# Patient Record
Sex: Female | Born: 1940
Health system: Southern US, Community
[De-identification: ages and names within clinical notes are randomized; demographics above are authoritative.]

## PROBLEM LIST (undated history)

## (undated) DIAGNOSIS — J181 Lobar pneumonia, unspecified organism: Secondary | ICD-10-CM

## (undated) DIAGNOSIS — C9190 Lymphoid leukemia, unspecified not having achieved remission: Secondary | ICD-10-CM

## (undated) DIAGNOSIS — K649 Unspecified hemorrhoids: Secondary | ICD-10-CM

## (undated) DIAGNOSIS — F329 Major depressive disorder, single episode, unspecified: Secondary | ICD-10-CM

## (undated) DIAGNOSIS — I10 Essential (primary) hypertension: Secondary | ICD-10-CM

## (undated) DIAGNOSIS — Z9221 Personal history of antineoplastic chemotherapy: Secondary | ICD-10-CM

## (undated) DIAGNOSIS — F419 Anxiety disorder, unspecified: Secondary | ICD-10-CM

## (undated) DIAGNOSIS — F32A Depression, unspecified: Secondary | ICD-10-CM

## (undated) DIAGNOSIS — K219 Gastro-esophageal reflux disease without esophagitis: Secondary | ICD-10-CM

## (undated) DIAGNOSIS — M8580 Other specified disorders of bone density and structure, unspecified site: Secondary | ICD-10-CM

## (undated) DIAGNOSIS — T7840XA Allergy, unspecified, initial encounter: Secondary | ICD-10-CM

## (undated) HISTORY — DX: Allergy, unspecified, initial encounter: T78.40XA

## (undated) HISTORY — PX: APPENDECTOMY: SHX54

## (undated) HISTORY — DX: Major depressive disorder, single episode, unspecified: F32.9

## (undated) HISTORY — PX: OOPHORECTOMY: SHX86

## (undated) HISTORY — PX: ABDOMINAL HYSTERECTOMY: SHX81

## (undated) HISTORY — DX: Gastro-esophageal reflux disease without esophagitis: K21.9

## (undated) HISTORY — DX: Other specified disorders of bone density and structure, unspecified site: M85.80

## (undated) HISTORY — DX: Essential (primary) hypertension: I10

## (undated) HISTORY — PX: COLONOSCOPY: SHX174

## (undated) HISTORY — PX: BREAST BIOPSY: SHX20

## (undated) HISTORY — DX: Lymphoid leukemia, unspecified not having achieved remission: C91.90

## (undated) HISTORY — PX: SPINE SURGERY: SHX786

## (undated) HISTORY — DX: Lobar pneumonia, unspecified organism: J18.1

## (undated) HISTORY — PX: COLON SURGERY: SHX602

## (undated) HISTORY — DX: Depression, unspecified: F32.A

## (undated) HISTORY — DX: Unspecified hemorrhoids: K64.9

---

## 2003-10-23 ENCOUNTER — Ambulatory Visit (HOSPITAL_COMMUNITY): Admission: RE | Admit: 2003-10-23 | Discharge: 2003-10-24 | Payer: Self-pay | Admitting: Neurosurgery

## 2004-04-14 ENCOUNTER — Ambulatory Visit: Payer: Self-pay | Admitting: Internal Medicine

## 2004-04-17 ENCOUNTER — Ambulatory Visit: Payer: Self-pay | Admitting: Internal Medicine

## 2004-05-07 ENCOUNTER — Ambulatory Visit: Payer: Self-pay | Admitting: Internal Medicine

## 2004-06-18 ENCOUNTER — Ambulatory Visit: Payer: Self-pay | Admitting: Oncology

## 2004-06-22 ENCOUNTER — Ambulatory Visit: Payer: Self-pay | Admitting: Oncology

## 2004-09-23 ENCOUNTER — Inpatient Hospital Stay: Payer: Self-pay | Admitting: General Surgery

## 2005-03-18 ENCOUNTER — Ambulatory Visit: Payer: Self-pay | Admitting: Oncology

## 2005-03-25 ENCOUNTER — Ambulatory Visit: Payer: Self-pay | Admitting: Oncology

## 2005-10-01 ENCOUNTER — Ambulatory Visit: Payer: Self-pay | Admitting: General Surgery

## 2005-12-13 ENCOUNTER — Ambulatory Visit: Payer: Self-pay | Admitting: Family Medicine

## 2005-12-14 ENCOUNTER — Ambulatory Visit: Payer: Self-pay | Admitting: Family Medicine

## 2005-12-23 ENCOUNTER — Ambulatory Visit: Payer: Self-pay | Admitting: Oncology

## 2006-01-22 ENCOUNTER — Ambulatory Visit: Payer: Self-pay | Admitting: Oncology

## 2006-09-23 ENCOUNTER — Ambulatory Visit: Payer: Self-pay | Admitting: Oncology

## 2006-10-05 ENCOUNTER — Ambulatory Visit: Payer: Self-pay | Admitting: Oncology

## 2006-10-17 ENCOUNTER — Emergency Department: Payer: Self-pay | Admitting: Emergency Medicine

## 2006-10-24 ENCOUNTER — Ambulatory Visit: Payer: Self-pay | Admitting: Oncology

## 2006-11-10 ENCOUNTER — Ambulatory Visit: Payer: Self-pay | Admitting: Gastroenterology

## 2007-05-24 ENCOUNTER — Ambulatory Visit: Payer: Self-pay | Admitting: Oncology

## 2007-06-19 ENCOUNTER — Ambulatory Visit: Payer: Self-pay | Admitting: Oncology

## 2007-06-23 ENCOUNTER — Ambulatory Visit: Payer: Self-pay | Admitting: Oncology

## 2007-12-01 ENCOUNTER — Ambulatory Visit: Payer: Self-pay | Admitting: General Surgery

## 2008-02-23 ENCOUNTER — Ambulatory Visit: Payer: Self-pay | Admitting: Oncology

## 2008-03-19 ENCOUNTER — Ambulatory Visit: Payer: Self-pay | Admitting: Oncology

## 2008-03-25 ENCOUNTER — Ambulatory Visit: Payer: Self-pay | Admitting: Oncology

## 2008-11-22 ENCOUNTER — Ambulatory Visit: Payer: Self-pay | Admitting: Oncology

## 2008-12-03 ENCOUNTER — Ambulatory Visit: Payer: Self-pay | Admitting: Oncology

## 2008-12-23 ENCOUNTER — Ambulatory Visit: Payer: Self-pay | Admitting: Oncology

## 2009-07-23 ENCOUNTER — Ambulatory Visit: Payer: Self-pay | Admitting: Oncology

## 2009-08-12 ENCOUNTER — Ambulatory Visit: Payer: Self-pay | Admitting: Oncology

## 2009-08-22 ENCOUNTER — Ambulatory Visit: Payer: Self-pay | Admitting: Oncology

## 2009-09-22 ENCOUNTER — Ambulatory Visit: Payer: Self-pay | Admitting: Oncology

## 2009-11-13 ENCOUNTER — Ambulatory Visit: Payer: Self-pay | Admitting: Family Medicine

## 2009-11-27 ENCOUNTER — Ambulatory Visit: Payer: Self-pay | Admitting: Family Medicine

## 2010-05-20 ENCOUNTER — Ambulatory Visit: Payer: Self-pay | Admitting: Oncology

## 2010-05-24 ENCOUNTER — Ambulatory Visit: Payer: Self-pay | Admitting: Oncology

## 2010-12-03 ENCOUNTER — Ambulatory Visit: Payer: Self-pay | Admitting: Family Medicine

## 2011-05-20 ENCOUNTER — Ambulatory Visit: Payer: Self-pay | Admitting: Oncology

## 2011-06-16 ENCOUNTER — Ambulatory Visit: Payer: Self-pay | Admitting: Family Medicine

## 2011-06-16 ENCOUNTER — Emergency Department: Payer: Self-pay | Admitting: Emergency Medicine

## 2011-06-16 LAB — DIFFERENTIAL
Basophil #: 0 10*3/uL (ref 0.0–0.1)
Basophil %: 0.2 %
Comment - H1-Com1: NORMAL
Comment - H1-Com2: NORMAL
Eosinophil #: 0 10*3/uL (ref 0.0–0.7)
Eosinophil %: 0.1 %
Eosinophil: 1 %
Lymphocyte #: 23 10*3/uL — ABNORMAL HIGH (ref 1.0–3.6)
Lymphocyte %: 76.1 %
Lymphocytes: 45 %
Monocyte #: 0.5 x10 3/mm (ref 0.2–0.9)
Monocyte %: 1.8 %
Monocytes: 6 %
Neutrophil #: 6.6 10*3/uL — ABNORMAL HIGH (ref 1.4–6.5)
Neutrophil %: 21.8 %
Segmented Neutrophils: 24 %
Variant Lymphocyte - H1-Rlymph: 24 %

## 2011-06-16 LAB — COMPREHENSIVE METABOLIC PANEL
Albumin: 3.8 g/dL (ref 3.4–5.0)
Alkaline Phosphatase: 73 U/L (ref 50–136)
Anion Gap: 9 (ref 7–16)
BUN: 8 mg/dL (ref 7–18)
Bilirubin,Total: 0.5 mg/dL (ref 0.2–1.0)
Calcium, Total: 8.3 mg/dL — ABNORMAL LOW (ref 8.5–10.1)
Chloride: 110 mmol/L — ABNORMAL HIGH (ref 98–107)
Co2: 23 mmol/L (ref 21–32)
Creatinine: 0.6 mg/dL (ref 0.60–1.30)
EGFR (African American): >60
EGFR (Non-African Amer.): >60
Glucose: 94 mg/dL (ref 65–99)
Osmolality: 281 (ref 275–301)
Potassium: 4 mmol/L (ref 3.5–5.1)
SGOT(AST): 25 U/L (ref 15–37)
SGPT (ALT): 21 U/L
Sodium: 142 mmol/L (ref 136–145)
Total Protein: 6.1 g/dL — ABNORMAL LOW (ref 6.4–8.2)

## 2011-06-16 LAB — CBC
HCT: 48.8 % — ABNORMAL HIGH (ref 35.0–47.0)
HGB: 16.1 g/dL — ABNORMAL HIGH (ref 12.0–16.0)
MCH: 33.1 pg (ref 26.0–34.0)
MCHC: 32.9 g/dL (ref 32.0–36.0)
MCV: 101 fL — ABNORMAL HIGH (ref 80–100)
Platelet: 138 10*3/uL — ABNORMAL LOW (ref 150–440)
RBC: 4.86 10*6/uL (ref 3.80–5.20)
RDW: 12.7 % (ref 11.5–14.5)
WBC: 30.1 10*3/uL — ABNORMAL HIGH (ref 3.6–11.0)

## 2011-06-16 LAB — TROPONIN I: Troponin-I: <0.02 ng/mL

## 2011-06-18 ENCOUNTER — Ambulatory Visit: Payer: Self-pay | Admitting: Oncology

## 2011-06-18 LAB — CBC CANCER CENTER
Basophil #: 0.1 x10 3/mm (ref 0.0–0.1)
Basophil %: 0.4 %
Eosinophil #: 0.1 x10 3/mm (ref 0.0–0.7)
Eosinophil %: 0.2 %
HCT: 45.1 % (ref 35.0–47.0)
HGB: 15.1 g/dL (ref 12.0–16.0)
Lymphocyte #: 24.1 x10 3/mm — ABNORMAL HIGH (ref 1.0–3.6)
Lymphocyte %: 73.5 %
MCH: 33.5 pg (ref 26.0–34.0)
MCHC: 33.5 g/dL (ref 32.0–36.0)
MCV: 100 fL (ref 80–100)
Monocyte #: 0.7 x10 3/mm (ref 0.2–0.9)
Monocyte %: 2.2 %
Neutrophil #: 7.8 x10 3/mm — ABNORMAL HIGH (ref 1.4–6.5)
Neutrophil %: 23.7 %
Platelet: 148 x10 3/mm — ABNORMAL LOW (ref 150–440)
RBC: 4.51 10*6/uL (ref 3.80–5.20)
RDW: 12.8 % (ref 11.5–14.5)
WBC: 32.8 x10 3/mm — ABNORMAL HIGH (ref 3.6–11.0)

## 2011-06-18 LAB — COMPREHENSIVE METABOLIC PANEL
Albumin: 4.3 g/dL (ref 3.4–5.0)
Alkaline Phosphatase: 103 U/L (ref 50–136)
Anion Gap: 7 (ref 7–16)
BUN: 11 mg/dL (ref 7–18)
Bilirubin,Total: 0.9 mg/dL (ref 0.2–1.0)
Calcium, Total: 9.6 mg/dL (ref 8.5–10.1)
Chloride: 100 mmol/L (ref 98–107)
Co2: 32 mmol/L (ref 21–32)
Creatinine: 0.88 mg/dL (ref 0.60–1.30)
EGFR (African American): >60
EGFR (Non-African Amer.): >60
Glucose: 119 mg/dL — ABNORMAL HIGH (ref 65–99)
Osmolality: 278 (ref 275–301)
Potassium: 4.2 mmol/L (ref 3.5–5.1)
SGOT(AST): 15 U/L (ref 15–37)
SGPT (ALT): 23 U/L
Sodium: 139 mmol/L (ref 136–145)
Total Protein: 6.8 g/dL (ref 6.4–8.2)

## 2011-06-23 ENCOUNTER — Ambulatory Visit: Payer: Self-pay | Admitting: Oncology

## 2011-12-09 ENCOUNTER — Ambulatory Visit: Payer: Self-pay | Admitting: Family Medicine

## 2012-05-28 ENCOUNTER — Ambulatory Visit: Payer: Self-pay

## 2012-05-28 LAB — RAPID INFLUENZA A&B ANTIGENS

## 2012-05-28 LAB — RAPID STREP-A WITH REFLX: Micro Text Report: NEGATIVE

## 2012-05-31 LAB — BETA STREP CULTURE(ARMC)

## 2012-07-14 ENCOUNTER — Ambulatory Visit: Payer: Self-pay | Admitting: Oncology

## 2012-07-14 LAB — COMPREHENSIVE METABOLIC PANEL
Albumin: 4 g/dL (ref 3.4–5.0)
Alkaline Phosphatase: 90 U/L (ref 50–136)
Anion Gap: 9 (ref 7–16)
BUN: 14 mg/dL (ref 7–18)
Bilirubin,Total: 0.6 mg/dL (ref 0.2–1.0)
Calcium, Total: 8.9 mg/dL (ref 8.5–10.1)
Chloride: 105 mmol/L (ref 98–107)
Co2: 28 mmol/L (ref 21–32)
Creatinine: 0.8 mg/dL (ref 0.60–1.30)
EGFR (African American): >60
EGFR (Non-African Amer.): >60
Glucose: 106 mg/dL — ABNORMAL HIGH (ref 65–99)
Osmolality: 284 (ref 275–301)
Potassium: 4.4 mmol/L (ref 3.5–5.1)
SGOT(AST): 16 U/L (ref 15–37)
SGPT (ALT): 24 U/L (ref 12–78)
Sodium: 142 mmol/L (ref 136–145)
Total Protein: 6.4 g/dL (ref 6.4–8.2)

## 2012-07-14 LAB — CBC CANCER CENTER
Basophil #: 0.1 x10 3/mm (ref 0.0–0.1)
Basophil %: 0.5 %
Eosinophil #: 0.2 x10 3/mm (ref 0.0–0.7)
Eosinophil %: 0.9 %
HCT: 45.2 % (ref 35.0–47.0)
HGB: 15.1 g/dL (ref 12.0–16.0)
Lymphocyte #: 19.7 x10 3/mm — ABNORMAL HIGH (ref 1.0–3.6)
Lymphocyte %: 86.2 %
MCH: 32.7 pg (ref 26.0–34.0)
MCHC: 33.4 g/dL (ref 32.0–36.0)
MCV: 98 fL (ref 80–100)
Monocyte #: 0.1 x10 3/mm — ABNORMAL LOW (ref 0.2–0.9)
Monocyte %: 0.4 %
Neutrophil #: 2.7 x10 3/mm (ref 1.4–6.5)
Neutrophil %: 12 %
Platelet: 151 x10 3/mm (ref 150–440)
RBC: 4.61 10*6/uL (ref 3.80–5.20)
RDW: 12.6 % (ref 11.5–14.5)
WBC: 22.8 x10 3/mm — ABNORMAL HIGH (ref 3.6–11.0)

## 2012-07-23 ENCOUNTER — Ambulatory Visit: Payer: Self-pay | Admitting: Oncology

## 2012-12-15 ENCOUNTER — Ambulatory Visit: Payer: Self-pay | Admitting: Family Medicine

## 2013-03-12 ENCOUNTER — Ambulatory Visit: Payer: Self-pay | Admitting: Physician Assistant

## 2013-07-18 ENCOUNTER — Ambulatory Visit: Payer: Self-pay | Admitting: Oncology

## 2013-08-08 ENCOUNTER — Ambulatory Visit: Payer: Self-pay | Admitting: Physician Assistant

## 2013-08-08 LAB — RAPID STREP-A WITH REFLX: Micro Text Report: NEGATIVE

## 2013-08-10 ENCOUNTER — Ambulatory Visit: Payer: Self-pay | Admitting: Oncology

## 2013-08-10 LAB — CBC CANCER CENTER
Basophil #: 0.1 x10 3/mm (ref 0.0–0.1)
Basophil %: 0.4 %
Eosinophil #: 0.2 x10 3/mm (ref 0.0–0.7)
Eosinophil %: 1 %
HCT: 45.4 % (ref 35.0–47.0)
HGB: 15.1 g/dL (ref 12.0–16.0)
Lymphocyte #: 17.9 x10 3/mm — ABNORMAL HIGH (ref 1.0–3.6)
Lymphocyte %: 80.5 %
MCH: 33.4 pg (ref 26.0–34.0)
MCHC: 33.3 g/dL (ref 32.0–36.0)
MCV: 100 fL (ref 80–100)
Monocyte #: 0.6 x10 3/mm (ref 0.2–0.9)
Monocyte %: 2.8 %
Neutrophil #: 3.4 x10 3/mm (ref 1.4–6.5)
Neutrophil %: 15.3 %
Platelet: 148 x10 3/mm — ABNORMAL LOW (ref 150–440)
RBC: 4.53 10*6/uL (ref 3.80–5.20)
RDW: 12.6 % (ref 11.5–14.5)
WBC: 22.3 x10 3/mm — ABNORMAL HIGH (ref 3.6–11.0)

## 2013-08-10 LAB — COMPREHENSIVE METABOLIC PANEL
Albumin: 3.9 g/dL (ref 3.4–5.0)
Alkaline Phosphatase: 93 U/L
Anion Gap: 6 — ABNORMAL LOW (ref 7–16)
BUN: 12 mg/dL (ref 7–18)
Bilirubin,Total: 0.6 mg/dL (ref 0.2–1.0)
Calcium, Total: 9.2 mg/dL (ref 8.5–10.1)
Chloride: 102 mmol/L (ref 98–107)
Co2: 33 mmol/L — ABNORMAL HIGH (ref 21–32)
Creatinine: 0.95 mg/dL (ref 0.60–1.30)
EGFR (African American): >60
EGFR (Non-African Amer.): 60 — ABNORMAL LOW
Glucose: 110 mg/dL — ABNORMAL HIGH (ref 65–99)
Osmolality: 282 (ref 275–301)
Potassium: 4.1 mmol/L (ref 3.5–5.1)
SGOT(AST): 19 U/L (ref 15–37)
SGPT (ALT): 24 U/L (ref 12–78)
Sodium: 141 mmol/L (ref 136–145)
Total Protein: 6.8 g/dL (ref 6.4–8.2)

## 2013-08-10 LAB — BETA STREP CULTURE(ARMC)

## 2013-08-22 ENCOUNTER — Ambulatory Visit: Payer: Self-pay | Admitting: Oncology

## 2013-12-18 ENCOUNTER — Ambulatory Visit: Payer: Self-pay | Admitting: Family Medicine

## 2013-12-27 ENCOUNTER — Ambulatory Visit: Payer: Self-pay | Admitting: Family Medicine

## 2014-08-06 ENCOUNTER — Other Ambulatory Visit: Payer: Self-pay | Admitting: *Deleted

## 2014-08-06 DIAGNOSIS — C9110 Chronic lymphocytic leukemia of B-cell type not having achieved remission: Secondary | ICD-10-CM

## 2014-08-09 ENCOUNTER — Ambulatory Visit (HOSPITAL_BASED_OUTPATIENT_CLINIC_OR_DEPARTMENT_OTHER): Payer: Medicare HMO | Admitting: Oncology

## 2014-08-09 ENCOUNTER — Inpatient Hospital Stay: Payer: Medicare HMO | Attending: Oncology

## 2014-08-09 ENCOUNTER — Other Ambulatory Visit: Payer: Self-pay

## 2014-08-09 VITALS — BP 140/65 | HR 84 | Temp 96.3°F | Resp 18 | Wt 176.2 lb

## 2014-08-09 DIAGNOSIS — F17210 Nicotine dependence, cigarettes, uncomplicated: Secondary | ICD-10-CM | POA: Insufficient documentation

## 2014-08-09 DIAGNOSIS — C9110 Chronic lymphocytic leukemia of B-cell type not having achieved remission: Secondary | ICD-10-CM | POA: Insufficient documentation

## 2014-08-09 DIAGNOSIS — K219 Gastro-esophageal reflux disease without esophagitis: Secondary | ICD-10-CM | POA: Diagnosis not present

## 2014-08-09 DIAGNOSIS — M8580 Other specified disorders of bone density and structure, unspecified site: Secondary | ICD-10-CM

## 2014-08-09 DIAGNOSIS — Z79899 Other long term (current) drug therapy: Secondary | ICD-10-CM | POA: Insufficient documentation

## 2014-08-09 DIAGNOSIS — I10 Essential (primary) hypertension: Secondary | ICD-10-CM | POA: Diagnosis not present

## 2014-08-09 LAB — CBC WITH DIFFERENTIAL/PLATELET
Basophils Absolute: 0.2 10*3/uL — ABNORMAL HIGH (ref 0–0.1)
Basophils Relative: 0 %
Eosinophils Absolute: 0.2 10*3/uL (ref 0–0.7)
Eosinophils Relative: 1 %
HCT: 47 % (ref 35.0–47.0)
Hemoglobin: 15.8 g/dL (ref 12.0–16.0)
Lymphocytes Relative: 90 %
Lymphs Abs: 36.3 10*3/uL — ABNORMAL HIGH (ref 1.0–3.6)
MCH: 32.9 pg (ref 26.0–34.0)
MCHC: 33.6 g/dL (ref 32.0–36.0)
MCV: 98 fL (ref 80.0–100.0)
Monocytes Absolute: 0.3 10*3/uL (ref 0.2–0.9)
Monocytes Relative: 1 %
Neutro Abs: 3.1 10*3/uL (ref 1.4–6.5)
Neutrophils Relative %: 8 %
Platelets: 138 10*3/uL — ABNORMAL LOW (ref 150–440)
RBC: 4.8 MIL/uL (ref 3.80–5.20)
RDW: 12.9 % (ref 11.5–14.5)
WBC: 40.1 10*3/uL — ABNORMAL HIGH (ref 3.6–11.0)

## 2014-08-09 LAB — COMPREHENSIVE METABOLIC PANEL
ALT: 16 U/L (ref 14–54)
AST: 22 U/L (ref 15–41)
Albumin: 4.8 g/dL (ref 3.5–5.0)
Alkaline Phosphatase: 92 U/L (ref 38–126)
Anion gap: 7 (ref 5–15)
BUN: 14 mg/dL (ref 6–20)
CO2: 31 mmol/L (ref 22–32)
Calcium: 9.7 mg/dL (ref 8.9–10.3)
Chloride: 103 mmol/L (ref 101–111)
Creatinine, Ser: 0.85 mg/dL (ref 0.44–1.00)
GFR calc Af Amer: >60 mL/min (ref >60)
GFR calc non Af Amer: >60 mL/min (ref >60)
Glucose, Bld: 123 mg/dL — ABNORMAL HIGH (ref 65–99)
Potassium: 4.4 mmol/L (ref 3.5–5.1)
Sodium: 141 mmol/L (ref 135–145)
Total Bilirubin: 1 mg/dL (ref 0.3–1.2)
Total Protein: 7 g/dL (ref 6.5–8.1)

## 2014-08-09 LAB — LACTATE DEHYDROGENASE: LDH: 152 U/L (ref 98–192)

## 2014-08-19 ENCOUNTER — Other Ambulatory Visit: Payer: Self-pay

## 2014-08-19 ENCOUNTER — Ambulatory Visit: Payer: Self-pay | Admitting: Oncology

## 2014-08-26 NOTE — Progress Notes (Signed)
[place]  [phone] [phone]) [phone]  ID: Jakyria Bleau OB: [birth year]  MR#: [id number]  AOZ#:[id number]  Patient Care Team: [name], MD as PCP - Family Medicine (Family Medicine)  CHIEF COMPLAINT:  Chief Complaint  Patient presents with  . Follow-up    CLL     INTERVAL HISTORY: Patient returns to clinic today for routine yearly follow-up and laboratory work. She continues to feel well and remains asymptomatic. She denies any recent fevers or illnesses. She has a good appetite and denies weight loss. She denies any night sweats. She has no chest pain or shortness of breath. She denies any nausea, vomiting, constipation, or diarrhea. Patient feels at her baseline and offers no specific complaints today.  REVIEW OF SYSTEMS:   Review of Systems  Constitutional: Negative for fever, weight loss and diaphoresis.  Respiratory: Negative.   Cardiovascular: Negative.     As per HPI. Otherwise, a complete review of systems is negatve.  PAST MEDICAL HISTORY: Past Medical History  Diagnosis Date  . Leukemia, lymphoid   . GERD (gastroesophageal reflux disease)   . Hypertension   . Allergy   . Depression   . Osteopenia   . Lobar pneumonia     PAST SURGICAL HISTORY: Past Surgical History  Procedure Laterality Date  . Appendectomy    . Abdominal hysterectomy    . Colon surgery      sigmoid resection  . Spine surgery      L4-5    FAMILY HISTORY Family History  Problem Relation Age of Onset  . Osteoporosis Mother   . Hypertension Father   . Heart attack Father   . Stroke Maternal Grandfather        ADVANCED DIRECTIVES:    HEALTH MAINTENANCE: History  Substance Use Topics  . Smoking status: Current Every Day Smoker    Types: Cigarettes  . Smokeless tobacco: Not on file  . Alcohol Use: No     Colonoscopy:  PAP:  Bone density:  Lipid panel:  Allergies  Allergen Reactions  . Augmentin [Amoxicillin-Pot  Clavulanate] Swelling    tongue  . Biaxin [Clarithromycin] Swelling and Rash    tongue    Current Outpatient Prescriptions  Medication Sig Dispense Refill  . albuterol (PROVENTIL HFA;VENTOLIN HFA) 108 (90 BASE) MCG/ACT inhaler Inhale 2 puffs into the lungs every 6 (six) hours as needed for wheezing or shortness of breath.    . benazepril (LOTENSIN) 40 MG tablet Take 40 mg by mouth daily.    Marland Kitchen LORazepam (ATIVAN) 1 MG tablet Take 1 mg by mouth 2 (two) times daily as needed for anxiety.    Marland Kitchen omeprazole (PRILOSEC) 20 MG capsule Take 20 mg by mouth daily as needed.    Marland Kitchen PARoxetine (PAXIL) 30 MG tablet Take 30 mg by mouth daily.    . Triamcinolone Acetonide (NASACORT AQ NA) Place into the nose daily.     No current facility-administered medications for this visit.    OBJECTIVE: Filed Vitals:   08/09/14 1139  BP: 140/65  Pulse: 84  Temp: 96.3 F (35.7 C)  Resp: 18     Body mass index is 27.58 kg/(m^2).    ECOG FS:0 - Asymptomatic  General: Well-developed, well-nourished, no acute distress. Eyes: anicteric sclera. Lungs: Clear to auscultation bilaterally. Heart: Regular rate and rhythm. No rubs, murmurs, or gallops. Abdomen: Soft, nontender, nondistended. No organomegaly noted, normoactive bowel sounds. Musculoskeletal: No edema, cyanosis, or clubbing. Neuro: Alert, answering all questions appropriately. Cranial nerves grossly  intact. Skin: No rashes or petechiae noted. Psych: Normal affect. Lymphatics: No cervical, calvicular, axillary or inguinal LAD.   LAB RESULTS:  Lab Results  Component Value Date   NA 141 08/09/2014   K 4.4 08/09/2014   CL 103 08/09/2014   CO2 31 08/09/2014   GLUCOSE 123* 08/09/2014   BUN 14 08/09/2014   CREATININE 0.85 08/09/2014   CALCIUM 9.7 08/09/2014   PROT 7.0 08/09/2014   ALBUMIN 4.8 08/09/2014   AST 22 08/09/2014   ALT 16 08/09/2014   ALKPHOS 92 08/09/2014   BILITOT 1.0 08/09/2014   GFRNONAA >60 08/09/2014   GFRAA >60 08/09/2014     Lab Results  Component Value Date   WBC 40.1* 08/09/2014   NEUTROABS 3.1 08/09/2014   HGB 15.8 08/09/2014   HCT 47.0 08/09/2014   MCV 98.0 08/09/2014   PLT 138* 08/09/2014     STUDIES: No results found.  ASSESSMENT: CLL, Rai stage 0  PLAN:    1. CLL: Patient's white count remains elevated, but essentially unchanged. She continues to remain a stage 0. She does not require treatment at this time. Patient continues to requests minimal follow-up therefore will return to clinic in 1 year for repeat laboratory work and further evaluation.  Patient expressed understanding and was in agreement with this plan. She also understands that She can call clinic at any time with any questions, concerns, or complaints.   Lloyd Huger, MD   08/26/2014 2:12 PM

## 2014-09-22 ENCOUNTER — Ambulatory Visit
Admission: EM | Admit: 2014-09-22 | Discharge: 2014-09-22 | Disposition: A | Discharge status: Home / Self Care | Payer: Medicare HMO | Attending: Emergency Medicine | Admitting: Emergency Medicine

## 2014-09-22 ENCOUNTER — Encounter: Payer: Self-pay | Admitting: *Deleted

## 2014-09-22 ENCOUNTER — Ambulatory Visit: Payer: Medicare HMO

## 2014-09-22 DIAGNOSIS — F17210 Nicotine dependence, cigarettes, uncomplicated: Secondary | ICD-10-CM | POA: Diagnosis not present

## 2014-09-22 DIAGNOSIS — K219 Gastro-esophageal reflux disease without esophagitis: Secondary | ICD-10-CM | POA: Diagnosis not present

## 2014-09-22 DIAGNOSIS — R05 Cough: Secondary | ICD-10-CM | POA: Diagnosis present

## 2014-09-22 DIAGNOSIS — J40 Bronchitis, not specified as acute or chronic: Secondary | ICD-10-CM | POA: Diagnosis not present

## 2014-09-22 DIAGNOSIS — F329 Major depressive disorder, single episode, unspecified: Secondary | ICD-10-CM | POA: Diagnosis not present

## 2014-09-22 DIAGNOSIS — M8580 Other specified disorders of bone density and structure, unspecified site: Secondary | ICD-10-CM | POA: Insufficient documentation

## 2014-09-22 DIAGNOSIS — J069 Acute upper respiratory infection, unspecified: Secondary | ICD-10-CM | POA: Diagnosis not present

## 2014-09-22 DIAGNOSIS — I10 Essential (primary) hypertension: Secondary | ICD-10-CM | POA: Insufficient documentation

## 2014-09-22 MED ORDER — IPRATROPIUM-ALBUTEROL 0.5-2.5 (3) MG/3ML IN SOLN
3.0000 mL | Freq: Once | RESPIRATORY_TRACT | Status: AC
  Administered 2014-09-22: 3 mL via RESPIRATORY_TRACT

## 2014-09-22 MED ORDER — PREDNISONE 50 MG PO TABS: ORAL_TABLET | ORAL | Status: DC

## 2014-09-22 MED ORDER — BENZONATATE 100 MG PO CAPS: 100.0000 mg | ORAL_CAPSULE | Freq: Three times a day (TID) | ORAL | Status: DC

## 2014-09-22 MED ORDER — ALBUTEROL SULFATE HFA 108 (90 BASE) MCG/ACT IN AERS: 1.0000 | INHALATION_SPRAY | Freq: Four times a day (QID) | RESPIRATORY_TRACT | Status: DC | PRN

## 2014-09-22 NOTE — Discharge Instructions (Signed)
Take two puffs from your albuterol inhaler every 4-6 hours. Finish the steroids unless your doctor tells you to stop. Start taking some plain guaifenisin/robitussin o keep the mucus thin so that you can cough it up Make sure you drink extra fluids. Return if you get worse, have a fever >100.4, or any other concerns.   Go to www.goodrx.com to look up your medications. This will give you a list of where you can find your prescriptions at the most affordable prices.

## 2014-09-22 NOTE — ED Provider Notes (Signed)
HPI  SUBJECTIVE:  Katherine [name] is a 74 y.o. female who presents with cough productive of whitish sputum, wheezing, rhinorrhea, postnasal drip, fatigue, malaise, feeling feverish with chills for 3 days. She has no documented fevers. She states that she is sleeping through the night without waking up coughing or short of breath.. There are no aggravating or alleviating factors. She tried Allegra-D one day, Benadryl the other day and increasing her fluids. No sore throat, ear pain, sinus pain/pressure, chest pain, shortness of breath, vomiting, body aches, rash. No sick contacts. No itchy, watery eyes, sneezing. No lower extremity edema, nocturia. She states that she has a history of seasonal allergies, but that this feels different. She is a smoker one pack a day for for over 10 years.  Past medical history of hypertension, reflux, tobacco use, leukemia in remission per patient, pneumonia, seasonal allergies. No history of diabetes,  CHF, asthma, emphysema, COPD. patient states that she does not have an albuterol inhaler at home.    Past Medical History  Diagnosis Date  . Leukemia, lymphoid   . GERD (gastroesophageal reflux disease)   . Hypertension   . Allergy   . Depression   . Osteopenia   . Lobar pneumonia     Past Surgical History  Procedure Laterality Date  . Appendectomy    . Abdominal hysterectomy    . Colon surgery      sigmoid resection  . Spine surgery      L4-5    Family History  Problem Relation Age of Onset  . Osteoporosis Mother   . Hypertension Father   . Heart attack Father   . Stroke Maternal Grandfather     History  Substance Use Topics  . Smoking status: Current Every Day Smoker    Types: Cigarettes  . Smokeless tobacco: Not on file  . Alcohol Use: No    No current facility-administered medications for this encounter.  Current outpatient prescriptions:  .  albuterol (PROVENTIL HFA;VENTOLIN HFA) 108 (90 BASE) MCG/ACT inhaler, Inhale 1-2 puffs  into the lungs every 6 (six) hours as needed for wheezing or shortness of breath. Dispense with aerochamber, Disp: 1 Inhaler, Rfl: 0 .  benazepril (LOTENSIN) 40 MG tablet, Take 40 mg by mouth daily., Disp: , Rfl:  .  benzonatate (TESSALON) 100 MG capsule, Take 1 capsule (100 mg total) by mouth every 8 (eight) hours., Disp: 21 capsule, Rfl: 0 .  LORazepam (ATIVAN) 1 MG tablet, Take 1 mg by mouth 2 (two) times daily as needed for anxiety., Disp: , Rfl:  .  omeprazole (PRILOSEC) 20 MG capsule, Take 20 mg by mouth daily as needed., Disp: , Rfl:  .  PARoxetine (PAXIL) 30 MG tablet, Take 30 mg by mouth daily., Disp: , Rfl:  .  predniSONE (DELTASONE) 50 MG tablet, 1 tab po on day 1, one tab po on day 2, 1/2 tab po on days 3 and 4, Disp: 3 tablet, Rfl: 0 .  Triamcinolone Acetonide (NASACORT AQ NA), Place into the nose daily., Disp: , Rfl:   Allergies  Allergen Reactions  . Augmentin [Amoxicillin-Pot Clavulanate] Swelling    tongue  . Biaxin [Clarithromycin] Swelling and Rash    tongue     ROS  As noted in HPI.   Physical Exam  BP 127/59 mmHg  Pulse 77  Temp(Src) 98.5 F (36.9 C)  Ht 5\' 7"  (1.702 m)  Wt 170 lb (77.111 kg)  BMI 26.62 kg/m2  SpO2 95%  Constitutional: Well developed, well nourished,  no acute distress Eyes: PERRL, EOMI, conjunctiva normal bilaterally TMs normal bilaterally. No sinus tenderness. mild nasal congestion. HENT: Normocephalic, atraumatic,mucus membranes moist Respiratory: Clear to auscultation bilaterally, no rales, no wheezing, no rhonchi Cardiovascular: Normal rate and rhythm, no murmurs, no gallops, no rubs GI: Soft, nondistended, normal bowel sounds, nontender, no rebound, no guarding Back: no CVAT skin: No rash, skin intact Musculoskeletal: No edema, no tenderness, no deformities Neurologic: Alert & oriented x 3, CN II-XII grossly intact, no motor deficits, sensation grossly intact Psychiatric: Speech and behavior appropriate   ED  Course   Medications  ipratropium-albuterol (DUONEB) 0.5-2.5 (3) MG/3ML nebulizer solution 3 mL (3 mLs Nebulization Given 09/22/14 0937)    Orders Placed This Encounter  Procedures  . DG Chest 2 View    Standing Status: Standing     Number of Occurrences: 1     Standing Expiration Date:     Order Specific Question:  Reason for Exam (SYMPTOM  OR DIAGNOSIS REQUIRED)    Answer:  cough r/o PNA     Comments:  r/o PNA   No results found for this or any previous visit (from the past 24 hour(s)). Dg Chest 2 View  09/22/2014   CLINICAL DATA:  Cough for 3 days.  Question pneumonia.  EXAM: CHEST  2 VIEW  COMPARISON:  12/27/2013  FINDINGS: Heart is normal size. Tortuosity of the thoracic aorta. Lungs are clear. No effusions. No acute bony abnormality.  IMPRESSION: No active cardiopulmonary disease.   Electronically Signed   By: Rolm Baptise M.D.   On: 09/22/2014 10:07   ED Clinical Impression  Bronchitis  URI (upper respiratory infection)   ED Assessment/Plan  Lungs clear on my exam, however, patient had some rhonchi in triage, so we'll give DuoNeb. Checking chest x-ray to rule out pneumonia. reviewed imaging independently. No pna. See radiology report for full details.  On reevaluation, patient states she feels slightly better. Lungs are still clear on repeat physical exam. Home with steroids, bronchodilators, Tessalon Perles, Mucinex, follow up with primary care physician or return here if not improving or getting worse. Discussed imaging, MDM, plan and followup with patient  Discussed sn/sx that should prompt return to the UC or ED. Patient agrees with plan.  *This clinic note was created using Dragon dictation software. Therefore, there may be occasional mistakes despite careful proofreading.  ?  Melynda Ripple, MD 09/22/14 1034

## 2014-09-22 NOTE — ED Notes (Signed)
Fever, cough and congestion x 3 days.  Few scattered rhonchi with inspiration.  No audible wheeezing.  Clear mucus.

## 2015-01-06 ENCOUNTER — Ambulatory Visit (INDEPENDENT_AMBULATORY_CARE_PROVIDER_SITE_OTHER): Payer: Medicare HMO | Admitting: Family Medicine

## 2015-01-06 ENCOUNTER — Other Ambulatory Visit: Payer: Self-pay | Admitting: Family Medicine

## 2015-01-06 ENCOUNTER — Encounter: Payer: Self-pay | Admitting: Family Medicine

## 2015-01-06 VITALS — BP 120/72 | HR 73 | Temp 98.9°F | Ht 66.6 in | Wt 171.0 lb

## 2015-01-06 DIAGNOSIS — I10 Essential (primary) hypertension: Secondary | ICD-10-CM

## 2015-01-06 DIAGNOSIS — Z0000 Encounter for general adult medical examination without abnormal findings: Secondary | ICD-10-CM

## 2015-01-06 DIAGNOSIS — Z23 Encounter for immunization: Secondary | ICD-10-CM | POA: Diagnosis not present

## 2015-01-06 DIAGNOSIS — C9110 Chronic lymphocytic leukemia of B-cell type not having achieved remission: Secondary | ICD-10-CM

## 2015-01-06 DIAGNOSIS — D692 Other nonthrombocytopenic purpura: Secondary | ICD-10-CM

## 2015-01-06 DIAGNOSIS — R69 Illness, unspecified: Secondary | ICD-10-CM | POA: Diagnosis not present

## 2015-01-06 DIAGNOSIS — Z1231 Encounter for screening mammogram for malignant neoplasm of breast: Secondary | ICD-10-CM

## 2015-01-06 DIAGNOSIS — F339 Major depressive disorder, recurrent, unspecified: Secondary | ICD-10-CM | POA: Insufficient documentation

## 2015-01-06 DIAGNOSIS — F329 Major depressive disorder, single episode, unspecified: Secondary | ICD-10-CM | POA: Diagnosis not present

## 2015-01-06 DIAGNOSIS — F32A Depression, unspecified: Secondary | ICD-10-CM

## 2015-01-06 DIAGNOSIS — L989 Disorder of the skin and subcutaneous tissue, unspecified: Secondary | ICD-10-CM

## 2015-01-06 LAB — URINALYSIS, ROUTINE W REFLEX MICROSCOPIC
Bilirubin, UA: NEGATIVE
Glucose, UA: NEGATIVE
Ketones, UA: NEGATIVE
Leukocytes, UA: NEGATIVE
Nitrite, UA: NEGATIVE
Protein, UA: NEGATIVE
RBC, UA: NEGATIVE
Specific Gravity, UA: 1.02 (ref 1.005–1.030)
Urobilinogen, Ur: 0.2 mg/dL (ref 0.2–1.0)
pH, UA: 6 (ref 5.0–7.5)

## 2015-01-06 MED ORDER — BENAZEPRIL HCL 40 MG PO TABS: 40.0000 mg | ORAL_TABLET | Freq: Every day | ORAL | Status: DC

## 2015-01-06 MED ORDER — PAROXETINE HCL 30 MG PO TABS: 30.0000 mg | ORAL_TABLET | Freq: Every day | ORAL | Status: DC

## 2015-01-06 MED ORDER — OMEPRAZOLE 20 MG PO CPDR: 20.0000 mg | DELAYED_RELEASE_CAPSULE | Freq: Every day | ORAL | Status: DC | PRN

## 2015-01-06 MED ORDER — LORAZEPAM 1 MG PO TABS: 0.5000 mg | ORAL_TABLET | Freq: Two times a day (BID) | ORAL | Status: DC | PRN

## 2015-01-06 NOTE — Assessment & Plan Note (Signed)
The current medical regimen is effective;  continue present plan and medications.  

## 2015-01-06 NOTE — Assessment & Plan Note (Signed)
Followed by hematology 

## 2015-01-06 NOTE — Progress Notes (Signed)
BP 120/72 mmHg  Pulse 73  Temp(Src) 98.9 F (37.2 C)  Ht 5' 6.6" (1.692 m)  Wt 171 lb (77.565 kg)  BMI 27.09 kg/m2  SpO2 98%   Subjective:    Patient ID: Katherine [name], female    DOB: Aug 25, 1940, 73 y.o.   MRN: [id number]  HPI: Katherine [name] is a 74 y.o. female  Chief Complaint  Patient presents with  . Annual Exam  rt leg inflamed nonhealing lesion occ scratches and bleeds Lasting all year Shingles spots are still sore on lt lat leg Chronic bruises on both arms Patient doing well follow-up hypertension complaints doing well on medication Anxiety depression stable using lorazepam 1 mg half a tablet morning and half in the evening Paxil still doing well with no complaints from medication   Relevant past medical, surgical, family and social history reviewed and updated as indicated. Interim medical history since our last visit reviewed. Allergies and medications reviewed and updated.  Review of Systems  Constitutional: Negative.   HENT: Negative.   Eyes: Negative.   Respiratory: Negative.   Cardiovascular: Negative.   Gastrointestinal: Negative.   Endocrine: Negative.   Genitourinary: Negative.   Musculoskeletal: Negative.   Skin: Negative.   Allergic/Immunologic: Negative.   Neurological: Negative.   Hematological: Negative.   Psychiatric/Behavioral: Negative.     Per HPI unless specifically indicated above     Objective:    BP 120/72 mmHg  Pulse 73  Temp(Src) 98.9 F (37.2 C)  Ht 5' 6.6" (1.692 m)  Wt 171 lb (77.565 kg)  BMI 27.09 kg/m2  SpO2 98%  Wt Readings from Last 3 Encounters:  01/06/15 171 lb (77.565 kg)  09/22/14 170 lb (77.111 kg)  08/09/14 176 lb 2.4 oz (79.901 kg)    Physical Exam  Constitutional: She is oriented to person, place, and time. She appears well-developed and well-nourished.  HENT:  Head: Normocephalic and atraumatic.  Right Ear: External ear normal.  Left Ear: External ear normal.  Nose: Nose normal.   Mouth/Throat: Oropharynx is clear and moist.  Eyes: Conjunctivae and EOM are normal. Pupils are equal, round, and reactive to light.  Neck: Normal range of motion. Neck supple. Carotid bruit is not present.  Cardiovascular: Normal rate, regular rhythm and normal heart sounds.   No murmur heard. Pulmonary/Chest: Effort normal and breath sounds normal. She exhibits no mass. Right breast exhibits no mass, no skin change and no tenderness. Left breast exhibits no mass, no skin change and no tenderness. Breasts are symmetrical.  Abdominal: Soft. Bowel sounds are normal. There is no hepatosplenomegaly.  Musculoskeletal: Normal range of motion.  Neurological: She is alert and oriented to person, place, and time.  Skin: No rash noted.  Senile purpura both arms  Psychiatric: She has a normal mood and affect. Her behavior is normal. Judgment and thought content normal.    Results for orders placed or performed in visit on 08/09/14  CBC with Differential/Platelet  Result Value Ref Range   WBC 40.1 (H) 3.6 - 11.0 K/uL   RBC 4.80 3.80 - 5.20 MIL/uL   Hemoglobin 15.8 12.0 - 16.0 g/dL   HCT 47.0 35.0 - 47.0 %   MCV 98.0 80.0 - 100.0 fL   MCH 32.9 26.0 - 34.0 pg   MCHC 33.6 32.0 - 36.0 g/dL   RDW 12.9 11.5 - 14.5 %   Platelets 138 (L) 150 - 440 K/uL   Neutrophils Relative % 8 %   Neutro Abs 3.1 1.4 - 6.5  K/uL   Lymphocytes Relative 90 %   Lymphs Abs 36.3 (H) 1.0 - 3.6 K/uL   Monocytes Relative 1 %   Monocytes Absolute 0.3 0.2 - 0.9 K/uL   Eosinophils Relative 1 %   Eosinophils Absolute 0.2 0 - 0.7 K/uL   Basophils Relative 0 %   Basophils Absolute 0.2 (H) 0 - 0.1 K/uL  Comprehensive metabolic panel  Result Value Ref Range   Sodium 141 135 - 145 mmol/L   Potassium 4.4 3.5 - 5.1 mmol/L   Chloride 103 101 - 111 mmol/L   CO2 31 22 - 32 mmol/L   Glucose, Bld 123 (H) 65 - 99 mg/dL   BUN 14 6 - 20 mg/dL   Creatinine, Ser 0.85 0.44 - 1.00 mg/dL   Calcium 9.7 8.9 - 10.3 mg/dL   Total Protein  7.0 6.5 - 8.1 g/dL   Albumin 4.8 3.5 - 5.0 g/dL   AST 22 15 - 41 U/L   ALT 16 14 - 54 U/L   Alkaline Phosphatase 92 38 - 126 U/L   Total Bilirubin 1.0 0.3 - 1.2 mg/dL   GFR calc non Af Amer >60 >60 mL/min   GFR calc Af Amer >60 >60 mL/min   Anion gap 7 5 - 15  Lactate dehydrogenase  Result Value Ref Range   LDH 152 98 - 192 U/L      Assessment & Plan:   Problem List Items Addressed This Visit      Cardiovascular and Mediastinum   Senile purpura (HCC)    Discussed nutrition      Relevant Medications   benazepril (LOTENSIN) 40 MG tablet   Essential hypertension    The current medical regimen is effective;  continue present plan and medications.       Relevant Medications   benazepril (LOTENSIN) 40 MG tablet   Other Relevant Orders   Comprehensive metabolic panel   Lipid panel   CBC with Differential/Platelet   Urinalysis, Routine w reflex microscopic (not at [facility])   TSH     Musculoskeletal and Integument   Skin lesion of right leg    Nonhealing skin lesion right leg occasionally bleeds will schedule shave biopsy        Other   Leukemia, lymphoid (HCC)    Followed by hematology      Relevant Medications   LORazepam (ATIVAN) 1 MG tablet   Other Relevant Orders   Comprehensive metabolic panel   Lipid panel   CBC with Differential/Platelet   Urinalysis, Routine w reflex microscopic (not at [facility])   TSH   Depression    The current medical regimen is effective;  continue present plan and medications.       Relevant Medications   LORazepam (ATIVAN) 1 MG tablet   PARoxetine (PAXIL) 30 MG tablet   Other Relevant Orders   Urinalysis, Routine w reflex microscopic (not at [facility])   TSH    Other Visit Diagnoses    Immunization due    -  Primary    Relevant Orders    Flu Vaccine QUAD 36+ mos PF IM (Fluarix & Fluzone Quad PF) (Completed)    PE (physical exam), annual            Follow up plan: Return for apt for shave bx leg lesion and f/u 6 mo reg visit,  BMP.

## 2015-01-06 NOTE — Assessment & Plan Note (Signed)
Nonhealing skin lesion right leg occasionally bleeds will schedule shave biopsy

## 2015-01-06 NOTE — Assessment & Plan Note (Signed)
Discussed nutrition.  

## 2015-01-07 ENCOUNTER — Telehealth: Payer: Self-pay | Admitting: Unknown Physician Specialty

## 2015-01-07 LAB — COMPREHENSIVE METABOLIC PANEL
ALT: 16 IU/L (ref 0–32)
AST: 18 IU/L (ref 0–40)
Albumin/Globulin Ratio: 3.1 — ABNORMAL HIGH (ref 1.1–2.5)
Albumin: 4.6 g/dL (ref 3.5–4.8)
Alkaline Phosphatase: 93 IU/L (ref 39–117)
BUN/Creatinine Ratio: 11 (ref 11–26)
BUN: 10 mg/dL (ref 8–27)
Bilirubin Total: 0.7 mg/dL (ref 0.0–1.2)
CO2: 25 mmol/L (ref 18–29)
Calcium: 9.4 mg/dL (ref 8.7–10.3)
Chloride: 102 mmol/L (ref 97–106)
Creatinine, Ser: 0.87 mg/dL (ref 0.57–1.00)
GFR calc Af Amer: 76 mL/min/{1.73_m2} (ref >59)
GFR calc non Af Amer: 66 mL/min/{1.73_m2} (ref >59)
Globulin, Total: 1.5 g/dL (ref 1.5–4.5)
Glucose: 101 mg/dL — ABNORMAL HIGH (ref 65–99)
Potassium: 4.7 mmol/L (ref 3.5–5.2)
Sodium: 141 mmol/L (ref 136–144)
Total Protein: 6.1 g/dL (ref 6.0–8.5)

## 2015-01-07 LAB — LIPID PANEL
Chol/HDL Ratio: 6.9 ratio units — ABNORMAL HIGH (ref 0.0–4.4)
Cholesterol, Total: 227 mg/dL — ABNORMAL HIGH (ref 100–199)
HDL: 33 mg/dL — ABNORMAL LOW (ref >39)
LDL Calculated: 148 mg/dL — ABNORMAL HIGH (ref 0–99)
Triglycerides: 232 mg/dL — ABNORMAL HIGH (ref 0–149)
VLDL Cholesterol Cal: 46 mg/dL — ABNORMAL HIGH (ref 5–40)

## 2015-01-07 LAB — CBC WITH DIFFERENTIAL/PLATELET
Basophils Absolute: 0.1 10*3/uL (ref 0.0–0.2)
Basos: 0 %
EOS (ABSOLUTE): 0.2 10*3/uL (ref 0.0–0.4)
Eos: 0 %
Hematocrit: 43.3 % (ref 34.0–46.6)
Hemoglobin: 14.7 g/dL (ref 11.1–15.9)
Immature Grans (Abs): 0 10*3/uL (ref 0.0–0.1)
Immature Granulocytes: 0 %
Lymphocytes Absolute: 58.2 10*3/uL — ABNORMAL HIGH (ref 0.7–3.1)
Lymphs: 93 %
MCH: 33.4 pg — ABNORMAL HIGH (ref 26.6–33.0)
MCHC: 33.9 g/dL (ref 31.5–35.7)
MCV: 98 fL — ABNORMAL HIGH (ref 79–97)
Monocytes Absolute: 0.7 10*3/uL (ref 0.1–0.9)
Monocytes: 1 %
Neutrophils Absolute: 3.6 10*3/uL (ref 1.4–7.0)
Neutrophils: 6 %
Platelets: 146 10*3/uL — ABNORMAL LOW (ref 150–379)
RBC: 4.4 x10E6/uL (ref 3.77–5.28)
RDW: 13.4 % (ref 12.3–15.4)
WBC: 62.9 10*3/uL (ref 3.4–10.8)

## 2015-01-07 LAB — TSH: TSH: 2.83 u[IU]/mL (ref 0.450–4.500)

## 2015-01-07 NOTE — Telephone Encounter (Signed)
Call pt 

## 2015-01-07 NOTE — Telephone Encounter (Signed)
Call from on call nurse about alert lab value of 61.4.  Reviewed the chart and pt followed by hematology for CLL.  Last WBC was 40.    Notify primary in the AM for further management.

## 2015-01-08 ENCOUNTER — Encounter: Payer: Self-pay | Admitting: Family Medicine

## 2015-01-09 ENCOUNTER — Ambulatory Visit: Payer: Medicare HMO

## 2015-01-14 ENCOUNTER — Encounter: Payer: Self-pay | Admitting: Family Medicine

## 2015-01-14 ENCOUNTER — Other Ambulatory Visit: Payer: Self-pay | Admitting: Family Medicine

## 2015-01-14 ENCOUNTER — Ambulatory Visit (INDEPENDENT_AMBULATORY_CARE_PROVIDER_SITE_OTHER): Payer: Medicare HMO | Admitting: Family Medicine

## 2015-01-14 VITALS — BP 131/79 | HR 86 | Temp 98.3°F | Wt 172.0 lb

## 2015-01-14 DIAGNOSIS — F329 Major depressive disorder, single episode, unspecified: Secondary | ICD-10-CM

## 2015-01-14 DIAGNOSIS — D485 Neoplasm of uncertain behavior of skin: Secondary | ICD-10-CM | POA: Diagnosis not present

## 2015-01-14 DIAGNOSIS — L989 Disorder of the skin and subcutaneous tissue, unspecified: Secondary | ICD-10-CM

## 2015-01-14 DIAGNOSIS — R69 Illness, unspecified: Secondary | ICD-10-CM | POA: Diagnosis not present

## 2015-01-14 DIAGNOSIS — F32A Depression, unspecified: Secondary | ICD-10-CM

## 2015-01-14 MED ORDER — LORAZEPAM 1 MG PO TABS: 0.5000 mg | ORAL_TABLET | Freq: Two times a day (BID) | ORAL | Status: DC | PRN

## 2015-01-14 NOTE — Progress Notes (Signed)
BP 131/79 mmHg  Pulse 86  Temp(Src) 98.3 F (36.8 C)  Wt 172 lb (78.019 kg)  SpO2 94%   Subjective:    Patient ID: Katherine [name], female    DOB: Jul 11, 1940, 74 y.o.   MRN: [id number]  HPI: Katherine [name] is a 74 y.o. female  Chief Complaint  Patient presents with  . shave biopsy    right thigh   Patient with nonhealing lesion present for over a year scratches bleeds states irritated maybe slightly larger  Patient also after discussion and message and back and forth with patient's hematologist at the [facility] where patient was to be contacted for follow-up appointment has not heard any thing about follow-up Patient will contact [place] regarding her appointment we need to help further she will notify us.  Relevant past medical, surgical, family and social history reviewed and updated as indicated. Interim medical history since our last visit reviewed. Allergies and medications reviewed and updated.  Review of Systems  Per HPI unless specifically indicated above     Objective:    BP 131/79 mmHg  Pulse 86  Temp(Src) 98.3 F (36.8 C)  Wt 172 lb (78.019 kg)  SpO2 94%  Wt Readings from Last 3 Encounters:  01/14/15 172 lb (78.019 kg)  01/06/15 171 lb (77.565 kg)  09/22/14 170 lb (77.111 kg)    Physical Exam  Skin:  Inflamed 4 mm skin lesion with central punctation Prepped with Betadine and alcohol and infiltrated with Xylocaine with epinephrine shave biopsy performed based destroyed was Surgitron unit Patient tolerated the procedure well patient education given on wound care.    Results for orders placed or performed in visit on 01/06/15  Comprehensive metabolic panel  Result Value Ref Range   Glucose 101 (H) 65 - 99 mg/dL   BUN 10 8 - 27 mg/dL   Creatinine, Ser 0.87 0.57 - 1.00 mg/dL   GFR calc non Af Amer 66 >59 mL/min/1.73   GFR calc Af Amer 76 >59 mL/min/1.73   BUN/Creatinine Ratio 11 11 - 26   Sodium 141 136 - 144 mmol/L   Potassium  4.7 3.5 - 5.2 mmol/L   Chloride 102 97 - 106 mmol/L   CO2 25 18 - 29 mmol/L   Calcium 9.4 8.7 - 10.3 mg/dL   Total Protein 6.1 6.0 - 8.5 g/dL   Albumin 4.6 3.5 - 4.8 g/dL   Globulin, Total 1.5 1.5 - 4.5 g/dL   Albumin/Globulin Ratio 3.1 (H) 1.1 - 2.5   Bilirubin Total 0.7 0.0 - 1.2 mg/dL   Alkaline Phosphatase 93 39 - 117 IU/L   AST 18 0 - 40 IU/L   ALT 16 0 - 32 IU/L  Lipid panel  Result Value Ref Range   Cholesterol, Total 227 (H) 100 - 199 mg/dL   Triglycerides 232 (H) 0 - 149 mg/dL   HDL 33 (L) >39 mg/dL   VLDL Cholesterol Cal 46 (H) 5 - 40 mg/dL   LDL Calculated 148 (H) 0 - 99 mg/dL   Chol/HDL Ratio 6.9 (H) 0.0 - 4.4 ratio units  CBC with Differential/Platelet  Result Value Ref Range   WBC 62.9 (>) 3.4 - 10.8 x10E3/uL   RBC 4.40 3.77 - 5.28 x10E6/uL   Hemoglobin 14.7 11.1 - 15.9 g/dL   Hematocrit 43.3 34.0 - 46.6 %   MCV 98 (H) 79 - 97 fL   MCH 33.4 (H) 26.6 - 33.0 pg   MCHC 33.9 31.5 - 35.7 g/dL  RDW 13.4 12.3 - 15.4 %   Platelets 146 (L) 150 - 379 x10E3/uL   Neutrophils 6 %   Lymphs 93 %   Monocytes 1 %   Eos 0 %   Basos 0 %   Neutrophils Absolute 3.6 1.4 - 7.0 x10E3/uL   Lymphocytes Absolute 58.2 (H) 0.7 - 3.1 x10E3/uL   Monocytes Absolute 0.7 0.1 - 0.9 x10E3/uL   EOS (ABSOLUTE) 0.2 0.0 - 0.4 x10E3/uL   Basophils Absolute 0.1 0.0 - 0.2 x10E3/uL   Immature Granulocytes 0 %   Immature Grans (Abs) 0.0 0.0 - 0.1 x10E3/uL   Hematology Comments: Note:   Urinalysis, Routine w reflex microscopic (not at [facility])  Result Value Ref Range   Specific Gravity, UA 1.020 1.005 - 1.030   pH, UA 6.0 5.0 - 7.5   Color, UA Yellow Yellow   Appearance Ur Clear Clear   Leukocytes, UA Negative Negative   Protein, UA Negative Negative/Trace   Glucose, UA Negative Negative   Ketones, UA Negative Negative   RBC, UA Negative Negative   Bilirubin, UA Negative Negative   Urobilinogen, Ur 0.2 0.2 - 1.0 mg/dL   Nitrite, UA Negative Negative  TSH  Result Value Ref Range   TSH 2.830  0.450 - 4.500 uIU/mL      Assessment & Plan:   Problem List Items Addressed This Visit      Musculoskeletal and Integument   Skin lesion of right leg    Shave biopsy pending      Relevant Orders   Pathology     Other   Depression - Primary   Relevant Medications   LORazepam (ATIVAN) 1 MG tablet     this is a copy of the note sent to Dr. Grayland Ormond Ms. Katherine [name] lives in [place] and just got a notice from the [employer] system that she has Trihalomethanes of 0.080mg /L In her water supply. She currently is being treated by you for CLL. I am not sure what to do with this information. If you would follow this up with the patient as needed that would be appreciated. Thanks [employer]   Follow up plan: No Follow-up on file.

## 2015-01-14 NOTE — Assessment & Plan Note (Signed)
Shave biopsy pending

## 2015-01-20 LAB — PATHOLOGY

## 2015-01-21 ENCOUNTER — Ambulatory Visit
Admission: RE | Admit: 2015-01-21 | Discharge: 2015-01-21 | Disposition: A | Discharge status: Home / Self Care | Payer: Medicare HMO | Source: Ambulatory Visit | Attending: Family Medicine | Admitting: Family Medicine

## 2015-01-21 DIAGNOSIS — Z1231 Encounter for screening mammogram for malignant neoplasm of breast: Secondary | ICD-10-CM | POA: Diagnosis not present

## 2015-02-04 DIAGNOSIS — R69 Illness, unspecified: Secondary | ICD-10-CM | POA: Diagnosis not present

## 2015-02-19 ENCOUNTER — Other Ambulatory Visit: Payer: Medicare HMO

## 2015-02-19 ENCOUNTER — Ambulatory Visit: Payer: Medicare HMO | Admitting: Oncology

## 2015-02-21 ENCOUNTER — Inpatient Hospital Stay: Payer: Medicare HMO | Attending: Oncology

## 2015-02-21 ENCOUNTER — Inpatient Hospital Stay (HOSPITAL_BASED_OUTPATIENT_CLINIC_OR_DEPARTMENT_OTHER): Payer: Medicare HMO | Admitting: Oncology

## 2015-02-21 VITALS — BP 142/74 | HR 86 | Temp 97.2°F | Resp 18 | Wt 174.2 lb

## 2015-02-21 DIAGNOSIS — C9110 Chronic lymphocytic leukemia of B-cell type not having achieved remission: Secondary | ICD-10-CM | POA: Insufficient documentation

## 2015-02-21 DIAGNOSIS — R5381 Other malaise: Secondary | ICD-10-CM | POA: Diagnosis not present

## 2015-02-21 DIAGNOSIS — Z79899 Other long term (current) drug therapy: Secondary | ICD-10-CM

## 2015-02-21 DIAGNOSIS — K219 Gastro-esophageal reflux disease without esophagitis: Secondary | ICD-10-CM

## 2015-02-21 DIAGNOSIS — F329 Major depressive disorder, single episode, unspecified: Secondary | ICD-10-CM | POA: Insufficient documentation

## 2015-02-21 DIAGNOSIS — R5383 Other fatigue: Secondary | ICD-10-CM

## 2015-02-21 DIAGNOSIS — M8580 Other specified disorders of bone density and structure, unspecified site: Secondary | ICD-10-CM | POA: Diagnosis not present

## 2015-02-21 DIAGNOSIS — F17210 Nicotine dependence, cigarettes, uncomplicated: Secondary | ICD-10-CM

## 2015-02-21 DIAGNOSIS — R69 Illness, unspecified: Secondary | ICD-10-CM | POA: Diagnosis not present

## 2015-02-21 LAB — CBC WITH DIFFERENTIAL/PLATELET
Basophils Absolute: 0.5 10*3/uL — ABNORMAL HIGH (ref 0–0.1)
Basophils Relative: 1 %
Eosinophils Absolute: 0.2 10*3/uL (ref 0–0.7)
Eosinophils Relative: 0 %
HCT: 46.9 % (ref 35.0–47.0)
Hemoglobin: 15.4 g/dL (ref 12.0–16.0)
Lymphocytes Relative: 91 %
Lymphs Abs: 61.1 10*3/uL — ABNORMAL HIGH (ref 1.0–3.6)
MCH: 32.7 pg (ref 26.0–34.0)
MCHC: 32.9 g/dL (ref 32.0–36.0)
MCV: 99.3 fL (ref 80.0–100.0)
Monocytes Absolute: 1.2 10*3/uL — ABNORMAL HIGH (ref 0.2–0.9)
Monocytes Relative: 2 %
Neutro Abs: 3.8 10*3/uL (ref 1.4–6.5)
Neutrophils Relative %: 6 %
Platelets: 128 10*3/uL — ABNORMAL LOW (ref 150–440)
RBC: 4.72 MIL/uL (ref 3.80–5.20)
RDW: 13 % (ref 11.5–14.5)
WBC: 66.8 10*3/uL (ref 3.6–11.0)

## 2015-02-21 NOTE — Progress Notes (Signed)
[place]  Telephone:(336) [phone] Fax:(336) [phone]  ID: Chinenye Donini OB: [birth year]  MR#: [id number]  [id number]  Patient Care Team: [name], MD as PCP - General (Family Medicine) [name], MD as PCP - Family Medicine (Family Medicine) Forest Gleason, MD (Oncology) Oneta Rack, MD (Dermatology)  CHIEF COMPLAINT:  Chief Complaint  Patient presents with  . CLL    INTERVAL HISTORY: Patient returns to clinic today after her primary care physician noted her white blood cell count had increased to greater than 60. She admits to increasing fatigue, but otherwise feels well. She denies any recent fevers or illnesses. She has a good appetite and denies weight loss. She denies any night sweats. She has no chest pain or shortness of breath. She denies any nausea, vomiting, constipation, or diarrhea. Patient offers no further specific complaints today.  REVIEW OF SYSTEMS:   Review of Systems  Constitutional: Positive for malaise/fatigue. Negative for fever, weight loss and diaphoresis.  Respiratory: Negative.   Cardiovascular: Negative.   Gastrointestinal: Negative.   Musculoskeletal: Negative.   Neurological: Negative.     As per HPI. Otherwise, a complete review of systems is negatve.  PAST MEDICAL HISTORY: Past Medical History  Diagnosis Date  . GERD (gastroesophageal reflux disease)   . Allergy   . Depression   . Osteopenia   . Lobar pneumonia ([place])   . Leukemia, lymphoid ([place])     PAST SURGICAL HISTORY: Past Surgical History  Procedure Laterality Date  . Appendectomy    . Abdominal hysterectomy    . Colon surgery      sigmoid resection  . Spine surgery      L4-5  . Breast biopsy Left     bx x 3-neg    FAMILY HISTORY Family History  Problem Relation Age of Onset  . Osteoporosis Mother   . Hypertension Father   . Heart attack Father   . Stroke Maternal Grandfather   . Breast cancer Sister 27       ADVANCED  DIRECTIVES:    HEALTH MAINTENANCE: Social History  Substance Use Topics  . Smoking status: Current Every Day Smoker -- 1.00 packs/day    Types: Cigarettes  . Smokeless tobacco: Never Used  . Alcohol Use: No     Colonoscopy:  PAP:  Bone density:  Lipid panel:  Allergies  Allergen Reactions  . Augmentin [Amoxicillin-Pot Clavulanate] Swelling    tongue  . Biaxin [Clarithromycin] Swelling and Rash    tongue    Current Outpatient Prescriptions  Medication Sig Dispense Refill  . albuterol (PROVENTIL HFA;VENTOLIN HFA) 108 (90 BASE) MCG/ACT inhaler Inhale 1-2 puffs into the lungs every 6 (six) hours as needed for wheezing or shortness of breath. Dispense with aerochamber 1 Inhaler 0  . benazepril (LOTENSIN) 40 MG tablet Take 1 tablet (40 mg total) by mouth daily. 90 tablet 4  . LORazepam (ATIVAN) 1 MG tablet Take 0.5 tablets (0.5 mg total) by mouth 2 (two) times daily as needed for anxiety. 90 tablet 1  . omeprazole (PRILOSEC) 20 MG capsule Take 1 capsule (20 mg total) by mouth daily as needed. 90 capsule 4  . PARoxetine (PAXIL) 30 MG tablet Take 1 tablet (30 mg total) by mouth daily. 90 tablet 4  . Triamcinolone Acetonide (NASACORT AQ NA) Place into the nose daily.     No current facility-administered medications for this visit.    OBJECTIVE: Filed Vitals:   02/21/15 1002  BP: 142/74  Pulse: 86  Temp: 97.2 F (36.2 C)  Resp: 18     Body mass index is 27.59 kg/(m^2).    ECOG FS:0 - Asymptomatic  General: Well-developed, well-nourished, no acute distress. Eyes: anicteric sclera. Lungs: Clear to auscultation bilaterally. Heart: Regular rate and rhythm. No rubs, murmurs, or gallops. Abdomen: Soft, nontender, nondistended. No organomegaly noted, normoactive bowel sounds. Musculoskeletal: No edema, cyanosis, or clubbing. Neuro: Alert, answering all questions appropriately. Cranial nerves grossly intact. Skin: No rashes or petechiae noted. Psych: Normal affect. Lymphatics:  No cervical, calvicular, axillary or inguinal LAD.   LAB RESULTS:  Lab Results  Component Value Date   NA 141 01/06/2015   K 4.7 01/06/2015   CL 102 01/06/2015   CO2 25 01/06/2015   GLUCOSE 101* 01/06/2015   BUN 10 01/06/2015   CREATININE 0.87 01/06/2015   CALCIUM 9.4 01/06/2015   PROT 6.1 01/06/2015   ALBUMIN 4.6 01/06/2015   AST 18 01/06/2015   ALT 16 01/06/2015   ALKPHOS 93 01/06/2015   BILITOT 0.7 01/06/2015   GFRNONAA 66 01/06/2015   GFRAA 76 01/06/2015    Lab Results  Component Value Date   WBC 66.8* 02/21/2015   NEUTROABS 3.8 02/21/2015   HGB 15.4 02/21/2015   HCT 46.9 02/21/2015   MCV 99.3 02/21/2015   PLT 128* 02/21/2015     STUDIES: No results found.  ASSESSMENT: CLL, Rai stage 0  PLAN:    1. CLL: Patient's white count is slowly trending up. In June 2015 it was 107, in June 2016 it was 39, and currently is 25. She has not quite reached a doubling time of less than 6 months, but likely will in the near future. Also her platelet count is still greater than 100, but is declining. She also has increasing fatigue which may be related. I suspect patient will require treatment with single agent Rituxan weekly 4 in the near future. Return to clinic in 6 weeks for laboratory work and then in 3 months for laboratory work and further evaluation.   Approximately 30 minutes was spent in discussion of which greater than 50% was consultation.  Patient expressed understanding and was in agreement with this plan. She also understands that She can call clinic at any time with any questions, concerns, or complaints.   Lloyd Huger, MD   02/21/2015 10:43 AM

## 2015-02-21 NOTE — Progress Notes (Signed)
Patient is here for follow up earlier than initially planned due to increasing WBC on check at PCP.

## 2015-03-20 DIAGNOSIS — D692 Other nonthrombocytopenic purpura: Secondary | ICD-10-CM | POA: Diagnosis not present

## 2015-03-20 DIAGNOSIS — X32XXXA Exposure to sunlight, initial encounter: Secondary | ICD-10-CM | POA: Diagnosis not present

## 2015-03-20 DIAGNOSIS — L578 Other skin changes due to chronic exposure to nonionizing radiation: Secondary | ICD-10-CM | POA: Diagnosis not present

## 2015-03-20 DIAGNOSIS — Z85828 Personal history of other malignant neoplasm of skin: Secondary | ICD-10-CM | POA: Diagnosis not present

## 2015-04-04 ENCOUNTER — Inpatient Hospital Stay: Payer: Medicare HMO | Attending: Oncology

## 2015-04-04 DIAGNOSIS — C9110 Chronic lymphocytic leukemia of B-cell type not having achieved remission: Secondary | ICD-10-CM | POA: Diagnosis not present

## 2015-04-04 LAB — CBC WITH DIFFERENTIAL/PLATELET
Basophils Absolute: 0.2 10*3/uL — ABNORMAL HIGH (ref 0–0.1)
Basophils Relative: 0 %
Eosinophils Absolute: 0.2 10*3/uL (ref 0–0.7)
Eosinophils Relative: 0 %
HCT: 44.7 % (ref 35.0–47.0)
Hemoglobin: 14.7 g/dL (ref 12.0–16.0)
Lymphocytes Relative: 94 %
Lymphs Abs: 82.5 10*3/uL — ABNORMAL HIGH (ref 1.0–3.6)
MCH: 32.6 pg (ref 26.0–34.0)
MCHC: 33 g/dL (ref 32.0–36.0)
MCV: 98.8 fL (ref 80.0–100.0)
Monocytes Absolute: 1.3 10*3/uL — ABNORMAL HIGH (ref 0.2–0.9)
Monocytes Relative: 1 %
Neutro Abs: 4.3 10*3/uL (ref 1.4–6.5)
Neutrophils Relative %: 5 %
Platelets: 133 10*3/uL — ABNORMAL LOW (ref 150–440)
RBC: 4.52 MIL/uL (ref 3.80–5.20)
RDW: 13 % (ref 11.5–14.5)
WBC: 88.5 10*3/uL (ref 3.6–11.0)

## 2015-04-08 ENCOUNTER — Ambulatory Visit (INDEPENDENT_AMBULATORY_CARE_PROVIDER_SITE_OTHER): Payer: Medicare HMO | Admitting: Family Medicine

## 2015-04-08 ENCOUNTER — Encounter: Payer: Self-pay | Admitting: Family Medicine

## 2015-04-08 VITALS — BP 125/76 | HR 80 | Temp 99.1°F | Ht 66.6 in | Wt 171.0 lb

## 2015-04-08 DIAGNOSIS — J019 Acute sinusitis, unspecified: Secondary | ICD-10-CM

## 2015-04-08 DIAGNOSIS — J0100 Acute maxillary sinusitis, unspecified: Secondary | ICD-10-CM | POA: Insufficient documentation

## 2015-04-08 DIAGNOSIS — C9110 Chronic lymphocytic leukemia of B-cell type not having achieved remission: Secondary | ICD-10-CM | POA: Diagnosis not present

## 2015-04-08 MED ORDER — SULFAMETHOXAZOLE-TRIMETHOPRIM 800-160 MG PO TABS: 1.0000 | ORAL_TABLET | Freq: Two times a day (BID) | ORAL | Status: DC

## 2015-04-08 NOTE — Assessment & Plan Note (Signed)
Getting worse 

## 2015-04-08 NOTE — Assessment & Plan Note (Signed)
Discussed sinusitis care and treatment use of over-the-counter medications such as Mucinex, Tylenol or Tylenol cold and sinus Nasal rinses to be okay Discuss use of antibiotics Will use Septra

## 2015-04-08 NOTE — Progress Notes (Signed)
BP 125/76 mmHg  Pulse 80  Temp(Src) 99.1 F (37.3 C)  Ht 5' 6.6" (1.692 m)  Wt 171 lb (77.565 kg)  BMI 27.09 kg/m2  SpO2 98%   Subjective:    Patient ID: Katherine [name], female    DOB: November 25, 1940, 75 y.o.   MRN: [id number]  HPI: Ishaani Ellingsworth is a 75 y.o. female  Chief Complaint  Patient presents with  . "not feeling well"    tired, sleeping a lot   patient over the last week with chills and aches feeling bad and marked fatigue and just not feeling well all over sleeping a lot and very tired. Having a lot of congestion and drainage cough cold. Patient's leukemia has gotten worse with white count up to 88,000. They're now talking chemotherapy with patient. Hasn't taken other medications and coughing frequently during visit    Relevant past medical, surgical, family and social history reviewed and updated as indicated. Interim medical history since our last visit reviewed. Allergies and medications reviewed and updated.  Review of Systems  Constitutional: Positive for fever, chills, diaphoresis and fatigue.  HENT: Positive for congestion, postnasal drip, rhinorrhea, sinus pressure, sneezing, sore throat and voice change. Negative for ear pain.   Respiratory: Positive for cough. Negative for shortness of breath.   Cardiovascular: Negative.  Negative for chest pain and leg swelling.  Gastrointestinal: Negative.     Per HPI unless specifically indicated above     Objective:    BP 125/76 mmHg  Pulse 80  Temp(Src) 99.1 F (37.3 C)  Ht 5' 6.6" (1.692 m)  Wt 171 lb (77.565 kg)  BMI 27.09 kg/m2  SpO2 98%  Wt Readings from Last 3 Encounters:  04/08/15 171 lb (77.565 kg)  02/21/15 174 lb 2.6 oz (79 kg)  01/14/15 172 lb (78.019 kg)    Physical Exam  Constitutional: She is oriented to person, place, and time. She appears well-developed and well-nourished. No distress.  HENT:  Head: Normocephalic and atraumatic.  Right Ear: Hearing and external ear normal.   Left Ear: Hearing and external ear normal.  Nose: Nose normal.  Mouth/Throat: Oropharyngeal exudate present.  Eyes: Conjunctivae and lids are normal. Right eye exhibits no discharge. Left eye exhibits no discharge. No scleral icterus.  Neck: No thyromegaly present.  Cardiovascular: Normal rate, regular rhythm and normal heart sounds.   Pulmonary/Chest: Effort normal. No respiratory distress. She has no wheezes. She has no rales.  Rhonchi   Musculoskeletal: Normal range of motion.  Neurological: She is alert and oriented to person, place, and time.  Skin: Skin is intact. No rash noted.  Psychiatric: She has a normal mood and affect. Her speech is normal and behavior is normal. Judgment and thought content normal. Cognition and memory are normal.    Results for orders placed or performed in visit on 04/04/15  CBC with Differential/Platelet  Result Value Ref Range   WBC 88.5 (HH) 3.6 - 11.0 K/uL   RBC 4.52 3.80 - 5.20 MIL/uL   Hemoglobin 14.7 12.0 - 16.0 g/dL   HCT 44.7 35.0 - 47.0 %   MCV 98.8 80.0 - 100.0 fL   MCH 32.6 26.0 - 34.0 pg   MCHC 33.0 32.0 - 36.0 g/dL   RDW 13.0 11.5 - 14.5 %   Platelets 133 (L) 150 - 440 K/uL   Neutrophils Relative % 5% %   Neutro Abs 4.3 1.4 - 6.5 K/uL   Lymphocytes Relative 94% %   Lymphs Abs 82.5 (H) 1.0 -  3.6 K/uL   Monocytes Relative 1% %   Monocytes Absolute 1.3 (H) 0.2 - 0.9 K/uL   Eosinophils Relative 0% %   Eosinophils Absolute 0.2 0 - 0.7 K/uL   Basophils Relative 0% %   Basophils Absolute 0.2 (H) 0 - 0.1 K/uL      Assessment & Plan:   Problem List Items Addressed This Visit      Respiratory   Sinusitis, acute - Primary    Discussed sinusitis care and treatment use of over-the-counter medications such as Mucinex, Tylenol or Tylenol cold and sinus Nasal rinses to be okay Discuss use of antibiotics Will use Septra      Relevant Medications   sulfamethoxazole-trimethoprim (BACTRIM DS,SEPTRA DS) 800-160 MG tablet     Other    Leukemia, lymphoid (HCC)    Getting worse      Relevant Medications   aspirin EC 81 MG tablet       Follow up plan: Return for As scheduled.

## 2015-04-16 ENCOUNTER — Telehealth: Payer: Self-pay | Admitting: Family Medicine

## 2015-04-16 MED ORDER — DOXYCYCLINE HYCLATE 100 MG PO TABS: 100.0000 mg | ORAL_TABLET | Freq: Two times a day (BID) | ORAL | Status: DC

## 2015-04-16 NOTE — Telephone Encounter (Signed)
Call pt 

## 2015-04-16 NOTE — Telephone Encounter (Signed)
Pt has developed a fever since visit and would like to know if she needs to come in.

## 2015-04-16 NOTE — Telephone Encounter (Signed)
Phone call Discussed with patient not any better on sulfa drug will discontinue start doxycycline concerned because of patient's leukemia if not better will need office visit and further evaluation.

## 2015-05-23 ENCOUNTER — Inpatient Hospital Stay: Payer: Medicare HMO | Attending: Oncology

## 2015-05-23 ENCOUNTER — Ambulatory Visit (HOSPITAL_BASED_OUTPATIENT_CLINIC_OR_DEPARTMENT_OTHER): Payer: Medicare HMO | Admitting: Oncology

## 2015-05-23 VITALS — BP 132/83 | HR 80 | Temp 97.6°F | Resp 20 | Wt 172.2 lb

## 2015-05-23 DIAGNOSIS — C9110 Chronic lymphocytic leukemia of B-cell type not having achieved remission: Secondary | ICD-10-CM

## 2015-05-23 DIAGNOSIS — Z79899 Other long term (current) drug therapy: Secondary | ICD-10-CM

## 2015-05-23 DIAGNOSIS — Z7982 Long term (current) use of aspirin: Secondary | ICD-10-CM

## 2015-05-23 DIAGNOSIS — K219 Gastro-esophageal reflux disease without esophagitis: Secondary | ICD-10-CM | POA: Insufficient documentation

## 2015-05-23 DIAGNOSIS — R5383 Other fatigue: Secondary | ICD-10-CM | POA: Diagnosis not present

## 2015-05-23 DIAGNOSIS — R5381 Other malaise: Secondary | ICD-10-CM | POA: Insufficient documentation

## 2015-05-23 DIAGNOSIS — M8580 Other specified disorders of bone density and structure, unspecified site: Secondary | ICD-10-CM | POA: Insufficient documentation

## 2015-05-23 DIAGNOSIS — F17210 Nicotine dependence, cigarettes, uncomplicated: Secondary | ICD-10-CM | POA: Diagnosis not present

## 2015-05-23 DIAGNOSIS — R69 Illness, unspecified: Secondary | ICD-10-CM | POA: Diagnosis not present

## 2015-05-23 LAB — CBC WITH DIFFERENTIAL/PLATELET
Basophils Absolute: 0.5 10*3/uL — ABNORMAL HIGH (ref 0–0.1)
Basophils Relative: 1 %
Eosinophils Absolute: 0.3 10*3/uL (ref 0–0.7)
Eosinophils Relative: 0 %
HCT: 46.4 % (ref 35.0–47.0)
Hemoglobin: 15.1 g/dL (ref 12.0–16.0)
Lymphocytes Relative: 94 %
Lymphs Abs: 93.6 10*3/uL — ABNORMAL HIGH (ref 1.0–3.6)
MCH: 32.5 pg (ref 26.0–34.0)
MCHC: 32.5 g/dL (ref 32.0–36.0)
MCV: 99.9 fL (ref 80.0–100.0)
Monocytes Absolute: 1 10*3/uL — ABNORMAL HIGH (ref 0.2–0.9)
Monocytes Relative: 1 %
Neutro Abs: 3.9 10*3/uL (ref 1.4–6.5)
Neutrophils Relative %: 4 %
Platelets: 139 10*3/uL — ABNORMAL LOW (ref 150–440)
RBC: 4.65 MIL/uL (ref 3.80–5.20)
RDW: 13.7 % (ref 11.5–14.5)
WBC: 99.4 10*3/uL (ref 3.6–11.0)

## 2015-05-23 NOTE — Progress Notes (Signed)
[employer]  Telephone:(336) [phone] [phone]) [phone]  ID: Katherine [name] OB: Jan 17, 1941  MR#: [id number]  [id number]  Patient Care Team: [name], MD as PCP - General (Family Medicine) [name], MD as PCP - Family Medicine (Family Medicine) Forest Gleason, MD (Oncology) Oneta Rack, MD (Dermatology)  CHIEF COMPLAINT:  Chief Complaint  Patient presents with  . OTHER    CLL-follow up    INTERVAL HISTORY: Patient returns to clinic today for 3 month follow up. She admits to fatigue, but otherwise feels well. She denies any recent fevers or illnesses. She has a good appetite and denies weight loss. She denies night sweats. She has no chest pain or shortness of breath. She denies any nausea, vomiting, constipation, or diarrhea. Patient offers no further specific complaints today.  REVIEW OF SYSTEMS:   Review of Systems  Constitutional: Positive for malaise/fatigue. Negative for fever, weight loss and diaphoresis.  Respiratory: Negative.   Cardiovascular: Negative.   Gastrointestinal: Negative.   Musculoskeletal: Negative.   Neurological: Negative.     As per HPI. Otherwise, a complete review of systems is negatve.  PAST MEDICAL HISTORY: Past Medical History  Diagnosis Date  . GERD (gastroesophageal reflux disease)   . Allergy   . Depression   . Osteopenia   . Lobar pneumonia ([place])   . Leukemia, lymphoid ([place])     PAST SURGICAL HISTORY: Past Surgical History  Procedure Laterality Date  . Appendectomy    . Abdominal hysterectomy    . Colon surgery      sigmoid resection  . Spine surgery      L4-5  . Breast biopsy Left     bx x 3-neg    FAMILY HISTORY Family History  Problem Relation Age of Onset  . Osteoporosis Mother   . Hypertension Father   . Heart attack Father   . Stroke Maternal Grandfather   . Breast cancer Sister 48       ADVANCED DIRECTIVES:    HEALTH MAINTENANCE: Social History  Substance Use  Topics  . Smoking status: Current Every Day Smoker -- 1.00 packs/day    Types: Cigarettes  . Smokeless tobacco: Never Used  . Alcohol Use: No     Allergies  Allergen Reactions  . Augmentin [Amoxicillin-Pot Clavulanate] Swelling    tongue  . Biaxin [Clarithromycin] Swelling and Rash    tongue    Current Outpatient Prescriptions  Medication Sig Dispense Refill  . albuterol (PROVENTIL HFA;VENTOLIN HFA) 108 (90 BASE) MCG/ACT inhaler Inhale 1-2 puffs into the lungs every 6 (six) hours as needed for wheezing or shortness of breath. Dispense with aerochamber 1 Inhaler 0  . aspirin EC 81 MG tablet Take 81 mg by mouth daily.    . benazepril (LOTENSIN) 40 MG tablet Take 1 tablet (40 mg total) by mouth daily. 90 tablet 4  . doxycycline (VIBRA-TABS) 100 MG tablet Take 1 tablet (100 mg total) by mouth 2 (two) times daily. 20 tablet 0  . LORazepam (ATIVAN) 1 MG tablet Take 0.5 tablets (0.5 mg total) by mouth 2 (two) times daily as needed for anxiety. 90 tablet 1  . omeprazole (PRILOSEC) 20 MG capsule Take 1 capsule (20 mg total) by mouth daily as needed. 90 capsule 4  . PARoxetine (PAXIL) 30 MG tablet Take 1 tablet (30 mg total) by mouth daily. 90 tablet 4  . Triamcinolone Acetonide (NASACORT AQ NA) Place into the nose daily.     No current facility-administered medications for this  visit.    OBJECTIVE: Filed Vitals:   05/23/15 1047  BP: 132/83  Pulse: 80  Temp: 97.6 F (36.4 C)  Resp: 20     Body mass index is 27.28 kg/(m^2).    ECOG FS:0 - Asymptomatic  General: Well-developed, well-nourished, no acute distress. Eyes: anicteric sclera. Lungs: Clear to auscultation bilaterally. Heart: Regular rate and rhythm. No rubs, murmurs, or gallops. Musculoskeletal: No edema, cyanosis, or clubbing. Neuro: Alert, answering all questions appropriately. Cranial nerves grossly intact. Skin: No rashes or petechiae noted. Psych: Normal affect.   LAB RESULTS:  Lab Results  Component Value Date     NA 141 01/06/2015   K 4.7 01/06/2015   CL 102 01/06/2015   CO2 25 01/06/2015   GLUCOSE 101* 01/06/2015   BUN 10 01/06/2015   CREATININE 0.87 01/06/2015   CALCIUM 9.4 01/06/2015   PROT 6.1 01/06/2015   ALBUMIN 4.6 01/06/2015   AST 18 01/06/2015   ALT 16 01/06/2015   ALKPHOS 93 01/06/2015   BILITOT 0.7 01/06/2015   GFRNONAA 66 01/06/2015   GFRAA 76 01/06/2015    Lab Results  Component Value Date   WBC 99.4* 05/23/2015   NEUTROABS 3.9 05/23/2015   HGB 15.1 05/23/2015   HCT 46.4 05/23/2015   MCV 99.9 05/23/2015   PLT 139* 05/23/2015     STUDIES: No results found.  ASSESSMENT: CLL, Rai stage 0  PLAN:    1. CLL: Patient's white count is slowly trending up. In June 2015 it was 72, in June 2016 it was 59, and currently is 99.4. She has not quite reached a doubling time of less than 6 months, but likely will in the near future. Also her platelet count is still greater than 100, but is declining. I suspect patient will require treatment with single agent Rituxan weekly 4 in the near future. Return to clinic in 6 weeks for laboratory work and then in 3 months for laboratory work and further evaluation.   Approximately 30 minutes was spent in discussion of which greater than 50% was consultation.  Patient expressed understanding and was in agreement with this plan. She also understands that She can call clinic at any time with any questions, concerns, or complaints.   Mayra Reel, NP   05/23/2015 11:23 AM  Patient was seen and evaluated independently and I agree with the assessment and plan as dictated above.  Lloyd Huger, MD 05/23/2015 4:46 PM

## 2015-05-23 NOTE — Progress Notes (Signed)
Patient here today for follow up regarding CLL, denies any concerns today.

## 2015-05-27 ENCOUNTER — Telehealth: Payer: Self-pay

## 2015-05-27 ENCOUNTER — Other Ambulatory Visit: Payer: Self-pay | Admitting: Family Medicine

## 2015-05-27 NOTE — Telephone Encounter (Signed)
At 05/23/15 appt it was discussed that patient will need to initiate Rituxan in the near future.  She was advised to return for labs in 6 weeks then MD in 3 months but she would prefer to not wait 3 months if treatment is needed and would like your opinion (is a [place] patient).

## 2015-05-27 NOTE — Telephone Encounter (Signed)
She can come back end of April or early May to treat.  Please let me know the date and I'll put in her orders.

## 2015-05-27 NOTE — Telephone Encounter (Signed)
Katherine [name], can we please schedule patient for lab/FINN/Rituxan on 4/28 or 5/5? Please inform patient of new appointment. Thank you.

## 2015-05-28 ENCOUNTER — Other Ambulatory Visit: Payer: Self-pay | Admitting: Oncology

## 2015-05-28 DIAGNOSIS — C9110 Chronic lymphocytic leukemia of B-cell type not having achieved remission: Secondary | ICD-10-CM | POA: Insufficient documentation

## 2015-05-28 NOTE — Telephone Encounter (Signed)
done

## 2015-05-28 NOTE — Telephone Encounter (Signed)
Appt for lab/MD/Rituxan on 06/27/15.  Please enter orders.  Thanks.

## 2015-06-27 ENCOUNTER — Inpatient Hospital Stay (HOSPITAL_BASED_OUTPATIENT_CLINIC_OR_DEPARTMENT_OTHER): Payer: Medicare HMO | Admitting: Oncology

## 2015-06-27 ENCOUNTER — Inpatient Hospital Stay: Payer: Medicare HMO

## 2015-06-27 ENCOUNTER — Inpatient Hospital Stay: Payer: Medicare HMO | Attending: Oncology

## 2015-06-27 VITALS — BP 147/85 | HR 86 | Temp 97.3°F | Resp 16 | Wt 171.1 lb

## 2015-06-27 VITALS — BP 122/70 | HR 88 | Temp 97.5°F | Resp 18

## 2015-06-27 DIAGNOSIS — Z7982 Long term (current) use of aspirin: Secondary | ICD-10-CM | POA: Diagnosis not present

## 2015-06-27 DIAGNOSIS — R531 Weakness: Secondary | ICD-10-CM | POA: Diagnosis not present

## 2015-06-27 DIAGNOSIS — K219 Gastro-esophageal reflux disease without esophagitis: Secondary | ICD-10-CM

## 2015-06-27 DIAGNOSIS — C9110 Chronic lymphocytic leukemia of B-cell type not having achieved remission: Secondary | ICD-10-CM

## 2015-06-27 DIAGNOSIS — F419 Anxiety disorder, unspecified: Secondary | ICD-10-CM | POA: Diagnosis not present

## 2015-06-27 DIAGNOSIS — F17210 Nicotine dependence, cigarettes, uncomplicated: Secondary | ICD-10-CM

## 2015-06-27 DIAGNOSIS — Z803 Family history of malignant neoplasm of breast: Secondary | ICD-10-CM | POA: Insufficient documentation

## 2015-06-27 DIAGNOSIS — Z79899 Other long term (current) drug therapy: Secondary | ICD-10-CM | POA: Insufficient documentation

## 2015-06-27 DIAGNOSIS — M542 Cervicalgia: Secondary | ICD-10-CM | POA: Diagnosis not present

## 2015-06-27 DIAGNOSIS — R5383 Other fatigue: Secondary | ICD-10-CM

## 2015-06-27 DIAGNOSIS — R5381 Other malaise: Secondary | ICD-10-CM | POA: Diagnosis not present

## 2015-06-27 DIAGNOSIS — R110 Nausea: Secondary | ICD-10-CM | POA: Diagnosis not present

## 2015-06-27 DIAGNOSIS — Z5112 Encounter for antineoplastic immunotherapy: Secondary | ICD-10-CM | POA: Insufficient documentation

## 2015-06-27 DIAGNOSIS — D696 Thrombocytopenia, unspecified: Secondary | ICD-10-CM

## 2015-06-27 DIAGNOSIS — M8580 Other specified disorders of bone density and structure, unspecified site: Secondary | ICD-10-CM | POA: Insufficient documentation

## 2015-06-27 DIAGNOSIS — R69 Illness, unspecified: Secondary | ICD-10-CM | POA: Diagnosis not present

## 2015-06-27 LAB — CBC WITH DIFFERENTIAL/PLATELET
Basophils Absolute: 0.3 10*3/uL — ABNORMAL HIGH (ref 0–0.1)
Basophils Relative: 0 %
Eosinophils Absolute: 0.3 10*3/uL (ref 0–0.7)
Eosinophils Relative: 0 %
HCT: 45.1 % (ref 35.0–47.0)
Hemoglobin: 14.8 g/dL (ref 12.0–16.0)
Lymphocytes Relative: 94 %
Lymphs Abs: 96.8 10*3/uL — ABNORMAL HIGH (ref 1.0–3.6)
MCH: 32.4 pg (ref 26.0–34.0)
MCHC: 32.8 g/dL (ref 32.0–36.0)
MCV: 99 fL (ref 80.0–100.0)
Monocytes Absolute: 1.5 10*3/uL — ABNORMAL HIGH (ref 0.2–0.9)
Monocytes Relative: 1 %
Neutro Abs: 4.8 10*3/uL (ref 1.4–6.5)
Neutrophils Relative %: 5 %
Platelets: 127 10*3/uL — ABNORMAL LOW (ref 150–440)
RBC: 4.56 MIL/uL (ref 3.80–5.20)
RDW: 13.3 % (ref 11.5–14.5)
WBC: 103.7 10*3/uL (ref 3.6–11.0)

## 2015-06-27 MED ORDER — SODIUM CHLORIDE 0.9 % IV SOLN
375.0000 mg/m2 | Freq: Once | INTRAVENOUS | Status: AC
  Administered 2015-06-27: 700 mg via INTRAVENOUS
  Filled 2015-06-27: qty 70

## 2015-06-27 MED ORDER — SODIUM CHLORIDE 0.9 % IV SOLN
Freq: Once | INTRAVENOUS | Status: AC
  Administered 2015-06-27: 10:00:00 via INTRAVENOUS
  Filled 2015-06-27: qty 1000

## 2015-06-27 NOTE — Progress Notes (Signed)
[place]  Telephone:(336) [phone] Fax:(336) [phone]  ID: Katherine [name] OB: [birth year]  MR#: [id number]  [id number]  Patient Care Team: [name], MD as PCP - General (Family Medicine) [name], MD as PCP - Family Medicine (Family Medicine) Forest Gleason, MD (Oncology) Oneta Rack, MD (Dermatology)  CHIEF COMPLAINT:  Chief Complaint  Patient presents with  . CLL    INTERVAL HISTORY: Patient returns to clinic today As to initiate cycle 1 of 4 of weekly Rituxan for her progressive CLL. She initially did not wish to start treatments, but then called clinic having changed her mind. She has a doubling time of her white blood cell count of less than 6 months and has become slightly thrombocytopenic.  She admits to fatigue, but otherwise feels well. She denies any recent fevers or illnesses. She has a good appetite and denies weight loss. She denies night sweats. She has no chest pain or shortness of breath. She denies any nausea, vomiting, constipation, or diarrhea. Patient offers no further specific complaints today.  REVIEW OF SYSTEMS:   Review of Systems  Constitutional: Positive for malaise/fatigue. Negative for fever, weight loss and diaphoresis.  Respiratory: Negative.  Negative for shortness of breath.   Cardiovascular: Negative.  Negative for chest pain.  Gastrointestinal: Negative.   Musculoskeletal: Negative.   Neurological: Positive for weakness.  Endo/Heme/Allergies: Does not bruise/bleed easily.  Psychiatric/Behavioral: The patient is nervous/anxious.     As per HPI. Otherwise, a complete review of systems is negatve.  PAST MEDICAL HISTORY: Past Medical History  Diagnosis Date  . GERD (gastroesophageal reflux disease)   . Allergy   . Depression   . Osteopenia   . Lobar pneumonia ([place])   . Leukemia, lymphoid ([place])     PAST SURGICAL HISTORY: Past Surgical History  Procedure Laterality Date  . Appendectomy    .  Abdominal hysterectomy    . Colon surgery      sigmoid resection  . Spine surgery      L4-5  . Breast biopsy Left     bx x 3-neg    FAMILY HISTORY Family History  Problem Relation Age of Onset  . Osteoporosis Mother   . Hypertension Father   . Heart attack Father   . Stroke Maternal Grandfather   . Breast cancer Sister 9       ADVANCED DIRECTIVES:    HEALTH MAINTENANCE: Social History  Substance Use Topics  . Smoking status: Current Every Day Smoker -- 1.00 packs/day    Types: Cigarettes  . Smokeless tobacco: Never Used  . Alcohol Use: No     Allergies  Allergen Reactions  . Augmentin [Amoxicillin-Pot Clavulanate] Swelling    tongue  . Biaxin [Clarithromycin] Swelling and Rash    tongue    Current Outpatient Prescriptions  Medication Sig Dispense Refill  . albuterol (PROVENTIL HFA;VENTOLIN HFA) 108 (90 BASE) MCG/ACT inhaler Inhale 1-2 puffs into the lungs every 6 (six) hours as needed for wheezing or shortness of breath. Dispense with aerochamber 1 Inhaler 0  . aspirin EC 81 MG tablet Take 81 mg by mouth daily.    . benazepril (LOTENSIN) 40 MG tablet Take 1 tablet (40 mg total) by mouth daily. 90 tablet 4  . LORazepam (ATIVAN) 1 MG tablet Take 0.5 tablets (0.5 mg total) by mouth 2 (two) times daily as needed for anxiety. 90 tablet 1  . omeprazole (PRILOSEC) 20 MG capsule Take 1 capsule (20 mg total) by mouth daily as needed.  90 capsule 4  . PARoxetine (PAXIL) 30 MG tablet TAKE 1 TABLET BY MOUTH EVERY DAY 30 tablet 6  . Triamcinolone Acetonide (NASACORT AQ NA) Place into the nose daily.     No current facility-administered medications for this visit.    OBJECTIVE: Filed Vitals:   06/27/15 0837  BP: 147/85  Pulse: 86  Temp: 97.3 F (36.3 C)  Resp: 16     Body mass index is 27.11 kg/(m^2).    ECOG FS:0 - Asymptomatic  General: Well-developed, well-nourished, no acute distress. Eyes: anicteric sclera. Lungs: Clear to auscultation bilaterally. Heart:  Regular rate and rhythm. No rubs, murmurs, or gallops. Musculoskeletal: No edema, cyanosis, or clubbing. Neuro: Alert, answering all questions appropriately. Cranial nerves grossly intact. Skin: No rashes or petechiae noted. Psych: Normal affect.   LAB RESULTS:  Lab Results  Component Value Date   NA 141 01/06/2015   K 4.7 01/06/2015   CL 102 01/06/2015   CO2 25 01/06/2015   GLUCOSE 101* 01/06/2015   BUN 10 01/06/2015   CREATININE 0.87 01/06/2015   CALCIUM 9.4 01/06/2015   PROT 6.1 01/06/2015   ALBUMIN 4.6 01/06/2015   AST 18 01/06/2015   ALT 16 01/06/2015   ALKPHOS 93 01/06/2015   BILITOT 0.7 01/06/2015   GFRNONAA 66 01/06/2015   GFRAA 76 01/06/2015    Lab Results  Component Value Date   WBC 103.7* 06/27/2015   NEUTROABS 4.8 06/27/2015   HGB 14.8 06/27/2015   HCT 45.1 06/27/2015   MCV 99.0 06/27/2015   PLT 127* 06/27/2015     STUDIES: No results found.  ASSESSMENT: CLL, Rai stage 0  PLAN:    1. CLL: Patient's white count is slowly trending up. In June 2015 it was 58, in June 2016 it was 57, and currently is 103.7. Also her platelet count is still greater than 100, but is declining. Proceed with cycle 1 of 4 of weekly Rituxan. Return to clinic in 1 week for consideration of cycle 2. 2. Thrombocytopenia: Likely secondary to progressive CLL, Rituxan as above. 3. Weakness/fatigue: Multifactorial, monitor.   Patient expressed understanding and was in agreement with this plan. She also understands that She can call clinic at any time with any questions, concerns, or complaints.   Lloyd Huger, MD   06/27/2015 1:20 PM

## 2015-06-27 NOTE — Progress Notes (Signed)
Patient verbalized taking her Tylenol and Benadryl this morning prior to coming in to the [facility].

## 2015-06-27 NOTE — Progress Notes (Signed)
Patient does not offer any problems today.  

## 2015-07-04 ENCOUNTER — Inpatient Hospital Stay (HOSPITAL_BASED_OUTPATIENT_CLINIC_OR_DEPARTMENT_OTHER): Payer: Medicare HMO | Admitting: Family Medicine

## 2015-07-04 ENCOUNTER — Inpatient Hospital Stay: Payer: Medicare HMO

## 2015-07-04 ENCOUNTER — Encounter: Payer: Self-pay | Admitting: Family Medicine

## 2015-07-04 VITALS — BP 152/79 | HR 72 | Temp 96.8°F | Resp 18

## 2015-07-04 DIAGNOSIS — D696 Thrombocytopenia, unspecified: Secondary | ICD-10-CM

## 2015-07-04 DIAGNOSIS — C9110 Chronic lymphocytic leukemia of B-cell type not having achieved remission: Secondary | ICD-10-CM | POA: Diagnosis not present

## 2015-07-04 DIAGNOSIS — R5381 Other malaise: Secondary | ICD-10-CM | POA: Diagnosis not present

## 2015-07-04 DIAGNOSIS — R5383 Other fatigue: Secondary | ICD-10-CM | POA: Diagnosis not present

## 2015-07-04 DIAGNOSIS — Z5112 Encounter for antineoplastic immunotherapy: Secondary | ICD-10-CM | POA: Diagnosis not present

## 2015-07-04 DIAGNOSIS — F17210 Nicotine dependence, cigarettes, uncomplicated: Secondary | ICD-10-CM

## 2015-07-04 DIAGNOSIS — Z7982 Long term (current) use of aspirin: Secondary | ICD-10-CM

## 2015-07-04 DIAGNOSIS — Z803 Family history of malignant neoplasm of breast: Secondary | ICD-10-CM

## 2015-07-04 DIAGNOSIS — K219 Gastro-esophageal reflux disease without esophagitis: Secondary | ICD-10-CM

## 2015-07-04 DIAGNOSIS — Z79899 Other long term (current) drug therapy: Secondary | ICD-10-CM

## 2015-07-04 DIAGNOSIS — R531 Weakness: Secondary | ICD-10-CM

## 2015-07-04 DIAGNOSIS — F419 Anxiety disorder, unspecified: Secondary | ICD-10-CM

## 2015-07-04 LAB — CBC WITH DIFFERENTIAL/PLATELET
Basophils Absolute: 0.3 10*3/uL — ABNORMAL HIGH (ref 0–0.1)
Basophils Relative: 0 %
Eosinophils Absolute: 0.5 10*3/uL (ref 0–0.7)
Eosinophils Relative: 0 %
HCT: 45 % (ref 35.0–47.0)
Hemoglobin: 14.5 g/dL (ref 12.0–16.0)
Lymphocytes Relative: 94 %
Lymphs Abs: 107 10*3/uL — ABNORMAL HIGH (ref 1.0–3.6)
MCH: 32 pg (ref 26.0–34.0)
MCHC: 32.2 g/dL (ref 32.0–36.0)
MCV: 99.6 fL (ref 80.0–100.0)
Monocytes Absolute: 1.9 10*3/uL — ABNORMAL HIGH (ref 0.2–0.9)
Monocytes Relative: 2 %
Neutro Abs: 4.8 10*3/uL (ref 1.4–6.5)
Neutrophils Relative %: 4 %
Platelets: 147 10*3/uL — ABNORMAL LOW (ref 150–440)
RBC: 4.52 MIL/uL (ref 3.80–5.20)
RDW: 13.6 % (ref 11.5–14.5)
WBC: 114.6 10*3/uL (ref 3.6–11.0)

## 2015-07-04 MED ORDER — ACETAMINOPHEN 325 MG PO TABS
650.0000 mg | ORAL_TABLET | Freq: Once | ORAL | Status: AC
  Administered 2015-07-04: 650 mg via ORAL
  Filled 2015-07-04: qty 2

## 2015-07-04 MED ORDER — SODIUM CHLORIDE 0.9 % IV SOLN: 375.0000 mg/m2 | Freq: Once | INTRAVENOUS | Status: DC

## 2015-07-04 MED ORDER — SODIUM CHLORIDE 0.9 % IV SOLN
Freq: Once | INTRAVENOUS | Status: AC
  Administered 2015-07-04: 11:00:00 via INTRAVENOUS
  Filled 2015-07-04: qty 1000

## 2015-07-04 MED ORDER — DIPHENHYDRAMINE HCL 25 MG PO CAPS
25.0000 mg | ORAL_CAPSULE | Freq: Once | ORAL | Status: DC
  Filled 2015-07-04: qty 1

## 2015-07-04 MED ORDER — SODIUM CHLORIDE 0.9 % IV SOLN
375.0000 mg/m2 | Freq: Once | INTRAVENOUS | Status: AC
  Administered 2015-07-04: 700 mg via INTRAVENOUS
  Filled 2015-07-04: qty 70

## 2015-07-04 NOTE — Progress Notes (Signed)
Patient ambulates without assistance, brought to exam room 4.  Patient denies pain or comfort, vitals documented, medication record updated, information provided by patient.

## 2015-07-04 NOTE — Progress Notes (Signed)
[place]  [phone] [phone]  ID: Katherine [name] OB: 06-01-1940  MR#: [id number]  [id number]  Patient Care Team: [name], MD as PCP - General (Family Medicine) [name], MD as PCP - Family Medicine (Family Medicine) Forest Gleason, MD (Oncology) Oneta Rack, MD (Dermatology)  CHIEF COMPLAINT:  Chief Complaint  Patient presents with  . Follow-up    INTERVAL HISTORY: Patient returns to clinic today to continue with cycle 2 of 4 of weekly Rituxan for her progressive CLL. She initially did not wish to start treatments, but then called clinic having changed her mind. She has a doubling time of her white blood cell count of less than 6 months and has become slightly thrombocytopenic.  She reports following her first treatment she had some neck stiffness that resolved with the use of Aleve, some fatigue on Sunday each resolved on its own in some very mild nausea that she relates more to anxiety. Otherwise patient has felt very well. She denies any recent fevers or illnesses. She has a good appetite and denies weight loss. She denies night sweats. She has no chest pain or shortness of breath. She denies any nausea, vomiting, constipation, or diarrhea. Patient offers no further specific complaints today.  REVIEW OF SYSTEMS:   Review of Systems  Constitutional: Positive for malaise/fatigue. Negative for fever, chills, weight loss and diaphoresis.  Eyes: Negative.   Respiratory: Negative.  Negative for cough, hemoptysis, sputum production, shortness of breath and wheezing.   Cardiovascular: Negative.  Negative for chest pain, palpitations, orthopnea, claudication, leg swelling and PND.  Gastrointestinal: Negative.  Negative for heartburn, nausea, vomiting, abdominal pain, diarrhea, constipation, blood in stool and melena.  Genitourinary: Negative.   Musculoskeletal: Positive for neck pain.  Skin: Negative.   Neurological:  Positive for weakness. Negative for dizziness, tingling, focal weakness and seizures.  Endo/Heme/Allergies: Does not bruise/bleed easily.  Psychiatric/Behavioral: Negative for depression. The patient is nervous/anxious. The patient does not have insomnia.     As per HPI. Otherwise, a complete review of systems is negatve.  PAST MEDICAL HISTORY: Past Medical History  Diagnosis Date  . GERD (gastroesophageal reflux disease)   . Allergy   . Depression   . Osteopenia   . Lobar pneumonia ([place])   . Leukemia, lymphoid ([place])     PAST SURGICAL HISTORY: Past Surgical History  Procedure Laterality Date  . Appendectomy    . Abdominal hysterectomy    . Colon surgery      sigmoid resection  . Spine surgery      L4-5  . Breast biopsy Left     bx x 3-neg    FAMILY HISTORY Family History  Problem Relation Age of Onset  . Osteoporosis Mother   . Hypertension Father   . Heart attack Father   . Stroke Maternal Grandfather   . Breast cancer Sister 77       ADVANCED DIRECTIVES:    HEALTH MAINTENANCE: Social History  Substance Use Topics  . Smoking status: Current Every Day Smoker -- 1.00 packs/day    Types: Cigarettes  . Smokeless tobacco: Never Used  . Alcohol Use: No     Allergies  Allergen Reactions  . Augmentin [Amoxicillin-Pot Clavulanate] Swelling    tongue  . Biaxin [Clarithromycin] Swelling and Rash    tongue    Current Outpatient Prescriptions  Medication Sig Dispense Refill  . albuterol (PROVENTIL HFA;VENTOLIN HFA) 108 (90 BASE) MCG/ACT inhaler Inhale 1-2 puffs into the lungs every  6 (six) hours as needed for wheezing or shortness of breath. Dispense with aerochamber 1 Inhaler 0  . aspirin EC 81 MG tablet Take 81 mg by mouth daily.    . benazepril (LOTENSIN) 40 MG tablet Take 1 tablet (40 mg total) by mouth daily. 90 tablet 4  . LORazepam (ATIVAN) 1 MG tablet Take 0.5 tablets (0.5 mg total) by mouth 2 (two) times daily as needed for anxiety. 90 tablet 1  .  omeprazole (PRILOSEC) 20 MG capsule Take 1 capsule (20 mg total) by mouth daily as needed. 90 capsule 4  . PARoxetine (PAXIL) 30 MG tablet TAKE 1 TABLET BY MOUTH EVERY DAY 30 tablet 6  . Triamcinolone Acetonide (NASACORT AQ NA) Place into the nose daily.     No current facility-administered medications for this visit.    OBJECTIVE: Filed Vitals:   07/04/15 1007  BP: 134/85  Pulse: 76  Temp: 97 F (36.1 C)     Body mass index is 27.18 kg/(m^2).    ECOG FS:0 - Asymptomatic  General: Well-developed, well-nourished, no acute distress. Eyes: anicteric sclera. Lungs: Bilateral inspiratory wheeze, diminished throughout. Heart: Regular rate and rhythm. No rubs, murmurs, or gallops. Musculoskeletal: No edema, cyanosis, or clubbing. Neuro: Alert, answering all questions appropriately. Cranial nerves grossly intact. Skin: No rashes or petechiae noted. Psych: Normal affect.   LAB RESULTS:  Lab Results  Component Value Date   NA 141 01/06/2015   K 4.7 01/06/2015   CL 102 01/06/2015   CO2 25 01/06/2015   GLUCOSE 101* 01/06/2015   BUN 10 01/06/2015   CREATININE 0.87 01/06/2015   CALCIUM 9.4 01/06/2015   PROT 6.1 01/06/2015   ALBUMIN 4.6 01/06/2015   AST 18 01/06/2015   ALT 16 01/06/2015   ALKPHOS 93 01/06/2015   BILITOT 0.7 01/06/2015   GFRNONAA 66 01/06/2015   GFRAA 76 01/06/2015    Lab Results  Component Value Date   WBC 114.6* 07/04/2015   NEUTROABS 4.8 07/04/2015   HGB 14.5 07/04/2015   HCT 45.0 07/04/2015   MCV 99.6 07/04/2015   PLT 147* 07/04/2015     STUDIES: No results found.  ASSESSMENT: CLL, Rai stage 0  PLAN:    1. CLL: Patient tolerated cycle 1 of 4 fairly well. Her white blood cell count is 114.6, which is slightly increased from previous week. We'll proceed with cycle 2 of 4 weekly Rituxan treatments. She only experienced some mild side effect such as fatigue and some neck discomfort and very very mild nausea. 2. Thrombocytopenia: Likely secondary to  progressive CLL, Rituxan as above. Platelet count overall improved 147K. 3. Weakness/fatigue: Multifactorial, monitor.  4. Tobacco abuse. Discussed with patient that she is a candidate for low-dose CT screening for lung cancer. She has at least a 35-pack-year history of smoking and is overall asymptomatic. Patient has follow-up appointment with Dr. Luvenia Heller for six-month evaluation next week, she will mention this to acquire a referral.  Patient to return in 1 week for cycle 3 of 4 Rituxan therapies.  Dr. Rogue Bussing was available for consultation and review of plan of care for this patient.  Patient expressed understanding and was in agreement with this plan. She also understands that She can call clinic at any time with any questions, concerns, or complaints.   Evlyn Kanner, NP   07/04/2015 10:34 AM

## 2015-07-07 LAB — HEPATITIS B SURFACE ANTIGEN: Hepatitis B Surface Ag: NEGATIVE

## 2015-07-07 LAB — HEPATITIS B SURFACE ANTIBODY, QUANTITATIVE: Hepatitis B-Post: <3.1 m[IU]/mL — ABNORMAL LOW (ref >9.9)

## 2015-07-08 ENCOUNTER — Ambulatory Visit (INDEPENDENT_AMBULATORY_CARE_PROVIDER_SITE_OTHER): Payer: Medicare HMO | Admitting: Family Medicine

## 2015-07-08 ENCOUNTER — Encounter: Payer: Self-pay | Admitting: Family Medicine

## 2015-07-08 VITALS — BP 116/75 | HR 81 | Temp 98.2°F | Ht 67.0 in | Wt 170.0 lb

## 2015-07-08 DIAGNOSIS — R69 Illness, unspecified: Secondary | ICD-10-CM | POA: Diagnosis not present

## 2015-07-08 DIAGNOSIS — F32A Depression, unspecified: Secondary | ICD-10-CM

## 2015-07-08 DIAGNOSIS — I10 Essential (primary) hypertension: Secondary | ICD-10-CM | POA: Diagnosis not present

## 2015-07-08 DIAGNOSIS — Z1211 Encounter for screening for malignant neoplasm of colon: Secondary | ICD-10-CM

## 2015-07-08 DIAGNOSIS — F329 Major depressive disorder, single episode, unspecified: Secondary | ICD-10-CM

## 2015-07-08 MED ORDER — LORAZEPAM 1 MG PO TABS: 0.5000 mg | ORAL_TABLET | Freq: Two times a day (BID) | ORAL | Status: DC | PRN

## 2015-07-08 NOTE — Assessment & Plan Note (Signed)
The current medical regimen is effective;  continue present plan and medications.  

## 2015-07-08 NOTE — Progress Notes (Signed)
BP 116/75 mmHg  Pulse 81  Temp(Src) 98.2 F (36.8 C)  Ht 5\' 7"  (1.702 m)  Wt 170 lb (77.111 kg)  BMI 26.62 kg/m2  SpO2 95%   Subjective:    Patient ID: Katherine [name], female    DOB: [birth year], 75 y.o.   MRN: [id number]  HPI: Katherine [name] is a 75 y.o. female  Chief Complaint  Patient presents with  . Hypertension  Patient doing okay having marked fatigue with new medicine for leukemia to bring down her white count which is been elevated otherwise doing okay With fatigue has been taking it easy doing some exercise and walking but all in all very little Depression stable nerves stable Blood pressure doing stable.  Relevant past medical, surgical, family and social history reviewed and updated as indicated. Interim medical history since our last visit reviewed. Allergies and medications reviewed and updated.  Review of Systems  Constitutional: Positive for fatigue. Negative for fever.  Respiratory: Negative for cough, choking and shortness of breath.   Cardiovascular: Negative.     Per HPI unless specifically indicated above     Objective:    BP 116/75 mmHg  Pulse 81  Temp(Src) 98.2 F (36.8 C)  Ht 5\' 7"  (1.702 m)  Wt 170 lb (77.111 kg)  BMI 26.62 kg/m2  SpO2 95%  Wt Readings from Last 3 Encounters:  07/08/15 170 lb (77.111 kg)  07/04/15 171 lb 8.6 oz (77.81 kg)  06/27/15 171 lb 1.2 oz (77.6 kg)    Physical Exam  Constitutional: She is oriented to person, place, and time. She appears well-developed and well-nourished. No distress.  HENT:  Head: Normocephalic and atraumatic.  Right Ear: Hearing normal.  Left Ear: Hearing normal.  Nose: Nose normal.  Eyes: Conjunctivae and lids are normal. Right eye exhibits no discharge. Left eye exhibits no discharge. No scleral icterus.  Cardiovascular: Normal rate, regular rhythm and normal heart sounds.   Pulmonary/Chest: Effort normal and breath sounds normal. No respiratory distress.  Musculoskeletal:  Normal range of motion.  Neurological: She is alert and oriented to person, place, and time.  Skin: Skin is intact. No rash noted.  Psychiatric: She has a normal mood and affect. Her speech is normal and behavior is normal. Judgment and thought content normal. Cognition and memory are normal.    Results for orders placed or performed in visit on 07/04/15  CBC with Differential  Result Value Ref Range   WBC 114.6 (HH) 3.6 - 11.0 K/uL   RBC 4.52 3.80 - 5.20 MIL/uL   Hemoglobin 14.5 12.0 - 16.0 g/dL   HCT 45.0 35.0 - 47.0 %   MCV 99.6 80.0 - 100.0 fL   MCH 32.0 26.0 - 34.0 pg   MCHC 32.2 32.0 - 36.0 g/dL   RDW 13.6 11.5 - 14.5 %   Platelets 147 (L) 150 - 440 K/uL   Neutrophils Relative % 4% %   Neutro Abs 4.8 1.4 - 6.5 K/uL   Lymphocytes Relative 94% %   Lymphs Abs 107.0 (H) 1.0 - 3.6 K/uL   Monocytes Relative 2% %   Monocytes Absolute 1.9 (H) 0.2 - 0.9 K/uL   Eosinophils Relative 0% %   Eosinophils Absolute 0.5 0 - 0.7 K/uL   Basophils Relative 0% %   Basophils Absolute 0.3 (H) 0 - 0.1 K/uL      Assessment & Plan:   Problem List Items Addressed This Visit      Cardiovascular and Mediastinum   Essential hypertension -  Primary    The current medical regimen is effective;  continue present plan and medications.       Relevant Orders   Basic metabolic panel     Other   Depression    The current medical regimen is effective;  continue present plan and medications.       Relevant Medications   LORazepam (ATIVAN) 1 MG tablet    Other Visit Diagnoses    Colon cancer screening        Relevant Orders    Ambulatory referral to General Surgery        Follow up plan: Return in about 6 months (around 01/08/2016) for Physical Exam.

## 2015-07-09 ENCOUNTER — Encounter: Payer: Self-pay | Admitting: Family Medicine

## 2015-07-09 LAB — BASIC METABOLIC PANEL
BUN/Creatinine Ratio: 12 (ref 12–28)
BUN: 10 mg/dL (ref 8–27)
CO2: 25 mmol/L (ref 18–29)
Calcium: 9.7 mg/dL (ref 8.7–10.3)
Chloride: 103 mmol/L (ref 96–106)
Creatinine, Ser: 0.81 mg/dL (ref 0.57–1.00)
GFR calc Af Amer: 83 mL/min/{1.73_m2} (ref >59)
GFR calc non Af Amer: 72 mL/min/{1.73_m2} (ref >59)
Glucose: 106 mg/dL — ABNORMAL HIGH (ref 65–99)
Potassium: 4.2 mmol/L (ref 3.5–5.2)
Sodium: 142 mmol/L (ref 134–144)

## 2015-07-11 ENCOUNTER — Ambulatory Visit (HOSPITAL_BASED_OUTPATIENT_CLINIC_OR_DEPARTMENT_OTHER): Payer: Medicare HMO | Admitting: Oncology

## 2015-07-11 ENCOUNTER — Inpatient Hospital Stay: Payer: Medicare HMO

## 2015-07-11 VITALS — BP 148/75 | HR 65 | Temp 97.0°F | Resp 18

## 2015-07-11 DIAGNOSIS — C9110 Chronic lymphocytic leukemia of B-cell type not having achieved remission: Secondary | ICD-10-CM

## 2015-07-11 DIAGNOSIS — D696 Thrombocytopenia, unspecified: Secondary | ICD-10-CM

## 2015-07-11 DIAGNOSIS — R5383 Other fatigue: Secondary | ICD-10-CM | POA: Diagnosis not present

## 2015-07-11 DIAGNOSIS — M542 Cervicalgia: Secondary | ICD-10-CM

## 2015-07-11 DIAGNOSIS — F17210 Nicotine dependence, cigarettes, uncomplicated: Secondary | ICD-10-CM

## 2015-07-11 DIAGNOSIS — K219 Gastro-esophageal reflux disease without esophagitis: Secondary | ICD-10-CM

## 2015-07-11 DIAGNOSIS — R5381 Other malaise: Secondary | ICD-10-CM

## 2015-07-11 DIAGNOSIS — R531 Weakness: Secondary | ICD-10-CM

## 2015-07-11 DIAGNOSIS — M8580 Other specified disorders of bone density and structure, unspecified site: Secondary | ICD-10-CM

## 2015-07-11 DIAGNOSIS — R110 Nausea: Secondary | ICD-10-CM

## 2015-07-11 DIAGNOSIS — Z5112 Encounter for antineoplastic immunotherapy: Secondary | ICD-10-CM | POA: Diagnosis not present

## 2015-07-11 DIAGNOSIS — F419 Anxiety disorder, unspecified: Secondary | ICD-10-CM

## 2015-07-11 DIAGNOSIS — Z7982 Long term (current) use of aspirin: Secondary | ICD-10-CM

## 2015-07-11 DIAGNOSIS — Z79899 Other long term (current) drug therapy: Secondary | ICD-10-CM

## 2015-07-11 LAB — CBC WITH DIFFERENTIAL/PLATELET
Basophils Absolute: 0.1 10*3/uL (ref 0–0.1)
Basophils Relative: 0 %
Eosinophils Absolute: 0.4 10*3/uL (ref 0–0.7)
Eosinophils Relative: 1 %
HCT: 44.9 % (ref 35.0–47.0)
Hemoglobin: 15 g/dL (ref 12.0–16.0)
Lymphocytes Relative: 91 %
Lymphs Abs: 72.9 10*3/uL — ABNORMAL HIGH (ref 1.0–3.6)
MCH: 32.9 pg (ref 26.0–34.0)
MCHC: 33.4 g/dL (ref 32.0–36.0)
MCV: 98.6 fL (ref 80.0–100.0)
Monocytes Absolute: 1.7 10*3/uL — ABNORMAL HIGH (ref 0.2–0.9)
Monocytes Relative: 2 %
Neutro Abs: 4.8 10*3/uL (ref 1.4–6.5)
Neutrophils Relative %: 6 %
Platelets: 140 10*3/uL — ABNORMAL LOW (ref 150–440)
RBC: 4.55 MIL/uL (ref 3.80–5.20)
RDW: 13.3 % (ref 11.5–14.5)
WBC: 79.9 10*3/uL (ref 3.6–11.0)

## 2015-07-11 MED ORDER — DIPHENHYDRAMINE HCL 25 MG PO CAPS
25.0000 mg | ORAL_CAPSULE | Freq: Once | ORAL | Status: AC
  Administered 2015-07-11: 25 mg via ORAL
  Filled 2015-07-11: qty 1

## 2015-07-11 MED ORDER — SODIUM CHLORIDE 0.9 % IV SOLN
Freq: Once | INTRAVENOUS | Status: AC
  Administered 2015-07-11: 10:00:00 via INTRAVENOUS
  Filled 2015-07-11: qty 1000

## 2015-07-11 MED ORDER — SODIUM CHLORIDE 0.9 % IV SOLN
375.0000 mg/m2 | Freq: Once | INTRAVENOUS | Status: AC
  Administered 2015-07-11: 700 mg via INTRAVENOUS
  Filled 2015-07-11: qty 70

## 2015-07-11 MED ORDER — SODIUM CHLORIDE 0.9 % IV SOLN: 375.0000 mg/m2 | Freq: Once | INTRAVENOUS | Status: DC

## 2015-07-11 MED ORDER — ACETAMINOPHEN 325 MG PO TABS
650.0000 mg | ORAL_TABLET | Freq: Once | ORAL | Status: AC
  Administered 2015-07-11: 650 mg via ORAL
  Filled 2015-07-11: qty 2

## 2015-07-11 NOTE — Progress Notes (Signed)
[place]  [phone] [phone]) [phone]  ID: Katherine [name] OB: 03-12-1940  MR#: [id number]  [id number]  Patient Care Team: [name], MD as PCP - General (Family Medicine) [name], MD as PCP - Family Medicine (Family Medicine) Forest Gleason, MD (Oncology) Oneta Rack, MD (Dermatology)  CHIEF COMPLAINT:  No chief complaint on file.   INTERVAL HISTORY: Patient returns to clinic today to continue with cycle 3 of 4 of weekly Rituxan for her progressive CLL. She is having significant fatigue, but otherwise is tolerating her treatments well.  She denies any recent fevers or illnesses. She has a good appetite and denies weight loss. She denies night sweats. She has no chest pain or shortness of breath. She denies any nausea, vomiting, constipation, or diarrhea. Patient offers no further specific complaints today.  REVIEW OF SYSTEMS:   Review of Systems  Constitutional: Positive for malaise/fatigue. Negative for fever and weight loss.  Respiratory: Negative.  Negative for cough and shortness of breath.   Cardiovascular: Negative.  Negative for chest pain.  Gastrointestinal: Negative.   Genitourinary: Negative.   Musculoskeletal: Negative.   Neurological: Positive for weakness.  Psychiatric/Behavioral: Negative.     As per HPI. Otherwise, a complete review of systems is negatve.  PAST MEDICAL HISTORY: Past Medical History  Diagnosis Date  . GERD (gastroesophageal reflux disease)   . Allergy   . Depression   . Osteopenia   . Lobar pneumonia ([place])   . Leukemia, lymphoid ([place])     PAST SURGICAL HISTORY: Past Surgical History  Procedure Laterality Date  . Appendectomy    . Abdominal hysterectomy    . Colon surgery      sigmoid resection  . Spine surgery      L4-5  . Breast biopsy Left     bx x 3-neg    FAMILY HISTORY Family History  Problem Relation Age of Onset  . Osteoporosis Mother   . Hypertension  Father   . Heart attack Father   . Stroke Maternal Grandfather   . Breast cancer Sister 51       ADVANCED DIRECTIVES:    HEALTH MAINTENANCE: Social History  Substance Use Topics  . Smoking status: Current Every Day Smoker -- 1.00 packs/day for 35 years    Types: Cigarettes  . Smokeless tobacco: Never Used  . Alcohol Use: No     Allergies  Allergen Reactions  . Augmentin [Amoxicillin-Pot Clavulanate] Swelling    tongue  . Biaxin [Clarithromycin] Swelling and Rash    tongue    Current Outpatient Prescriptions  Medication Sig Dispense Refill  . albuterol (PROVENTIL HFA;VENTOLIN HFA) 108 (90 BASE) MCG/ACT inhaler Inhale 1-2 puffs into the lungs every 6 (six) hours as needed for wheezing or shortness of breath. Dispense with aerochamber 1 Inhaler 0  . aspirin EC 81 MG tablet Take 81 mg by mouth daily.    . benazepril (LOTENSIN) 40 MG tablet Take 1 tablet (40 mg total) by mouth daily. 90 tablet 4  . LORazepam (ATIVAN) 1 MG tablet Take 0.5 tablets (0.5 mg total) by mouth 2 (two) times daily as needed for anxiety. 90 tablet 1  . omeprazole (PRILOSEC) 20 MG capsule Take 1 capsule (20 mg total) by mouth daily as needed. 90 capsule 4  . PARoxetine (PAXIL) 30 MG tablet TAKE 1 TABLET BY MOUTH EVERY DAY 30 tablet 6  . Triamcinolone Acetonide (NASACORT AQ NA) Place into the nose daily.     No current facility-administered  medications for this visit.    OBJECTIVE: There were no vitals filed for this visit.   There is no weight on file to calculate BMI.    ECOG FS:0 - Asymptomatic  General: Well-developed, well-nourished, no acute distress. Eyes: anicteric sclera. Lungs: Bilateral inspiratory wheeze, diminished throughout. Heart: Regular rate and rhythm. No rubs, murmurs, or gallops. Musculoskeletal: No edema, cyanosis, or clubbing. Neuro: Alert, answering all questions appropriately. Cranial nerves grossly intact. Skin: No rashes or petechiae noted. Psych: Normal affect.   LAB  RESULTS:  Lab Results  Component Value Date   NA 142 07/08/2015   K 4.2 07/08/2015   CL 103 07/08/2015   CO2 25 07/08/2015   GLUCOSE 106* 07/08/2015   BUN 10 07/08/2015   CREATININE 0.81 07/08/2015   CALCIUM 9.7 07/08/2015   PROT 6.1 01/06/2015   ALBUMIN 4.6 01/06/2015   AST 18 01/06/2015   ALT 16 01/06/2015   ALKPHOS 93 01/06/2015   BILITOT 0.7 01/06/2015   GFRNONAA 72 07/08/2015   GFRAA 83 07/08/2015    Lab Results  Component Value Date   WBC 79.9* 07/11/2015   NEUTROABS 4.8 07/11/2015   HGB 15.0 07/11/2015   HCT 44.9 07/11/2015   MCV 98.6 07/11/2015   PLT 140* 07/11/2015     STUDIES: No results found.  ASSESSMENT: CLL, Rai stage 0  PLAN:    1. CLL: Proceed with cycle 3 of 4 weekly Rituxan treatments. Her white blood cell is now declining 79.9. Return to clinic in 1 week for consideration of cycle 4.  2. Thrombocytopenia: Mild.  Likely secondary to progressive CLL, Rituxan as above.  3. Weakness/fatigue: Multifactorial, including secondary to Rituxan infusions.  monitor.  4. Tobacco abuse. CT screening discussed last week. Patient plans to get a referral from her primary care physician.   Patient expressed understanding and was in agreement with this plan. She also understands that She can call clinic at any time with any questions, concerns, or complaints.   Lloyd Huger, MD   07/11/2015 5:35 PM

## 2015-07-17 ENCOUNTER — Other Ambulatory Visit: Payer: Self-pay | Admitting: *Deleted

## 2015-07-17 DIAGNOSIS — C9110 Chronic lymphocytic leukemia of B-cell type not having achieved remission: Secondary | ICD-10-CM

## 2015-07-18 ENCOUNTER — Inpatient Hospital Stay: Payer: Medicare HMO

## 2015-07-18 ENCOUNTER — Inpatient Hospital Stay (HOSPITAL_BASED_OUTPATIENT_CLINIC_OR_DEPARTMENT_OTHER): Payer: Medicare HMO | Admitting: Oncology

## 2015-07-18 VITALS — BP 128/82 | HR 72 | Temp 97.6°F

## 2015-07-18 DIAGNOSIS — K219 Gastro-esophageal reflux disease without esophagitis: Secondary | ICD-10-CM

## 2015-07-18 DIAGNOSIS — R5383 Other fatigue: Secondary | ICD-10-CM

## 2015-07-18 DIAGNOSIS — D696 Thrombocytopenia, unspecified: Secondary | ICD-10-CM

## 2015-07-18 DIAGNOSIS — R5381 Other malaise: Secondary | ICD-10-CM

## 2015-07-18 DIAGNOSIS — Z7982 Long term (current) use of aspirin: Secondary | ICD-10-CM

## 2015-07-18 DIAGNOSIS — C9110 Chronic lymphocytic leukemia of B-cell type not having achieved remission: Secondary | ICD-10-CM

## 2015-07-18 DIAGNOSIS — F17210 Nicotine dependence, cigarettes, uncomplicated: Secondary | ICD-10-CM

## 2015-07-18 DIAGNOSIS — Z79899 Other long term (current) drug therapy: Secondary | ICD-10-CM

## 2015-07-18 DIAGNOSIS — Z5112 Encounter for antineoplastic immunotherapy: Secondary | ICD-10-CM | POA: Diagnosis not present

## 2015-07-18 DIAGNOSIS — F419 Anxiety disorder, unspecified: Secondary | ICD-10-CM

## 2015-07-18 DIAGNOSIS — M8580 Other specified disorders of bone density and structure, unspecified site: Secondary | ICD-10-CM

## 2015-07-18 DIAGNOSIS — R531 Weakness: Secondary | ICD-10-CM

## 2015-07-18 LAB — CBC WITH DIFFERENTIAL/PLATELET
Basophils Absolute: 0.1 10*3/uL (ref 0–0.1)
Basophils Relative: 0 %
Eosinophils Absolute: 0.1 10*3/uL (ref 0–0.7)
Eosinophils Relative: 0 %
HCT: 42.6 % (ref 35.0–47.0)
Hemoglobin: 14.2 g/dL (ref 12.0–16.0)
Lymphocytes Relative: 91 %
Lymphs Abs: 41.8 10*3/uL — ABNORMAL HIGH (ref 1.0–3.6)
MCH: 32.8 pg (ref 26.0–34.0)
MCHC: 33.3 g/dL (ref 32.0–36.0)
MCV: 98.5 fL (ref 80.0–100.0)
Monocytes Absolute: 1.1 10*3/uL — ABNORMAL HIGH (ref 0.2–0.9)
Monocytes Relative: 2 %
Neutro Abs: 3 10*3/uL (ref 1.4–6.5)
Neutrophils Relative %: 7 %
Platelets: 135 10*3/uL — ABNORMAL LOW (ref 150–440)
RBC: 4.32 MIL/uL (ref 3.80–5.20)
RDW: 13.3 % (ref 11.5–14.5)
WBC: 46.2 10*3/uL — ABNORMAL HIGH (ref 3.6–11.0)

## 2015-07-18 MED ORDER — ACETAMINOPHEN 325 MG PO TABS
650.0000 mg | ORAL_TABLET | Freq: Once | ORAL | Status: AC
  Administered 2015-07-18: 650 mg via ORAL

## 2015-07-18 MED ORDER — SODIUM CHLORIDE 0.9 % IV SOLN
Freq: Once | INTRAVENOUS | Status: AC
  Administered 2015-07-18: 10:00:00 via INTRAVENOUS
  Filled 2015-07-18: qty 1000

## 2015-07-18 MED ORDER — DIPHENHYDRAMINE HCL 25 MG PO CAPS
25.0000 mg | ORAL_CAPSULE | Freq: Once | ORAL | Status: AC
  Administered 2015-07-18: 25 mg via ORAL

## 2015-07-18 MED ORDER — SODIUM CHLORIDE 0.9 % IV SOLN
375.0000 mg/m2 | Freq: Once | INTRAVENOUS | Status: AC
  Administered 2015-07-18: 700 mg via INTRAVENOUS
  Filled 2015-07-18: qty 70

## 2015-07-18 MED ORDER — SODIUM CHLORIDE 0.9 % IV SOLN: 375.0000 mg/m2 | Freq: Once | INTRAVENOUS | Status: DC

## 2015-07-18 NOTE — Progress Notes (Signed)
[place]  [phone]) [phone] [phone]) [phone]  ID: Katherine [name] OB: [birth year]  MR#: [id number]  [id number]  Patient Care Team: [name], MD as PCP - General (Family Medicine) [name], MD as PCP - Family Medicine (Family Medicine) Forest Gleason, MD (Oncology) Oneta Rack, MD (Dermatology)  CHIEF COMPLAINT:  Chief Complaint  Patient presents with  . CLL    INTERVAL HISTORY: Patient returns to clinic today to continue with cycle 4 of 4 of weekly Rituxan for her progressive CLL. Her fatigue is significantly improved, but still mildly evident. She is otherwise tolerating her treatments well.  She denies any recent fevers or illnesses. She has a good appetite and denies weight loss. She denies night sweats. She has no chest pain or shortness of breath. She denies any nausea, vomiting, constipation, or diarrhea. Patient offers no further specific complaints today.  REVIEW OF SYSTEMS:   Review of Systems  Constitutional: Positive for malaise/fatigue. Negative for fever and weight loss.  Respiratory: Negative.  Negative for cough and shortness of breath.   Cardiovascular: Negative.  Negative for chest pain.  Gastrointestinal: Negative.   Genitourinary: Negative.   Musculoskeletal: Negative.   Neurological: Positive for weakness.  Psychiatric/Behavioral: Negative.     As per HPI. Otherwise, a complete review of systems is negatve.  PAST MEDICAL HISTORY: Past Medical History  Diagnosis Date  . GERD (gastroesophageal reflux disease)   . Allergy   . Depression   . Osteopenia   . Lobar pneumonia ([place])   . Leukemia, lymphoid ([place])     PAST SURGICAL HISTORY: Past Surgical History  Procedure Laterality Date  . Appendectomy    . Abdominal hysterectomy    . Colon surgery      sigmoid resection  . Spine surgery      L4-5  . Breast biopsy Left     bx x 3-neg    FAMILY HISTORY Family History  Problem Relation Age of  Onset  . Osteoporosis Mother   . Hypertension Father   . Heart attack Father   . Stroke Maternal Grandfather   . Breast cancer Sister 67       ADVANCED DIRECTIVES:    HEALTH MAINTENANCE: Social History  Substance Use Topics  . Smoking status: Current Every Day Smoker -- 1.00 packs/day for 35 years    Types: Cigarettes  . Smokeless tobacco: Never Used  . Alcohol Use: No     Allergies  Allergen Reactions  . Augmentin [Amoxicillin-Pot Clavulanate] Swelling    tongue  . Biaxin [Clarithromycin] Swelling and Rash    tongue    Current Outpatient Prescriptions  Medication Sig Dispense Refill  . albuterol (PROVENTIL HFA;VENTOLIN HFA) 108 (90 BASE) MCG/ACT inhaler Inhale 1-2 puffs into the lungs every 6 (six) hours as needed for wheezing or shortness of breath. Dispense with aerochamber 1 Inhaler 0  . aspirin EC 81 MG tablet Take 81 mg by mouth daily.    . benazepril (LOTENSIN) 40 MG tablet Take 1 tablet (40 mg total) by mouth daily. 90 tablet 4  . LORazepam (ATIVAN) 1 MG tablet Take 0.5 tablets (0.5 mg total) by mouth 2 (two) times daily as needed for anxiety. 90 tablet 1  . omeprazole (PRILOSEC) 20 MG capsule Take 1 capsule (20 mg total) by mouth daily as needed. 90 capsule 4  . PARoxetine (PAXIL) 30 MG tablet TAKE 1 TABLET BY MOUTH EVERY DAY 30 tablet 6  . Triamcinolone Acetonide (NASACORT AQ NA) Place into the  nose daily.     No current facility-administered medications for this visit.    OBJECTIVE: There were no vitals filed for this visit.   There is no weight on file to calculate BMI.    ECOG FS:0 - Asymptomatic  General: Well-developed, well-nourished, no acute distress. Eyes: anicteric sclera. Lungs: Bilateral inspiratory wheeze, diminished throughout. Heart: Regular rate and rhythm. No rubs, murmurs, or gallops. Musculoskeletal: No edema, cyanosis, or clubbing. Neuro: Alert, answering all questions appropriately. Cranial nerves grossly intact. Skin: No rashes or  petechiae noted. Psych: Normal affect.   LAB RESULTS:  Lab Results  Component Value Date   NA 142 07/08/2015   K 4.2 07/08/2015   CL 103 07/08/2015   CO2 25 07/08/2015   GLUCOSE 106* 07/08/2015   BUN 10 07/08/2015   CREATININE 0.81 07/08/2015   CALCIUM 9.7 07/08/2015   PROT 6.1 01/06/2015   ALBUMIN 4.6 01/06/2015   AST 18 01/06/2015   ALT 16 01/06/2015   ALKPHOS 93 01/06/2015   BILITOT 0.7 01/06/2015   GFRNONAA 72 07/08/2015   GFRAA 83 07/08/2015    Lab Results  Component Value Date   WBC 46.2* 07/18/2015   NEUTROABS 3.0 07/18/2015   HGB 14.2 07/18/2015   HCT 42.6 07/18/2015   MCV 98.5 07/18/2015   PLT 135* 07/18/2015     STUDIES: No results found.  ASSESSMENT: CLL, Rai stage 0  PLAN:    1. CLL: Proceed with cycle 4 of 4 weekly Rituxan treatments. Her white blood cell is now declining 46.2. Return to clinic in 1 month for laboratory work and then in 2 months for laboratory work and further evaluation. 2. Thrombocytopenia: Mild.  Likely secondary to progressive CLL, Rituxan as above.  3. Weakness/fatigue: Improving.  Multifactorial, including secondary to Rituxan infusions.  4. Tobacco abuse: CT screening previously discussed and patient plans to get a referral from her primary care physician.   Patient expressed understanding and was in agreement with this plan. She also understands that She can call clinic at any time with any questions, concerns, or complaints.   Lloyd Huger, MD   07/18/2015 5:00 PM

## 2015-07-30 ENCOUNTER — Encounter: Payer: Self-pay | Admitting: General Surgery

## 2015-07-30 ENCOUNTER — Telehealth: Payer: Self-pay | Admitting: General Surgery

## 2015-07-30 NOTE — Telephone Encounter (Signed)
08-29-14 @ 11:05AM L/M  AT HOME # ASKING HER TO RET CALL. WE CAN [place] HER AN APPT WITH DR BYRNETT FOR EVAL COLONOSCOPY.SHE IS IN OUR RECALL FOR 10-2015 ONCE APPT MADE WE CAN DELETE HER FROM RECALLS.SHE HAD HER COLONOSCOPY WITH [place] 10-31-06 & ONE WITH DR Manfred Arch  06-30-10(WE HAVE COLOR COPY/I HAVE GIVEN THIS TO EMILY TO HAVE WHEN APPT MADE.SO,PLEASE LET HER KNOW WHEN.

## 2015-08-08 ENCOUNTER — Other Ambulatory Visit: Payer: Medicare HMO

## 2015-08-08 ENCOUNTER — Ambulatory Visit: Payer: Medicare HMO | Admitting: Oncology

## 2015-08-13 ENCOUNTER — Encounter: Payer: Self-pay | Admitting: General Surgery

## 2015-08-14 ENCOUNTER — Encounter: Payer: Self-pay | Admitting: General Surgery

## 2015-08-14 ENCOUNTER — Ambulatory Visit (INDEPENDENT_AMBULATORY_CARE_PROVIDER_SITE_OTHER): Payer: Medicare HMO | Admitting: General Surgery

## 2015-08-14 VITALS — BP 128/74 | HR 76 | Resp 14 | Ht 67.0 in | Wt 167.0 lb

## 2015-08-14 DIAGNOSIS — Z860100 Personal history of colon polyps, unspecified: Secondary | ICD-10-CM

## 2015-08-14 MED ORDER — POLYETHYLENE GLYCOL 3350 17 GM/SCOOP PO POWD: 1.0000 | Freq: Once | ORAL | Status: DC

## 2015-08-14 NOTE — Progress Notes (Signed)
Patient ID: Katherine [name], female   DOB: March 28, 1940, 75 y.o.   MRN: [id number]  Chief Complaint  Patient presents with  . Other    colonoscopy    HPI Katherine [name] is a 75 y.o. female here today for a evaluation of a screening colonoscopy. Last colonoscopy was on 06/2010. Patient states she is doing well with no GI problem at this time.  I personally reviewed the patient's history. HPI  Past Medical History  Diagnosis Date  . GERD (gastroesophageal reflux disease)   . Allergy   . Depression   . Osteopenia   . Lobar pneumonia ([place])   . Leukemia, lymphoid ([place])   . Hemorrhoids     Past Surgical History  Procedure Laterality Date  . Appendectomy    . Abdominal hysterectomy    . Colon surgery      sigmoid resection  . Spine surgery      L4-5  . Breast biopsy Left     bx x 3-neg  . Colonoscopy      2007, 2012    Family History  Problem Relation Age of Onset  . Osteoporosis Mother   . Hypertension Father   . Heart attack Father   . Stroke Maternal Grandfather   . Breast cancer Sister 65    Social History Social History  Substance Use Topics  . Smoking status: Current Every Day Smoker -- 1.00 packs/day for 35 years    Types: Cigarettes  . Smokeless tobacco: Never Used  . Alcohol Use: No    Allergies  Allergen Reactions  . Augmentin [Amoxicillin-Pot Clavulanate] Swelling    tongue  . Biaxin [Clarithromycin] Swelling and Rash    tongue    Current Outpatient Prescriptions  Medication Sig Dispense Refill  . albuterol (PROVENTIL HFA;VENTOLIN HFA) 108 (90 BASE) MCG/ACT inhaler Inhale 1-2 puffs into the lungs every 6 (six) hours as needed for wheezing or shortness of breath. Dispense with aerochamber 1 Inhaler 0  . aspirin EC 81 MG tablet Take 81 mg by mouth daily.    . benazepril (LOTENSIN) 40 MG tablet Take 1 tablet (40 mg total) by mouth daily. 90 tablet 4  . Calcium-Vitamin D-Vitamin K (CALCIUM + D + K PO) Take by mouth.    Marland Kitchen LORazepam (ATIVAN) 1  MG tablet Take 0.5 tablets (0.5 mg total) by mouth 2 (two) times daily as needed for anxiety. 90 tablet 1  . Multiple Vitamins-Minerals (MULTIVITAMIN WITH MINERALS) tablet Take 1 tablet by mouth daily.    Marland Kitchen omeprazole (PRILOSEC) 20 MG capsule Take 1 capsule (20 mg total) by mouth daily as needed. 90 capsule 4  . PARoxetine (PAXIL) 30 MG tablet TAKE 1 TABLET BY MOUTH EVERY DAY 30 tablet 6  . Triamcinolone Acetonide (NASACORT AQ NA) Place into the nose daily.    . polyethylene glycol powder (GLYCOLAX/MIRALAX) powder Take 255 g by mouth once. 255 g 0   No current facility-administered medications for this visit.    Review of Systems Review of Systems  Constitutional: Negative.   Respiratory: Negative.   Cardiovascular: Negative.     Blood pressure 128/74, pulse 76, resp. rate 14, height 5\' 7"  (1.702 m), weight 167 lb (75.751 kg).  Physical Exam Physical Exam  Constitutional: She is oriented to person, place, and time. She appears well-developed and well-nourished.  Eyes: Conjunctivae are normal. No scleral icterus.  Neck: Neck supple.  Cardiovascular: Normal rate, regular rhythm and normal heart sounds.   Pulmonary/Chest: Effort normal and breath sounds normal.  Abdominal: Soft. Bowel sounds are normal. There is no tenderness.  Lymphadenopathy:    She has no cervical adenopathy.  Neurological: She is alert and oriented to person, place, and time.  Skin: Skin is warm and dry.    Data Reviewed  Colonoscopy completed 10/01/2005 showed multiple polyps within the rectum. Multiple hyperplastic polyps were identified.  Colonoscopy completed 11/10/2006 completed by Dr. Allen Norris identified a 10 mm polyp in the sigmoid colon. Hyperplastic polyp identified.  Colonoscopy of 06/30/2010 completed for rectal bleeding showed diverticulosis. No polyps identified on that exam. Completed by Dr. Dionne Milo.   CBC and basic metabolic panel of May 0000000 obtained by Dr. Grayland Ormond notable for a markedly  elevated white blood cell count consistent with her CLL. Platelet count stable greater than 130,000. Normal renal function. The patient has recently completed cycle 4 of Rituxan for progressive CLL.   Assessment    Candidate for follow-up colonoscopy.  Ongoing therapy for CLL.     Plan    During her visit the patient failed to mention that she was undergoing active treatment for her CLL. I attempted to contact Dr. Grayland Ormond today but was unsuccessful. We will try to contact him again to determine if a screening exam should be postponed during her present therapy. If it is acceptable to proceed, the patient has been instructed in preparation for the exam.   Colonoscopy with possible biopsy/polypectomy prn: Information regarding the procedure, including its potential risks and complications (including but not limited to perforation of the bowel, which may require emergency surgery to repair, and bleeding) was verbally given to the patient. Educational information regarding lower intestinal endoscopy was given to the patient. Written instructions for how to complete the bowel prep using Miralax were provided. The importance of drinking ample fluids to avoid dehydration as a result of the prep emphasized.  The patient is scheduled for a Colonoscopy at [facility] on 09/17/15. They are aware to call the day before to get their arrival time. She will only take her Benazepril at 6 am with a small sip of water the morning of her procedure. Miralax prescription has been sent into the patient's pharmacy. The patient is aware of date and instructions.    PCP:  [facility] Pop A  This information has been scribed by Gaspar Cola CMA.     Robert Bellow 08/14/2015, 8:27 PM

## 2015-08-14 NOTE — Patient Instructions (Addendum)
Colonoscopy A colonoscopy is an exam to look at the entire large intestine (colon). This exam can help find problems such as tumors, polyps, inflammation, and areas of bleeding. The exam takes about 1 hour.  LET [facility] CARE PROVIDER KNOW ABOUT:   Any allergies you have.  All medicines you are taking, including vitamins, herbs, eye drops, creams, and over-the-counter medicines.  Previous problems you or members of your family have had with the use of anesthetics.  Any blood disorders you have.  Previous surgeries you have had.  Medical conditions you have. RISKS AND COMPLICATIONS  Generally, this is a safe procedure. However, as with any procedure, complications can occur. Possible complications include:  Bleeding.  Tearing or rupture of the colon wall.  Reaction to medicines given during the exam.  Infection (rare). BEFORE THE PROCEDURE   Ask your health care provider about changing or stopping your regular medicines.  You may be prescribed an oral bowel prep. This involves drinking a large amount of medicated liquid, starting the day before your procedure. The liquid will cause you to have multiple loose stools until your stool is almost clear or light green. This cleans out your colon in preparation for the procedure.  Do not eat or drink anything else once you have started the bowel prep, unless your health care provider tells you it is safe to do so.  Arrange for someone to drive you home after the procedure. PROCEDURE   You will be given medicine to help you relax (sedative).  You will lie on your side with your knees bent.  A long, flexible tube with a light and camera on the end (colonoscope) will be inserted through the rectum and into the colon. The camera sends video back to a computer screen as it moves through the colon. The colonoscope also releases carbon dioxide gas to inflate the colon. This helps your health care provider see the area better.  During  the exam, your health care provider may take a small tissue sample (biopsy) to be examined under a microscope if any abnormalities are found.  The exam is finished when the entire colon has been viewed. AFTER THE PROCEDURE   Do not drive for 24 hours after the exam.  You may have a small amount of blood in your stool.  You may pass moderate amounts of gas and have mild abdominal cramping or bloating. This is caused by the gas used to inflate your colon during the exam.  Ask when your test results will be ready and how you will get your results. Make sure you get your test results.   This information is not intended to replace advice given to you by your health care provider. Make sure you discuss any questions you have with your health care provider.   Document Released: 02/06/2000 Document Revised: 11/29/2012 Document Reviewed: 10/16/2012 Elsevier Interactive Patient Education [employer].  The patient is scheduled for a Colonoscopy at [facility] on 09/17/15. They are aware to call the day before to get their arrival time. She will only take her Benazepril at 6 am with a small sip of water the morning of her procedure. Miralax prescription has been sent into the patient's pharmacy. The patient is aware of date and instructions.

## 2015-08-15 ENCOUNTER — Inpatient Hospital Stay: Payer: Medicare HMO | Attending: Oncology

## 2015-08-15 DIAGNOSIS — C9110 Chronic lymphocytic leukemia of B-cell type not having achieved remission: Secondary | ICD-10-CM | POA: Insufficient documentation

## 2015-08-15 LAB — CBC WITH DIFFERENTIAL/PLATELET
Basophils Absolute: 0.1 10*3/uL (ref 0–0.1)
Basophils Relative: 0 %
Eosinophils Absolute: 0.2 10*3/uL (ref 0–0.7)
Eosinophils Relative: 1 %
HCT: 45.1 % (ref 35.0–47.0)
Hemoglobin: 15.3 g/dL (ref 12.0–16.0)
Lymphocytes Relative: 83 %
Lymphs Abs: 18.4 10*3/uL — ABNORMAL HIGH (ref 1.0–3.6)
MCH: 33.1 pg (ref 26.0–34.0)
MCHC: 33.9 g/dL (ref 32.0–36.0)
MCV: 97.6 fL (ref 80.0–100.0)
Monocytes Absolute: 0.6 10*3/uL (ref 0.2–0.9)
Monocytes Relative: 3 %
Neutro Abs: 3 10*3/uL (ref 1.4–6.5)
Neutrophils Relative %: 13 %
Platelets: 138 10*3/uL — ABNORMAL LOW (ref 150–440)
RBC: 4.62 MIL/uL (ref 3.80–5.20)
RDW: 13.3 % (ref 11.5–14.5)
WBC: 22.2 10*3/uL — ABNORMAL HIGH (ref 3.6–11.0)

## 2015-08-21 ENCOUNTER — Encounter: Payer: Self-pay | Admitting: *Deleted

## 2015-08-22 ENCOUNTER — Other Ambulatory Visit: Payer: Medicare HMO

## 2015-08-22 ENCOUNTER — Ambulatory Visit: Payer: Medicare HMO | Admitting: Oncology

## 2015-09-12 ENCOUNTER — Inpatient Hospital Stay (HOSPITAL_BASED_OUTPATIENT_CLINIC_OR_DEPARTMENT_OTHER): Payer: Medicare HMO | Admitting: Oncology

## 2015-09-12 ENCOUNTER — Telehealth: Payer: Self-pay

## 2015-09-12 ENCOUNTER — Inpatient Hospital Stay: Payer: Medicare HMO | Attending: Oncology

## 2015-09-12 VITALS — BP 127/74 | HR 83 | Temp 97.3°F | Resp 18 | Wt 166.8 lb

## 2015-09-12 DIAGNOSIS — D696 Thrombocytopenia, unspecified: Secondary | ICD-10-CM

## 2015-09-12 DIAGNOSIS — Z7982 Long term (current) use of aspirin: Secondary | ICD-10-CM | POA: Diagnosis not present

## 2015-09-12 DIAGNOSIS — R5383 Other fatigue: Secondary | ICD-10-CM | POA: Insufficient documentation

## 2015-09-12 DIAGNOSIS — K219 Gastro-esophageal reflux disease without esophagitis: Secondary | ICD-10-CM

## 2015-09-12 DIAGNOSIS — R5381 Other malaise: Secondary | ICD-10-CM | POA: Diagnosis not present

## 2015-09-12 DIAGNOSIS — C9110 Chronic lymphocytic leukemia of B-cell type not having achieved remission: Secondary | ICD-10-CM

## 2015-09-12 DIAGNOSIS — R531 Weakness: Secondary | ICD-10-CM | POA: Insufficient documentation

## 2015-09-12 DIAGNOSIS — F17210 Nicotine dependence, cigarettes, uncomplicated: Secondary | ICD-10-CM | POA: Insufficient documentation

## 2015-09-12 DIAGNOSIS — R69 Illness, unspecified: Secondary | ICD-10-CM | POA: Diagnosis not present

## 2015-09-12 DIAGNOSIS — Z79899 Other long term (current) drug therapy: Secondary | ICD-10-CM

## 2015-09-12 DIAGNOSIS — Z9221 Personal history of antineoplastic chemotherapy: Secondary | ICD-10-CM

## 2015-09-12 LAB — CBC WITH DIFFERENTIAL/PLATELET
Basophils Absolute: 0.1 10*3/uL (ref 0–0.1)
Basophils Relative: 1 %
Eosinophils Absolute: 0.2 10*3/uL (ref 0–0.7)
Eosinophils Relative: 1 %
HCT: 45.3 % (ref 35.0–47.0)
Hemoglobin: 15.6 g/dL (ref 12.0–16.0)
Lymphocytes Relative: 74 %
Lymphs Abs: 12.8 10*3/uL — ABNORMAL HIGH (ref 1.0–3.6)
MCH: 33.3 pg (ref 26.0–34.0)
MCHC: 34.4 g/dL (ref 32.0–36.0)
MCV: 96.9 fL (ref 80.0–100.0)
Monocytes Absolute: 0.7 10*3/uL (ref 0.2–0.9)
Monocytes Relative: 4 %
Neutro Abs: 3.5 10*3/uL (ref 1.4–6.5)
Neutrophils Relative %: 20 %
Platelets: 138 10*3/uL — ABNORMAL LOW (ref 150–440)
RBC: 4.68 MIL/uL (ref 3.80–5.20)
RDW: 12.9 % (ref 11.5–14.5)
WBC: 17.2 10*3/uL — ABNORMAL HIGH (ref 3.6–11.0)

## 2015-09-12 NOTE — Progress Notes (Signed)
[place]  Telephone:([phone]) [phone] Fax:(336) [phone]  ID: Katherine [name] OB: 05/17/1940  MR#: [id number]  [id number]  Patient Care Team: [name], MD as PCP - General (Family Medicine) [name], MD as PCP - Family Medicine (Family Medicine) Forest Gleason, MD (Oncology) Oneta Rack, MD (Dermatology) Manya Silvas, MD (Gastroenterology) Robert Bellow, MD (General Surgery)  CHIEF COMPLAINT:  Chief Complaint  Patient presents with  . CLL    INTERVAL HISTORY: Patient returns to clinic today for 2 months visit after finishing 4 cycles of Rituxan for CLL. She still complains of fatigue but otherwise she feels well.  She denies any recent fevers or illnesses. She has a good appetite and denies weight loss. She denies night sweats. She has no chest pain or shortness of breath. She denies any nausea, vomiting, constipation, or diarrhea. Patient offers no further specific complaints today.  REVIEW OF SYSTEMS:   Review of Systems  Constitutional: Positive for malaise/fatigue. Negative for fever and weight loss.  Respiratory: Negative.  Negative for cough and shortness of breath.   Cardiovascular: Negative.  Negative for chest pain.  Gastrointestinal: Negative.   Genitourinary: Negative.   Musculoskeletal: Negative.   Neurological: Positive for weakness.  Psychiatric/Behavioral: Negative.     As per HPI. Otherwise, a complete review of systems is negatve.  PAST MEDICAL HISTORY: Past Medical History  Diagnosis Date  . GERD (gastroesophageal reflux disease)   . Allergy   . Depression   . Osteopenia   . Lobar pneumonia ([place])   . Leukemia, lymphoid ([place])   . Hemorrhoids     PAST SURGICAL HISTORY: Past Surgical History  Procedure Laterality Date  . Appendectomy    . Abdominal hysterectomy    . Colon surgery      sigmoid resection  . Spine surgery      L4-5  . Breast biopsy Left     bx x 3-neg  . Colonoscopy      2007,  2012    FAMILY HISTORY Family History  Problem Relation Age of Onset  . Osteoporosis Mother   . Hypertension Father   . Heart attack Father   . Stroke Maternal Grandfather   . Breast cancer Sister 91       ADVANCED DIRECTIVES:    HEALTH MAINTENANCE: Social History  Substance Use Topics  . Smoking status: Current Every Day Smoker -- 1.00 packs/day for 35 years    Types: Cigarettes  . Smokeless tobacco: Never Used  . Alcohol Use: No     Allergies  Allergen Reactions  . Augmentin [Amoxicillin-Pot Clavulanate] Swelling    tongue  . Biaxin [Clarithromycin] Swelling and Rash    tongue    Current Outpatient Prescriptions  Medication Sig Dispense Refill  . albuterol (PROVENTIL HFA;VENTOLIN HFA) 108 (90 BASE) MCG/ACT inhaler Inhale 1-2 puffs into the lungs every 6 (six) hours as needed for wheezing or shortness of breath. Dispense with aerochamber 1 Inhaler 0  . aspirin EC 81 MG tablet Take 81 mg by mouth daily.    . benazepril (LOTENSIN) 40 MG tablet Take 1 tablet (40 mg total) by mouth daily. 90 tablet 4  . Calcium-Vitamin D-Vitamin K (CALCIUM + D + K PO) Take by mouth.    Marland Kitchen LORazepam (ATIVAN) 1 MG tablet Take 0.5 tablets (0.5 mg total) by mouth 2 (two) times daily as needed for anxiety. 90 tablet 1  . Multiple Vitamins-Minerals (MULTIVITAMIN WITH MINERALS) tablet Take 1 tablet by mouth daily.    Marland Kitchen  omeprazole (PRILOSEC) 20 MG capsule Take 1 capsule (20 mg total) by mouth daily as needed. 90 capsule 4  . PARoxetine (PAXIL) 30 MG tablet TAKE 1 TABLET BY MOUTH EVERY DAY 30 tablet 6  . polyethylene glycol powder (GLYCOLAX/MIRALAX) powder Take 255 g by mouth once. 255 g 0  . Triamcinolone Acetonide (NASACORT AQ NA) Place into the nose daily.     No current facility-administered medications for this visit.    OBJECTIVE: Filed Vitals:   09/12/15 0951  BP: 127/74  Pulse: 83  Temp: 97.3 F (36.3 C)  Resp: 18     Body mass index is 26.11 kg/(m^2).    ECOG FS:0 -  Asymptomatic  General: Well-developed, well-nourished, no acute distress. Eyes: anicteric sclera. Lungs: Bilateral inspiratory wheeze, diminished throughout. Heart: Regular rate and rhythm. No rubs, murmurs, or gallops. Musculoskeletal: No edema, cyanosis, or clubbing. Neuro: Alert, answering all questions appropriately. Cranial nerves grossly intact. Skin: No rashes or petechiae noted. Psych: Normal affect.   LAB RESULTS:  Lab Results  Component Value Date   NA 142 07/08/2015   K 4.2 07/08/2015   CL 103 07/08/2015   CO2 25 07/08/2015   GLUCOSE 106* 07/08/2015   BUN 10 07/08/2015   CREATININE 0.81 07/08/2015   CALCIUM 9.7 07/08/2015   PROT 6.1 01/06/2015   ALBUMIN 4.6 01/06/2015   AST 18 01/06/2015   ALT 16 01/06/2015   ALKPHOS 93 01/06/2015   BILITOT 0.7 01/06/2015   GFRNONAA 72 07/08/2015   GFRAA 83 07/08/2015    Lab Results  Component Value Date   WBC 17.2* 09/12/2015   NEUTROABS 3.5 09/12/2015   HGB 15.6 09/12/2015   HCT 45.3 09/12/2015   MCV 96.9 09/12/2015   PLT 138* 09/12/2015     STUDIES: No results found.  ASSESSMENT: CLL, Rai stage 0  PLAN:    1. CLL: Finished 4 cycles of weekly Rituxan treatments in May 2017. Her white blood cell has declined to 17.2  today. Return to clinic in 2 months for laboratory work and further evaluation. 2. Thrombocytopenia: Mild.  Likely secondary to progressive CLL. 3. Weakness/fatigue: Improving.  Multifactorial. 4. Tobacco abuse: CT screening previously discussed and patient plans to get a referral from her primary care physician.   Patient expressed understanding and was in agreement with this plan. She also understands that She can call clinic at any time with any questions, concerns, or complaints.   Dr Grayland Ormond was available for consultation and review of plan of care for this patient.  Mayra Reel, NP   09/12/2015 9:59 AM

## 2015-09-12 NOTE — Progress Notes (Signed)
Complains of fatigue.

## 2015-09-12 NOTE — Telephone Encounter (Signed)
Call to patient to see if she has had any medication changes since she was last seen here. Patient states that she has had no changes and she has no questions about her colonoscopy prep. Patient is aware of date and instructions.

## 2015-09-16 ENCOUNTER — Encounter: Payer: Self-pay | Admitting: *Deleted

## 2015-09-16 ENCOUNTER — Other Ambulatory Visit: Payer: Self-pay | Admitting: General Surgery

## 2015-09-16 DIAGNOSIS — Z86010 Personal history of colonic polyps: Secondary | ICD-10-CM

## 2015-09-17 ENCOUNTER — Ambulatory Visit: Payer: Medicare HMO | Admitting: Anesthesiology

## 2015-09-17 ENCOUNTER — Encounter
Admission: RE | Disposition: A | Discharge status: Home / Self Care | Payer: Self-pay | Source: Ambulatory Visit | Attending: General Surgery

## 2015-09-17 ENCOUNTER — Ambulatory Visit
Admission: RE | Admit: 2015-09-17 | Discharge: 2015-09-17 | Disposition: A | Discharge status: Home / Self Care | Payer: Medicare HMO | Source: Ambulatory Visit | Attending: General Surgery | Admitting: General Surgery

## 2015-09-17 DIAGNOSIS — K5730 Diverticulosis of large intestine without perforation or abscess without bleeding: Secondary | ICD-10-CM | POA: Diagnosis not present

## 2015-09-17 DIAGNOSIS — Z1211 Encounter for screening for malignant neoplasm of colon: Secondary | ICD-10-CM | POA: Insufficient documentation

## 2015-09-17 DIAGNOSIS — F17210 Nicotine dependence, cigarettes, uncomplicated: Secondary | ICD-10-CM | POA: Diagnosis not present

## 2015-09-17 DIAGNOSIS — Z856 Personal history of leukemia: Secondary | ICD-10-CM | POA: Insufficient documentation

## 2015-09-17 DIAGNOSIS — F419 Anxiety disorder, unspecified: Secondary | ICD-10-CM | POA: Insufficient documentation

## 2015-09-17 DIAGNOSIS — Z7982 Long term (current) use of aspirin: Secondary | ICD-10-CM | POA: Diagnosis not present

## 2015-09-17 DIAGNOSIS — M8580 Other specified disorders of bone density and structure, unspecified site: Secondary | ICD-10-CM | POA: Insufficient documentation

## 2015-09-17 DIAGNOSIS — R69 Illness, unspecified: Secondary | ICD-10-CM | POA: Diagnosis not present

## 2015-09-17 DIAGNOSIS — K219 Gastro-esophageal reflux disease without esophagitis: Secondary | ICD-10-CM | POA: Insufficient documentation

## 2015-09-17 DIAGNOSIS — Z86010 Personal history of colonic polyps: Secondary | ICD-10-CM | POA: Insufficient documentation

## 2015-09-17 DIAGNOSIS — F329 Major depressive disorder, single episode, unspecified: Secondary | ICD-10-CM | POA: Diagnosis not present

## 2015-09-17 DIAGNOSIS — Z79899 Other long term (current) drug therapy: Secondary | ICD-10-CM | POA: Diagnosis not present

## 2015-09-17 DIAGNOSIS — K5790 Diverticulosis of intestine, part unspecified, without perforation or abscess without bleeding: Secondary | ICD-10-CM | POA: Diagnosis not present

## 2015-09-17 HISTORY — DX: Anxiety disorder, unspecified: F41.9

## 2015-09-17 HISTORY — PX: COLONOSCOPY WITH PROPOFOL: SHX5780

## 2015-09-17 SURGERY — COLONOSCOPY WITH PROPOFOL
Anesthesia: General

## 2015-09-17 MED ORDER — FENTANYL CITRATE (PF) 100 MCG/2ML IJ SOLN
INTRAMUSCULAR | Status: DC | PRN
  Administered 2015-09-17: 50 ug via INTRAVENOUS

## 2015-09-17 MED ORDER — SODIUM CHLORIDE 0.9 % IV SOLN
INTRAVENOUS | Status: DC
  Administered 2015-09-17: 1000 mL via INTRAVENOUS

## 2015-09-17 MED ORDER — PROPOFOL 500 MG/50ML IV EMUL
INTRAVENOUS | Status: DC | PRN
  Administered 2015-09-17: 120 ug/kg/min via INTRAVENOUS

## 2015-09-17 MED ORDER — MIDAZOLAM HCL 2 MG/2ML IJ SOLN
INTRAMUSCULAR | Status: DC | PRN
  Administered 2015-09-17: 1 mg via INTRAVENOUS

## 2015-09-17 NOTE — Anesthesia Postprocedure Evaluation (Signed)
Anesthesia Post Note  Patient: Katherine [name]  Procedure(s) Performed: Procedure(s) (LRB): COLONOSCOPY WITH PROPOFOL (N/A)  Patient location during evaluation: PACU Anesthesia Type: General Level of consciousness: awake and alert and oriented Pain management: pain level controlled Vital Signs Assessment: post-procedure vital signs reviewed and stable Respiratory status: spontaneous breathing, nonlabored ventilation, respiratory function stable and patient connected to nasal cannula oxygen Cardiovascular status: blood pressure returned to baseline and stable Postop Assessment: no signs of nausea or vomiting Anesthetic complications: no    Last Vitals:  Vitals:   09/17/15 1050 09/17/15 1110  BP: (!) 91/55 133/69  Pulse: 63   Resp: 17   Temp: 36.2 C     Last Pain:  Vitals:   09/17/15 1050  TempSrc: Tympanic                 [name]

## 2015-09-17 NOTE — Anesthesia Preprocedure Evaluation (Signed)
Anesthesia Evaluation  Patient identified by MRN, date of birth, ID band Patient awake    Reviewed: Allergy & Precautions, NPO status , Patient's Chart, lab work & pertinent test results  History of Anesthesia Complications Negative for: history of anesthetic complications  Airway Mallampati: II  TM Distance: >3 FB Neck ROM: Full    Dental  (+) Partial Lower   Pulmonary neg sleep apnea, neg COPD, Current Smoker,    breath sounds clear to auscultation- rhonchi (-) wheezing      Cardiovascular Exercise Tolerance: Good hypertension, Pt. on medications (-) CAD and (-) Past MI  Rhythm:Regular Rate:Normal - Systolic murmurs and - Diastolic murmurs    Neuro/Psych Anxiety Depression    GI/Hepatic Neg liver ROS, GERD  Controlled,  Endo/Other  negative endocrine ROSneg diabetes  Renal/GU negative Renal ROS     Musculoskeletal negative musculoskeletal ROS (+)   Abdominal (+) - obese,   Peds  Hematology negative hematology ROS (+)   Anesthesia Other Findings Past Medical History: No date: Allergy No date: Anxiety No date: Depression No date: GERD (gastroesophageal reflux disease) No date: Hemorrhoids No date: Leukemia, lymphoid (H[facility]) No date: Lobar pneumonia ([facility]) No date: Osteopenia   Reproductive/Obstetrics                             Anesthesia Physical Anesthesia Plan  ASA: III  Anesthesia Plan: General   Post-op Pain Management:    Induction: Intravenous  Airway Management Planned: Natural Airway  Additional Equipment:   Intra-op Plan:   Post-operative Plan:   Informed Consent: I have reviewed the patients History and Physical, chart, labs and discussed the procedure including the risks, benefits and alternatives for the proposed anesthesia with the patient or authorized representative who has indicated his/her understanding and acceptance.     Plan Discussed with: CRNA  and Anesthesiologist  Anesthesia Plan Comments:         Anesthesia Quick Evaluation

## 2015-09-17 NOTE — H&P (Signed)
Katherine [name] [id number] [birth year]     HPI: Healthy 75 y/o woman for follow up colnooscopy. Polyp in 2008 (Dr Allen Norris), negative exam in 2012 ( Dr. Dionne Milo).  Five year f/u based on original polypectomy.   Prescriptions Prior to Admission  Medication Sig Dispense Refill Last Dose  . benazepril (LOTENSIN) 40 MG tablet Take 1 tablet (40 mg total) by mouth daily. 90 tablet 4 09/17/2015 at 0615  . albuterol (PROVENTIL HFA;VENTOLIN HFA) 108 (90 BASE) MCG/ACT inhaler Inhale 1-2 puffs into the lungs every 6 (six) hours as needed for wheezing or shortness of breath. Dispense with aerochamber 1 Inhaler 0 Taking  . aspirin EC 81 MG tablet Take 81 mg by mouth daily.   Taking  . Calcium-Vitamin D-Vitamin K (CALCIUM + D + K PO) Take by mouth.   Taking  . LORazepam (ATIVAN) 1 MG tablet Take 0.5 tablets (0.5 mg total) by mouth 2 (two) times daily as needed for anxiety. 90 tablet 1 Taking  . Multiple Vitamins-Minerals (MULTIVITAMIN WITH MINERALS) tablet Take 1 tablet by mouth daily.   Taking  . omeprazole (PRILOSEC) 20 MG capsule Take 1 capsule (20 mg total) by mouth daily as needed. 90 capsule 4 Taking  . PARoxetine (PAXIL) 30 MG tablet TAKE 1 TABLET BY MOUTH EVERY DAY 30 tablet 6 Taking  . polyethylene glycol powder (GLYCOLAX/MIRALAX) powder Take 255 g by mouth once. 255 g 0 Taking  . Triamcinolone Acetonide (NASACORT AQ NA) Place into the nose daily.   Taking   Allergies  Allergen Reactions  . Augmentin [Amoxicillin-Pot Clavulanate] Swelling    tongue  . Biaxin [Clarithromycin] Swelling and Rash    tongue   Past Medical History:  Diagnosis Date  . Allergy   . Anxiety   . Depression   . GERD (gastroesophageal reflux disease)   . Hemorrhoids   . Leukemia, lymphoid ([place])   . Lobar pneumonia ([place])   . Osteopenia    Past Surgical History:  Procedure Laterality Date  . ABDOMINAL HYSTERECTOMY    . APPENDECTOMY    . BREAST BIOPSY Left    bx x 3-neg  . COLON SURGERY     sigmoid resection   . COLONOSCOPY     2007, 2012  . SPINE SURGERY     L4-5   Social History   Social History  . Marital status: Married    Spouse name: N/A  . Number of children: N/A  . Years of education: N/A   Occupational History  . Not on file.   Social History Main Topics  . Smoking status: Current Every Day Smoker    Packs/day: 1.00    Years: 35.00    Types: Cigarettes  . Smokeless tobacco: Never Used  . Alcohol use No  . Drug use: No  . Sexual activity: Not on file   Other Topics Concern  . Not on file   Social History Narrative  . No narrative on file   Social History   Social History Narrative  . No narrative on file     ROS: Negative.     PE: HEENT: Negative. Lungs: Clear. Cardio: RR.  Assessment/Plan:  Proceed with planned endoscopy.   Robert Bellow 09/17/2015   Assessment/Plan:  Proceed with planned endoscopy.

## 2015-09-17 NOTE — Transfer of Care (Signed)
Immediate Anesthesia Transfer of Care Note  Patient: Katherine [name]  Procedure(s) Performed: Procedure(s): COLONOSCOPY WITH PROPOFOL (N/A)  Patient Location: PACU  Anesthesia Type:General  Level of Consciousness: awake and sedated  Airway & Oxygen Therapy: Patient Spontanous Breathing and Patient connected to nasal cannula oxygen  Post-op Assessment: Report given to RN and Post -op Vital signs reviewed and stable  Post vital signs: Reviewed and stable  Last Vitals:  Vitals:   09/17/15 0911  BP: 124/70  Pulse: 71  Resp: 17  Temp: 36.3 C    Last Pain:  Vitals:   09/17/15 0911  TempSrc: Tympanic         Complications: No apparent anesthesia complications

## 2015-09-17 NOTE — Anesthesia Procedure Notes (Signed)
Performed by: COOK-MARTIN, Roemello Speyer Pre-anesthesia Checklist: Patient identified, Emergency Drugs available, Suction available, Patient being monitored and Timeout performed Patient Re-evaluated:Patient Re-evaluated prior to inductionOxygen Delivery Method: Nasal cannula Preoxygenation: Pre-oxygenation with 100% oxygen Intubation Type: IV induction Placement Confirmation: CO2 detector and positive ETCO2       

## 2015-09-17 NOTE — Op Note (Signed)
[facility] Patient Name: Katherine [name] Procedure Date: 09/17/2015 10:20 AM MRN: [id number] Account #: [id number] Date of Birth: Sep 25, 1940 Admit Type: Outpatient Age: 75 Room: [facility] ENDO ROOM 1 Gender: Female Note Status: Finalized Procedure:            Colonoscopy Indications:          High risk colon cancer surveillance: Personal history                        of colonic polyps Providers:            Robert Bellow, MD Referring MD:         [name], MD (Referring MD) Medicines:            Monitored Anesthesia Care Complications:        No immediate complications. Procedure:            Pre-Anesthesia Assessment:                       - Prior to the procedure, a History and Physical was                        performed, and patient medications, allergies and                        sensitivities were reviewed. The patient's tolerance of                        previous anesthesia was reviewed.                       - The risks and benefits of the procedure and the                        sedation options and risks were discussed with the                        patient. All questions were answered and informed                        consent was obtained.                       After obtaining informed consent, the colonoscope was                        passed under direct vision. Throughout the procedure,                        the patient's blood pressure, pulse, and oxygen                        saturations were monitored continuously. The                        Colonoscope was introduced through the anus and                        advanced to the the cecum, identified by appendiceal  orifice and ileocecal valve. The colonoscopy was                        somewhat difficult due to multiple diverticula in the                        colon and significant looping. Successful completion of                        the  procedure was aided by applying abdominal pressure.                        The patient tolerated the procedure well. The quality                        of the bowel preparation was excellent. Findings:      Multiple medium-mouthed diverticula were found in the sigmoid colon.      The retroflexed view of the distal rectum and anal verge was normal and       showed no anal or rectal abnormalities. Impression:           - Diverticulosis in the sigmoid colon.                       - The distal rectum and anal verge are normal on                        retroflexion view.                       - No specimens collected. Recommendation:       - Discharge patient to home (via wheelchair).                       - Resume regular diet. Procedure Code(s):    --- Professional ---                       [phone], Colonoscopy, flexible; diagnostic, including                        collection of specimen(s) by brushing or washing, when                        performed (separate procedure) Diagnosis Code(s):    --- Professional ---                       Z86.010, Personal history of colonic polyps                       K57.30, Diverticulosis of large intestine without                        perforation or abscess without bleeding CPT copyright 2016 American Medical Association. All rights reserved. The codes documented in this report are preliminary and upon coder review may  be revised to meet current compliance requirements. Robert Bellow, MD 09/17/2015 10:48:10 AM This report has been signed electronically. Number of Addenda: 0 Note Initiated On: 09/17/2015 10:20 AM Scope Withdrawal Time: 0 hours 5 minutes 32 seconds  Total Procedure Duration: 0 hours 20 minutes 14  seconds       [facility]

## 2015-09-18 ENCOUNTER — Encounter: Payer: Self-pay | Admitting: General Surgery

## 2015-10-16 NOTE — Progress Notes (Unsigned)
PSN met with patient and husband today to apply for financial assistance through the [employer].

## 2015-11-11 ENCOUNTER — Telehealth: Payer: Self-pay | Admitting: Family Medicine

## 2015-11-11 NOTE — Telephone Encounter (Signed)
Pt would like to speak to PCP about when she should come get her flu shot as well as a personal matter.

## 2015-11-12 NOTE — Telephone Encounter (Signed)
Pt called stated she has a question about a personal matter. Please call ASAP. Thanks.

## 2015-11-19 NOTE — Progress Notes (Signed)
[place]  [phone] [phone]) [phone]  ID: Katherine [name] OB: 06-04-1940  MR#: [id number]  [id number]  Patient Care Team: [name], MD as PCP - General (Family Medicine) [name], MD as PCP - Family Medicine (Family Medicine) Forest Gleason, MD (Oncology) Oneta Rack, MD (Dermatology) Manya Silvas, MD (Gastroenterology) Robert Bellow, MD (General Surgery)  CHIEF COMPLAINT: CLL  INTERVAL HISTORY: Patient returns to clinic today for repeat laboratory work and further evaluation. She continues to have persistent weakness and fatigue, but otherwise she feels well.  She denies any recent fevers or illnesses. She has a fair appetite and denies weight loss. She denies night sweats. She has no chest pain or shortness of breath. She denies any nausea, vomiting, constipation, or diarrhea. She has no urinary complaints. Patient offers no further specific complaints today.  REVIEW OF SYSTEMS:   Review of Systems  Constitutional: Positive for malaise/fatigue. Negative for fever and weight loss.  Respiratory: Negative.  Negative for cough and shortness of breath.   Cardiovascular: Negative.  Negative for chest pain.  Gastrointestinal: Negative.   Genitourinary: Negative.   Musculoskeletal: Negative.   Neurological: Positive for weakness. Negative for sensory change.  Psychiatric/Behavioral: The patient has insomnia. The patient is not nervous/anxious.     As per HPI. Otherwise, a complete review of systems is negative.  PAST MEDICAL HISTORY: Past Medical History:  Diagnosis Date  . Allergy   . Anxiety   . Depression   . GERD (gastroesophageal reflux disease)   . Hemorrhoids   . Leukemia, lymphoid ([place])   . Lobar pneumonia ([place])   . Osteopenia     PAST SURGICAL HISTORY: Past Surgical History:  Procedure Laterality Date  . ABDOMINAL HYSTERECTOMY    . APPENDECTOMY    . BREAST BIOPSY Left    bx x 3-neg  . COLON  SURGERY     sigmoid resection  . COLONOSCOPY     2007, 2012  . COLONOSCOPY WITH PROPOFOL N/A 09/17/2015   Procedure: COLONOSCOPY WITH PROPOFOL;  Surgeon: Robert Bellow, MD;  Location: [facility] ENDOSCOPY;  Service: Endoscopy;  Laterality: N/A;  . SPINE SURGERY     L4-5    FAMILY HISTORY Family History  Problem Relation Age of Onset  . Osteoporosis Mother   . Hypertension Father   . Heart attack Father   . Stroke Maternal Grandfather   . Breast cancer Sister 68       ADVANCED DIRECTIVES:    HEALTH MAINTENANCE: Social History  Substance Use Topics  . Smoking status: Current Every Day Smoker    Packs/day: 1.00    Years: 35.00    Types: Cigarettes  . Smokeless tobacco: Never Used  . Alcohol use No     Allergies  Allergen Reactions  . Augmentin [Amoxicillin-Pot Clavulanate] Swelling    tongue  . Biaxin [Clarithromycin] Swelling and Rash    tongue    Current Outpatient Prescriptions  Medication Sig Dispense Refill  . albuterol (PROVENTIL HFA;VENTOLIN HFA) 108 (90 BASE) MCG/ACT inhaler Inhale 1-2 puffs into the lungs every 6 (six) hours as needed for wheezing or shortness of breath. Dispense with aerochamber 1 Inhaler 0  . aspirin EC 81 MG tablet Take 81 mg by mouth daily.    . benazepril (LOTENSIN) 40 MG tablet Take 1 tablet (40 mg total) by mouth daily. 90 tablet 4  . Calcium-Vitamin D-Vitamin K (CALCIUM + D + K PO) Take by mouth.    Marland Kitchen LORazepam (ATIVAN)  1 MG tablet Take 0.5 tablets (0.5 mg total) by mouth 2 (two) times daily as needed for anxiety. 90 tablet 1  . Multiple Vitamins-Minerals (MULTIVITAMIN WITH MINERALS) tablet Take 1 tablet by mouth daily.    Marland Kitchen omeprazole (PRILOSEC) 20 MG capsule Take 1 capsule (20 mg total) by mouth daily as needed. 90 capsule 4  . PARoxetine (PAXIL) 30 MG tablet TAKE 1 TABLET BY MOUTH EVERY DAY 30 tablet 6  . Triamcinolone Acetonide (NASACORT AQ NA) Place into the nose daily.     No current facility-administered medications for this  visit.     OBJECTIVE: Vitals:   11/21/15 1005  BP: (!) 143/85  Pulse: 67  Temp: 97.4 F (36.3 C)     Body mass index is 26.14 kg/m.    ECOG FS:0 - Asymptomatic  General: Well-developed, well-nourished, no acute distress. Eyes: anicteric sclera. Lungs: Bilateral inspiratory wheeze, diminished throughout. Heart: Regular rate and rhythm. No rubs, murmurs, or gallops. Musculoskeletal: No edema, cyanosis, or clubbing. Neuro: Alert, answering all questions appropriately. Cranial nerves grossly intact. Skin: No rashes or petechiae noted. Psych: Normal affect.   LAB RESULTS:  Lab Results  Component Value Date   NA 142 07/08/2015   K 4.2 07/08/2015   CL 103 07/08/2015   CO2 25 07/08/2015   GLUCOSE 106 (H) 07/08/2015   BUN 10 07/08/2015   CREATININE 0.81 07/08/2015   CALCIUM 9.7 07/08/2015   PROT 6.1 01/06/2015   ALBUMIN 4.6 01/06/2015   AST 18 01/06/2015   ALT 16 01/06/2015   ALKPHOS 93 01/06/2015   BILITOT 0.7 01/06/2015   GFRNONAA 72 07/08/2015   GFRAA 83 07/08/2015    Lab Results  Component Value Date   WBC 13.9 (H) 11/21/2015   NEUTROABS 2.8 11/21/2015   HGB 15.7 11/21/2015   HCT 45.9 11/21/2015   MCV 96.4 11/21/2015   PLT 148 (L) 11/21/2015     STUDIES: No results found.  ASSESSMENT: CLL, Rai stage 0  PLAN:    1. CLL: Patient's white blood cell count continues to decline and is now only mildly elevated at 13.9. She has no other evidence of endorgan damage. Patient completed 4 cycles of weekly Rituxan treatments in May 2017. No further intervention is needed at this time. Return to clinic in 3 months for laboratory work and further evaluation. 2. Thrombocytopenia: Mild, monitor. 3. Weakness/fatigue: Unlikely related to her underlying CLL or treatment. 4. Tobacco abuse: CT screening previously discussed and patient plans to get a referral from her primary care physician.   Patient expressed understanding and was in agreement with this plan. She also  understands that She can call clinic at any time with any questions, concerns, or complaints.    Lloyd Huger, MD   11/21/2015 12:38 PM

## 2015-11-21 ENCOUNTER — Inpatient Hospital Stay: Payer: Medicare HMO | Attending: Oncology

## 2015-11-21 ENCOUNTER — Inpatient Hospital Stay (HOSPITAL_BASED_OUTPATIENT_CLINIC_OR_DEPARTMENT_OTHER): Payer: Medicare HMO | Admitting: Oncology

## 2015-11-21 VITALS — BP 143/85 | HR 67 | Temp 97.4°F | Wt 166.9 lb

## 2015-11-21 DIAGNOSIS — D696 Thrombocytopenia, unspecified: Secondary | ICD-10-CM

## 2015-11-21 DIAGNOSIS — R531 Weakness: Secondary | ICD-10-CM | POA: Diagnosis not present

## 2015-11-21 DIAGNOSIS — C9110 Chronic lymphocytic leukemia of B-cell type not having achieved remission: Secondary | ICD-10-CM

## 2015-11-21 DIAGNOSIS — Z79899 Other long term (current) drug therapy: Secondary | ICD-10-CM | POA: Insufficient documentation

## 2015-11-21 DIAGNOSIS — Z803 Family history of malignant neoplasm of breast: Secondary | ICD-10-CM | POA: Diagnosis not present

## 2015-11-21 DIAGNOSIS — F17210 Nicotine dependence, cigarettes, uncomplicated: Secondary | ICD-10-CM | POA: Diagnosis not present

## 2015-11-21 DIAGNOSIS — M8580 Other specified disorders of bone density and structure, unspecified site: Secondary | ICD-10-CM | POA: Diagnosis not present

## 2015-11-21 DIAGNOSIS — F418 Other specified anxiety disorders: Secondary | ICD-10-CM

## 2015-11-21 DIAGNOSIS — R5381 Other malaise: Secondary | ICD-10-CM

## 2015-11-21 DIAGNOSIS — Z880 Allergy status to penicillin: Secondary | ICD-10-CM | POA: Insufficient documentation

## 2015-11-21 DIAGNOSIS — Z7982 Long term (current) use of aspirin: Secondary | ICD-10-CM

## 2015-11-21 DIAGNOSIS — K219 Gastro-esophageal reflux disease without esophagitis: Secondary | ICD-10-CM

## 2015-11-21 DIAGNOSIS — Z23 Encounter for immunization: Secondary | ICD-10-CM | POA: Diagnosis not present

## 2015-11-21 DIAGNOSIS — G4700 Insomnia, unspecified: Secondary | ICD-10-CM | POA: Insufficient documentation

## 2015-11-21 DIAGNOSIS — R69 Illness, unspecified: Secondary | ICD-10-CM | POA: Diagnosis not present

## 2015-11-21 DIAGNOSIS — R5383 Other fatigue: Secondary | ICD-10-CM | POA: Insufficient documentation

## 2015-11-21 LAB — CBC WITH DIFFERENTIAL/PLATELET
Basophils Absolute: 0.1 10*3/uL (ref 0–0.1)
Basophils Relative: 1 %
Eosinophils Absolute: 0.2 10*3/uL (ref 0–0.7)
Eosinophils Relative: 1 %
HCT: 45.9 % (ref 35.0–47.0)
Hemoglobin: 15.7 g/dL (ref 12.0–16.0)
Lymphocytes Relative: 74 %
Lymphs Abs: 10.4 10*3/uL — ABNORMAL HIGH (ref 1.0–3.6)
MCH: 33 pg (ref 26.0–34.0)
MCHC: 34.3 g/dL (ref 32.0–36.0)
MCV: 96.4 fL (ref 80.0–100.0)
Monocytes Absolute: 0.5 10*3/uL (ref 0.2–0.9)
Monocytes Relative: 4 %
Neutro Abs: 2.8 10*3/uL (ref 1.4–6.5)
Neutrophils Relative %: 20 %
Platelets: 148 10*3/uL — ABNORMAL LOW (ref 150–440)
RBC: 4.76 MIL/uL (ref 3.80–5.20)
RDW: 12.7 % (ref 11.5–14.5)
WBC: 13.9 10*3/uL — ABNORMAL HIGH (ref 3.6–11.0)

## 2015-11-21 MED ORDER — INFLUENZA VAC SPLIT QUAD 0.5 ML IM SUSY
0.5000 mL | PREFILLED_SYRINGE | Freq: Once | INTRAMUSCULAR | Status: AC
  Administered 2015-11-21: 0.5 mL via INTRAMUSCULAR
  Filled 2015-11-21: qty 0.5

## 2015-11-21 NOTE — Progress Notes (Signed)
Patient ambulates without assistance, brought to exam room 4.  Patient denies pain or discomfort at this time.  Vitals documented

## 2015-12-08 ENCOUNTER — Encounter: Payer: Self-pay | Admitting: Family Medicine

## 2015-12-08 ENCOUNTER — Ambulatory Visit (INDEPENDENT_AMBULATORY_CARE_PROVIDER_SITE_OTHER): Payer: Medicare HMO | Admitting: Family Medicine

## 2015-12-08 VITALS — BP 113/70 | HR 78 | Temp 98.1°F | Ht 66.2 in | Wt 164.6 lb

## 2015-12-08 DIAGNOSIS — R197 Diarrhea, unspecified: Secondary | ICD-10-CM | POA: Diagnosis not present

## 2015-12-08 LAB — CBC WITH DIFFERENTIAL/PLATELET
Hematocrit: 46.9 % — ABNORMAL HIGH (ref 34.0–46.6)
Hemoglobin: 15.9 g/dL (ref 11.1–15.9)
Lymphocytes Absolute: 6.4 10*3/uL — ABNORMAL HIGH (ref 0.7–3.1)
Lymphs: 64 %
MCH: 34.1 pg — ABNORMAL HIGH (ref 26.6–33.0)
MCHC: 33.9 g/dL (ref 31.5–35.7)
MCV: 101 fL — ABNORMAL HIGH (ref 79–97)
MID (Absolute): 0.4 10*3/uL (ref 0.1–1.6)
MID: 4 %
Neutrophils Absolute: 3.2 10*3/uL (ref 1.4–7.0)
Neutrophils: 32 %
Platelets: 154 10*3/uL (ref 150–379)
RBC: 4.66 x10E6/uL (ref 3.77–5.28)
RDW: 13.2 % (ref 12.3–15.4)
WBC: 10 10*3/uL (ref 3.4–10.8)

## 2015-12-08 NOTE — Patient Instructions (Signed)
Follow up as needed

## 2015-12-08 NOTE — Progress Notes (Signed)
BP 113/70 (BP Location: Left Arm, Patient Position: Sitting, Cuff Size: Normal)   Pulse 78   Temp 98.1 F (36.7 C)   Ht 5' 6.2" (1.681 m)   Wt 164 lb 9.6 oz (74.7 kg)   SpO2 94%   BMI 26.41 kg/m    Subjective:    Patient ID: Katherine [name], female    DOB: 1940-05-16, 75 y.o.   MRN: [id number]  HPI: Katherine [name] is a 75 y.o. female  Chief Complaint  Patient presents with  . Diarrhea    pt states she has had diarrhea for 11 days, states she has tried anti-diarrheals, high fiber food, gatorade, and pepto bismol.   Patient presents with 11 day history of persistent diarrhea and fatigue. Having diarrhea almost 12 times some days. No mucus noted in stools, but some blood related to her hemorrhoids becoming irritated the past week. Has been trying gatorade, pepto bismol, and anti diarrhea medication with minimal relief. No recent travel, no foods, or new medications. Denies fever, chills, abdominal pain, N/V, weight loss, night sweats.  Hx of diverticulosis. Recent colonoscopy benign. Patient being closely followed through the cancer center following completion of chemotherapy in May for leukemia.  Relevant past medical, surgical, family and social history reviewed and updated as indicated. Interim medical history since our last visit reviewed. Allergies and medications reviewed and updated.  Review of Systems  Constitutional: Positive for fatigue.  HENT: Negative.   Respiratory: Negative.   Cardiovascular: Negative.   Gastrointestinal: Positive for diarrhea.  Genitourinary: Negative.   Musculoskeletal: Negative.   Skin: Negative.   Neurological: Negative.   Psychiatric/Behavioral: Negative.     Per HPI unless specifically indicated above     Objective:    BP 113/70 (BP Location: Left Arm, Patient Position: Sitting, Cuff Size: Normal)   Pulse 78   Temp 98.1 F (36.7 C)   Ht 5' 6.2" (1.681 m)   Wt 164 lb 9.6 oz (74.7 kg)   SpO2 94%   BMI 26.41 kg/m   Wt  Readings from Last 3 Encounters:  12/08/15 164 lb 9.6 oz (74.7 kg)  11/21/15 166 lb 14.2 oz (75.7 kg)  09/17/15 165 lb (74.8 kg)    Physical Exam  Constitutional: She is oriented to person, place, and time. She appears well-developed and well-nourished. No distress.  HENT:  Head: Atraumatic.  Eyes: Conjunctivae are normal. Pupils are equal, round, and reactive to light. No scleral icterus.  Neck: Normal range of motion. Neck supple.  Cardiovascular: Normal rate.   Pulmonary/Chest: Effort normal and breath sounds normal.  Abdominal: Soft. Bowel sounds are normal. She exhibits no distension. There is tenderness (mild TTP diffusely in lower abd).  Musculoskeletal: Normal range of motion.  Lymphadenopathy:    She has no cervical adenopathy.  Neurological: She is alert and oriented to person, place, and time.  Skin: Skin is warm and dry.  Psychiatric: She has a normal mood and affect. Her behavior is normal.  Nursing note and vitals reviewed.   Results for orders placed or performed in visit on 11/21/15  CBC with Differential/Platelet  Result Value Ref Range   WBC 13.9 (H) 3.6 - 11.0 K/uL   RBC 4.76 3.80 - 5.20 MIL/uL   Hemoglobin 15.7 12.0 - 16.0 g/dL   HCT 45.9 35.0 - 47.0 %   MCV 96.4 80.0 - 100.0 fL   MCH 33.0 26.0 - 34.0 pg   MCHC 34.3 32.0 - 36.0 g/dL   RDW 12.7 11.5 -  14.5 %   Platelets 148 (L) 150 - 440 K/uL   Neutrophils Relative % 20 %   Neutro Abs 2.8 1.4 - 6.5 K/uL   Lymphocytes Relative 74 %   Lymphs Abs 10.4 (H) 1.0 - 3.6 K/uL   Monocytes Relative 4 %   Monocytes Absolute 0.5 0.2 - 0.9 K/uL   Eosinophils Relative 1 %   Eosinophils Absolute 0.2 0 - 0.7 K/uL   Basophils Relative 1 %   Basophils Absolute 0.1 0 - 0.1 K/uL      Assessment & Plan:   Problem List Items Addressed This Visit    None    Visit Diagnoses    Diarrhea, unspecified type    -  Primary   Await CMP and stool testing. WBC count reassuring. Strict return precautions given, BRAT diet,  imodium, push pedialyte/gatorade and water.    Relevant Orders   CBC With Differential/Platelet   Comprehensive metabolic panel   Cdiff NAA+O+P+Stool Culture       Follow up plan: Return if symptoms worsen or fail to improve.

## 2015-12-09 ENCOUNTER — Telehealth: Payer: Self-pay | Admitting: Family Medicine

## 2015-12-09 LAB — COMPREHENSIVE METABOLIC PANEL
ALT: 8 IU/L (ref 0–32)
AST: 11 IU/L (ref 0–40)
Albumin/Globulin Ratio: 2.9 — ABNORMAL HIGH (ref 1.2–2.2)
Albumin: 4.3 g/dL (ref 3.5–4.8)
Alkaline Phosphatase: 80 IU/L (ref 39–117)
BUN/Creatinine Ratio: 12 (ref 12–28)
BUN: 10 mg/dL (ref 8–27)
Bilirubin Total: 0.7 mg/dL (ref 0.0–1.2)
CO2: 24 mmol/L (ref 18–29)
Calcium: 9.3 mg/dL (ref 8.7–10.3)
Chloride: 102 mmol/L (ref 96–106)
Creatinine, Ser: 0.83 mg/dL (ref 0.57–1.00)
GFR calc Af Amer: 80 mL/min/{1.73_m2} (ref >59)
GFR calc non Af Amer: 70 mL/min/{1.73_m2} (ref >59)
Globulin, Total: 1.5 g/dL (ref 1.5–4.5)
Glucose: 106 mg/dL — ABNORMAL HIGH (ref 65–99)
Potassium: 4.1 mmol/L (ref 3.5–5.2)
Sodium: 143 mmol/L (ref 134–144)
Total Protein: 5.8 g/dL — ABNORMAL LOW (ref 6.0–8.5)

## 2015-12-09 NOTE — Telephone Encounter (Signed)
Please call pt and let her know that her electrolyte panel came back fine. Still waiting on the stool cultures to come back.

## 2015-12-10 ENCOUNTER — Other Ambulatory Visit: Payer: Self-pay | Admitting: Family Medicine

## 2015-12-10 DIAGNOSIS — Z1231 Encounter for screening mammogram for malignant neoplasm of breast: Secondary | ICD-10-CM

## 2015-12-11 MED ORDER — CIPROFLOXACIN HCL 500 MG PO TABS
500.0000 mg | ORAL_TABLET | Freq: Two times a day (BID) | ORAL | 0 refills | Status: DC
Start: 2015-12-11 — End: 2016-01-07

## 2015-12-11 NOTE — Telephone Encounter (Signed)
Called pt and gave her, her lab results. Pt doesn't want to see a specialist at this time and would like to know if there is anything stronger than amonium that she could take. She also request that when we call back we call her on her cell phone.

## 2015-12-11 NOTE — Telephone Encounter (Signed)
Called pt back to let her know that her antibiotic had been called in and that rachel suggest she take a probiotic if she isn't already and that if she gets weak she should go to the ER for fluids. Pt verbalized understanding.

## 2015-12-11 NOTE — Telephone Encounter (Signed)
Please call patient and let her know that so far everything is negative with her stool cultures. I am more than happy to send her to GI if she is no better.

## 2015-12-11 NOTE — Telephone Encounter (Signed)
I've sent in an antibiotic to see if that helps her. Also, if she is not already on a strong probiotic I highly recommend starting on one in addition to the imodium. If she is dizzy or too weak from fluid loss, she needs to go to the ER for fluids

## 2015-12-11 NOTE — Telephone Encounter (Signed)
Pt would like to know if her stool cultures had come back because she still isn't feeling good.

## 2015-12-13 LAB — CDIFF NAA+O+P+STOOL CULTURE: E coli, Shiga toxin Assay: NEGATIVE

## 2015-12-19 ENCOUNTER — Telehealth: Payer: Self-pay | Admitting: Family Medicine

## 2015-12-19 NOTE — Telephone Encounter (Signed)
Patient notified

## 2015-12-19 NOTE — Telephone Encounter (Signed)
Routing to provider  

## 2015-12-19 NOTE — Telephone Encounter (Signed)
As Apolonio Schneiders told her on the 17th, everything looks normal.

## 2015-12-19 NOTE — Telephone Encounter (Signed)
Pt called stated she would like the results from her stool sample cards. Please call pt with results. Thanks.

## 2016-01-07 ENCOUNTER — Ambulatory Visit
Admission: RE | Admit: 2016-01-07 | Discharge: 2016-01-07 | Disposition: A | Discharge status: Home / Self Care | Payer: Medicare HMO | Source: Ambulatory Visit | Attending: Family Medicine | Admitting: Family Medicine

## 2016-01-07 ENCOUNTER — Encounter: Payer: Self-pay | Admitting: Family Medicine

## 2016-01-07 ENCOUNTER — Ambulatory Visit (INDEPENDENT_AMBULATORY_CARE_PROVIDER_SITE_OTHER): Payer: Medicare HMO | Admitting: Family Medicine

## 2016-01-07 VITALS — BP 127/76 | HR 91 | Temp 98.1°F | Ht 67.0 in | Wt 161.0 lb

## 2016-01-07 DIAGNOSIS — R05 Cough: Secondary | ICD-10-CM | POA: Diagnosis not present

## 2016-01-07 DIAGNOSIS — I10 Essential (primary) hypertension: Secondary | ICD-10-CM

## 2016-01-07 DIAGNOSIS — R69 Illness, unspecified: Secondary | ICD-10-CM | POA: Diagnosis not present

## 2016-01-07 DIAGNOSIS — D692 Other nonthrombocytopenic purpura: Secondary | ICD-10-CM

## 2016-01-07 DIAGNOSIS — C9110 Chronic lymphocytic leukemia of B-cell type not having achieved remission: Secondary | ICD-10-CM

## 2016-01-07 DIAGNOSIS — F3342 Major depressive disorder, recurrent, in full remission: Secondary | ICD-10-CM | POA: Diagnosis not present

## 2016-01-07 DIAGNOSIS — M79621 Pain in right upper arm: Secondary | ICD-10-CM | POA: Diagnosis not present

## 2016-01-07 DIAGNOSIS — Z0000 Encounter for general adult medical examination without abnormal findings: Secondary | ICD-10-CM

## 2016-01-07 DIAGNOSIS — I700 Atherosclerosis of aorta: Secondary | ICD-10-CM | POA: Insufficient documentation

## 2016-01-07 LAB — URINALYSIS, ROUTINE W REFLEX MICROSCOPIC
Bilirubin, UA: NEGATIVE
Glucose, UA: NEGATIVE
Ketones, UA: NEGATIVE
Leukocytes, UA: NEGATIVE
Nitrite, UA: NEGATIVE
Protein, UA: NEGATIVE
Specific Gravity, UA: 1.01 (ref 1.005–1.030)
Urobilinogen, Ur: 0.2 mg/dL (ref 0.2–1.0)
pH, UA: 5.5 (ref 5.0–7.5)

## 2016-01-07 LAB — MICROSCOPIC EXAMINATION

## 2016-01-07 MED ORDER — PAROXETINE HCL 30 MG PO TABS: 30.0000 mg | ORAL_TABLET | Freq: Every day | ORAL | 4 refills | Status: DC

## 2016-01-07 MED ORDER — BENAZEPRIL HCL 40 MG PO TABS: 40.0000 mg | ORAL_TABLET | Freq: Every day | ORAL | 4 refills | Status: DC

## 2016-01-07 MED ORDER — OMEPRAZOLE 20 MG PO CPDR: 20.0000 mg | DELAYED_RELEASE_CAPSULE | Freq: Every day | ORAL | 4 refills | Status: DC | PRN

## 2016-01-07 MED ORDER — LORAZEPAM 1 MG PO TABS: 0.5000 mg | ORAL_TABLET | Freq: Two times a day (BID) | ORAL | 1 refills | Status: DC | PRN

## 2016-01-07 NOTE — Assessment & Plan Note (Signed)
The current medical regimen is effective;  continue present plan and medications.  

## 2016-01-07 NOTE — Progress Notes (Signed)
BP 127/76   Pulse 91   Temp 98.1 F (36.7 C)   Ht 5' 7"  (1.702 m)   Wt 161 lb (73 kg)   SpO2 95%   BMI 25.22 kg/m    Subjective:    Patient ID: Katherine [name], female    DOB: 21-Jul-1940, 75 y.o.   MRN: [id number]  HPI: Katherine [name] is a 75 y.o. female  Chief Complaint  Patient presents with  . Annual Exam    She has mammo appt scheduled for the end of the month.  . Labs    She recently had a CBC and CMP checked thru another office. Did not add those to her CPE labs.  AWV metrics met.   diarrhea slowly abating but still having 3 bowel movements yesterday morning and then none the rest of the day patient no bowel movements today and hasn't eaten. No blood in stool or urine. Of course hemorrhoids flared up during this time and having some issues. Doesn't feel as sick as she did previously.  Patient with right axilla hurting his been ongoing for several months on getting worse happens this will happen several times a day seems to come on with some type of movement and gets better with movement also on and then it goes away. No noted masses or change in this area  Takes half a lorazepam morning and evening for anxiety control. This seems to be doing well uses remained stable and continues paroxetine without problems hematology is following CLL.  Relevant past medical, surgical, family and social history reviewed and updated as indicated. Interim medical history since our last visit reviewed. Allergies and medications reviewed and updated.  Other than noted above Review of Systems  Constitutional: Negative.   HENT: Negative.   Eyes: Negative.   Respiratory: Negative.   Cardiovascular: Negative.   Gastrointestinal: Negative.   Endocrine: Negative.   Genitourinary: Negative.   Musculoskeletal: Negative.   Skin: Negative.   Allergic/Immunologic: Negative.   Neurological: Negative.   Hematological: Negative.   Psychiatric/Behavioral: Negative.     Per HPI unless  specifically indicated above     Objective:    BP 127/76   Pulse 91   Temp 98.1 F (36.7 C)   Ht 5' 7"  (1.702 m)   Wt 161 lb (73 kg)   SpO2 95%   BMI 25.22 kg/m   Wt Readings from Last 3 Encounters:  01/07/16 161 lb (73 kg)  12/08/15 164 lb 9.6 oz (74.7 kg)  11/21/15 166 lb 14.2 oz (75.7 kg)    Physical Exam  Constitutional: She is oriented to person, place, and time. She appears well-developed and well-nourished.  HENT:  Head: Normocephalic and atraumatic.  Right Ear: External ear normal.  Left Ear: External ear normal.  Nose: Nose normal.  Mouth/Throat: Oropharynx is clear and moist.  Eyes: Conjunctivae and EOM are normal. Pupils are equal, round, and reactive to light.  Neck: Normal range of motion. Neck supple. Carotid bruit is not present.  Cardiovascular: Normal rate, regular rhythm and normal heart sounds.   No murmur heard. Pulmonary/Chest: Effort normal and breath sounds normal. She exhibits no mass. Right breast exhibits no mass, no skin change and no tenderness. Left breast exhibits no mass, no skin change and no tenderness. Breasts are symmetrical.  Abdominal: Soft. Bowel sounds are normal. There is no hepatosplenomegaly.  Musculoskeletal: Normal range of motion.  Right axilla normal with no masses or lumps  Neurological: She is alert and oriented to  person, place, and time.  Skin: No rash noted.  Psychiatric: She has a normal mood and affect. Her behavior is normal. Judgment and thought content normal.    Results for orders placed or performed in visit on 12/08/15  Cdiff NAA+O+P+Stool Culture  Result Value Ref Range   Salmonella/Shigella Screen Final report    Stool Culture result 1 (RSASHR) Comment    Campylobacter Culture Final report    Stool Culture result 1 (CMPCXR) Comment    E coli, Shiga toxin Assay Negative Negative   OVA + PARASITE EXAM Final report    O&P result 1 Comment    Toxigenic C Difficile by pcr CANCELED   CBC With  Differential/Platelet  Result Value Ref Range   WBC 10.0 3.4 - 10.8 x10E3/uL   RBC 4.66 3.77 - 5.28 x10E6/uL   Hemoglobin 15.9 11.1 - 15.9 g/dL   Hematocrit 46.9 (H) 34.0 - 46.6 %   MCV 101 (H) 79 - 97 fL   MCH 34.1 (H) 26.6 - 33.0 pg   MCHC 33.9 31.5 - 35.7 g/dL   RDW 13.2 12.3 - 15.4 %   Platelets 154 150 - 379 x10E3/uL   Neutrophils 32 Not Estab. %   Lymphs 64 Not Estab. %   MID 4 Not Estab. %   Neutrophils Absolute 3.2 1.4 - 7.0 x10E3/uL   Lymphocytes Absolute 6.4 (H) 0.7 - 3.1 x10E3/uL   MID (Absolute) 0.4 0.1 - 1.6 X10E3/uL  Comprehensive metabolic panel  Result Value Ref Range   Glucose 106 (H) 65 - 99 mg/dL   BUN 10 8 - 27 mg/dL   Creatinine, Ser 0.83 0.57 - 1.00 mg/dL   GFR calc non Af Amer 70 >59 mL/min/1.73   GFR calc Af Amer 80 >59 mL/min/1.73   BUN/Creatinine Ratio 12 12 - 28   Sodium 143 134 - 144 mmol/L   Potassium 4.1 3.5 - 5.2 mmol/L   Chloride 102 96 - 106 mmol/L   CO2 24 18 - 29 mmol/L   Calcium 9.3 8.7 - 10.3 mg/dL   Total Protein 5.8 (L) 6.0 - 8.5 g/dL   Albumin 4.3 3.5 - 4.8 g/dL   Globulin, Total 1.5 1.5 - 4.5 g/dL   Albumin/Globulin Ratio 2.9 (H) 1.2 - 2.2   Bilirubin Total 0.7 0.0 - 1.2 mg/dL   Alkaline Phosphatase 80 39 - 117 IU/L   AST 11 0 - 40 IU/L   ALT 8 0 - 32 IU/L      Assessment & Plan:   Problem List Items Addressed This Visit      Cardiovascular and Mediastinum   Senile purpura ([place])    The current medical regimen is effective;  continue present plan and medications.       Relevant Medications   benazepril (LOTENSIN) 40 MG tablet   Essential hypertension - Primary    The current medical regimen is effective;  continue present plan and medications.       Relevant Medications   benazepril (LOTENSIN) 40 MG tablet   Other Relevant Orders   Lipid Profile   Urinalysis, Routine w reflex microscopic (not at [facility])   TSH     Other   Depression   Relevant Medications   LORazepam (ATIVAN) 1 MG tablet   PARoxetine (PAXIL) 30  MG tablet   CLL (chronic lymphocytic leukemia) (HCC)    Followed by hematology stable white count and lower than it has been in years      Relevant Medications   LORazepam (ATIVAN)  1 MG tablet    Other Visit Diagnoses    Chronic lymphocytic leukemia of B-cell type not having achieved remission (HCC)       Relevant Medications   LORazepam (ATIVAN) 1 MG tablet   Other Relevant Orders   DG Chest 2 View   Axillary pain, right       Relevant Orders   DG Chest 2 View     Maxillary muscle strain axilla along with rhonchi we will get chest x-ray For diarrhea discussed dietary restrictions slow refeeding A she'll continue current hemorrhoid care  Follow up plan: Return in about 6 months (around 07/06/2016) for BMP.

## 2016-01-07 NOTE — Assessment & Plan Note (Signed)
Followed by hematology stable white count and lower than it has been in years

## 2016-01-08 ENCOUNTER — Telehealth: Payer: Self-pay | Admitting: Family Medicine

## 2016-01-08 DIAGNOSIS — E7800 Pure hypercholesterolemia, unspecified: Secondary | ICD-10-CM | POA: Insufficient documentation

## 2016-01-08 LAB — LIPID PANEL
Chol/HDL Ratio: 5.9 ratio units — ABNORMAL HIGH (ref 0.0–4.4)
Cholesterol, Total: 229 mg/dL — ABNORMAL HIGH (ref 100–199)
HDL: 39 mg/dL — ABNORMAL LOW (ref >39)
LDL Calculated: 148 mg/dL — ABNORMAL HIGH (ref 0–99)
Triglycerides: 208 mg/dL — ABNORMAL HIGH (ref 0–149)
VLDL Cholesterol Cal: 42 mg/dL — ABNORMAL HIGH (ref 5–40)

## 2016-01-08 LAB — TSH: TSH: 3.32 u[IU]/mL (ref 0.450–4.500)

## 2016-01-08 MED ORDER — ATORVASTATIN CALCIUM 10 MG PO TABS: 10.0000 mg | ORAL_TABLET | Freq: Every day | ORAL | 2 refills | Status: DC

## 2016-01-08 MED ORDER — MELOXICAM 15 MG PO TABS
15.0000 mg | ORAL_TABLET | Freq: Every day | ORAL | 3 refills | Status: DC
Start: 2016-01-08 — End: 2016-03-05

## 2016-01-08 NOTE — Telephone Encounter (Signed)
Phone call Discussed with patient cholesterol elevated. X-ray showing atherosclerosis of her aorta start cholesterol medicine recheck in 2 months with lipid panel, ALT AST.  Patient's axillary discomfort still ongoing will give meloxicam to see if that doesn't help.

## 2016-01-08 NOTE — Telephone Encounter (Signed)
-----   Message from Moss Mc, [place] sent at 01/08/2016 11:20 AM EST ----- Labs.

## 2016-01-22 ENCOUNTER — Ambulatory Visit
Admission: RE | Admit: 2016-01-22 | Discharge: 2016-01-22 | Disposition: A | Discharge status: Home / Self Care | Payer: Medicare HMO | Source: Ambulatory Visit | Attending: Family Medicine | Admitting: Family Medicine

## 2016-01-22 DIAGNOSIS — Z1231 Encounter for screening mammogram for malignant neoplasm of breast: Secondary | ICD-10-CM

## 2016-01-22 HISTORY — DX: Personal history of antineoplastic chemotherapy: Z92.21

## 2016-02-24 ENCOUNTER — Telehealth: Payer: Self-pay | Admitting: Family Medicine

## 2016-02-24 NOTE — Telephone Encounter (Signed)
Pt has thick clear mucous and would like like to know if she could have something sent to [facility].

## 2016-02-24 NOTE — Telephone Encounter (Signed)
Patient notified, suggested that patient speak with pharmacy to see what the best decongestant is to use with her medications.

## 2016-02-24 NOTE — Telephone Encounter (Signed)
Routing to provider  

## 2016-02-24 NOTE — Telephone Encounter (Signed)
She can use OTC nasal saline and decongestants. If she is not getting better, she will need to be seen.

## 2016-02-26 NOTE — Progress Notes (Deleted)
[place]  [phone]) [phone] [phone]) [phone]  ID: Katherine [name] OB: 11-11-1940  MR#: [id number]  [id number]  Patient Care Team: [name], MD as PCP - General (Family Medicine) [name], MD as PCP - Family Medicine (Family Medicine) Forest Gleason, MD (Oncology) Oneta Rack, MD (Dermatology) Manya Silvas, MD (Gastroenterology) Robert Bellow, MD (General Surgery)  CHIEF COMPLAINT: CLL  INTERVAL HISTORY: Patient returns to clinic today for repeat laboratory work and further evaluation. She continues to have persistent weakness and fatigue, but otherwise she feels well.  She denies any recent fevers or illnesses. She has a fair appetite and denies weight loss. She denies night sweats. She has no chest pain or shortness of breath. She denies any nausea, vomiting, constipation, or diarrhea. She has no urinary complaints. Patient offers no further specific complaints today.  REVIEW OF SYSTEMS:   Review of Systems  Constitutional: Positive for malaise/fatigue. Negative for fever and weight loss.  Respiratory: Negative.  Negative for cough and shortness of breath.   Cardiovascular: Negative.  Negative for chest pain.  Gastrointestinal: Negative.   Genitourinary: Negative.   Musculoskeletal: Negative.   Neurological: Positive for weakness. Negative for sensory change.  Psychiatric/Behavioral: The patient has insomnia. The patient is not nervous/anxious.     As per HPI. Otherwise, a complete review of systems is negative.  PAST MEDICAL HISTORY: Past Medical History:  Diagnosis Date  . Allergy   . Anxiety   . Depression   . GERD (gastroesophageal reflux disease)   . Hemorrhoids   . 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 Lobar pneumonia ([place])   . Osteopenia   . Personal history of chemotherapy     PAST SURGICAL HISTORY: Past Surgical History:  Procedure Laterality Date  . ABDOMINAL HYSTERECTOMY    . APPENDECTOMY    . BREAST  BIOPSY Left    bx x 3-neg  . COLON SURGERY     sigmoid resection  . COLONOSCOPY     2007, 2012  . COLONOSCOPY WITH PROPOFOL N/A 09/17/2015   Procedure: COLONOSCOPY WITH PROPOFOL;  Surgeon: Robert Bellow, MD;  Location: [facility] ENDOSCOPY;  Service: Endoscopy;  Laterality: N/A;  . SPINE SURGERY     L4-5    FAMILY HISTORY Family History  Problem Relation Age of Onset  . Osteoporosis Mother   . Hypertension Father   . Heart attack Father   . Stroke Maternal Grandfather   . Breast cancer Sister 32       ADVANCED DIRECTIVES:    HEALTH MAINTENANCE: Social History  Substance Use Topics  . Smoking status: Current Every Day Smoker    Packs/day: 1.00    Years: 35.00    Types: Cigarettes  . Smokeless tobacco: Never Used  . Alcohol use No     Allergies  Allergen Reactions  . Augmentin [Amoxicillin-Pot Clavulanate] Swelling    tongue  . Biaxin [Clarithromycin] Swelling and Rash    tongue    Current Outpatient Prescriptions  Medication Sig Dispense Refill  . albuterol (PROVENTIL HFA;VENTOLIN HFA) 108 (90 BASE) MCG/ACT inhaler Inhale 1-2 puffs into the lungs every 6 (six) hours as needed for wheezing or shortness of breath. Dispense with aerochamber 1 Inhaler 0  . aspirin EC 81 MG tablet Take 81 mg by mouth daily.    Marland Kitchen atorvastatin (LIPITOR) 10 MG tablet Take 1 tablet (10 mg total) by mouth daily. 30 tablet 2  . benazepril (LOTENSIN) 40 MG tablet Take 1 tablet (40 mg total)  by mouth daily. 90 tablet 4  . Calcium-Vitamin D-Vitamin K (CALCIUM + D + K PO) Take by mouth.    Marland Kitchen LORazepam (ATIVAN) 1 MG tablet Take 0.5 tablets (0.5 mg total) by mouth 2 (two) times daily as needed for anxiety. 90 tablet 1  . meloxicam (MOBIC) 15 MG tablet Take 1 tablet (15 mg total) by mouth daily. 30 tablet 3  . Multiple Vitamins-Minerals (MULTIVITAMIN WITH MINERALS) tablet Take 1 tablet by mouth daily.    Marland Kitchen omeprazole (PRILOSEC) 20 MG capsule Take 1 capsule (20 mg total) by mouth daily as needed. 90  capsule 4  . PARoxetine (PAXIL) 30 MG tablet Take 1 tablet (30 mg total) by mouth daily. 90 tablet 4  . Triamcinolone Acetonide (NASACORT AQ NA) Place into the nose daily.     No current facility-administered medications for this visit.     OBJECTIVE: There were no vitals filed for this visit.   There is no height or weight on file to calculate BMI.    ECOG FS:0 - Asymptomatic  General: Well-developed, well-nourished, no acute distress. Eyes: anicteric sclera. Lungs: Bilateral inspiratory wheeze, diminished throughout. Heart: Regular rate and rhythm. No rubs, murmurs, or gallops. Musculoskeletal: No edema, cyanosis, or clubbing. Neuro: Alert, answering all questions appropriately. Cranial nerves grossly intact. Skin: No rashes or petechiae noted. Psych: Normal affect.   LAB RESULTS:  Lab Results  Component Value Date   NA 143 12/08/2015   K 4.1 12/08/2015   CL 102 12/08/2015   CO2 24 12/08/2015   GLUCOSE 106 (H) 12/08/2015   BUN 10 12/08/2015   CREATININE 0.83 12/08/2015   CALCIUM 9.3 12/08/2015   PROT 5.8 (L) 12/08/2015   ALBUMIN 4.3 12/08/2015   AST 11 12/08/2015   ALT 8 12/08/2015   ALKPHOS 80 12/08/2015   BILITOT 0.7 12/08/2015   GFRNONAA 70 12/08/2015   GFRAA 80 12/08/2015    Lab Results  Component Value Date   WBC 10.0 12/08/2015   NEUTROABS 3.2 12/08/2015   HGB 15.7 11/21/2015   HCT 46.9 (H) 12/08/2015   MCV 101 (H) 12/08/2015   PLT 154 12/08/2015     STUDIES: No results found.  ASSESSMENT: CLL, Rai stage 0  PLAN:    1. CLL: Patient's white blood cell count continues to decline and is now only mildly elevated at 13.9. She has no other evidence of endorgan damage. Patient completed 4 cycles of weekly Rituxan treatments in May 2017. No further intervention is needed at this time. Return to clinic in 3 months for laboratory work and further evaluation. 2. Thrombocytopenia: Mild, monitor. 3. Weakness/fatigue: Unlikely related to her underlying CLL or  treatment. 4. Tobacco abuse: CT screening previously discussed and patient plans to get a referral from her primary care physician.   Patient expressed understanding and was in agreement with this plan. She also understands that She can call clinic at any time with any questions, concerns, or complaints.    Lloyd Huger, MD   02/26/2016 8:51 AM

## 2016-02-27 ENCOUNTER — Inpatient Hospital Stay: Payer: Medicare HMO | Admitting: Oncology

## 2016-02-27 ENCOUNTER — Inpatient Hospital Stay: Payer: Medicare HMO

## 2016-02-29 ENCOUNTER — Other Ambulatory Visit: Payer: Self-pay | Admitting: Family Medicine

## 2016-02-29 DIAGNOSIS — I10 Essential (primary) hypertension: Secondary | ICD-10-CM

## 2016-03-03 NOTE — Progress Notes (Signed)
[place]  [phone] [phone]) [phone]  ID: Katherine [name] OB: 04-28-1940  MR#: [id number]  [id number]  Patient Care Team: [name], MD as PCP - General (Family Medicine) [name], MD as PCP - Family Medicine (Family Medicine) Forest Gleason, MD (Oncology) Oneta Rack, MD (Dermatology) Manya Silvas, MD (Gastroenterology) Robert Bellow, MD (General Surgery)  CHIEF COMPLAINT: CLL  INTERVAL HISTORY: Patient returns to clinic today for repeat laboratory work and further evaluation. She reports that she has had a cold for the past 5 weeks, during which time she also had a stomach bug, which has now resolved. She complains of sinus pressure and headache pain, nasal congestion, runny nose, and thick clear nasal discharge. She denies any recent fevers.  She denies weakness or fatigue.  She has a fair appetite and denies weight loss. She reports occasional night sweats. She reports chronic swelling in both legs, unchanged. She has no chest pain or shortness of breath. She does report occasional tingling in her left fingers, not interfering with daily activities. She reports chronic diarrhea, related to diverticulosis. She denies any nausea, vomiting, or constipation. She has no urinary complaints. She reports sleeping well. Patient offers no further specific complaints today.  REVIEW OF SYSTEMS:   Review of Systems  Constitutional: Negative for fever, malaise/fatigue and weight loss.  HENT: Positive for congestion and sinus pain.   Respiratory: Negative.  Negative for cough and shortness of breath.   Cardiovascular: Positive for leg swelling. Negative for chest pain.  Gastrointestinal: Positive for diarrhea. Negative for blood in stool, constipation, nausea and vomiting.  Genitourinary: Negative.  Negative for frequency, hematuria and urgency.  Musculoskeletal: Negative.   Neurological: Positive for tingling and headaches.  Negative for weakness.  Psychiatric/Behavioral: The patient is not nervous/anxious and does not have insomnia.     As per HPI. Otherwise, a complete review of systems is negative.  PAST MEDICAL HISTORY: Past Medical History:  Diagnosis Date  . Allergy   . Anxiety   . Depression   . GERD (gastroesophageal reflux disease)   . Hemorrhoids   . 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 Lobar pneumonia ([place])   . Osteopenia   . Personal history of chemotherapy     PAST SURGICAL HISTORY: Past Surgical History:  Procedure Laterality Date  . ABDOMINAL HYSTERECTOMY    . APPENDECTOMY    . BREAST BIOPSY Left    bx x 3-neg  . COLON SURGERY     sigmoid resection  . COLONOSCOPY     2007, 2012  . COLONOSCOPY WITH PROPOFOL N/A 09/17/2015   Procedure: COLONOSCOPY WITH PROPOFOL;  Surgeon: Robert Bellow, MD;  Location: [facility] ENDOSCOPY;  Service: Endoscopy;  Laterality: N/A;  . SPINE SURGERY     L4-5    FAMILY HISTORY Family History  Problem Relation Age of Onset  . Osteoporosis Mother   . Hypertension Father   . Heart attack Father   . Stroke Maternal Grandfather   . Breast cancer Sister 37       ADVANCED DIRECTIVES:    HEALTH MAINTENANCE: Social History  Substance Use Topics  . Smoking status: Current Every Day Smoker    Packs/day: 1.00    Years: 35.00    Types: Cigarettes  . Smokeless tobacco: Never Used  . Alcohol use No     Allergies  Allergen Reactions  . Augmentin [Amoxicillin-Pot Clavulanate] Swelling    tongue  . Biaxin [Clarithromycin] Swelling and Rash  tongue    Current Outpatient Prescriptions  Medication Sig Dispense Refill  . albuterol (PROVENTIL HFA;VENTOLIN HFA) 108 (90 BASE) MCG/ACT inhaler Inhale 1-2 puffs into the lungs every 6 (six) hours as needed for wheezing or shortness of breath. Dispense with aerochamber 1 Inhaler 0  . aspirin EC 81 MG tablet Take 81 mg by mouth daily.    Marland Kitchen atorvastatin (LIPITOR) 10 MG tablet Take 1 tablet (10 mg total) by mouth  daily. 30 tablet 2  . benazepril (LOTENSIN) 40 MG tablet Take 1 tablet (40 mg total) by mouth daily. 90 tablet 4  . benazepril (LOTENSIN) 40 MG tablet TAKE 1 TABLET(40 MG) BY MOUTH DAILY 90 tablet 0  . Calcium-Vitamin D-Vitamin K (CALCIUM + D + K PO) Take by mouth.    Marland Kitchen LORazepam (ATIVAN) 1 MG tablet Take 0.5 tablets (0.5 mg total) by mouth 2 (two) times daily as needed for anxiety. 90 tablet 1  . Multiple Vitamins-Minerals (MULTIVITAMIN WITH MINERALS) tablet Take 1 tablet by mouth daily.    Marland Kitchen omeprazole (PRILOSEC) 20 MG capsule Take 1 capsule (20 mg total) by mouth daily as needed. 90 capsule 4  . PARoxetine (PAXIL) 30 MG tablet Take 1 tablet (30 mg total) by mouth daily. 90 tablet 4  . Triamcinolone Acetonide (NASACORT AQ NA) Place into the nose daily.     No current facility-administered medications for this visit.     OBJECTIVE: Vitals:   03/05/16 1126  BP: 135/76  Pulse: 86  Resp: 18  Temp: 97.3 F (36.3 C)     Body mass index is 25.21 kg/m.    ECOG FS:0 - Asymptomatic  General: Well-developed, well-nourished, no acute distress. Eyes: anicteric sclera. Lungs: Bilateral inspiratory wheeze, diminished throughout. Heart: Regular rate and rhythm. No rubs, murmurs, or gallops. Musculoskeletal: No edema, cyanosis, or clubbing. Neuro: Alert, answering all questions appropriately. Cranial nerves grossly intact. Skin: No rashes or petechiae noted. Psych: Normal affect.   LAB RESULTS:  Lab Results  Component Value Date   NA 143 12/08/2015   K 4.1 12/08/2015   CL 102 12/08/2015   CO2 24 12/08/2015   GLUCOSE 106 (H) 12/08/2015   BUN 10 12/08/2015   CREATININE 0.83 12/08/2015   CALCIUM 9.3 12/08/2015   PROT 5.8 (L) 12/08/2015   ALBUMIN 4.3 12/08/2015   AST 11 12/08/2015   ALT 8 12/08/2015   ALKPHOS 80 12/08/2015   BILITOT 0.7 12/08/2015   GFRNONAA 70 12/08/2015   GFRAA 80 12/08/2015    Lab Results  Component Value Date   WBC 19.0 (H) 03/05/2016   NEUTROABS 4.2  03/05/2016   HGB 15.2 03/05/2016   HCT 45.4 03/05/2016   MCV 96.8 03/05/2016   PLT 208 03/05/2016     STUDIES: No results found.  ASSESSMENT: CLL, Rai stage 0  PLAN:    1. CLL: Patient's white blood cell count is elevated to 19.0 today, up from 10.0 on 12/08/15. The current increase is possibly reactive secondary to current and recent sinus and stomach infections. She has no other evidence of endorgan damage. Patient completed 4 cycles of weekly Rituxan treatments in May 2017. No further intervention is needed at this time. Return to clinic in 3 months for laboratory work and in 6 months for lab work and further evaluation. 2. Thrombocytopenia: Currently resolved. Monitor. 3. Weakness/fatigue: Currently resolved. Unlikely related to her underlying CLL or treatment. 4. Tobacco abuse: CT screening previously discussed and patient plans to get a referral from her primary care physician. 5.  Cold symptoms: Continue drinking fluids and electrolyte replacements. Antibiotics are not necessary or indicated at this time. Follow-up with PCP if needed.   Patient expressed understanding and was in agreement with this plan. She also understands that She can call clinic at any time with any questions, concerns, or complaints.   Lucendia Herrlich, NP  03/05/16 12:39 PM   Patient was seen and evaluated independently and I agree with the assessment and plan as dictated above.  Lloyd Huger, MD 03/07/16 4:31 PM

## 2016-03-05 ENCOUNTER — Inpatient Hospital Stay: Payer: Medicare HMO | Attending: Oncology

## 2016-03-05 ENCOUNTER — Inpatient Hospital Stay (HOSPITAL_BASED_OUTPATIENT_CLINIC_OR_DEPARTMENT_OTHER): Payer: Medicare HMO | Admitting: Oncology

## 2016-03-05 VITALS — BP 135/76 | HR 86 | Temp 97.3°F | Resp 18 | Wt 160.9 lb

## 2016-03-05 DIAGNOSIS — K5790 Diverticulosis of intestine, part unspecified, without perforation or abscess without bleeding: Secondary | ICD-10-CM | POA: Insufficient documentation

## 2016-03-05 DIAGNOSIS — Z79899 Other long term (current) drug therapy: Secondary | ICD-10-CM

## 2016-03-05 DIAGNOSIS — R51 Headache: Secondary | ICD-10-CM | POA: Diagnosis not present

## 2016-03-05 DIAGNOSIS — Z803 Family history of malignant neoplasm of breast: Secondary | ICD-10-CM | POA: Insufficient documentation

## 2016-03-05 DIAGNOSIS — J00 Acute nasopharyngitis [common cold]: Secondary | ICD-10-CM

## 2016-03-05 DIAGNOSIS — Z9221 Personal history of antineoplastic chemotherapy: Secondary | ICD-10-CM

## 2016-03-05 DIAGNOSIS — C9110 Chronic lymphocytic leukemia of B-cell type not having achieved remission: Secondary | ICD-10-CM

## 2016-03-05 DIAGNOSIS — F17210 Nicotine dependence, cigarettes, uncomplicated: Secondary | ICD-10-CM | POA: Insufficient documentation

## 2016-03-05 DIAGNOSIS — M8580 Other specified disorders of bone density and structure, unspecified site: Secondary | ICD-10-CM

## 2016-03-05 DIAGNOSIS — R202 Paresthesia of skin: Secondary | ICD-10-CM

## 2016-03-05 DIAGNOSIS — K5289 Other specified noninfective gastroenteritis and colitis: Secondary | ICD-10-CM | POA: Insufficient documentation

## 2016-03-05 DIAGNOSIS — R600 Localized edema: Secondary | ICD-10-CM

## 2016-03-05 DIAGNOSIS — K219 Gastro-esophageal reflux disease without esophagitis: Secondary | ICD-10-CM

## 2016-03-05 DIAGNOSIS — F418 Other specified anxiety disorders: Secondary | ICD-10-CM

## 2016-03-05 DIAGNOSIS — R61 Generalized hyperhidrosis: Secondary | ICD-10-CM

## 2016-03-05 LAB — CBC WITH DIFFERENTIAL/PLATELET
Basophils Absolute: 0.1 10*3/uL (ref 0–0.1)
Basophils Relative: 1 %
Eosinophils Absolute: 0.2 10*3/uL (ref 0–0.7)
Eosinophils Relative: 1 %
HCT: 45.4 % (ref 35.0–47.0)
Hemoglobin: 15.2 g/dL (ref 12.0–16.0)
Lymphocytes Relative: 74 %
Lymphs Abs: 14 10*3/uL — ABNORMAL HIGH (ref 1.0–3.6)
MCH: 32.5 pg (ref 26.0–34.0)
MCHC: 33.6 g/dL (ref 32.0–36.0)
MCV: 96.8 fL (ref 80.0–100.0)
Monocytes Absolute: 0.5 10*3/uL (ref 0.2–0.9)
Monocytes Relative: 2 %
Neutro Abs: 4.2 10*3/uL (ref 1.4–6.5)
Neutrophils Relative %: 22 %
Platelets: 208 10*3/uL (ref 150–440)
RBC: 4.69 MIL/uL (ref 3.80–5.20)
RDW: 12.6 % (ref 11.5–14.5)
WBC: 19 10*3/uL — ABNORMAL HIGH (ref 3.6–11.0)

## 2016-03-05 NOTE — Progress Notes (Signed)
Having cold-like symptoms but no fever today.

## 2016-03-09 ENCOUNTER — Ambulatory Visit (INDEPENDENT_AMBULATORY_CARE_PROVIDER_SITE_OTHER): Payer: Medicare HMO | Admitting: Family Medicine

## 2016-03-09 VITALS — BP 122/76

## 2016-03-09 DIAGNOSIS — I10 Essential (primary) hypertension: Secondary | ICD-10-CM | POA: Diagnosis not present

## 2016-03-09 DIAGNOSIS — E7800 Pure hypercholesterolemia, unspecified: Secondary | ICD-10-CM

## 2016-03-09 DIAGNOSIS — C9110 Chronic lymphocytic leukemia of B-cell type not having achieved remission: Secondary | ICD-10-CM

## 2016-03-09 DIAGNOSIS — J019 Acute sinusitis, unspecified: Secondary | ICD-10-CM

## 2016-03-09 LAB — LP+ALT+AST PICCOLO, WAIVED
ALT (SGPT) Piccolo, Waived: 10 U/L (ref 10–47)
AST (SGOT) Piccolo, Waived: 33 U/L (ref 11–38)
Chol/HDL Ratio Piccolo,Waive: 2.7 mg/dL
Cholesterol Piccolo, Waived: 119 mg/dL (ref <200)
HDL Chol Piccolo, Waived: 43 mg/dL — ABNORMAL LOW (ref >59)
LDL Chol Calc Piccolo Waived: 21 mg/dL (ref <100)
Triglycerides Piccolo,Waived: 273 mg/dL — ABNORMAL HIGH (ref <150)
VLDL Chol Calc Piccolo,Waive: 55 mg/dL — ABNORMAL HIGH (ref <30)

## 2016-03-09 MED ORDER — LEVOFLOXACIN 750 MG PO TABS: 750.0000 mg | ORAL_TABLET | Freq: Every day | ORAL | 0 refills | Status: DC

## 2016-03-09 NOTE — Assessment & Plan Note (Signed)
The current medical regimen is effective;  continue present plan and medications.  

## 2016-03-09 NOTE — Progress Notes (Signed)
BP 122/76 (BP Location: Left Arm)    Subjective:    Patient ID: Katherine [name], female    DOB: 1940/03/01, 76 y.o.   MRN: [id number]  HPI: Katherine [name] is a 76 y.o. female  Chief Complaint  Patient presents with  . Follow-up  . Hyperlipidemia   Patient with a month or plus of sinus symptoms congestion drainage has been feeling bad CLL is stable but may be getting a little bit worse. Maybe contributed to chronic sinus symptoms and infection. Patient having ongoing head cold congestion drainage been using multiple over-the-counter medications and sinus rinse to no avail. No fever but systemically is been feeling bad achy and just not doing well with decreased energy. Also follow-up cholesterol medications doing well no complaints. Relevant past medical, surgical, family and social history reviewed and updated as indicated. Interim medical history since our last visit reviewed. Allergies and medications reviewed and updated.  Review of Systems  Constitutional: Positive for activity change, chills, diaphoresis and fatigue. Negative for fever.  HENT: Positive for congestion, postnasal drip, rhinorrhea, sinus pain, sinus pressure, sneezing and sore throat.   Respiratory: Positive for cough. Negative for wheezing.   Cardiovascular: Negative.     Per HPI unless specifically indicated above     Objective:    BP 122/76 (BP Location: Left Arm)   Wt Readings from Last 3 Encounters:  03/05/16 160 lb 15 oz (73 kg)  01/07/16 161 lb (73 kg)  12/08/15 164 lb 9.6 oz (74.7 kg)    Physical Exam  Constitutional: She is oriented to person, place, and time. She appears well-developed and well-nourished. No distress.  HENT:  Head: Normocephalic and atraumatic.  Right Ear: Hearing and external ear normal.  Left Ear: Hearing and external ear normal.  Nose: Nose normal.  Mouth/Throat: Oropharyngeal exudate present.  Eyes: Conjunctivae and lids are normal. Right eye exhibits no  discharge. Left eye exhibits no discharge. No scleral icterus.  Cardiovascular: Normal rate, regular rhythm and normal heart sounds.   Pulmonary/Chest: Effort normal and breath sounds normal. No respiratory distress.  Musculoskeletal: Normal range of motion.  Lymphadenopathy:    She has no cervical adenopathy.  Neurological: She is alert and oriented to person, place, and time.  Skin: Skin is intact. No rash noted.  Psychiatric: She has a normal mood and affect. Her speech is normal and behavior is normal. Judgment and thought content normal. Cognition and memory are normal.    Results for orders placed or performed in visit on 03/05/16  CBC with Differential/Platelet  Result Value Ref Range   WBC 19.0 (H) 3.6 - 11.0 K/uL   RBC 4.69 3.80 - 5.20 MIL/uL   Hemoglobin 15.2 12.0 - 16.0 g/dL   HCT 45.4 35.0 - 47.0 %   MCV 96.8 80.0 - 100.0 fL   MCH 32.5 26.0 - 34.0 pg   MCHC 33.6 32.0 - 36.0 g/dL   RDW 12.6 11.5 - 14.5 %   Platelets 208 150 - 440 K/uL   Neutrophils Relative % 22 %   Neutro Abs 4.2 1.4 - 6.5 K/uL   Lymphocytes Relative 74 %   Lymphs Abs 14.0 (H) 1.0 - 3.6 K/uL   Monocytes Relative 2 %   Monocytes Absolute 0.5 0.2 - 0.9 K/uL   Eosinophils Relative 1 %   Eosinophils Absolute 0.2 0 - 0.7 K/uL   Basophils Relative 1 %   Basophils Absolute 0.1 0 - 0.1 K/uL      Assessment & Plan:  Problem List Items Addressed This Visit      Cardiovascular and Mediastinum   Essential hypertension     Other   CLL (chronic lymphocytic leukemia) ([place])    Possibly exacerbation patient not getting well from sinusitis.      Relevant Medications   levofloxacin (LEVAQUIN) 750 MG tablet   Hypercholesterolemia - Primary    The current medical regimen is effective;  continue present plan and medications.        Other Visit Diagnoses    Acute sinusitis, recurrence not specified, unspecified location       Discussed sinusitis care and treatment will continue usual over-the-counter  meds. Discussed penicillin allergy and antibiotic use will do Levaquin with cautions   Relevant Medications   levofloxacin (LEVAQUIN) 750 MG tablet       Follow up plan: Return for As scheduled.

## 2016-03-09 NOTE — Assessment & Plan Note (Signed)
Possibly exacerbation patient not getting well from sinusitis.

## 2016-03-10 ENCOUNTER — Ambulatory Visit: Payer: Medicare HMO | Admitting: Family Medicine

## 2016-04-12 ENCOUNTER — Other Ambulatory Visit: Payer: Self-pay | Admitting: Family Medicine

## 2016-04-12 NOTE — Telephone Encounter (Signed)
Last OV: 03/09/16 Next OV: 07/13/16  Lab Results  Component Value Date   CHOL 119 03/09/2016   HDL 39 (L) 01/07/2016   LDLCALC 148 (H) 01/07/2016   TRIG 273 (H) 03/09/2016   CHOLHDL 5.9 (H) 01/07/2016   Lab Results  Component Value Date   CREATININE 0.83 12/08/2015   BUN 10 12/08/2015   NA 143 12/08/2015   K 4.1 12/08/2015   CL 102 12/08/2015   CO2 24 12/08/2015

## 2016-05-14 DIAGNOSIS — Z08 Encounter for follow-up examination after completed treatment for malignant neoplasm: Secondary | ICD-10-CM | POA: Diagnosis not present

## 2016-05-14 DIAGNOSIS — D1722 Benign lipomatous neoplasm of skin and subcutaneous tissue of left arm: Secondary | ICD-10-CM | POA: Diagnosis not present

## 2016-05-14 DIAGNOSIS — Z85828 Personal history of other malignant neoplasm of skin: Secondary | ICD-10-CM | POA: Diagnosis not present

## 2016-05-14 DIAGNOSIS — L821 Other seborrheic keratosis: Secondary | ICD-10-CM | POA: Diagnosis not present

## 2016-06-04 ENCOUNTER — Inpatient Hospital Stay: Payer: Medicare HMO | Attending: Oncology

## 2016-06-04 DIAGNOSIS — Z9221 Personal history of antineoplastic chemotherapy: Secondary | ICD-10-CM | POA: Insufficient documentation

## 2016-06-04 DIAGNOSIS — C9110 Chronic lymphocytic leukemia of B-cell type not having achieved remission: Secondary | ICD-10-CM | POA: Insufficient documentation

## 2016-06-04 LAB — CBC WITH DIFFERENTIAL/PLATELET
Basophils Absolute: 0.1 10*3/uL (ref 0–0.1)
Basophils Relative: 0 %
Eosinophils Absolute: 0.2 10*3/uL (ref 0–0.7)
Eosinophils Relative: 1 %
HCT: 46.3 % (ref 35.0–47.0)
Hemoglobin: 15.7 g/dL (ref 12.0–16.0)
Lymphocytes Relative: 83 %
Lymphs Abs: 26 10*3/uL — ABNORMAL HIGH (ref 1.0–3.6)
MCH: 32.9 pg (ref 26.0–34.0)
MCHC: 34 g/dL (ref 32.0–36.0)
MCV: 96.8 fL (ref 80.0–100.0)
Monocytes Absolute: 0.6 10*3/uL (ref 0.2–0.9)
Monocytes Relative: 2 %
Neutro Abs: 4.4 10*3/uL (ref 1.4–6.5)
Neutrophils Relative %: 14 %
Platelets: 159 10*3/uL (ref 150–440)
RBC: 4.78 MIL/uL (ref 3.80–5.20)
RDW: 13.5 % (ref 11.5–14.5)
WBC: 31.3 10*3/uL — ABNORMAL HIGH (ref 3.6–11.0)

## 2016-07-06 ENCOUNTER — Other Ambulatory Visit: Payer: Self-pay | Admitting: Family Medicine

## 2016-07-06 ENCOUNTER — Ambulatory Visit: Payer: Medicare HMO | Admitting: Family Medicine

## 2016-07-06 DIAGNOSIS — F3342 Major depressive disorder, recurrent, in full remission: Secondary | ICD-10-CM

## 2016-07-07 NOTE — Telephone Encounter (Signed)
Can you find out if she has enough to get to her appointment or if she needs it before she sees Dr. Jeananne Rama.

## 2016-07-07 NOTE — Telephone Encounter (Signed)
Spoke with patient. She stated that she does believe she has enough medication to last.

## 2016-07-09 DIAGNOSIS — H40003 Preglaucoma, unspecified, bilateral: Secondary | ICD-10-CM | POA: Diagnosis not present

## 2016-07-13 ENCOUNTER — Ambulatory Visit (INDEPENDENT_AMBULATORY_CARE_PROVIDER_SITE_OTHER): Payer: Medicare HMO | Admitting: Family Medicine

## 2016-07-13 ENCOUNTER — Encounter: Payer: Self-pay | Admitting: Family Medicine

## 2016-07-13 VITALS — BP 114/69 | HR 76 | Wt 160.2 lb

## 2016-07-13 DIAGNOSIS — I10 Essential (primary) hypertension: Secondary | ICD-10-CM | POA: Diagnosis not present

## 2016-07-13 DIAGNOSIS — F3342 Major depressive disorder, recurrent, in full remission: Secondary | ICD-10-CM | POA: Diagnosis not present

## 2016-07-13 DIAGNOSIS — E7800 Pure hypercholesterolemia, unspecified: Secondary | ICD-10-CM | POA: Diagnosis not present

## 2016-07-13 DIAGNOSIS — C9110 Chronic lymphocytic leukemia of B-cell type not having achieved remission: Secondary | ICD-10-CM

## 2016-07-13 DIAGNOSIS — D692 Other nonthrombocytopenic purpura: Secondary | ICD-10-CM | POA: Diagnosis not present

## 2016-07-13 DIAGNOSIS — R69 Illness, unspecified: Secondary | ICD-10-CM | POA: Diagnosis not present

## 2016-07-13 DIAGNOSIS — C9190 Lymphoid leukemia, unspecified not having achieved remission: Secondary | ICD-10-CM | POA: Diagnosis not present

## 2016-07-13 MED ORDER — LORAZEPAM 1 MG PO TABS: 0.5000 mg | ORAL_TABLET | Freq: Two times a day (BID) | ORAL | 1 refills | Status: DC | PRN

## 2016-07-13 NOTE — Assessment & Plan Note (Signed)
The current medical regimen is effective;  continue present plan and medications.  

## 2016-07-13 NOTE — Assessment & Plan Note (Signed)
stable °

## 2016-07-13 NOTE — Progress Notes (Signed)
BP 114/69   Pulse 76   Wt 160 lb 3.2 oz (72.7 kg)   SpO2 97%   BMI 25.09 kg/m    Subjective:    Patient ID: Katherine [name], female    DOB: [birth year], 76 y.o.   MRN: [id number]  HPI: Katherine [name] is a 76 y.o. female  Chief Complaint  Patient presents with  . Follow-up  . Hyperlipidemia  . Hypertension   Follow-up medication all in all doing well taking cholesterol blood pressure and depression medications without problems. Takes lorazepam 1 mg half in the morning and half in the evening and does well with that dose hasn't increased over time. Will need refills. Relevant past medical, surgical, family and social history reviewed and updated as indicated. Interim medical history since our last visit reviewed. Allergies and medications reviewed and updated.  Review of Systems  Constitutional: Negative.   Respiratory: Negative.   Cardiovascular: Negative.     Per HPI unless specifically indicated above     Objective:    BP 114/69   Pulse 76   Wt 160 lb 3.2 oz (72.7 kg)   SpO2 97%   BMI 25.09 kg/m   Wt Readings from Last 3 Encounters:  07/13/16 160 lb 3.2 oz (72.7 kg)  03/05/16 160 lb 15 oz (73 kg)  01/07/16 161 lb (73 kg)    Physical Exam  Constitutional: She is oriented to person, place, and time. She appears well-developed and well-nourished.  HENT:  Head: Normocephalic and atraumatic.  Eyes: Conjunctivae and EOM are normal.  Neck: Normal range of motion.  Cardiovascular: Normal rate, regular rhythm and normal heart sounds.   Pulmonary/Chest: Effort normal and breath sounds normal.  Musculoskeletal: Normal range of motion.  Neurological: She is alert and oriented to person, place, and time.  Skin: No erythema.  Psychiatric: She has a normal mood and affect. Her behavior is normal. Judgment and thought content normal.    Results for orders placed or performed in visit on 06/04/16  CBC with Differential  Result Value Ref Range   WBC 31.3 (H)  3.6 - 11.0 K/uL   RBC 4.78 3.80 - 5.20 MIL/uL   Hemoglobin 15.7 12.0 - 16.0 g/dL   HCT 46.3 35.0 - 47.0 %   MCV 96.8 80.0 - 100.0 fL   MCH 32.9 26.0 - 34.0 pg   MCHC 34.0 32.0 - 36.0 g/dL   RDW 13.5 11.5 - 14.5 %   Platelets 159 150 - 440 K/uL   Neutrophils Relative % 14 %   Neutro Abs 4.4 1.4 - 6.5 K/uL   Lymphocytes Relative 83 %   Lymphs Abs 26.0 (H) 1.0 - 3.6 K/uL   Monocytes Relative 2 %   Monocytes Absolute 0.6 0.2 - 0.9 K/uL   Eosinophils Relative 1 %   Eosinophils Absolute 0.2 0 - 0.7 K/uL   Basophils Relative 0 %   Basophils Absolute 0.1 0 - 0.1 K/uL      Assessment & Plan:   Problem List Items Addressed This Visit      Cardiovascular and Mediastinum   Senile purpura ([place])    stable      Essential hypertension - Primary    The current medical regimen is effective;  continue present plan and medications.       Relevant Orders   Basic metabolic panel     Other   Depression   Relevant Medications   LORazepam (ATIVAN) 1 MG tablet   CLL (chronic lymphocytic leukemia) (  [place])    stable      Relevant Medications   LORazepam (ATIVAN) 1 MG tablet   Hypercholesterolemia   Relevant Orders   Basic metabolic panel       Follow up plan: Return in about 6 months (around 01/13/2017) for Physical Exam.

## 2016-07-14 ENCOUNTER — Encounter: Payer: Self-pay | Admitting: Family Medicine

## 2016-07-14 LAB — BASIC METABOLIC PANEL
BUN/Creatinine Ratio: 9 — ABNORMAL LOW (ref 12–28)
BUN: 7 mg/dL — ABNORMAL LOW (ref 8–27)
CO2: 25 mmol/L (ref 18–29)
Calcium: 9.8 mg/dL (ref 8.7–10.3)
Chloride: 106 mmol/L (ref 96–106)
Creatinine, Ser: 0.76 mg/dL (ref 0.57–1.00)
GFR calc Af Amer: 89 mL/min/{1.73_m2} (ref >59)
GFR calc non Af Amer: 77 mL/min/{1.73_m2} (ref >59)
Glucose: 101 mg/dL — ABNORMAL HIGH (ref 65–99)
Potassium: 4.6 mmol/L (ref 3.5–5.2)
Sodium: 145 mmol/L — ABNORMAL HIGH (ref 134–144)

## 2016-08-08 ENCOUNTER — Other Ambulatory Visit: Payer: Self-pay | Admitting: Family Medicine

## 2016-08-31 NOTE — Progress Notes (Signed)
[place]  Telephone:(336) [phone] Fax:(336) [phone]  ID: Deveney Bayon OB: 1940-10-01  MR#: [id number]  LDJ#:[id number]  Patient Care Team: [name], MD as PCP - General (Family Medicine) [name], MD as PCP - Family Medicine (Family Medicine) Forest Gleason, MD (Oncology) Oneta Rack, MD (Dermatology) Manya Silvas, MD (Gastroenterology) Bary Castilla Forest Gleason, MD (General Surgery)  CHIEF COMPLAINT: CLL  INTERVAL HISTORY: Patient returns to clinic today for repeat laboratory work and further evaluation. She continues to have intermittent problems with sinuses and allergies, but otherwise feels well and is asymptomatic. She denies any fevers, weight loss, or night sweats. She has no neurologic complaints. She denies weakness or fatigue. She has no chest pain or shortness of breath. She denies any nausea, vomiting, diarrhea, or constipation. She has no urinary complaints. Patient offers no further specific complaints today.  REVIEW OF SYSTEMS:   Review of Systems  Constitutional: Negative for fever, malaise/fatigue and weight loss.  HENT: Positive for congestion and sinus pain.   Respiratory: Negative.  Negative for cough and shortness of breath.   Cardiovascular: Negative.  Negative for chest pain and leg swelling.  Gastrointestinal: Negative for blood in stool, constipation, diarrhea, nausea and vomiting.  Genitourinary: Negative.  Negative for frequency, hematuria and urgency.  Musculoskeletal: Negative.   Skin: Negative.  Negative for rash.  Neurological: Negative.  Negative for tingling, weakness and headaches.  Psychiatric/Behavioral: The patient is not nervous/anxious and does not have insomnia.     As per HPI. Otherwise, a complete review of systems is negative.  PAST MEDICAL HISTORY: Past Medical History:  Diagnosis Date  . Allergy   . Anxiety   . Depression   . GERD (gastroesophageal reflux disease)   . Hemorrhoids     . Leukemia, lymphoid ([place])   . Lobar pneumonia ([place])   . Osteopenia   . Personal history of chemotherapy     PAST SURGICAL HISTORY: Past Surgical History:  Procedure Laterality Date  . ABDOMINAL HYSTERECTOMY    . APPENDECTOMY    . BREAST BIOPSY Left    bx x 3-neg  . COLON SURGERY     sigmoid resection  . COLONOSCOPY     2007, 2012  . COLONOSCOPY WITH PROPOFOL N/A 09/17/2015   Procedure: COLONOSCOPY WITH PROPOFOL;  Surgeon: Robert Bellow, MD;  Location: [facility] ENDOSCOPY;  Service: Endoscopy;  Laterality: N/A;  . SPINE SURGERY     L4-5    FAMILY HISTORY Family History  Problem Relation Age of Onset  . Osteoporosis Mother   . Hypertension Father   . Heart attack Father   . Stroke Maternal Grandfather   . Breast cancer Sister 75       ADVANCED DIRECTIVES:    HEALTH MAINTENANCE: Social History  Substance Use Topics  . Smoking status: Current Every Day Smoker    Packs/day: 1.00    Years: 35.00    Types: Cigarettes  . Smokeless tobacco: Never Used  . Alcohol use No     Allergies  Allergen Reactions  . Augmentin [Amoxicillin-Pot Clavulanate] Swelling    tongue  . Biaxin [Clarithromycin] Swelling and Rash    tongue    Current Outpatient Prescriptions  Medication Sig Dispense Refill  . albuterol (PROVENTIL HFA;VENTOLIN HFA) 108 (90 BASE) MCG/ACT inhaler Inhale 1-2 puffs into the lungs every 6 (six) hours as needed for wheezing or shortness of breath. Dispense with aerochamber 1 Inhaler 0  . aspirin EC 81 MG tablet Take 81 mg by  mouth every other day.     Marland Kitchen atorvastatin (LIPITOR) 10 MG tablet TAKE 1 TABLET BY MOUTH DAILY 30 tablet 3  . benazepril (LOTENSIN) 40 MG tablet Take 1 tablet (40 mg total) by mouth daily. 90 tablet 4  . calcium-vitamin D (OSCAL WITH D) 500-200 MG-UNIT tablet Take 1 tablet by mouth daily with breakfast. Dose of drugs may change based on what is on sale at the time    . LORazepam (ATIVAN) 1 MG tablet Take 0.5 tablets (0.5 mg total) by  mouth 2 (two) times daily as needed for anxiety. 90 tablet 1  . Multiple Vitamins-Minerals (MULTIVITAMIN WITH MINERALS) tablet Take 1 tablet by mouth daily.    Marland Kitchen omeprazole (PRILOSEC) 20 MG capsule Take 1 capsule (20 mg total) by mouth daily as needed. 90 capsule 4  . PARoxetine (PAXIL) 30 MG tablet Take 1 tablet (30 mg total) by mouth daily. 90 tablet 4  . Triamcinolone Acetonide (NASACORT AQ NA) Place 2 Doses into the nose daily as needed.      No current facility-administered medications for this visit.     OBJECTIVE: Vitals:   09/03/16 1031  BP: 138/73  Pulse: 79  Resp: 18  Temp: (!) 97.3 F (36.3 C)     Body mass index is 25 kg/m.    ECOG FS:0 - Asymptomatic  General: Well-developed, well-nourished, no acute distress. Eyes: anicteric sclera. Lungs: Bilateral inspiratory wheeze, diminished throughout. Heart: Regular rate and rhythm. No rubs, murmurs, or gallops. Musculoskeletal: No edema, cyanosis, or clubbing. Neuro: Alert, answering all questions appropriately. Cranial nerves grossly intact. Skin: No rashes or petechiae noted. Psych: Normal affect.   LAB RESULTS:  Lab Results  Component Value Date   NA 145 (H) 07/13/2016   K 4.6 07/13/2016   CL 106 07/13/2016   CO2 25 07/13/2016   GLUCOSE 101 (H) 07/13/2016   BUN 7 (L) 07/13/2016   CREATININE 0.76 07/13/2016   CALCIUM 9.8 07/13/2016   PROT 5.8 (L) 12/08/2015   ALBUMIN 4.3 12/08/2015   AST 33 03/09/2016   ALT 10 03/09/2016   ALKPHOS 80 12/08/2015   BILITOT 0.7 12/08/2015   GFRNONAA 77 07/13/2016   GFRAA 89 07/13/2016    Lab Results  Component Value Date   WBC 36.2 (H) 09/03/2016   NEUTROABS 5.1 09/03/2016   HGB 15.5 09/03/2016   HCT 46.2 09/03/2016   MCV 97.8 09/03/2016   PLT 143 (L) 09/03/2016     STUDIES: No results found.  ASSESSMENT: CLL, Rai stage 0  PLAN:    1. CLL: Patient's white blood cell count continues to slowly trend up and is now 36. Her doubling time is still less than 6  months, but I suspect she will require repeat treatment with single agent weekly Rituxan in the next 6-12 months.  Patient completed 4 cycles of weekly Rituxan treatments in May 2017. No further intervention is needed at this time. Return to clinic in 3 months for laboratory work and further evaluation. 2. Thrombocytopenia: Mild, monitor. Likely secondary to progression of CLL. 3. Tobacco abuse: CT screening previously discussed and patient plans to get a referral from her primary care physician. 4. Sinus allergies: Continue OTC treatments as needed.   Patient expressed understanding and was in agreement with this plan. She also understands that She can call clinic at any time with any questions, concerns, or complaints.    Lloyd Huger, MD 09/03/16 11:07 AM

## 2016-09-03 ENCOUNTER — Other Ambulatory Visit: Payer: Self-pay | Admitting: *Deleted

## 2016-09-03 ENCOUNTER — Inpatient Hospital Stay: Payer: Medicare HMO | Attending: Oncology

## 2016-09-03 ENCOUNTER — Inpatient Hospital Stay (HOSPITAL_BASED_OUTPATIENT_CLINIC_OR_DEPARTMENT_OTHER): Payer: Medicare HMO | Admitting: Oncology

## 2016-09-03 ENCOUNTER — Encounter: Payer: Self-pay | Admitting: Oncology

## 2016-09-03 VITALS — BP 138/73 | HR 79 | Temp 97.3°F | Resp 18 | Ht 67.0 in | Wt 159.6 lb

## 2016-09-03 DIAGNOSIS — M8580 Other specified disorders of bone density and structure, unspecified site: Secondary | ICD-10-CM | POA: Diagnosis not present

## 2016-09-03 DIAGNOSIS — Z880 Allergy status to penicillin: Secondary | ICD-10-CM | POA: Diagnosis not present

## 2016-09-03 DIAGNOSIS — Z79899 Other long term (current) drug therapy: Secondary | ICD-10-CM | POA: Insufficient documentation

## 2016-09-03 DIAGNOSIS — C9110 Chronic lymphocytic leukemia of B-cell type not having achieved remission: Secondary | ICD-10-CM | POA: Diagnosis not present

## 2016-09-03 DIAGNOSIS — F17210 Nicotine dependence, cigarettes, uncomplicated: Secondary | ICD-10-CM | POA: Insufficient documentation

## 2016-09-03 DIAGNOSIS — F418 Other specified anxiety disorders: Secondary | ICD-10-CM

## 2016-09-03 DIAGNOSIS — Z7982 Long term (current) use of aspirin: Secondary | ICD-10-CM | POA: Diagnosis not present

## 2016-09-03 DIAGNOSIS — K219 Gastro-esophageal reflux disease without esophagitis: Secondary | ICD-10-CM | POA: Diagnosis not present

## 2016-09-03 DIAGNOSIS — J309 Allergic rhinitis, unspecified: Secondary | ICD-10-CM | POA: Diagnosis not present

## 2016-09-03 DIAGNOSIS — D696 Thrombocytopenia, unspecified: Secondary | ICD-10-CM

## 2016-09-03 DIAGNOSIS — R69 Illness, unspecified: Secondary | ICD-10-CM | POA: Diagnosis not present

## 2016-09-03 LAB — CBC WITH DIFFERENTIAL/PLATELET
Basophils Absolute: 0.1 10*3/uL (ref 0–0.1)
Basophils Relative: 0 %
Eosinophils Absolute: 0.2 10*3/uL (ref 0–0.7)
Eosinophils Relative: 1 %
HCT: 46.2 % (ref 35.0–47.0)
Hemoglobin: 15.5 g/dL (ref 12.0–16.0)
Lymphocytes Relative: 82 %
Lymphs Abs: 29.8 10*3/uL — ABNORMAL HIGH (ref 1.0–3.6)
MCH: 32.8 pg (ref 26.0–34.0)
MCHC: 33.5 g/dL (ref 32.0–36.0)
MCV: 97.8 fL (ref 80.0–100.0)
Monocytes Absolute: 1 10*3/uL — ABNORMAL HIGH (ref 0.2–0.9)
Monocytes Relative: 3 %
Neutro Abs: 5.1 10*3/uL (ref 1.4–6.5)
Neutrophils Relative %: 14 %
Platelets: 143 10*3/uL — ABNORMAL LOW (ref 150–440)
RBC: 4.73 MIL/uL (ref 3.80–5.20)
RDW: 13.2 % (ref 11.5–14.5)
WBC: 36.2 10*3/uL — ABNORMAL HIGH (ref 3.6–11.0)

## 2016-11-05 ENCOUNTER — Other Ambulatory Visit: Payer: Self-pay | Admitting: Family Medicine

## 2016-11-23 ENCOUNTER — Telehealth: Payer: Self-pay | Admitting: Family Medicine

## 2016-11-23 NOTE — Telephone Encounter (Signed)
Entered in error

## 2016-11-24 ENCOUNTER — Ambulatory Visit (INDEPENDENT_AMBULATORY_CARE_PROVIDER_SITE_OTHER): Payer: Medicare HMO

## 2016-11-24 ENCOUNTER — Encounter: Payer: Self-pay | Admitting: *Deleted

## 2016-11-24 ENCOUNTER — Ambulatory Visit
Admission: EM | Admit: 2016-11-24 | Discharge: 2016-11-24 | Disposition: A | Discharge status: Home / Self Care | Payer: Medicare HMO | Attending: Family Medicine | Admitting: Family Medicine

## 2016-11-24 DIAGNOSIS — R1032 Left lower quadrant pain: Secondary | ICD-10-CM

## 2016-11-24 DIAGNOSIS — K8020 Calculus of gallbladder without cholecystitis without obstruction: Secondary | ICD-10-CM | POA: Diagnosis not present

## 2016-11-24 MED ORDER — METRONIDAZOLE 500 MG PO TABS: 500.0000 mg | ORAL_TABLET | Freq: Three times a day (TID) | ORAL | 0 refills | Status: DC

## 2016-11-24 MED ORDER — CIPROFLOXACIN HCL 500 MG PO TABS: 500.0000 mg | ORAL_TABLET | Freq: Two times a day (BID) | ORAL | 0 refills | Status: DC

## 2016-11-24 MED ORDER — SIMETHICONE 80 MG PO CHEW: 80.0000 mg | CHEWABLE_TABLET | Freq: Four times a day (QID) | ORAL | 0 refills | Status: DC | PRN

## 2016-11-24 NOTE — ED Provider Notes (Signed)
[facility]    CSN: [id number] Arrival date & time: 11/24/16  1010     History   Chief Complaint Chief Complaint  Patient presents with  . Abdominal Pain    HPI Kimberlea Schlag is a 76 y.o. female.   The history is provided by the patient.  Abdominal Pain  Pain location:  LLQ Pain quality: aching and bloating   Pain radiates to:  Does not radiate Pain severity:  Moderate Onset quality:  Sudden Duration:  7 days Timing:  Constant Progression:  Unchanged Chronicity:  New Context: not alcohol use, not awakening from sleep, not diet changes, not eating, not laxative use, not medication withdrawal, not previous surgeries, not recent illness, not recent sexual activity, not retching, not sick contacts, not suspicious food intake and not trauma   Relieved by:  Nothing Ineffective treatments:  OTC medications Associated symptoms: belching   Associated symptoms: no anorexia, no chest pain, no chills, no constipation, no cough, no diarrhea, no dysuria, no fatigue, no fever, no flatus, no hematemesis, no hematochezia, no hematuria, no melena, no nausea, no shortness of breath, no sore throat, no vaginal bleeding, no vaginal discharge and no vomiting   Risk factors: being elderly   Risk factors: no alcohol abuse, no aspirin use, has not had multiple surgeries, no NSAID use, not obese, not pregnant and no recent hospitalization     Past Medical History:  Diagnosis Date  . Allergy   . Anxiety   . Depression   . GERD (gastroesophageal reflux disease)   . Hemorrhoids   . Leukemia, lymphoid ([place])   . Lobar pneumonia ([place])   . Osteopenia   . Personal history of chemotherapy     Patient Active Problem List   Diagnosis Date Noted  . Hypercholesterolemia 01/08/2016  . Aortic atherosclerosis ([place]) 01/07/2016  . History of colonic polyps 08/14/2015  . CLL (chronic lymphocytic leukemia) ([place]) 05/28/2015  . Senile purpura ([place]) 01/06/2015  . Essential hypertension  01/06/2015  . Depression 01/06/2015    Past Surgical History:  Procedure Laterality Date  . ABDOMINAL HYSTERECTOMY    . APPENDECTOMY    . BREAST BIOPSY Left    bx x 3-neg  . COLON SURGERY     sigmoid resection  . COLONOSCOPY     2007, 2012  . COLONOSCOPY WITH PROPOFOL N/A 09/17/2015   Procedure: COLONOSCOPY WITH PROPOFOL;  Surgeon: Robert Bellow, MD;  Location: [facility] ENDOSCOPY;  Service: Endoscopy;  Laterality: N/A;  . SPINE SURGERY     L4-5    OB History    No data available       Home Medications    Prior to Admission medications   Medication Sig Start Date End Date Taking? Authorizing Provider  aspirin EC 81 MG tablet Take 81 mg by mouth every other day.    Yes [provider]  atorvastatin (LIPITOR) 10 MG tablet TAKE 1 TABLET BY MOUTH DAILY 11/05/16  Yes Crissman, Jeannette How, MD  benazepril (LOTENSIN) 40 MG tablet Take 1 tablet (40 mg total) by mouth daily. 01/07/16  Yes Crissman, Jeannette How, MD  LORazepam (ATIVAN) 1 MG tablet Take 0.5 tablets (0.5 mg total) by mouth 2 (two) times daily as needed for anxiety. 07/13/16  Yes Crissman, Jeannette How, MD  Multiple Vitamins-Minerals (MULTIVITAMIN WITH MINERALS) tablet Take 1 tablet by mouth daily.   Yes [provider]  omeprazole (PRILOSEC) 20 MG capsule Take 1 capsule (20 mg total) by mouth daily as needed. 01/07/16  Yes [name], MD  PARoxetine (PAXIL) 30 MG tablet Take 1 tablet (30 mg total) by mouth daily. 01/07/16  Yes Crissman, Jeannette How, MD  Triamcinolone Acetonide (NASACORT AQ NA) Place 2 Doses into the nose daily as needed.    Yes [provider]  albuterol (PROVENTIL HFA;VENTOLIN HFA) 108 (90 BASE) MCG/ACT inhaler Inhale 1-2 puffs into the lungs every 6 (six) hours as needed for wheezing or shortness of breath. Dispense with aerochamber 09/22/14   Melynda Ripple, MD  calcium-vitamin D (OSCAL WITH D) 500-200 MG-UNIT tablet Take 1 tablet by mouth daily with breakfast. Dose of drugs may change based on  what is on sale at the time    [provider]  ciprofloxacin (CIPRO) 500 MG tablet Take 1 tablet (500 mg total) by mouth every 12 (twelve) hours. 11/24/16   Norval Gable, MD  metroNIDAZOLE (FLAGYL) 500 MG tablet Take 1 tablet (500 mg total) by mouth 3 (three) times daily. 11/24/16   Norval Gable, MD  simethicone (MYLICON) 80 MG chewable tablet Chew 1 tablet (80 mg total) by mouth every 6 (six) hours as needed for flatulence. 11/24/16   Norval Gable, MD    Family History Family History  Problem Relation Age of Onset  . Osteoporosis Mother   . Hypertension Father   . Heart attack Father   . Stroke Maternal Grandfather   . Breast cancer Sister 35    Social History Social History  Substance Use Topics  . Smoking status: Current Every Day Smoker    Packs/day: 1.00    Years: 35.00    Types: Cigarettes  . Smokeless tobacco: Never Used  . Alcohol use No     Allergies   Augmentin [amoxicillin-pot clavulanate] and Biaxin [clarithromycin]   Review of Systems Review of Systems  Constitutional: Negative for chills, fatigue and fever.  HENT: Negative for sore throat.   Respiratory: Negative for cough and shortness of breath.   Cardiovascular: Negative for chest pain.  Gastrointestinal: Positive for abdominal pain. Negative for anorexia, constipation, diarrhea, flatus, hematemesis, hematochezia, melena, nausea and vomiting.  Genitourinary: Negative for dysuria, hematuria, vaginal bleeding and vaginal discharge.     Physical Exam Triage Vital Signs ED Triage Vitals  Enc Vitals Group     BP 11/24/16 1028 128/70     Pulse Rate 11/24/16 1028 77     Resp 11/24/16 1028 16     Temp 11/24/16 1028 98.6 F (37 C)     Temp Source 11/24/16 1028 Oral     SpO2 11/24/16 1028 97 %     Weight 11/24/16 1029 156 lb (70.8 kg)     Height 11/24/16 1029 5\' 7"  (1.702 m)     Head Circumference --      Peak Flow --      Pain Score 11/24/16 1030 8     Pain Loc --      Pain Edu? --       Excl. in [place]? --    No data found.   Updated Vital Signs BP 128/70 (BP Location: Left Arm)   Pulse 77   Temp 98.6 F (37 C) (Oral)   Resp 16   Ht 5\' 7"  (1.702 m)   Wt 156 lb (70.8 kg)   SpO2 97%   BMI 24.43 kg/m   Visual Acuity Right Eye Distance:   Left Eye Distance:   Bilateral Distance:    Right Eye Near:   Left Eye Near:    Bilateral Near:  Physical Exam  Constitutional: She appears well-developed and well-nourished. No distress.  Cardiovascular: Normal rate, regular rhythm and normal heart sounds.   Pulmonary/Chest: Effort normal and breath sounds normal. No respiratory distress.  Abdominal: Soft. Bowel sounds are normal. She exhibits no distension and no mass. There is tenderness (left mid and lower quadrant; no rebound or guarding). There is no rebound and no guarding.  Skin: She is not diaphoretic.  Nursing note and vitals reviewed.    UC Treatments / Results  Labs (all labs ordered are listed, but only abnormal results are displayed) Labs Reviewed - No data to display  EKG  EKG Interpretation None       Radiology Dg Abd 2 Views  Result Date: 11/24/2016 CLINICAL DATA:  Right upper quadrant pain for several days EXAM: ABDOMEN - 2 VIEW COMPARISON:  06/16/2011 FINDINGS: Scattered large small bowel gas is noted. Scoliosis is again noted concave to the left at the L1-2 level. This appears slightly increased when compared with the prior exam. Postsurgical changes are noted pelvis. Diffuse aortic calcifications are seen. Multiple calcified gallstones are again identified. No free air is seen. No acute bony abnormality is noted. IMPRESSION: Chronic changes without acute abnormality. Multiple gallstones remain in the gallbladder. Right upper quadrant ultrasound may be helpful for further evaluation. Electronically Signed   By: Inez Catalina M.D.   On: 11/24/2016 11:32    Procedures Procedures (including critical care time)  Medications Ordered in  UC Medications - No data to display   Initial Impression / Assessment and Plan / UC Course  I have reviewed the triage vital signs and the nursing notes.  Pertinent labs & imaging results that were available during my care of the patient were reviewed by me and considered in my medical decision making (see chart for details).       Final Clinical Impressions(s) / UC Diagnoses   Final diagnoses:  Abdominal pain, left lower quadrant  (possibly early diverticulitis based on clinical findings)   New Prescriptions Discharge Medication List as of 11/24/2016 11:42 AM    START taking these medications   Details  ciprofloxacin (CIPRO) 500 MG tablet Take 1 tablet (500 mg total) by mouth every 12 (twelve) hours., Starting Wed 11/24/2016, Normal    metroNIDAZOLE (FLAGYL) 500 MG tablet Take 1 tablet (500 mg total) by mouth 3 (three) times daily., Starting Wed 11/24/2016, Normal    simethicone (MYLICON) 80 MG chewable tablet Chew 1 tablet (80 mg total) by mouth every 6 (six) hours as needed for flatulence., Starting Wed 11/24/2016, Normal       1. x-ray results and diagnosis reviewed with patient 2. rx as per orders above; reviewed possible side effects, interactions, risks and benefits  3. Recommend supportive treatment with clear liquids, then advance diet slowly as tolerated 4. Follow-up prn if symptoms worsen or don't improve  Controlled Substance Prescriptions [place] Controlled Substance Registry consulted? Not Applicable   Norval Gable, MD 11/24/16 1221

## 2016-11-24 NOTE — ED Triage Notes (Signed)
Patient started having symptoms LUQ pain and excessive burping 3 days ago. No recent GI history.

## 2016-11-30 NOTE — Progress Notes (Signed)
[place]  Telephone:(336) [phone] Fax:(336) [phone]  ID: Katherine [name] OB: 1940/10/18  MR#: [id number]  BPZ#:[id number]  Patient Care Team: [name], MD as PCP - General (Family Medicine) [name], MD as PCP - Family Medicine (Family Medicine) Forest Gleason, MD (Oncology) Oneta Rack, MD (Dermatology) Manya Silvas, MD (Gastroenterology) Bary Castilla Forest Gleason, MD (General Surgery)  CHIEF COMPLAINT: CLL  INTERVAL HISTORY: Patient returns to clinic today for repeat laboratory work and further evaluation. She currently feels well and is asymptomatic. She denies any fevers, weight loss, or night sweats. She has no neurologic complaints. She denies weakness or fatigue. She has no chest pain or shortness of breath. She denies any nausea, vomiting, diarrhea, or constipation. She has no urinary complaints. Patient offers no specific complaints today.  REVIEW OF SYSTEMS:   Review of Systems  Constitutional: Negative for fever, malaise/fatigue and weight loss.  HENT: Negative.  Negative for congestion and sinus pain.   Respiratory: Negative.  Negative for cough and shortness of breath.   Cardiovascular: Negative.  Negative for chest pain and leg swelling.  Gastrointestinal: Negative for blood in stool, constipation, diarrhea, nausea and vomiting.  Genitourinary: Negative.  Negative for frequency, hematuria and urgency.  Musculoskeletal: Negative.   Skin: Negative.  Negative for rash.  Neurological: Negative.  Negative for tingling, weakness and headaches.  Psychiatric/Behavioral: The patient is not nervous/anxious and does not have insomnia.     As per HPI. Otherwise, a complete review of systems is negative.  PAST MEDICAL HISTORY: Past Medical History:  Diagnosis Date  . Allergy   . Anxiety   . Depression   . GERD (gastroesophageal reflux disease)   . Hemorrhoids   . Leukemia, lymphoid ([place])   . Lobar pneumonia ([place])   .  Osteopenia   . Personal history of chemotherapy     PAST SURGICAL HISTORY: Past Surgical History:  Procedure Laterality Date  . ABDOMINAL HYSTERECTOMY    . APPENDECTOMY    . BREAST BIOPSY Left    bx x 3-neg  . COLON SURGERY     sigmoid resection  . COLONOSCOPY     2007, 2012  . COLONOSCOPY WITH PROPOFOL N/A 09/17/2015   Procedure: COLONOSCOPY WITH PROPOFOL;  Surgeon: Robert Bellow, MD;  Location: [facility] ENDOSCOPY;  Service: Endoscopy;  Laterality: N/A;  . SPINE SURGERY     L4-5    FAMILY HISTORY Family History  Problem Relation Age of Onset  . Osteoporosis Mother   . Hypertension Father   . Heart attack Father   . Stroke Maternal Grandfather   . Breast cancer Sister 55       ADVANCED DIRECTIVES:    HEALTH MAINTENANCE: Social History  Substance Use Topics  . Smoking status: Current Every Day Smoker    Packs/day: 1.00    Years: 35.00    Types: Cigarettes  . Smokeless tobacco: Never Used  . Alcohol use No     Allergies  Allergen Reactions  . Augmentin [Amoxicillin-Pot Clavulanate] Swelling    tongue  . Biaxin [Clarithromycin] Swelling and Rash    tongue    Current Outpatient Prescriptions  Medication Sig Dispense Refill  . aspirin EC 81 MG tablet Take 81 mg by mouth every other day.     Marland Kitchen atorvastatin (LIPITOR) 10 MG tablet TAKE 1 TABLET BY MOUTH DAILY 30 tablet 0  . benazepril (LOTENSIN) 40 MG tablet Take 1 tablet (40 mg total) by mouth daily. 90 tablet 4  . calcium-vitamin  D (OSCAL WITH D) 500-200 MG-UNIT tablet Take 1 tablet by mouth daily with breakfast. Dose of drugs may change based on what is on sale at the time    . diphenhydrAMINE (BENADRYL) 25 MG tablet Take 25 mg by mouth at bedtime as needed.    . fexofenadine (ALLEGRA) 180 MG tablet Take 180 mg by mouth daily.    Marland Kitchen LORazepam (ATIVAN) 1 MG tablet Take 0.5 tablets (0.5 mg total) by mouth 2 (two) times daily as needed for anxiety. 90 tablet 1  . Multiple Vitamins-Minerals (MULTIVITAMIN WITH  MINERALS) tablet Take 1 tablet by mouth daily.    Marland Kitchen omeprazole (PRILOSEC) 20 MG capsule Take 1 capsule (20 mg total) by mouth daily as needed. 90 capsule 4  . PARoxetine (PAXIL) 30 MG tablet Take 1 tablet (30 mg total) by mouth daily. 90 tablet 4  . simethicone (MYLICON) 80 MG chewable tablet Chew 1 tablet (80 mg total) by mouth every 6 (six) hours as needed for flatulence. 30 tablet 0  . albuterol (PROVENTIL HFA;VENTOLIN HFA) 108 (90 BASE) MCG/ACT inhaler Inhale 1-2 puffs into the lungs every 6 (six) hours as needed for wheezing or shortness of breath. Dispense with aerochamber (Patient not taking: Reported on 12/03/2016) 1 Inhaler 0  . ciprofloxacin (CIPRO) 500 MG tablet Take 1 tablet (500 mg total) by mouth every 12 (twelve) hours. (Patient not taking: Reported on 12/03/2016) 14 tablet 0  . metroNIDAZOLE (FLAGYL) 500 MG tablet Take 1 tablet (500 mg total) by mouth 3 (three) times daily. (Patient not taking: Reported on 12/03/2016) 21 tablet 0  . Triamcinolone Acetonide (NASACORT AQ NA) Place 2 Doses into the nose daily as needed.      No current facility-administered medications for this visit.     OBJECTIVE: Vitals:   12/03/16 1043  BP: 121/77  Pulse: 69  Resp: 18  Temp: (!) 96.6 F (35.9 C)     Body mass index is 24.69 kg/m.    ECOG FS:0 - Asymptomatic  General: Well-developed, well-nourished, no acute distress. Eyes: anicteric sclera. Lungs: Bilateral inspiratory wheeze, diminished throughout. Heart: Regular rate and rhythm. No rubs, murmurs, or gallops. Musculoskeletal: No edema, cyanosis, or clubbing. Neuro: Alert, answering all questions appropriately. Cranial nerves grossly intact. Skin: No rashes or petechiae noted. Psych: Normal affect.   LAB RESULTS:  Lab Results  Component Value Date   NA 145 (H) 07/13/2016   K 4.6 07/13/2016   CL 106 07/13/2016   CO2 25 07/13/2016   GLUCOSE 101 (H) 07/13/2016   BUN 7 (L) 07/13/2016   CREATININE 0.76 07/13/2016   CALCIUM 9.8  07/13/2016   PROT 5.8 (L) 12/08/2015   ALBUMIN 4.3 12/08/2015   AST 33 03/09/2016   ALT 10 03/09/2016   ALKPHOS 80 12/08/2015   BILITOT 0.7 12/08/2015   GFRNONAA 77 07/13/2016   GFRAA 89 07/13/2016    Lab Results  Component Value Date   WBC 43.6 (H) 12/03/2016   NEUTROABS 3.9 12/03/2016   HGB 15.6 12/03/2016   HCT 46.2 12/03/2016   MCV 98.3 12/03/2016   PLT 159 12/03/2016     STUDIES: Dg Abd 2 Views  Result Date: 11/24/2016 CLINICAL DATA:  Right upper quadrant pain for several days EXAM: ABDOMEN - 2 VIEW COMPARISON:  06/16/2011 FINDINGS: Scattered large small bowel gas is noted. Scoliosis is again noted concave to the left at the L1-2 level. This appears slightly increased when compared with the prior exam. Postsurgical changes are noted pelvis. Diffuse aortic calcifications are seen.  Multiple calcified gallstones are again identified. No free air is seen. No acute bony abnormality is noted. IMPRESSION: Chronic changes without acute abnormality. Multiple gallstones remain in the gallbladder. Right upper quadrant ultrasound may be helpful for further evaluation. Electronically Signed   By: Inez Catalina M.D.   On: 11/24/2016 11:32    ASSESSMENT: CLL, Rai stage 0  PLAN:    1. CLL: Patient's white blood cell count continues to slowly trend up and is now 43.6. Her doubling time is still less than 6 months, but I suspect she will require repeat treatment with single agent weekly Rituxan in the next 6-12 months.  Patient completed 4 cycles of weekly Rituxan treatments in May 2017. No further intervention is needed at this time. Return to clinic in 3 months for laboratory work only and then in 6 months for laboratory work and and further evaluation. 2. Thrombocytopenia: Resolved. Monitor. 3. Tobacco abuse: CT screening previously discussed and patient plans to get a referral from her primary care physician.  Approximately 20 minutes was spent in discussion of which greater than 50% was  consultation.   Patient expressed understanding and was in agreement with this plan. She also understands that She can call clinic at any time with any questions, concerns, or complaints.    Lloyd Huger, MD 12/03/16 1:29 PM

## 2016-12-03 ENCOUNTER — Inpatient Hospital Stay: Payer: Medicare HMO

## 2016-12-03 ENCOUNTER — Inpatient Hospital Stay: Payer: Medicare HMO | Attending: Oncology | Admitting: Oncology

## 2016-12-03 VITALS — BP 121/77 | HR 69 | Temp 96.6°F | Resp 18 | Wt 157.6 lb

## 2016-12-03 DIAGNOSIS — F418 Other specified anxiety disorders: Secondary | ICD-10-CM | POA: Diagnosis not present

## 2016-12-03 DIAGNOSIS — F419 Anxiety disorder, unspecified: Secondary | ICD-10-CM | POA: Diagnosis not present

## 2016-12-03 DIAGNOSIS — Z803 Family history of malignant neoplasm of breast: Secondary | ICD-10-CM | POA: Diagnosis not present

## 2016-12-03 DIAGNOSIS — Z880 Allergy status to penicillin: Secondary | ICD-10-CM | POA: Insufficient documentation

## 2016-12-03 DIAGNOSIS — Z79899 Other long term (current) drug therapy: Secondary | ICD-10-CM | POA: Diagnosis not present

## 2016-12-03 DIAGNOSIS — K219 Gastro-esophageal reflux disease without esophagitis: Secondary | ICD-10-CM | POA: Diagnosis not present

## 2016-12-03 DIAGNOSIS — R69 Illness, unspecified: Secondary | ICD-10-CM | POA: Diagnosis not present

## 2016-12-03 DIAGNOSIS — C9110 Chronic lymphocytic leukemia of B-cell type not having achieved remission: Secondary | ICD-10-CM | POA: Diagnosis not present

## 2016-12-03 DIAGNOSIS — M8580 Other specified disorders of bone density and structure, unspecified site: Secondary | ICD-10-CM | POA: Diagnosis not present

## 2016-12-03 DIAGNOSIS — K8020 Calculus of gallbladder without cholecystitis without obstruction: Secondary | ICD-10-CM | POA: Diagnosis not present

## 2016-12-03 DIAGNOSIS — F17210 Nicotine dependence, cigarettes, uncomplicated: Secondary | ICD-10-CM | POA: Insufficient documentation

## 2016-12-03 DIAGNOSIS — D72829 Elevated white blood cell count, unspecified: Secondary | ICD-10-CM | POA: Diagnosis not present

## 2016-12-03 DIAGNOSIS — Z9225 Personal history of immunosupression therapy: Secondary | ICD-10-CM | POA: Insufficient documentation

## 2016-12-03 DIAGNOSIS — Z7982 Long term (current) use of aspirin: Secondary | ICD-10-CM | POA: Insufficient documentation

## 2016-12-03 DIAGNOSIS — M419 Scoliosis, unspecified: Secondary | ICD-10-CM

## 2016-12-03 LAB — CBC WITH DIFFERENTIAL/PLATELET
Basophils Absolute: 0.1 10*3/uL (ref 0–0.1)
Basophils Relative: 0 %
Eosinophils Absolute: 0.2 10*3/uL (ref 0–0.7)
Eosinophils Relative: 1 %
HCT: 46.2 % (ref 35.0–47.0)
Hemoglobin: 15.6 g/dL (ref 12.0–16.0)
Lymphocytes Relative: 89 %
Lymphs Abs: 39 10*3/uL — ABNORMAL HIGH (ref 1.0–3.6)
MCH: 33.2 pg (ref 26.0–34.0)
MCHC: 33.8 g/dL (ref 32.0–36.0)
MCV: 98.3 fL (ref 80.0–100.0)
Monocytes Absolute: 0.3 10*3/uL (ref 0.2–0.9)
Monocytes Relative: 1 %
Neutro Abs: 3.9 10*3/uL (ref 1.4–6.5)
Neutrophils Relative %: 9 %
Platelets: 159 10*3/uL (ref 150–440)
RBC: 4.71 MIL/uL (ref 3.80–5.20)
RDW: 13.2 % (ref 11.5–14.5)
WBC: 43.6 10*3/uL — ABNORMAL HIGH (ref 3.6–11.0)

## 2016-12-03 NOTE — Progress Notes (Signed)
Patient denies any concerns today.  

## 2016-12-07 ENCOUNTER — Other Ambulatory Visit: Payer: Self-pay

## 2016-12-07 MED ORDER — ATORVASTATIN CALCIUM 10 MG PO TABS: 10.0000 mg | ORAL_TABLET | Freq: Every day | ORAL | 1 refills | Status: DC

## 2016-12-07 NOTE — Telephone Encounter (Signed)
Patient last seen 07/13/16 and has f/up 01/17/17. Pharmacy requests 90 day supply.

## 2016-12-13 ENCOUNTER — Other Ambulatory Visit: Payer: Self-pay | Admitting: Family Medicine

## 2016-12-13 DIAGNOSIS — Z1231 Encounter for screening mammogram for malignant neoplasm of breast: Secondary | ICD-10-CM

## 2017-01-04 ENCOUNTER — Ambulatory Visit (INDEPENDENT_AMBULATORY_CARE_PROVIDER_SITE_OTHER): Payer: Medicare HMO | Admitting: Family Medicine

## 2017-01-04 ENCOUNTER — Encounter: Payer: Self-pay | Admitting: Family Medicine

## 2017-01-04 VITALS — BP 107/65 | HR 75 | Temp 98.7°F | Ht 66.75 in | Wt 156.1 lb

## 2017-01-04 DIAGNOSIS — Z0000 Encounter for general adult medical examination without abnormal findings: Secondary | ICD-10-CM

## 2017-01-04 DIAGNOSIS — C9190 Lymphoid leukemia, unspecified not having achieved remission: Secondary | ICD-10-CM

## 2017-01-04 DIAGNOSIS — C9110 Chronic lymphocytic leukemia of B-cell type not having achieved remission: Secondary | ICD-10-CM

## 2017-01-04 DIAGNOSIS — F3342 Major depressive disorder, recurrent, in full remission: Secondary | ICD-10-CM

## 2017-01-04 DIAGNOSIS — Z23 Encounter for immunization: Secondary | ICD-10-CM

## 2017-01-04 DIAGNOSIS — I10 Essential (primary) hypertension: Secondary | ICD-10-CM

## 2017-01-04 DIAGNOSIS — E7800 Pure hypercholesterolemia, unspecified: Secondary | ICD-10-CM | POA: Diagnosis not present

## 2017-01-04 DIAGNOSIS — F419 Anxiety disorder, unspecified: Secondary | ICD-10-CM

## 2017-01-04 LAB — MICROSCOPIC EXAMINATION: Bacteria, UA: NONE SEEN

## 2017-01-04 LAB — UA/M W/RFLX CULTURE, ROUTINE
Bilirubin, UA: NEGATIVE
Glucose, UA: NEGATIVE
Leukocytes, UA: NEGATIVE
Nitrite, UA: NEGATIVE
Specific Gravity, UA: 1.02 (ref 1.005–1.030)
Urobilinogen, Ur: 0.2 mg/dL (ref 0.2–1.0)
pH, UA: 6 (ref 5.0–7.5)

## 2017-01-04 MED ORDER — LORAZEPAM 1 MG PO TABS: 0.5000 mg | ORAL_TABLET | Freq: Two times a day (BID) | ORAL | 0 refills | Status: DC | PRN

## 2017-01-04 MED ORDER — PAROXETINE HCL 30 MG PO TABS: 30.0000 mg | ORAL_TABLET | Freq: Every day | ORAL | 1 refills | Status: DC

## 2017-01-04 MED ORDER — BENAZEPRIL HCL 40 MG PO TABS: 40.0000 mg | ORAL_TABLET | Freq: Every day | ORAL | 1 refills | Status: DC

## 2017-01-04 MED ORDER — OMEPRAZOLE 20 MG PO CPDR: 20.0000 mg | DELAYED_RELEASE_CAPSULE | Freq: Every day | ORAL | 4 refills | Status: DC | PRN

## 2017-01-04 MED ORDER — ATORVASTATIN CALCIUM 10 MG PO TABS: 10.0000 mg | ORAL_TABLET | Freq: Every day | ORAL | 1 refills | Status: DC

## 2017-01-04 NOTE — Assessment & Plan Note (Signed)
Well controlled on benazepril, continue current regimen

## 2017-01-04 NOTE — Patient Instructions (Signed)

## 2017-01-04 NOTE — Assessment & Plan Note (Signed)
Well controlled with paxil, continue current regimen

## 2017-01-04 NOTE — Progress Notes (Signed)
BP 107/65 (BP Location: Left Arm, Patient Position: Sitting, Cuff Size: Normal)   Pulse 75   Temp 98.7 F (37.1 C) (Temporal)   Ht 5' 6.75" (1.695 m)   Wt 156 lb 1.6 oz (70.8 kg)   SpO2 95%   BMI 24.63 kg/m    Subjective:    Patient ID: Katherine [name], female    DOB: 12-Feb-1941, 76 y.o.   MRN: [id number]  HPI: Katherine [name] is a 76 y.o. female presenting on 01/04/2017 for comprehensive medical examination. Current medical complaints include:none  Mammogram scheduled. UTD on all other health maintenance. Wanting information on Shingrix.   Doing well on all of her medications, taking them faithfully without side effects.  BPs WNL when checking at home, denies CP, SOB, dizziness, HAs.   No myalgias, fatigue, abdominal pain with lipitor. Eats a healthy diet and tries to stay active.   Moods and anxiety well controlled on paxil and ativan. Takes one lorazepam daily typically.   Depression Screen done today and results listed below:  Depression screen [facility] 2/9 01/07/2016 01/06/2015  Decreased Interest 1 0  Down, Depressed, Hopeless 0 1  PHQ - 2 Score 1 1    The patient does not have a history of falls. I did not complete a risk assessment for falls. A plan of care for falls was not documented.   Past Medical History:  Past Medical History:  Diagnosis Date  . Allergy   . Anxiety   . Depression   . GERD (gastroesophageal reflux disease)   . Hemorrhoids   . Leukemia, lymphoid ([place])   . Lobar pneumonia ([place])   . Osteopenia   . Personal history of chemotherapy     Surgical History:  Past Surgical History:  Procedure Laterality Date  . ABDOMINAL HYSTERECTOMY    . APPENDECTOMY    . BREAST BIOPSY Left    bx x 3-neg  . COLON SURGERY     sigmoid resection  . COLONOSCOPY     2007, 2012  . COLONOSCOPY WITH PROPOFOL N/A 09/17/2015   Procedure: COLONOSCOPY WITH PROPOFOL;  Surgeon: Robert Bellow, MD;  Location: [facility] ENDOSCOPY;  Service: Endoscopy;  Laterality:  N/A;  . SPINE SURGERY     L4-5    Medications:  Current Outpatient Medications on File Prior to Visit  Medication Sig  . aspirin EC 81 MG tablet Take 81 mg by mouth every other day.   . calcium-vitamin D (OSCAL WITH D) 500-200 MG-UNIT tablet Take 1 tablet by mouth daily with breakfast. Dose of drugs may change based on what is on sale at the time  . diphenhydrAMINE (BENADRYL) 25 MG tablet Take 25 mg by mouth at bedtime as needed.  . fexofenadine (ALLEGRA) 180 MG tablet Take 180 mg by mouth daily.  . Multiple Vitamins-Minerals (MULTIVITAMIN WITH MINERALS) tablet Take 1 tablet by mouth daily.  . simethicone (MYLICON) 80 MG chewable tablet Chew 1 tablet (80 mg total) by mouth every 6 (six) hours as needed for flatulence.  . Triamcinolone Acetonide (NASACORT AQ NA) Place 2 Doses into the nose daily as needed.    No current facility-administered medications on file prior to visit.     Allergies:  Allergies  Allergen Reactions  . Augmentin [Amoxicillin-Pot Clavulanate] Swelling    tongue  . Biaxin [Clarithromycin] Swelling and Rash    tongue    Social History:  Social History   Socioeconomic History  . Marital status: Married    Spouse name: Not on file  .  Number of children: Not on file  . Years of education: Not on file  . Highest education level: Not on file  Social Needs  . Financial resource strain: Not on file  . Food insecurity - worry: Not on file  . Food insecurity - inability: Not on file  . Transportation needs - medical: Not on file  . Transportation needs - non-medical: Not on file  Occupational History  . Not on file  Tobacco Use  . Smoking status: Current Every Day Smoker    Packs/day: 1.00    Years: 35.00    Pack years: 35.00    Types: Cigarettes  . Smokeless tobacco: Never Used  Substance and Sexual Activity  . Alcohol use: No    Alcohol/week: 0.0 oz  . Drug use: No  . Sexual activity: Not on file  Other Topics Concern  . Not on file  Social  History Narrative  . Not on file   Social History   Tobacco Use  Smoking Status Current Every Day Smoker  . Packs/day: 1.00  . Years: 35.00  . Pack years: 35.00  . Types: Cigarettes  Smokeless Tobacco Never Used   Social History   Substance and Sexual Activity  Alcohol Use No  . Alcohol/week: 0.0 oz    Family History:  Family History  Problem Relation Age of Onset  . Osteoporosis Mother   . Hypertension Father   . Heart attack Father   . Stroke Maternal Grandfather   . Breast cancer Sister 22    Past medical history, surgical history, medications, allergies, family history and social history reviewed with patient today and changes made to appropriate areas of the chart.   Review of Systems - General ROS: negative Psychological ROS: negative Ophthalmic ROS: negative ENT ROS: negative Breast ROS: negative for breast lumps Respiratory ROS: no cough, shortness of breath, or wheezing Cardiovascular ROS: no chest pain or dyspnea on exertion Gastrointestinal ROS: no abdominal pain, change in bowel habits, or black or bloody stools Genito-Urinary ROS: no dysuria, trouble voiding, or hematuria Musculoskeletal ROS: negative Neurological ROS: no TIA or stroke symptoms Dermatological ROS: negative All other ROS negative except what is listed above and in the HPI.      Objective:    BP 107/65 (BP Location: Left Arm, Patient Position: Sitting, Cuff Size: Normal)   Pulse 75   Temp 98.7 F (37.1 C) (Temporal)   Ht 5' 6.75" (1.695 m)   Wt 156 lb 1.6 oz (70.8 kg)   SpO2 95%   BMI 24.63 kg/m   Wt Readings from Last 3 Encounters:  01/04/17 156 lb 1.6 oz (70.8 kg)  12/03/16 157 lb 10.1 oz (71.5 kg)  11/24/16 156 lb (70.8 kg)    Physical Exam  Constitutional: She is oriented to person, place, and time. She appears well-developed and well-nourished. No distress.  HENT:  Head: Atraumatic.  Right Ear: External ear normal.  Left Ear: External ear normal.  Nose: Nose normal.   Mouth/Throat: Oropharynx is clear and moist. No oropharyngeal exudate.  Eyes: Conjunctivae are normal. Pupils are equal, round, and reactive to light. No scleral icterus.  Neck: Normal range of motion. Neck supple. No thyromegaly present.  Cardiovascular: Normal rate, regular rhythm, normal heart sounds and intact distal pulses.  Pulmonary/Chest: Effort normal and breath sounds normal. No respiratory distress. Right breast exhibits no mass, no skin change and no tenderness. Left breast exhibits no mass, no skin change and no tenderness. Breasts are symmetrical.  Abdominal: Soft. Bowel  sounds are normal. She exhibits no mass. There is no tenderness.  Musculoskeletal: Normal range of motion. She exhibits no edema or tenderness.  Lymphadenopathy:    She has no cervical adenopathy.    She has no axillary adenopathy.  Neurological: She is alert and oriented to person, place, and time. No cranial nerve deficit.  Skin: Skin is warm and dry. No rash noted.  Psychiatric: She has a normal mood and affect. Her behavior is normal.  Nursing note and vitals reviewed.   Results for orders placed or performed in visit on 01/04/17  Microscopic Examination  Result Value Ref Range   WBC, UA 0-5 0 - 5 /hpf   RBC, UA 0-2 0 - 2 /hpf   Epithelial Cells (non renal) 0-10 0 - 10 /hpf   Casts Present None seen /lpf   Cast Type Granular casts (A) N/A   Mucus, UA Present Not Estab.   Bacteria, UA None seen None seen/Few  CBC with Differential/Platelet  Result Value Ref Range   WBC 52.0 (>) 3.4 - 10.8 x10E3/uL   RBC 4.54 3.77 - 5.28 x10E6/uL   Hemoglobin 15.5 11.1 - 15.9 g/dL   Hematocrit 45.5 34.0 - 46.6 %   MCV 100 (H) 79 - 97 fL   MCH 34.1 (H) 26.6 - 33.0 pg   MCHC 34.1 31.5 - 35.7 g/dL   RDW 13.8 12.3 - 15.4 %   Platelets 155 150 - 379 x10E3/uL   Neutrophils 7 Not Estab. %   Lymphs 93 Not Estab. %   Monocytes 0 Not Estab. %   Eos 0 Not Estab. %   Basos 0 Not Estab. %   Neutrophils Absolute 3.6 1.4  - 7.0 x10E3/uL   Lymphocytes Absolute 48.0 (H) 0.7 - 3.1 x10E3/uL   Monocytes Absolute 0.1 0.1 - 0.9 x10E3/uL   EOS (ABSOLUTE) 0.1 0.0 - 0.4 x10E3/uL   Basophils Absolute 0.1 0.0 - 0.2 x10E3/uL   Immature Granulocytes 0 Not Estab. %   Immature Grans (Abs) 0.0 0.0 - 0.1 x10E3/uL   Hematology Comments: Note:   Comprehensive metabolic panel  Result Value Ref Range   Glucose 96 65 - 99 mg/dL   BUN 10 8 - 27 mg/dL   Creatinine, Ser 0.86 0.57 - 1.00 mg/dL   GFR calc non Af Amer 66 >59 mL/min/1.73   GFR calc Af Amer 76 >59 mL/min/1.73   BUN/Creatinine Ratio 12 12 - 28   Sodium 147 (H) 134 - 144 mmol/L   Potassium 4.4 3.5 - 5.2 mmol/L   Chloride 104 96 - 106 mmol/L   CO2 27 20 - 29 mmol/L   Calcium 10.4 (H) 8.7 - 10.3 mg/dL   Total Protein 6.4 6.0 - 8.5 g/dL   Albumin 4.9 (H) 3.5 - 4.8 g/dL   Globulin, Total 1.5 1.5 - 4.5 g/dL   Albumin/Globulin Ratio 3.3 (H) 1.2 - 2.2   Bilirubin Total 0.5 0.0 - 1.2 mg/dL   Alkaline Phosphatase 93 39 - 117 IU/L   AST 18 0 - 40 IU/L   ALT 8 0 - 32 IU/L  Lipid Panel w/o Chol/HDL Ratio  Result Value Ref Range   Cholesterol, Total 204 (H) 100 - 199 mg/dL   Triglycerides 221 (H) 0 - 149 mg/dL   HDL 37 (L) >39 mg/dL   VLDL Cholesterol Cal 44 (H) 5 - 40 mg/dL   LDL Calculated 123 (H) 0 - 99 mg/dL  UA/M w/rflx Culture, Routine  Result Value Ref Range   Specific Gravity,  UA 1.020 1.005 - 1.030   pH, UA 6.0 5.0 - 7.5   Color, UA Orange Yellow   Appearance Ur Clear Clear   Leukocytes, UA Negative Negative   Protein, UA Trace (A) Negative/Trace   Glucose, UA Negative Negative   Ketones, UA Trace (A) Negative   RBC, UA Trace (A) Negative   Bilirubin, UA Negative Negative   Urobilinogen, Ur 0.2 0.2 - 1.0 mg/dL   Nitrite, UA Negative Negative   Microscopic Examination See below:       Assessment & Plan:   Problem List Items Addressed This Visit      Cardiovascular and Mediastinum   Essential hypertension    Well controlled on benazepril, continue  current regimen      Relevant Medications   atorvastatin (LIPITOR) 10 MG tablet   benazepril (LOTENSIN) 40 MG tablet   Other Relevant Orders   CBC with Differential/Platelet (Completed)   Comprehensive metabolic panel (Completed)   UA/M w/rflx Culture, Routine (Completed)     Other   Depression    Well controlled with paxil, continue current regimen      Relevant Medications   LORazepam (ATIVAN) 1 MG tablet   PARoxetine (PAXIL) 30 MG tablet   CLL (chronic lymphocytic leukemia) (HCC)    Managed by Dr. Grayland Ormond closely. Continue per their recommendations      Relevant Medications   LORazepam (ATIVAN) 1 MG tablet   Other Relevant Orders   CBC with Differential/Platelet (Completed)   Hypercholesterolemia - Primary    Await lipid results, continue current regimen on lipitor      Relevant Medications   atorvastatin (LIPITOR) 10 MG tablet   benazepril (LOTENSIN) 40 MG tablet   Other Relevant Orders   Lipid Panel w/o Chol/HDL Ratio (Completed)   Anxiety    Fairly well controlled, typically taking 1 ativan daily.       Relevant Medications   LORazepam (ATIVAN) 1 MG tablet   PARoxetine (PAXIL) 30 MG tablet    Other Visit Diagnoses    Annual physical exam       Need for influenza vaccination       Relevant Orders   Flu vaccine HIGH DOSE PF (Fluzone High dose) (Completed)       Follow up plan: Return in about 6 months (around 07/04/2017) for BP, Chol f/u.   LABORATORY TESTING:  - Pap smear: not applicable  IMMUNIZATIONS:   - Tdap: Tetanus vaccination status reviewed: last tetanus booster within 10 years. - Influenza: Up to date - Pneumovax: Up to date - Prevnar: Up to date - Zostavax vaccine: Up to date  SCREENING: -Mammogram: scheduled  - Colonoscopy: Up to date  - Bone Density: Up to date   PATIENT COUNSELING:   Advised to take 1 mg of folate supplement per day if capable of pregnancy.   Sexuality: Discussed sexually transmitted diseases, partner  selection, use of condoms, avoidance of unintended pregnancy  and contraceptive alternatives.   Advised to avoid cigarette smoking.  I discussed with the patient that most people either abstain from alcohol or drink within safe limits (<=14/week and <=4 drinks/occasion for males, <=7/weeks and <= 3 drinks/occasion for females) and that the risk for alcohol disorders and other health effects rises proportionally with the number of drinks per week and how often a drinker exceeds daily limits.  Discussed cessation/primary prevention of drug use and availability of treatment for abuse.   Diet: Encouraged to adjust caloric intake to maintain  or achieve ideal body weight,  to reduce intake of dietary saturated fat and total fat, to limit sodium intake by avoiding high sodium foods and not adding table salt, and to maintain adequate dietary potassium and calcium preferably from fresh fruits, vegetables, and low-fat dairy products.    stressed the importance of regular exercise  Injury prevention: Discussed safety belts, safety helmets, smoke detector, smoking near bedding or upholstery.   Dental health: Discussed importance of regular tooth brushing, flossing, and dental visits.    NEXT PREVENTATIVE PHYSICAL DUE IN 1 YEAR. Return in about 6 months (around 07/04/2017) for BP, Chol f/u.

## 2017-01-04 NOTE — Assessment & Plan Note (Signed)
Await lipid results, continue current regimen on lipitor

## 2017-01-04 NOTE — Assessment & Plan Note (Signed)
Fairly well controlled, typically taking 1 ativan daily.

## 2017-01-04 NOTE — Assessment & Plan Note (Signed)
Managed by Dr. Grayland Ormond closely. Continue per their recommendations

## 2017-01-05 LAB — COMPREHENSIVE METABOLIC PANEL
ALT: 8 IU/L (ref 0–32)
AST: 18 IU/L (ref 0–40)
Albumin/Globulin Ratio: 3.3 — ABNORMAL HIGH (ref 1.2–2.2)
Albumin: 4.9 g/dL — ABNORMAL HIGH (ref 3.5–4.8)
Alkaline Phosphatase: 93 IU/L (ref 39–117)
BUN/Creatinine Ratio: 12 (ref 12–28)
BUN: 10 mg/dL (ref 8–27)
Bilirubin Total: 0.5 mg/dL (ref 0.0–1.2)
CO2: 27 mmol/L (ref 20–29)
Calcium: 10.4 mg/dL — ABNORMAL HIGH (ref 8.7–10.3)
Chloride: 104 mmol/L (ref 96–106)
Creatinine, Ser: 0.86 mg/dL (ref 0.57–1.00)
GFR calc Af Amer: 76 mL/min/{1.73_m2} (ref >59)
GFR calc non Af Amer: 66 mL/min/{1.73_m2} (ref >59)
Globulin, Total: 1.5 g/dL (ref 1.5–4.5)
Glucose: 96 mg/dL (ref 65–99)
Potassium: 4.4 mmol/L (ref 3.5–5.2)
Sodium: 147 mmol/L — ABNORMAL HIGH (ref 134–144)
Total Protein: 6.4 g/dL (ref 6.0–8.5)

## 2017-01-05 LAB — CBC WITH DIFFERENTIAL/PLATELET
Basophils Absolute: 0.1 10*3/uL (ref 0.0–0.2)
Basos: 0 %
EOS (ABSOLUTE): 0.1 10*3/uL (ref 0.0–0.4)
Eos: 0 %
Hematocrit: 45.5 % (ref 34.0–46.6)
Hemoglobin: 15.5 g/dL (ref 11.1–15.9)
Immature Grans (Abs): 0 10*3/uL (ref 0.0–0.1)
Immature Granulocytes: 0 %
Lymphocytes Absolute: 48 10*3/uL — ABNORMAL HIGH (ref 0.7–3.1)
Lymphs: 93 %
MCH: 34.1 pg — ABNORMAL HIGH (ref 26.6–33.0)
MCHC: 34.1 g/dL (ref 31.5–35.7)
MCV: 100 fL — ABNORMAL HIGH (ref 79–97)
Monocytes Absolute: 0.1 10*3/uL (ref 0.1–0.9)
Monocytes: 0 %
Neutrophils Absolute: 3.6 10*3/uL (ref 1.4–7.0)
Neutrophils: 7 %
Platelets: 155 10*3/uL (ref 150–379)
RBC: 4.54 x10E6/uL (ref 3.77–5.28)
RDW: 13.8 % (ref 12.3–15.4)
WBC: 52 10*3/uL (ref 3.4–10.8)

## 2017-01-05 LAB — LIPID PANEL W/O CHOL/HDL RATIO
Cholesterol, Total: 204 mg/dL — ABNORMAL HIGH (ref 100–199)
HDL: 37 mg/dL — ABNORMAL LOW (ref >39)
LDL Calculated: 123 mg/dL — ABNORMAL HIGH (ref 0–99)
Triglycerides: 221 mg/dL — ABNORMAL HIGH (ref 0–149)
VLDL Cholesterol Cal: 44 mg/dL — ABNORMAL HIGH (ref 5–40)

## 2017-01-10 ENCOUNTER — Ambulatory Visit
Admission: RE | Admit: 2017-01-10 | Discharge: 2017-01-10 | Disposition: A | Discharge status: Home / Self Care | Payer: Medicare HMO | Source: Ambulatory Visit | Attending: Family Medicine | Admitting: Family Medicine

## 2017-01-10 DIAGNOSIS — Z1231 Encounter for screening mammogram for malignant neoplasm of breast: Secondary | ICD-10-CM | POA: Diagnosis not present

## 2017-01-17 ENCOUNTER — Ambulatory Visit: Payer: Medicare HMO

## 2017-01-17 ENCOUNTER — Encounter: Payer: Medicare HMO | Admitting: Family Medicine

## 2017-01-24 ENCOUNTER — Other Ambulatory Visit: Payer: Self-pay | Admitting: Family Medicine

## 2017-01-24 ENCOUNTER — Ambulatory Visit (INDEPENDENT_AMBULATORY_CARE_PROVIDER_SITE_OTHER): Payer: Medicare HMO

## 2017-01-24 VITALS — BP 122/70 | HR 78 | Temp 98.4°F | Ht 67.0 in | Wt 158.3 lb

## 2017-01-24 DIAGNOSIS — Z0000 Encounter for general adult medical examination without abnormal findings: Secondary | ICD-10-CM

## 2017-01-24 DIAGNOSIS — F3342 Major depressive disorder, recurrent, in full remission: Secondary | ICD-10-CM

## 2017-01-24 MED ORDER — LORAZEPAM 1 MG PO TABS: 0.5000 mg | ORAL_TABLET | Freq: Two times a day (BID) | ORAL | 2 refills | Status: DC | PRN

## 2017-01-24 NOTE — Patient Instructions (Addendum)
Katherine [name] , Thank you for taking time to come for your Medicare Wellness Visit. I appreciate your ongoing commitment to your health goals. Please review the following plan we discussed and let me know if I can assist you in the future.   Screening recommendations/referrals: Colonoscopy: completed 09/17/2015 Mammogram: completed 01/10/2017 Bone Density: completed 05/19/2009 Recommended yearly ophthalmology/optometry visit for glaucoma screening and checkup Recommended yearly dental visit for hygiene and checkup  Vaccinations: Influenza vaccine: up to date Pneumococcal vaccine: up to date Tdap vaccine: up to date Shingles vaccine: up to date  Advanced directives: Please bring a copy of your health care power of attorney and living will to the office at your convenience.  Conditions/risks identified: Recommend drinking at least 6-8 glasses of water a day . Smoking cessation discussed  Next appointment: Follow up in one year for your annual wellness exam.    Preventive Care 76 Years and Older, Female Preventive care refers to lifestyle choices and visits with your health care provider that can promote health and wellness. What does preventive care include?  A yearly physical exam. This is also called an annual well check.  Dental exams once or twice a year.  Routine eye exams. Ask your health care provider how often you should have your eyes checked.  Personal lifestyle choices, including:  Daily care of your teeth and gums.  Regular physical activity.  Eating a healthy diet.  Avoiding tobacco and drug use.  Limiting alcohol use.  Practicing safe sex.  Taking low-dose aspirin every day.  Taking vitamin and mineral supplements as recommended by your health care provider. What happens during an annual well check? The services and screenings done by your health care provider during your annual well check will depend on your age, overall health, lifestyle risk factors, and  family history of disease. Counseling  Your health care provider may ask you questions about your:  Alcohol use.  Tobacco use.  Drug use.  Emotional well-being.  Home and relationship well-being.  Sexual activity.  Eating habits.  History of falls.  Memory and ability to understand (cognition).  Work and work Statistician.  Reproductive health. Screening  You may have the following tests or measurements:  Height, weight, and BMI.  Blood pressure.  Lipid and cholesterol levels. These may be checked every 5 years, or more frequently if you are over 54 years old.  Skin check.  Lung cancer screening. You may have this screening every year starting at age 76 if you have a 30-pack-year history of smoking and currently smoke or have quit within the past 15 years.  Fecal occult blood test (FOBT) of the stool. You may have this test every year starting at age 76.  Flexible sigmoidoscopy or colonoscopy. You may have a sigmoidoscopy every 5 years or a colonoscopy every 10 years starting at age 76.  Hepatitis C blood test.  Hepatitis B blood test.  Sexually transmitted disease (STD) testing.  Diabetes screening. This is done by checking your blood sugar (glucose) after you have not eaten for a while (fasting). You may have this done every 1-3 years.  Bone density scan. This is done to screen for osteoporosis. You may have this done starting at age 76.  Mammogram. This may be done every 1-2 years. Talk to your health care provider about how often you should have regular mammograms. Talk with your health care provider about your test results, treatment options, and if necessary, the need for more tests. Vaccines  Your health  care provider may recommend certain vaccines, such as:  Influenza vaccine. This is recommended every year.  Tetanus, diphtheria, and acellular pertussis (Tdap, Td) vaccine. You may need a Td booster every 10 years.  Zoster vaccine. You may need this  after age 76.  Pneumococcal 13-valent conjugate (PCV13) vaccine. One dose is recommended after age 76.  Pneumococcal polysaccharide (PPSV23) vaccine. One dose is recommended after age 76. Talk to your health care provider about which screenings and vaccines you need and how often you need them. This information is not intended to replace advice given to you by your health care provider. Make sure you discuss any questions you have with your health care provider. Document Released: 03/07/2015 Document Revised: 10/29/2015 Document Reviewed: 12/10/2014 Elsevier Interactive Patient Education  2017 [place] Prevention in the Home Falls can cause injuries. They can happen to people of all ages. There are many things you can do to make your home safe and to help prevent falls. What can I do on the outside of my home?  Regularly fix the edges of walkways and driveways and fix any cracks.  Remove anything that might make you trip as you walk through a door, such as a raised step or threshold.  Trim any bushes or trees on the path to your home.  Use bright outdoor lighting.  Clear any walking paths of anything that might make someone trip, such as rocks or tools.  Regularly check to see if handrails are loose or broken. Make sure that both sides of any steps have handrails.  Any raised decks and porches should have guardrails on the edges.  Have any leaves, snow, or ice cleared regularly.  Use sand or salt on walking paths during winter.  Clean up any spills in your garage right away. This includes oil or grease spills. What can I do in the bathroom?  Use night lights.  Install grab bars by the toilet and in the tub and shower. Do not use towel bars as grab bars.  Use non-skid mats or decals in the tub or shower.  If you need to sit down in the shower, use a plastic, non-slip stool.  Keep the floor dry. Clean up any water that spills on the floor as soon as it  happens.  Remove soap buildup in the tub or shower regularly.  Attach bath mats securely with double-sided non-slip rug tape.  Do not have throw rugs and other things on the floor that can make you trip. What can I do in the bedroom?  Use night lights.  Make sure that you have a light by your bed that is easy to reach.  Do not use any sheets or blankets that are too big for your bed. They should not hang down onto the floor.  Have a firm chair that has side arms. You can use this for support while you get dressed.  Do not have throw rugs and other things on the floor that can make you trip. What can I do in the kitchen?  Clean up any spills right away.  Avoid walking on wet floors.  Keep items that you use a lot in easy-to-reach places.  If you need to reach something above you, use a strong step stool that has a grab bar.  Keep electrical cords out of the way.  Do not use floor polish or wax that makes floors slippery. If you must use wax, use non-skid floor wax.  Do not have  throw rugs and other things on the floor that can make you trip. What can I do with my stairs?  Do not leave any items on the stairs.  Make sure that there are handrails on both sides of the stairs and use them. Fix handrails that are broken or loose. Make sure that handrails are as long as the stairways.  Check any carpeting to make sure that it is firmly attached to the stairs. Fix any carpet that is loose or worn.  Avoid having throw rugs at the top or bottom of the stairs. If you do have throw rugs, attach them to the floor with carpet tape.  Make sure that you have a light switch at the top of the stairs and the bottom of the stairs. If you do not have them, ask someone to add them for you. What else can I do to help prevent falls?  Wear shoes that:  Do not have high heels.  Have rubber bottoms.  Are comfortable and fit you well.  Are closed at the toe. Do not wear sandals.  If you  use a stepladder:  Make sure that it is fully opened. Do not climb a closed stepladder.  Make sure that both sides of the stepladder are locked into place.  Ask someone to hold it for you, if possible.  Clearly mark and make sure that you can see:  Any grab bars or handrails.  First and last steps.  Where the edge of each step is.  Use tools that help you move around (mobility aids) if they are needed. These include:  Canes.  Walkers.  Scooters.  Crutches.  Turn on the lights when you go into a dark area. Replace any light bulbs as soon as they burn out.  Set up your furniture so you have a clear path. Avoid moving your furniture around.  If any of your floors are uneven, fix them.  If there are any pets around you, be aware of where they are.  Review your medicines with your doctor. Some medicines can make you feel dizzy. This can increase your chance of falling. Ask your doctor what other things that you can do to help prevent falls. This information is not intended to replace advice given to you by your health care provider. Make sure you discuss any questions you have with your health care provider. Document Released: 12/05/2008 Document Revised: 07/17/2015 Document Reviewed: 03/15/2014 Elsevier Interactive Patient Education  2017 [employer].

## 2017-01-24 NOTE — Progress Notes (Signed)
Subjective:   Katherine [name] is a 76 y.o. female who presents for Medicare Annual (Subsequent) preventive examination.  Review of Systems:  Cardiac Risk Factors include: hypertension;advanced age (>35men, >69 women);dyslipidemia     Objective:     Vitals: BP 122/70 (BP Location: Left Arm, Patient Position: Sitting)   Pulse 78   Temp 98.4 F (36.9 C) (Temporal)   Ht 5\' 7"  (1.702 m)   Wt 158 lb 4.8 oz (71.8 kg)   BMI 24.79 kg/m   Body mass index is 24.79 kg/m.  Advanced Directives 01/24/2017 09/03/2016 03/05/2016 09/12/2015 08/09/2014  Does Patient Have a Medical Advance Directive? Yes Yes No No Yes  Type of Paramedic of [place];Living will [place];Living will - - [place];Living will  Does patient want to make changes to medical advance directive? - No - Patient declined No - Patient declined - No - Patient declined  Copy of [place] in Chart? No - copy requested - - - -  Would patient like information on creating a medical advance directive? - - - No - patient declined information -    Tobacco Social History   Tobacco Use  Smoking Status Current Every Day Smoker  . Packs/day: 1.00  . Years: 35.00  . Pack years: 35.00  . Types: Cigarettes  Smokeless Tobacco Never Used     Ready to quit: No Counseling given: Yes   Clinical Intake:  Pre-visit preparation completed: Yes  Pain : No/denies pain     Nutritional Status: BMI of 19-24  Normal Nutritional Risks: None Diabetes: No  Activities of Daily Living: Independent Ambulation: Independent with device- listed below Home Assistive Devices/Equipment: Dentures (specify type), Eyeglasses(parital dentures) Medication Administration: Independent Home Management: Independent  Barriers to Care Management & Learning: None  Do you feel unsafe in your current relationship?: No(married) Do you feel physically threatened by others?:  No Anyone hurting you at home, work, or school?: No Unable to ask?: No  How often do you need to have someone help you when you read instructions, pamphlets, or other written materials from your doctor or pharmacy?: 1 - Never What is the last grade level you completed in school?: 12th grade  Interpreter Needed?: No  Information entered by :: Tiffany Hill,lPN   Past Medical History:  Diagnosis Date  . Allergy   . Anxiety   . Depression   . GERD (gastroesophageal reflux disease)   . Hemorrhoids   . Leukemia, lymphoid ([place])    CLL  . Lobar pneumonia ([place])   . Osteopenia   . Personal history of chemotherapy    Past Surgical History:  Procedure Laterality Date  . ABDOMINAL HYSTERECTOMY    . APPENDECTOMY    . BREAST BIOPSY Left    bx x 3-neg  . COLON SURGERY     sigmoid resection  . COLONOSCOPY     2007, 2012  . COLONOSCOPY WITH PROPOFOL N/A 09/17/2015   Procedure: COLONOSCOPY WITH PROPOFOL;  Surgeon: Robert Bellow, MD;  Location: [facility] ENDOSCOPY;  Service: Endoscopy;  Laterality: N/A;  . SPINE SURGERY     L4-5   Family History  Problem Relation Age of Onset  . Osteoporosis Mother   . Hypertension Father   . Heart attack Father   . Stroke Maternal Grandfather   . Breast cancer Sister 17   Social History   Socioeconomic History  . Marital status: Married    Spouse name: None  .  Number of children: None  . Years of education: 50  . Highest education level: 12th grade  Social Needs  . Financial resource strain: Not hard at all  . Food insecurity - worry: Never true  . Food insecurity - inability: Never true  . Transportation needs - medical: No  . Transportation needs - non-medical: No  Occupational History  . None  Tobacco Use  . Smoking status: Current Every Day Smoker    Packs/day: 1.00    Years: 35.00    Pack years: 35.00    Types: Cigarettes  . Smokeless tobacco: Never Used  Substance and Sexual Activity  . Alcohol use: No    Alcohol/week: 0.0 oz    . Drug use: No  . Sexual activity: None  Other Topics Concern  . None  Social History Narrative  . None    Outpatient Encounter Medications as of 01/24/2017  Medication Sig  . aspirin EC 81 MG tablet Take 81 mg by mouth every other day.   Marland Kitchen atorvastatin (LIPITOR) 10 MG tablet Take 1 tablet (10 mg total) daily by mouth.  . benazepril (LOTENSIN) 40 MG tablet Take 1 tablet (40 mg total) daily by mouth.  . calcium-vitamin D (OSCAL WITH D) 500-200 MG-UNIT tablet Take 1 tablet by mouth daily with breakfast. Dose of drugs may change based on what is on sale at the time  . diphenhydrAMINE (BENADRYL) 25 MG tablet Take 25 mg by mouth at bedtime as needed.  . fexofenadine (ALLEGRA) 180 MG tablet Take 180 mg by mouth daily. As needed  . Multiple Vitamins-Minerals (MULTIVITAMIN WITH MINERALS) tablet Take 1 tablet by mouth daily.  Marland Kitchen omeprazole (PRILOSEC) 20 MG capsule Take 1 capsule (20 mg total) daily as needed by mouth.  Marland Kitchen PARoxetine (PAXIL) 30 MG tablet Take 1 tablet (30 mg total) daily by mouth.  . simethicone (MYLICON) 80 MG chewable tablet Chew 1 tablet (80 mg total) by mouth every 6 (six) hours as needed for flatulence.  . Triamcinolone Acetonide (NASACORT AQ NA) Place 2 Doses into the nose daily as needed.   . [DISCONTINUED] LORazepam (ATIVAN) 1 MG tablet Take 0.5 tablets (0.5 mg total) 2 (two) times daily as needed by mouth for anxiety.   No facility-administered encounter medications on file as of 01/24/2017.     Activities of Daily Living In your present state of health, do you have any difficulty performing the following activities: 01/24/2017  Hearing? N  Vision? N  Difficulty concentrating or making decisions? Y  Walking or climbing stairs? N  Dressing or bathing? N  Doing errands, shopping? N  Preparing Food and eating ? N  Using the Toilet? N  In the past six months, have you accidently leaked urine? N  Do you have problems with loss of bowel control? N  Managing your  Medications? N  Managing your Finances? N  Housekeeping or managing your Housekeeping? N  Some recent data might be hidden    Timed Get Up and Go performed: 6 seconds with no assistive devices.   Patient Care Team: [name], MD as PCP - General (Family Medicine) [name], MD as PCP - Family Medicine (Family Medicine) Forest Gleason, MD (Oncology) Oneta Rack, MD (Dermatology) Manya Silvas, MD (Gastroenterology) Bary Castilla Forest Gleason, MD (General Surgery)    Assessment:     Exercise Activities and Dietary recommendations Current Exercise Habits: The patient does not participate in regular exercise at present, Exercise limited by: None identified  Goals    .  DIET - INCREASE WATER INTAKE     Recommend drinking at least 6-8 glasses of water a day     . Quit Smoking     Smoking cessation discussed      Fall Risk Fall Risk  01/24/2017 07/13/2016 03/09/2016 01/07/2016 01/06/2015  Falls in the past year? No No No No No   Is the patient's home free of loose throw rugs in walkways, pet beds, electrical cords, etc?   yes      Grab bars in the bathroom? yes      Handrails on the stairs?   yes      Adequate lighting?   yes  Depression Screen PHQ 2/9 Scores 01/24/2017 01/07/2016 01/06/2015  PHQ - 2 Score 0 1 1     Cognitive Function     6CIT Screen 01/24/2017  What Year? 0 points  What month? 0 points  What time? 0 points  Count back from 20 0 points  Months in reverse 0 points  Repeat phrase 4 points  Total Score 4    Immunization History  Administered Date(s) Administered  . Influenza, High Dose Seasonal PF 01/04/2017  . Influenza,inj,Quad PF,6+ Mos 01/06/2015, 11/21/2015  . Pneumococcal Conjugate-13 03/06/2014  . Pneumococcal-Unspecified 02/22/1993, 02/23/2003  . Td 07/24/2003  . Tdap 11/18/2010  . Zoster 11/03/2005   Screening Tests Health Maintenance  Topic Date Due  . COLONOSCOPY  09/16/2020  . TETANUS/TDAP  11/17/2020  . INFLUENZA  VACCINE  Completed  . DEXA SCAN  Completed   Cancer Screenings: Lung: Low Dose CT Chest recommended if Age 34-80 years, 30 pack-year currently smoking OR have quit w/in 15years. Patient does qualify. Will send referral.  Breast: Up to date on Mammogram? Yes  Up to date of Bone Density/Dexa? Yes Colorectal: completed 09/17/2015  Additional Screenings:  Hepatitis B/HIV/Syphillis: Hep B done 07/04/2015 , HIV, syphillis not indicated  Hepatitis C Screening: hep C done     Plan:     I have personally reviewed and addressed the Medicare Annual Wellness questionnaire and have noted the following in the patient's chart:  A. Medical and social history B. Use of alcohol, tobacco or illicit drugs  C. Current medications and supplements D. Functional ability and status E.  Nutritional status F.  Physical activity G. Advance directives H. List of other physicians I.  Hospitalizations, surgeries, and ER visits in previous 12 months J.  [place] such as hearing and vision if needed, cognitive and depression L. Referrals and appointments   In addition, I have reviewed and discussed with patient certain preventive protocols, quality metrics, and best practice recommendations. A written personalized care plan for preventive services as well as general preventive health recommendations were provided to patient.   Signed,  Tyler Aas, LPN Nurse Health Advisor   Nurse Notes:  Patient requested lab results from 01/04/2017, discussed with [name], [place] to inform patient lab results are stable and to continue medications at same dose. Patient advised and verbalized understanding with no questions.  Patient also requested refill on ativan. States she usually gets 3 months supply and was only able to get a one month supply when she was in last month. Informed Dr.Crissman of request.

## 2017-01-25 ENCOUNTER — Telehealth: Payer: Self-pay | Admitting: *Deleted

## 2017-01-25 DIAGNOSIS — Z87891 Personal history of nicotine dependence: Secondary | ICD-10-CM

## 2017-01-25 DIAGNOSIS — Z122 Encounter for screening for malignant neoplasm of respiratory organs: Secondary | ICD-10-CM

## 2017-01-25 NOTE — Telephone Encounter (Signed)
Received referral for initial lung cancer screening scan. Contacted patient and obtained smoking history,(current, 35 pack year) as well as answering questions related to screening process. Patient denies signs of lung cancer such as weight loss or hemoptysis. Patient denies comorbidity that would prevent curative treatment if lung cancer were found. Patient is scheduled for shared decision making visit and CT scan on 02/02/17.

## 2017-01-25 NOTE — Telephone Encounter (Signed)
Received referral for low dose lung cancer screening CT scan. Message left at phone number listed in EMR for patient to call me back to facilitate scheduling scan.  

## 2017-02-02 ENCOUNTER — Ambulatory Visit
Admission: RE | Admit: 2017-02-02 | Discharge: 2017-02-02 | Disposition: A | Discharge status: Home / Self Care | Payer: Medicare HMO | Source: Ambulatory Visit | Attending: Oncology | Admitting: Oncology

## 2017-02-02 ENCOUNTER — Inpatient Hospital Stay: Payer: Medicare HMO | Attending: Oncology | Admitting: Oncology

## 2017-02-02 DIAGNOSIS — Z122 Encounter for screening for malignant neoplasm of respiratory organs: Secondary | ICD-10-CM

## 2017-02-02 DIAGNOSIS — J439 Emphysema, unspecified: Secondary | ICD-10-CM | POA: Diagnosis not present

## 2017-02-02 DIAGNOSIS — I2510 Atherosclerotic heart disease of native coronary artery without angina pectoris: Secondary | ICD-10-CM | POA: Diagnosis not present

## 2017-02-02 DIAGNOSIS — I700 Atherosclerosis of aorta: Secondary | ICD-10-CM | POA: Diagnosis not present

## 2017-02-02 DIAGNOSIS — F17210 Nicotine dependence, cigarettes, uncomplicated: Secondary | ICD-10-CM | POA: Diagnosis not present

## 2017-02-02 DIAGNOSIS — R590 Localized enlarged lymph nodes: Secondary | ICD-10-CM | POA: Insufficient documentation

## 2017-02-02 DIAGNOSIS — Z87891 Personal history of nicotine dependence: Secondary | ICD-10-CM | POA: Diagnosis not present

## 2017-02-02 DIAGNOSIS — R69 Illness, unspecified: Secondary | ICD-10-CM | POA: Diagnosis not present

## 2017-02-02 NOTE — Progress Notes (Signed)
In accordance with CMS guidelines, patient has met eligibility criteria including age, absence of signs or symptoms of lung cancer.  Social History   Tobacco Use  . Smoking status: Current Every Day Smoker    Packs/day: 1.00    Years: 35.00    Pack years: 35.00    Types: Cigarettes  . Smokeless tobacco: Never Used  Substance Use Topics  . Alcohol use: No    Alcohol/week: 0.0 oz  . Drug use: No     A shared decision-making session was conducted prior to the performance of CT scan. This includes one or more decision aids, includes benefits and harms of screening, follow-up diagnostic testing, over-diagnosis, false positive rate, and total radiation exposure.  Counseling on the importance of adherence to annual lung cancer LDCT screening, impact of co-morbidities, and ability or willingness to undergo diagnosis and treatment is imperative for compliance of the program.  Counseling on the importance of continued smoking cessation for former smokers; the importance of smoking cessation for current smokers, and information about tobacco cessation interventions have been given to patient including [place] and 1800 quit [place] programs.  Written order for lung cancer screening with LDCT has been given to the patient and any and all questions have been answered to the best of my abilities.   Yearly follow up will be coordinated by Burgess Estelle, Thoracic Navigator.  Faythe Casa, NP 02/02/2017 1:24 PM

## 2017-02-04 ENCOUNTER — Telehealth: Payer: Self-pay | Admitting: *Deleted

## 2017-02-04 NOTE — Telephone Encounter (Signed)
Notified patient of LDCT lung cancer screening program results with recommendation for 12 month follow up imaging. Also notified of incidental findings noted below and is encouraged to discuss further with PCP who will receive a copy of this note and/or the CT report. Patient verbalizes understanding. Results have also been shared with Dr. Grayland Ormond with medical oncology.    IMPRESSION: 1. Lung-RADS 2-S, benign appearance or behavior. Continue annual screening with low-dose chest CT without contrast in 12 months. 2. The "S" modifier above refers to potentially clinically significant non lung cancer related findings. Specifically, nonspecific mild right axillary and right paratracheal adenopathy. Suggestion of borderline mild enlargement of the partially visualized spleen. Recommend mammographic correlation given the asymmetric mild right axillary adenopathy. Given the reported remote history of a lymphoproliferative disorder, PET-CT may be considered for further evaluation of these findings, as clinically warranted. 3. Three-vessel coronary atherosclerosis.  Aortic Atherosclerosis (ICD10-I70.0) and Emphysema (ICD10-J43.9).

## 2017-02-04 NOTE — Telephone Encounter (Signed)
Call pt 

## 2017-02-08 MED ORDER — SULFAMETHOXAZOLE-TRIMETHOPRIM 800-160 MG PO TABS: 1.0000 | ORAL_TABLET | Freq: Two times a day (BID) | ORAL | 0 refills | Status: DC

## 2017-02-08 NOTE — Addendum Note (Signed)
Addended by: Golden Pop A on: 02/08/2017 11:47 AM   Modules accepted: Orders

## 2017-02-08 NOTE — Telephone Encounter (Signed)
Reviewed CT recommendations with patient already seeing hematology and has mammogram on a yearly basis patient otherwise st except for sinus infection will call in a Septra drug for sinuses.

## 2017-03-04 ENCOUNTER — Inpatient Hospital Stay: Payer: Medicare HMO | Attending: Oncology

## 2017-03-04 DIAGNOSIS — C9110 Chronic lymphocytic leukemia of B-cell type not having achieved remission: Secondary | ICD-10-CM | POA: Insufficient documentation

## 2017-03-04 LAB — CBC WITH DIFFERENTIAL/PLATELET
Basophils Absolute: 0 10*3/uL (ref 0–0.1)
Basophils Relative: 0 %
Eosinophils Absolute: 0.3 10*3/uL (ref 0–0.7)
Eosinophils Relative: 0 %
HCT: 43.2 % (ref 35.0–47.0)
Hemoglobin: 14.4 g/dL (ref 12.0–16.0)
Lymphocytes Relative: 94 %
Lymphs Abs: 61.2 10*3/uL — ABNORMAL HIGH (ref 1.0–3.6)
MCH: 32.8 pg (ref 26.0–34.0)
MCHC: 33.4 g/dL (ref 32.0–36.0)
MCV: 98.1 fL (ref 80.0–100.0)
Monocytes Absolute: 0.9 10*3/uL (ref 0.2–0.9)
Monocytes Relative: 1 %
Neutro Abs: 3.3 10*3/uL (ref 1.4–6.5)
Neutrophils Relative %: 5 %
Platelets: 134 10*3/uL — ABNORMAL LOW (ref 150–440)
RBC: 4.4 MIL/uL (ref 3.80–5.20)
RDW: 13.3 % (ref 11.5–14.5)
WBC: 65.8 10*3/uL (ref 3.6–11.0)

## 2017-04-11 ENCOUNTER — Encounter: Payer: Self-pay | Admitting: Family Medicine

## 2017-04-15 ENCOUNTER — Inpatient Hospital Stay: Payer: Medicare HMO | Attending: Oncology

## 2017-04-15 DIAGNOSIS — C9110 Chronic lymphocytic leukemia of B-cell type not having achieved remission: Secondary | ICD-10-CM | POA: Diagnosis not present

## 2017-04-15 LAB — CBC WITH DIFFERENTIAL/PLATELET
Basophils Absolute: 0.1 10*3/uL (ref 0–0.1)
Basophils Relative: 0 %
Eosinophils Absolute: 0.3 10*3/uL (ref 0–0.7)
Eosinophils Relative: 0 %
HCT: 44.8 % (ref 35.0–47.0)
Hemoglobin: 15 g/dL (ref 12.0–16.0)
Lymphocytes Relative: 93 %
Lymphs Abs: 73 10*3/uL — ABNORMAL HIGH (ref 1.0–3.6)
MCH: 32.9 pg (ref 26.0–34.0)
MCHC: 33.5 g/dL (ref 32.0–36.0)
MCV: 98.2 fL (ref 80.0–100.0)
Monocytes Absolute: 0.8 10*3/uL (ref 0.2–0.9)
Monocytes Relative: 1 %
Neutro Abs: 4.3 10*3/uL (ref 1.4–6.5)
Neutrophils Relative %: 6 %
Platelets: 129 10*3/uL — ABNORMAL LOW (ref 150–440)
RBC: 4.56 MIL/uL (ref 3.80–5.20)
RDW: 14.3 % (ref 11.5–14.5)
WBC: 78.5 10*3/uL (ref 3.6–11.0)

## 2017-05-25 NOTE — Progress Notes (Signed)
[facility]  Telephone:([phone]) [phone] Fax:([phone]) [phone]  ID: Katherine [name] OB: [birth year]  MR#: [id number]  PZW#:[id number]  Patient Care Team: [name], MD as PCP - General (Family Medicine) [name], MD as PCP - Family Medicine (Family Medicine) Forest Gleason, MD (Oncology) Oneta Rack, MD (Dermatology) Manya Silvas, MD (Gastroenterology) Bary Castilla Forest Gleason, MD (General Surgery)  CHIEF COMPLAINT: CLL  INTERVAL HISTORY: Patient returns to clinic today for repeat laboratory work, further evaluation, and treatment planning.  She has noted increased weakness and fatigue over the past several weeks, but otherwise feels well. She denies any fevers, weight loss, or night sweats. She has no neurologic complaints. She has no chest pain or shortness of breath. She denies any nausea, vomiting, diarrhea, or constipation. She has no urinary complaints. Patient offers no further specific complaints today.  REVIEW OF SYSTEMS:   Review of Systems  Constitutional: Positive for malaise/fatigue. Negative for fever and weight loss.  HENT: Negative.  Negative for congestion and sinus pain.   Respiratory: Negative.  Negative for cough and shortness of breath.   Cardiovascular: Negative.  Negative for chest pain and leg swelling.  Gastrointestinal: Negative.  Negative for blood in stool, constipation, diarrhea, nausea and vomiting.  Genitourinary: Negative.  Negative for frequency, hematuria and urgency.  Musculoskeletal: Negative.   Skin: Negative.  Negative for rash.  Neurological: Positive for weakness. Negative for tingling, sensory change, focal weakness and headaches.  Psychiatric/Behavioral: Negative.  The patient is not nervous/anxious and does not have insomnia.     As per HPI. Otherwise, a complete review of systems is negative.  PAST MEDICAL HISTORY: Past Medical History:  Diagnosis Date  . Allergy   . Anxiety   . Depression   . GERD  (gastroesophageal reflux disease)   . Hemorrhoids   . Leukemia, lymphoid ([place])    CLL  . Lobar pneumonia ([place])   . Osteopenia   . Personal history of chemotherapy     PAST SURGICAL HISTORY: Past Surgical History:  Procedure Laterality Date  . ABDOMINAL HYSTERECTOMY    . APPENDECTOMY    . BREAST BIOPSY Left    bx x 3-neg  . COLON SURGERY     sigmoid resection  . COLONOSCOPY     2007, 2012  . COLONOSCOPY WITH PROPOFOL N/A 09/17/2015   Procedure: COLONOSCOPY WITH PROPOFOL;  Surgeon: Robert Bellow, MD;  [facility];  Service: Endoscopy;  Laterality: N/A;  . SPINE SURGERY     L4-5    FAMILY HISTORY Family History  Problem Relation Age of Onset  . Osteoporosis Mother   . Hypertension Father   . Heart attack Father   . Stroke Maternal Grandfather   . Breast cancer Sister 29       ADVANCED DIRECTIVES:    HEALTH MAINTENANCE: Social History   Tobacco Use  . Smoking status: Current Every Day Smoker    Packs/day: 1.00    Years: 35.00    Pack years: 35.00    Types: Cigarettes  . Smokeless tobacco: Never Used  Substance Use Topics  . Alcohol use: No    Alcohol/week: 0.0 oz  . Drug use: No     Allergies  Allergen Reactions  . Augmentin [Amoxicillin-Pot Clavulanate] Swelling    tongue  . Biaxin [Clarithromycin] Swelling and Rash    tongue    Current Outpatient Medications  Medication Sig Dispense Refill  . aspirin EC 81 MG tablet Take 81 mg by mouth every other  day.     . atorvastatin (LIPITOR) 10 MG tablet Take 1 tablet (10 mg total) daily by mouth. 90 tablet 1  . benazepril (LOTENSIN) 40 MG tablet Take 1 tablet (40 mg total) daily by mouth. 90 tablet 1  . calcium-vitamin D (OSCAL WITH D) 500-200 MG-UNIT tablet Take 1 tablet by mouth daily with breakfast. Dose of drugs may change based on what is on sale at the time    . diphenhydrAMINE (BENADRYL) 25 MG tablet Take 25 mg by mouth at bedtime as needed.    . fexofenadine (ALLEGRA) 180 MG tablet  Take 180 mg by mouth daily. As needed    . LORazepam (ATIVAN) 1 MG tablet Take 0.5 tablets (0.5 mg total) by mouth 2 (two) times daily as needed for anxiety. 60 tablet 2  . Multiple Vitamins-Minerals (MULTIVITAMIN WITH MINERALS) tablet Take 1 tablet by mouth daily.    Marland Kitchen omeprazole (PRILOSEC) 20 MG capsule Take 1 capsule (20 mg total) daily as needed by mouth. 90 capsule 4  . PARoxetine (PAXIL) 30 MG tablet Take 1 tablet (30 mg total) daily by mouth. 90 tablet 1  . simethicone (MYLICON) 80 MG chewable tablet Chew 1 tablet (80 mg total) by mouth every 6 (six) hours as needed for flatulence. 30 tablet 0  . Triamcinolone Acetonide (NASACORT AQ NA) Place 2 Doses into the nose daily as needed.     . sulfamethoxazole-trimethoprim (BACTRIM DS,SEPTRA DS) 800-160 MG tablet Take 1 tablet by mouth 2 (two) times daily. (Patient not taking: Reported on 05/27/2017) 20 tablet 0   No current facility-administered medications for this visit.     OBJECTIVE: Vitals:   05/27/17 1131  BP: 114/72  Pulse: 86  Resp: 18  Temp: 97.6 F (36.4 C)     Body mass index is 24.64 kg/m.    ECOG FS:0 - Asymptomatic  General: Well-developed, well-nourished, no acute distress. Eyes: anicteric sclera. Lungs: Bilateral inspiratory wheeze, diminished throughout. Heart: Regular rate and rhythm. No rubs, murmurs, or gallops. Musculoskeletal: No edema, cyanosis, or clubbing. Neuro: Alert, answering all questions appropriately. Cranial nerves grossly intact. Skin: No rashes or petechiae noted. Psych: Normal affect.   LAB RESULTS:  Lab Results  Component Value Date   NA 147 (H) 01/04/2017   K 4.4 01/04/2017   CL 104 01/04/2017   CO2 27 01/04/2017   GLUCOSE 96 01/04/2017   BUN 10 01/04/2017   CREATININE 0.86 01/04/2017   CALCIUM 10.4 (H) 01/04/2017   PROT 6.4 01/04/2017   ALBUMIN 4.9 (H) 01/04/2017   AST 18 01/04/2017   ALT 8 01/04/2017   ALKPHOS 93 01/04/2017   BILITOT 0.5 01/04/2017   GFRNONAA 66 01/04/2017    GFRAA 76 01/04/2017    Lab Results  Component Value Date   WBC 93.4 (HH) 05/27/2017   NEUTROABS 4.0 05/27/2017   HGB 15.0 05/27/2017   HCT 45.8 05/27/2017   MCV 100.4 (H) 05/27/2017   PLT 165 05/27/2017     STUDIES: No results found.  ASSESSMENT: CLL, Rai stage 0  PLAN:    1. CLL: Patient completed 4 cycles of weekly Rituxan treatments in May 2017.  She now has a doubling time of less than 6 months with her white blood cells increasing to greater than 90.  She is also symptomatic with worsening weakness and fatigue.  Because of this, will proceed with weekly Rituxan x4.  Patient's husband has a procedure in 1 week, therefore she will return to clinic in 2 weeks to initiate cycle  1 of 4.   2. Thrombocytopenia: Resolved. Monitor. 3. Tobacco abuse: CT lung cancer screening on February 02, 2018 reported a Lungs-RADS 2-S.  Approximately 20 minutes was spent in discussion of which greater than 50% was consultation.   Patient expressed understanding and was in agreement with this plan. She also understands that She can call clinic at any time with any questions, concerns, or complaints.    Lloyd Huger, MD 05/27/17 2:50 PM

## 2017-05-27 ENCOUNTER — Inpatient Hospital Stay: Payer: Medicare HMO

## 2017-05-27 ENCOUNTER — Inpatient Hospital Stay: Payer: Medicare HMO | Attending: Oncology | Admitting: Oncology

## 2017-05-27 VITALS — BP 114/72 | HR 86 | Temp 97.6°F | Resp 18 | Wt 157.3 lb

## 2017-05-27 DIAGNOSIS — R531 Weakness: Secondary | ICD-10-CM

## 2017-05-27 DIAGNOSIS — R69 Illness, unspecified: Secondary | ICD-10-CM | POA: Diagnosis not present

## 2017-05-27 DIAGNOSIS — C9110 Chronic lymphocytic leukemia of B-cell type not having achieved remission: Secondary | ICD-10-CM | POA: Insufficient documentation

## 2017-05-27 DIAGNOSIS — F17210 Nicotine dependence, cigarettes, uncomplicated: Secondary | ICD-10-CM | POA: Diagnosis not present

## 2017-05-27 DIAGNOSIS — D696 Thrombocytopenia, unspecified: Secondary | ICD-10-CM | POA: Insufficient documentation

## 2017-05-27 DIAGNOSIS — R5383 Other fatigue: Secondary | ICD-10-CM | POA: Insufficient documentation

## 2017-05-27 DIAGNOSIS — Z5112 Encounter for antineoplastic immunotherapy: Secondary | ICD-10-CM | POA: Diagnosis present

## 2017-05-27 DIAGNOSIS — R5381 Other malaise: Secondary | ICD-10-CM | POA: Diagnosis not present

## 2017-05-27 LAB — CBC WITH DIFFERENTIAL/PLATELET
Basophils Absolute: 0.2 10*3/uL — ABNORMAL HIGH (ref 0–0.1)
Basophils Relative: 0 %
Eosinophils Absolute: 0.2 10*3/uL (ref 0–0.7)
Eosinophils Relative: 0 %
HCT: 45.8 % (ref 35.0–47.0)
Hemoglobin: 15 g/dL (ref 12.0–16.0)
Lymphocytes Relative: 95 %
Lymphs Abs: 87.7 10*3/uL — ABNORMAL HIGH (ref 1.0–3.6)
MCH: 32.8 pg (ref 26.0–34.0)
MCHC: 32.6 g/dL (ref 32.0–36.0)
MCV: 100.4 fL — ABNORMAL HIGH (ref 80.0–100.0)
Monocytes Absolute: 1.3 10*3/uL — ABNORMAL HIGH (ref 0.2–0.9)
Monocytes Relative: 1 %
Neutro Abs: 4 10*3/uL (ref 1.4–6.5)
Neutrophils Relative %: 4 %
Platelets: 165 10*3/uL (ref 150–440)
RBC: 4.56 MIL/uL (ref 3.80–5.20)
RDW: 13.8 % (ref 11.5–14.5)
WBC: 93.4 10*3/uL (ref 3.6–11.0)

## 2017-06-03 ENCOUNTER — Other Ambulatory Visit: Payer: Medicare HMO

## 2017-06-03 ENCOUNTER — Ambulatory Visit: Payer: Medicare HMO | Admitting: Oncology

## 2017-06-06 NOTE — Progress Notes (Signed)
[place]  Telephone:(336) [phone] Fax:(336) [phone]  ID: Rheta Hemmelgarn OB: 18-May-1940  MR#: [id number]  UYQ#:[id number]  Patient Care Team: [name], MD as PCP - General (Family Medicine) [name], MD as PCP - Family Medicine (Family Medicine) Forest Gleason, MD (Oncology) Oneta Rack, MD (Dermatology) Manya Silvas, MD (Gastroenterology) Bary Castilla Forest Gleason, MD (General Surgery)  CHIEF COMPLAINT: CLL  INTERVAL HISTORY: Patient returns to clinic today for further evaluation and to initiate cycle 1 of 4 of weekly Rituxan.  She is anxious, but otherwise feels well.  She continues to have weakness and fatigue. She denies any fevers, weight loss, or night sweats. She has no neurologic complaints. She has no chest pain or shortness of breath. She denies any nausea, vomiting, diarrhea, or constipation. She has no urinary complaints. Patient offers no further specific complaints today.  REVIEW OF SYSTEMS:   Review of Systems  Constitutional: Positive for malaise/fatigue. Negative for fever and weight loss.  HENT: Negative.  Negative for congestion and sinus pain.   Respiratory: Negative.  Negative for cough and shortness of breath.   Cardiovascular: Negative.  Negative for chest pain and leg swelling.  Gastrointestinal: Negative.  Negative for blood in stool, constipation, diarrhea, nausea and vomiting.  Genitourinary: Negative.  Negative for frequency, hematuria and urgency.  Musculoskeletal: Negative.   Skin: Negative.  Negative for rash.  Neurological: Positive for weakness. Negative for tingling, sensory change, focal weakness and headaches.  Psychiatric/Behavioral: The patient is nervous/anxious. The patient does not have insomnia.     As per HPI. Otherwise, a complete review of systems is negative.  PAST MEDICAL HISTORY: Past Medical History:  Diagnosis Date  . Allergy   . Anxiety   . Depression   . GERD (gastroesophageal  reflux disease)   . Hemorrhoids   . Leukemia, lymphoid ([place])    CLL  . Lobar pneumonia ([place])   . Osteopenia   . Personal history of chemotherapy     PAST SURGICAL HISTORY: Past Surgical History:  Procedure Laterality Date  . ABDOMINAL HYSTERECTOMY    . APPENDECTOMY    . BREAST BIOPSY Left    bx x 3-neg  . COLON SURGERY     sigmoid resection  . COLONOSCOPY     2007, 2012  . COLONOSCOPY WITH PROPOFOL N/A 09/17/2015   Procedure: COLONOSCOPY WITH PROPOFOL;  Surgeon: Robert Bellow, MD;  Location: [facility] ENDOSCOPY;  Service: Endoscopy;  Laterality: N/A;  . SPINE SURGERY     L4-5    FAMILY HISTORY Family History  Problem Relation Age of Onset  . Osteoporosis Mother   . Hypertension Father   . Heart attack Father   . Stroke Maternal Grandfather   . Breast cancer Sister 76       ADVANCED DIRECTIVES:    HEALTH MAINTENANCE: Social History   Tobacco Use  . Smoking status: Current Every Day Smoker    Packs/day: 1.00    Years: 35.00    Pack years: 35.00    Types: Cigarettes  . Smokeless tobacco: Never Used  Substance Use Topics  . Alcohol use: No    Alcohol/week: 0.0 oz  . Drug use: No     Allergies  Allergen Reactions  . Augmentin [Amoxicillin-Pot Clavulanate] Swelling    tongue  . Biaxin [Clarithromycin] Swelling and Rash    tongue    Current Outpatient Medications  Medication Sig Dispense Refill  . aspirin EC 81 MG tablet Take 81 mg by mouth every  other day.     Marland Kitchen atorvastatin (LIPITOR) 10 MG tablet Take 1 tablet (10 mg total) daily by mouth. 90 tablet 1  . benazepril (LOTENSIN) 40 MG tablet Take 1 tablet (40 mg total) daily by mouth. 90 tablet 1  . calcium-vitamin D (OSCAL WITH D) 500-200 MG-UNIT tablet Take 1 tablet by mouth daily with breakfast. Dose of drugs may change based on what is on sale at the time    . diphenhydrAMINE (BENADRYL) 25 MG tablet Take 25 mg by mouth at bedtime as needed.    . fexofenadine (ALLEGRA) 180 MG tablet Take 180 mg by  mouth daily. As needed    . LORazepam (ATIVAN) 1 MG tablet Take 0.5 tablets (0.5 mg total) by mouth 2 (two) times daily as needed for anxiety. 60 tablet 2  . Multiple Vitamins-Minerals (MULTIVITAMIN WITH MINERALS) tablet Take 1 tablet by mouth daily.    Marland Kitchen omeprazole (PRILOSEC) 20 MG capsule Take 1 capsule (20 mg total) daily as needed by mouth. 90 capsule 4  . PARoxetine (PAXIL) 30 MG tablet Take 1 tablet (30 mg total) daily by mouth. 90 tablet 1  . simethicone (MYLICON) 80 MG chewable tablet Chew 1 tablet (80 mg total) by mouth every 6 (six) hours as needed for flatulence. 30 tablet 0  . Triamcinolone Acetonide (NASACORT AQ NA) Place 2 Doses into the nose daily as needed.      No current facility-administered medications for this visit.     OBJECTIVE: Vitals:   06/10/17 0926  BP: (!) 143/66  Pulse: 76  Resp: 20  Temp: 97.9 F (36.6 C)     Body mass index is 24.52 kg/m.    ECOG FS:0 - Asymptomatic  General: Well-developed, well-nourished, no acute distress. Eyes: Pink conjunctiva, anicteric sclera. Lungs: Clear to auscultation bilaterally. Heart: Regular rate and rhythm. No rubs, murmurs, or gallops. Abdomen: Soft, nontender, nondistended. No organomegaly noted, normoactive bowel sounds. Musculoskeletal: No edema, cyanosis, or clubbing. Neuro: Alert, answering all questions appropriately. Cranial nerves grossly intact. Skin: No rashes or petechiae noted. Psych: Normal affect. Lymphatics: No cervical, calvicular, axillary or inguinal LAD.   LAB RESULTS:  Lab Results  Component Value Date   NA 147 (H) 01/04/2017   K 4.4 01/04/2017   CL 104 01/04/2017   CO2 27 01/04/2017   GLUCOSE 96 01/04/2017   BUN 10 01/04/2017   CREATININE 0.86 01/04/2017   CALCIUM 10.4 (H) 01/04/2017   PROT 6.4 01/04/2017   ALBUMIN 4.9 (H) 01/04/2017   AST 18 01/04/2017   ALT 8 01/04/2017   ALKPHOS 93 01/04/2017   BILITOT 0.5 01/04/2017   GFRNONAA 66 01/04/2017   GFRAA 76 01/04/2017    Lab  Results  Component Value Date   WBC 101.7 (HH) 06/10/2017   NEUTROABS 3.5 06/10/2017   HGB 14.2 06/10/2017   HCT 43.8 06/10/2017   MCV 100.3 (H) 06/10/2017   PLT 129 (L) 06/10/2017     STUDIES: No results found.  ASSESSMENT: CLL, Rai stage 0  PLAN:    1. CLL: Patient completed 4 cycles of weekly Rituxan treatments in May 2017.  She now has a doubling time of less than 6 months with her white blood cells increasing to greater than 100.  She is also symptomatic with worsening weakness and fatigue.  Proceed with cycle 1 of 4 of weekly Rituxan.  Return to clinic in 1 week for further evaluation and consideration of cycle 2.   2. Thrombocytopenia: Platelet count slightly decreased to 129.  Rituxan as above.   3. Tobacco abuse: CT lung cancer screening on February 02, 2018 reported a Lungs-RADS 2-S.  Repeat in 1 year.  Approximately 30 minutes was spent in discussion of which greater than 50% consultation.   Patient expressed understanding and was in agreement with this plan. She also understands that She can call clinic at any time with any questions, concerns, or complaints.    Lloyd Huger, MD 06/10/17 1:16 PM

## 2017-06-10 ENCOUNTER — Inpatient Hospital Stay: Payer: Medicare HMO

## 2017-06-10 ENCOUNTER — Inpatient Hospital Stay (HOSPITAL_BASED_OUTPATIENT_CLINIC_OR_DEPARTMENT_OTHER): Payer: Medicare HMO | Admitting: Oncology

## 2017-06-10 ENCOUNTER — Other Ambulatory Visit: Payer: Self-pay

## 2017-06-10 ENCOUNTER — Encounter: Payer: Self-pay | Admitting: Oncology

## 2017-06-10 VITALS — BP 143/66 | HR 76 | Temp 97.9°F | Resp 20 | Ht 67.0 in | Wt 156.5 lb

## 2017-06-10 VITALS — BP 117/69 | HR 94 | Temp 95.7°F | Resp 18

## 2017-06-10 DIAGNOSIS — C9110 Chronic lymphocytic leukemia of B-cell type not having achieved remission: Secondary | ICD-10-CM

## 2017-06-10 DIAGNOSIS — R5383 Other fatigue: Secondary | ICD-10-CM

## 2017-06-10 DIAGNOSIS — R69 Illness, unspecified: Secondary | ICD-10-CM | POA: Diagnosis not present

## 2017-06-10 DIAGNOSIS — R531 Weakness: Secondary | ICD-10-CM | POA: Diagnosis not present

## 2017-06-10 DIAGNOSIS — Z5112 Encounter for antineoplastic immunotherapy: Secondary | ICD-10-CM | POA: Diagnosis not present

## 2017-06-10 DIAGNOSIS — R450 Nervousness: Secondary | ICD-10-CM

## 2017-06-10 DIAGNOSIS — F17210 Nicotine dependence, cigarettes, uncomplicated: Secondary | ICD-10-CM

## 2017-06-10 DIAGNOSIS — D696 Thrombocytopenia, unspecified: Secondary | ICD-10-CM | POA: Diagnosis not present

## 2017-06-10 LAB — CBC WITH DIFFERENTIAL/PLATELET
Basophils Absolute: 0.1 10*3/uL (ref 0–0.1)
Basophils Relative: 0 %
Eosinophils Absolute: 0.4 10*3/uL (ref 0–0.7)
Eosinophils Relative: 0 %
HCT: 43.8 % (ref 35.0–47.0)
Hemoglobin: 14.2 g/dL (ref 12.0–16.0)
Lymphocytes Relative: 95 %
Lymphs Abs: 96.1 10*3/uL — ABNORMAL HIGH (ref 1.0–3.6)
MCH: 32.5 pg (ref 26.0–34.0)
MCHC: 32.4 g/dL (ref 32.0–36.0)
MCV: 100.3 fL — ABNORMAL HIGH (ref 80.0–100.0)
Monocytes Absolute: 1.6 10*3/uL — ABNORMAL HIGH (ref 0.2–0.9)
Monocytes Relative: 2 %
Neutro Abs: 3.5 10*3/uL (ref 1.4–6.5)
Neutrophils Relative %: 3 %
Platelets: 129 10*3/uL — ABNORMAL LOW (ref 150–440)
RBC: 4.37 MIL/uL (ref 3.80–5.20)
RDW: 13.2 % (ref 11.5–14.5)
WBC: 101.7 10*3/uL (ref 3.6–11.0)

## 2017-06-10 MED ORDER — SODIUM CHLORIDE 0.9 % IV SOLN
375.0000 mg/m2 | Freq: Once | INTRAVENOUS | Status: AC
  Administered 2017-06-10: 700 mg via INTRAVENOUS
  Filled 2017-06-10 (×2): qty 70

## 2017-06-10 MED ORDER — ACETAMINOPHEN 325 MG PO TABS
650.0000 mg | ORAL_TABLET | Freq: Once | ORAL | Status: AC
  Administered 2017-06-10: 650 mg via ORAL
  Filled 2017-06-10: qty 2

## 2017-06-10 MED ORDER — DIPHENHYDRAMINE HCL 25 MG PO CAPS
25.0000 mg | ORAL_CAPSULE | Freq: Once | ORAL | Status: AC
  Administered 2017-06-10: 25 mg via ORAL
  Filled 2017-06-10: qty 1

## 2017-06-10 MED ORDER — SODIUM CHLORIDE 0.9 % IV SOLN
Freq: Once | INTRAVENOUS | Status: AC
  Administered 2017-06-10: 10:00:00 via INTRAVENOUS
  Filled 2017-06-10: qty 1000

## 2017-06-10 NOTE — Patient Instructions (Signed)

## 2017-06-10 NOTE — Progress Notes (Signed)
Received call from lab with Critical results WBC 101.7. MD was notified at 9:30 og critical lab.  Patient is here today to initiate treatment with Rituxan.

## 2017-06-13 NOTE — Progress Notes (Signed)
[place]  Telephone:(336) [phone] Fax:(336) [phone]  ID: Ritaj Dullea OB: 1940/05/30  MR#: [id number]  TKZ#:[id number]  Patient Care Team: [name], MD as PCP - General (Family Medicine) [name], MD as PCP - Family Medicine (Family Medicine) Forest Gleason, MD (Oncology) Oneta Rack, MD (Dermatology) Manya Silvas, MD (Gastroenterology) Bary Castilla Forest Gleason, MD (General Surgery)  CHIEF COMPLAINT: CLL  INTERVAL HISTORY: Patient returns to clinic today for further evaluation and consideration of cycle 2 of 4 of weekly Rituxan.  She has noticed increased weakness and fatigue this week after her infusion, but otherwise is tolerating them well. She denies any fevers, weight loss, or night sweats. She has no neurologic complaints. She has no chest pain or shortness of breath. She denies any nausea, vomiting, diarrhea, or constipation. She has no urinary complaints.  Patient otherwise feels well and offers no further specific complaints.  REVIEW OF SYSTEMS:   Review of Systems  Constitutional: Positive for malaise/fatigue. Negative for fever and weight loss.  HENT: Negative.  Negative for congestion and sinus pain.   Respiratory: Negative.  Negative for cough and shortness of breath.   Cardiovascular: Negative.  Negative for chest pain and leg swelling.  Gastrointestinal: Negative.  Negative for blood in stool, constipation, diarrhea, nausea and vomiting.  Genitourinary: Negative.  Negative for frequency, hematuria and urgency.  Musculoskeletal: Negative.   Skin: Negative.  Negative for rash.  Neurological: Positive for weakness. Negative for tingling, sensory change, focal weakness and headaches.  Psychiatric/Behavioral: The patient is nervous/anxious. The patient does not have insomnia.     As per HPI. Otherwise, a complete review of systems is negative.  PAST MEDICAL HISTORY: Past Medical History:  Diagnosis Date  . Allergy   .  Anxiety   . Depression   . GERD (gastroesophageal reflux disease)   . Hemorrhoids   . Leukemia, lymphoid ([place])    CLL  . Lobar pneumonia ([place])   . Osteopenia   . Personal history of chemotherapy     PAST SURGICAL HISTORY: Past Surgical History:  Procedure Laterality Date  . ABDOMINAL HYSTERECTOMY    . APPENDECTOMY    . BREAST BIOPSY Left    bx x 3-neg  . COLON SURGERY     sigmoid resection  . COLONOSCOPY     2007, 2012  . COLONOSCOPY WITH PROPOFOL N/A 09/17/2015   Procedure: COLONOSCOPY WITH PROPOFOL;  Surgeon: Robert Bellow, MD;  Location: [facility] ENDOSCOPY;  Service: Endoscopy;  Laterality: N/A;  . SPINE SURGERY     L4-5    FAMILY HISTORY Family History  Problem Relation Age of Onset  . Osteoporosis Mother   . Hypertension Father   . Heart attack Father   . Stroke Maternal Grandfather   . Breast cancer Sister 66       ADVANCED DIRECTIVES:    HEALTH MAINTENANCE: Social History   Tobacco Use  . Smoking status: Current Every Day Smoker    Packs/day: 1.00    Years: 35.00    Pack years: 35.00    Types: Cigarettes  . Smokeless tobacco: Never Used  Substance Use Topics  . Alcohol use: No    Alcohol/week: 0.0 oz  . Drug use: No     Allergies  Allergen Reactions  . Augmentin [Amoxicillin-Pot Clavulanate] Swelling    tongue  . Biaxin [Clarithromycin] Swelling and Rash    tongue    Current Outpatient Medications  Medication Sig Dispense Refill  . aspirin EC 81 MG  tablet Take 81 mg by mouth every other day.     Marland Kitchen atorvastatin (LIPITOR) 10 MG tablet Take 1 tablet (10 mg total) daily by mouth. 90 tablet 1  . benazepril (LOTENSIN) 40 MG tablet Take 1 tablet (40 mg total) daily by mouth. 90 tablet 1  . calcium-vitamin D (OSCAL WITH D) 500-200 MG-UNIT tablet Take 1 tablet by mouth daily with breakfast. Dose of drugs may change based on what is on sale at the time    . diphenhydrAMINE (BENADRYL) 25 MG tablet Take 25 mg by mouth at bedtime as needed.    .  fexofenadine (ALLEGRA) 180 MG tablet Take 180 mg by mouth daily. As needed    . LORazepam (ATIVAN) 1 MG tablet Take 0.5 tablets (0.5 mg total) by mouth 2 (two) times daily as needed for anxiety. 60 tablet 2  . Multiple Vitamins-Minerals (MULTIVITAMIN WITH MINERALS) tablet Take 1 tablet by mouth daily.    Marland Kitchen omeprazole (PRILOSEC) 20 MG capsule Take 1 capsule (20 mg total) daily as needed by mouth. 90 capsule 4  . PARoxetine (PAXIL) 30 MG tablet Take 1 tablet (30 mg total) daily by mouth. 90 tablet 1  . simethicone (MYLICON) 80 MG chewable tablet Chew 1 tablet (80 mg total) by mouth every 6 (six) hours as needed for flatulence. 30 tablet 0  . Triamcinolone Acetonide (NASACORT AQ NA) Place 2 Doses into the nose daily as needed.      No current facility-administered medications for this visit.     OBJECTIVE: Vitals:   06/17/17 0902  BP: 125/77  Pulse: 94  Resp: 20  Temp: 98 F (36.7 C)     Body mass index is 24.58 kg/m.    ECOG FS:0 - Asymptomatic  General: Well-developed, well-nourished, no acute distress. Eyes: Pink conjunctiva, anicteric sclera. Lungs: Clear to auscultation bilaterally. Heart: Regular rate and rhythm. No rubs, murmurs, or gallops. Abdomen: Soft, nontender, nondistended. No organomegaly noted, normoactive bowel sounds. Musculoskeletal: No edema, cyanosis, or clubbing. Neuro: Alert, answering all questions appropriately. Cranial nerves grossly intact. Skin: No rashes or petechiae noted. Psych: Normal affect. Lymphatics: No cervical, calvicular, axillary or inguinal LAD.  LAB RESULTS:  Lab Results  Component Value Date   NA 147 (H) 01/04/2017   K 4.4 01/04/2017   CL 104 01/04/2017   CO2 27 01/04/2017   GLUCOSE 96 01/04/2017   BUN 10 01/04/2017   CREATININE 0.86 01/04/2017   CALCIUM 10.4 (H) 01/04/2017   PROT 6.4 01/04/2017   ALBUMIN 4.9 (H) 01/04/2017   AST 18 01/04/2017   ALT 8 01/04/2017   ALKPHOS 93 01/04/2017   BILITOT 0.5 01/04/2017   GFRNONAA 66  01/04/2017   GFRAA 76 01/04/2017    Lab Results  Component Value Date   WBC 92.6 (HH) 06/17/2017   NEUTROABS 3.9 06/17/2017   HGB 14.7 06/17/2017   HCT 44.4 06/17/2017   MCV 99.2 06/17/2017   PLT 138 (L) 06/17/2017     STUDIES: No results found.  ASSESSMENT: CLL, Rai stage 0  PLAN:    1. CLL: Patient completed 4 cycles of weekly Rituxan treatments in May 2017.  She now has a doubling time of less than 6 months with her white blood cells increasing to greater than 100.  She is also symptomatic with worsening weakness and fatigue. Proceed with cycle 2 of 4 of weekly Rituxan.  Return to clinic in 1 week for further evaluation and consideration of cycle 3.  2. Thrombocytopenia: Improving.  Continue Rituxan as  above. 3. Tobacco abuse: CT lung cancer screening on February 02, 2018 reported a Lungs-RADS 2-S.  Repeat in 1 year. 4.  Weakness and fatigue: Possibly secondary to Rituxan, monitor.  Approximately 30 minutes was spent in discussion of which greater than 50% was consultation.   Patient expressed understanding and was in agreement with this plan. She also understands that She can call clinic at any time with any questions, concerns, or complaints.    Lloyd Huger, MD 06/18/17 5:39 PM

## 2017-06-17 ENCOUNTER — Encounter: Payer: Self-pay | Admitting: Oncology

## 2017-06-17 ENCOUNTER — Inpatient Hospital Stay (HOSPITAL_BASED_OUTPATIENT_CLINIC_OR_DEPARTMENT_OTHER): Payer: Medicare HMO | Admitting: Oncology

## 2017-06-17 ENCOUNTER — Inpatient Hospital Stay: Payer: Medicare HMO

## 2017-06-17 VITALS — BP 125/77 | HR 94 | Temp 98.0°F | Resp 20 | Wt 157.0 lb

## 2017-06-17 VITALS — BP 127/72 | HR 75 | Temp 98.3°F | Resp 20

## 2017-06-17 DIAGNOSIS — D696 Thrombocytopenia, unspecified: Secondary | ICD-10-CM

## 2017-06-17 DIAGNOSIS — R5383 Other fatigue: Secondary | ICD-10-CM

## 2017-06-17 DIAGNOSIS — C9110 Chronic lymphocytic leukemia of B-cell type not having achieved remission: Secondary | ICD-10-CM

## 2017-06-17 DIAGNOSIS — R69 Illness, unspecified: Secondary | ICD-10-CM | POA: Diagnosis not present

## 2017-06-17 DIAGNOSIS — F17210 Nicotine dependence, cigarettes, uncomplicated: Secondary | ICD-10-CM

## 2017-06-17 DIAGNOSIS — Z5112 Encounter for antineoplastic immunotherapy: Secondary | ICD-10-CM | POA: Diagnosis not present

## 2017-06-17 DIAGNOSIS — R531 Weakness: Secondary | ICD-10-CM

## 2017-06-17 DIAGNOSIS — R450 Nervousness: Secondary | ICD-10-CM | POA: Diagnosis not present

## 2017-06-17 LAB — CBC WITH DIFFERENTIAL/PLATELET
Basophils Absolute: 0.1 10*3/uL (ref 0–0.1)
Basophils Relative: 0 %
Eosinophils Absolute: 0.4 10*3/uL (ref 0–0.7)
Eosinophils Relative: 0 %
HCT: 44.4 % (ref 35.0–47.0)
Hemoglobin: 14.7 g/dL (ref 12.0–16.0)
Lymphocytes Relative: 94 %
Lymphs Abs: 86.7 10*3/uL — ABNORMAL HIGH (ref 1.0–3.6)
MCH: 32.8 pg (ref 26.0–34.0)
MCHC: 33.1 g/dL (ref 32.0–36.0)
MCV: 99.2 fL (ref 80.0–100.0)
Monocytes Absolute: 1.4 10*3/uL — ABNORMAL HIGH (ref 0.2–0.9)
Monocytes Relative: 2 %
Neutro Abs: 3.9 10*3/uL (ref 1.4–6.5)
Neutrophils Relative %: 4 %
Platelets: 138 10*3/uL — ABNORMAL LOW (ref 150–440)
RBC: 4.48 MIL/uL (ref 3.80–5.20)
RDW: 13.5 % (ref 11.5–14.5)
WBC: 92.6 10*3/uL (ref 3.6–11.0)

## 2017-06-17 MED ORDER — SODIUM CHLORIDE 0.9 % IV SOLN: 375.0000 mg/m2 | Freq: Once | INTRAVENOUS | Status: DC

## 2017-06-17 MED ORDER — SODIUM CHLORIDE 0.9 % IV SOLN
375.0000 mg/m2 | Freq: Once | INTRAVENOUS | Status: AC
  Administered 2017-06-17: 700 mg via INTRAVENOUS
  Filled 2017-06-17 (×3): qty 70

## 2017-06-17 MED ORDER — DIPHENHYDRAMINE HCL 25 MG PO CAPS
25.0000 mg | ORAL_CAPSULE | Freq: Once | ORAL | Status: AC
  Administered 2017-06-17: 25 mg via ORAL
  Filled 2017-06-17: qty 1

## 2017-06-17 MED ORDER — ACETAMINOPHEN 325 MG PO TABS
650.0000 mg | ORAL_TABLET | Freq: Once | ORAL | Status: AC
  Administered 2017-06-17: 650 mg via ORAL
  Filled 2017-06-17: qty 2

## 2017-06-17 MED ORDER — SODIUM CHLORIDE 0.9 % IV SOLN
Freq: Once | INTRAVENOUS | Status: AC
  Administered 2017-06-17: 10:00:00 via INTRAVENOUS
  Filled 2017-06-17: qty 1000

## 2017-06-17 NOTE — Progress Notes (Signed)
Patient reports fatigue today, denies any other concerns.

## 2017-06-17 NOTE — Patient Instructions (Signed)

## 2017-06-19 NOTE — Progress Notes (Signed)
[place]  Telephone:(336) [phone] Fax:(336) [phone]  ID: Katherine [name] OB: 04-03-1940  MR#: [id number]  QMV#:[id number]  Patient Care Team: [name], MD as PCP - General (Family Medicine) [name], MD as PCP - Family Medicine (Family Medicine) Forest Gleason, MD (Oncology) Oneta Rack, MD (Dermatology) Manya Silvas, MD (Gastroenterology) Bary Castilla Forest Gleason, MD (General Surgery)  CHIEF COMPLAINT: CLL  INTERVAL HISTORY: Patient returns to clinic today for further evaluation and consideration of cycle 3 of 4 of weekly Rituxan.  Her weakness and fatigue have significantly improved and she feels nearly back to her baseline.  She currently feels well and is asymptomatic. She denies any fevers, weight loss, or night sweats. She has no neurologic complaints. She has no chest pain or shortness of breath. She denies any nausea, vomiting, diarrhea, or constipation. She has no urinary complaints.  Patient offers no specific complaints today.  REVIEW OF SYSTEMS:   Review of Systems  Constitutional: Negative.  Negative for fever, malaise/fatigue and weight loss.  HENT: Negative.  Negative for congestion and sinus pain.   Respiratory: Negative.  Negative for cough and shortness of breath.   Cardiovascular: Negative.  Negative for chest pain and leg swelling.  Gastrointestinal: Negative.  Negative for blood in stool, constipation, diarrhea, nausea and vomiting.  Genitourinary: Negative.  Negative for frequency, hematuria and urgency.  Musculoskeletal: Negative.   Skin: Negative.  Negative for rash.  Neurological: Negative.  Negative for tingling, sensory change, focal weakness, weakness and headaches.  Psychiatric/Behavioral: Negative.  The patient is not nervous/anxious and does not have insomnia.     As per HPI. Otherwise, a complete review of systems is negative.  PAST MEDICAL HISTORY: Past Medical History:  Diagnosis Date  . Allergy   .  Anxiety   . Depression   . GERD (gastroesophageal reflux disease)   . Hemorrhoids   . Leukemia, lymphoid ([place])    CLL  . Lobar pneumonia ([place])   . Osteopenia   . Personal history of chemotherapy     PAST SURGICAL HISTORY: Past Surgical History:  Procedure Laterality Date  . ABDOMINAL HYSTERECTOMY    . APPENDECTOMY    . BREAST BIOPSY Left    bx x 3-neg  . COLON SURGERY     sigmoid resection  . COLONOSCOPY     2007, 2012  . COLONOSCOPY WITH PROPOFOL N/A 09/17/2015   Procedure: COLONOSCOPY WITH PROPOFOL;  Surgeon: Robert Bellow, MD;  Location[facility] ENDOSCOPY;  Service: Endoscopy;  Laterality: N/A;  . SPINE SURGERY     L4-5    FAMILY HISTORY Family History  Problem Relation Age of Onset  . Osteoporosis Mother   . Hypertension Father   . Heart attack Father   . Stroke Maternal Grandfather   . Breast cancer Sister 56       ADVANCED DIRECTIVES:    HEALTH MAINTENANCE: Social History   Tobacco Use  . Smoking status: Current Every Day Smoker    Packs/day: 1.00    Years: 35.00    Pack years: 35.00    Types: Cigarettes  . Smokeless tobacco: Never Used  Substance Use Topics  . Alcohol use: No    Alcohol/week: 0.0 oz  . Drug use: No     Allergies  Allergen Reactions  . Augmentin [Amoxicillin-Pot Clavulanate] Swelling    tongue  . Biaxin [Clarithromycin] Swelling and Rash    tongue    Current Outpatient Medications  Medication Sig Dispense Refill  . aspirin  EC 81 MG tablet Take 81 mg by mouth every other day.     Marland Kitchen atorvastatin (LIPITOR) 10 MG tablet Take 1 tablet (10 mg total) daily by mouth. 90 tablet 1  . benazepril (LOTENSIN) 40 MG tablet Take 1 tablet (40 mg total) daily by mouth. 90 tablet 1  . calcium-vitamin D (OSCAL WITH D) 500-200 MG-UNIT tablet Take 1 tablet by mouth daily with breakfast. Dose of drugs may change based on what is on sale at the time    . diphenhydrAMINE (BENADRYL) 25 MG tablet Take 25 mg by mouth at bedtime as needed.    .  fexofenadine (ALLEGRA) 180 MG tablet Take 180 mg by mouth daily. As needed    . LORazepam (ATIVAN) 1 MG tablet Take 0.5 tablets (0.5 mg total) by mouth 2 (two) times daily as needed for anxiety. 60 tablet 2  . Multiple Vitamins-Minerals (MULTIVITAMIN WITH MINERALS) tablet Take 1 tablet by mouth daily.    Marland Kitchen omeprazole (PRILOSEC) 20 MG capsule Take 1 capsule (20 mg total) daily as needed by mouth. 90 capsule 4  . PARoxetine (PAXIL) 30 MG tablet Take 1 tablet (30 mg total) daily by mouth. 90 tablet 1  . simethicone (MYLICON) 80 MG chewable tablet Chew 1 tablet (80 mg total) by mouth every 6 (six) hours as needed for flatulence. 30 tablet 0  . Triamcinolone Acetonide (NASACORT AQ NA) Place 2 Doses into the nose daily as needed.      No current facility-administered medications for this visit.     OBJECTIVE: Vitals:   06/24/17 0903  BP: (!) 144/83  Pulse: 90  Resp: 20  Temp: (!) 97.3 F (36.3 C)     Body mass index is 24.72 kg/m.    ECOG FS:0 - Asymptomatic  General: Well-developed, well-nourished, no acute distress. Eyes: Pink conjunctiva, anicteric sclera. Lungs: Clear to auscultation bilaterally. Heart: Regular rate and rhythm. No rubs, murmurs, or gallops. Abdomen: Soft, nontender, nondistended. No organomegaly noted, normoactive bowel sounds. Musculoskeletal: No edema, cyanosis, or clubbing. Neuro: Alert, answering all questions appropriately. Cranial nerves grossly intact. Skin: No rashes or petechiae noted. Psych: Normal affect. Lymphatics: No cervical, calvicular, axillary or inguinal LAD.  LAB RESULTS:  Lab Results  Component Value Date   NA 147 (H) 01/04/2017   K 4.4 01/04/2017   CL 104 01/04/2017   CO2 27 01/04/2017   GLUCOSE 96 01/04/2017   BUN 10 01/04/2017   CREATININE 0.86 01/04/2017   CALCIUM 10.4 (H) 01/04/2017   PROT 6.4 01/04/2017   ALBUMIN 4.9 (H) 01/04/2017   AST 18 01/04/2017   ALT 8 01/04/2017   ALKPHOS 93 01/04/2017   BILITOT 0.5 01/04/2017    GFRNONAA 66 01/04/2017   GFRAA 76 01/04/2017    Lab Results  Component Value Date   WBC 59.2 (HH) 06/24/2017   NEUTROABS 3.7 06/24/2017   HGB 14.1 06/24/2017   HCT 44.0 06/24/2017   MCV 100.8 (H) 06/24/2017   PLT 149 (L) 06/24/2017     STUDIES: No results found.  ASSESSMENT: CLL, Rai stage 0  PLAN:    1. CLL: Patient completed 4 cycles of weekly Rituxan treatments in May 2017.  Patient had a doubling time of less than 6 months with her white blood cell count increased to greater than 100.  Her symptoms of weakness and fatigue have improved and her white count is slowly declining down to 59.2.  Proceed with cycle 3 of 4 of weekly Rituxan today.  Return to clinic in 1  week for further evaluation and consideration of cycle 4. 2. Thrombocytopenia: Nearly resolved.  Continue Rituxan as above. 3. Tobacco abuse: CT lung cancer screening on February 02, 2018 reported a Lungs-RADS 2-S.  Repeat in 1 year. 4.  Weakness and fatigue: Significantly improved.  Proceed with treatment as above.  Approximately 30 minutes was spent in discussion of which greater than 50% was consultation.   Patient expressed understanding and was in agreement with this plan. She also understands that She can call clinic at any time with any questions, concerns, or complaints.    Lloyd Huger, MD 06/24/17 1:37 PM

## 2017-06-24 ENCOUNTER — Encounter: Payer: Self-pay | Admitting: Oncology

## 2017-06-24 ENCOUNTER — Inpatient Hospital Stay: Payer: Medicare HMO

## 2017-06-24 ENCOUNTER — Inpatient Hospital Stay: Payer: Medicare HMO | Attending: Oncology | Admitting: Oncology

## 2017-06-24 VITALS — BP 144/83 | HR 90 | Temp 97.3°F | Resp 20 | Wt 157.8 lb

## 2017-06-24 DIAGNOSIS — R531 Weakness: Secondary | ICD-10-CM

## 2017-06-24 DIAGNOSIS — R69 Illness, unspecified: Secondary | ICD-10-CM | POA: Diagnosis not present

## 2017-06-24 DIAGNOSIS — Z5112 Encounter for antineoplastic immunotherapy: Secondary | ICD-10-CM | POA: Insufficient documentation

## 2017-06-24 DIAGNOSIS — R5383 Other fatigue: Secondary | ICD-10-CM | POA: Diagnosis not present

## 2017-06-24 DIAGNOSIS — F17210 Nicotine dependence, cigarettes, uncomplicated: Secondary | ICD-10-CM | POA: Diagnosis not present

## 2017-06-24 DIAGNOSIS — C9110 Chronic lymphocytic leukemia of B-cell type not having achieved remission: Secondary | ICD-10-CM

## 2017-06-24 DIAGNOSIS — D696 Thrombocytopenia, unspecified: Secondary | ICD-10-CM

## 2017-06-24 LAB — CBC WITH DIFFERENTIAL/PLATELET
Basophils Absolute: 0.1 10*3/uL (ref 0–0.1)
Basophils Relative: 0 %
Eosinophils Absolute: 0.3 10*3/uL (ref 0–0.7)
Eosinophils Relative: 1 %
HCT: 44 % (ref 35.0–47.0)
Hemoglobin: 14.1 g/dL (ref 12.0–16.0)
Lymphocytes Relative: 91 %
Lymphs Abs: 53.7 10*3/uL — ABNORMAL HIGH (ref 1.0–3.6)
MCH: 32.3 pg (ref 26.0–34.0)
MCHC: 32.1 g/dL (ref 32.0–36.0)
MCV: 100.8 fL — ABNORMAL HIGH (ref 80.0–100.0)
Monocytes Absolute: 1.3 10*3/uL — ABNORMAL HIGH (ref 0.2–0.9)
Monocytes Relative: 2 %
Neutro Abs: 3.7 10*3/uL (ref 1.4–6.5)
Neutrophils Relative %: 6 %
Platelets: 149 10*3/uL — ABNORMAL LOW (ref 150–440)
RBC: 4.36 MIL/uL (ref 3.80–5.20)
RDW: 13.1 % (ref 11.5–14.5)
WBC: 59.2 10*3/uL (ref 3.6–11.0)

## 2017-06-24 MED ORDER — SODIUM CHLORIDE 0.9 % IV SOLN
Freq: Once | INTRAVENOUS | Status: AC
  Administered 2017-06-24: 09:00:00 via INTRAVENOUS
  Filled 2017-06-24: qty 1000

## 2017-06-24 MED ORDER — DIPHENHYDRAMINE HCL 25 MG PO CAPS
25.0000 mg | ORAL_CAPSULE | Freq: Once | ORAL | Status: AC
  Administered 2017-06-24: 25 mg via ORAL
  Filled 2017-06-24: qty 1

## 2017-06-24 MED ORDER — ACETAMINOPHEN 325 MG PO TABS
650.0000 mg | ORAL_TABLET | Freq: Once | ORAL | Status: AC
  Administered 2017-06-24: 650 mg via ORAL
  Filled 2017-06-24: qty 2

## 2017-06-24 MED ORDER — SODIUM CHLORIDE 0.9 % IV SOLN
375.0000 mg/m2 | Freq: Once | INTRAVENOUS | Status: AC
  Administered 2017-06-24: 700 mg via INTRAVENOUS
  Filled 2017-06-24 (×2): qty 70

## 2017-06-24 MED ORDER — SODIUM CHLORIDE 0.9 % IV SOLN: 375.0000 mg/m2 | Freq: Once | INTRAVENOUS | Status: DC

## 2017-06-24 NOTE — Progress Notes (Signed)
Patient denies any concerns today.  

## 2017-06-26 ENCOUNTER — Other Ambulatory Visit: Payer: Self-pay | Admitting: Family Medicine

## 2017-06-26 NOTE — Telephone Encounter (Signed)
Your patient 

## 2017-06-27 NOTE — Progress Notes (Signed)
[place]  [phone]) [phone] Fax:(336) [phone]  ID: Ariellah Faust OB: 1941-01-26  MR#: [id number]  AJO#:[id number]  Patient Care Team: [name], MD as PCP - General (Family Medicine) [name], MD as PCP - Family Medicine (Family Medicine) Forest Gleason, MD (Oncology) Oneta Rack, MD (Dermatology) Manya Silvas, MD (Gastroenterology) Bary Castilla Forest Gleason, MD (General Surgery)  CHIEF COMPLAINT: CLL  INTERVAL HISTORY: Patient returns to clinic today for further evaluation and consideration of cycle 4 of 4 of her weekly Rituxan.  She continues to have mild weakness and fatigue, but this is significantly improved over the past several weeks.  She currently feels well and is asymptomatic. She denies any fevers, weight loss, or night sweats. She has no neurologic complaints. She has no chest pain or shortness of breath. She denies any nausea, vomiting, diarrhea, or constipation. She has no urinary complaints.  Patient feels at her baseline offers no specific complaints today.  REVIEW OF SYSTEMS:   Review of Systems  Constitutional: Negative.  Negative for fever, malaise/fatigue and weight loss.  HENT: Negative.  Negative for congestion and sinus pain.   Respiratory: Negative.  Negative for cough and shortness of breath.   Cardiovascular: Negative.  Negative for chest pain and leg swelling.  Gastrointestinal: Negative.  Negative for blood in stool, constipation, diarrhea, nausea and vomiting.  Genitourinary: Negative.  Negative for frequency, hematuria and urgency.  Musculoskeletal: Negative.   Skin: Negative.  Negative for rash.  Neurological: Negative.  Negative for tingling, sensory change, focal weakness, weakness and headaches.  Psychiatric/Behavioral: Negative.  The patient is not nervous/anxious and does not have insomnia.     As per HPI. Otherwise, a complete review of systems is negative.  PAST MEDICAL HISTORY: Past Medical  History:  Diagnosis Date  . Allergy   . Anxiety   . Depression   . GERD (gastroesophageal reflux disease)   . Hemorrhoids   . Leukemia, lymphoid ([place])    CLL  . Lobar pneumonia ([place])   . Osteopenia   . Personal history of chemotherapy     PAST SURGICAL HISTORY: Past Surgical History:  Procedure Laterality Date  . ABDOMINAL HYSTERECTOMY    . APPENDECTOMY    . BREAST BIOPSY Left    bx x 3-neg  . COLON SURGERY     sigmoid resection  . COLONOSCOPY     2007, 2012  . COLONOSCOPY WITH PROPOFOL N/A 09/17/2015   Procedure: COLONOSCOPY WITH PROPOFOL;  Surgeon: Robert Bellow, MD;  Location[facility] END[facility]COPY;  Service: Endoscopy;  Laterality: N/A;  . SPINE SURGERY     L4-5    FAMILY HISTORY Family History  Problem Relation Age of Onset  . Osteoporosis Mother   . Hypertension Father   . Heart attack Father   . Stroke Maternal Grandfather   . Breast cancer Sister 66       ADVANCED DIRECTIVES:    HEALTH MAINTENANCE: Social History   Tobacco Use  . Smoking status: Current Every Day Smoker    Packs/day: 1.00    Years: 35.00    Pack years: 35.00    Types: Cigarettes  . Smokeless tobacco: Never Used  Substance Use Topics  . Alcohol use: No    Alcohol/week: 0.0 oz  . Drug use: No     Allergies  Allergen Reactions  . Augmentin [Amoxicillin-Pot Clavulanate] Swelling    tongue  . Biaxin [Clarithromycin] Swelling and Rash    tongue    Current Outpatient Medications  Medication Sig Dispense Refill  . aspirin EC 81 MG tablet Take 81 mg by mouth every other day.     Marland Kitchen atorvastatin (LIPITOR) 10 MG tablet TAKE 1 TABLET (10 MG TOTAL) DAILY BY MOUTH. 90 tablet 1  . benazepril (LOTENSIN) 40 MG tablet Take 1 tablet (40 mg total) daily by mouth. 90 tablet 1  . calcium-vitamin D (OSCAL WITH D) 500-200 MG-UNIT tablet Take 1 tablet by mouth daily with breakfast. Dose of drugs may change based on what is on sale at the time    . diphenhydrAMINE (BENADRYL) 25 MG tablet Take 25  mg by mouth at bedtime as needed.    . fexofenadine (ALLEGRA) 180 MG tablet Take 180 mg by mouth daily. As needed    . LORazepam (ATIVAN) 1 MG tablet Take 0.5 tablets (0.5 mg total) by mouth 2 (two) times daily as needed for anxiety. 60 tablet 2  . Multiple Vitamins-Minerals (MULTIVITAMIN WITH MINERALS) tablet Take 1 tablet by mouth daily.    Marland Kitchen omeprazole (PRILOSEC) 20 MG capsule Take 1 capsule (20 mg total) daily as needed by mouth. 90 capsule 4  . PARoxetine (PAXIL) 30 MG tablet Take 1 tablet (30 mg total) daily by mouth. 90 tablet 1  . simethicone (MYLICON) 80 MG chewable tablet Chew 1 tablet (80 mg total) by mouth every 6 (six) hours as needed for flatulence. 30 tablet 0  . Triamcinolone Acetonide (NASACORT AQ NA) Place 2 Doses into the nose daily as needed.      No current facility-administered medications for this visit.     OBJECTIVE: Vitals:   07/01/17 0900  BP: 138/71  Pulse: 84  Resp: 20  Temp: 97.8 F (36.6 C)     Body mass index is 24.58 kg/m.    ECOG FS:0 - Asymptomatic  General: Well-developed, well-nourished, no acute distress. Eyes: Pink conjunctiva, anicteric sclera. Lungs: Clear to auscultation bilaterally. Heart: Regular rate and rhythm. No rubs, murmurs, or gallops. Abdomen: Soft, nontender, nondistended. No organomegaly noted, normoactive bowel sounds. Musculoskeletal: No edema, cyanosis, or clubbing. Neuro: Alert, answering all questions appropriately. Cranial nerves grossly intact. Skin: No rashes or petechiae noted. Psych: Normal affect. Lymphatics: No cervical, calvicular, axillary or inguinal LAD.  LAB RESULTS:  Lab Results  Component Value Date   NA 147 (H) 01/04/2017   K 4.4 01/04/2017   CL 104 01/04/2017   CO2 27 01/04/2017   GLUCOSE 96 01/04/2017   BUN 10 01/04/2017   CREATININE 0.86 01/04/2017   CALCIUM 10.4 (H) 01/04/2017   PROT 6.4 01/04/2017   ALBUMIN 4.9 (H) 01/04/2017   AST 18 01/04/2017   ALT 8 01/04/2017   ALKPHOS 93 01/04/2017     BILITOT 0.5 01/04/2017   GFRNONAA 66 01/04/2017   GFRAA 76 01/04/2017    Lab Results  Component Value Date   WBC 43.6 (H) 07/01/2017   NEUTROABS 3.3 07/01/2017   HGB 14.6 07/01/2017   HCT 43.4 07/01/2017   MCV 98.3 07/01/2017   PLT 159 07/01/2017     STUDIES: No results found.  ASSESSMENT: CLL, Rai stage 0  PLAN:    1. CLL: Patient completed 4 cycles of weekly Rituxan treatments in May 2017.  Patient had a doubling time of less than 6 months with her white blood cell count increased to greater than 100.  Patient now feels at her baseline and her white count continues to trend down and is now 43.6.  No further intervention is needed.  Patient is now completed 4 weeks  of weekly Rituxan and will return to clinic in 6 weeks with repeat laboratory work and further evaluation.   2. Thrombocytopenia: Resolved.  Recheck CBC in 6 weeks.   3. Tobacco abuse: CT lung cancer screening on February 02, 2018 reported a Lungs-RADS 2-S.  Repeat in 1 year. 4.  Weakness and fatigue: Resolved.  Approximately 30 minutes was spent in discussion of which greater than 50% was consultation.   Patient expressed understanding and was in agreement with this plan. She also understands that She can call clinic at any time with any questions, concerns, or complaints.    Lloyd Huger, MD 07/01/17 12:35 PM

## 2017-07-01 ENCOUNTER — Inpatient Hospital Stay (HOSPITAL_BASED_OUTPATIENT_CLINIC_OR_DEPARTMENT_OTHER): Payer: Medicare HMO | Admitting: Oncology

## 2017-07-01 ENCOUNTER — Inpatient Hospital Stay: Payer: Medicare HMO

## 2017-07-01 ENCOUNTER — Encounter: Payer: Self-pay | Admitting: Oncology

## 2017-07-01 VITALS — BP 138/71 | HR 84 | Temp 97.8°F | Resp 20 | Wt 157.0 lb

## 2017-07-01 DIAGNOSIS — R5383 Other fatigue: Secondary | ICD-10-CM

## 2017-07-01 DIAGNOSIS — R531 Weakness: Secondary | ICD-10-CM | POA: Diagnosis not present

## 2017-07-01 DIAGNOSIS — C9110 Chronic lymphocytic leukemia of B-cell type not having achieved remission: Secondary | ICD-10-CM | POA: Diagnosis not present

## 2017-07-01 DIAGNOSIS — F17210 Nicotine dependence, cigarettes, uncomplicated: Secondary | ICD-10-CM | POA: Diagnosis not present

## 2017-07-01 DIAGNOSIS — Z5112 Encounter for antineoplastic immunotherapy: Secondary | ICD-10-CM | POA: Diagnosis not present

## 2017-07-01 DIAGNOSIS — R69 Illness, unspecified: Secondary | ICD-10-CM | POA: Diagnosis not present

## 2017-07-01 LAB — CBC WITH DIFFERENTIAL/PLATELET
Basophils Absolute: 0.1 10*3/uL (ref 0–0.1)
Basophils Relative: 0 %
Eosinophils Absolute: 0.2 10*3/uL (ref 0–0.7)
Eosinophils Relative: 1 %
HCT: 43.4 % (ref 35.0–47.0)
Hemoglobin: 14.6 g/dL (ref 12.0–16.0)
Lymphocytes Relative: 89 %
Lymphs Abs: 39 10*3/uL — ABNORMAL HIGH (ref 1.0–3.6)
MCH: 33.1 pg (ref 26.0–34.0)
MCHC: 33.6 g/dL (ref 32.0–36.0)
MCV: 98.3 fL (ref 80.0–100.0)
Monocytes Absolute: 1 10*3/uL — ABNORMAL HIGH (ref 0.2–0.9)
Monocytes Relative: 2 %
Neutro Abs: 3.3 10*3/uL (ref 1.4–6.5)
Neutrophils Relative %: 8 %
Platelets: 159 10*3/uL (ref 150–440)
RBC: 4.41 MIL/uL (ref 3.80–5.20)
RDW: 13.4 % (ref 11.5–14.5)
WBC: 43.6 10*3/uL — ABNORMAL HIGH (ref 3.6–11.0)

## 2017-07-01 MED ORDER — DIPHENHYDRAMINE HCL 25 MG PO CAPS
25.0000 mg | ORAL_CAPSULE | Freq: Once | ORAL | Status: AC
  Administered 2017-07-01: 25 mg via ORAL
  Filled 2017-07-01: qty 1

## 2017-07-01 MED ORDER — SODIUM CHLORIDE 0.9 % IV SOLN: 375.0000 mg/m2 | Freq: Once | INTRAVENOUS | Status: DC

## 2017-07-01 MED ORDER — SODIUM CHLORIDE 0.9 % IV SOLN
Freq: Once | INTRAVENOUS | Status: AC
  Administered 2017-07-01: 09:00:00 via INTRAVENOUS
  Filled 2017-07-01: qty 1000

## 2017-07-01 MED ORDER — ACETAMINOPHEN 325 MG PO TABS
650.0000 mg | ORAL_TABLET | Freq: Once | ORAL | Status: AC
  Administered 2017-07-01: 650 mg via ORAL
  Filled 2017-07-01: qty 2

## 2017-07-01 MED ORDER — SODIUM CHLORIDE 0.9 % IV SOLN
375.0000 mg/m2 | Freq: Once | INTRAVENOUS | Status: AC
  Administered 2017-07-01: 700 mg via INTRAVENOUS
  Filled 2017-07-01: qty 70

## 2017-07-01 NOTE — Progress Notes (Signed)
Patient denies any concerns today.  

## 2017-07-04 ENCOUNTER — Ambulatory Visit: Payer: Medicare HMO | Admitting: Family Medicine

## 2017-07-20 ENCOUNTER — Ambulatory Visit: Payer: Medicare HMO | Admitting: Family Medicine

## 2017-08-01 DIAGNOSIS — R69 Illness, unspecified: Secondary | ICD-10-CM | POA: Diagnosis not present

## 2017-08-09 ENCOUNTER — Ambulatory Visit (INDEPENDENT_AMBULATORY_CARE_PROVIDER_SITE_OTHER): Payer: Medicare HMO | Admitting: Family Medicine

## 2017-08-09 ENCOUNTER — Encounter: Payer: Self-pay | Admitting: Family Medicine

## 2017-08-09 DIAGNOSIS — I10 Essential (primary) hypertension: Secondary | ICD-10-CM | POA: Diagnosis not present

## 2017-08-09 DIAGNOSIS — C9190 Lymphoid leukemia, unspecified not having achieved remission: Secondary | ICD-10-CM

## 2017-08-09 DIAGNOSIS — F3342 Major depressive disorder, recurrent, in full remission: Secondary | ICD-10-CM

## 2017-08-09 DIAGNOSIS — C9110 Chronic lymphocytic leukemia of B-cell type not having achieved remission: Secondary | ICD-10-CM

## 2017-08-09 DIAGNOSIS — K648 Other hemorrhoids: Secondary | ICD-10-CM | POA: Diagnosis not present

## 2017-08-09 DIAGNOSIS — R69 Illness, unspecified: Secondary | ICD-10-CM | POA: Diagnosis not present

## 2017-08-09 MED ORDER — PAROXETINE HCL 30 MG PO TABS: 30.0000 mg | ORAL_TABLET | Freq: Every day | ORAL | 1 refills | Status: DC

## 2017-08-09 MED ORDER — HYDROCORTISONE ACETATE 25 MG RE SUPP: 25.0000 mg | Freq: Two times a day (BID) | RECTAL | 6 refills | Status: DC

## 2017-08-09 MED ORDER — BENAZEPRIL HCL 40 MG PO TABS: 40.0000 mg | ORAL_TABLET | Freq: Every day | ORAL | 1 refills | Status: DC

## 2017-08-09 MED ORDER — ATORVASTATIN CALCIUM 10 MG PO TABS: 10.0000 mg | ORAL_TABLET | Freq: Every day | ORAL | 1 refills | Status: DC

## 2017-08-09 MED ORDER — LORAZEPAM 1 MG PO TABS
0.5000 mg | ORAL_TABLET | Freq: Two times a day (BID) | ORAL | 2 refills | Status: DC | PRN
Start: 2017-08-09 — End: 2018-01-09

## 2017-08-09 NOTE — Progress Notes (Signed)
BP 126/79   Pulse 80   Ht 5\' 5"  (1.651 m)   Wt 156 lb (70.8 kg)   SpO2 97%   BMI 25.96 kg/m    Subjective:    Patient ID: Katherine [name], female    DOB: 20-Mar-1940, 77 y.o.   MRN: [id number]  HPI: Katherine [name] is a 77 y.o. female  Chief Complaint  Patient presents with  . Follow-up  . Hypertension  . Constipation    Pattient states she had a sigmoid resection done in past and due to this always suffered from Katherine [name], however for last six months patient has had to use stool softners to have bm.   Patient while on chemo has had problems with constipation previously had problems with diarrhea she is in stool softeners to help.  Now biggest problem is hemorrhoids patient notes some occasional hemorrhoids being present.  Has used suppositories in the past  Relevant past medical, surgical, family and social history reviewed and updated as indicated. Interim medical history since our last visit reviewed. Allergies and medications reviewed and updated.  Review of Systems  Constitutional: Negative.   Respiratory: Negative.   Cardiovascular: Negative.     Per HPI unless specifically indicated above     Objective:    BP 126/79   Pulse 80   Ht 5\' 5"  (1.651 m)   Wt 156 lb (70.8 kg)   SpO2 97%   BMI 25.96 kg/m   Wt Readings from Last 3 Encounters:  08/09/17 156 lb (70.8 kg)  07/01/17 156 lb 15.5 oz (71.2 kg)  06/24/17 157 lb 13.6 oz (71.6 kg)    Physical Exam  Constitutional: She is oriented to person, place, and time. She appears well-developed and well-nourished.  HENT:  Head: Normocephalic and atraumatic.  Eyes: Conjunctivae and EOM are normal.  Neck: Normal range of motion.  Cardiovascular: Normal rate, regular rhythm and normal heart sounds.  Pulmonary/Chest: Effort normal and breath sounds normal.  Genitourinary:  Genitourinary Comments: Anal inspection reveals some small skin tags but no large hemorrhoids  Musculoskeletal: Normal range of motion.    Neurological: She is alert and oriented to person, place, and time.  Skin: No erythema.  Psychiatric: She has a normal mood and affect. Her behavior is normal. Judgment and thought content normal.    Results for orders placed or performed in visit on 07/01/17  CBC with Differential  Result Value Ref Range   WBC 43.6 (H) 3.6 - 11.0 K/uL   RBC 4.41 3.80 - 5.20 MIL/uL   Hemoglobin 14.6 12.0 - 16.0 g/dL   HCT 43.4 35.0 - 47.0 %   MCV 98.3 80.0 - 100.0 fL   MCH 33.1 26.0 - 34.0 pg   MCHC 33.6 32.0 - 36.0 g/dL   RDW 13.4 11.5 - 14.5 %   Platelets 159 150 - 440 K/uL   Neutrophils Relative % 8 %   Neutro Abs 3.3 1.4 - 6.5 K/uL   Lymphocytes Relative 89 %   Lymphs Abs 39.0 (H) 1.0 - 3.6 K/uL   Monocytes Relative 2 %   Monocytes Absolute 1.0 (H) 0.2 - 0.9 K/uL   Eosinophils Relative 1 %   Eosinophils Absolute 0.2 0 - 0.7 K/uL   Basophils Relative 0 %   Basophils Absolute 0.1 0 - 0.1 K/uL      Assessment & Plan:   Problem List Items Addressed This Visit      Cardiovascular and Mediastinum   Essential hypertension    The  current medical regimen is effective;  continue present plan and medications.       Relevant Medications   benazepril (LOTENSIN) 40 MG tablet   atorvastatin (LIPITOR) 10 MG tablet   Hemorrhoids, internal    Discussed hemorrhoid care and treatment prevention of constipation.      Relevant Medications   benazepril (LOTENSIN) 40 MG tablet   atorvastatin (LIPITOR) 10 MG tablet     Other   Depression   Relevant Medications   LORazepam (ATIVAN) 1 MG tablet   PARoxetine (PAXIL) 30 MG tablet   CLL (chronic lymphocytic leukemia) (HCC)    Followed by cancer center stable      Relevant Medications   LORazepam (ATIVAN) 1 MG tablet       Follow up plan: Return in about 6 months (around 02/08/2018) for Physical Exam.

## 2017-08-09 NOTE — Assessment & Plan Note (Signed)
The current medical regimen is effective;  continue present plan and medications.  

## 2017-08-09 NOTE — Assessment & Plan Note (Signed)
Followed by cancer center stable

## 2017-08-09 NOTE — Assessment & Plan Note (Signed)
Discussed hemorrhoid care and treatment prevention of constipation.

## 2017-08-12 ENCOUNTER — Encounter: Payer: Self-pay | Admitting: Nurse Practitioner

## 2017-08-12 ENCOUNTER — Inpatient Hospital Stay: Payer: Medicare HMO

## 2017-08-12 ENCOUNTER — Inpatient Hospital Stay: Payer: Medicare HMO | Attending: Oncology | Admitting: Nurse Practitioner

## 2017-08-12 VITALS — BP 133/78 | HR 77 | Temp 97.9°F | Resp 18 | Wt 155.9 lb

## 2017-08-12 DIAGNOSIS — R5383 Other fatigue: Secondary | ICD-10-CM | POA: Insufficient documentation

## 2017-08-12 DIAGNOSIS — F17210 Nicotine dependence, cigarettes, uncomplicated: Secondary | ICD-10-CM | POA: Diagnosis not present

## 2017-08-12 DIAGNOSIS — C9190 Lymphoid leukemia, unspecified not having achieved remission: Secondary | ICD-10-CM | POA: Diagnosis not present

## 2017-08-12 DIAGNOSIS — C9110 Chronic lymphocytic leukemia of B-cell type not having achieved remission: Secondary | ICD-10-CM | POA: Diagnosis not present

## 2017-08-12 DIAGNOSIS — R69 Illness, unspecified: Secondary | ICD-10-CM | POA: Diagnosis not present

## 2017-08-12 LAB — CBC WITH DIFFERENTIAL/PLATELET
Basophils Absolute: 0.1 10*3/uL (ref 0–0.1)
Basophils Relative: 0 %
Eosinophils Absolute: 0.2 10*3/uL (ref 0–0.7)
Eosinophils Relative: 1 %
HCT: 44.1 % (ref 35.0–47.0)
Hemoglobin: 15.1 g/dL (ref 12.0–16.0)
Lymphocytes Relative: 84 %
Lymphs Abs: 25.6 10*3/uL — ABNORMAL HIGH (ref 1.0–3.6)
MCH: 33.3 pg (ref 26.0–34.0)
MCHC: 34.2 g/dL (ref 32.0–36.0)
MCV: 97.5 fL (ref 80.0–100.0)
Monocytes Absolute: 0.7 10*3/uL (ref 0.2–0.9)
Monocytes Relative: 3 %
Neutro Abs: 3.5 10*3/uL (ref 1.4–6.5)
Neutrophils Relative %: 12 %
Platelets: 159 10*3/uL (ref 150–440)
RBC: 4.52 MIL/uL (ref 3.80–5.20)
RDW: 13.1 % (ref 11.5–14.5)
WBC: 30.1 10*3/uL — ABNORMAL HIGH (ref 3.6–11.0)

## 2017-08-12 NOTE — Progress Notes (Signed)
[place]  Telephone:(336) [phone] Fax:(336) [phone]  ID: Wyllow Seigler OB: 1941-02-05  MR#: [id number]  QPY#:[id number]  Patient Care Team: [name], MD as PCP - General (Family Medicine) [name], MD as PCP - Family Medicine (Family Medicine) Forest Gleason, MD (Oncology) Oneta Rack, MD (Dermatology) Manya Silvas, MD (Gastroenterology) Bary Castilla Forest Gleason, MD (General Surgery) Lloyd Huger, MD as Medical Oncologist (Hematology and Oncology)  CHIEF COMPLAINT: CLL  INTERVAL HISTORY: Patient returns to clinic today for follow-up. She completed 4 cycles of weekly Rituxan.  Patient was last seen in clinic by Dr. Grayland Ormond on 07/01/2017 for consideration of cycle 4 of 4 of weekly Rituxan.  At that time she was having mild weakness and fatigue that had improved.  Today, she reports feeling well and is asymptomatic.  She denies any fevers, weight loss, or night sweats.  She denies neurologic complaints.  She denies chest pain, shortness of breath, nausea, vomiting, diarrhea, or constipation.  She denies any urinary complaints.  She reports that her fatigue is improving and that she feels nearly at baseline.  She denies any new lumps or bumps.  REVIEW OF SYSTEMS:   Review of Systems  Constitutional: Negative.  Negative for fever, malaise/fatigue and weight loss.  HENT: Negative.  Negative for congestion and sinus pain.   Respiratory: Negative.  Negative for cough and shortness of breath.   Cardiovascular: Negative.  Negative for chest pain and leg swelling.  Gastrointestinal: Negative.  Negative for blood in stool, constipation, diarrhea, nausea and vomiting.  Genitourinary: Negative.  Negative for frequency, hematuria and urgency.  Musculoskeletal: Negative.   Skin: Negative.  Negative for rash.  Neurological: Negative.  Negative for tingling, sensory change, focal weakness, weakness and headaches.  Psychiatric/Behavioral: Negative.   The patient is not nervous/anxious and does not have insomnia.    As per HPI. Otherwise, a complete review of systems is negative.  PAST MEDICAL HISTORY: Past Medical History:  Diagnosis Date  . Allergy   . Anxiety   . Depression   . GERD (gastroesophageal reflux disease)   . Hemorrhoids   . Leukemia, lymphoid ([place])    CLL  . Lobar pneumonia ([place])   . Osteopenia   . Personal history of chemotherapy     PAST SURGICAL HISTORY: Past Surgical History:  Procedure Laterality Date  . ABDOMINAL HYSTERECTOMY    . APPENDECTOMY    . BREAST BIOPSY Left    bx x 3-neg  . COLON SURGERY     sigmoid resection  . COLONOSCOPY     2007, 2012  . COLONOSCOPY WITH PROPOFOL N/A 09/17/2015   Procedure: COLONOSCOPY WITH PROPOFOL;  Surgeon: Robert Bellow, MD;  Location: [facility] ENDOSCOPY;  Service: Endoscopy;  Laterality: N/A;  . SPINE SURGERY     L4-5    FAMILY HISTORY Family History  Problem Relation Age of Onset  . Osteoporosis Mother   . Hypertension Father   . Heart attack Father   . Stroke Maternal Grandfather   . Breast cancer Sister 65     ADVANCED DIRECTIVES:   HEALTH MAINTENANCE: Social History   Tobacco Use  . Smoking status: Current Every Day Smoker    Packs/day: 1.00    Years: 35.00    Pack years: 35.00    Types: Cigarettes  . Smokeless tobacco: Never Used  Substance Use Topics  . Alcohol use: No    Alcohol/week: 0.0 oz  . Drug use: No    Allergies  Allergen  Reactions  . Augmentin [Amoxicillin-Pot Clavulanate] Swelling    tongue  . Biaxin [Clarithromycin] Swelling and Rash    tongue    Current Outpatient Medications  Medication Sig Dispense Refill  . aspirin EC 81 MG tablet Take 81 mg by mouth every other day.     Marland Kitchen atorvastatin (LIPITOR) 10 MG tablet Take 1 tablet (10 mg total) by mouth daily. 90 tablet 1  . benazepril (LOTENSIN) 40 MG tablet Take 1 tablet (40 mg total) by mouth daily. 90 tablet 1  . calcium-vitamin D (OSCAL WITH D) 500-200 MG-UNIT  tablet Take 1 tablet by mouth daily with breakfast. Dose of drugs may change based on what is on sale at the time    . diphenhydrAMINE (BENADRYL) 25 MG tablet Take 25 mg by mouth at bedtime as needed.    . fexofenadine (ALLEGRA) 180 MG tablet Take 180 mg by mouth daily. As needed    . hydrocortisone (ANUSOL-HC) 25 MG suppository Place 1 suppository (25 mg total) rectally 2 (two) times daily. 12 suppository 6  . LORazepam (ATIVAN) 1 MG tablet Take 0.5 tablets (0.5 mg total) by mouth 2 (two) times daily as needed for anxiety. 60 tablet 2  . Multiple Vitamins-Minerals (MULTIVITAMIN WITH MINERALS) tablet Take 1 tablet by mouth daily.    Marland Kitchen omeprazole (PRILOSEC) 20 MG capsule Take 1 capsule (20 mg total) daily as needed by mouth. 90 capsule 4  . PARoxetine (PAXIL) 30 MG tablet Take 1 tablet (30 mg total) by mouth daily. 90 tablet 1  . simethicone (MYLICON) 80 MG chewable tablet Chew 1 tablet (80 mg total) by mouth every 6 (six) hours as needed for flatulence. 30 tablet 0  . Triamcinolone Acetonide (NASACORT AQ NA) Place 2 Doses into the nose daily as needed.      No current facility-administered medications for this visit.     OBJECTIVE: Vitals:   08/12/17 1115  BP: 133/78  Pulse: 77  Resp: 18  Temp: 97.9 F (36.6 C)     Body mass index is 25.94 kg/m.    ECOG FS:0 - Asymptomatic  General: Well-developed, well-nourished, no acute distress. Eyes: Pink conjunctiva, anicteric sclera. Lungs: Clear to auscultation bilaterally. Heart: Regular rate and rhythm. No rubs, murmurs, or gallops. Abdomen: Soft, nontender, nondistended. No organomegaly noted, normoactive bowel sounds. Musculoskeletal: No edema, cyanosis, or clubbing. Neuro: Alert, answering all questions appropriately. Cranial nerves grossly intact. Skin: No rashes or petechiae noted. Psych: Normal affect. Lymphatics: No cervical, calvicular, axillary or inguinal LAD.  LAB RESULTS:  Lab Results  Component Value Date   NA 147 (H)  01/04/2017   K 4.4 01/04/2017   CL 104 01/04/2017   CO2 27 01/04/2017   GLUCOSE 96 01/04/2017   BUN 10 01/04/2017   CREATININE 0.86 01/04/2017   CALCIUM 10.4 (H) 01/04/2017   PROT 6.4 01/04/2017   ALBUMIN 4.9 (H) 01/04/2017   AST 18 01/04/2017   ALT 8 01/04/2017   ALKPHOS 93 01/04/2017   BILITOT 0.5 01/04/2017   GFRNONAA 66 01/04/2017   GFRAA 76 01/04/2017    Lab Results  Component Value Date   WBC 30.1 (H) 08/12/2017   NEUTROABS 3.5 08/12/2017   HGB 15.1 08/12/2017   HCT 44.1 08/12/2017   MCV 97.5 08/12/2017   PLT 159 08/12/2017    STUDIES: No results found.  ASSESSMENT: CLL, Rai stage 0  PLAN:    1. CLL: Patient completed 4 cycles of weekly Rituxan in May 2017.  In 05/2017, she was noted to  have a wbc that was doubling in less than 6 months and had increased to greater than 100.  She received 4 cycles of weekly Rituxan 06/10/2017-07/01/2017.  She feels that fatigue and weakness has improved and her white blood cell count is trended down.  Today, wbc 30.1. Continue to monitor with repeat labs and re-evaluation.   2. Thrombocytopenia- Plt 159. Resolved. Continue to monitor.   3. Tobacco abuse: Followed by low dose ct lung cancer screening program.  Low-dose CT on 02/02/2017 reported as lung RADS 2S with recommendation to repeat in 01/2019.  4. Fatigue-overall patient feels that fatigue has improved.  She questions her thyroid function.  Will add on to labs at next appointment or she can follow-up with PCP in interim.   rtc in 6 weeks for labs and re-evaluation with Dr. Grayland Ormond.   Patient expressed understanding and was in agreement with this plan. She also understands that She can call clinic at any time with any questions, concerns, or complaints.    Beckey Rutter, DNP, AGNP-C [place] [facility] [phone] (work cell) [phone] (office)

## 2017-08-12 NOTE — Progress Notes (Signed)
Patient denies any concerns today.  

## 2017-09-04 IMAGING — MG MM DIGITAL SCREENING BILATERAL
1 series · 4 of 4 positions shown · non-contrast
Comparison: Previous exam(s).

CLINICAL DATA: Screening.

EXAM:
DIGITAL SCREENING BILATERAL MAMMOGRAM WITH CAD

[R CC · right · 4 of 4 slices shown]
[im 1/4]
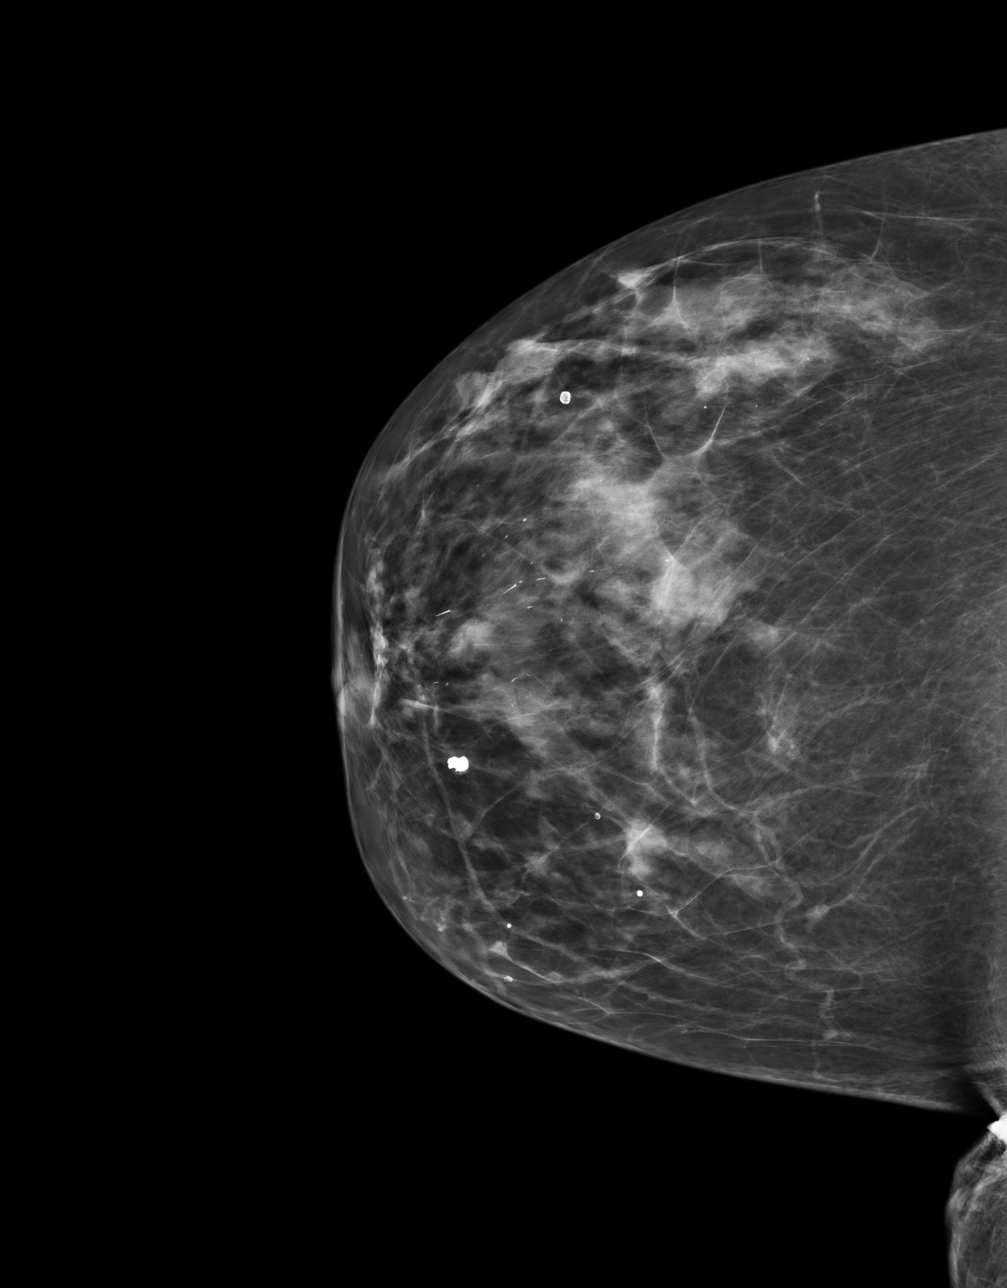
[im 2/4]
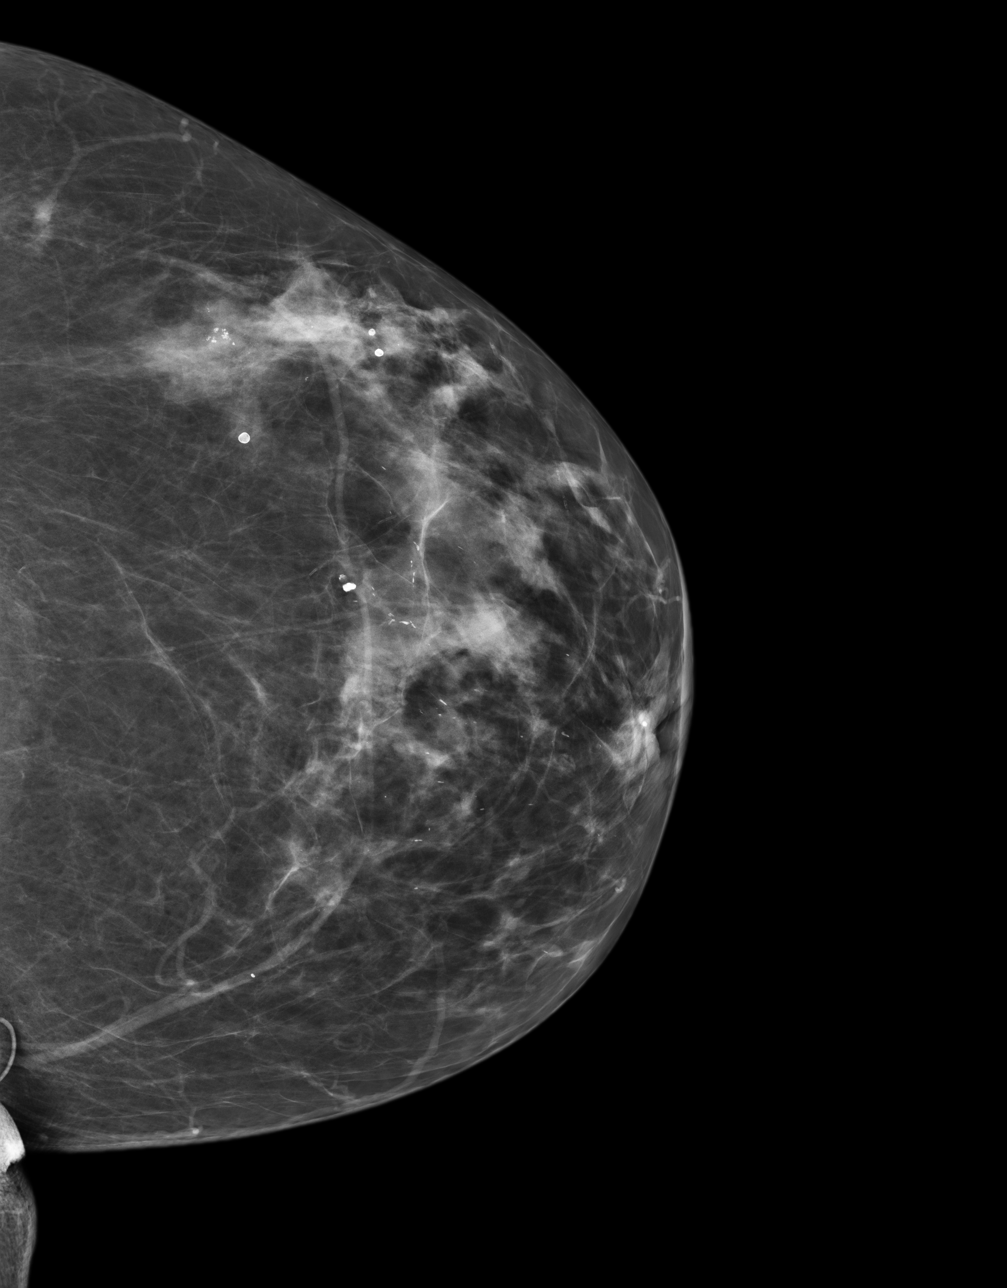
[im 3/4]
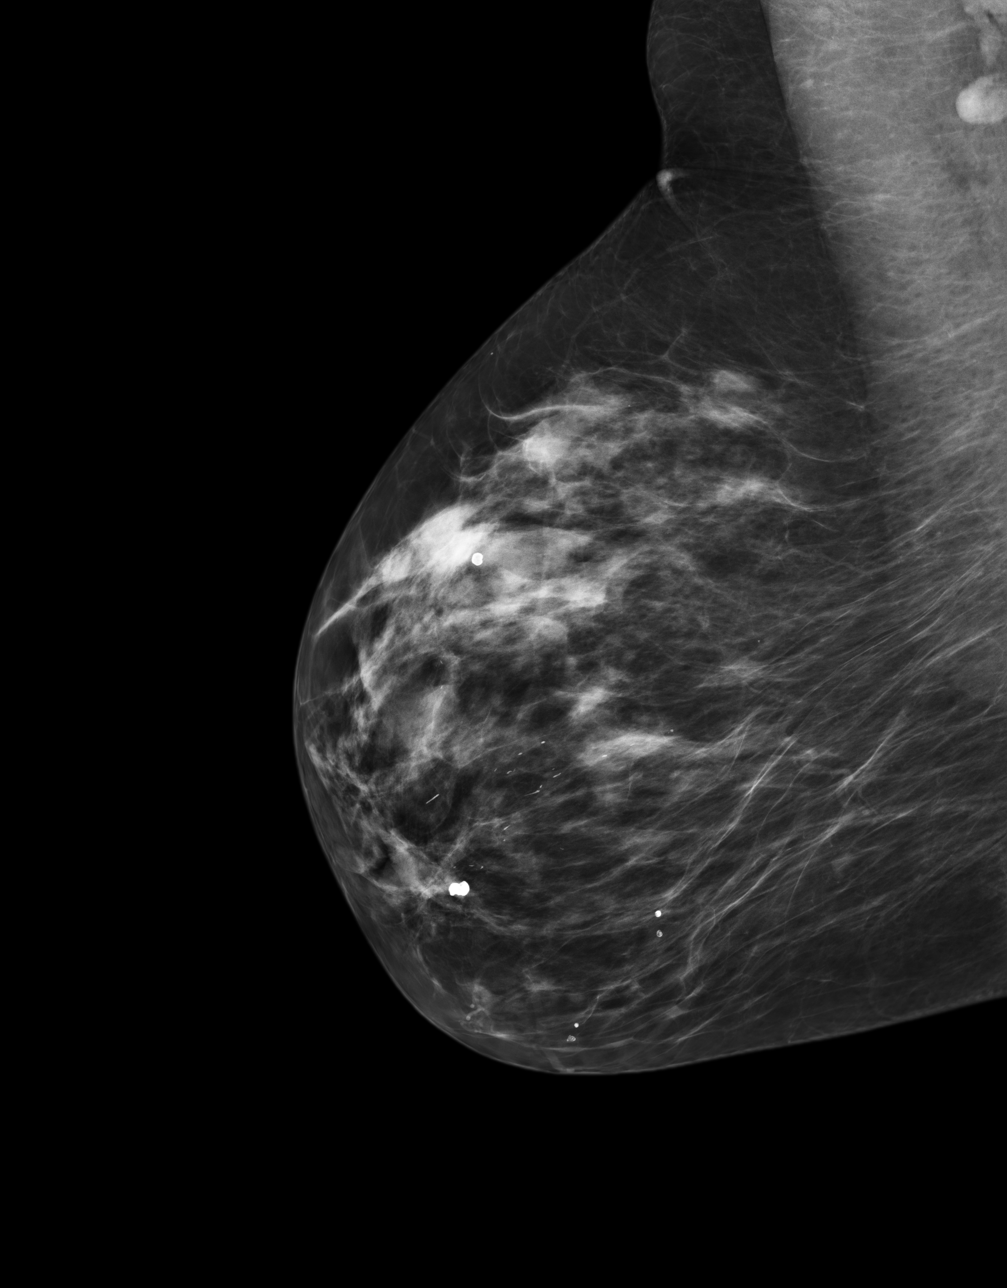
[im 4/4]
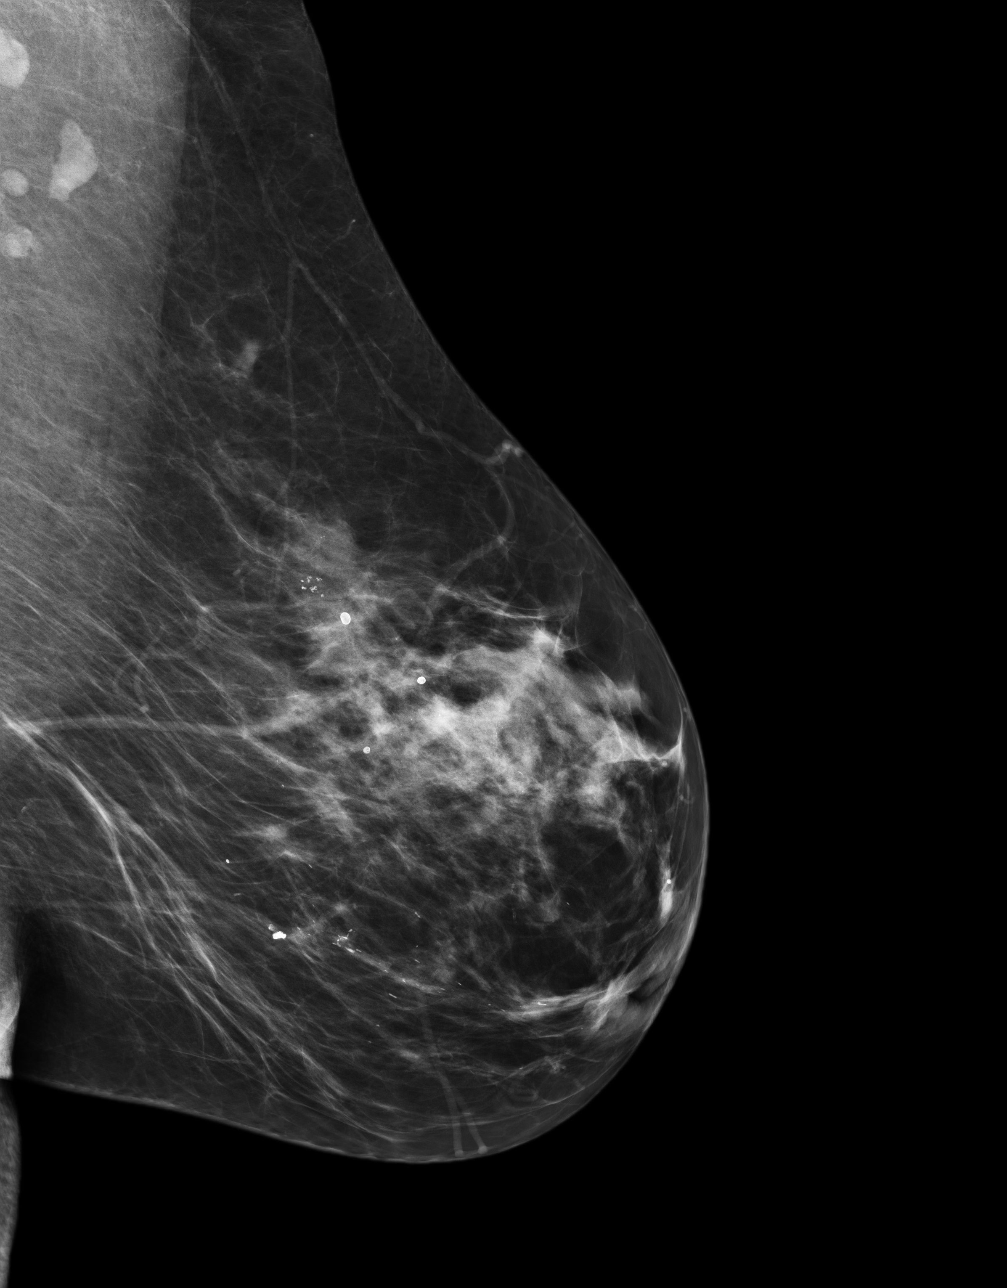

[4 of 4 positions shown; findings below may reference images not displayed]

ACR Breast Density Category c: The breast tissue is heterogeneously
dense, which may obscure small masses.
FINDINGS: There are no findings suspicious for malignancy. Images were
processed with CAD.
IMPRESSION: No mammographic evidence of malignancy. A result letter of this
screening mammogram will be mailed directly to the patient.

RECOMMENDATION:
Screening mammogram in one year. (Code:YJ-[id number])

BI-RADS CATEGORY  1: Negative.

## 2017-09-13 ENCOUNTER — Encounter: Payer: Self-pay | Admitting: Nurse Practitioner

## 2017-09-26 NOTE — Progress Notes (Signed)
[place]  [phone] [phone]  ID: Katherine [name] OB: 04/29/1940  MR#: [id number]  XNT#:[id number]  Patient Care Team: [name], MD as PCP - General (Family Medicine) [name], MD as PCP - Family Medicine (Family Medicine) Forest Gleason, MD (Inactive) (Oncology) Oneta Rack, MD (Dermatology) Manya Silvas, MD (Gastroenterology) Bary Castilla Forest Gleason, MD (General Surgery) Lloyd Huger, MD as Medical Oncologist (Hematology and Oncology)  CHIEF COMPLAINT: CLL  INTERVAL HISTORY: Patient returns to clinic today for repeat laboratory work and further evaluation.  She currently feels well and is asymptomatic.  She does not complain of weakness or fatigue today. She currently feels well and is asymptomatic. She denies any fevers, weight loss, or night sweats. She has no neurologic complaints. She has no chest pain or shortness of breath. She denies any nausea, vomiting, diarrhea, or constipation. She has no urinary complaints.  Patient offers no specific complaints today.  REVIEW OF SYSTEMS:   Review of Systems  Constitutional: Negative.  Negative for fever, malaise/fatigue and weight loss.  HENT: Negative.   Respiratory: Negative.  Negative for cough and shortness of breath.   Cardiovascular: Negative.  Negative for chest pain and leg swelling.  Gastrointestinal: Negative.  Negative for blood in stool, constipation, diarrhea, nausea and vomiting.  Genitourinary: Negative.  Negative for frequency, hematuria and urgency.  Musculoskeletal: Negative.   Skin: Negative.  Negative for rash.  Neurological: Negative.  Negative for tingling, sensory change, focal weakness, weakness and headaches.  Psychiatric/Behavioral: Negative.  The patient is not nervous/anxious and does not have insomnia.     As per HPI. Otherwise, a complete review of systems is negative.  PAST MEDICAL HISTORY: Past Medical History:  Diagnosis  Date  . Allergy   . Anxiety   . Depression   . GERD (gastroesophageal reflux disease)   . Hemorrhoids   . Leukemia, lymphoid ([place])    CLL  . Lobar pneumonia ([place])   . Osteopenia   . Personal history of chemotherapy     PAST SURGICAL HISTORY: Past Surgical History:  Procedure Laterality Date  . ABDOMINAL HYSTERECTOMY    . APPENDECTOMY    . BREAST BIOPSY Left    bx x 3-neg  . COLON SURGERY     sigmoid resection  . COLONOSCOPY     2007, 2012  . COLONOSCOPY WITH PROPOFOL N/A 09/17/2015   Procedure: COLONOSCOPY WITH PROPOFOL;  Surgeon: Robert Bellow, MD;  Location: [facility] ENDOSCOPY;  Service: Endoscopy;  Laterality: N/A;  . SPINE SURGERY     L4-5    FAMILY HISTORY Family History  Problem Relation Age of Onset  . Osteoporosis Mother   . Hypertension Father   . Heart attack Father   . Stroke Maternal Grandfather   . Breast cancer Sister 50       ADVANCED DIRECTIVES:    HEALTH MAINTENANCE: Social History   Tobacco Use  . Smoking status: Current Every Day Smoker    Packs/day: 1.00    Years: 35.00    Pack years: 35.00    Types: Cigarettes  . Smokeless tobacco: Never Used  Substance Use Topics  . Alcohol use: No    Alcohol/week: 0.0 standard drinks  . Drug use: No     Allergies  Allergen Reactions  . Augmentin [Amoxicillin-Pot Clavulanate] Swelling    tongue  . Biaxin [Clarithromycin] Swelling and Rash    tongue    Current Outpatient Medications  Medication Sig Dispense Refill  . aspirin  EC 81 MG tablet Take 81 mg by mouth every other day.     Marland Kitchen atorvastatin (LIPITOR) 10 MG tablet Take 1 tablet (10 mg total) by mouth daily. 90 tablet 1  . benazepril (LOTENSIN) 40 MG tablet Take 1 tablet (40 mg total) by mouth daily. 90 tablet 1  . calcium-vitamin D (OSCAL WITH D) 500-200 MG-UNIT tablet Take 1 tablet by mouth daily with breakfast. Dose of drugs may change based on what is on sale at the time    . diphenhydrAMINE (BENADRYL) 25 MG tablet Take 25 mg by  mouth at bedtime as needed.    . fexofenadine (ALLEGRA) 180 MG tablet Take 180 mg by mouth daily. As needed    . hydrocortisone (ANUSOL-HC) 25 MG suppository Place 1 suppository (25 mg total) rectally 2 (two) times daily. 12 suppository 6  . LORazepam (ATIVAN) 1 MG tablet Take 0.5 tablets (0.5 mg total) by mouth 2 (two) times daily as needed for anxiety. 60 tablet 2  . Multiple Vitamins-Minerals (MULTIVITAMIN WITH MINERALS) tablet Take 1 tablet by mouth daily.    Marland Kitchen omeprazole (PRILOSEC) 20 MG capsule Take 1 capsule (20 mg total) daily as needed by mouth. 90 capsule 4  . PARoxetine (PAXIL) 30 MG tablet Take 1 tablet (30 mg total) by mouth daily. 90 tablet 1  . simethicone (MYLICON) 80 MG chewable tablet Chew 1 tablet (80 mg total) by mouth every 6 (six) hours as needed for flatulence. 30 tablet 0  . Triamcinolone Acetonide (NASACORT AQ NA) Place 2 Doses into the nose daily as needed.      No current facility-administered medications for this visit.     OBJECTIVE: Vitals:   09/30/17 1104  BP: (!) 152/84  Pulse: 82  Resp: 20  Temp: 97.9 F (36.6 C)     Body mass index is 25.79 kg/m.    ECOG FS:0 - Asymptomatic  General: Well-developed, well-nourished, no acute distress. Eyes: Pink conjunctiva, anicteric sclera. HEENT: Normocephalic, moist mucous membranes. Lungs: Clear to auscultation bilaterally. Heart: Regular rate and rhythm. No rubs, murmurs, or gallops. Abdomen: Soft, nontender, nondistended. No organomegaly noted, normoactive bowel sounds. Musculoskeletal: No edema, cyanosis, or clubbing. Neuro: Alert, answering all questions appropriately. Cranial nerves grossly intact. Skin: No rashes or petechiae noted. Psych: Normal affect. Lymphatics: No cervical, calvicular, axillary or inguinal LAD.  LAB RESULTS:  Lab Results  Component Value Date   NA 147 (H) 01/04/2017   K 4.4 01/04/2017   CL 104 01/04/2017   CO2 27 01/04/2017   GLUCOSE 96 01/04/2017   BUN 10 01/04/2017    CREATININE 0.86 01/04/2017   CALCIUM 10.4 (H) 01/04/2017   PROT 6.4 01/04/2017   ALBUMIN 4.9 (H) 01/04/2017   AST 18 01/04/2017   ALT 8 01/04/2017   ALKPHOS 93 01/04/2017   BILITOT 0.5 01/04/2017   GFRNONAA 66 01/04/2017   GFRAA 76 01/04/2017    Lab Results  Component Value Date   WBC 18.4 (H) 09/30/2017   NEUTROABS 3.3 09/30/2017   HGB 15.8 09/30/2017   HCT 46.9 09/30/2017   MCV 99.0 09/30/2017   PLT 165 09/30/2017     STUDIES: No results found.  ASSESSMENT: CLL, Rai stage 0  PLAN:    1. CLL: Patient initially completed 4 cycles of weekly Rituxan on Jul 18, 2015.  She was recently retreated and completed 4 additional weekly treatments on Jul 01, 2017.  Her white count has trended down and is now 18.4.  No intervention is needed at this time.  Return to clinic in 3 months with repeat laboratory work and further evaluation.     2. Thrombocytopenia: Resolved. 3. Tobacco abuse: CT lung cancer screening on February 02, 2018 reported a Lungs-RADS 2-S.  Repeat in 1 year. 4.  Weakness and fatigue: Patient does not complain of this today.   Patient expressed understanding and was in agreement with this plan. She also understands that She can call clinic at any time with any questions, concerns, or complaints.    Lloyd Huger, MD 09/30/17 1:08 PM

## 2017-09-30 ENCOUNTER — Encounter: Payer: Self-pay | Admitting: Oncology

## 2017-09-30 ENCOUNTER — Inpatient Hospital Stay: Payer: Medicare HMO

## 2017-09-30 ENCOUNTER — Inpatient Hospital Stay: Payer: Medicare HMO | Attending: Oncology | Admitting: Oncology

## 2017-09-30 VITALS — BP 152/84 | HR 82 | Temp 97.9°F | Resp 20 | Wt 155.0 lb

## 2017-09-30 DIAGNOSIS — R69 Illness, unspecified: Secondary | ICD-10-CM | POA: Diagnosis not present

## 2017-09-30 DIAGNOSIS — C9110 Chronic lymphocytic leukemia of B-cell type not having achieved remission: Secondary | ICD-10-CM | POA: Insufficient documentation

## 2017-09-30 DIAGNOSIS — F17210 Nicotine dependence, cigarettes, uncomplicated: Secondary | ICD-10-CM | POA: Diagnosis not present

## 2017-09-30 DIAGNOSIS — Z9221 Personal history of antineoplastic chemotherapy: Secondary | ICD-10-CM | POA: Insufficient documentation

## 2017-09-30 LAB — TSH: TSH: 2.197 u[IU]/mL (ref 0.350–4.500)

## 2017-09-30 LAB — CBC WITH DIFFERENTIAL/PLATELET
Basophils Absolute: 0 10*3/uL (ref 0–0.1)
Basophils Relative: 0 %
Eosinophils Absolute: 0.2 10*3/uL (ref 0–0.7)
Eosinophils Relative: 1 %
HCT: 46.9 % (ref 35.0–47.0)
Hemoglobin: 15.8 g/dL (ref 12.0–16.0)
Lymphocytes Relative: 78 %
Lymphs Abs: 14.3 10*3/uL — ABNORMAL HIGH (ref 1.0–3.6)
MCH: 33.4 pg (ref 26.0–34.0)
MCHC: 33.7 g/dL (ref 32.0–36.0)
MCV: 99 fL (ref 80.0–100.0)
Monocytes Absolute: 0.6 10*3/uL (ref 0.2–0.9)
Monocytes Relative: 3 %
Neutro Abs: 3.3 10*3/uL (ref 1.4–6.5)
Neutrophils Relative %: 18 %
Platelets: 165 10*3/uL (ref 150–440)
RBC: 4.74 MIL/uL (ref 3.80–5.20)
RDW: 13.1 % (ref 11.5–14.5)
WBC: 18.4 10*3/uL — ABNORMAL HIGH (ref 3.6–11.0)

## 2017-09-30 NOTE — Progress Notes (Signed)
Patient denies any concerns today.  

## 2017-10-07 ENCOUNTER — Encounter: Payer: Self-pay | Admitting: Unknown Physician Specialty

## 2017-10-07 ENCOUNTER — Other Ambulatory Visit: Payer: Self-pay

## 2017-10-07 ENCOUNTER — Ambulatory Visit (INDEPENDENT_AMBULATORY_CARE_PROVIDER_SITE_OTHER): Payer: Medicare HMO | Admitting: Unknown Physician Specialty

## 2017-10-07 VITALS — BP 125/69 | HR 72 | Temp 98.2°F | Ht 65.0 in | Wt 154.1 lb

## 2017-10-07 DIAGNOSIS — N644 Mastodynia: Secondary | ICD-10-CM

## 2017-10-07 DIAGNOSIS — Z23 Encounter for immunization: Secondary | ICD-10-CM | POA: Diagnosis not present

## 2017-10-07 MED ORDER — BETAMETHASONE DIPROPIONATE AUG 0.05 % EX CREA: TOPICAL_CREAM | Freq: Two times a day (BID) | CUTANEOUS | 0 refills | Status: DC

## 2017-10-07 NOTE — Progress Notes (Signed)
BP 125/69   Pulse 72   Temp 98.2 F (36.8 C) (Oral)   Ht 5\' 5"  (1.651 m)   Wt 154 lb 1.6 oz (69.9 kg)   SpO2 95%   BMI 25.64 kg/m    Subjective:    Patient ID: [name], female    DOB: 1940-10-28, 77 y.o.   MRN: [id number]  HPI: Madyson Lukach is a 77 y.o. female  Chief Complaint  Patient presents with  . Pain    pt states has been having left breast sore to touch since Sunday   Left breast pain for 5 days.  States the nipple is "sore as a boil."  She has tried Neosporin, heating pad, and warm moist compressions.  No fever. No discharge.  Last mammogram was 01/10/2017 and was normal  Relevant past medical, surgical, family and social history reviewed and updated as indicated. Interim medical history since our last visit reviewed. Allergies and medications reviewed and updated.  Review of Systems  Per HPI unless specifically indicated above     Objective:    BP 125/69   Pulse 72   Temp 98.2 F (36.8 C) (Oral)   Ht 5\' 5"  (1.651 m)   Wt 154 lb 1.6 oz (69.9 kg)   SpO2 95%   BMI 25.64 kg/m   Wt Readings from Last 3 Encounters:  10/07/17 154 lb 1.6 oz (69.9 kg)  09/30/17 154 lb 15.7 oz (70.3 kg)  08/12/17 155 lb 13.8 oz (70.7 kg)    Physical Exam  Constitutional: She is oriented to person, place, and time. She appears well-developed and well-nourished. No distress.  HENT:  Head: Normocephalic and atraumatic.  Eyes: Conjunctivae and lids are normal. Right eye exhibits no discharge. Left eye exhibits no discharge. No scleral icterus.  Cardiovascular: Normal rate.  Pulmonary/Chest: Effort normal. Right breast exhibits inverted nipple. Right breast exhibits no mass, no nipple discharge, no skin change and no tenderness. Left breast exhibits inverted nipple. Left breast exhibits no mass, no nipple discharge, no skin change and no tenderness. No breast swelling, tenderness, discharge or bleeding. Breasts are symmetrical.  Mild swelling noted 12 o clock left  nipple  Abdominal: Normal appearance. There is no splenomegaly or hepatomegaly.  Musculoskeletal: Normal range of motion.  Neurological: She is alert and oriented to person, place, and time.  Skin: Skin is intact. No rash noted. No pallor.  Psychiatric: She has a normal mood and affect. Her behavior is normal. Judgment and thought content normal.    Results for orders placed or performed in visit on 09/30/17  TSH  Result Value Ref Range   TSH 2.197 0.350 - 4.500 uIU/mL  CBC with Differential/Platelet  Result Value Ref Range   WBC 18.4 (H) 3.6 - 11.0 K/uL   RBC 4.74 3.80 - 5.20 MIL/uL   Hemoglobin 15.8 12.0 - 16.0 g/dL   HCT 46.9 35.0 - 47.0 %   MCV 99.0 80.0 - 100.0 fL   MCH 33.4 26.0 - 34.0 pg   MCHC 33.7 32.0 - 36.0 g/dL   RDW 13.1 11.5 - 14.5 %   Platelets 165 150 - 440 K/uL   Neutrophils Relative % 18 %   Neutro Abs 3.3 1.4 - 6.5 K/uL   Lymphocytes Relative 78 %   Lymphs Abs 14.3 (H) 1.0 - 3.6 K/uL   Monocytes Relative 3 %   Monocytes Absolute 0.6 0.2 - 0.9 K/uL   Eosinophils Relative 1 %   Eosinophils Absolute 0.2 0 - 0.7  K/uL   Basophils Relative 0 %   Basophils Absolute 0.0 0 - 0.1 K/uL      Assessment & Plan:   Problem List Items Addressed This Visit    None    Visit Diagnoses    Flu vaccine need    -  Primary   Relevant Orders   Flu Vaccine QUAD 36+ mos IM   Nipple pain       no abnormalities noted.  ? localized irritaint.  Will rx Diprolene cream BID prn       Follow up plan: Return if symptoms worsen or fail to improve.

## 2017-12-03 ENCOUNTER — Other Ambulatory Visit: Payer: Self-pay

## 2017-12-03 ENCOUNTER — Emergency Department
Admission: EM | Admit: 2017-12-03 | Discharge: 2017-12-03 | Disposition: A | Discharge status: Home / Self Care | Payer: Medicare HMO | Attending: Emergency Medicine | Admitting: Emergency Medicine

## 2017-12-03 ENCOUNTER — Emergency Department: Payer: Medicare HMO

## 2017-12-03 ENCOUNTER — Encounter: Payer: Self-pay | Admitting: Emergency Medicine

## 2017-12-03 DIAGNOSIS — I2510 Atherosclerotic heart disease of native coronary artery without angina pectoris: Secondary | ICD-10-CM | POA: Insufficient documentation

## 2017-12-03 DIAGNOSIS — F17210 Nicotine dependence, cigarettes, uncomplicated: Secondary | ICD-10-CM | POA: Insufficient documentation

## 2017-12-03 DIAGNOSIS — K5792 Diverticulitis of intestine, part unspecified, without perforation or abscess without bleeding: Secondary | ICD-10-CM

## 2017-12-03 DIAGNOSIS — R69 Illness, unspecified: Secondary | ICD-10-CM | POA: Diagnosis not present

## 2017-12-03 DIAGNOSIS — Z7982 Long term (current) use of aspirin: Secondary | ICD-10-CM | POA: Diagnosis not present

## 2017-12-03 DIAGNOSIS — R1032 Left lower quadrant pain: Secondary | ICD-10-CM | POA: Diagnosis not present

## 2017-12-03 DIAGNOSIS — F329 Major depressive disorder, single episode, unspecified: Secondary | ICD-10-CM | POA: Insufficient documentation

## 2017-12-03 DIAGNOSIS — Z79899 Other long term (current) drug therapy: Secondary | ICD-10-CM | POA: Diagnosis not present

## 2017-12-03 DIAGNOSIS — K8020 Calculus of gallbladder without cholecystitis without obstruction: Secondary | ICD-10-CM | POA: Diagnosis not present

## 2017-12-03 DIAGNOSIS — F419 Anxiety disorder, unspecified: Secondary | ICD-10-CM | POA: Diagnosis not present

## 2017-12-03 DIAGNOSIS — Z856 Personal history of leukemia: Secondary | ICD-10-CM | POA: Insufficient documentation

## 2017-12-03 DIAGNOSIS — K5732 Diverticulitis of large intestine without perforation or abscess without bleeding: Secondary | ICD-10-CM | POA: Diagnosis not present

## 2017-12-03 DIAGNOSIS — I10 Essential (primary) hypertension: Secondary | ICD-10-CM | POA: Insufficient documentation

## 2017-12-03 LAB — CBC
HCT: 45.3 % (ref 36.0–46.0)
Hemoglobin: 15.2 g/dL — ABNORMAL HIGH (ref 12.0–15.0)
MCH: 33 pg (ref 26.0–34.0)
MCHC: 33.6 g/dL (ref 30.0–36.0)
MCV: 98.3 fL (ref 80.0–100.0)
Platelets: 136 10*3/uL — ABNORMAL LOW (ref 150–400)
RBC: 4.61 MIL/uL (ref 3.87–5.11)
RDW: 12.4 % (ref 11.5–15.5)
WBC: 11.1 10*3/uL — ABNORMAL HIGH (ref 4.0–10.5)
nRBC: 0 % (ref 0.0–0.2)

## 2017-12-03 LAB — COMPREHENSIVE METABOLIC PANEL
ALT: 12 U/L (ref 0–44)
AST: 18 U/L (ref 15–41)
Albumin: 4.4 g/dL (ref 3.5–5.0)
Alkaline Phosphatase: 66 U/L (ref 38–126)
Anion gap: 9 (ref 5–15)
BUN: 7 mg/dL — ABNORMAL LOW (ref 8–23)
CO2: 27 mmol/L (ref 22–32)
Calcium: 9.4 mg/dL (ref 8.9–10.3)
Chloride: 106 mmol/L (ref 98–111)
Creatinine, Ser: 0.67 mg/dL (ref 0.44–1.00)
GFR calc Af Amer: >60 mL/min (ref >60)
GFR calc non Af Amer: >60 mL/min (ref >60)
Glucose, Bld: 118 mg/dL — ABNORMAL HIGH (ref 70–99)
Potassium: 3.9 mmol/L (ref 3.5–5.1)
Sodium: 142 mmol/L (ref 135–145)
Total Bilirubin: 1 mg/dL (ref 0.3–1.2)
Total Protein: 7 g/dL (ref 6.5–8.1)

## 2017-12-03 LAB — URINALYSIS, COMPLETE (UACMP) WITH MICROSCOPIC
Bacteria, UA: NONE SEEN
Bilirubin Urine: NEGATIVE
Glucose, UA: NEGATIVE mg/dL
Ketones, ur: 5 mg/dL — AB
Leukocytes, UA: NEGATIVE
Nitrite: NEGATIVE
Protein, ur: NEGATIVE mg/dL
Specific Gravity, Urine: 1.009 (ref 1.005–1.030)
pH: 6 (ref 5.0–8.0)

## 2017-12-03 LAB — LIPASE, BLOOD: Lipase: 27 U/L (ref 11–51)

## 2017-12-03 MED ORDER — ONDANSETRON HCL 4 MG/2ML IJ SOLN
4.0000 mg | Freq: Once | INTRAMUSCULAR | Status: AC
  Administered 2017-12-03: 4 mg via INTRAVENOUS
  Filled 2017-12-03: qty 2

## 2017-12-03 MED ORDER — CIPROFLOXACIN HCL 500 MG PO TABS
500.0000 mg | ORAL_TABLET | Freq: Once | ORAL | Status: AC
  Administered 2017-12-03: 500 mg via ORAL
  Filled 2017-12-03: qty 1

## 2017-12-03 MED ORDER — IOPAMIDOL (ISOVUE-300) INJECTION 61%
100.0000 mL | Freq: Once | INTRAVENOUS | Status: AC | PRN
  Administered 2017-12-03: 100 mL via INTRAVENOUS

## 2017-12-03 MED ORDER — IOPAMIDOL (ISOVUE-300) INJECTION 61%
30.0000 mL | Freq: Once | INTRAVENOUS | Status: AC | PRN
  Administered 2017-12-03: 30 mL via ORAL

## 2017-12-03 MED ORDER — METRONIDAZOLE 500 MG PO TABS: 500.0000 mg | ORAL_TABLET | Freq: Three times a day (TID) | ORAL | 0 refills | Status: AC

## 2017-12-03 MED ORDER — CIPROFLOXACIN HCL 500 MG PO TABS: 500.0000 mg | ORAL_TABLET | Freq: Two times a day (BID) | ORAL | 0 refills | Status: AC

## 2017-12-03 MED ORDER — MORPHINE SULFATE (PF) 4 MG/ML IV SOLN
4.0000 mg | Freq: Once | INTRAVENOUS | Status: AC
  Administered 2017-12-03: 4 mg via INTRAVENOUS
  Filled 2017-12-03: qty 1

## 2017-12-03 MED ORDER — METRONIDAZOLE 500 MG PO TABS
500.0000 mg | ORAL_TABLET | Freq: Once | ORAL | Status: AC
  Administered 2017-12-03: 500 mg via ORAL
  Filled 2017-12-03: qty 1

## 2017-12-03 MED ORDER — HYDROCODONE-ACETAMINOPHEN 5-325 MG PO TABS: 1.0000 | ORAL_TABLET | ORAL | 0 refills | Status: DC | PRN

## 2017-12-03 MED ORDER — SODIUM CHLORIDE 0.9 % IV BOLUS
1000.0000 mL | Freq: Once | INTRAVENOUS | Status: AC
  Administered 2017-12-03: 1000 mL via INTRAVENOUS

## 2017-12-03 NOTE — ED Provider Notes (Signed)
[facility] Emergency Department Provider Note  Time seen: 11:06 AM  I have reviewed the triage vital signs and the nursing notes.   HISTORY  Chief Complaint GI Problem    HPI Katherine [name] is a 77 y.o. female with a past medical history of anxiety, depression, gastric reflux, CLL, presents to the emergency department for left-sided abdominal pain.  According to the patient for the past 4 days she has been experiencing left-sided/left lower quadrant abdominal pain.  Moderate dull aching type pain.  Intermittent nausea, denies any diarrhea.  Denies any fever.  Patient has a history of diverticulitis in the past status post 2 colon resections.  States normally when she gets diverticulitis she has diarrhea but has not developed any diarrhea yet.  Denies any dysuria or hematuria.   Past Medical History:  Diagnosis Date  . Allergy   . Anxiety   . Depression   . GERD (gastroesophageal reflux disease)   . Hemorrhoids   . Leukemia, lymphoid ([place])    CLL  . Lobar pneumonia ([place])   . Osteopenia   . Personal history of chemotherapy     Patient Active Problem List   Diagnosis Date Noted  . Hemorrhoids, internal 08/09/2017  . Anxiety 01/04/2017  . Hypercholesterolemia 01/08/2016  . Aortic atherosclerosis ([place]) 01/07/2016  . History of colonic polyps 08/14/2015  . CLL (chronic lymphocytic leukemia) ([place]) 05/28/2015  . Senile purpura ([place]) 01/06/2015  . Essential hypertension 01/06/2015  . Depression 01/06/2015    Past Surgical History:  Procedure Laterality Date  . ABDOMINAL HYSTERECTOMY    . APPENDECTOMY    . BREAST BIOPSY Left    bx x 3-neg  . COLON SURGERY     sigmoid resection  . COLONOSCOPY     2007, 2012  . COLONOSCOPY WITH PROPOFOL N/A 09/17/2015   Procedure: COLONOSCOPY WITH PROPOFOL;  Surgeon: Robert Bellow, MD;  Location: [facility] ENDOSCOPY;  Service: Endoscopy;  Laterality: N/A;  . SPINE SURGERY     L4-5    Prior to Admission  medications   Medication Sig Start Date End Date Taking? Authorizing Provider  aspirin EC 81 MG tablet Take 81 mg by mouth every other day.     [provider]  atorvastatin (LIPITOR) 10 MG tablet Take 1 tablet (10 mg total) by mouth daily. 08/09/17   [name], MD  augmented betamethasone dipropionate (DIPROLENE AF) 0.05 % cream Apply topically 2 (two) times daily. 10/07/17   Kathrine Haddock, NP  benazepril (LOTENSIN) 40 MG tablet Take 1 tablet (40 mg total) by mouth daily. 08/09/17   [name], MD  diphenhydrAMINE (BENADRYL) 25 MG tablet Take 25 mg by mouth at bedtime as needed.    [provider]  fexofenadine (ALLEGRA) 180 MG tablet Take 180 mg by mouth daily. As needed    [provider]  hydrocortisone (ANUSOL-HC) 25 MG suppository Place 1 suppository (25 mg total) rectally 2 (two) times daily. 08/09/17   [name], MD  LORazepam (ATIVAN) 1 MG tablet Take 0.5 tablets (0.5 mg total) by mouth 2 (two) times daily as needed for anxiety. 08/09/17   [name], MD  Multiple Vitamins-Minerals (MULTIVITAMIN WITH MINERALS) tablet Take 1 tablet by mouth daily.    [provider]  omeprazole (PRILOSEC) 20 MG capsule Take 1 capsule (20 mg total) daily as needed by mouth. 01/04/17   Volney American, PA-C  PARoxetine (PAXIL) 30 MG tablet Take 1 tablet (30 mg total) by  mouth daily. 08/09/17   [name], MD  simethicone (MYLICON) 80 MG chewable tablet Chew 1 tablet (80 mg total) by mouth every 6 (six) hours as needed for flatulence. 11/24/16   Norval Gable, MD  Triamcinolone Acetonide (NASACORT AQ NA) Place 2 Doses into the nose daily as needed.     [provider]    Allergies  Allergen Reactions  . Augmentin [Amoxicillin-Pot Clavulanate] Swelling    tongue  . Biaxin [Clarithromycin] Swelling and Rash    tongue    Family History  Problem Relation Age of Onset  . Osteoporosis Mother   . Hypertension Father   . Heart  attack Father   . Stroke Maternal Grandfather   . Breast cancer Sister 43    Social History Social History   Tobacco Use  . Smoking status: Current Every Day Smoker    Packs/day: 1.00    Years: 35.00    Pack years: 35.00    Types: Cigarettes  . Smokeless tobacco: Never Used  Substance Use Topics  . Alcohol use: No    Alcohol/week: 0.0 standard drinks  . Drug use: No    Review of Systems Constitutional: Negative for fever. Cardiovascular: Negative for chest pain. Respiratory: Negative for shortness of breath. Gastrointestinal: Left lower quadrant abdominal pain.  Positive for nausea.  Negative vomiting and diarrhea. Genitourinary: Negative for urinary compaints Musculoskeletal: Negative for musculoskeletal complaints Skin: Negative for skin complaints  Neurological: Negative for headache All other ROS negative  ____________________________________________   PHYSICAL EXAM:  VITAL SIGNS: ED Triage Vitals  Enc Vitals Group     BP 12/03/17 1045 130/60     Pulse Rate 12/03/17 1045 76     Resp 12/03/17 1045 20     Temp 12/03/17 1045 98 F (36.7 C)     Temp Source 12/03/17 1045 Oral     SpO2 12/03/17 1045 95 %     Weight 12/03/17 1042 154 lb (69.9 kg)     Height 12/03/17 1042 5\' 6"  (1.676 m)     Head Circumference --      Peak Flow --      Pain Score 12/03/17 1042 0     Pain Loc --      Pain Edu? --      Excl. in [place]? --    Constitutional: Alert and oriented.  Overall well-appearing but with mild discomfort. Eyes: Normal exam ENT   Head: Normocephalic and atraumatic.   Mouth/Throat: Mucous membranes are moist. Cardiovascular: Normal rate, regular rhythm. No murmur Respiratory: Normal respiratory effort without tachypnea nor retractions. Breath sounds are clear Gastrointestinal: Soft, mild tenderness to palpation of the left abdomen including left upper quadrant, left lower quadrant.  No rebound guarding or distention.  Abdomen otherwise  benign. Musculoskeletal: Nontender with normal range of motion in all extremities.  Neurologic:  Normal speech and language. No gross focal neurologic deficits Skin:  Skin is warm, dry and intact.  Psychiatric: Mood and affect are normal. Speech and behavior are normal.   ____________________________________________    RADIOLOGY  CT consistent with uncomplicated sigmoid diverticulitis  ____________________________________________   INITIAL IMPRESSION / ASSESSMENT AND PLAN / ED COURSE  Pertinent labs & imaging results that were available during my care of the patient were reviewed by me and considered in my medical decision making (see chart for details).  The patient presents to the emergency department for left-sided abdominal pain.  Pain is been ongoing for the past 4 days.  Differential this time would  include diverticulitis, colitis, small bowel obstruction, urinary tract infection/pyelonephritis.  We will check labs, treat pain and nausea, obtain CT imaging of the abdomen/pelvis to further evaluate.  Patient agreeable to plan.  Reassuringly patient's vitals are largely within normal limits and overall the patient appears well but at times does appear mildly uncomfortable.  Patient's labs are resulted largely within normal limits very slight leukocytosis of 11,100.  CT scan of resulted showing non-complicated sigmoid diverticulitis.  Vitals are reassuring.  Patient appears well after pain medication.  We will discharge on antibiotics and pain medication have the patient follow-up with her doctor.  I discussed return precautions for any acute worsening of abdominal pain or development of fever.  Patient agreeable to plan of care.  ____________________________________________   FINAL CLINICAL IMPRESSION(S) / ED DIAGNOSES  Left-sided abdominal pain Sigmoid diverticulitis   Harvest Dark, MD 12/03/17 1249

## 2017-12-03 NOTE — ED Notes (Signed)
Patient transported to CT 

## 2017-12-03 NOTE — ED Triage Notes (Signed)
Pt here with c/o left lower quadrant abd pain and gas/bloating for the past few days, appears in NAD.

## 2017-12-03 NOTE — ED Notes (Signed)
Patient denies pain and is resting comfortably. States pain is intermittent and r/t movement.  When lying in bed and being still, denies pain.

## 2017-12-03 NOTE — ED Triage Notes (Signed)
LLQ painful to touch, pt has been diagnosed in the past with diverticulitis.

## 2017-12-03 NOTE — ED Notes (Signed)
AAOx3.  Skin warm and dry.  NAD 

## 2017-12-05 ENCOUNTER — Other Ambulatory Visit: Payer: Self-pay | Admitting: Family Medicine

## 2017-12-05 DIAGNOSIS — Z1231 Encounter for screening mammogram for malignant neoplasm of breast: Secondary | ICD-10-CM

## 2017-12-06 ENCOUNTER — Encounter: Payer: Self-pay | Admitting: Family Medicine

## 2017-12-06 ENCOUNTER — Ambulatory Visit (INDEPENDENT_AMBULATORY_CARE_PROVIDER_SITE_OTHER): Payer: Medicare HMO | Admitting: Family Medicine

## 2017-12-06 VITALS — BP 120/72 | HR 76 | Temp 98.2°F | Ht 66.0 in | Wt 155.2 lb

## 2017-12-06 DIAGNOSIS — K5792 Diverticulitis of intestine, part unspecified, without perforation or abscess without bleeding: Secondary | ICD-10-CM

## 2017-12-06 DIAGNOSIS — A09 Infectious gastroenteritis and colitis, unspecified: Secondary | ICD-10-CM | POA: Diagnosis not present

## 2017-12-06 DIAGNOSIS — R1030 Lower abdominal pain, unspecified: Secondary | ICD-10-CM | POA: Diagnosis not present

## 2017-12-06 LAB — CBC WITH DIFFERENTIAL/PLATELET
Hematocrit: 43.2 % (ref 34.0–46.6)
Hemoglobin: 15.2 g/dL (ref 11.1–15.9)
Lymphocytes Absolute: 4.8 10*3/uL — ABNORMAL HIGH (ref 0.7–3.1)
Lymphs: 74 %
MCH: 34.2 pg — ABNORMAL HIGH (ref 26.6–33.0)
MCHC: 35.2 g/dL (ref 31.5–35.7)
MCV: 97 fL (ref 79–97)
MID (Absolute): 0.5 10*3/uL (ref 0.1–1.6)
MID: 8 %
Neutrophils Absolute: 1.2 10*3/uL — ABNORMAL LOW (ref 1.4–7.0)
Neutrophils: 19 %
Platelets: 134 10*3/uL — ABNORMAL LOW (ref 150–450)
RBC: 4.45 x10E6/uL (ref 3.77–5.28)
RDW: 12.9 % (ref 12.3–15.4)
WBC: 6.5 10*3/uL (ref 3.4–10.8)

## 2017-12-06 NOTE — Progress Notes (Signed)
BP 120/72 (BP Location: Left Arm, Patient Position: Sitting, Cuff Size: Normal)   Pulse 76   Temp 98.2 F (36.8 C) (Oral)   Ht 5\' 6"  (1.676 m)   Wt 155 lb 3.2 oz (70.4 kg)   SpO2 98%   BMI 25.05 kg/m    Subjective:    Patient ID: Katherine [name], female    DOB: 1940/12/12, 77 y.o.   MRN: [id number]  HPI: Katherine [name] is a 77 y.o. female  Chief Complaint  Patient presents with  . Hospitalization Follow-up    From 12/03/2017. Still has fatigue and diarrhea.   . Diverticulitis  . Fatigue  . Diarrhea   Patient presents today for ER f/u for diverticulitis. Feeling some better, still very fatigued, having some lower abdominal tenderness, and having watery diarrhea. Tolerating PO intake well. No fevers, sweats, bloody stools. Not having to take the pain medication, just taking her antibiotics which she is tolerating well. ER notes reviewed, including labs which were singificant for an elevated WBC count of 11.1 and CT abdomen pelvis showing uncomplicated diverticulitis.   Relevant past medical, surgical, family and social history reviewed and updated as indicated. Interim medical history since our last visit reviewed. Allergies and medications reviewed and updated.  Review of Systems  Per HPI unless specifically indicated above     Objective:    BP 120/72 (BP Location: Left Arm, Patient Position: Sitting, Cuff Size: Normal)   Pulse 76   Temp 98.2 F (36.8 C) (Oral)   Ht 5\' 6"  (1.676 m)   Wt 155 lb 3.2 oz (70.4 kg)   SpO2 98%   BMI 25.05 kg/m   Wt Readings from Last 3 Encounters:  12/06/17 155 lb 3.2 oz (70.4 kg)  12/03/17 154 lb (69.9 kg)  10/07/17 154 lb 1.6 oz (69.9 kg)    Physical Exam  Constitutional: She is oriented to person, place, and time. She appears well-developed and well-nourished. No distress.  HENT:  Head: Atraumatic.  Eyes: Conjunctivae and EOM are normal.  Neck: Normal range of motion. Neck supple.  Cardiovascular: Normal rate and regular  rhythm.  Pulmonary/Chest: Effort normal and breath sounds normal.  Abdominal: Soft. Bowel sounds are normal. There is tenderness (mild ttp, particularly RLQ).  Neurological: She is alert and oriented to person, place, and time.  Skin: Skin is warm and dry.  Psychiatric: She has a normal mood and affect. Her behavior is normal.  Nursing note and vitals reviewed.   Results for orders placed or performed in visit on 12/06/17  CBC With Differential/Platelet  Result Value Ref Range   WBC 6.5 3.4 - 10.8 x10E3/uL   RBC 4.45 3.77 - 5.28 x10E6/uL   Hemoglobin 15.2 11.1 - 15.9 g/dL   Hematocrit 43.2 34.0 - 46.6 %   MCV 97 79 - 97 fL   MCH 34.2 (H) 26.6 - 33.0 pg   MCHC 35.2 31.5 - 35.7 g/dL   RDW 12.9 12.3 - 15.4 %   Platelets 134 (L) 150 - 450 x10E3/uL   Neutrophils 19 Not Estab. %   Lymphs 74 Not Estab. %   MID 8 Not Estab. %   Neutrophils Absolute 1.2 (L) 1.4 - 7.0 x10E3/uL   Lymphocytes Absolute 4.8 (H) 0.7 - 3.1 x10E3/uL   MID (Absolute) 0.5 0.1 - 1.6 X10E3/uL      Assessment & Plan:   Problem List Items Addressed This Visit    None    Visit Diagnoses    Diverticulitis    -  Primary   Improving, will recheck CBC today. Complete abx, start probiotics. BRAT diet, push fluids. Strict return precautions reviewed   Relevant Orders   CBC With Differential/Platelet (Completed)   Diarrhea of infectious origin       Improving some with abx. Strict return precautions reviewed for bloody stools, worsening abdominal pain, fevers. Push fluids, bland diet, probiotics   Lower abdominal pain       Improving. Complete abx, bland diet, push fluids       Follow up plan: Return for as scheduled.

## 2017-12-06 NOTE — Patient Instructions (Signed)
Follow up as scheduled.  

## 2017-12-22 ENCOUNTER — Other Ambulatory Visit: Payer: Self-pay | Admitting: *Deleted

## 2017-12-22 DIAGNOSIS — C9110 Chronic lymphocytic leukemia of B-cell type not having achieved remission: Secondary | ICD-10-CM

## 2017-12-25 NOTE — Progress Notes (Signed)
[place]  [phone] [phone]  ID: Katherine [name] OB: [birth year]  MR#: [id number]  TGY#:[id number]  Patient Care Team: [name], MD as PCP - General (Family Medicine) [name], MD as PCP - Family Medicine (Family Medicine) Forest Gleason, MD (Inactive) (Oncology) Oneta Rack, MD (Dermatology) Manya Silvas, MD (Gastroenterology) Bary Castilla Forest Gleason, MD (General Surgery) Lloyd Huger, MD as Medical Oncologist (Hematology and Oncology)  CHIEF COMPLAINT: CLL  INTERVAL HISTORY: Patient returns to clinic today for repeat laboratory work and further evaluation.  She recently had a bout of diverticulitis, but this has resolved and she feels back to her baseline.  She currently feels well and is asymptomatic.  She does not complain of weakness or fatigue today.  She denies any fevers, weight loss, or night sweats. She has no neurologic complaints. She has no chest pain or shortness of breath. She denies any nausea, vomiting, diarrhea, or constipation.  She denies any further abdominal pain.  She has no urinary complaints.  Patient feels at her baseline offers no specific complaints today.  REVIEW OF SYSTEMS:   Review of Systems  Constitutional: Negative.  Negative for fever, malaise/fatigue and weight loss.  HENT: Negative.   Respiratory: Negative.  Negative for cough and shortness of breath.   Cardiovascular: Negative.  Negative for chest pain and leg swelling.  Gastrointestinal: Negative.  Negative for blood in stool, constipation, diarrhea, nausea and vomiting.  Genitourinary: Negative.  Negative for frequency, hematuria and urgency.  Musculoskeletal: Negative.   Skin: Negative.  Negative for rash.  Neurological: Negative.  Negative for tingling, sensory change, focal weakness, weakness and headaches.  Psychiatric/Behavioral: Negative.  The patient is not nervous/anxious and does not have insomnia.     As per  HPI. Otherwise, a complete review of systems is negative.  PAST MEDICAL HISTORY: Past Medical History:  Diagnosis Date  . Allergy   . Anxiety   . Depression   . GERD (gastroesophageal reflux disease)   . Hemorrhoids   . Leukemia, lymphoid ([place])    CLL  . Lobar pneumonia ([place])   . Osteopenia   . Personal history of chemotherapy     PAST SURGICAL HISTORY: Past Surgical History:  Procedure Laterality Date  . ABDOMINAL HYSTERECTOMY    . APPENDECTOMY    . BREAST BIOPSY Left    bx x 3-neg  . COLON SURGERY     sigmoid resection  . COLONOSCOPY     2007, 2012  . COLONOSCOPY WITH PROPOFOL N/A 09/17/2015   Procedure: COLONOSCOPY WITH PROPOFOL;  Surgeon: Robert Bellow, MD;  Location: [facility] ENDOSCOPY;  Service: Endoscopy;  Laterality: N/A;  . SPINE SURGERY     L4-5    FAMILY HISTORY Family History  Problem Relation Age of Onset  . Osteoporosis Mother   . Hypertension Father   . Heart attack Father   . Stroke Maternal Grandfather   . Breast cancer Sister 53       ADVANCED DIRECTIVES:    HEALTH MAINTENANCE: Social History   Tobacco Use  . Smoking status: Current Every Day Smoker    Packs/day: 1.00    Years: 35.00    Pack years: 35.00    Types: Cigarettes  . Smokeless tobacco: Never Used  Substance Use Topics  . Alcohol use: No    Alcohol/week: 0.0 standard drinks  . Drug use: No     Allergies  Allergen Reactions  . Augmentin [Amoxicillin-Pot Clavulanate] Swelling    tongue  .  Biaxin [Clarithromycin] Swelling and Rash    tongue    Current Outpatient Medications  Medication Sig Dispense Refill  . aspirin EC 81 MG tablet Take 81 mg by mouth every other day.     Marland Kitchen atorvastatin (LIPITOR) 10 MG tablet Take 1 tablet (10 mg total) by mouth daily. 90 tablet 1  . augmented betamethasone dipropionate (DIPROLENE AF) 0.05 % cream Apply topically 2 (two) times daily. 30 g 0  . benazepril (LOTENSIN) 40 MG tablet Take 1 tablet (40 mg total) by mouth daily. 90 tablet 1   . diphenhydrAMINE (BENADRYL) 25 MG tablet Take 25 mg by mouth at bedtime as needed.    . fexofenadine (ALLEGRA) 180 MG tablet Take 180 mg by mouth daily. As needed    . LORazepam (ATIVAN) 1 MG tablet Take 0.5 tablets (0.5 mg total) by mouth 2 (two) times daily as needed for anxiety. 60 tablet 2  . Multiple Vitamins-Minerals (MULTIVITAMIN WITH MINERALS) tablet Take 1 tablet by mouth daily.    Marland Kitchen omeprazole (PRILOSEC) 20 MG capsule Take 1 capsule (20 mg total) daily as needed by mouth. 90 capsule 4  . PARoxetine (PAXIL) 30 MG tablet Take 1 tablet (30 mg total) by mouth daily. 90 tablet 1  . simethicone (MYLICON) 80 MG chewable tablet Chew 1 tablet (80 mg total) by mouth every 6 (six) hours as needed for flatulence. 30 tablet 0  . Triamcinolone Acetonide (NASACORT AQ NA) Place 2 Doses into the nose daily as needed.      No current facility-administered medications for this visit.     OBJECTIVE: Vitals:   12/30/17 1115  BP: 118/74  Pulse: 77  Resp: 20  Temp: 98.2 F (36.8 C)     Body mass index is 24.86 kg/m.    ECOG FS:0 - Asymptomatic  General: Well-developed, well-nourished, no acute distress. Eyes: Pink conjunctiva, anicteric sclera. HEENT: Normocephalic, moist mucous membranes. Lungs: Clear to auscultation bilaterally. Heart: Regular rate and rhythm. No rubs, murmurs, or gallops. Abdomen: Soft, nontender, nondistended. No organomegaly noted, normoactive bowel sounds. Musculoskeletal: No edema, cyanosis, or clubbing. Neuro: Alert, answering all questions appropriately. Cranial nerves grossly intact. Skin: No rashes or petechiae noted. Psych: Normal affect. Lymphatics: No cervical, calvicular, axillary or inguinal LAD.  LAB RESULTS:  Lab Results  Component Value Date   NA 142 12/03/2017   K 3.9 12/03/2017   CL 106 12/03/2017   CO2 27 12/03/2017   GLUCOSE 118 (H) 12/03/2017   BUN 7 (L) 12/03/2017   CREATININE 0.67 12/03/2017   CALCIUM 9.4 12/03/2017   PROT 7.0 12/03/2017    ALBUMIN 4.4 12/03/2017   AST 18 12/03/2017   ALT 12 12/03/2017   ALKPHOS 66 12/03/2017   BILITOT 1.0 12/03/2017   GFRNONAA >60 12/03/2017   GFRAA >60 12/03/2017    Lab Results  Component Value Date   WBC 16.6 (H) 12/30/2017   NEUTROABS 2.6 12/30/2017   HGB 14.8 12/30/2017   HCT 43.6 12/30/2017   MCV 97.3 12/30/2017   PLT 141 (L) 12/30/2017     STUDIES: Ct Abdomen Pelvis W Contrast  Result Date: 12/03/2017 CLINICAL DATA:  Left lower quadrant pain and bloating for 3 days. Diverticulitis suspected. EXAM: CT ABDOMEN AND PELVIS WITH CONTRAST TECHNIQUE: Multidetector CT imaging of the abdomen and pelvis was performed using the standard protocol following bolus administration of intravenous contrast. CONTRAST:  167mL ISOVUE-300 IOPAMIDOL (ISOVUE-300) INJECTION 61% COMPARISON:  11/24/2016 plain films.  CT 05/07/2004 FINDINGS: Lower chest: Motion degradation. Anterior right lung base  scarring or atelectasis. Normal heart size without pericardial or pleural effusion. Right coronary artery atherosclerosis. Fat containing bilateral Bochdalek's hernias. Hepatobiliary: Normal liver. Multiple small gallstones without acute cholecystitis or biliary duct dilatation. Pancreas: Normal, without mass or ductal dilatation. Spleen: Normal in size. Subcentimeter low-density lesion is of doubtful clinical significance. Adrenals/Urinary Tract: Normal adrenal glands. Normal kidneys, without hydronephrosis. Normal urinary bladder. Stomach/Bowel: Mild motion degradation continuing into the upper abdomen. Extensive colonic diverticulosis. Surgical changes in the sigmoid. Just distal to the surgical sutures, there is wall thickening and pericolonic inflammation which is relatively mild, including on image 58/2. No pericolonic abscess or free perforation. Normal terminal ileum. Surgical sutures in the distal ileum. Vascular/Lymphatic: Advanced aortic and branch vessel atherosclerosis. No abdominopelvic adenopathy.  Reproductive: Hysterectomy. Other: No significant free fluid.  Moderate pelvic floor laxity. Musculoskeletal: Fat containing left paramidline diminutive ventral abdominal wall hernia. Heterotopic ossification about the anterior right hip. Convex right lumbar spine curvature. Lumbosacral spondylosis. IMPRESSION: 1. Non complicated sigmoid diverticulitis. 2. Cholelithiasis. 3. Coronary artery atherosclerosis. Aortic Atherosclerosis (ICD10-I70.0). 4. Mild motion degradation within the lower chest and upper abdomen. 5. Pelvic floor laxity. Electronically Signed   By: Abigail Miyamoto M.D.   On: 12/03/2017 12:41    ASSESSMENT: CLL, Rai stage 0  PLAN:    1. CLL: Patient initially completed 4 cycles of weekly Rituxan on Jul 18, 2015.  She was recently retreated and completed 4 additional weekly treatments on Jul 01, 2017.  Her white blood cell count has trended up slightly to 16.6, but this may be reactive secondary to her recent bout of diverticulitis.  Although her lymphocytes have increased as well, so will continue to monitor closely. No intervention is needed at this time.  Return to clinic in 3 months with repeat laboratory work and further evaluation. 2.  Thrombocytopenia: Improving, monitor. 3.  Diverticulitis: Resolved. 4.  Tobacco abuse: CT lung cancer screening on February 02, 2018 reported a Lungs-RADS 2-S.  Repeat in 1 year.  Patient expressed understanding and was in agreement with this plan. She also understands that She can call clinic at any time with any questions, concerns, or complaints.    Lloyd Huger, MD 12/30/17 2:16 PM

## 2017-12-27 ENCOUNTER — Other Ambulatory Visit: Payer: Self-pay | Admitting: *Deleted

## 2017-12-27 DIAGNOSIS — C9110 Chronic lymphocytic leukemia of B-cell type not having achieved remission: Secondary | ICD-10-CM

## 2017-12-30 ENCOUNTER — Inpatient Hospital Stay: Payer: Medicare HMO

## 2017-12-30 ENCOUNTER — Inpatient Hospital Stay: Payer: Medicare HMO | Attending: Oncology | Admitting: Oncology

## 2017-12-30 ENCOUNTER — Encounter: Payer: Self-pay | Admitting: Oncology

## 2017-12-30 VITALS — BP 118/74 | HR 77 | Temp 98.2°F | Resp 20 | Wt 154.0 lb

## 2017-12-30 DIAGNOSIS — C9110 Chronic lymphocytic leukemia of B-cell type not having achieved remission: Secondary | ICD-10-CM

## 2017-12-30 DIAGNOSIS — Z90710 Acquired absence of both cervix and uterus: Secondary | ICD-10-CM | POA: Diagnosis not present

## 2017-12-30 DIAGNOSIS — Z7982 Long term (current) use of aspirin: Secondary | ICD-10-CM | POA: Insufficient documentation

## 2017-12-30 DIAGNOSIS — Z803 Family history of malignant neoplasm of breast: Secondary | ICD-10-CM | POA: Diagnosis not present

## 2017-12-30 DIAGNOSIS — F17210 Nicotine dependence, cigarettes, uncomplicated: Secondary | ICD-10-CM

## 2017-12-30 DIAGNOSIS — Z79899 Other long term (current) drug therapy: Secondary | ICD-10-CM

## 2017-12-30 DIAGNOSIS — R69 Illness, unspecified: Secondary | ICD-10-CM | POA: Diagnosis not present

## 2017-12-30 DIAGNOSIS — D696 Thrombocytopenia, unspecified: Secondary | ICD-10-CM

## 2017-12-30 LAB — CBC WITH DIFFERENTIAL/PLATELET
Abs Immature Granulocytes: 0.02 10*3/uL (ref 0.00–0.07)
Basophils Absolute: 0.1 10*3/uL (ref 0.0–0.1)
Basophils Relative: 0 %
Eosinophils Absolute: 0.2 10*3/uL (ref 0.0–0.5)
Eosinophils Relative: 1 %
HCT: 43.6 % (ref 36.0–46.0)
Hemoglobin: 14.8 g/dL (ref 12.0–15.0)
Immature Granulocytes: 0 %
Lymphocytes Relative: 76 %
Lymphs Abs: 12.6 10*3/uL — ABNORMAL HIGH (ref 0.7–4.0)
MCH: 33 pg (ref 26.0–34.0)
MCHC: 33.9 g/dL (ref 30.0–36.0)
MCV: 97.3 fL (ref 80.0–100.0)
Monocytes Absolute: 1.2 10*3/uL — ABNORMAL HIGH (ref 0.1–1.0)
Monocytes Relative: 7 %
Neutro Abs: 2.6 10*3/uL (ref 1.7–7.7)
Neutrophils Relative %: 16 %
Platelets: 141 10*3/uL — ABNORMAL LOW (ref 150–400)
RBC: 4.48 MIL/uL (ref 3.87–5.11)
RDW: 12.6 % (ref 11.5–15.5)
WBC: 16.6 10*3/uL — ABNORMAL HIGH (ref 4.0–10.5)
nRBC: 0 % (ref 0.0–0.2)

## 2017-12-30 NOTE — Progress Notes (Signed)
Patient denies any concerns today.  

## 2018-01-09 ENCOUNTER — Encounter: Payer: Self-pay | Admitting: Family Medicine

## 2018-01-09 ENCOUNTER — Ambulatory Visit (INDEPENDENT_AMBULATORY_CARE_PROVIDER_SITE_OTHER): Payer: Medicare HMO | Admitting: Family Medicine

## 2018-01-09 VITALS — BP 122/73 | HR 87 | Temp 98.1°F | Ht 66.54 in | Wt 152.0 lb

## 2018-01-09 DIAGNOSIS — C9110 Chronic lymphocytic leukemia of B-cell type not having achieved remission: Secondary | ICD-10-CM

## 2018-01-09 DIAGNOSIS — F3342 Major depressive disorder, recurrent, in full remission: Secondary | ICD-10-CM

## 2018-01-09 DIAGNOSIS — E7800 Pure hypercholesterolemia, unspecified: Secondary | ICD-10-CM

## 2018-01-09 DIAGNOSIS — Z1329 Encounter for screening for other suspected endocrine disorder: Secondary | ICD-10-CM | POA: Diagnosis not present

## 2018-01-09 DIAGNOSIS — F419 Anxiety disorder, unspecified: Secondary | ICD-10-CM

## 2018-01-09 DIAGNOSIS — Z7189 Other specified counseling: Secondary | ICD-10-CM

## 2018-01-09 DIAGNOSIS — I10 Essential (primary) hypertension: Secondary | ICD-10-CM

## 2018-01-09 DIAGNOSIS — R69 Illness, unspecified: Secondary | ICD-10-CM | POA: Diagnosis not present

## 2018-01-09 DIAGNOSIS — I700 Atherosclerosis of aorta: Secondary | ICD-10-CM

## 2018-01-09 MED ORDER — LORAZEPAM 1 MG PO TABS: 0.5000 mg | ORAL_TABLET | Freq: Two times a day (BID) | ORAL | 5 refills | Status: DC | PRN

## 2018-01-09 MED ORDER — PAROXETINE HCL 30 MG PO TABS: 30.0000 mg | ORAL_TABLET | Freq: Every day | ORAL | 4 refills | Status: DC

## 2018-01-09 MED ORDER — OMEPRAZOLE 20 MG PO CPDR: 20.0000 mg | DELAYED_RELEASE_CAPSULE | Freq: Every day | ORAL | 4 refills | Status: DC | PRN

## 2018-01-09 MED ORDER — BENAZEPRIL HCL 40 MG PO TABS: 40.0000 mg | ORAL_TABLET | Freq: Every day | ORAL | 4 refills | Status: DC

## 2018-01-09 MED ORDER — ATORVASTATIN CALCIUM 10 MG PO TABS: 10.0000 mg | ORAL_TABLET | Freq: Every day | ORAL | 4 refills | Status: DC

## 2018-01-09 NOTE — Assessment & Plan Note (Signed)
The current medical regimen is effective;  continue present plan and medications.  

## 2018-01-09 NOTE — Addendum Note (Signed)
Addended by: Golden Pop A on: 01/09/2018 09:44 AM   Modules accepted: Orders

## 2018-01-09 NOTE — Assessment & Plan Note (Signed)
Stable taking cholesterol medication

## 2018-01-09 NOTE — Assessment & Plan Note (Signed)
Chronic anxiety treated with lorazepam 1 mg half tablet twice a day has been taking without problems or changing dosing for years will continue current dosing and medication.

## 2018-01-09 NOTE — Progress Notes (Signed)
BP 122/73   Pulse 87   Temp 98.1 F (36.7 C) (Oral)   Ht 5' 6.54" (1.69 m)   Wt 152 lb (68.9 kg)   SpO2 98%   BMI 24.14 kg/m    Subjective:    Patient ID: Katherine [name], female    DOB: [birth year], 77 y.o.   MRN: [id number]  HPI: Katherine [name] is a 77 y.o. female  Chief Complaint  Patient presents with  . Annual Exam  Patient with CLL followed by the cancer center and having good reports.  Reviewed lab work from patient's [place] this fall and of all been normal with thyroid CBC CMP and urinalysis. Lipid panel has not been done which will be drawn today. Patient recovered from diverticulitis no further symptoms has been doing well.  Chronic anxiety stable taking lorazepam 1 mg half a tablet morning and half tablet in the evening.  This does well to control her chronic anxiety.  Usage has not increased or decreased over the years. Takes Lipitor for cholesterol without problems. Depression doing well with Paxil no complaints. Benzapril for blood pressure doing well also. Takes Prilosec on a as needed basis without problems.  Relevant past medical, surgical, family and social history reviewed and updated as indicated. Interim medical history since our last visit reviewed. Allergies and medications reviewed and updated.  Review of Systems  Constitutional: Negative.   HENT: Negative.   Eyes: Negative.   Respiratory: Negative.   Cardiovascular: Negative.   Gastrointestinal: Negative.   Endocrine: Negative.   Genitourinary: Negative.   Musculoskeletal: Negative.   Skin: Negative.   Allergic/Immunologic: Negative.   Neurological: Negative.   Hematological: Negative.   Psychiatric/Behavioral: Negative.     Per HPI unless specifically indicated above     Objective:    BP 122/73   Pulse 87   Temp 98.1 F (36.7 C) (Oral)   Ht 5' 6.54" (1.69 m)   Wt 152 lb (68.9 kg)   SpO2 98%   BMI 24.14 kg/m   Wt Readings from Last 3 Encounters:  01/09/18 152 lb (68.9  kg)  12/30/17 154 lb (69.9 kg)  12/06/17 155 lb 3.2 oz (70.4 kg)    Physical Exam  Constitutional: She is oriented to person, place, and time. She appears well-developed and well-nourished.  HENT:  Head: Normocephalic and atraumatic.  Right Ear: External ear normal.  Left Ear: External ear normal.  Nose: Nose normal.  Mouth/Throat: Oropharynx is clear and moist.  Eyes: Pupils are equal, round, and reactive to light. Conjunctivae and EOM are normal.  Neck: Normal range of motion. Neck supple. Carotid bruit is not present.  Cardiovascular: Normal rate, regular rhythm and normal heart sounds.  No murmur heard. Pulmonary/Chest: Effort normal and breath sounds normal. She exhibits no mass. Right breast exhibits no mass, no skin change and no tenderness. Left breast exhibits no mass, no skin change and no tenderness. Breasts are symmetrical.  Abdominal: Soft. Bowel sounds are normal. There is no hepatosplenomegaly.  Musculoskeletal: Normal range of motion.  Neurological: She is alert and oriented to person, place, and time.  Skin: No rash noted.  Psychiatric: She has a normal mood and affect. Her behavior is normal. Judgment and thought content normal.    Results for orders placed or performed in visit on 12/30/17  CBC with Differential/Platelet  Result Value Ref Range   WBC 16.6 (H) 4.0 - 10.5 K/uL   RBC 4.48 3.87 - 5.11 MIL/uL   Hemoglobin 14.8 12.0 -  15.0 g/dL   HCT 43.6 36.0 - 46.0 %   MCV 97.3 80.0 - 100.0 fL   MCH 33.0 26.0 - 34.0 pg   MCHC 33.9 30.0 - 36.0 g/dL   RDW 12.6 11.5 - 15.5 %   Platelets 141 (L) 150 - 400 K/uL   nRBC 0.0 0.0 - 0.2 %   Neutrophils Relative % 16 %   Neutro Abs 2.6 1.7 - 7.7 K/uL   Lymphocytes Relative 76 %   Lymphs Abs 12.6 (H) 0.7 - 4.0 K/uL   Monocytes Relative 7 %   Monocytes Absolute 1.2 (H) 0.1 - 1.0 K/uL   Eosinophils Relative 1 %   Eosinophils Absolute 0.2 0.0 - 0.5 K/uL   Basophils Relative 0 %   Basophils Absolute 0.1 0.0 - 0.1 K/uL    Immature Granulocytes 0 %   Abs Immature Granulocytes 0.02 0.00 - 0.07 K/uL  IgVH Somatic Hypermutation  Result Value Ref Range   Interpretation: PENDING    INDICATION FOR STUDY: PENDING    SPECIMEN TYPE: PENDING    COMMENTS: PENDING    DETECTION PARAMETERS: PENDING    RESULT: PENDING    ELECTONICALLY SIGNED BY: PENDING    METHODOLOGY: Comment    INTENDED USE: Comment    REFERENCES: Comment    DISCLAIMER: Comment    Specimen Status Comment       Assessment & Plan:   Problem List Items Addressed This Visit      Cardiovascular and Mediastinum   Essential hypertension - Primary    The current medical regimen is effective;  continue present plan and medications.       Relevant Orders   Lipid panel   Aortic atherosclerosis ([place])    Stable taking cholesterol medication        Other   Depression    The current medical regimen is effective;  continue present plan and medications.       CLL (chronic lymphocytic leukemia) (HCC)    The current medical regimen is effective;  continue present plan and medications.       Hypercholesterolemia    The current medical regimen is effective;  continue present plan and medications.       Relevant Orders   Lipid panel   Anxiety    Chronic anxiety treated with lorazepam 1 mg half tablet twice a day has been taking without problems or changing dosing for years will continue current dosing and medication.      Advanced care planning/counseling discussion    A voluntary discussion about advanced care planning including explanation and discussion of advanced directives was extentively discussed with the patient.  Explained about the healthcare proxy and living will was reviewed and packet with forms with expiration of how to fill them out was given.  Time spent: Encounter 16+ min individuals present: Patient       Other Visit Diagnoses    Thyroid disorder screen           Follow up plan: Return in about 6 months (around  07/10/2018) for BMP,  Lipids, ALT, AST.

## 2018-01-09 NOTE — Assessment & Plan Note (Signed)
A voluntary discussion about advanced care planning including explanation and discussion of advanced directives was extentively discussed with the patient.  Explained about the healthcare proxy and living will was reviewed and packet with forms with expiration of how to fill them out was given.  Time spent: Encounter 16+ min individuals present: Patient 

## 2018-01-10 ENCOUNTER — Encounter: Payer: Self-pay | Admitting: Family Medicine

## 2018-01-10 LAB — LIPID PANEL
Chol/HDL Ratio: 3.4 ratio (ref 0.0–4.4)
Cholesterol, Total: 134 mg/dL (ref 100–199)
HDL: 39 mg/dL — ABNORMAL LOW (ref >39)
LDL Calculated: 54 mg/dL (ref 0–99)
Triglycerides: 203 mg/dL — ABNORMAL HIGH (ref 0–149)
VLDL Cholesterol Cal: 41 mg/dL — ABNORMAL HIGH (ref 5–40)

## 2018-01-11 ENCOUNTER — Ambulatory Visit
Admission: RE | Admit: 2018-01-11 | Discharge: 2018-01-11 | Disposition: A | Discharge status: Home / Self Care | Payer: Medicare HMO | Source: Ambulatory Visit | Attending: Family Medicine | Admitting: Family Medicine

## 2018-01-11 DIAGNOSIS — Z1231 Encounter for screening mammogram for malignant neoplasm of breast: Secondary | ICD-10-CM | POA: Diagnosis not present

## 2018-01-12 LAB — FISH HES LEUKEMIA, 4Q12 REA

## 2018-01-20 ENCOUNTER — Telehealth: Payer: Self-pay

## 2018-01-20 NOTE — Telephone Encounter (Signed)
Call pt regarding lung screening. Left message. 

## 2018-01-25 ENCOUNTER — Telehealth: Payer: Self-pay | Admitting: *Deleted

## 2018-01-25 NOTE — Telephone Encounter (Signed)
Attempted to leave message for patient to notify them that it is time to schedule annual low dose lung cancer screening CT scan. However, there is no voicemail option available.

## 2018-01-28 ENCOUNTER — Telehealth: Payer: Self-pay

## 2018-01-28 NOTE — Telephone Encounter (Signed)
Call pt regarding lung screening. Left message with husband.

## 2018-01-30 ENCOUNTER — Telehealth: Payer: Self-pay | Admitting: *Deleted

## 2018-01-30 DIAGNOSIS — Z87891 Personal history of nicotine dependence: Secondary | ICD-10-CM

## 2018-01-30 DIAGNOSIS — Z122 Encounter for screening for malignant neoplasm of respiratory organs: Secondary | ICD-10-CM

## 2018-01-30 NOTE — Telephone Encounter (Signed)
Patient has been notified that annual lung cancer screening low dose CT scan is due currently or will be in near future. Confirmed that patient is within the age range of 55-77, and asymptomatic, (no signs or symptoms of lung cancer). Patient denies illness that would prevent curative treatment for lung cancer if found. Verified smoking history, (current, 36 pack year). The shared decision making visit was done 02/02/17. Patient is agreeable for CT scan being scheduled.

## 2018-02-02 ENCOUNTER — Ambulatory Visit
Admission: RE | Admit: 2018-02-02 | Discharge: 2018-02-02 | Disposition: A | Discharge status: Home / Self Care | Payer: Medicare HMO | Source: Ambulatory Visit | Attending: Oncology | Admitting: Oncology

## 2018-02-02 DIAGNOSIS — Z122 Encounter for screening for malignant neoplasm of respiratory organs: Secondary | ICD-10-CM

## 2018-02-02 DIAGNOSIS — Z87891 Personal history of nicotine dependence: Secondary | ICD-10-CM | POA: Diagnosis not present

## 2018-02-02 DIAGNOSIS — R69 Illness, unspecified: Secondary | ICD-10-CM | POA: Diagnosis not present

## 2018-02-03 ENCOUNTER — Telehealth: Payer: Self-pay | Admitting: *Deleted

## 2018-02-03 ENCOUNTER — Encounter: Payer: Self-pay | Admitting: *Deleted

## 2018-02-03 NOTE — Telephone Encounter (Signed)
Notified patient of LDCT lung cancer screening program results with recommendation for 3 month follow up imaging. Also notified of incidental findings noted below and is encouraged to discuss further with PCP who will receive a copy of this note and/or the CT report. Patient verbalizes understanding.   IMPRESSION: 1. New endobronchial lesion in the segmental bronchus to the medial segment of the right middle lobe. This may simply reflect an area of retained secretions, however, the possibility of an endobronchial neoplasm should be considered. Lung-RADS 4AS, suspicious. Follow up low-dose chest CT without contrast in 3 months (please use the following order, "CT CHEST LCS NODULE FOLLOW-UP W/O CM") is recommended. Alternatively, PET may be considered when there is a solid component 63mm or larger. 2. The "S" modifier above refers to potentially clinically significant non lung cancer related findings. Specifically, there is aortic atherosclerosis, in addition to 3 vessel coronary artery disease. Please note that although the presence of coronary artery calcium documents the presence of coronary artery disease, the severity of this disease and any potential stenosis cannot be assessed on this non-gated CT examination. Assessment for potential risk factor modification, dietary therapy or pharmacologic therapy may be warranted, if clinically indicated. 3. Mild diffuse bronchial wall thickening with mild centrilobular and paraseptal emphysema; imaging findings suggestive of underlying COPD.  These results will be called to the ordering clinician or representative by the Radiologist Assistant, and communication documented in the PACS or zVision Dashboard.  Aortic Atherosclerosis (ICD10-I70.0) and Emphysema (ICD10-J43.9).

## 2018-02-05 ENCOUNTER — Encounter: Payer: Self-pay | Admitting: Family Medicine

## 2018-02-05 DIAGNOSIS — J439 Emphysema, unspecified: Secondary | ICD-10-CM | POA: Insufficient documentation

## 2018-02-05 DIAGNOSIS — J432 Centrilobular emphysema: Secondary | ICD-10-CM | POA: Insufficient documentation

## 2018-02-05 NOTE — Telephone Encounter (Signed)
Call pt 

## 2018-02-06 NOTE — Telephone Encounter (Signed)
Phone call Discussed with patient CT report requesting follow-up in 3 months patient is aware of this request and is already going to comply.

## 2018-02-06 NOTE — Telephone Encounter (Signed)
Patient was transferred to provider for telephone conversation.   

## 2018-02-16 ENCOUNTER — Ambulatory Visit (INDEPENDENT_AMBULATORY_CARE_PROVIDER_SITE_OTHER): Payer: Medicare HMO

## 2018-02-16 VITALS — BP 122/78 | HR 86 | Temp 98.6°F | Ht 67.0 in | Wt 153.0 lb

## 2018-02-16 DIAGNOSIS — Z0000 Encounter for general adult medical examination without abnormal findings: Secondary | ICD-10-CM | POA: Diagnosis not present

## 2018-02-16 NOTE — Patient Instructions (Addendum)
Katherine [name] , Thank you for taking time to come for your Medicare Wellness Visit. I appreciate your ongoing commitment to your health goals. Please review the following plan we discussed and let me know if I can assist you in the future.   Screening recommendations/referrals: Colonoscopy excluded, over age 77 Mammogram up to date, due 01/12/2019 Bone Density please get with next mammogram Recommended yearly ophthalmology/optometry visit for glaucoma screening and checkup Recommended yearly dental visit for hygiene and checkup  Vaccinations: Influenza vaccine up to date Pneumococcal vaccine up to date, completed Tdap vaccine up to date, due 11/17/2020 Shingles vaccine up to date. Please get next shot in 3-6 months  Advanced directives:Please bring [place] a copy for your records  Conditions/risks identified: none  Next appointment: [name] 07/11/2018 @ 10:30am            Medicare Wellness Visit 02/19/2019 @ 11am   Preventive Care 65 Years and Older, Female Preventive care refers to lifestyle choices and visits with your health care provider that can promote health and wellness. What does preventive care include?  A yearly physical exam. This is also called an annual well check.  Dental exams once or twice a year.  Routine eye exams. Ask your health care provider how often you should have your eyes checked.  Personal lifestyle choices, including:  Daily care of your teeth and gums.  Regular physical activity.  Eating a healthy diet.  Avoiding tobacco and drug use.  Limiting alcohol use.  Practicing safe sex.  Taking low-dose aspirin every day.  Taking vitamin and mineral supplements as recommended by your health care provider. What happens during an annual well check? The services and screenings done by your health care provider during your annual well check will depend on your age, overall health, lifestyle risk factors, and family history of disease. Counseling  Your  health care provider may ask you questions about your:  Alcohol use.  Tobacco use.  Drug use.  Emotional well-being.  Home and relationship well-being.  Sexual activity.  Eating habits.  History of falls.  Memory and ability to understand (cognition).  Work and work Statistician.  Reproductive health. Screening  You may have the following tests or measurements:  Height, weight, and BMI.  Blood pressure.  Lipid and cholesterol levels. These may be checked every 5 years, or more frequently if you are over 27 years old.  Skin check.  Lung cancer screening. You may have this screening every year starting at age 32 if you have a 30-pack-year history of smoking and currently smoke or have quit within the past 15 years.  Fecal occult blood test (FOBT) of the stool. You may have this test every year starting at age 2.  Flexible sigmoidoscopy or colonoscopy. You may have a sigmoidoscopy every 5 years or a colonoscopy every 10 years starting at age 68.  Hepatitis C blood test.  Hepatitis B blood test.  Sexually transmitted disease (STD) testing.  Diabetes screening. This is done by checking your blood sugar (glucose) after you have not eaten for a while (fasting). You may have this done every 1-3 years.  Bone density scan. This is done to screen for osteoporosis. You may have this done starting at age 20.  Mammogram. This may be done every 1-2 years. Talk to your health care provider about how often you should have regular mammograms. Talk with your health care provider about your test results, treatment options, and if necessary, the need for more tests. Vaccines  Your health care provider may recommend certain vaccines, such as:  Influenza vaccine. This is recommended every year.  Tetanus, diphtheria, and acellular pertussis (Tdap, Td) vaccine. You may need a Td booster every 10 years.  Zoster vaccine. You may need this after age 17.  Pneumococcal 13-valent  conjugate (PCV13) vaccine. One dose is recommended after age 14.  Pneumococcal polysaccharide (PPSV23) vaccine. One dose is recommended after age 55. Talk to your health care provider about which screenings and vaccines you need and how often you need them. This information is not intended to replace advice given to you by your health care provider. Make sure you discuss any questions you have with your health care provider. Document Released: 03/07/2015 Document Revised: 10/29/2015 Document Reviewed: 12/10/2014 Elsevier Interactive Patient Education  2017 [place] Prevention in the Home Falls can cause injuries. They can happen to people of all ages. There are many things you can do to make your home safe and to help prevent falls. What can I do on the outside of my home?  Regularly fix the edges of walkways and driveways and fix any cracks.  Remove anything that might make you trip as you walk through a door, such as a raised step or threshold.  Trim any bushes or trees on the path to your home.  Use bright outdoor lighting.  Clear any walking paths of anything that might make someone trip, such as rocks or tools.  Regularly check to see if handrails are loose or broken. Make sure that both sides of any steps have handrails.  Any raised decks and porches should have guardrails on the edges.  Have any leaves, snow, or ice cleared regularly.  Use sand or salt on walking paths during winter.  Clean up any spills in your garage right away. This includes oil or grease spills. What can I do in the bathroom?  Use night lights.  Install grab bars by the toilet and in the tub and shower. Do not use towel bars as grab bars.  Use non-skid mats or decals in the tub or shower.  If you need to sit down in the shower, use a plastic, non-slip stool.  Keep the floor dry. Clean up any water that spills on the floor as soon as it happens.  Remove soap buildup in the tub or  shower regularly.  Attach bath mats securely with double-sided non-slip rug tape.  Do not have throw rugs and other things on the floor that can make you trip. What can I do in the bedroom?  Use night lights.  Make sure that you have a light by your bed that is easy to reach.  Do not use any sheets or blankets that are too big for your bed. They should not hang down onto the floor.  Have a firm chair that has side arms. You can use this for support while you get dressed.  Do not have throw rugs and other things on the floor that can make you trip. What can I do in the kitchen?  Clean up any spills right away.  Avoid walking on wet floors.  Keep items that you use a lot in easy-to-reach places.  If you need to reach something above you, use a strong step stool that has a grab bar.  Keep electrical cords out of the way.  Do not use floor polish or wax that makes floors slippery. If you must use wax, use non-skid floor wax.  Do  not have throw rugs and other things on the floor that can make you trip. What can I do with my stairs?  Do not leave any items on the stairs.  Make sure that there are handrails on both sides of the stairs and use them. Fix handrails that are broken or loose. Make sure that handrails are as long as the stairways.  Check any carpeting to make sure that it is firmly attached to the stairs. Fix any carpet that is loose or worn.  Avoid having throw rugs at the top or bottom of the stairs. If you do have throw rugs, attach them to the floor with carpet tape.  Make sure that you have a light switch at the top of the stairs and the bottom of the stairs. If you do not have them, ask someone to add them for you. What else can I do to help prevent falls?  Wear shoes that:  Do not have high heels.  Have rubber bottoms.  Are comfortable and fit you well.  Are closed at the toe. Do not wear sandals.  If you use a stepladder:  Make sure that it is fully  opened. Do not climb a closed stepladder.  Make sure that both sides of the stepladder are locked into place.  Ask someone to hold it for you, if possible.  Clearly mark and make sure that you can see:  Any grab bars or handrails.  First and last steps.  Where the edge of each step is.  Use tools that help you move around (mobility aids) if they are needed. These include:  Canes.  Walkers.  Scooters.  Crutches.  Turn on the lights when you go into a dark area. Replace any light bulbs as soon as they burn out.  Set up your furniture so you have a clear path. Avoid moving your furniture around.  If any of your floors are uneven, fix them.  If there are any pets around you, be aware of where they are.  Review your medicines with your doctor. Some medicines can make you feel dizzy. This can increase your chance of falling. Ask your doctor what other things that you can do to help prevent falls. This information is not intended to replace advice given to you by your health care provider. Make sure you discuss any questions you have with your health care provider. Document Released: 12/05/2008 Document Revised: 07/17/2015 Document Reviewed: 03/15/2014 Elsevier Interactive Patient Education  2017 [employer].

## 2018-02-16 NOTE — Progress Notes (Signed)
Subjective:   Katherine [name] is a 77 y.o. female who presents for Medicare Annual (Subsequent) preventive examination.    Objective:     Vitals: BP 122/78 (BP Location: Left Arm, Patient Position: Sitting)   Pulse 86   Temp 98.6 F (37 C) (Oral)   Ht 5\' 7"  (1.702 m)   Wt 153 lb (69.4 kg)   SpO2 92%   BMI 23.96 kg/m   Body mass index is 23.96 kg/m.  Advanced Directives 02/16/2018 12/03/2017 08/12/2017 06/10/2017 05/27/2017 01/24/2017 09/03/2016  Does Patient Have a Medical Advance Directive? Yes Yes Yes Yes - Yes Yes  Type of Advance Directive [place];Living will [place];Living will - [place];Living will Living will;Healthcare Power of [place];Living will [place];Living will  Does patient want to make changes to medical advance directive? No - Patient declined - No - Patient declined No - Patient declined - - No - Patient declined  Copy of [place] in Chart? No - copy requested No - copy requested - No - copy requested - No - copy requested -  Would patient like information on creating a medical advance directive? - - - - - - -    Tobacco Social History   Tobacco Use  Smoking Status Current Every Day Smoker  . Packs/day: 1.00  . Years: 35.00  . Pack years: 35.00  . Types: Cigarettes  Smokeless Tobacco Never Used     Ready to quit: Not Answered Counseling given: Not Answered   Clinical Intake:  Pre-visit preparation completed: No  Pain : No/denies pain     Nutritional Risks: None Diabetes: No  How often do you need to have someone help you when you read instructions, pamphlets, or other written materials from your doctor or pharmacy?: 1 - Never What is the last grade level you completed in school?: HS  Interpreter Needed?: No  Information entered by :: Tyson Dense, RN  Past Medical History:  Diagnosis Date  . Allergy   .  Anxiety   . Depression   . GERD (gastroesophageal reflux disease)   . Hemorrhoids   . 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 Lobar pneumonia ([place])   . Osteopenia   . Personal history of chemotherapy    Past Surgical History:  Procedure Laterality Date  . ABDOMINAL HYSTERECTOMY    . APPENDECTOMY    . BREAST BIOPSY Left    bx x 3-neg  . COLON SURGERY     sigmoid resection  . COLONOSCOPY     2007, 2012  . COLONOSCOPY WITH PROPOFOL N/A 09/17/2015   Procedure: COLONOSCOPY WITH PROPOFOL;  Surgeon: Robert Bellow, MD;  [facility] ENDOSCOPY;  Service: Endoscopy;  Laterality: N/A;  . SPINE SURGERY     L4-5   Family History  Problem Relation Age of Onset  . Osteoporosis Mother   . Hypertension Father   . Heart attack Father   . Stroke Maternal Grandfather   . Breast cancer Sister 72   Social History   Socioeconomic History  . Marital status: Married    Spouse name: Not on file  . Number of children: Not on file  . Years of education: 52  . Highest education level: 12th grade  Occupational History  . Not on file  Social Needs  . Financial resource strain: Not hard at all  . Food insecurity:    Worry: Never true  Inability: Never true  . Transportation needs:    Medical: No    Non-medical: No  Tobacco Use  . Smoking status: Current Every Day Smoker    Packs/day: 1.00    Years: 35.00    Pack years: 35.00    Types: Cigarettes  . Smokeless tobacco: Never Used  Substance and Sexual Activity  . Alcohol use: No    Alcohol/week: 0.0 standard drinks  . Drug use: No  . Sexual activity: Not on file  Lifestyle  . Physical activity:    Days per week: 0 days    Minutes per session: 0 min  . Stress: To some extent  Relationships  . Social connections:    Talks on phone: More than three times a week    Gets together: Once a week    Attends religious service: More than 4 times per year    Active member of club or organization: No    Attends meetings of clubs or  organizations: Never    Relationship status: Married  Other Topics Concern  . Not on file  Social History Narrative  . Not on file    Outpatient Encounter Medications as of 02/16/2018  Medication Sig  . aspirin EC 81 MG tablet Take 81 mg by mouth every other day.   Marland Kitchen atorvastatin (LIPITOR) 10 MG tablet Take 1 tablet (10 mg total) by mouth daily.  . benazepril (LOTENSIN) 40 MG tablet Take 1 tablet (40 mg total) by mouth daily.  . diphenhydrAMINE (BENADRYL) 25 MG tablet Take 25 mg by mouth at bedtime as needed.  . fexofenadine (ALLEGRA) 180 MG tablet Take 180 mg by mouth daily. As needed  . LORazepam (ATIVAN) 1 MG tablet Take 0.5 tablets (0.5 mg total) by mouth 2 (two) times daily as needed for anxiety.  . Multiple Vitamins-Minerals (MULTIVITAMIN WITH MINERALS) tablet Take 1 tablet by mouth daily.  Marland Kitchen omeprazole (PRILOSEC) 20 MG capsule Take 1 capsule (20 mg total) by mouth daily as needed.  Marland Kitchen PARoxetine (PAXIL) 30 MG tablet Take 1 tablet (30 mg total) by mouth daily.  . Triamcinolone Acetonide (NASACORT AQ NA) Place 2 Doses into the nose daily as needed.   . [DISCONTINUED] augmented betamethasone dipropionate (DIPROLENE AF) 0.05 % cream Apply topically 2 (two) times daily.  . [DISCONTINUED] simethicone (MYLICON) 80 MG chewable tablet Chew 1 tablet (80 mg total) by mouth every 6 (six) hours as needed for flatulence.   No facility-administered encounter medications on file as of 02/16/2018.     Activities of Daily Living In your present state of health, do you have any difficulty performing the following activities: 02/16/2018 01/09/2018  Hearing? N N  Vision? N N  Difficulty concentrating or making decisions? N N  Walking or climbing stairs? N N  Dressing or bathing? N N  Doing errands, shopping? N N  Preparing Food and eating ? N -  Using the Toilet? N -  In the past six months, have you accidently leaked urine? Y -  Comment occasionally -  Do you have problems with loss of bowel  control? N -  Managing your Medications? N -  Managing your Finances? N -  Housekeeping or managing your Housekeeping? N -  Some recent data might be hidden    Patient Care Team: [name], MD as PCP - General (Family Medicine) [name], MD as PCP - Family Medicine (Family Medicine) Forest Gleason, MD (Inactive) (Oncology) Oneta Rack, MD (Dermatology) Manya Silvas, MD (Gastroenterology)  Robert Bellow, MD (General Surgery) Lloyd Huger, MD as Medical Oncologist (Hematology and Oncology)    Assessment:   This is a routine wellness examination for Marcelia.  Exercise Activities and Dietary recommendations Current Exercise Habits: The patient does not participate in regular exercise at present, Exercise limited by: None identified  Goals    . DIET - INCREASE WATER INTAKE     Recommend drinking at least 6-8 glasses of water a day     . Quit Smoking     Smoking cessation discussed       Fall Risk Fall Risk  02/16/2018 01/09/2018 08/09/2017 01/24/2017 07/13/2016  Falls in the past year? 0 0 No No No  Follow up - Falls evaluation completed - - -   Is the patient's home free of loose throw rugs in walkways, pet beds, electrical cords, etc?   yes      Grab bars in the bathroom? no      Handrails on the stairs?   yes      Adequate lighting?   yes  Depression Screen PHQ 2/9 Scores 02/16/2018 01/09/2018 10/07/2017 01/24/2017  PHQ - 2 Score 1 0 0 0  PHQ- 9 Score - 1 0 -     Cognitive Function     6CIT Screen 02/16/2018 01/24/2017  What Year? 0 points 0 points  What month? 0 points 0 points  What time? 0 points 0 points  Count back from 20 0 points 0 points  Months in reverse 0 points 0 points  Repeat phrase 2 points 4 points  Total Score 2 4    Immunization History  Administered Date(s) Administered  . Influenza, High Dose Seasonal PF 01/04/2017  . Influenza,inj,Quad PF,6+ Mos 01/06/2015, 11/21/2015, 10/07/2017  . Pneumococcal  Conjugate-13 03/06/2014  . Pneumococcal-Unspecified 02/22/1993, 02/23/2003  . Td 07/24/2003  . Tdap 11/18/2010  . Zoster 11/03/2005  . Zoster Recombinat (Shingrix) 02/04/2018    Qualifies for Shingles Vaccine? Up to date, needs second shot in 2-6 months  Screening Tests Health Maintenance  Topic Date Due  . COLONOSCOPY  09/16/2020  . TETANUS/TDAP  11/17/2020  . INFLUENZA VACCINE  Completed  . DEXA SCAN  Completed    Cancer Screenings: Lung: Low Dose CT Chest recommended if Age 67-80 years, 30 pack-year currently smoking OR have quit w/in 15years. Patient does not qualify. Breast:  Up to date on Mammogram? Yes   Up to date of Bone Density/Dexa? Yes Colorectal: up to date  Additional Screenings:  Hepatitis C Screening: declined     Plan:    I have personally reviewed and addressed the Medicare Annual Wellness questionnaire and have noted the following in the patient's chart:  A. Medical and social history B. Use of alcohol, tobacco or illicit drugs  C. Current medications and supplements D. Functional ability and status E.  Nutritional status F.  Physical activity G. Advance directives H. List of other physicians I.  Hospitalizations, surgeries, and ER visits in previous 12 months J.  [place] to include hearing, vision, cognitive, depression L. Referrals and appointments - none  In addition, I have reviewed and discussed with patient certain preventive protocols, quality metrics, and best practice recommendations. A written personalized care plan for preventive services as well as general preventive health recommendations were provided to patient.  See attached scanned questionnaire for additional information.   Signed,   Tyson Dense, RN Nurse Health Advisor  Patient Concerns: None

## 2018-03-24 NOTE — Progress Notes (Signed)
[place]  [phone] Fax:(336) [phone]  ID: Madoline Bhatt OB: 1940/03/23  MR#: [id number]  TDD#:[id number]  Patient Care Team: [name], MD as PCP - General (Family Medicine) [name], MD as PCP - Family Medicine (Family Medicine) Forest Gleason, MD (Inactive) (Oncology) Oneta Rack, MD (Dermatology) Manya Silvas, MD (Gastroenterology) Bary Castilla Forest Gleason, MD (General Surgery) Lloyd Huger, MD as Medical Oncologist (Hematology and Oncology)  CHIEF COMPLAINT: CLL  INTERVAL HISTORY: Patient returns to clinic today for repeat laboratory work and further evaluation.  She currently feels well and is asymptomatic. She does not complain of weakness or fatigue today.  She denies any fevers, weight loss, or night sweats. She has no neurologic complaints. She has no chest pain or shortness of breath. She denies any nausea, vomiting, diarrhea, or constipation.  She denies any further abdominal pain.  She has no urinary complaints.  Patient feels at her baseline offers no specific complaints today.  REVIEW OF SYSTEMS:   Review of Systems  Constitutional: Negative.  Negative for fever, malaise/fatigue and weight loss.  HENT: Negative.   Respiratory: Negative.  Negative for cough and shortness of breath.   Cardiovascular: Negative.  Negative for chest pain and leg swelling.  Gastrointestinal: Negative.  Negative for blood in stool, constipation, diarrhea, nausea and vomiting.  Genitourinary: Negative.  Negative for frequency, hematuria and urgency.  Musculoskeletal: Negative.   Skin: Negative.  Negative for rash.  Neurological: Negative.  Negative for tingling, sensory change, focal weakness, weakness and headaches.  Psychiatric/Behavioral: Negative.  The patient is not nervous/anxious and does not have insomnia.     As per HPI. Otherwise, a complete review of systems is negative.  PAST MEDICAL HISTORY: Past Medical  History:  Diagnosis Date  . Allergy   . Anxiety   . Depression   . GERD (gastroesophageal reflux disease)   . Hemorrhoids   . Leukemia, lymphoid ([place])    CLL  . Lobar pneumonia ([place])   . Osteopenia   . Personal history of chemotherapy     PAST SURGICAL HISTORY: Past Surgical History:  Procedure Laterality Date  . ABDOMINAL HYSTERECTOMY    . APPENDECTOMY    . BREAST BIOPSY Left    bx x 3-neg  . COLON SURGERY     sigmoid resection  . COLONOSCOPY     2007, 2012  . COLONOSCOPY WITH PROPOFOL N/A 09/17/2015   Procedure: COLONOSCOPY WITH PROPOFOL;  Surgeon: Robert Bellow, MD;  Location: [facility] ENDOSCOPY;  Service: Endoscopy;  Laterality: N/A;  . SPINE SURGERY     L4-5    FAMILY HISTORY Family History  Problem Relation Age of Onset  . Osteoporosis Mother   . Hypertension Father   . Heart attack Father   . Stroke Maternal Grandfather   . Breast cancer Sister 3       ADVANCED DIRECTIVES:    HEALTH MAINTENANCE: Social History   Tobacco Use  . Smoking status: Current Every Day Smoker    Packs/day: 1.00    Years: 35.00    Pack years: 35.00    Types: Cigarettes  . Smokeless tobacco: Never Used  Substance Use Topics  . Alcohol use: No    Alcohol/week: 0.0 standard drinks  . Drug use: No     Allergies  Allergen Reactions  . Augmentin [Amoxicillin-Pot Clavulanate] Swelling    tongue  . Biaxin [Clarithromycin] Swelling and Rash    tongue    Current Outpatient Medications  Medication Sig  Dispense Refill  . aspirin EC 81 MG tablet Take 81 mg by mouth every other day.     Marland Kitchen atorvastatin (LIPITOR) 10 MG tablet Take 1 tablet (10 mg total) by mouth daily. 90 tablet 4  . benazepril (LOTENSIN) 40 MG tablet Take 1 tablet (40 mg total) by mouth daily. 90 tablet 4  . diphenhydrAMINE (BENADRYL) 25 MG tablet Take 25 mg by mouth at bedtime as needed.    . fexofenadine (ALLEGRA) 180 MG tablet Take 180 mg by mouth daily. As needed    . LORazepam (ATIVAN) 1 MG tablet Take  0.5 tablets (0.5 mg total) by mouth 2 (two) times daily as needed for anxiety. 30 tablet 5  . Multiple Vitamins-Minerals (MULTIVITAMIN WITH MINERALS) tablet Take 1 tablet by mouth daily.    Marland Kitchen omeprazole (PRILOSEC) 20 MG capsule Take 1 capsule (20 mg total) by mouth daily as needed. 90 capsule 4  . PARoxetine (PAXIL) 30 MG tablet Take 1 tablet (30 mg total) by mouth daily. 90 tablet 4  . Triamcinolone Acetonide (NASACORT AQ NA) Place 2 Doses into the nose daily as needed.      No current facility-administered medications for this visit.     OBJECTIVE: Vitals:   03/31/18 1106  BP: (!) 150/73  Pulse: 76  Resp: 20  Temp: (!) 97 F (36.1 C)     Body mass index is 23.79 kg/m.    ECOG FS:0 - Asymptomatic  General: Well-developed, well-nourished, no acute distress. Eyes: Pink conjunctiva, anicteric sclera. HEENT: Normocephalic, moist mucous membranes, clear oropharnyx. Lungs: Clear to auscultation bilaterally. Heart: Regular rate and rhythm. No rubs, murmurs, or gallops. Abdomen: Soft, nontender, nondistended. No organomegaly noted, normoactive bowel sounds. Musculoskeletal: No edema, cyanosis, or clubbing. Neuro: Alert, answering all questions appropriately. Cranial nerves grossly intact. Skin: No rashes or petechiae noted. Psych: Normal affect. Lymphatics: No cervical, calvicular, axillary or inguinal LAD.  LAB RESULTS:  Lab Results  Component Value Date   NA 142 12/03/2017   K 3.9 12/03/2017   CL 106 12/03/2017   CO2 27 12/03/2017   GLUCOSE 118 (H) 12/03/2017   BUN 7 (L) 12/03/2017   CREATININE 0.67 12/03/2017   CALCIUM 9.4 12/03/2017   PROT 7.0 12/03/2017   ALBUMIN 4.4 12/03/2017   AST 18 12/03/2017   ALT 12 12/03/2017   ALKPHOS 66 12/03/2017   BILITOT 1.0 12/03/2017   GFRNONAA >60 12/03/2017   GFRAA >60 12/03/2017    Lab Results  Component Value Date   WBC 25.4 (H) 03/31/2018   NEUTROABS 3.3 03/31/2018   HGB 15.3 (H) 03/31/2018   HCT 45.2 03/31/2018   MCV 99.1  03/31/2018   PLT 144 (L) 03/31/2018     STUDIES: No results found.  ASSESSMENT: CLL, Rai stage 0  PLAN:    1. CLL: Patient initially completed 4 cycles of weekly Rituxan on Jul 18, 2015.  She was recently retreated and completed 4 additional weekly treatments on Jul 01, 2017.  Her white blood cell count is slowly trending up and is now 25.4.  Patient remains asymptomatic.  No intervention is needed at this time.  Return to clinic in 3 months with repeat laboratory work and further evaluation. 2.  Thrombocytopenia: Platelets are mildly decreased at 144, but stable. 3.  Diverticulitis: Resolved. 4.  Tobacco abuse: CT lung cancer screening on February 02, 2018 reported a Lungs-RADS 4AS.  Recommendation was to repeat CT scan in 3 months.  Note was sent to nurse navigator to ensure imaging has  been scheduled.  Patient expressed understanding and was in agreement with this plan. She also understands that She can call clinic at any time with any questions, concerns, or complaints.    Lloyd Huger, MD 03/31/18 11:55 AM

## 2018-03-29 LAB — IGVH SOMATIC HYPERMUTATION

## 2018-03-31 ENCOUNTER — Other Ambulatory Visit: Payer: Self-pay

## 2018-03-31 ENCOUNTER — Inpatient Hospital Stay (HOSPITAL_BASED_OUTPATIENT_CLINIC_OR_DEPARTMENT_OTHER): Payer: Medicare HMO | Admitting: Oncology

## 2018-03-31 ENCOUNTER — Encounter: Payer: Self-pay | Admitting: Oncology

## 2018-03-31 ENCOUNTER — Inpatient Hospital Stay: Payer: Medicare HMO | Attending: Oncology

## 2018-03-31 VITALS — BP 150/73 | HR 76 | Temp 97.0°F | Resp 20 | Wt 151.9 lb

## 2018-03-31 DIAGNOSIS — D696 Thrombocytopenia, unspecified: Secondary | ICD-10-CM | POA: Insufficient documentation

## 2018-03-31 DIAGNOSIS — F17210 Nicotine dependence, cigarettes, uncomplicated: Secondary | ICD-10-CM | POA: Diagnosis not present

## 2018-03-31 DIAGNOSIS — C9110 Chronic lymphocytic leukemia of B-cell type not having achieved remission: Secondary | ICD-10-CM | POA: Diagnosis not present

## 2018-03-31 DIAGNOSIS — Z79899 Other long term (current) drug therapy: Secondary | ICD-10-CM | POA: Diagnosis not present

## 2018-03-31 DIAGNOSIS — Z7982 Long term (current) use of aspirin: Secondary | ICD-10-CM | POA: Diagnosis not present

## 2018-03-31 DIAGNOSIS — R69 Illness, unspecified: Secondary | ICD-10-CM | POA: Diagnosis not present

## 2018-03-31 LAB — CBC WITH DIFFERENTIAL/PLATELET
Abs Immature Granulocytes: 0.03 10*3/uL (ref 0.00–0.07)
Basophils Absolute: 0.1 10*3/uL (ref 0.0–0.1)
Basophils Relative: 0 %
Eosinophils Absolute: 0.2 10*3/uL (ref 0.0–0.5)
Eosinophils Relative: 1 %
HCT: 45.2 % (ref 36.0–46.0)
Hemoglobin: 15.3 g/dL — ABNORMAL HIGH (ref 12.0–15.0)
Immature Granulocytes: 0 %
Lymphocytes Relative: 77 %
Lymphs Abs: 19.6 10*3/uL — ABNORMAL HIGH (ref 0.7–4.0)
MCH: 33.6 pg (ref 26.0–34.0)
MCHC: 33.8 g/dL (ref 30.0–36.0)
MCV: 99.1 fL (ref 80.0–100.0)
Monocytes Absolute: 2.2 10*3/uL — ABNORMAL HIGH (ref 0.1–1.0)
Monocytes Relative: 9 %
Neutro Abs: 3.3 10*3/uL (ref 1.7–7.7)
Neutrophils Relative %: 13 %
Platelets: 144 10*3/uL — ABNORMAL LOW (ref 150–400)
RBC: 4.56 MIL/uL (ref 3.87–5.11)
RDW: 13.6 % (ref 11.5–15.5)
WBC: 25.4 10*3/uL — ABNORMAL HIGH (ref 4.0–10.5)
nRBC: 0 % (ref 0.0–0.2)

## 2018-03-31 NOTE — Progress Notes (Signed)
Patient denies any concerns today.  

## 2018-04-22 ENCOUNTER — Telehealth: Payer: Self-pay

## 2018-04-22 NOTE — Telephone Encounter (Signed)
Call pt regarding lung screening. Left message to return call. 

## 2018-04-26 ENCOUNTER — Other Ambulatory Visit: Payer: Self-pay | Admitting: *Deleted

## 2018-04-26 DIAGNOSIS — Z87891 Personal history of nicotine dependence: Secondary | ICD-10-CM

## 2018-04-26 DIAGNOSIS — Z122 Encounter for screening for malignant neoplasm of respiratory organs: Secondary | ICD-10-CM

## 2018-04-27 ENCOUNTER — Encounter: Payer: Self-pay | Admitting: *Deleted

## 2018-05-01 ENCOUNTER — Other Ambulatory Visit: Payer: Self-pay

## 2018-05-01 ENCOUNTER — Encounter: Payer: Self-pay | Admitting: Nurse Practitioner

## 2018-05-01 ENCOUNTER — Ambulatory Visit (INDEPENDENT_AMBULATORY_CARE_PROVIDER_SITE_OTHER): Payer: Medicare HMO | Admitting: Nurse Practitioner

## 2018-05-01 VITALS — BP 121/75 | HR 66 | Temp 98.1°F | Ht 66.4 in | Wt 153.0 lb

## 2018-05-01 DIAGNOSIS — Z87891 Personal history of nicotine dependence: Secondary | ICD-10-CM | POA: Insufficient documentation

## 2018-05-01 DIAGNOSIS — R69 Illness, unspecified: Secondary | ICD-10-CM | POA: Diagnosis not present

## 2018-05-01 DIAGNOSIS — J0100 Acute maxillary sinusitis, unspecified: Secondary | ICD-10-CM

## 2018-05-01 DIAGNOSIS — J029 Acute pharyngitis, unspecified: Secondary | ICD-10-CM | POA: Diagnosis not present

## 2018-05-01 DIAGNOSIS — F17210 Nicotine dependence, cigarettes, uncomplicated: Secondary | ICD-10-CM | POA: Diagnosis not present

## 2018-05-01 LAB — VERITOR FLU A/B WAIVED
Influenza A: NEGATIVE
Influenza B: NEGATIVE

## 2018-05-01 MED ORDER — PREDNISONE 10 MG PO TABS: 30.0000 mg | ORAL_TABLET | Freq: Every day | ORAL | 0 refills | Status: AC

## 2018-05-01 MED ORDER — DOXYCYCLINE HYCLATE 100 MG PO TABS: 100.0000 mg | ORAL_TABLET | Freq: Two times a day (BID) | ORAL | 0 refills | Status: DC

## 2018-05-01 NOTE — Progress Notes (Addendum)
BP 121/75   Pulse 66   Temp 98.1 F (36.7 C) (Oral)   Ht 5' 6.4" (1.687 m)   Wt 153 lb (69.4 kg)   SpO2 95%   BMI 24.40 kg/m    Subjective:    Patient ID: Katherine [name], female    DOB: 04-10-1940, 78 y.o.   MRN: [id number]  HPI: Katherine [name] is a 78 y.o. female  Chief Complaint  Patient presents with  . Sore Throat    x 2 weeks. pt tried OTC benadryl, cough syrup and nasacort. not helping munch  . Nasal Congestion  . Fatigue  . Headache    on and off   UPPER RESPIRATORY TRACT INFECTION Pt reports symptoms started 2 weeks ago.  Endorses smoking 1/2 PPD at this time. Worst symptom: fatigue Fever: no Cough: yes, productive, cloudy Shortness of breath: no Wheezing: no Chest pain: no Chest tightness: no Chest congestion: no Nasal congestion: yes Runny nose: yes Post nasal drip: yes Sneezing: yes Sore throat: yes Swollen glands: no Sinus pressure: yes Headache: yes Face pain: yes Toothache: yes Ear pain: none Ear pressure: none Eyes red/itching:no Eye drainage/crusting: no  Vomiting: no Rash: no Fatigue: yes Sick contacts: no Strep contacts: no  Context: worse Recurrent sinusitis: no Relief with OTC cold/cough medications: no  Treatments attempted: anti-histamine and cough syrup, nasal spray, Neti Pot   Relevant past medical, surgical, family and social history reviewed and updated as indicated. Interim medical history since our last visit reviewed. Allergies and medications reviewed and updated.  Review of Systems  Constitutional: Positive for fatigue. Negative for activity change, appetite change, diaphoresis and fever.  HENT: Positive for congestion, postnasal drip, rhinorrhea, sinus pressure, sinus pain, sneezing, sore throat and voice change. Negative for ear discharge, ear pain and nosebleeds.   Eyes: Negative for pain and visual disturbance.  Respiratory: Positive for cough. Negative for chest tightness and shortness of breath.     Cardiovascular: Negative for chest pain, palpitations and leg swelling.  Gastrointestinal: Negative for abdominal distention, abdominal pain, constipation, diarrhea, nausea and vomiting.  Endocrine: Negative for cold intolerance, heat intolerance, polydipsia, polyphagia and polyuria.  Neurological: Negative for dizziness, syncope, weakness, light-headedness, numbness and headaches.  Psychiatric/Behavioral: Negative.     Per HPI unless specifically indicated above     Objective:    BP 121/75   Pulse 66   Temp 98.1 F (36.7 C) (Oral)   Ht 5' 6.4" (1.687 m)   Wt 153 lb (69.4 kg)   SpO2 95%   BMI 24.40 kg/m   Wt Readings from Last 3 Encounters:  05/01/18 153 lb (69.4 kg)  03/31/18 151 lb 14.4 oz (68.9 kg)  02/16/18 153 lb (69.4 kg)    Physical Exam Vitals signs and nursing note reviewed.  Constitutional:      General: She is awake.     Appearance: She is well-developed. She is ill-appearing.  HENT:     Head: Normocephalic.     Right Ear: Hearing, ear canal and external ear normal. No drainage. A middle ear effusion is present.     Left Ear: Hearing, ear canal and external ear normal. No drainage. A middle ear effusion is present.     Nose: Mucosal edema and rhinorrhea present. Rhinorrhea is purulent.     Right Sinus: Maxillary sinus tenderness present. No frontal sinus tenderness.     Left Sinus: Maxillary sinus tenderness present. No frontal sinus tenderness.     Mouth/Throat:     Mouth:  Mucous membranes are moist.     Pharynx: Posterior oropharyngeal erythema (mild with cobblestoning) present. No pharyngeal swelling or oropharyngeal exudate.     Tonsils: Swelling: 0 on the right. 0 on the left.  Eyes:     General:        Right eye: No discharge.        Left eye: No discharge.     Conjunctiva/sclera: Conjunctivae normal.     Pupils: Pupils are equal, round, and reactive to light.  Neck:     Musculoskeletal: Normal range of motion and neck supple.     Thyroid: No  thyromegaly.     Vascular: No carotid bruit or JVD.  Cardiovascular:     Rate and Rhythm: Normal rate and regular rhythm.     Heart sounds: Normal heart sounds.  Pulmonary:     Effort: Pulmonary effort is normal.     Breath sounds: Examination of the right-upper field reveals wheezing. Examination of the left-upper field reveals wheezing. Wheezing present.     Comments: Intermittent expiratory wheezes upper and clear throughout remainder with no use of accessory muscles.  Hoarseness noted. Abdominal:     General: Bowel sounds are normal.     Palpations: Abdomen is soft.  Lymphadenopathy:     Head:     Right side of head: Submandibular adenopathy present. No submental, tonsillar, preauricular or posterior auricular adenopathy.     Left side of head: Submandibular adenopathy present. No submental, tonsillar, preauricular or posterior auricular adenopathy.     Cervical: No cervical adenopathy.  Skin:    General: Skin is warm and dry.  Neurological:     Mental Status: She is alert and oriented to person, place, and time.  Psychiatric:        Attention and Perception: Attention normal.        Mood and Affect: Mood normal.        Speech: Speech normal.        Behavior: Behavior normal. Behavior is cooperative.        Thought Content: Thought content normal.        Judgment: Judgment normal.     Results for orders placed or performed in visit on 03/31/18  CBC with Differential/Platelet  Result Value Ref Range   WBC 25.4 (H) 4.0 - 10.5 K/uL   RBC 4.56 3.87 - 5.11 MIL/uL   Hemoglobin 15.3 (H) 12.0 - 15.0 g/dL   HCT 45.2 36.0 - 46.0 %   MCV 99.1 80.0 - 100.0 fL   MCH 33.6 26.0 - 34.0 pg   MCHC 33.8 30.0 - 36.0 g/dL   RDW 13.6 11.5 - 15.5 %   Platelets 144 (L) 150 - 400 K/uL   nRBC 0.0 0.0 - 0.2 %   Neutrophils Relative % 13 %   Neutro Abs 3.3 1.7 - 7.7 K/uL   Lymphocytes Relative 77 %   Lymphs Abs 19.6 (H) 0.7 - 4.0 K/uL   Monocytes Relative 9 %   Monocytes Absolute 2.2 (H) 0.1  - 1.0 K/uL   Eosinophils Relative 1 %   Eosinophils Absolute 0.2 0.0 - 0.5 K/uL   Basophils Relative 0 %   Basophils Absolute 0.1 0.0 - 0.1 K/uL   Immature Granulocytes 0 %   Abs Immature Granulocytes 0.03 0.00 - 0.07 K/uL      Assessment & Plan:   Problem List Items Addressed This Visit      Respiratory   Acute non-recurrent maxillary sinusitis - Primary  Ongoing 2 weeks with no improvement and occasionally intermittent wheezes (smoker).  Negative flu and strep testing.  Script for Doxycycline and Prednisone sent.  Recommend avoidance of Benadryl at home (BEERS criteria).  Recommend use of Claritin or Allegra as needed and Flonase.  Sinus rinses and humidifier at home.  Return for worsening or continued symptoms.      Relevant Medications   doxycycline (VIBRA-TABS) 100 MG tablet   predniSONE (DELTASONE) 10 MG tablet   Other Relevant Orders   Rapid Strep Screen ([facility] ONLY)   Veritor Flu A/B Waived     Other   Nicotine dependence, cigarettes, uncomplicated    I have recommended complete cessation of tobacco use. I have discussed various options available for assistance with tobacco cessation including over the counter methods (Nicotine gum, patch and lozenges). We also discussed prescription options (Chantix, Nicotine Inhaler / Nasal Spray). The patient is not interested in pursuing any prescription tobacco cessation options at this time.          Follow up plan: Return if symptoms worsen or fail to improve.

## 2018-05-01 NOTE — Assessment & Plan Note (Addendum)
Ongoing 2 weeks with no improvement and occasionally intermittent wheezes (smoker).  Negative flu and strep testing.  Script for Doxycycline and Prednisone sent.  Recommend avoidance of Benadryl at home (BEERS criteria).  Recommend use of Claritin or Allegra as needed and Flonase.  Sinus rinses and humidifier at home.  Return for worsening or continued symptoms.

## 2018-05-01 NOTE — Assessment & Plan Note (Signed)
I have recommended complete cessation of tobacco use. I have discussed various options available for assistance with tobacco cessation including over the counter methods (Nicotine gum, patch and lozenges). We also discussed prescription options (Chantix, Nicotine Inhaler / Nasal Spray). The patient is not interested in pursuing any prescription tobacco cessation options at this time.  

## 2018-05-01 NOTE — Patient Instructions (Signed)
Acute Bronchitis, Adult Acute bronchitis is when air tubes (bronchi) in the lungs suddenly get swollen. The condition can make it hard to breathe. It can also cause these symptoms:  A cough.  Coughing up clear, yellow, or green mucus.  Wheezing.  Chest congestion.  Shortness of breath.  A fever.  Body aches.  Chills.  A sore throat. Follow these instructions at home:  Medicines  Take over-the-counter and prescription medicines only as told by your doctor.  If you were prescribed an antibiotic medicine, take it as told by your doctor. Do not stop taking the antibiotic even if you start to feel better. General instructions  Rest.  Drink enough fluids to keep your pee (urine) pale yellow.  Avoid smoking and secondhand smoke. If you smoke and you need help quitting, ask your doctor. Quitting will help your lungs heal faster.  Use an inhaler, cool mist vaporizer, or humidifier as told by your doctor.  Keep all follow-up visits as told by your doctor. This is important. How is this prevented? To lower your risk of getting this condition again:  Wash your hands often with soap and water. If you cannot use soap and water, use hand sanitizer.  Avoid contact with people who have cold symptoms.  Try not to touch your hands to your mouth, nose, or eyes.  Make sure to get the flu shot every year. Contact a doctor if:  Your symptoms do not get better in 2 weeks. Get help right away if:  You cough up blood.  You have chest pain.  You have very bad shortness of breath.  You become dehydrated.  You faint (pass out) or keep feeling like you are going to pass out.  You keep throwing up (vomiting).  You have a very bad headache.  Your fever or chills gets worse. This information is not intended to replace advice given to you by your health care provider. Make sure you discuss any questions you have with your health care provider. Document Released: 07/28/2007 Document  Revised: 09/22/2016 Document Reviewed: 07/30/2015 Elsevier Interactive Patient Education  2019 [employer]  

## 2018-05-04 LAB — RAPID STREP SCREEN (MED CTR MEBANE ONLY): Strep Gp A Ag, IA W/Reflex: NEGATIVE

## 2018-05-04 LAB — CULTURE, GROUP A STREP: Strep A Culture: NEGATIVE

## 2018-05-09 ENCOUNTER — Ambulatory Visit: Admission: RE | Admit: 2018-05-09 | Payer: Medicare HMO | Source: Ambulatory Visit

## 2018-05-16 ENCOUNTER — Ambulatory Visit: Admission: RE | Admit: 2018-05-16 | Payer: Medicare HMO | Source: Ambulatory Visit

## 2018-05-17 ENCOUNTER — Telehealth: Payer: Self-pay | Admitting: *Deleted

## 2018-05-17 ENCOUNTER — Encounter: Payer: Self-pay | Admitting: *Deleted

## 2018-05-17 NOTE — Telephone Encounter (Signed)
Contacted patient regarding rescheduled ling screening scan. Patient verbalizes concerns about coming in due to current restrictions. She would like to be contacted after restrictions are lifted.

## 2018-05-18 ENCOUNTER — Ambulatory Visit: Admission: RE | Admit: 2018-05-18 | Payer: Medicare HMO | Source: Ambulatory Visit

## 2018-05-30 ENCOUNTER — Inpatient Hospital Stay: Admission: RE | Admit: 2018-05-30 | Payer: Medicare HMO | Source: Ambulatory Visit

## 2018-06-19 ENCOUNTER — Telehealth: Payer: Self-pay | Admitting: *Deleted

## 2018-06-19 NOTE — Telephone Encounter (Signed)
   Left message for patient to notify them that it is time to schedule lung cancer screening CT scan follow up imaging. Instructed patient to call back to verify information prior to the scan being scheduled.

## 2018-06-22 ENCOUNTER — Telehealth: Payer: Self-pay | Admitting: *Deleted

## 2018-06-22 NOTE — Telephone Encounter (Signed)
Voicemail left in attempt to schedule LCS nodule follow up scan that is due.

## 2018-06-27 ENCOUNTER — Ambulatory Visit: Payer: Medicare HMO

## 2018-07-04 NOTE — Progress Notes (Signed)
[facility]  [address], [address] [place], [place] [address] Phone: [phone]  Fax: [phone]   Clinic Day:  07/05/2018  Referring physician: [name], MD  Chief Complaint: Katherine [name] is a 78 y.o. female with chronic lymphocytic leukemia (CLL) who is seen for new [name] assessment.  HPI: Katherine [name] was diagnosed with CLL on 12/11/1993 after routine blood work.  Katherine [name] was initially under Katherine care of Dr. Paulino Door.  Katherine [name] was asymptomatic for several years.  Her earliest available WBC was 56,500 on 06/18/2004.  Katherine [name] first became symptomatic, with fatigue, in 01/2015.  WBC was 66,000. Katherine [name] was closely followed for Katherine next 4 months; Katherine [name] was opposed to starting treatment.   Treatment was initiated on 06/27/2015 with 4 cycles of weekly Rituxan.   Katherine [name] developed worsening fatigue and weakness. WBC was 93,400.   Katherine [name] received weekly Rituxan x 4 beginning 05/27/2017.   WBC has been followed: 30,100 on 06/16/2011, 22,800 on 07/14/2012, 22,300 in 08/10/2013, 40,100 on 08/09/2014, 62,900 on 01/06/2015, 66,800 on 02/21/2015, 88,500 on 04/04/2015, 99,400 on 05/23/2015, 103,700 on 06/27/2015, 114,600 on 07/04/2015, 79,900 on 07/11/2015, 46,200 on 07/18/2015, 22,200 on 08/15/15, 17,200 on 09/12/2015, 13,900 on 11/21/2015, 10,000 on 12/08/2015, 19,000 on 03/05/2016, 31,300 on 06/04/2016, 36,200 on 09/03/2016, 43,600 on 12/03/2016, 52,000 on 01/04/2017, 65,800 on 03/04/2017, 78,500 on 04/15/2017, 93,400 on 05/27/2017, 101,700 on 06/10/2017, 92,600 on 06/17/2017, 59,200 on 06/24/2017, 43,600 on 07/01/2017, 30,100 on 08/12/2017, 18,400 on 09/30/2017, 11,100 on 12/03/2017, 6,500 on 12/06/2017, 16,600 on 12/30/2017, 25,400 on 03/31/2018.   Katherine [name] enrolled in Katherine lung cancer low dose CT screening program in 2018.  Low dose chest CT on 02/02/2018 revealed a new endobronchial lesion in Katherine segmental bronchus to Katherine medial segment of Katherine right middle lobe.  This may simply reflect  an area of retained secretions, however, Katherine possibility of an endobronchial neoplasm should be considered. Lung-RADS 4AS, suspicious. Follow up low-dose chest CT without contrast in 3 months was recommended. Katherine [name] does not currently have a pulmonologist.   Katherine [name] was last seen in Katherine hematology clinic on 03/31/2018 by Dr. Grayland Ormond. At that time, Katherine [name] was doing well.  Katherine [name] was asymptomatic. WBC was 25,400. Platelets were 144,000.   During Katherine interim, Katherine [name] reports "Katherine [name] feel okay." Katherine [name] denies any fevers, sweats, or masses. Katherine [name] gets fatigued easily.   Katherine [name] reports occasional headaches, related to her seasonal allergies. Katherine [name] has an occasional cough.   Katherine [name] has arthritic joint pain. Katherine [name] bruises easily. Katherine [name] takes aspirin every other day.   Katherine [name] has a history of left abdominal pain in 11/2017.  Katherine [name] was seen in Katherine ER.  Abdomen and pelvic CT on 12/03/2017 revealed non complicated sigmoid diverticulitis, cholelithiasis, and mild degradation within Katherine lower chest and abdomen. Katherine [name] was put on antibiotics and Katherine pain resolved.   Katherine [name] reports a family history of breast cancer in her sister, MI causing death in her sister and dad, and dementia in her mother.    Past Medical History:  Diagnosis Date  . Allergy   . Anxiety   . Depression   . GERD (gastroesophageal reflux disease)   . Hemorrhoids   . Leukemia, lymphoid ([place])    CLL  . Lobar pneumonia ([place])   . Osteopenia   . Personal history of chemotherapy     Past Surgical History:  Procedure Laterality Date  . ABDOMINAL HYSTERECTOMY    . APPENDECTOMY    . BREAST BIOPSY Left    bx x 3-neg  .  COLON SURGERY     sigmoid resection  . COLONOSCOPY     2007, 2012  . COLONOSCOPY WITH PROPOFOL N/A 09/17/2015   Procedure: COLONOSCOPY WITH PROPOFOL;  Surgeon: Robert Bellow, MD;  Location: [facility];  Service: Endoscopy;  Laterality: N/A;  . SPINE SURGERY     L4-5    Family History  Problem Relation Age of Onset  . Osteoporosis Mother   .  Hypertension Father   . Heart attack Father   . Stroke Maternal Grandfather   . Breast cancer Sister 13    Social History:  reports that Katherine [name] has been smoking cigarettes. Katherine [name] has a 35.00 pack-year smoking history. Katherine [name] has never used smokeless tobacco. Katherine [name] reports that Katherine [name] does not drink alcohol or use drugs. Katherine [name] currently smokes 5 cigarettes daily, but is working toward quitting.  Katherine [name] previously smoked a pack a day for a "long time". Katherine [name] lives in [place], [place]. Katherine [name] is alone today.  Allergies:  Allergies  Allergen Reactions  . Augmentin [Amoxicillin-Pot Clavulanate] Swelling    tongue  . Biaxin [Clarithromycin] Swelling and Rash    tongue    Current Medications: Current Outpatient Medications  Medication Sig Dispense Refill  . aspirin EC 81 MG tablet Take 81 mg by mouth every other day.     Marland Kitchen atorvastatin (LIPITOR) 10 MG tablet Take 1 tablet (10 mg total) by mouth daily. 90 tablet 4  . benazepril (LOTENSIN) 40 MG tablet Take 1 tablet (40 mg total) by mouth daily. 90 tablet 4  . diphenhydrAMINE (BENADRYL) 25 MG tablet Take 25 mg by mouth at bedtime as needed.    . Multiple Vitamins-Minerals (MULTIVITAMIN WITH MINERALS) tablet Take 1 tablet by mouth daily.    Marland Kitchen omeprazole (PRILOSEC) 20 MG capsule Take 1 capsule (20 mg total) by mouth daily as needed. 90 capsule 4  . PARoxetine (PAXIL) 30 MG tablet Take 1 tablet (30 mg total) by mouth daily. 90 tablet 4  . doxycycline (VIBRA-TABS) 100 MG tablet Take 1 tablet (100 mg total) by mouth 2 (two) times daily. ([name] not taking: Reported on 07/05/2018) 20 tablet 0  . fexofenadine (ALLEGRA) 180 MG tablet Take 180 mg by mouth daily. As needed    . LORazepam (ATIVAN) 1 MG tablet Take 0.5 tablets (0.5 mg total) by mouth 2 (two) times daily as needed for anxiety. ([name] not taking: Reported on 07/05/2018) 30 tablet 5  . Triamcinolone Acetonide (NASACORT AQ NA) Place 2 Doses into Katherine nose daily as needed.      No current facility-administered  medications for this visit.     Review of Systems  Constitutional: Positive for malaise/fatigue. Negative for chills, diaphoresis, fever and weight loss (stable).       "Katherine [name] feel okay."   HENT: Negative.  Negative for congestion, hearing loss, nosebleeds, sinus pain and sore throat.   Eyes: Negative.  Negative for blurred vision, double vision, photophobia and pain.  Respiratory: Positive for cough. Negative for hemoptysis, sputum production, shortness of breath and wheezing.   Cardiovascular: Negative.  Negative for chest pain, palpitations, orthopnea, leg swelling and PND.  Gastrointestinal: Negative.  Negative for abdominal pain, blood in stool, constipation, diarrhea, heartburn, melena, nausea and vomiting.  Genitourinary: Negative.  Negative for dysuria, frequency, hematuria and urgency.  Musculoskeletal: Positive for joint pain (arthritis). Negative for back pain and myalgias.  Skin: Negative.  Negative for itching and rash.  Neurological: Positive for headaches (occasionally). Negative for dizziness, tingling, speech change, focal weakness and weakness.  Endo/Heme/Allergies: Positive for environmental allergies. Bruises/bleeds easily (bruise easily).  Psychiatric/Behavioral: Negative.  Negative for depression and memory loss. Katherine [name] is not nervous/anxious and does not have insomnia.   All other systems reviewed and are negative.  Performance status (ECOG): 1  Physical Exam  Constitutional: Katherine [name] is oriented to person, place, and time. Katherine [name] appears well-developed and well-nourished. No distress.  HENT:  Head: Normocephalic and atraumatic.  Mouth/Throat: Oropharynx is clear and moist. No oropharyngeal exudate.  Short gray hair. Wearing a mask.  Eyes: Pupils are equal, round, and reactive to light. Conjunctivae and EOM are normal. No scleral icterus.  Glasses.  Brown eyes.  Neck: Normal range of motion. Neck supple.  Cardiovascular: Normal rate, regular rhythm and normal heart  sounds. Exam reveals no gallop and no friction rub.  No murmur heard. Pulmonary/Chest: Effort normal. No respiratory distress. Katherine [name] has wheezes in Katherine right lower field.  Wet cough.  Abdominal: Soft. Bowel sounds are normal. Katherine [name] exhibits no distension and no mass. There is no abdominal tenderness. There is no rebound and no guarding.  Musculoskeletal: Normal range of motion.        General: Edema (bilateral ankles) present.  Lymphadenopathy:    Katherine [name] has no cervical adenopathy.    Katherine [name] has no axillary adenopathy.       Right: No supraclavicular adenopathy present.       Left: No supraclavicular adenopathy present.  Neurological: Katherine [name] is alert and oriented to person, place, and time.  Skin: Skin is warm and dry. No rash noted. Katherine [name] is not diaphoretic. No erythema. No pallor.  Psychiatric: Katherine [name] has a normal mood and affect. Her behavior is normal. Judgment and thought content normal.  Nursing note and vitals reviewed.   Appointment on 07/05/2018  Component Date Value Ref Range Status  . LDH 07/05/2018 136  98 - 192 U/L Final   Performed at [facility], [address] [place], [place] [address]  . Sodium 07/05/2018 141  135 - 145 mmol/L Final  . Potassium 07/05/2018 4.3  3.5 - 5.1 mmol/L Final  . Chloride 07/05/2018 103  98 - 111 mmol/L Final  . CO2 07/05/2018 29  22 - 32 mmol/L Final  . Glucose, Bld 07/05/2018 98  70 - 99 mg/dL Final  . BUN 07/05/2018 11  8 - 23 mg/dL Final  . Creatinine, Ser 07/05/2018 0.82  0.44 - 1.00 mg/dL Final  . Calcium 07/05/2018 9.6  8.9 - 10.3 mg/dL Final  . Total Protein 07/05/2018 6.6  6.5 - 8.1 g/dL Final  . Albumin 07/05/2018 4.7  3.5 - 5.0 g/dL Final  . AST 07/05/2018 20  15 - 41 U/L Final  . ALT 07/05/2018 15  0 - 44 U/L Final  . Alkaline Phosphatase 07/05/2018 67  38 - 126 U/L Final  . Total Bilirubin 07/05/2018 1.2  0.3 - 1.2 mg/dL Final  . GFR calc non Af Amer 07/05/2018 >60  >60 mL/min Final  . GFR calc Af Amer 07/05/2018 >60  >60 mL/min  Final  . Anion gap 07/05/2018 9  5 - 15 Final   Performed at [facility], [address] [place], [place] [address]  . WBC 07/05/2018 PENDING  4.0 - 10.5 K/uL Incomplete  . RBC 07/05/2018 4.46  3.87 - 5.11 MIL/uL Final  . Hemoglobin 07/05/2018 14.9  12.0 - 15.0 g/dL Final  . HCT 07/05/2018 44.3  36.0 - 46.0 % Final  . MCV 07/05/2018 99.3  80.0 - 100.0 fL Final  .  MCH 07/05/2018 33.4  26.0 - 34.0 pg Final  . MCHC 07/05/2018 33.6  30.0 - 36.0 g/dL Final  . RDW 07/05/2018 12.7  11.5 - 15.5 % Final  . Platelets 07/05/2018 141* 150 - 400 K/uL Final   Performed at [facility], [address], [place], [place] [address]  . nRBC 07/05/2018 PENDING  0.0 - 0.2 % Incomplete  . Neutrophils Relative % 07/05/2018 PENDING  % Incomplete  . Neutro Abs 07/05/2018 PENDING  1.7 - 7.7 K/uL Incomplete  . Band Neutrophils 07/05/2018 PENDING  % Incomplete  . Lymphocytes Relative 07/05/2018 PENDING  % Incomplete  . Lymphs Abs 07/05/2018 PENDING  0.7 - 4.0 K/uL Incomplete  . Monocytes Relative 07/05/2018 PENDING  % Incomplete  . Monocytes Absolute 07/05/2018 PENDING  0.1 - 1.0 K/uL Incomplete  . Eosinophils Relative 07/05/2018 PENDING  % Incomplete  . Eosinophils Absolute 07/05/2018 PENDING  0.0 - 0.5 K/uL Incomplete  . Basophils Relative 07/05/2018 PENDING  % Incomplete  . Basophils Absolute 07/05/2018 PENDING  0.0 - 0.1 K/uL Incomplete  . WBC Morphology 07/05/2018 PENDING   Incomplete  . RBC Morphology 07/05/2018 PENDING   Incomplete  . Smear Review 07/05/2018 PENDING   Incomplete  . Other 07/05/2018 PENDING  % Incomplete  . nRBC 07/05/2018 PENDING  0 /100 WBC Incomplete  . Metamyelocytes Relative 07/05/2018 PENDING  % Incomplete  . Myelocytes 07/05/2018 PENDING  % Incomplete  . Promyelocytes Relative 07/05/2018 PENDING  % Incomplete  . Blasts 07/05/2018 PENDING  % Incomplete    Assessment:  Jasey Cortez is a 77 y.o. female with chronic lymphocytic leukemia (CLL).  WBC  has ranged between 22,000 - 101,7000 since 05/2011.  Katherine [name] has received Rituxan x2 four week cycles (06/27/2015 and 05/27/2017).  Hepatitis B surface antigen and hepatitis B surface antibody were negative on 07/04/2015.  Katherine [name] has a 35 pack year smoking history.  Katherine [name] is in Katherine low dose chest CT program.  Low dose chest CT on 02/02/2018 revealed a new endobronchial lesion in Katherine segmental bronchus to Katherine medial segment of Katherine right middle lobe. This may simply reflect an area of retained secretions, however, Katherine possibility of an endobronchial neoplasm should be considered. Lung-RADS 4AS, suspicious.   Symptomatically, Katherine [name] feels "ok".  Katherine [name] denies any B symptoms.  Katherine [name] denies any early satiety.  Weight is stable.  Exam reveals no adenopathy or hepatosplenomegaly.  Katherine [name] has a right sided wheeze.  WBC is 35,300.  Platelet count is 141,000.  Plan: 1.   Labs today:  CBC with diff, CMP, LDH, hepatitis B core antibody total. 2.   Chronic lymphocytic leukemia (CLL)  Discuss entire medical history, diagnosis, staging, and management of CLL.  Katherine [name] has received Rituxan x 4 for leukocytosis x 2.  WBC is 35,300.  Review indications for treatment.  Discuss ongoing surveillance. 3.   Health maintenance  [name] in Katherine lung cancer screening program.  Katherine [name] is working on quitting smoking.  Low dose chest CT on 02/02/2018 revealed a new endobronchial lesion in Katherine segmental bronchus to Katherine medial segment of Katherine right middle lobe.     Etiology unclear.  Possible retained secretions or  endobronchial neoplasm.  [name] has a low dose chest CT today. 4.   RTC in 3 months for MD assessment and labs (CBC with diff).  Addendum:  Chest CT today revealed Lung-RADS 2, benign appearance or behavior.  Prior right middle lobe endobronchial lesion/mucous plugging was no longer visualized.  New mucous plugging in  Katherine right lower lobe was noted.  Continue annual screening with low-dose chest CT without contrast in 12 months.  Katherine [name]  discussed Katherine assessment and treatment plan with Katherine [name].  Katherine [name] was provided an opportunity to ask questions and all were answered.  Katherine [name] agreed with Katherine plan and demonstrated an understanding of Katherine instructions.  Katherine [name] was advised to call back if Katherine symptoms worsen or if Katherine condition fails to improve as anticipated.   Lequita Asal, MD, PhD    07/05/2018, 9:15 AM  Katherine [name], Molly Dorshimer, am acting as Education administrator for [employer]. Mike Gip, MD, PhD.  Katherine [name], Melissa C. Mike Gip, MD, have reviewed Katherine above documentation for accuracy and completeness, and Katherine [name] agree with Katherine above.

## 2018-07-05 ENCOUNTER — Encounter: Payer: Self-pay | Admitting: Hematology and Oncology

## 2018-07-05 ENCOUNTER — Inpatient Hospital Stay: Payer: Medicare HMO | Attending: Hematology and Oncology

## 2018-07-05 ENCOUNTER — Ambulatory Visit
Admission: RE | Admit: 2018-07-05 | Discharge: 2018-07-05 | Disposition: A | Discharge status: Home / Self Care | Payer: Medicare HMO | Source: Ambulatory Visit | Attending: Nurse Practitioner | Admitting: Nurse Practitioner

## 2018-07-05 ENCOUNTER — Other Ambulatory Visit: Payer: Self-pay | Admitting: Hematology and Oncology

## 2018-07-05 ENCOUNTER — Other Ambulatory Visit: Payer: Self-pay

## 2018-07-05 ENCOUNTER — Inpatient Hospital Stay: Payer: Medicare HMO | Admitting: Hematology and Oncology

## 2018-07-05 VITALS — BP 138/76 | HR 73 | Temp 97.6°F | Resp 18 | Ht 66.4 in | Wt 153.9 lb

## 2018-07-05 DIAGNOSIS — Z87891 Personal history of nicotine dependence: Secondary | ICD-10-CM

## 2018-07-05 DIAGNOSIS — Z803 Family history of malignant neoplasm of breast: Secondary | ICD-10-CM | POA: Diagnosis not present

## 2018-07-05 DIAGNOSIS — C9110 Chronic lymphocytic leukemia of B-cell type not having achieved remission: Secondary | ICD-10-CM

## 2018-07-05 DIAGNOSIS — F17210 Nicotine dependence, cigarettes, uncomplicated: Secondary | ICD-10-CM | POA: Diagnosis not present

## 2018-07-05 DIAGNOSIS — M2550 Pain in unspecified joint: Secondary | ICD-10-CM

## 2018-07-05 DIAGNOSIS — Z122 Encounter for screening for malignant neoplasm of respiratory organs: Secondary | ICD-10-CM

## 2018-07-05 DIAGNOSIS — Z9221 Personal history of antineoplastic chemotherapy: Secondary | ICD-10-CM | POA: Diagnosis not present

## 2018-07-05 DIAGNOSIS — Z7982 Long term (current) use of aspirin: Secondary | ICD-10-CM | POA: Diagnosis not present

## 2018-07-05 DIAGNOSIS — R918 Other nonspecific abnormal finding of lung field: Secondary | ICD-10-CM | POA: Insufficient documentation

## 2018-07-05 DIAGNOSIS — R69 Illness, unspecified: Secondary | ICD-10-CM | POA: Diagnosis not present

## 2018-07-05 DIAGNOSIS — Z7189 Other specified counseling: Secondary | ICD-10-CM

## 2018-07-05 LAB — CBC WITH DIFFERENTIAL/PLATELET
Abs Immature Granulocytes: 0.04 10*3/uL (ref 0.00–0.07)
Basophils Absolute: 0.1 10*3/uL (ref 0.0–0.1)
Basophils Relative: 0 %
Eosinophils Absolute: 0.3 10*3/uL (ref 0.0–0.5)
Eosinophils Relative: 1 %
HCT: 44.3 % (ref 36.0–46.0)
Hemoglobin: 14.9 g/dL (ref 12.0–15.0)
Immature Granulocytes: 0 %
Lymphocytes Relative: 84 %
Lymphs Abs: 29.6 10*3/uL — ABNORMAL HIGH (ref 0.7–4.0)
MCH: 33.4 pg (ref 26.0–34.0)
MCHC: 33.6 g/dL (ref 30.0–36.0)
MCV: 99.3 fL (ref 80.0–100.0)
Monocytes Absolute: 2.4 10*3/uL — ABNORMAL HIGH (ref 0.1–1.0)
Monocytes Relative: 7 %
Neutro Abs: 2.8 10*3/uL (ref 1.7–7.7)
Neutrophils Relative %: 8 %
Platelets: 141 10*3/uL — ABNORMAL LOW (ref 150–400)
RBC: 4.46 MIL/uL (ref 3.87–5.11)
RDW: 12.7 % (ref 11.5–15.5)
WBC: 35.3 10*3/uL — ABNORMAL HIGH (ref 4.0–10.5)
nRBC: 0 % (ref 0.0–0.2)

## 2018-07-05 LAB — COMPREHENSIVE METABOLIC PANEL
ALT: 15 U/L (ref 0–44)
AST: 20 U/L (ref 15–41)
Albumin: 4.7 g/dL (ref 3.5–5.0)
Alkaline Phosphatase: 67 U/L (ref 38–126)
Anion gap: 9 (ref 5–15)
BUN: 11 mg/dL (ref 8–23)
CO2: 29 mmol/L (ref 22–32)
Calcium: 9.6 mg/dL (ref 8.9–10.3)
Chloride: 103 mmol/L (ref 98–111)
Creatinine, Ser: 0.82 mg/dL (ref 0.44–1.00)
GFR calc Af Amer: >60 mL/min (ref >60)
GFR calc non Af Amer: >60 mL/min (ref >60)
Glucose, Bld: 98 mg/dL (ref 70–99)
Potassium: 4.3 mmol/L (ref 3.5–5.1)
Sodium: 141 mmol/L (ref 135–145)
Total Bilirubin: 1.2 mg/dL (ref 0.3–1.2)
Total Protein: 6.6 g/dL (ref 6.5–8.1)

## 2018-07-05 LAB — LACTATE DEHYDROGENASE: LDH: 136 U/L (ref 98–192)

## 2018-07-05 NOTE — Progress Notes (Signed)
No new changes noted today 

## 2018-07-06 ENCOUNTER — Encounter: Payer: Self-pay | Admitting: *Deleted

## 2018-07-06 LAB — HEPATITIS B CORE ANTIBODY, TOTAL: Hep B Core Total Ab: NEGATIVE

## 2018-07-11 ENCOUNTER — Encounter: Payer: Self-pay | Admitting: Family Medicine

## 2018-07-11 ENCOUNTER — Other Ambulatory Visit: Payer: Self-pay

## 2018-07-11 ENCOUNTER — Ambulatory Visit (INDEPENDENT_AMBULATORY_CARE_PROVIDER_SITE_OTHER): Payer: Medicare HMO | Admitting: Family Medicine

## 2018-07-11 VITALS — BP 133/76 | HR 77 | Temp 98.1°F | Ht 66.0 in | Wt 153.0 lb

## 2018-07-11 DIAGNOSIS — C9110 Chronic lymphocytic leukemia of B-cell type not having achieved remission: Secondary | ICD-10-CM | POA: Diagnosis not present

## 2018-07-11 DIAGNOSIS — I10 Essential (primary) hypertension: Secondary | ICD-10-CM

## 2018-07-11 DIAGNOSIS — F3342 Major depressive disorder, recurrent, in full remission: Secondary | ICD-10-CM

## 2018-07-11 DIAGNOSIS — J3089 Other allergic rhinitis: Secondary | ICD-10-CM | POA: Diagnosis not present

## 2018-07-11 DIAGNOSIS — E7800 Pure hypercholesterolemia, unspecified: Secondary | ICD-10-CM

## 2018-07-11 DIAGNOSIS — F419 Anxiety disorder, unspecified: Secondary | ICD-10-CM

## 2018-07-11 DIAGNOSIS — R69 Illness, unspecified: Secondary | ICD-10-CM | POA: Diagnosis not present

## 2018-07-11 MED ORDER — MONTELUKAST SODIUM 10 MG PO TABS: 10.0000 mg | ORAL_TABLET | Freq: Every day | ORAL | 1 refills | Status: DC

## 2018-07-11 MED ORDER — LORAZEPAM 1 MG PO TABS: 0.5000 mg | ORAL_TABLET | Freq: Two times a day (BID) | ORAL | 5 refills | Status: DC | PRN

## 2018-07-11 MED ORDER — CETIRIZINE HCL 10 MG PO TABS: 10.0000 mg | ORAL_TABLET | Freq: Every day | ORAL | 1 refills | Status: DC

## 2018-07-11 NOTE — Progress Notes (Signed)
BP 133/76 (BP Location: Left Arm, Patient Position: Sitting, Cuff Size: Normal)   Pulse 77   Temp 98.1 F (36.7 C) (Oral)   Ht 5\' 6"  (1.676 m)   Wt 153 lb (69.4 kg)   BMI 24.69 kg/m    Subjective:    Patient ID: Katherine [name], female    DOB: May 30, 1940, 78 y.o.   MRN: [id number]  HPI: Katherine [name] is a 78 y.o. female  Chief Complaint  Patient presents with  . Hypertension    Follow-up. No complaints.  . Anxiety  . Depression  . Hyperlipidemia  . Aortic atherosclerosis    . This visit was completed via telephone due to the restrictions of the COVID-19 pandemic. All issues as above were discussed and addressed but no physical exam was performed. If it was felt that the patient should be evaluated in the office, they were directed there. The patient verbally consented to this visit. Patient was unable to complete an audio/visual visit due to Technical difficulties,Lack of internet. Due to the catastrophic nature of the COVID-19 pandemic, this visit was done through audio contact only. . Location of the patient: home . Location of the provider: home . Those involved with this call:  . Provider: [name], PA-C . CMA: Merilyn Baba, [place] . Front Desk/Registration: Jill Side  . Time spent on call: 25 minutes on the phone discussing health concerns. 10 minutes total spent in review of patient's record and preparation of their chart. I verified patient identity using two factors (patient name and date of birth). Patient consents verbally to being seen via telemedicine visit today.   Patient here today for 6 month chronic condition f/u.   Followed by Hematology for CLL, states recently her WBC count has been elevated and she's feeling very fatigued.   Hx of depression and anxiety, under good control with paxil and lorazepam BID prn. Has some breakthrough anxiety but for the most party feels good about where things are at. Denies SI/HI.   BPs WNL when checked at  home, 120s-130s/80s. Denies Cp, SOB, dizziness HAs. Compliant with benazepril.   Taking lipitor for HLD, no myalgias, claudication, CP, SOB. Tries to watch what she eats and stay active.   Depression screen [facility] 2/9 07/11/2018 02/16/2018 01/09/2018  Decreased Interest 0 0 0  Down, Depressed, Hopeless 0 1 0  PHQ - 2 Score 0 1 0  Altered sleeping 0 - 0  Tired, decreased energy 1 - 1  Change in appetite 0 - 0  Feeling bad or failure about yourself  0 - 0  Trouble concentrating 1 - 0  Moving slowly or fidgety/restless 1 - 0  Suicidal thoughts 0 - 0  PHQ-9 Score 3 - 1  Difficult doing work/chores Not difficult at all - -   GAD 7 : Generalized Anxiety Score 07/11/2018 01/09/2018  Nervous, Anxious, on Edge 2 0  Control/stop worrying 1 0  Worry too much - different things 1 0  Trouble relaxing 0 0  Restless 2 0  Easily annoyed or irritable 1 0  Afraid - awful might happen 0 0  Total GAD 7 Score 7 0  Anxiety Difficulty Not difficult at all -     Relevant past medical, surgical, family and social history reviewed and updated as indicated. Interim medical history since our last visit reviewed. Allergies and medications reviewed and updated.  Review of Systems  Per HPI unless specifically indicated above     Objective:    BP  133/76 (BP Location: Left Arm, Patient Position: Sitting, Cuff Size: Normal)   Pulse 77   Temp 98.1 F (36.7 C) (Oral)   Ht 5\' 6"  (1.676 m)   Wt 153 lb (69.4 kg)   BMI 24.69 kg/m   Wt Readings from Last 3 Encounters:  07/11/18 153 lb (69.4 kg)  07/05/18 153 lb 14.1 oz (69.8 kg)  05/01/18 153 lb (69.4 kg)    Physical Exam  Unable to perform PE as patient did not have capability for video visit today  Results for orders placed or performed in visit on 07/05/18  Hepatitis B core antibody, total  Result Value Ref Range   Hep B Core Total Ab Negative Negative  Lactate dehydrogenase  Result Value Ref Range   LDH 136 98 - 192 U/L  Comprehensive  metabolic panel  Result Value Ref Range   Sodium 141 135 - 145 mmol/L   Potassium 4.3 3.5 - 5.1 mmol/L   Chloride 103 98 - 111 mmol/L   CO2 29 22 - 32 mmol/L   Glucose, Bld 98 70 - 99 mg/dL   BUN 11 8 - 23 mg/dL   Creatinine, Ser 0.82 0.44 - 1.00 mg/dL   Calcium 9.6 8.9 - 10.3 mg/dL   Total Protein 6.6 6.5 - 8.1 g/dL   Albumin 4.7 3.5 - 5.0 g/dL   AST 20 15 - 41 U/L   ALT 15 0 - 44 U/L   Alkaline Phosphatase 67 38 - 126 U/L   Total Bilirubin 1.2 0.3 - 1.2 mg/dL   GFR calc non Af Amer >60 >60 mL/min   GFR calc Af Amer >60 >60 mL/min   Anion gap 9 5 - 15  CBC with Differential/Platelet  Result Value Ref Range   WBC 35.3 (H) 4.0 - 10.5 K/uL   RBC 4.46 3.87 - 5.11 MIL/uL   Hemoglobin 14.9 12.0 - 15.0 g/dL   HCT 44.3 36.0 - 46.0 %   MCV 99.3 80.0 - 100.0 fL   MCH 33.4 26.0 - 34.0 pg   MCHC 33.6 30.0 - 36.0 g/dL   RDW 12.7 11.5 - 15.5 %   Platelets 141 (L) 150 - 400 K/uL   nRBC 0.0 0.0 - 0.2 %   Neutrophils Relative % 8 %   Neutro Abs 2.8 1.7 - 7.7 K/uL   Lymphocytes Relative 84 %   Lymphs Abs 29.6 (H) 0.7 - 4.0 K/uL   Monocytes Relative 7 %   Monocytes Absolute 2.4 (H) 0.1 - 1.0 K/uL   Eosinophils Relative 1 %   Eosinophils Absolute 0.3 0.0 - 0.5 K/uL   Basophils Relative 0 %   Basophils Absolute 0.1 0.0 - 0.1 K/uL   Immature Granulocytes 0 %   Abs Immature Granulocytes 0.04 0.00 - 0.07 K/uL      Assessment & Plan:   Problem List Items Addressed This Visit      Cardiovascular and Mediastinum   Essential hypertension - Primary    Stable and under good control, continue current regimen        Respiratory   Allergic rhinitis    Restart singulair, continue zyrtec and flonase        Other   Depression    Stable and under good control, continue current regimen      Relevant Medications   LORazepam (ATIVAN) 1 MG tablet   CLL (chronic lymphocytic leukemia) (HCC)    Followed by Hematology, continue per their recommendation      Relevant Medications  cetirizine (ZYRTEC) 10 MG tablet   LORazepam (ATIVAN) 1 MG tablet   Hypercholesterolemia    Last lipid less than a year ago under excellent control. UTD on all other monitoring labs due to frequent draws through Hematology, agreeable to holding off until CPE to recheck lipids. Continue current regimen      Anxiety    Stable and under good control with paxil and ativan prn. Continue current regimen      Relevant Medications   LORazepam (ATIVAN) 1 MG tablet       Follow up plan: Return in about 6 months (around 01/11/2019) for CPE as scheduled.

## 2018-07-13 ENCOUNTER — Telehealth: Payer: Self-pay

## 2018-07-13 NOTE — Telephone Encounter (Signed)
Spoke with Katherine [name] to inform her that her Hep B results were negative. The patient was understanding and agreeable with results today.

## 2018-07-13 NOTE — Telephone Encounter (Signed)
-----   Message from Secundino Ginger sent at 07/13/2018  9:18 AM EDT ----- Regarding: hep b results Contact: [phone] Katherine [name] called and said she hasn't heard back about her Hep B results. She wants someone to call her.

## 2018-07-18 DIAGNOSIS — J309 Allergic rhinitis, unspecified: Secondary | ICD-10-CM | POA: Insufficient documentation

## 2018-07-18 NOTE — Assessment & Plan Note (Signed)
Last lipid less than a year ago under excellent control. UTD on all other monitoring labs due to frequent draws through Hematology, agreeable to holding off until CPE to recheck lipids. Continue current regimen

## 2018-07-18 NOTE — Assessment & Plan Note (Signed)
Followed by Hematology, continue per their recommendation

## 2018-07-18 NOTE — Assessment & Plan Note (Signed)
Stable and under good control with paxil and ativan prn. Continue current regimen

## 2018-07-18 NOTE — Assessment & Plan Note (Signed)
Stable and under good control, continue current regimen 

## 2018-07-18 NOTE — Assessment & Plan Note (Signed)
Restart singulair, continue zyrtec and flonase

## 2018-07-24 ENCOUNTER — Telehealth: Payer: Self-pay | Admitting: Family Medicine

## 2018-07-24 NOTE — Telephone Encounter (Signed)
Declined AWV  °

## 2018-08-17 ENCOUNTER — Encounter: Payer: Self-pay | Admitting: Family Medicine

## 2018-08-17 ENCOUNTER — Other Ambulatory Visit: Payer: Self-pay

## 2018-08-17 ENCOUNTER — Ambulatory Visit (INDEPENDENT_AMBULATORY_CARE_PROVIDER_SITE_OTHER): Payer: Medicare HMO | Admitting: Family Medicine

## 2018-08-17 VITALS — Temp 98.5°F | Ht 66.0 in | Wt 151.0 lb

## 2018-08-17 DIAGNOSIS — W57XXXA Bitten or stung by nonvenomous insect and other nonvenomous arthropods, initial encounter: Secondary | ICD-10-CM | POA: Diagnosis not present

## 2018-08-17 DIAGNOSIS — R21 Rash and other nonspecific skin eruption: Secondary | ICD-10-CM | POA: Diagnosis not present

## 2018-08-17 NOTE — Progress Notes (Signed)
Temp 98.5 F (36.9 C) (Oral)   Ht 5\' 6"  (1.676 m)   Wt 151 lb (68.5 kg)   BMI 24.37 kg/m    Subjective:    Patient ID: Katherine [name], female    DOB: [birth year], 78 y.o.   MRN: [id number]  HPI: Illana [name] is a 78 y.o. female  Chief Complaint  Patient presents with  . Tick Removal    Noticed tick on right buttox on the 15th. Tick was removed. Patient now has rash on right side of buttox and soreness.     . This visit was completed via telephone due to the restrictions of the COVID-19 pandemic. All issues as above were discussed and addressed. Physical exam was done as above through visual confirmation on telephone. If it was felt that the patient should be evaluated in the office, they were directed there. The patient verbally consented to this visit. . Location of the patient: home . Location of the provider: work . Those involved with this call:  . Provider: [name], PA-C . CMA: Merilyn Baba, [place] . Front Desk/Registration: Jill Side  . Time spent on call: 15 minutes on the phone discussing health concerns. 5 minutes total spent in review of patient's record and preparation of their chart. I verified patient identity using two factors (patient name and date of birth). Patient consents verbally to being seen via telemedicine visit today.   Pulled a tick off right buttock 10 days ago and since has had a red, swollen itchy area. Just tried betamethasone cream today for the first time and does note some improvement. No other rashes, fevers, body aches, myalgias, HAs.    Relevant past medical, surgical, family and social history reviewed and updated as indicated. Interim medical history since our last visit reviewed. Allergies and medications reviewed and updated.  Review of Systems  Per HPI unless specifically indicated above     Objective:    Temp 98.5 F (36.9 C) (Oral)   Ht 5\' 6"  (1.676 m)   Wt 151 lb (68.5 kg)   BMI 24.37 kg/m   Wt Readings from  Last 3 Encounters:  08/17/18 151 lb (68.5 kg)  07/11/18 153 lb (69.4 kg)  07/05/18 153 lb 14.1 oz (69.8 kg)    Physical Exam  Unable to perform PE due to patient lack of access to video technology  Results for orders placed or performed in visit on 07/05/18  Hepatitis B core antibody, total  Result Value Ref Range   Hep B Core Total Ab Negative Negative  Lactate dehydrogenase  Result Value Ref Range   LDH 136 98 - 192 U/L  Comprehensive metabolic panel  Result Value Ref Range   Sodium 141 135 - 145 mmol/L   Potassium 4.3 3.5 - 5.1 mmol/L   Chloride 103 98 - 111 mmol/L   CO2 29 22 - 32 mmol/L   Glucose, Bld 98 70 - 99 mg/dL   BUN 11 8 - 23 mg/dL   Creatinine, Ser 0.82 0.44 - 1.00 mg/dL   Calcium 9.6 8.9 - 10.3 mg/dL   Total Protein 6.6 6.5 - 8.1 g/dL   Albumin 4.7 3.5 - 5.0 g/dL   AST 20 15 - 41 U/L   ALT 15 0 - 44 U/L   Alkaline Phosphatase 67 38 - 126 U/L   Total Bilirubin 1.2 0.3 - 1.2 mg/dL   GFR calc non Af Amer >60 >60 mL/min   GFR calc Af Amer >60 >60 mL/min  Anion gap 9 5 - 15  CBC with Differential/Platelet  Result Value Ref Range   WBC 35.3 (H) 4.0 - 10.5 K/uL   RBC 4.46 3.87 - 5.11 MIL/uL   Hemoglobin 14.9 12.0 - 15.0 g/dL   HCT 44.3 36.0 - 46.0 %   MCV 99.3 80.0 - 100.0 fL   MCH 33.4 26.0 - 34.0 pg   MCHC 33.6 30.0 - 36.0 g/dL   RDW 12.7 11.5 - 15.5 %   Platelets 141 (L) 150 - 400 K/uL   nRBC 0.0 0.0 - 0.2 %   Neutrophils Relative % 8 %   Neutro Abs 2.8 1.7 - 7.7 K/uL   Lymphocytes Relative 84 %   Lymphs Abs 29.6 (H) 0.7 - 4.0 K/uL   Monocytes Relative 7 %   Monocytes Absolute 2.4 (H) 0.1 - 1.0 K/uL   Eosinophils Relative 1 %   Eosinophils Absolute 0.3 0.0 - 0.5 K/uL   Basophils Relative 0 %   Basophils Absolute 0.1 0.0 - 0.1 K/uL   Immature Granulocytes 0 %   Abs Immature Granulocytes 0.04 0.00 - 0.07 K/uL      Assessment & Plan:   Problem List Items Addressed This Visit    None    Visit Diagnoses    Rash    -  Primary   Unable to  visualize, but per description sounds inflammatory/allergic rather than infected. Compresses, steroid cream. Strict return precautions reviewed   Tick bite, initial encounter           Follow up plan: Return if symptoms worsen or fail to improve.

## 2018-08-21 ENCOUNTER — Ambulatory Visit: Payer: Self-pay

## 2018-08-21 NOTE — Telephone Encounter (Signed)
This is [facility] patient is he aware?

## 2018-08-21 NOTE — Telephone Encounter (Signed)
Returned call to patient who states that she was bitten 2 weeks ago by a small tick.  She states that tick bit was on her right buttock. She had a virtual visit at that time and she was told to call back for any problems.  Today she has a red area around the tick bit the size of a baseball with a red raised center.  She also has pain behind her right ear. Care advice read to patient. Note will be routed to office for appointment scheduling. Pt is aware that office is closed for lunch. Katherine [name] was encouraged to call back if she did not hear from the office by 3 pm today. She verbalized understanding.  Reason for Disposition . Red ring or bull's-eye rash occurs at tick bite  Answer Assessment - Initial Assessment Questions 1. TYPE of TICK: "Is it a wood tick or a deer tick?" If unsure, ask: "What size was the tick?" "Did it look more like a watermelon seed or a poppy seed?"      Deer tick 2. LOCATION: "Where is the tick bite located?"      Rt ear sorness tick rt buttock 3. ONSET: "How long do you think the tick was attached before you removed it?" (Hours or days)      unsure 4. TETANUS: "When was the last tetanus booster?"     Not due 5. PREGNANCY: "Is there any chance you are pregnant?" "When was your last menstrual period?"    N/A  Protocols used: TICK BITE-A-AH

## 2018-08-22 ENCOUNTER — Encounter: Payer: Self-pay | Admitting: Nurse Practitioner

## 2018-08-22 ENCOUNTER — Other Ambulatory Visit: Payer: Self-pay

## 2018-08-22 ENCOUNTER — Ambulatory Visit (INDEPENDENT_AMBULATORY_CARE_PROVIDER_SITE_OTHER): Payer: Medicare HMO | Admitting: Nurse Practitioner

## 2018-08-22 VITALS — BP 122/75 | HR 86 | Temp 98.8°F

## 2018-08-22 DIAGNOSIS — L03317 Cellulitis of buttock: Secondary | ICD-10-CM | POA: Insufficient documentation

## 2018-08-22 MED ORDER — DOXYCYCLINE HYCLATE 100 MG PO TABS: 100.0000 mg | ORAL_TABLET | Freq: Two times a day (BID) | ORAL | 0 refills | Status: AC

## 2018-08-22 MED ORDER — BETAMETHASONE VALERATE 0.1 % EX CREA: TOPICAL_CREAM | Freq: Two times a day (BID) | CUTANEOUS | 0 refills | Status: DC

## 2018-08-22 NOTE — Assessment & Plan Note (Signed)
Acute to tick bite right buttock.  Will obtain labs due to complaint occasional fatigue and diaphoresis x 2.  Doxycycline script sent due to ongoing issues to area of bite.  New script for steroid cream sent, one she is using is expired.  Continue alternate warm and cool compress.  Return in 2 weeks for follow-up.

## 2018-08-22 NOTE — Progress Notes (Signed)
BP 122/75   Pulse 86   Temp 98.8 F (37.1 C) (Oral)   SpO2 94%    Subjective:    Patient ID: Katherine [name], female    DOB: May 25, 1940, 78 y.o.   MRN: [id number]  HPI: Katherine [name] is a 78 y.o. female  Chief Complaint  Patient presents with  . Tick Removal    pt states she removed a tick from her buttocks 14 days ago, states she now has a rash that itches and burns    TICK BITE: States she noticed tick on her two weeks and three days ago.  Rash and redness showed up days after, she does not recall how many.  Reports it is not getting better, using ointment recommended on 08/17/18. Betamethasone and Neosporin. Has been using cold and warm compresses. Duration: two weeks Location: right buttock Onset: two weeks ago Itching: yes Status: worse Treatments attempted:  Fever: no Chills: no headache: no Muscle pain: no Rash: yes  Relevant past medical, surgical, family and social history reviewed and updated as indicated. Interim medical history since our last visit reviewed. Allergies and medications reviewed and updated.  Review of Systems  Constitutional: Positive for diaphoresis (x 2 episodes) and fatigue. Negative for activity change, appetite change and fever.  Respiratory: Negative for cough, chest tightness, shortness of breath and wheezing.   Cardiovascular: Negative for chest pain, palpitations and leg swelling.  Gastrointestinal: Negative for abdominal distention, abdominal pain, constipation, diarrhea, nausea and vomiting.  Skin: Positive for rash.  Neurological: Negative for dizziness, syncope, weakness, light-headedness, numbness and headaches.  Psychiatric/Behavioral: Negative.     Per HPI unless specifically indicated above     Objective:    BP 122/75   Pulse 86   Temp 98.8 F (37.1 C) (Oral)   SpO2 94%   Wt Readings from Last 3 Encounters:  08/17/18 151 lb (68.5 kg)  07/11/18 153 lb (69.4 kg)  07/05/18 153 lb 14.1 oz (69.8 kg)     Physical Exam Vitals signs and nursing note reviewed.  Constitutional:      General: She is awake. She is not in acute distress.    Appearance: She is well-developed. She is not ill-appearing.  HENT:     Head: Normocephalic.     Right Ear: Hearing normal.     Left Ear: Hearing normal.     Nose: Nose normal.     Mouth/Throat:     Mouth: Mucous membranes are moist.  Eyes:     General: Lids are normal.        Right eye: No discharge.        Left eye: No discharge.     Conjunctiva/sclera: Conjunctivae normal.     Pupils: Pupils are equal, round, and reactive to light.  Neck:     Musculoskeletal: Normal range of motion and neck supple.     Thyroid: No thyromegaly.     Vascular: No carotid bruit or JVD.  Cardiovascular:     Rate and Rhythm: Normal rate and regular rhythm.     Heart sounds: Normal heart sounds. No murmur. No gallop.   Pulmonary:     Effort: Pulmonary effort is normal. No accessory muscle usage or respiratory distress.     Breath sounds: Normal breath sounds.  Abdominal:     General: Bowel sounds are normal.     Palpations: Abdomen is soft. There is no hepatomegaly or splenomegaly.  Musculoskeletal:     Right lower leg: No edema.  Left lower leg: No edema.  Lymphadenopathy:     Cervical: No cervical adenopathy.  Skin:    General: Skin is warm and dry.       Neurological:     Mental Status: She is alert and oriented to person, place, and time.  Psychiatric:        Attention and Perception: Attention normal.        Mood and Affect: Mood normal.        Behavior: Behavior normal. Behavior is cooperative.        Thought Content: Thought content normal.        Judgment: Judgment normal.     Results for orders placed or performed in visit on 07/05/18  Hepatitis B core antibody, total  Result Value Ref Range   Hep B Core Total Ab Negative Negative  Lactate dehydrogenase  Result Value Ref Range   LDH 136 98 - 192 U/L  Comprehensive metabolic panel   Result Value Ref Range   Sodium 141 135 - 145 mmol/L   Potassium 4.3 3.5 - 5.1 mmol/L   Chloride 103 98 - 111 mmol/L   CO2 29 22 - 32 mmol/L   Glucose, Bld 98 70 - 99 mg/dL   BUN 11 8 - 23 mg/dL   Creatinine, Ser 0.82 0.44 - 1.00 mg/dL   Calcium 9.6 8.9 - 10.3 mg/dL   Total Protein 6.6 6.5 - 8.1 g/dL   Albumin 4.7 3.5 - 5.0 g/dL   AST 20 15 - 41 U/L   ALT 15 0 - 44 U/L   Alkaline Phosphatase 67 38 - 126 U/L   Total Bilirubin 1.2 0.3 - 1.2 mg/dL   GFR calc non Af Amer >60 >60 mL/min   GFR calc Af Amer >60 >60 mL/min   Anion gap 9 5 - 15  CBC with Differential/Platelet  Result Value Ref Range   WBC 35.3 (H) 4.0 - 10.5 K/uL   RBC 4.46 3.87 - 5.11 MIL/uL   Hemoglobin 14.9 12.0 - 15.0 g/dL   HCT 44.3 36.0 - 46.0 %   MCV 99.3 80.0 - 100.0 fL   MCH 33.4 26.0 - 34.0 pg   MCHC 33.6 30.0 - 36.0 g/dL   RDW 12.7 11.5 - 15.5 %   Platelets 141 (L) 150 - 400 K/uL   nRBC 0.0 0.0 - 0.2 %   Neutrophils Relative % 8 %   Neutro Abs 2.8 1.7 - 7.7 K/uL   Lymphocytes Relative 84 %   Lymphs Abs 29.6 (H) 0.7 - 4.0 K/uL   Monocytes Relative 7 %   Monocytes Absolute 2.4 (H) 0.1 - 1.0 K/uL   Eosinophils Relative 1 %   Eosinophils Absolute 0.3 0.0 - 0.5 K/uL   Basophils Relative 0 %   Basophils Absolute 0.1 0.0 - 0.1 K/uL   Immature Granulocytes 0 %   Abs Immature Granulocytes 0.04 0.00 - 0.07 K/uL      Assessment & Plan:   Problem List Items Addressed This Visit      Other   Cellulitis of buttock - Primary    Acute to tick bite right buttock.  Will obtain labs due to complaint occasional fatigue and diaphoresis x 2.  Doxycycline script sent due to ongoing issues to area of bite.  New script for steroid cream sent, one she is using is expired.  Continue alternate warm and cool compress.  Return in 2 weeks for follow-up.      Relevant Orders   Lyme disease,  western blot   B. burgdorfi antibodies       Follow up plan: Return in about 2 weeks (around 09/05/2018) for Tick bite follow-up.

## 2018-08-22 NOTE — Patient Instructions (Signed)

## 2018-08-26 LAB — LYME DISEASE, WESTERN BLOT
IgG P18 Ab.: ABSENT
IgG P23 Ab.: ABSENT
IgG P28 Ab.: ABSENT
IgG P30 Ab.: ABSENT
IgG P39 Ab.: ABSENT
IgG P41 Ab.: ABSENT
IgG P45 Ab.: ABSENT
IgG P58 Ab.: ABSENT
IgG P66 Ab.: ABSENT
IgG P93 Ab.: ABSENT
IgM P23 Ab.: ABSENT
IgM P39 Ab.: ABSENT
IgM P41 Ab.: ABSENT
Lyme IgG Wb: NEGATIVE
Lyme IgM Wb: NEGATIVE

## 2018-08-26 LAB — B. BURGDORFI ANTIBODIES: Lyme IgG/IgM Ab: <0.91 {ISR} (ref 0.00–0.90)

## 2018-09-06 ENCOUNTER — Encounter: Payer: Self-pay | Admitting: Nurse Practitioner

## 2018-09-06 ENCOUNTER — Other Ambulatory Visit: Payer: Self-pay

## 2018-09-06 ENCOUNTER — Ambulatory Visit (INDEPENDENT_AMBULATORY_CARE_PROVIDER_SITE_OTHER): Payer: Medicare HMO | Admitting: Nurse Practitioner

## 2018-09-06 VITALS — BP 103/66 | HR 88 | Temp 98.4°F | Ht 66.0 in | Wt 153.0 lb

## 2018-09-06 DIAGNOSIS — L03317 Cellulitis of buttock: Secondary | ICD-10-CM | POA: Diagnosis not present

## 2018-09-06 DIAGNOSIS — C9110 Chronic lymphocytic leukemia of B-cell type not having achieved remission: Secondary | ICD-10-CM

## 2018-09-06 NOTE — Patient Instructions (Signed)

## 2018-09-06 NOTE — Progress Notes (Signed)
BP 103/66   Pulse 88   Temp 98.4 F (36.9 C) (Oral)   Ht 5\' 6"  (1.676 m)   Wt 153 lb (69.4 kg)   SpO2 92%   BMI 24.69 kg/m    Subjective:    Patient ID: Katherine [name], female    DOB: Mar 12, 1940, 78 y.o.   MRN: [id number]  HPI: Katherine [name] is a 78 y.o. female  Chief Complaint  Patient presents with  . Tick Removal    2w f/u  . Fatigue    right thigh numbness   TICK BITE FOLLOW-UP: Seen on 08/22/18 for tick bite with infection to area, treated with Doxycycline and labs performed due to symptoms.  Labs showed returned negative for tick borne disease.  States she is feeling better today and reports tick bite has improved.  FATIGUE WITH CLL At last visit she reported some fatigue, which she reports increase with abx use.  Prior to abx use had fatigue for 3-4 weeks.  Does have chronic lymphocytic leukemia, followed by oncology, returns to see them next month.  She does not want labs today, prefers to have performed with oncology next month.  Reports fatigue is baseline with her and at times is worse then others. Duration:  chronic Severity: mild  Onset: gradual Context when symptoms started:  CLL Symptoms improve with rest: yes  Depressive symptoms: no Stress/anxiety: no Insomnia: none Snoring: no Observed apnea by bed partner: no Daytime hypersomnolence:no Wakes feeling refreshed: yes History of sleep study: no Dysnea on exertion:  no Orthopnea/PND: no Chest pain: no Chronic cough: no Lower extremity edema: no Arthralgias:no Myalgias: no Weakness: no Rash: no  Relevant past medical, surgical, family and social history reviewed and updated as indicated. Interim medical history since our last visit reviewed. Allergies and medications reviewed and updated.  Review of Systems  Constitutional: Positive for fatigue (intermittent). Negative for activity change, appetite change, diaphoresis and fever.  Respiratory: Negative for cough, chest tightness and  shortness of breath.   Cardiovascular: Negative for chest pain, palpitations and leg swelling.  Gastrointestinal: Negative for abdominal distention, abdominal pain, constipation, diarrhea, nausea and vomiting.  Neurological: Negative for dizziness, syncope, weakness, light-headedness, numbness and headaches.  Psychiatric/Behavioral: Negative.     Per HPI unless specifically indicated above     Objective:    BP 103/66   Pulse 88   Temp 98.4 F (36.9 C) (Oral)   Ht 5\' 6"  (1.676 m)   Wt 153 lb (69.4 kg)   SpO2 92%   BMI 24.69 kg/m   Wt Readings from Last 3 Encounters:  09/06/18 153 lb (69.4 kg)  08/17/18 151 lb (68.5 kg)  07/11/18 153 lb (69.4 kg)    Physical Exam Vitals signs and nursing note reviewed.  Constitutional:      General: She is awake. She is not in acute distress.    Appearance: She is well-developed. She is not ill-appearing.  HENT:     Head: Normocephalic.     Right Ear: Hearing normal.     Left Ear: Hearing normal.     Nose: Nose normal.     Mouth/Throat:     Mouth: Mucous membranes are moist.  Eyes:     General: Lids are normal.        Right eye: No discharge.        Left eye: No discharge.     Conjunctiva/sclera: Conjunctivae normal.     Pupils: Pupils are equal, round, and reactive to light.  Neck:     Musculoskeletal: Normal range of motion and neck supple.     Thyroid: No thyromegaly.     Vascular: No carotid bruit or JVD.  Cardiovascular:     Rate and Rhythm: Normal rate and regular rhythm.     Heart sounds: Normal heart sounds. No murmur. No gallop.   Pulmonary:     Effort: Pulmonary effort is normal. No accessory muscle usage or respiratory distress.     Breath sounds: Normal breath sounds.  Abdominal:     General: Bowel sounds are normal.     Palpations: Abdomen is soft. There is no hepatomegaly or splenomegaly.  Musculoskeletal:     Right lower leg: No edema.     Left lower leg: No edema.  Lymphadenopathy:     Cervical: No cervical  adenopathy.  Skin:    General: Skin is warm and dry.       Neurological:     Mental Status: She is alert and oriented to person, place, and time.  Psychiatric:        Attention and Perception: Attention normal.        Mood and Affect: Mood normal.        Behavior: Behavior normal. Behavior is cooperative.        Thought Content: Thought content normal.        Judgment: Judgment normal.     Results for orders placed or performed in visit on 08/22/18  Lyme disease, western blot  Result Value Ref Range     IgG P93 Ab. Absent      IgG P66 Ab. Absent      IgG P58 Ab. Absent      IgG P45 Ab. Absent      IgG P41 Ab. Absent      IgG P39 Ab. Absent      IgG P30 Ab. Absent      IgG P28 Ab. Absent      IgG P23 Ab. Absent      IgG P18 Ab. Absent    Lyme IgG Wb Negative      IgM P41 Ab. Absent      IgM P39 Ab. Absent      IgM P23 Ab. Absent    Lyme IgM Wb Negative   B. burgdorfi antibodies  Result Value Ref Range   Lyme IgG/IgM Ab <0.91 0.00 - 0.90 ISR      Assessment & Plan:   Problem List Items Addressed This Visit      Other   CLL (chronic lymphocytic leukemia) ([place]) - Primary    Suspect fatigue related to CLL.  She reports chronic fatigue with CLL, that waxes and wanes.  Continue to collaborate with oncology team.  Patient refuses labs today.  Recommend 8 hours sleep a night and increased fluid intake during daytime hours.      Cellulitis of buttock    Acute and improved.  May continue to use steroid cream as needed for comfort to area.  Return for worsening symptoms.          Follow up plan: Return if symptoms worsen or fail to improve.

## 2018-09-06 NOTE — Assessment & Plan Note (Signed)
Acute and improved.  May continue to use steroid cream as needed for comfort to area.  Return for worsening symptoms.

## 2018-09-06 NOTE — Assessment & Plan Note (Signed)
Suspect fatigue related to CLL.  She reports chronic fatigue with CLL, that waxes and wanes.  Continue to collaborate with oncology team.  Patient refuses labs today.  Recommend 8 hours sleep a night and increased fluid intake during daytime hours.

## 2018-10-02 NOTE — Progress Notes (Signed)
[facility]  [address] [place],  [address] Phone: [phone]  Fax: [phone]   Clinic Day:  10/04/2018  Referring physician: [name], MD  Chief Complaint: Katherine [name] is a 78 y.o. female with chronic lymphocytic leukemia (CLL) who is seen for 3 month assessment.  HPI: The patient was last seen in the medical oncology clinic on 07/05/2018 for new patient assessment.  At that time, she felt "ok".  She denied any B symptoms.  She denied any early satiety.  Weight was stable.  Exam revealed no adenopathy or hepatosplenomegaly.  She had a right sided wheeze.  WBC was 35,300.  Platelet count was 141,000.  LDH was 136.  Low dose chest CT on 07/05/2018 revealed Lung-RADS 2, benign appearance or behavior. Prior right middle lobe endobronchial lesion/mucous plugging was no longer visualized. New mucous plugging in the right lower lobe was noted. Continue annual screening with low-dose chest CT without contrast in 12 months.  She had a tick bit on June 15th, which resulted in a bull's eye rash. She was put on doxycycline and given topical steroid ointment with improvement.   During the interim, she has been "alright."  She has been depressed due to the COVID-19 pandemic. She is fatigued, which she attributes to the hot weather. She denies any fevers, sweats, or weight loss. She denies any new lumps or bumps. She continues to have seasonal allergies. She notes one episode of diarrhea on Monday, which has resolved.   Blood pressure in the clinic today was 96/57. She denies any dizziness or lightheadedness.    Past Medical History:  Diagnosis Date  . Allergy   . Anxiety   . Depression   . GERD (gastroesophageal reflux disease)   . Hemorrhoids   . Leukemia, lymphoid ([place])    CLL  . Lobar pneumonia ([place])   . Osteopenia   . Personal history of chemotherapy     Past Surgical History:  Procedure Laterality Date  . ABDOMINAL  HYSTERECTOMY    . APPENDECTOMY    . BREAST BIOPSY Left    bx x 3-neg  . COLON SURGERY     sigmoid resection  . COLONOSCOPY     2007, 2012  . COLONOSCOPY WITH PROPOFOL N/A 09/17/2015   Procedure: COLONOSCOPY WITH PROPOFOL;  Surgeon: Robert Bellow, MD;  [facility] ENDOSCOPY;  Service: Endoscopy;  Laterality: N/A;  . SPINE SURGERY     L4-5    Family History  Problem Relation Age of Onset  . Osteoporosis Mother   . Hypertension Father   . Heart attack Father   . Stroke Maternal Grandfather   . Breast cancer Sister 11    Social History:  reports that she has been smoking cigarettes. She has a 8.75 pack-year smoking history. She has never used smokeless tobacco. She reports that she does not drink alcohol or use drugs. She currently smokes 5 cigarettes daily, but is working toward quitting.  She previously smoked a pack a day for a "long time". She lives in [place], [place]. The patient is alone today.  Allergies:  Allergies  Allergen Reactions  . Augmentin [Amoxicillin-Pot Clavulanate] Swelling    tongue  . Biaxin [Clarithromycin] Swelling and Rash    tongue    Current Medications: Current Outpatient Medications  Medication Sig Dispense Refill  . aspirin EC 81 MG tablet Take 81 mg by mouth every other day.     Marland Kitchen atorvastatin (LIPITOR) 10 MG tablet Take 1  tablet (10 mg total) by mouth daily. 90 tablet 4  . benazepril (LOTENSIN) 40 MG tablet Take 1 tablet (40 mg total) by mouth daily. 90 tablet 4  . diphenhydrAMINE (BENADRYL) 25 MG tablet Take 25 mg by mouth at bedtime as needed.    Marland Kitchen LORazepam (ATIVAN) 1 MG tablet Take 0.5 tablets (0.5 mg total) by mouth 2 (two) times daily as needed for anxiety. 30 tablet 5  . Multiple Vitamins-Minerals (MULTIVITAMIN WITH MINERALS) tablet Take 1 tablet by mouth daily.    Marland Kitchen omeprazole (PRILOSEC) 20 MG capsule Take 1 capsule (20 mg total) by mouth daily as needed. 90 capsule 4  . PARoxetine (PAXIL) 30 MG tablet Take 1 tablet (30 mg total) by  mouth daily. 90 tablet 4  . Triamcinolone Acetonide (NASACORT AQ NA) Place 2 Doses into the nose daily as needed.     . betamethasone valerate (VALISONE) 0.1 % cream Apply topically 2 (two) times daily. (Patient not taking: Reported on 10/04/2018) 30 g 0   No current facility-administered medications for this visit.     Review of Systems  Constitutional: Positive for malaise/fatigue and weight loss (1lb). Negative for chills, diaphoresis and fever.       Doing "alright."  HENT: Negative.  Negative for congestion, hearing loss, nosebleeds, sinus pain and sore throat.   Eyes: Negative.  Negative for blurred vision, double vision, photophobia and pain.  Respiratory: Negative for cough, hemoptysis, sputum production, shortness of breath and wheezing.   Cardiovascular: Negative.  Negative for chest pain, palpitations, orthopnea, leg swelling and PND.  Gastrointestinal: Negative.  Negative for abdominal pain, blood in stool, constipation, diarrhea, heartburn, melena, nausea and vomiting.  Genitourinary: Negative.  Negative for dysuria, frequency, hematuria and urgency.  Musculoskeletal: Positive for joint pain (arthritis). Negative for back pain and myalgias.  Skin: Negative.  Negative for itching and rash.  Neurological: Positive for headaches (occasionally). Negative for dizziness, tingling, speech change, focal weakness and weakness.  Endo/Heme/Allergies: Positive for environmental allergies. Bruises/bleeds easily (bruise easily).  Psychiatric/Behavioral: Positive for depression (secondary to COVID-19 pandemic). Negative for memory loss and substance abuse. The patient is not nervous/anxious and does not have insomnia.   All other systems reviewed and are negative.  Performance status (ECOG): 1  Vitals Blood pressure (!) 96/57, pulse 69, temperature (!) 97.1 F (36.2 C), temperature source Tympanic, resp. rate 18, height 5\' 6"  (1.676 m), weight 152 lb 12.5 oz (69.3 kg), SpO2 98 %.   Physical  Exam  Constitutional: She is oriented to person, place, and time. She appears well-developed and well-nourished. No distress.  HENT:  Head: Normocephalic and atraumatic.  Mouth/Throat: Oropharynx is clear and moist. No oropharyngeal exudate.  Short gray hair. Wearing a mask.  Eyes: Pupils are equal, round, and reactive to light. Conjunctivae and EOM are normal. No scleral icterus.  Glasses.  Brown eyes.  Neck: Normal range of motion. Neck supple.  Cardiovascular: Normal rate, regular rhythm and normal heart sounds. Exam reveals no gallop and no friction rub.  No murmur heard. Pulmonary/Chest: Effort normal and breath sounds normal. No respiratory distress. She has no wheezes.  Abdominal: Soft. Bowel sounds are normal. She exhibits no distension and no mass. There is no abdominal tenderness. There is no rebound and no guarding.  Musculoskeletal: Normal range of motion.        General: Tenderness (BLE) and edema (bilateral ankles, trace) present.  Lymphadenopathy:    She has no cervical adenopathy.    She has no axillary adenopathy.  Right: No supraclavicular adenopathy present.       Left: No supraclavicular adenopathy present.  Neurological: She is alert and oriented to person, place, and time.  Skin: Skin is warm and dry. No rash noted. She is not diaphoretic. No erythema. No pallor.  Psychiatric: She has a normal mood and affect. Her behavior is normal. Judgment and thought content normal.  Nursing note and vitals reviewed.   No visits with results within 3 Day(s) from this visit.  Latest known visit with results is:  Office Visit on 08/22/2018  Component Date Value Ref Range Status  .   IgG P93 Ab. 08/22/2018 Absent   Final  .   IgG P66 Ab. 08/22/2018 Absent   Final  .   IgG P58 Ab. 08/22/2018 Absent   Final  .   IgG P45 Ab. 08/22/2018 Absent   Final  .   IgG P41 Ab. 08/22/2018 Absent   Final  .   IgG P39 Ab. 08/22/2018 Absent   Final  .   IgG P30 Ab. 08/22/2018 Absent    Final  .   IgG P28 Ab. 08/22/2018 Absent   Final  .   IgG P23 Ab. 08/22/2018 Absent   Final  .   IgG P18 Ab. 08/22/2018 Absent   Final  . Lyme IgG Wb 08/22/2018 Negative   Final   Comment:                      Positive: 5 of the following                                Borrelia-specific bands:                                18,23,28,30,39,41,45,58,                                66, and 93.                      Negative: No bands or banding                                patterns which do not                                meet positive criteria.   .   IgM P41 Ab. 08/22/2018 Absent   Final  .   IgM P39 Ab. 08/22/2018 Absent   Final  .   IgM P23 Ab. 08/22/2018 Absent   Final  . Lyme IgM Wb 08/22/2018 Negative   Final   Comment: Note: An equivocal or positive EIA result followed by a negative Line Blot result is considered NEGATIVE. An equivocal or positive EIA result followed by a positive Line Blot is considered POSITIVE by the CDC. Positive: 2 of the following bands: 23,39 or 41 Negative: No bands or banding patterns which do not meet positive criteria. Criteria for positivity are those recommended by CDC/ASTPHLD. p23=[place], p41=flagellin Note: Sera from individuals with the following may cross react in the Lyme Line Blot assays: other spirochetal diseases (periodontal disease, leptospirosis, relapsing fever, yaws, and pinta); connective  autoimmune (Rheumatoid Arthritis and Systemic Lupus Erythematosus and also individuals with Antinuclear Antibody); other infections [facility] Spotted Fever; Epstein-Barr Virus, and Cytomegalovirus).   . Lyme IgG/IgM Ab 08/22/2018 <0.91  0.00 - 0.90 ISR Final   Comment:                                 Negative         <0.91                                 Equivocal  0.91 - 1.09                                 Positive         >1.09     Assessment:  Katherine [name] is a 78 y.o. female with chronic lymphocytic leukemia (CLL).  WBC has  ranged between 22,000 - 101,7000 since 05/2011.  She has received Rituxan x2 four week cycles (06/27/2015 and 05/27/2017).  Hepatitis B surface antigen and hepatitis B surface antibody were negative on 07/04/2015.  She has a 35 pack year smoking history.  She is in the low dose chest CT program.  Low dose chest CT on 02/02/2018 revealed a new endobronchial lesion in the segmental bronchus to the medial segment of the right middle lobe. This may simply reflect an area of retained secretions, however, the possibility of an endobronchial neoplasm should be considered. Lung-RADS 4AS, suspicious.  Low dose chest CT on 07/05/2018 revealed Lung-RADS 2, benign appearance or behavior.  Prior right middle lobe endobronchial lesion/mucous plugging was no longer visualized.  New mucous plugging in the right lower lobe was noted  Symptomatically, she notes fatigue due to the hot weather and depression secondary to COVID-19 pandemic and staying inside.  Exam reveals no adenopathy or hepatosplenomegaly.  Plan: 1.   Labs today:  CBC with diff, BMP, LDH, uric acid. 2.   Chronic lymphocytic leukemia (CLL)  Clinically she is doing well.  She denies any B symptoms.  Exam reveals no adenopathy or hepatosplenomegaly.             WBC is 52,000 (available after her visit).             Re-reviewed indications for treatment.             Short-term follow-up in 3 months secondary to apparent WBC doubling time of 6 months.  Continue surveillance. 3.   Health maintenance             Patient in the lung cancer screening program.             Low-dose chest CT on 07/05/2018 was benign.     Review RML mucous plugging no longer visualized but new mucous plugging in RLL.  Continue low-dose chest CT annually. 4.     RTC in 3 months for MD assessment and labs (CBC with diff, LDH).  I discussed the assessment and treatment plan with the patient.  The patient was provided an opportunity to ask questions and all were answered.   The patient agreed with the plan and demonstrated an understanding of the instructions.  The patient was advised to call back if the symptoms worsen or if the condition fails to improve as anticipated.  I provided 15  minutes of face-to-face time during this this encounter and > 50% was spent counseling as documented under my assessment and plan.    Lequita Asal, MD, PhD    10/04/2018, 9:08 AM  I, Molly Dorshimer, am acting as Education administrator for [employer]. Mike Gip, MD, PhD.  I, Melissa C. Mike Gip, MD, have reviewed the above documentation for accuracy and completeness, and I agree with the above.

## 2018-10-04 ENCOUNTER — Encounter: Payer: Self-pay | Admitting: Hematology and Oncology

## 2018-10-04 ENCOUNTER — Inpatient Hospital Stay: Payer: Medicare HMO

## 2018-10-04 ENCOUNTER — Telehealth: Payer: Self-pay

## 2018-10-04 ENCOUNTER — Other Ambulatory Visit: Payer: Self-pay

## 2018-10-04 ENCOUNTER — Inpatient Hospital Stay: Payer: Medicare HMO | Attending: Hematology and Oncology | Admitting: Hematology and Oncology

## 2018-10-04 VITALS — BP 96/57 | HR 69 | Temp 97.1°F | Resp 18 | Ht 66.0 in | Wt 152.8 lb

## 2018-10-04 DIAGNOSIS — F17210 Nicotine dependence, cigarettes, uncomplicated: Secondary | ICD-10-CM | POA: Diagnosis not present

## 2018-10-04 DIAGNOSIS — F329 Major depressive disorder, single episode, unspecified: Secondary | ICD-10-CM | POA: Diagnosis not present

## 2018-10-04 DIAGNOSIS — Z803 Family history of malignant neoplasm of breast: Secondary | ICD-10-CM | POA: Diagnosis not present

## 2018-10-04 DIAGNOSIS — C9110 Chronic lymphocytic leukemia of B-cell type not having achieved remission: Secondary | ICD-10-CM | POA: Insufficient documentation

## 2018-10-04 DIAGNOSIS — Z90710 Acquired absence of both cervix and uterus: Secondary | ICD-10-CM | POA: Diagnosis not present

## 2018-10-04 DIAGNOSIS — Z7982 Long term (current) use of aspirin: Secondary | ICD-10-CM | POA: Insufficient documentation

## 2018-10-04 DIAGNOSIS — Z79899 Other long term (current) drug therapy: Secondary | ICD-10-CM | POA: Diagnosis not present

## 2018-10-04 DIAGNOSIS — R69 Illness, unspecified: Secondary | ICD-10-CM | POA: Diagnosis not present

## 2018-10-04 DIAGNOSIS — F419 Anxiety disorder, unspecified: Secondary | ICD-10-CM | POA: Insufficient documentation

## 2018-10-04 DIAGNOSIS — M8580 Other specified disorders of bone density and structure, unspecified site: Secondary | ICD-10-CM | POA: Insufficient documentation

## 2018-10-04 LAB — BASIC METABOLIC PANEL
Anion gap: 13 (ref 5–15)
BUN: 12 mg/dL (ref 8–23)
CO2: 25 mmol/L (ref 22–32)
Calcium: 9.7 mg/dL (ref 8.9–10.3)
Chloride: 102 mmol/L (ref 98–111)
Creatinine, Ser: 0.88 mg/dL (ref 0.44–1.00)
GFR calc Af Amer: >60 mL/min (ref >60)
GFR calc non Af Amer: >60 mL/min (ref >60)
Glucose, Bld: 109 mg/dL — ABNORMAL HIGH (ref 70–99)
Potassium: 4.3 mmol/L (ref 3.5–5.1)
Sodium: 140 mmol/L (ref 135–145)

## 2018-10-04 LAB — CBC WITH DIFFERENTIAL/PLATELET
Abs Immature Granulocytes: 0.07 10*3/uL (ref 0.00–0.07)
Basophils Absolute: 0.2 10*3/uL — ABNORMAL HIGH (ref 0.0–0.1)
Basophils Relative: 0 %
Eosinophils Absolute: 0.4 10*3/uL (ref 0.0–0.5)
Eosinophils Relative: 1 %
HCT: 44.8 % (ref 36.0–46.0)
Hemoglobin: 15.1 g/dL — ABNORMAL HIGH (ref 12.0–15.0)
Immature Granulocytes: 0 %
Lymphocytes Relative: 86 %
Lymphs Abs: 44.5 10*3/uL — ABNORMAL HIGH (ref 0.7–4.0)
MCH: 33 pg (ref 26.0–34.0)
MCHC: 33.7 g/dL (ref 30.0–36.0)
MCV: 97.8 fL (ref 80.0–100.0)
Monocytes Absolute: 3.8 10*3/uL — ABNORMAL HIGH (ref 0.1–1.0)
Monocytes Relative: 7 %
Neutro Abs: 3.1 10*3/uL (ref 1.7–7.7)
Neutrophils Relative %: 6 %
Platelets: 158 10*3/uL (ref 150–400)
RBC: 4.58 MIL/uL (ref 3.87–5.11)
RDW: 13 % (ref 11.5–15.5)
WBC: 52 10*3/uL (ref 4.0–10.5)
nRBC: 0 % (ref 0.0–0.2)

## 2018-10-04 LAB — URIC ACID: Uric Acid, Serum: 5.3 mg/dL (ref 2.5–7.1)

## 2018-10-04 LAB — LACTATE DEHYDROGENASE: LDH: 140 U/L (ref 98–192)

## 2018-10-04 NOTE — Progress Notes (Signed)
The patient c/o fatigue and having diarrhea on Sunday she stayed in bed all day long.

## 2018-10-04 NOTE — Telephone Encounter (Signed)
Contacted patient and made aware of elevated WBC. Patient informed that Dr. Mike Gip would like to change her follow up from 4 months to 3 months. Patient verbalizes understanding and denies any further questions or concerns.

## 2018-11-07 DIAGNOSIS — R69 Illness, unspecified: Secondary | ICD-10-CM | POA: Diagnosis not present

## 2018-12-04 ENCOUNTER — Other Ambulatory Visit: Payer: Self-pay | Admitting: Family Medicine

## 2018-12-04 DIAGNOSIS — Z1231 Encounter for screening mammogram for malignant neoplasm of breast: Secondary | ICD-10-CM

## 2018-12-28 ENCOUNTER — Other Ambulatory Visit: Payer: Self-pay | Admitting: Nurse Practitioner

## 2019-01-02 ENCOUNTER — Encounter: Payer: Medicare HMO | Admitting: Family Medicine

## 2019-01-04 ENCOUNTER — Inpatient Hospital Stay: Payer: Medicare HMO

## 2019-01-04 ENCOUNTER — Other Ambulatory Visit: Payer: Self-pay

## 2019-01-04 ENCOUNTER — Encounter: Payer: Self-pay | Admitting: Hematology and Oncology

## 2019-01-04 ENCOUNTER — Inpatient Hospital Stay: Payer: Medicare HMO | Admitting: Hematology and Oncology

## 2019-01-04 NOTE — Progress Notes (Signed)
Pt called for pre assessment, pt denies pain today but did state she has had some left arm difficulties.  Unable to raise left arm past shoulder level and when she tries has pain in left arm and shoulder. Pt reports she has an appt with PCP next Thursday Jan 11, 2019.

## 2019-01-04 NOTE — Progress Notes (Signed)
[facility]  [address], [address] [place], [place] [address] Phone: [phone]  Fax: [phone]   Clinic Day:  01/05/2019  Referring physician: [name], MD  Chief Complaint: Katherine [name] is a 78 y.o. female with chronic lymphocytic leukemia (CLL) who is seen for 3 month assessment.  HPI: The patient was last seen in the medical oncology clinic on 10/04/2018. At that time, she noted fatigue due to the hot weather and depression secondary to COVID-19 pandemic and staying inside. Exam revealed no adenopathy or hepatosplenomegaly. Hematocrit was 44.8, hemoglobin 15.1, platelets 158,000, and WBC was 52,000 (ANC 3100; ALC 44,500). Surveillance continued.   During the interim, she notes being depressed having to stay in doors due to COVID-19. She says her health has been "so so". She is able to to things but just at a slower pace. She has had sweats, she calls them "power surges". She will fan herself until she gets cooled down. She had one last night; they happen intermittently. She notices that if she lies on her right side she will get hot and won't get hot if she lies on her left side. She say she can deal with them.   She has has some left shoulder pain without any known cause of source of pain. She will see her PCP next week  She is scheduled for a bilateral 3D mammogram on 01/15/2019.   Past Medical History:  Diagnosis Date  . Allergy   . Anxiety   . Depression   . GERD (gastroesophageal reflux disease)   . Hemorrhoids   . Leukemia, lymphoid ([place])    CLL  . Lobar pneumonia ([place])   . Osteopenia   . Personal history of chemotherapy     Past Surgical History:  Procedure Laterality Date  . ABDOMINAL HYSTERECTOMY    . APPENDECTOMY    . BREAST BIOPSY Left    bx x 3-neg  . COLON SURGERY     sigmoid resection  . COLONOSCOPY     2007, 2012  . COLONOSCOPY WITH PROPOFOL N/A 09/17/2015   Procedure: COLONOSCOPY WITH PROPOFOL;  Surgeon:  Robert Bellow, MD;  Location[facility] ENDOSCOPY;  Service: Endoscopy;  Laterality: N/A;  . SPINE SURGERY     L4-5    Family History  Problem Relation Age of Onset  . Osteoporosis Mother   . Hypertension Father   . Heart attack Father   . Stroke Maternal Grandfather   . Breast cancer Sister 22    Social History:  reports that she has been smoking cigarettes. She has a 8.75 pack-year smoking history. She has never used smokeless tobacco. She reports that she does not drink alcohol or use drugs. She currently smokes 5 cigarettes daily, but is working toward quitting. She previously smoked a pack a day for a "longtime".She lives in [place], [place]. The patient is alone today.  Allergies:  Allergies  Allergen Reactions  . Augmentin [Amoxicillin-Pot Clavulanate] Swelling    tongue  . Biaxin [Clarithromycin] Swelling and Rash    tongue    Current Medications: Current Outpatient Medications  Medication Sig Dispense Refill  . aspirin EC 81 MG tablet Take 81 mg by mouth every other day.     Marland Kitchen atorvastatin (LIPITOR) 10 MG tablet Take 1 tablet (10 mg total) by mouth daily. 90 tablet 4  . benazepril (LOTENSIN) 40 MG tablet Take 1 tablet (40 mg total) by mouth daily. 90 tablet 4  . diphenhydrAMINE (BENADRYL) 25 MG tablet Take  25 mg by mouth at bedtime as needed.    Marland Kitchen LORazepam (ATIVAN) 1 MG tablet Take 0.5 tablets (0.5 mg total) by mouth 2 (two) times daily as needed for anxiety. 30 tablet 5  . Multiple Vitamins-Minerals (MULTIVITAMIN WITH MINERALS) tablet Take 1 tablet by mouth daily.    Marland Kitchen omeprazole (PRILOSEC) 20 MG capsule Take 1 capsule (20 mg total) by mouth daily as needed. 90 capsule 4  . PARoxetine (PAXIL) 30 MG tablet Take 1 tablet (30 mg total) by mouth daily. 90 tablet 4  . Triamcinolone Acetonide (NASACORT AQ NA) Place 2 Doses into the nose daily as needed.      No current facility-administered medications for this visit.     Review of Systems  Constitutional: Positive for  diaphoresis (hot flashes) and malaise/fatigue. Negative for chills, fever and weight loss (up 1 lb).       Feels "ok".  HENT: Negative.  Negative for congestion, ear pain, hearing loss, nosebleeds, sinus pain and sore throat.   Eyes: Negative.  Negative for blurred vision, double vision, photophobia and pain.  Respiratory: Negative.  Negative for cough, hemoptysis, sputum production and shortness of breath.   Cardiovascular: Negative.  Negative for chest pain, palpitations, orthopnea, leg swelling and PND.  Gastrointestinal: Negative.  Negative for abdominal pain, blood in stool, constipation, diarrhea, heartburn, melena, nausea and vomiting.  Genitourinary: Negative.  Negative for dysuria, frequency, hematuria and urgency.  Musculoskeletal: Positive for joint pain (arthritis). Negative for back pain, myalgias and neck pain.  Skin: Negative.  Negative for itching and rash.  Neurological: Positive for headaches (occasionally). Negative for dizziness, tingling, speech change, focal weakness and weakness.  Endo/Heme/Allergies: Negative for environmental allergies. Bruises/bleeds easily.  Psychiatric/Behavioral: Positive for depression (secondary to COVID-19 pandemic). Negative for memory loss. The patient is not nervous/anxious and does not have insomnia.   All other systems reviewed and are negative.  Performance status (ECOG): 1  Vitals Blood pressure 138/77, pulse 77, temperature (!) 97.2 F (36.2 C), temperature source Tympanic, resp. rate 18, height 5\' 6"  (1.676 m), weight 153 lb 3.5 oz (69.5 kg), SpO2 97 %.   Physical Exam  Constitutional: She is oriented to person, place, and time. She appears well-developed and well-nourished. No distress.  HENT:  Head: Normocephalic and atraumatic.  Mouth/Throat: Oropharynx is clear and moist. No oropharyngeal exudate.  Short gray hair.  Mask.  Eyes: Pupils are equal, round, and reactive to light. Conjunctivae and EOM are normal. No scleral icterus.   Glasses.  Brown eyes.  Neck: Normal range of motion. Neck supple.  Cardiovascular: Normal rate, regular rhythm and normal heart sounds. Exam reveals no gallop and no friction rub.  No murmur heard. Pulmonary/Chest: Effort normal and breath sounds normal. No respiratory distress. She has no wheezes. She has no rales.  Abdominal: Soft. Bowel sounds are normal. She exhibits no distension and no mass. There is no abdominal tenderness. There is no rebound and no guarding.  Musculoskeletal: Normal range of motion.        General: Tenderness (BLE) and edema (trace bilateral ankle; L > R) present.     Left shoulder: She exhibits tenderness (acute).  Lymphadenopathy:       Head (right side): No preauricular, no posterior auricular and no occipital adenopathy present.       Head (left side): No preauricular, no posterior auricular and no occipital adenopathy present.    She has no cervical adenopathy.    She has no axillary adenopathy.  Right: No inguinal and no supraclavicular adenopathy present.       Left: No inguinal and no supraclavicular adenopathy present.  Neurological: She is alert and oriented to person, place, and time.  Skin: Skin is warm and dry. No rash noted. She is not diaphoretic. No erythema. No pallor.  Psychiatric: She has a normal mood and affect. Her behavior is normal. Judgment and thought content normal.  Nursing note and vitals reviewed.   Appointment on 01/05/2019  Component Date Value Ref Range Status  . WBC 01/05/2019 78.6* 4.0 - 10.5 K/uL Final   Comment: This critical result has verified and been called to [place] by Randalyn Rhea on 11 13 2020 at [phone], and has been read back.  This critical result has verified and been called to [place] by Randalyn Rhea on 11 13 2020 at [phone], and has been read back.    Marland Kitchen RBC 01/05/2019 4.55  3.87 - 5.11 MIL/uL Final  . Hemoglobin 01/05/2019 14.9  12.0 - 15.0 g/dL Final  . HCT 01/05/2019 44.9  36.0 - 46.0  % Final  . MCV 01/05/2019 98.7  80.0 - 100.0 fL Final  . MCH 01/05/2019 32.7  26.0 - 34.0 pg Final  . MCHC 01/05/2019 33.2  30.0 - 36.0 g/dL Final  . RDW 01/05/2019 13.2  11.5 - 15.5 % Final  . Platelets 01/05/2019 153  150 - 400 K/uL Final  . nRBC 01/05/2019 0.0  0.0 - 0.2 % Final   Performed at [facility], [address] [place], [place] [address]  . Neutrophils Relative % 01/05/2019 PENDING  % Incomplete  . Neutro Abs 01/05/2019 PENDING  1.7 - 7.7 K/uL Incomplete  . Band Neutrophils 01/05/2019 PENDING  % Incomplete  . Lymphocytes Relative 01/05/2019 PENDING  % Incomplete  . Lymphs Abs 01/05/2019 PENDING  0.7 - 4.0 K/uL Incomplete  . Monocytes Relative 01/05/2019 PENDING  % Incomplete  . Monocytes Absolute 01/05/2019 PENDING  0.1 - 1.0 K/uL Incomplete  . Eosinophils Relative 01/05/2019 PENDING  % Incomplete  . Eosinophils Absolute 01/05/2019 PENDING  0.0 - 0.5 K/uL Incomplete  . Basophils Relative 01/05/2019 PENDING  % Incomplete  . Basophils Absolute 01/05/2019 PENDING  0.0 - 0.1 K/uL Incomplete  . WBC Morphology 01/05/2019 PENDING   Incomplete  . RBC Morphology 01/05/2019 PENDING   Incomplete  . Smear Review 01/05/2019 PENDING   Incomplete  . Other 01/05/2019 PENDING  % Incomplete  . nRBC 01/05/2019 PENDING  0 /100 WBC Incomplete  . Metamyelocytes Relative 01/05/2019 PENDING  % Incomplete  . Myelocytes 01/05/2019 PENDING  % Incomplete  . Promyelocytes Relative 01/05/2019 PENDING  % Incomplete  . Blasts 01/05/2019 PENDING  % Incomplete  . Immature Granulocytes 01/05/2019 PENDING  % Incomplete  . Abs Immature Granulocytes 01/05/2019 PENDING  0.00 - 0.07 K/uL Incomplete    Assessment:  Katherine [name] is a 78 y.o. female  with chronic lymphocytic leukemia (CLL). WBC has ranged between 22,000 - 101,7000 since 05/2011.  She has received Rituxanx 2 four week cycles (06/27/2015 and 05/27/2017). Hepatitis B surface antigen and hepatitis B surface antibody were  negative on 07/04/2015.  She has a 35 pack year smoking history. She is in the low dose chest CT program. Low dose chest CTon 02/02/2018 revealed a new endobronchial lesionin the segmental bronchus to the medial segment of the right middle lobe. This may simply reflect an area of retained secretions, however, the possibility of an endobronchial neoplasm should be considered. Lung-RADS 4AS,  suspicious.  Low dose chest CT on 07/05/2018 revealed Lung-RADS 2, benign appearance or behavior. Prior right middle lobe endobronchial lesion/mucous plugging wasno longer visualized. New mucous plugging in the right lower lobe was noted  Symptomatically, she feels "ok".  She denies any fever or weight loss.  She notes "power surges".  Exam reveals no adenopathy or hepatosplenomegaly.  WBC is 78,600.  Plan: 1.   Labs today: CBC with diff, CMP, LDH, uric acid. 2. Chronic lymphocytic leukemia (CLL)             Clinically she appears to be doing well.              She denies any fevers or weight loss.   She has "power surges" with sweats (? post menopausal).             Exam reveals no adenopathy or hepatosplenomegaly. WBC is 78,600.    Check FISH studies. Discuss indications for treatment.  CLL doubling time is approximately 6 months.  Discuss likely need for initiation of treatment soon.   Discuss prior treatment with Rituxan.   Information on ibrutnib provided.             Continue close surveillance. 3. Health maintenance Patient in lung cancer screening program. Low-dose chest CT on 07/05/2018 was benign.               Continue low dose chest CT annually. 4.   RTC in 3 months for MD assessment, labs (CBC with diff, CML, LDH, uric acid).  I discussed the assessment and treatment plan with the patient.  The patient was provided an opportunity to ask questions and all were answered.  The patient agreed with the plan and demonstrated  an understanding of the instructions.  The patient was advised to call back if the symptoms worsen or if the condition fails to improve as anticipated.   Lequita Asal, MD, PhD    01/05/2019, 9:22 AM  I, Samul Dada, am acting as a scribe for Lequita Asal, MD.  I, [place] Mike Gip, MD, have reviewed the above documentation for accuracy and completeness, and I agree with the above.

## 2019-01-05 ENCOUNTER — Inpatient Hospital Stay: Payer: Medicare HMO | Attending: Hematology and Oncology | Admitting: Hematology and Oncology

## 2019-01-05 ENCOUNTER — Inpatient Hospital Stay: Payer: Medicare HMO

## 2019-01-05 ENCOUNTER — Telehealth: Payer: Self-pay | Admitting: *Deleted

## 2019-01-05 ENCOUNTER — Encounter: Payer: Self-pay | Admitting: Hematology and Oncology

## 2019-01-05 VITALS — BP 138/77 | HR 77 | Temp 97.2°F | Resp 18 | Ht 66.0 in | Wt 153.2 lb

## 2019-01-05 DIAGNOSIS — C9110 Chronic lymphocytic leukemia of B-cell type not having achieved remission: Secondary | ICD-10-CM | POA: Diagnosis not present

## 2019-01-05 DIAGNOSIS — M25512 Pain in left shoulder: Secondary | ICD-10-CM | POA: Diagnosis not present

## 2019-01-05 DIAGNOSIS — F17210 Nicotine dependence, cigarettes, uncomplicated: Secondary | ICD-10-CM | POA: Insufficient documentation

## 2019-01-05 DIAGNOSIS — Z90710 Acquired absence of both cervix and uterus: Secondary | ICD-10-CM | POA: Diagnosis not present

## 2019-01-05 DIAGNOSIS — R69 Illness, unspecified: Secondary | ICD-10-CM | POA: Diagnosis not present

## 2019-01-05 DIAGNOSIS — R911 Solitary pulmonary nodule: Secondary | ICD-10-CM | POA: Insufficient documentation

## 2019-01-05 LAB — CBC WITH DIFFERENTIAL/PLATELET
Abs Immature Granulocytes: 0.11 10*3/uL — ABNORMAL HIGH (ref 0.00–0.07)
Basophils Absolute: 0.1 10*3/uL (ref 0.0–0.1)
Basophils Relative: 0 %
Eosinophils Absolute: 0.3 10*3/uL (ref 0.0–0.5)
Eosinophils Relative: 0 %
HCT: 44.9 % (ref 36.0–46.0)
Hemoglobin: 14.9 g/dL (ref 12.0–15.0)
Immature Granulocytes: 0 %
Lymphocytes Relative: 90 %
Lymphs Abs: 70.5 10*3/uL — ABNORMAL HIGH (ref 0.7–4.0)
MCH: 32.7 pg (ref 26.0–34.0)
MCHC: 33.2 g/dL (ref 30.0–36.0)
MCV: 98.7 fL (ref 80.0–100.0)
Monocytes Absolute: 4 10*3/uL — ABNORMAL HIGH (ref 0.1–1.0)
Monocytes Relative: 5 %
Neutro Abs: 3.6 10*3/uL (ref 1.7–7.7)
Neutrophils Relative %: 5 %
Platelets: 153 10*3/uL (ref 150–400)
RBC: 4.55 MIL/uL (ref 3.87–5.11)
RDW: 13.2 % (ref 11.5–15.5)
WBC: 78.6 10*3/uL (ref 4.0–10.5)
nRBC: 0 % (ref 0.0–0.2)

## 2019-01-05 LAB — COMPREHENSIVE METABOLIC PANEL
ALT: 15 U/L (ref 0–44)
AST: 23 U/L (ref 15–41)
Albumin: 4.9 g/dL (ref 3.5–5.0)
Alkaline Phosphatase: 82 U/L (ref 38–126)
Anion gap: 10 (ref 5–15)
BUN: 9 mg/dL (ref 8–23)
CO2: 26 mmol/L (ref 22–32)
Calcium: 9.3 mg/dL (ref 8.9–10.3)
Chloride: 104 mmol/L (ref 98–111)
Creatinine, Ser: 0.67 mg/dL (ref 0.44–1.00)
GFR calc Af Amer: >60 mL/min (ref >60)
GFR calc non Af Amer: >60 mL/min (ref >60)
Glucose, Bld: 111 mg/dL — ABNORMAL HIGH (ref 70–99)
Potassium: 4 mmol/L (ref 3.5–5.1)
Sodium: 140 mmol/L (ref 135–145)
Total Bilirubin: 1.3 mg/dL — ABNORMAL HIGH (ref 0.3–1.2)
Total Protein: 6.9 g/dL (ref 6.5–8.1)

## 2019-01-05 LAB — LACTATE DEHYDROGENASE: LDH: 150 U/L (ref 98–192)

## 2019-01-05 LAB — URIC ACID: Uric Acid, Serum: 4.5 mg/dL (ref 2.5–7.1)

## 2019-01-05 NOTE — Progress Notes (Signed)
No new changes noted today 

## 2019-01-05 NOTE — Telephone Encounter (Signed)
Katherine [name] in the lab called to say that patient's WBC value for today was 78.6.  MD made aware.

## 2019-01-05 NOTE — Patient Instructions (Signed)
Ibrutinib capsules and tablets What is this medicine? IBRUTINIB (eye BROO ti nib) is a medicine that targets proteins in cancer cells and stops the cancer cells from growing. It is used to treat mantle cell lymphoma, chronic lymphocytic leukemia, small lymphocytic lymphoma, marginal zone lymphoma, Waldenstrom macroglobulinemia, and chronic graft-versus-host disease. This medicine may be used for other purposes; ask your health care provider or pharmacist if you have questions. COMMON BRAND NAME(S): Imbruvica What should I tell my health care provider before I take this medicine? They need to know if you have any of these conditions:  bleeding disorders  diabetes  heart disease  high blood pressure  high cholesterol  history of irregular heartbeat  infection  liver disease  recent surgery  smoke tobacco  take medicines that treat or prevent blood clots  an unusual or allergic reaction to ibrutinib, other medicines, foods, dyes, or preservatives  pregnant or trying to get pregnant  breast-feeding How should I use this medicine? Take this medicine by mouth with a glass of water. Follow the directions on the prescription label. Do not cut, crush or chew this medicine. Do not take with grapefruit juice or eat [place] oranges. Take your medicine at regular intervals. Do not take it more often than directed. Do not stop taking except on your doctor's advice. Talk to your pediatrician regarding the use of this medicine in children. Special care may be needed. Overdosage: If you think you have taken too much of this medicine contact a poison control center or emergency room at once. NOTE: This medicine is only for you. Do not share this medicine with others. What if I miss a dose? If you miss a dose, take it as soon as you can. If it is almost time for your next dose, take only that dose. Do not take double or extra doses. What may interact with this medicine? This medicine may  interact with the following medications:  antiviral medications for HIV or AIDS  aprepitant  boceprevir  calcium channel blockers like diltiazem and verapamil  certain antibiotics like clarithromycin, erythromycin, and troleandomycin  certain medicines for fungal infections like fluconazole, ketoconazole, itraconazole, posaconazole, and voriconazole  certain medicines for seizures like carbamazepine and phenytoin  cimetidine  ciprofloxacin  clotrimazole  conivaptan  crizotinib  cyclosporine  digoxin  dronedarone  enzalutamide  fluvoxamine  grapefruit juice or [place] oranges  idelalisib  imatinib  methotrexate  mitotane  nefazodone  rifampin  St. John's wort  tofisopam This list may not describe all possible interactions. Give your health care provider a list of all the medicines, herbs, non-prescription drugs, or dietary supplements you use. Also tell them if you smoke, drink alcohol, or use illegal drugs. Some items may interact with your medicine. What should I watch for while using this medicine? You may need blood work done while you are taking this medicine. This medicine may increase your risk to bruise or bleed. Call your doctor or health care professional if you notice any unusual bleeding. Call your doctor or health care professional for advice if you get a fever, chills or sore throat, or other symptoms of a cold or flu. Do not treat yourself. This drug decreases your body's ability to fight infections. Try to avoid being around people who are sick. If you are going to have surgery or any other procedures, tell your doctor you are taking this medicine. Tell your dentist and dental surgeon that you are taking this medicine. You should not have major dental   surgery while on this medicine. See your dentist to have a dental exam and fix any dental problems before starting this medicine. Talk to your doctor about your risk of cancer. You may be more at  risk for certain types of cancers if you take this medicine. Do not become pregnant while taking this medicine or for 1 month after stopping it. Women should inform their doctor if they wish to become pregnant or think they might be pregnant. Men should not father a child while taking this medicine and for 1 month after stopping it. There is a potential for serious side effects to an unborn child. Talk to your health care professional or pharmacist for more information. Do not breast-feed an infant while taking this medicine or for 1 week after stopping it. What side effects may I notice from receiving this medicine? Side effects that you should report to your doctor or health care professional as soon as possible:  allergic reactions like skin rash, itching or hives, swelling of the face, lips, or tongue  low blood counts - this medicine may decrease the number of white blood cells, red blood cells and platelets. You may be at increased risk for infections and bleeding  signs or symptoms of bleeding such as bloody or black, tarry stools; red or dark-brown urine; spitting up blood or brown material that looks like coffee grounds; red spots on the skin; unusual bruising or bleeding from the eye, gums, or nose; confusion; trouble speaking or understanding; severe headaches; weakness; or dizziness  signs and symptoms of a dangerous change in heartbeat or heart rhythm like chest pain; dizziness; fast or irregular heartbeat; palpitations; feeling faint or lightheaded, falls; breathing problems  signs and symptoms of infection like fever or chills; cough; sore throat; or pain when urinating  signs and symptoms of kidney injury like trouble passing urine or change in the amount of urine Side effects that usually do not require medical attention (report to your doctor or health care professional if they continue or are bothersome):  bone pain  diarrhea  mouth sores  muscle cramps  muscle  pain  nausea  tiredness This list may not describe all possible side effects. Call your doctor for medical advice about side effects. You may report side effects to FDA at 1-800-FDA-1088. Where should I keep my medicine? Keep out of the reach of children. Store between 20 and 25 degrees C (68 and 77 degrees F). Keep this medicine in the original container. Throw away any unused medicine after the expiration date. NOTE: This sheet is a summary. It may not cover all possible information. If you have questions about this medicine, talk to your doctor, pharmacist, or health care provider.  2020 Elsevier/Gold Standard (2018-06-15 10:56:02)  

## 2019-01-08 ENCOUNTER — Ambulatory Visit: Payer: Medicare HMO | Admitting: Family Medicine

## 2019-01-10 ENCOUNTER — Other Ambulatory Visit: Payer: Self-pay

## 2019-01-11 ENCOUNTER — Encounter: Payer: Self-pay | Admitting: Family Medicine

## 2019-01-11 ENCOUNTER — Ambulatory Visit (INDEPENDENT_AMBULATORY_CARE_PROVIDER_SITE_OTHER): Payer: Medicare HMO | Admitting: Family Medicine

## 2019-01-11 ENCOUNTER — Other Ambulatory Visit: Payer: Self-pay

## 2019-01-11 VITALS — BP 135/71 | HR 83 | Temp 98.4°F | Ht 66.1 in | Wt 153.2 lb

## 2019-01-11 DIAGNOSIS — C9110 Chronic lymphocytic leukemia of B-cell type not having achieved remission: Secondary | ICD-10-CM

## 2019-01-11 DIAGNOSIS — K219 Gastro-esophageal reflux disease without esophagitis: Secondary | ICD-10-CM | POA: Diagnosis not present

## 2019-01-11 DIAGNOSIS — I10 Essential (primary) hypertension: Secondary | ICD-10-CM

## 2019-01-11 DIAGNOSIS — J438 Other emphysema: Secondary | ICD-10-CM

## 2019-01-11 DIAGNOSIS — I700 Atherosclerosis of aorta: Secondary | ICD-10-CM | POA: Diagnosis not present

## 2019-01-11 DIAGNOSIS — F3342 Major depressive disorder, recurrent, in full remission: Secondary | ICD-10-CM

## 2019-01-11 DIAGNOSIS — J3089 Other allergic rhinitis: Secondary | ICD-10-CM

## 2019-01-11 DIAGNOSIS — Z0000 Encounter for general adult medical examination without abnormal findings: Secondary | ICD-10-CM

## 2019-01-11 DIAGNOSIS — R69 Illness, unspecified: Secondary | ICD-10-CM | POA: Diagnosis not present

## 2019-01-11 LAB — UA/M W/RFLX CULTURE, ROUTINE
Bilirubin, UA: NEGATIVE
Glucose, UA: NEGATIVE
Ketones, UA: NEGATIVE
Leukocytes,UA: NEGATIVE
Nitrite, UA: NEGATIVE
Protein,UA: NEGATIVE
RBC, UA: NEGATIVE
Specific Gravity, UA: 1.02 (ref 1.005–1.030)
Urobilinogen, Ur: 0.2 mg/dL (ref 0.2–1.0)
pH, UA: 7 (ref 5.0–7.5)

## 2019-01-11 MED ORDER — PAROXETINE HCL 30 MG PO TABS: 30.0000 mg | ORAL_TABLET | Freq: Every day | ORAL | 1 refills | Status: DC

## 2019-01-11 MED ORDER — ATORVASTATIN CALCIUM 10 MG PO TABS: 10.0000 mg | ORAL_TABLET | Freq: Every day | ORAL | 1 refills | Status: DC

## 2019-01-11 MED ORDER — BENAZEPRIL HCL 40 MG PO TABS: 40.0000 mg | ORAL_TABLET | Freq: Every day | ORAL | 1 refills | Status: DC

## 2019-01-11 MED ORDER — OMEPRAZOLE 20 MG PO CPDR: 20.0000 mg | DELAYED_RELEASE_CAPSULE | Freq: Every day | ORAL | 1 refills | Status: DC | PRN

## 2019-01-11 MED ORDER — LORAZEPAM 1 MG PO TABS: 0.5000 mg | ORAL_TABLET | Freq: Two times a day (BID) | ORAL | 5 refills | Status: DC | PRN

## 2019-01-11 NOTE — Progress Notes (Signed)
BP 135/71   Pulse 83   Temp 98.4 F (36.9 C)   Ht 5' 6.1" (1.679 m)   Wt 153 lb 4 oz (69.5 kg)   SpO2 98%   BMI 24.66 kg/m    Subjective:    Patient ID: Katherine [name], female    DOB: 07/30/1940, 78 y.o.   MRN: [id number]  HPI: Katherine [name] is a 78 y.o. female presenting on 01/11/2019 for comprehensive medical examination. Current medical complaints include:see below  Continues to follow closely with Hematology/Oncology for CLL, for which her WBCs have been significantly elevated of late and she is going through regimen changes. Fatigued but otherwise feeling close to baseline.   2 months of left shoulder pain and weakness, unable to reach behind her back. Only seems to hurt if moving the wrong way. Taking tylenol as needed for this. No known injury, redness, swelling.   HTN - home BPs running 130s/70s regularly, taking her medicine faithfully without issue. Denies CP, SOB, HAs.   HLD - on lipitor, tolerating well without myalgias. Trying to eat well though she doesn't always have a huge appetite. Stays as active as she can tolerate being.   GERD - prilosec helping well with her GERD sxs on prn basis. No breakthrough issues noted.   Anxiety - on paxil and ativan BID. Has been on this regimen for years with good benefit. Denies side effects.   She currently lives with: Menopausal Symptoms: no  Depression Screen done today and results listed below:  Depression screen [facility] 2/9 01/11/2019 07/11/2018 02/16/2018 01/09/2018 10/07/2017  Decreased Interest 1 0 0 0 0  Down, Depressed, Hopeless 1 0 1 0 0  PHQ - 2 Score 2 0 1 0 0  Altered sleeping 0 0 - 0 0  Tired, decreased energy 2 1 - 1 0  Change in appetite 0 0 - 0 0  Feeling bad or failure about yourself  0 0 - 0 0  Trouble concentrating 0 1 - 0 0  Moving slowly or fidgety/restless 0 1 - 0 0  Suicidal thoughts 0 0 - 0 0  PHQ-9 Score 4 3 - 1 0  Difficult doing work/chores Somewhat difficult Not difficult at all - - Not  difficult at all    The patient does not have a history of falls. I did complete a risk assessment for falls. A plan of care for falls was documented.   Past Medical History:  Past Medical History:  Diagnosis Date  . Allergy   . Anxiety   . Depression   . GERD (gastroesophageal reflux disease)   . Hemorrhoids   . Leukemia, lymphoid ([employer])    CLL  . Lobar pneumonia ([place])   . Osteopenia   . Personal history of chemotherapy     Surgical History:  Past Surgical History:  Procedure Laterality Date  . ABDOMINAL HYSTERECTOMY    . APPENDECTOMY    . BREAST BIOPSY Left    bx x 3-neg  . COLON SURGERY     sigmoid resection  . COLONOSCOPY     2007, 2012  . COLONOSCOPY WITH PROPOFOL N/A 09/17/2015   Procedure: COLONOSCOPY WITH PROPOFOL;  Surgeon: Robert Bellow, MD;  Location: [facility] ENDOSCOPY;  Service: Endoscopy;  Laterality: N/A;  . OOPHORECTOMY    . SPINE SURGERY     L4-5    Medications:  Current Outpatient Medications on File Prior to Visit  Medication Sig  . aspirin EC 81 MG tablet Take 81 mg  by mouth every other day.   . diphenhydrAMINE (BENADRYL) 25 MG tablet Take 25 mg by mouth at bedtime as needed.  . Multiple Vitamins-Minerals (MULTIVITAMIN WITH MINERALS) tablet Take 1 tablet by mouth daily.  . Triamcinolone Acetonide (NASACORT AQ NA) Place 2 Doses into the nose daily as needed.    No current facility-administered medications on file prior to visit.     Allergies:  Allergies  Allergen Reactions  . Augmentin [Amoxicillin-Pot Clavulanate] Swelling    tongue  . Biaxin [Clarithromycin] Swelling and Rash    tongue    Social History:  Social History   Socioeconomic History  . Marital status: Married    Spouse name: Not on file  . Number of children: Not on file  . Years of education: 27  . Highest education level: 12th grade  Occupational History  . Not on file  Social Needs  . Financial resource strain: Not hard at all  . Food insecurity    Worry: Never  true    Inability: Never true  . Transportation needs    Medical: No    Non-medical: No  Tobacco Use  . Smoking status: Current Every Day Smoker    Packs/day: 0.25    Years: 35.00    Pack years: 8.75    Types: Cigarettes  . Smokeless tobacco: Never Used  Substance and Sexual Activity  . Alcohol use: No    Alcohol/week: 0.0 standard drinks  . Drug use: No  . Sexual activity: Not on file  Lifestyle  . Physical activity    Days per week: 0 days    Minutes per session: 0 min  . Stress: To some extent  Relationships  . Social connections    Talks on phone: More than three times a week    Gets together: Once a week    Attends religious service: More than 4 times per year    Active member of club or organization: No    Attends meetings of clubs or organizations: Never    Relationship status: Married  . Intimate partner violence    Fear of current or ex partner: No    Emotionally abused: No    Physically abused: No    Forced sexual activity: No  Other Topics Concern  . Not on file  Social History Narrative  . Not on file   Social History   Tobacco Use  Smoking Status Current Every Day Smoker  . Packs/day: 0.25  . Years: 35.00  . Pack years: 8.75  . Types: Cigarettes  Smokeless Tobacco Never Used   Social History   Substance and Sexual Activity  Alcohol Use No  . Alcohol/week: 0.0 standard drinks    Family History:  Family History  Problem Relation Age of Onset  . Osteoporosis Mother   . Hypertension Father   . Heart attack Father   . Stroke Maternal Grandfather   . Breast cancer Sister 39    Past medical history, surgical history, medications, allergies, family history and social history reviewed with patient today and changes made to appropriate areas of the chart.   Review of Systems - General ROS: negative Psychological ROS: negative Ophthalmic ROS: negative ENT ROS: negative Allergy and Immunology ROS: negative Hematological and Lymphatic ROS:  negative Endocrine ROS: negative Breast ROS: negative for breast lumps Respiratory ROS: no cough, shortness of breath, or wheezing Cardiovascular ROS: no chest pain or dyspnea on exertion Gastrointestinal ROS: no abdominal pain, change in bowel habits, or black or bloody stools  Genito-Urinary ROS: no dysuria, trouble voiding, or hematuria Musculoskeletal ROS: negative Neurological ROS: no TIA or stroke symptoms Dermatological ROS: negative All other ROS negative except what is listed above and in the HPI.      Objective:    BP 135/71   Pulse 83   Temp 98.4 F (36.9 C)   Ht 5' 6.1" (1.679 m)   Wt 153 lb 4 oz (69.5 kg)   SpO2 98%   BMI 24.66 kg/m   Wt Readings from Last 3 Encounters:  01/11/19 153 lb 4 oz (69.5 kg)  01/05/19 153 lb 3.5 oz (69.5 kg)  10/04/18 152 lb 12.5 oz (69.3 kg)    Physical Exam Vitals signs and nursing note reviewed. Exam conducted with a chaperone present.  Constitutional:      General: She is not in acute distress.    Appearance: She is well-developed.  HENT:     Head: Atraumatic.     Right Ear: External ear normal.     Left Ear: External ear normal.     Nose: Nose normal.     Mouth/Throat:     Pharynx: No oropharyngeal exudate.  Eyes:     General: No scleral icterus.    Conjunctiva/sclera: Conjunctivae normal.     Pupils: Pupils are equal, round, and reactive to light.  Neck:     Musculoskeletal: Normal range of motion and neck supple.     Thyroid: No thyromegaly.  Cardiovascular:     Rate and Rhythm: Normal rate and regular rhythm.     Heart sounds: Normal heart sounds.  Pulmonary:     Effort: Pulmonary effort is normal. No respiratory distress.     Breath sounds: Normal breath sounds.  Chest:     Breasts:        Right: No mass, skin change or tenderness.        Left: No mass, skin change or tenderness.  Abdominal:     General: Bowel sounds are normal.     Palpations: Abdomen is soft. There is no mass.     Tenderness: There is no  abdominal tenderness.  Genitourinary:    Comments: GU exam declined Musculoskeletal:        General: No tenderness.     Comments: Decreased ROM and strength left arm when reaching behind back  Lymphadenopathy:     Cervical: No cervical adenopathy.     Upper Body:     Right upper body: No axillary adenopathy.     Left upper body: No axillary adenopathy.  Skin:    General: Skin is warm and dry.     Findings: No rash.  Neurological:     Mental Status: She is alert and oriented to person, place, and time.     Cranial Nerves: No cranial nerve deficit.  Psychiatric:        Behavior: Behavior normal.     Results for orders placed or performed in visit on 01/11/19  CBC with Differential/Platelet out  Result Value Ref Range   WBC 75.6 (HH) 3.4 - 10.8 x10E3/uL   RBC 4.35 3.77 - 5.28 x10E6/uL   Hemoglobin 14.5 11.1 - 15.9 g/dL   Hematocrit 42.6 34.0 - 46.6 %   MCV 98 (H) 79 - 97 fL   MCH 33.3 (H) 26.6 - 33.0 pg   MCHC 34.0 31.5 - 35.7 g/dL   RDW 12.7 11.7 - 15.4 %   Platelets 135 (L) 150 - 450 x10E3/uL   Neutrophils 5 Not Estab. %  Lymphs 90 Not Estab. %   Monocytes 5 Not Estab. %   Eos 0 Not Estab. %   Basos 0 Not Estab. %   Neutrophils Absolute 3.5 1.4 - 7.0 x10E3/uL   Lymphocytes Absolute 67.8 (H) 0.7 - 3.1 x10E3/uL   Monocytes Absolute 3.7 (H) 0.1 - 0.9 x10E3/uL   EOS (ABSOLUTE) 0.3 0.0 - 0.4 x10E3/uL   Basophils Absolute 0.1 0.0 - 0.2 x10E3/uL   Immature Granulocytes 0 Not Estab. %   Immature Grans (Abs) 0.0 0.0 - 0.1 x10E3/uL   Hematology Comments: Note:   Comprehensive metabolic panel  Result Value Ref Range   Glucose 86 65 - 99 mg/dL   BUN 10 8 - 27 mg/dL   Creatinine, Ser 0.77 0.57 - 1.00 mg/dL   GFR calc non Af Amer 74 >59 mL/min/1.73   GFR calc Af Amer 86 >59 mL/min/1.73   BUN/Creatinine Ratio 13 12 - 28   Sodium 146 (H) 134 - 144 mmol/L   Potassium 4.2 3.5 - 5.2 mmol/L   Chloride 107 (H) 96 - 106 mmol/L   CO2 23 20 - 29 mmol/L   Calcium 9.5 8.7 - 10.3  mg/dL   Total Protein 6.2 6.0 - 8.5 g/dL   Albumin 4.7 3.7 - 4.7 g/dL   Globulin, Total 1.5 1.5 - 4.5 g/dL   Albumin/Globulin Ratio 3.1 (H) 1.2 - 2.2   Bilirubin Total 0.6 0.0 - 1.2 mg/dL   Alkaline Phosphatase 104 39 - 117 IU/L   AST 20 0 - 40 IU/L   ALT 11 0 - 32 IU/L  Lipid Panel w/o Chol/HDL Ratio out  Result Value Ref Range   Cholesterol, Total 129 100 - 199 mg/dL   Triglycerides 137 0 - 149 mg/dL   HDL 41 >39 mg/dL   VLDL Cholesterol Cal 24 5 - 40 mg/dL   LDL Chol Calc (NIH) 64 0 - 99 mg/dL  UA/M w/rflx Culture, Routine   Specimen: Urine   URINE  Result Value Ref Range   Specific Gravity, UA 1.020 1.005 - 1.030   pH, UA 7.0 5.0 - 7.5   Color, UA Yellow Yellow   Appearance Ur Clear Clear   Leukocytes,UA Negative Negative   Protein,UA Negative Negative/Trace   Glucose, UA Negative Negative   Ketones, UA Negative Negative   RBC, UA Negative Negative   Bilirubin, UA Negative Negative   Urobilinogen, Ur 0.2 0.2 - 1.0 mg/dL   Nitrite, UA Negative Negative      Assessment & Plan:   Problem List Items Addressed This Visit      Cardiovascular and Mediastinum   Essential hypertension - Primary    BPs stable and WNL, continue current regimen      Relevant Medications   benazepril (LOTENSIN) 40 MG tablet   atorvastatin (LIPITOR) 10 MG tablet   Other Relevant Orders   CBC with Differential/Platelet out (Completed)   Comprehensive metabolic panel (Completed)   UA/M w/rflx Culture, Routine (Completed)   Atherosclerosis of aorta (HCC)    Recheck lipids, continue lipitor and good lifestyle modifications      Relevant Medications   benazepril (LOTENSIN) 40 MG tablet   atorvastatin (LIPITOR) 10 MG tablet   Other Relevant Orders   Lipid Panel w/o Chol/HDL Ratio out (Completed)     Respiratory   Emphysema of lung (HCC)    Breathing stable without recent exacerbations. Continue current regimen      Allergic rhinitis    Stable and under good control, continue current  regimen  Digestive   GERD (gastroesophageal reflux disease)    Stable and under good control, continue prn prilosec regimen      Relevant Medications   omeprazole (PRILOSEC) 20 MG capsule     Other   Depression    Stable and under good control, continue current regimen      Relevant Medications   PARoxetine (PAXIL) 30 MG tablet   LORazepam (ATIVAN) 1 MG tablet   CLL (chronic lymphocytic leukemia) (HCC)    Followed closely by Hematology/Oncology, continue per their recommendations      Relevant Medications   LORazepam (ATIVAN) 1 MG tablet    Other Visit Diagnoses    Annual physical exam           Follow up plan: Return in about 6 months (around 07/11/2019) for 6 month f/u.   LABORATORY TESTING:  - Pap smear: not applicable  IMMUNIZATIONS:   - Tdap: Tetanus vaccination status reviewed: last tetanus booster within 10 years. - Influenza: Up to date  SCREENING: -Mammogram: Up to date  - Colonoscopy: Up to date  - Bone Density: Up to date   PATIENT COUNSELING:   Advised to take 1 mg of folate supplement per day if capable of pregnancy.   Sexuality: Discussed sexually transmitted diseases, partner selection, use of condoms, avoidance of unintended pregnancy  and contraceptive alternatives.   Advised to avoid cigarette smoking.  I discussed with the patient that most people either abstain from alcohol or drink within safe limits (<=14/week and <=4 drinks/occasion for males, <=7/weeks and <= 3 drinks/occasion for females) and that the risk for alcohol disorders and other health effects rises proportionally with the number of drinks per week and how often a drinker exceeds daily limits.  Discussed cessation/primary prevention of drug use and availability of treatment for abuse.   Diet: Encouraged to adjust caloric intake to maintain  or achieve ideal body weight, to reduce intake of dietary saturated fat and total fat, to limit sodium intake by avoiding high  sodium foods and not adding table salt, and to maintain adequate dietary potassium and calcium preferably from fresh fruits, vegetables, and low-fat dairy products.    stressed the importance of regular exercise  Injury prevention: Discussed safety belts, safety helmets, smoke detector, smoking near bedding or upholstery.   Dental health: Discussed importance of regular tooth brushing, flossing, and dental visits.    NEXT PREVENTATIVE PHYSICAL DUE IN 1 YEAR. Return in about 6 months (around 07/11/2019) for 6 month f/u.

## 2019-01-12 ENCOUNTER — Telehealth: Payer: Self-pay | Admitting: Nurse Practitioner

## 2019-01-12 LAB — CBC WITH DIFFERENTIAL/PLATELET
Basophils Absolute: 0.1 10*3/uL (ref 0.0–0.2)
Basos: 0 %
EOS (ABSOLUTE): 0.3 10*3/uL (ref 0.0–0.4)
Eos: 0 %
Hematocrit: 42.6 % (ref 34.0–46.6)
Hemoglobin: 14.5 g/dL (ref 11.1–15.9)
Immature Grans (Abs): 0 10*3/uL (ref 0.0–0.1)
Immature Granulocytes: 0 %
Lymphocytes Absolute: 67.8 10*3/uL — ABNORMAL HIGH (ref 0.7–3.1)
Lymphs: 90 %
MCH: 33.3 pg — ABNORMAL HIGH (ref 26.6–33.0)
MCHC: 34 g/dL (ref 31.5–35.7)
MCV: 98 fL — ABNORMAL HIGH (ref 79–97)
Monocytes Absolute: 3.7 10*3/uL — ABNORMAL HIGH (ref 0.1–0.9)
Monocytes: 5 %
Neutrophils Absolute: 3.5 10*3/uL (ref 1.4–7.0)
Neutrophils: 5 %
Platelets: 135 10*3/uL — ABNORMAL LOW (ref 150–450)
RBC: 4.35 x10E6/uL (ref 3.77–5.28)
RDW: 12.7 % (ref 11.7–15.4)
WBC: 75.6 10*3/uL (ref 3.4–10.8)

## 2019-01-12 LAB — COMPREHENSIVE METABOLIC PANEL
ALT: 11 IU/L (ref 0–32)
AST: 20 IU/L (ref 0–40)
Albumin/Globulin Ratio: 3.1 — ABNORMAL HIGH (ref 1.2–2.2)
Albumin: 4.7 g/dL (ref 3.7–4.7)
Alkaline Phosphatase: 104 IU/L (ref 39–117)
BUN/Creatinine Ratio: 13 (ref 12–28)
BUN: 10 mg/dL (ref 8–27)
Bilirubin Total: 0.6 mg/dL (ref 0.0–1.2)
CO2: 23 mmol/L (ref 20–29)
Calcium: 9.5 mg/dL (ref 8.7–10.3)
Chloride: 107 mmol/L — ABNORMAL HIGH (ref 96–106)
Creatinine, Ser: 0.77 mg/dL (ref 0.57–1.00)
GFR calc Af Amer: 86 mL/min/{1.73_m2} (ref >59)
GFR calc non Af Amer: 74 mL/min/{1.73_m2} (ref >59)
Globulin, Total: 1.5 g/dL (ref 1.5–4.5)
Glucose: 86 mg/dL (ref 65–99)
Potassium: 4.2 mmol/L (ref 3.5–5.2)
Sodium: 146 mmol/L — ABNORMAL HIGH (ref 134–144)
Total Protein: 6.2 g/dL (ref 6.0–8.5)

## 2019-01-12 LAB — LIPID PANEL W/O CHOL/HDL RATIO
Cholesterol, Total: 129 mg/dL (ref 100–199)
HDL: 41 mg/dL (ref >39)
LDL Chol Calc (NIH): 64 mg/dL (ref 0–99)
Triglycerides: 137 mg/dL (ref 0–149)
VLDL Cholesterol Cal: 24 mg/dL (ref 5–40)

## 2019-01-12 NOTE — Telephone Encounter (Signed)
Yes, thank you. Her Hematologist is following this closely

## 2019-01-12 NOTE — Telephone Encounter (Signed)
Call team called at 0400 on 01/12/2019 to alert to critical lab value WBC 75.6 in patient with CLL.  Last visit with [place] 01/05/2019.  On review of note no changes.  Patient has WBC that has ranged between 22,000 - 101,700 since April 2013.  Will alert provider who obtained labs and continue collaboration with [place].

## 2019-01-15 ENCOUNTER — Ambulatory Visit
Admission: RE | Admit: 2019-01-15 | Discharge: 2019-01-15 | Disposition: A | Discharge status: Home / Self Care | Payer: Medicare HMO | Source: Ambulatory Visit | Attending: Family Medicine | Admitting: Family Medicine

## 2019-01-15 ENCOUNTER — Encounter: Payer: Self-pay | Admitting: Family Medicine

## 2019-01-15 ENCOUNTER — Other Ambulatory Visit: Payer: Self-pay

## 2019-01-15 DIAGNOSIS — Z1231 Encounter for screening mammogram for malignant neoplasm of breast: Secondary | ICD-10-CM | POA: Diagnosis not present

## 2019-01-16 LAB — FISH HES LEUKEMIA, 4Q12 REA

## 2019-01-17 DIAGNOSIS — K219 Gastro-esophageal reflux disease without esophagitis: Secondary | ICD-10-CM | POA: Insufficient documentation

## 2019-01-17 NOTE — Assessment & Plan Note (Signed)
BPs stable and WNL, continue current regimen 

## 2019-01-17 NOTE — Assessment & Plan Note (Signed)
Stable and under good control, continue prn prilosec regimen

## 2019-01-17 NOTE — Assessment & Plan Note (Signed)
Recheck lipids, continue lipitor and good lifestyle modifications

## 2019-01-17 NOTE — Assessment & Plan Note (Signed)
Breathing stable without recent exacerbations. Continue current regimen

## 2019-01-21 NOTE — Assessment & Plan Note (Signed)
Stable and under good control, continue current regimen 

## 2019-01-21 NOTE — Assessment & Plan Note (Signed)
Followed closely by Hematology/Oncology, continue per their recommendations

## 2019-02-13 ENCOUNTER — Ambulatory Visit: Payer: Medicare HMO | Admitting: Hematology and Oncology

## 2019-02-13 ENCOUNTER — Other Ambulatory Visit: Payer: Medicare HMO

## 2019-02-19 ENCOUNTER — Ambulatory Visit: Payer: Medicare HMO

## 2019-02-22 DIAGNOSIS — H2513 Age-related nuclear cataract, bilateral: Secondary | ICD-10-CM | POA: Diagnosis not present

## 2019-03-20 ENCOUNTER — Telehealth: Payer: Self-pay | Admitting: Family Medicine

## 2019-03-20 NOTE — Telephone Encounter (Signed)
Pt states she has bronchitis and laryngitis and would like to have an antibiotic called into the pharmacy please advise

## 2019-03-21 ENCOUNTER — Ambulatory Visit (INDEPENDENT_AMBULATORY_CARE_PROVIDER_SITE_OTHER): Payer: Medicare HMO | Admitting: Family Medicine

## 2019-03-21 ENCOUNTER — Encounter: Payer: Self-pay | Admitting: Family Medicine

## 2019-03-21 ENCOUNTER — Other Ambulatory Visit: Payer: Medicare HMO

## 2019-03-21 ENCOUNTER — Ambulatory Visit: Payer: Medicare HMO | Attending: Internal Medicine

## 2019-03-21 VITALS — BP 131/92 | HR 85 | Temp 97.8°F

## 2019-03-21 DIAGNOSIS — J040 Acute laryngitis: Secondary | ICD-10-CM | POA: Diagnosis not present

## 2019-03-21 DIAGNOSIS — Z20822 Contact with and (suspected) exposure to covid-19: Secondary | ICD-10-CM | POA: Diagnosis not present

## 2019-03-21 MED ORDER — PREDNISONE 10 MG PO TABS: ORAL_TABLET | ORAL | 0 refills | Status: DC

## 2019-03-21 NOTE — Patient Instructions (Signed)
To schedule a COVID test, please  text "COVID" to [phone], OR you can log on to [email or web address] to easily make an on-line appointment. If you do not have access to a smart phone or PC, you can call [phone] to get assistance.  

## 2019-03-21 NOTE — Progress Notes (Signed)
BP (!) 131/92   Pulse 85   Temp 97.8 F (36.6 C)    Subjective:    Patient ID: Katherine [name], female    DOB: 16-Oct-1940, 79 y.o.   MRN: [id number]  HPI: Katherine [name] is a 79 y.o. female  Chief Complaint  Patient presents with  . URI    post nasal drip, cough and loss of voice no fever X a couple days   UPPER RESPIRATORY TRACT INFECTION Duration: 2 weeks nose, yesterday- laryngitis Worst symptom: runny nose x 2 weeks, laryngitis Fever: no Cough: yes Shortness of breath: no Wheezing: no Chest pain: no Chest tightness: no Chest congestion: yes Nasal congestion: yes Runny nose: yes Post nasal drip: yes Sneezing: yes Sore throat: no Swollen glands: no Sinus pressure: no Headache: no Face pain: no Toothache: no Ear pain: no  Ear pressure: no  Eyes red/itching:no Eye drainage/crusting: no  Vomiting: no Rash: no Fatigue: yes Sick contacts: no Strep contacts: no  Context: worse Recurrent sinusitis: no Relief with OTC cold/cough medications: no  Treatments attempted: anti-histamine, cough syrup   Relevant past medical, surgical, family and social history reviewed and updated as indicated. Interim medical history since our last visit reviewed. Allergies and medications reviewed and updated.  Review of Systems  Constitutional: Positive for fatigue. Negative for activity change, appetite change, chills, diaphoresis, fever and unexpected weight change.  HENT: Positive for congestion, postnasal drip and rhinorrhea. Negative for dental problem, drooling, ear discharge, ear pain, facial swelling, hearing loss, mouth sores, nosebleeds, sinus pressure, sinus pain, sneezing, sore throat, tinnitus, trouble swallowing and voice change.   Respiratory: Positive for cough. Negative for apnea, choking, chest tightness, shortness of breath, wheezing and stridor.   Cardiovascular: Negative.   Psychiatric/Behavioral: Negative.     Per HPI unless specifically indicated  above     Objective:    BP (!) 131/92   Pulse 85   Temp 97.8 F (36.6 C)   Wt Readings from Last 3 Encounters:  01/11/19 153 lb 4 oz (69.5 kg)  01/05/19 153 lb 3.5 oz (69.5 kg)  10/04/18 152 lb 12.5 oz (69.3 kg)    Physical Exam Vitals and nursing note reviewed.  Pulmonary:     Effort: Pulmonary effort is normal. No respiratory distress.     Comments: Speaking in full sentences, very hoarse Neurological:     Mental Status: She is alert.  Psychiatric:        Mood and Affect: Mood normal.        Behavior: Behavior normal.        Thought Content: Thought content normal.        Judgment: Judgment normal.     Results for orders placed or performed in visit on 01/11/19  CBC with Differential/Platelet out  Result Value Ref Range   WBC 75.6 (HH) 3.4 - 10.8 x10E3/uL   RBC 4.35 3.77 - 5.28 x10E6/uL   Hemoglobin 14.5 11.1 - 15.9 g/dL   Hematocrit 42.6 34.0 - 46.6 %   MCV 98 (H) 79 - 97 fL   MCH 33.3 (H) 26.6 - 33.0 pg   MCHC 34.0 31.5 - 35.7 g/dL   RDW 12.7 11.7 - 15.4 %   Platelets 135 (L) 150 - 450 x10E3/uL   Neutrophils 5 Not Estab. %   Lymphs 90 Not Estab. %   Monocytes 5 Not Estab. %   Eos 0 Not Estab. %   Basos 0 Not Estab. %   Neutrophils Absolute 3.5 1.4 -  7.0 x10E3/uL   Lymphocytes Absolute 67.8 (H) 0.7 - 3.1 x10E3/uL   Monocytes Absolute 3.7 (H) 0.1 - 0.9 x10E3/uL   EOS (ABSOLUTE) 0.3 0.0 - 0.4 x10E3/uL   Basophils Absolute 0.1 0.0 - 0.2 x10E3/uL   Immature Granulocytes 0 Not Estab. %   Immature Grans (Abs) 0.0 0.0 - 0.1 x10E3/uL   Hematology Comments: Note:   Comprehensive metabolic panel  Result Value Ref Range   Glucose 86 65 - 99 mg/dL   BUN 10 8 - 27 mg/dL   Creatinine, Ser 0.77 0.57 - 1.00 mg/dL   GFR calc non Af Amer 74 >59 mL/min/1.73   GFR calc Af Amer 86 >59 mL/min/1.73   BUN/Creatinine Ratio 13 12 - 28   Sodium 146 (H) 134 - 144 mmol/L   Potassium 4.2 3.5 - 5.2 mmol/L   Chloride 107 (H) 96 - 106 mmol/L   CO2 23 20 - 29 mmol/L   Calcium 9.5  8.7 - 10.3 mg/dL   Total Protein 6.2 6.0 - 8.5 g/dL   Albumin 4.7 3.7 - 4.7 g/dL   Globulin, Total 1.5 1.5 - 4.5 g/dL   Albumin/Globulin Ratio 3.1 (H) 1.2 - 2.2   Bilirubin Total 0.6 0.0 - 1.2 mg/dL   Alkaline Phosphatase 104 39 - 117 IU/L   AST 20 0 - 40 IU/L   ALT 11 0 - 32 IU/L  Lipid Panel w/o Chol/HDL Ratio out  Result Value Ref Range   Cholesterol, Total 129 100 - 199 mg/dL   Triglycerides 137 0 - 149 mg/dL   HDL 41 >39 mg/dL   VLDL Cholesterol Cal 24 5 - 40 mg/dL   LDL Chol Calc (NIH) 64 0 - 99 mg/dL  UA/M w/rflx Culture, Routine   Specimen: Urine   URINE  Result Value Ref Range   Specific Gravity, UA 1.020 1.005 - 1.030   pH, UA 7.0 5.0 - 7.5   Color, UA Yellow Yellow   Appearance Ur Clear Clear   Leukocytes,UA Negative Negative   Protein,UA Negative Negative/Trace   Glucose, UA Negative Negative   Ketones, UA Negative Negative   RBC, UA Negative Negative   Bilirubin, UA Negative Negative   Urobilinogen, Ur 0.2 0.2 - 1.0 mg/dL   Nitrite, UA Negative Negative      Assessment & Plan:   Problem List Items Addressed This Visit    None    Visit Diagnoses    Laryngitis    -  Primary   Given overlap, will test for COVID. Will start prednisone taper. Rest. Fluids. Call if not getting better or getting worse. Continue to monitor.    Relevant Orders   Novel Coronavirus, NAA ([facility])       Follow up plan: Return if symptoms worsen or fail to improve.   . This visit was completed via telephone due to the restrictions of the COVID-19 pandemic. All issues as above were discussed and addressed but no physical exam was performed. If it was felt that the patient should be evaluated in the office, they were directed there. The patient verbally consented to this visit. Patient was unable to complete an audio/visual visit due to Lack of equipment. Due to the catastrophic nature of the COVID-19 pandemic, this visit was done through audio contact only. . Location of the  patient: home . Location of the provider: home . Those involved with this call:  . Provider: Park Liter, DO . CMA: Tiffany Reel, CMA . Front Desk/Registration: Don Perking  . Time spent  on call: 15 minutes on the phone discussing health concerns. 23 minutes total spent in review of patient's record and preparation of their chart.

## 2019-03-22 LAB — NOVEL CORONAVIRUS, NAA: SARS-CoV-2, NAA: NOT DETECTED

## 2019-03-23 ENCOUNTER — Telehealth: Payer: Self-pay

## 2019-03-23 ENCOUNTER — Telehealth: Payer: Self-pay | Admitting: Family Medicine

## 2019-03-23 NOTE — Telephone Encounter (Signed)
Patient called in requesting [place] lab results  - DOB/Address verified - Negative results given, no further questions.

## 2019-03-23 NOTE — Telephone Encounter (Signed)
Please let her know her COVID test was negative. Thanks!

## 2019-03-23 NOTE — Telephone Encounter (Signed)
Patient notified

## 2019-04-05 NOTE — Progress Notes (Signed)
[facility]  [address] [place], [place] [address] Phone: [phone]  Fax: [phone]   Clinic Day:  04/09/2019  Referring physician: [name], MD  Chief Complaint: Katherine [name] is a 79 y.o. female with chronic lymphocytic leukemia (CLL) who is seen for a 3 month assessment.  HPI: The patient was last seen in the medical oncology clinic on 01/05/2019. At that time, she felt "ok".  She denied any fever or weight loss.  She noted "power surges".  Exam revealed no adenopathy or hepatosplenomegaly.  Hematocrit 44.9, hemoglobin 14.9, platelets 153,000, WBC 78,600 (ANC 3,600; ALC 70,500), monocyte count 4,000. Total bilirubin was 1.3. LDH was 150 and uric acid 4.5.   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  CCND1, ATM, chromosome 12, and TP53 were normal.  The presence of 3 IGH signals in the absence of CCND1/IGH fusion suggests a possible rearrangement with another candidate gene, such as BCL2.  IGH/BCL2 FISH analysis may be considered.  Bilateral mammogram on 01/15/2019 showed no evidence of malignancy.   Patient was seen by Dr. Park Liter on 03/21/2019 for a URI. She noted post nasal drip, cough and loss of voice. She had no fever. She noted congestion, sneezing and fatigue. She denied any shortness of breath or chest tightness. She was started on prednisone taper.  COVID-19 test was negative.   During the interim, she has been doing "ok". She denies any B symptoms. She has occasional headaches. She notes allergy issue with associated rhinorrhea. She denies any bone or joint pain. Her mood is better.    Past Medical History:  Diagnosis Date  . Allergy   . Anxiety   . Depression   . GERD (gastroesophageal reflux disease)   . Hemorrhoids   . Leukemia, lymphoid ([place])    CLL  . Lobar pneumonia ([place])   . Osteopenia   . Personal history of chemotherapy      Past Surgical History:  Procedure Laterality Date  . ABDOMINAL HYSTERECTOMY    . APPENDECTOMY    . BREAST BIOPSY Left    bx x 3-neg  . COLON SURGERY     sigmoid resection  . COLONOSCOPY     2007, 2012  . COLONOSCOPY WITH PROPOFOL N/A 09/17/2015   Procedure: COLONOSCOPY WITH PROPOFOL;  Surgeon: Robert Bellow, MD;  Location: [facility] ENDOSCOPY;  Service: Endoscopy;  Laterality: N/A;  . OOPHORECTOMY    . SPINE SURGERY     L4-5    Family History  Problem Relation Age of Onset  . Osteoporosis Mother   . Hypertension Father   . Heart attack Father   . Stroke Maternal Grandfather   . Breast cancer Sister 37    Social History:  reports that she has been smoking cigarettes. She has a 8.75 pack-year smoking history. She has never used smokeless tobacco. She reports that she does not drink alcohol or use drugs. currently smokes 5 cigarettes daily, but is working toward quitting. She previously smoked a pack a day for a "longtime".She lives in [place], [place]. The patient is alone today.  Allergies:  Allergies  Allergen Reactions  . Augmentin [Amoxicillin-Pot Clavulanate] Swelling    tongue  . Biaxin [Clarithromycin] Swelling and Rash    tongue    Current Medications: Current Outpatient Medications  Medication Sig Dispense Refill  . aspirin EC 81 MG tablet Take 81 mg by mouth every other day.     Marland Kitchen  atorvastatin (LIPITOR) 10 MG tablet Take 1 tablet (10 mg total) by mouth daily. 90 tablet 1  . benazepril (LOTENSIN) 40 MG tablet Take 1 tablet (40 mg total) by mouth daily. 90 tablet 1  . LORazepam (ATIVAN) 1 MG tablet Take 0.5 tablets (0.5 mg total) by mouth 2 (two) times daily as needed for anxiety. 30 tablet 5  . Multiple Vitamins-Minerals (MULTIVITAMIN WITH MINERALS) tablet Take 1 tablet by mouth daily.    Marland Kitchen omeprazole (PRILOSEC) 20 MG capsule Take 1 capsule (20 mg total) by mouth daily as needed. 90 capsule 1  . PARoxetine (PAXIL) 30 MG tablet Take 1 tablet (30 mg total) by  mouth daily. 90 tablet 1  . predniSONE (DELTASONE) 10 MG tablet 6 tabs today and tomorrow, 5 tabs the next 2 days, decrease by 1 every other day until gone 42 tablet 0  . Triamcinolone Acetonide (NASACORT AQ NA) Place 2 Doses into the nose daily as needed.     . diphenhydrAMINE (BENADRYL) 25 MG tablet Take 25 mg by mouth at bedtime as needed.     No current facility-administered medications for this visit.    Review of Systems  Constitutional: Positive for weight loss (3 pounds). Negative for chills, diaphoresis, fever and malaise/fatigue.       Doing "ok".  HENT: Negative.  Negative for congestion, ear pain, hearing loss, nosebleeds, sinus pain and sore throat.   Eyes: Negative.  Negative for blurred vision, double vision, photophobia and pain.  Respiratory: Negative.  Negative for cough, hemoptysis, sputum production and shortness of breath.   Cardiovascular: Negative.  Negative for chest pain, palpitations, orthopnea, leg swelling and PND.  Gastrointestinal: Negative.  Negative for abdominal pain, blood in stool, constipation, diarrhea, heartburn, melena, nausea and vomiting.  Genitourinary: Negative.  Negative for dysuria, frequency, hematuria and urgency.  Musculoskeletal: Negative for back pain, joint pain (arthritis), myalgias and neck pain.  Skin: Negative.  Negative for itching and rash.  Neurological: Positive for headaches (occasionally). Negative for dizziness, tingling, speech change, focal weakness and weakness.  Endo/Heme/Allergies: Positive for environmental allergies (associated rhinorrhea). Bruises/bleeds easily.  Psychiatric/Behavioral: Negative for depression (secondary to COVID-19 pandemic- improved) and memory loss. The patient is not nervous/anxious and does not have insomnia.   All other systems reviewed and are negative.  Performance status (ECOG): 1  Vitals Blood pressure (!) 135/57, pulse 70, temperature 98.5 F (36.9 C), temperature source Oral, resp. rate 16,  weight 156 lb 3.1 oz (70.8 kg), SpO2 97 %.   Physical Exam  Constitutional: She is oriented to person, place, and time. She appears well-developed and well-nourished. No distress.  HENT:  Head: Normocephalic and atraumatic.  Mouth/Throat: Oropharynx is clear and moist. No oropharyngeal exudate.  Short gray hair.  Mask.  Eyes: Pupils are equal, round, and reactive to light. Conjunctivae and EOM are normal. No scleral icterus.  Glasses.  Brown eyes.  Neck: No JVD present.  Cardiovascular: Normal rate, regular rhythm and normal heart sounds. Exam reveals no gallop and no friction rub.  No murmur heard. Pulmonary/Chest: Effort normal and breath sounds normal. No respiratory distress. She has no wheezes. She has no rales.  Abdominal: Soft. Bowel sounds are normal. She exhibits no distension and no mass. There is no abdominal tenderness. There is no rebound and no guarding.  Musculoskeletal:        General: Edema (2+; bilateral ankle; L > R; chronic) present. Normal range of motion.     Cervical back: Normal range of motion and neck  supple.  Lymphadenopathy:       Head (right side): No preauricular, no posterior auricular and no occipital adenopathy present.       Head (left side): No preauricular, no posterior auricular and no occipital adenopathy present.    She has no cervical adenopathy.    She has no axillary adenopathy.       Right: No inguinal and no supraclavicular adenopathy present.       Left: No inguinal and no supraclavicular adenopathy present.  Neurological: She is alert and oriented to person, place, and time.  Skin: Skin is warm and dry. No rash noted. She is not diaphoretic. No erythema. No pallor.  Ecchymosis on dorsum of hands.  Psychiatric: She has a normal mood and affect. Her behavior is normal. Judgment and thought content normal.  Nursing note and vitals reviewed.   Appointment on 04/09/2019  Component Date Value Ref Range Status  . Sodium 04/09/2019 139  135 -  145 mmol/L Final  . Potassium 04/09/2019 4.3  3.5 - 5.1 mmol/L Final  . Chloride 04/09/2019 103  98 - 111 mmol/L Final  . CO2 04/09/2019 29  22 - 32 mmol/L Final  . Glucose, Bld 04/09/2019 88  70 - 99 mg/dL Final  . BUN 04/09/2019 15  8 - 23 mg/dL Final  . Creatinine, Ser 04/09/2019 0.79  0.44 - 1.00 mg/dL Final  . Calcium 04/09/2019 9.3  8.9 - 10.3 mg/dL Final  . Total Protein 04/09/2019 6.6  6.5 - 8.1 g/dL Final  . Albumin 04/09/2019 4.6  3.5 - 5.0 g/dL Final  . AST 04/09/2019 19  15 - 41 U/L Final  . ALT 04/09/2019 16  0 - 44 U/L Final  . Alkaline Phosphatase 04/09/2019 82  38 - 126 U/L Final  . Total Bilirubin 04/09/2019 1.5* 0.3 - 1.2 mg/dL Final  . GFR calc non Af Amer 04/09/2019 >60  >60 mL/min Final  . GFR calc Af Amer 04/09/2019 >60  >60 mL/min Final  . Anion gap 04/09/2019 7  5 - 15 Final   Performed at [facility] [place], [address] [place], [place] [address]  . WBC 04/09/2019 109.0* 4.0 - 10.5 K/uL Final   This critical result has verified and been called to [place] by Volney Presser on 02 15 2021 at [place], and has been read back.   Marland Kitchen RBC 04/09/2019 4.29  3.87 - 5.11 MIL/uL Final  . Hemoglobin 04/09/2019 14.1  12.0 - 15.0 g/dL Final  . HCT 04/09/2019 43.4  36.0 - 46.0 % Final  . MCV 04/09/2019 101.2* 80.0 - 100.0 fL Final  . MCH 04/09/2019 32.9  26.0 - 34.0 pg Final  . MCHC 04/09/2019 32.5  30.0 - 36.0 g/dL Final  . RDW 04/09/2019 13.8  11.5 - 15.5 % Final  . Platelets 04/09/2019 145* 150 - 400 K/uL Final  . nRBC 04/09/2019 0.0  0.0 - 0.2 % Final  . Neutrophils Relative % 04/09/2019 3  % Final  . Neutro Abs 04/09/2019 2.9  1.7 - 7.7 K/uL Final  . Lymphocytes Relative 04/09/2019 95  % Final   ABSOLUTE LYMPHOCYTOSIS  . Lymphs Abs 04/09/2019 103.7* 0.7 - 4.0 K/uL Final  . Monocytes Relative 04/09/2019 2  % Final  . Monocytes Absolute 04/09/2019 1.9* 0.1 - 1.0 K/uL Final  . Eosinophils Relative 04/09/2019 0  % Final  . Eosinophils Absolute 04/09/2019  0.2  0.0 - 0.5 K/uL Final  . Basophils Relative 04/09/2019 0  % Final  .  Basophils Absolute 04/09/2019 0.1  0.0 - 0.1 K/uL Final  . Immature Granulocytes 04/09/2019 0  % Final  . Abs Immature Granulocytes 04/09/2019 0.12* 0.00 - 0.07 K/uL Final   Performed at [facility], [address], [place], [place] [address]  . LDH 04/09/2019 136  98 - 192 U/L Final   Performed at [facility], [address], [place],  [address]    Assessment:  Katherine [name] is a 79 y.o. female with chronic lymphocytic leukemia(CLL). WBC has ranged between 22,000 - 101,7000 since 05/2011.  She has received Rituxanx 2 four week cycles (06/27/2015 and 05/27/2017). Hepatitis B surface antigen and hepatitis B surface antibody were negative on 07/04/2015.  She has a 35 pack year smoking history. She is in the low dose chest CT program. Low dose chest CTon 02/02/2018 revealed a new endobronchial lesionin the segmental bronchus to the medial segment of the right middle lobe. This may simply reflect an area of retained secretions, however, the possibility of an endobronchial neoplasm should be considered. Lung-RADS 4AS, suspicious.Low dose chest CTon 05/13/2020revealed Lung-RADS 2, benign appearance or behavior. Prior right middle lobe endobronchial lesion/mucous plugging wasno longer visualized. New mucous plugging in the right lower lobe was noted.  Symptomatically, she denies any fevers or sweats.  Weight is down 3 pounds.  Plan: 1.   Labs today: CBC with diff, CMP, direct bilirubin, LDH, uric acid, and flow cytometry. 2. Chronic lymphocytic leukemia (CLL) Clinically she denies any B symptoms. Examreveals no adenopathy or hepatosplenomegaly.  WBC has increased from 75,600 to 109,000 in past 3 months.              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     CCND1, ATM, chromosome 12, and TP53 were normal.     Check IGH/BCL2 by FISH. Discuss indications for treatment.             CLL doubling time is approximately 6 months.   Patient previously treated in 06/2015 (WBC 114,600) and in 05/2017 (WBC 101,700).   Given increasing counts, discuss plan for initiation of therapy.             Discuss consideration of ibrutinib.   Information provided. Preauth ibrutinib. 3. Health maintenance Patient is in the lung cancer screening program.  Low-dose chest CT on 07/05/2018 was benign.   Anticipate next annual chest CT around 07/05/2019.  Bilateral mammogram on 01/15/2019 revealed no evidence of malignancy.  4.   RTC prior to initiation of ibrutinib.  I discussed the assessment and treatment plan with the patient.  The patient was provided an opportunity to ask questions and all were answered.  The patient agreed with the plan and demonstrated an understanding of the instructions.  The patient was advised to call back if the symptoms worsen or if the condition fails to improve as anticipated.   Lequita Asal, MD, PhD    04/09/2019, 9:26 AM  I, Selena Batten, am acting as scribe for [employer]. Mike Gip, MD, PhD.  I, Ki Corbo C. Mike Gip, MD, have reviewed the above documentation for accuracy and completeness, and I agree with the above.

## 2019-04-06 ENCOUNTER — Other Ambulatory Visit: Payer: Self-pay

## 2019-04-06 NOTE — Progress Notes (Signed)
No new changes noted today. The patient name and DOB has been verified by phone today. 

## 2019-04-09 ENCOUNTER — Telehealth: Payer: Self-pay | Admitting: Pharmacy Technician

## 2019-04-09 ENCOUNTER — Inpatient Hospital Stay: Payer: Medicare HMO | Attending: Hematology and Oncology | Admitting: Hematology and Oncology

## 2019-04-09 ENCOUNTER — Other Ambulatory Visit: Payer: Self-pay

## 2019-04-09 ENCOUNTER — Telehealth: Payer: Self-pay

## 2019-04-09 ENCOUNTER — Inpatient Hospital Stay: Payer: Medicare HMO

## 2019-04-09 ENCOUNTER — Telehealth: Payer: Self-pay | Admitting: Pharmacist

## 2019-04-09 VITALS — BP 135/57 | HR 70 | Temp 98.5°F | Resp 16 | Wt 156.2 lb

## 2019-04-09 DIAGNOSIS — R17 Unspecified jaundice: Secondary | ICD-10-CM

## 2019-04-09 DIAGNOSIS — C9110 Chronic lymphocytic leukemia of B-cell type not having achieved remission: Secondary | ICD-10-CM

## 2019-04-09 DIAGNOSIS — Z803 Family history of malignant neoplasm of breast: Secondary | ICD-10-CM | POA: Insufficient documentation

## 2019-04-09 DIAGNOSIS — F17210 Nicotine dependence, cigarettes, uncomplicated: Secondary | ICD-10-CM | POA: Insufficient documentation

## 2019-04-09 DIAGNOSIS — R69 Illness, unspecified: Secondary | ICD-10-CM | POA: Diagnosis not present

## 2019-04-09 LAB — COMPREHENSIVE METABOLIC PANEL
ALT: 16 U/L (ref 0–44)
AST: 19 U/L (ref 15–41)
Albumin: 4.6 g/dL (ref 3.5–5.0)
Alkaline Phosphatase: 82 U/L (ref 38–126)
Anion gap: 7 (ref 5–15)
BUN: 15 mg/dL (ref 8–23)
CO2: 29 mmol/L (ref 22–32)
Calcium: 9.3 mg/dL (ref 8.9–10.3)
Chloride: 103 mmol/L (ref 98–111)
Creatinine, Ser: 0.79 mg/dL (ref 0.44–1.00)
GFR calc Af Amer: >60 mL/min (ref >60)
GFR calc non Af Amer: >60 mL/min (ref >60)
Glucose, Bld: 88 mg/dL (ref 70–99)
Potassium: 4.3 mmol/L (ref 3.5–5.1)
Sodium: 139 mmol/L (ref 135–145)
Total Bilirubin: 1.5 mg/dL — ABNORMAL HIGH (ref 0.3–1.2)
Total Protein: 6.6 g/dL (ref 6.5–8.1)

## 2019-04-09 LAB — BILIRUBIN, DIRECT: Bilirubin, Direct: 0.1 mg/dL (ref 0.0–0.2)

## 2019-04-09 LAB — URIC ACID: Uric Acid, Serum: 4.4 mg/dL (ref 2.5–7.1)

## 2019-04-09 LAB — CBC WITH DIFFERENTIAL/PLATELET
Abs Immature Granulocytes: 0.12 10*3/uL — ABNORMAL HIGH (ref 0.00–0.07)
Basophils Absolute: 0.1 10*3/uL (ref 0.0–0.1)
Basophils Relative: 0 %
Eosinophils Absolute: 0.2 10*3/uL (ref 0.0–0.5)
Eosinophils Relative: 0 %
HCT: 43.4 % (ref 36.0–46.0)
Hemoglobin: 14.1 g/dL (ref 12.0–15.0)
Immature Granulocytes: 0 %
Lymphocytes Relative: 95 %
Lymphs Abs: 103.7 10*3/uL — ABNORMAL HIGH (ref 0.7–4.0)
MCH: 32.9 pg (ref 26.0–34.0)
MCHC: 32.5 g/dL (ref 30.0–36.0)
MCV: 101.2 fL — ABNORMAL HIGH (ref 80.0–100.0)
Monocytes Absolute: 1.9 10*3/uL — ABNORMAL HIGH (ref 0.1–1.0)
Monocytes Relative: 2 %
Neutro Abs: 2.9 10*3/uL (ref 1.7–7.7)
Neutrophils Relative %: 3 %
Platelets: 145 10*3/uL — ABNORMAL LOW (ref 150–400)
RBC: 4.29 MIL/uL (ref 3.87–5.11)
RDW: 13.8 % (ref 11.5–15.5)
WBC: 109 10*3/uL (ref 4.0–10.5)
nRBC: 0 % (ref 0.0–0.2)

## 2019-04-09 LAB — LACTATE DEHYDROGENASE: LDH: 136 U/L (ref 98–192)

## 2019-04-09 MED ORDER — IBRUTINIB 420 MG PO TABS: 420.0000 mg | ORAL_TABLET | Freq: Every day | ORAL | 1 refills | Status: DC

## 2019-04-09 NOTE — Telephone Encounter (Signed)
Inbound call from [facility] in the Lab stating patient's WBC resulted at 109.0. Dr. Mike Gip made aware.

## 2019-04-09 NOTE — Patient Instructions (Signed)
Ibrutinib capsules and tablets What is this medicine? IBRUTINIB (eye BROO ti nib) is a medicine that targets proteins in cancer cells and stops the cancer cells from growing. It is used to treat mantle cell lymphoma, chronic lymphocytic leukemia, small lymphocytic lymphoma, marginal zone lymphoma, Waldenstrom macroglobulinemia, and chronic graft-versus-host disease. This medicine may be used for other purposes; ask your health care provider or pharmacist if you have questions. COMMON BRAND NAME(S): Imbruvica What should I tell my health care provider before I take this medicine? They need to know if you have any of these conditions:  bleeding disorders  diabetes  heart disease  high blood pressure  high cholesterol  history of irregular heartbeat  infection  liver disease  recent surgery  smoke tobacco  take medicines that treat or prevent blood clots  an unusual or allergic reaction to ibrutinib, other medicines, foods, dyes, or preservatives  pregnant or trying to get pregnant  breast-feeding How should I use this medicine? Take this medicine by mouth with a glass of water. Follow the directions on the prescription label. Do not cut, crush or chew this medicine. Do not take with grapefruit juice or eat [place] oranges. Take your medicine at regular intervals. Do not take it more often than directed. Do not stop taking except on your doctor's advice. Talk to your pediatrician regarding the use of this medicine in children. Special care may be needed. Overdosage: If you think you have taken too much of this medicine contact a poison control center or emergency room at once. NOTE: This medicine is only for you. Do not share this medicine with others. What if I miss a dose? If you miss a dose, take it as soon as you can. If it is almost time for your next dose, take only that dose. Do not take double or extra doses. What may interact with this medicine? This medicine may  interact with the following medications:  antiviral medications for HIV or AIDS  aprepitant  boceprevir  calcium channel blockers like diltiazem and verapamil  certain antibiotics like clarithromycin, erythromycin, and troleandomycin  certain medicines for fungal infections like fluconazole, ketoconazole, itraconazole, posaconazole, and voriconazole  certain medicines for seizures like carbamazepine and phenytoin  cimetidine  ciprofloxacin  clotrimazole  conivaptan  crizotinib  cyclosporine  digoxin  dronedarone  enzalutamide  fluvoxamine  grapefruit juice or [place] oranges  idelalisib  imatinib  methotrexate  mitotane  nefazodone  rifampin  St. John's wort  tofisopam This list may not describe all possible interactions. Give your health care provider a list of all the medicines, herbs, non-prescription drugs, or dietary supplements you use. Also tell them if you smoke, drink alcohol, or use illegal drugs. Some items may interact with your medicine. What should I watch for while using this medicine? You may need blood work done while you are taking this medicine. This medicine may increase your risk to bruise or bleed. Call your doctor or health care professional if you notice any unusual bleeding. Call your doctor or health care professional for advice if you get a fever, chills or sore throat, or other symptoms of a cold or flu. Do not treat yourself. This drug decreases your body's ability to fight infections. Try to avoid being around people who are sick. If you are going to have surgery or any other procedures, tell your doctor you are taking this medicine. Tell your dentist and dental surgeon that you are taking this medicine. You should not have major dental   surgery while on this medicine. See your dentist to have a dental exam and fix any dental problems before starting this medicine. Talk to your doctor about your risk of cancer. You may be more at  risk for certain types of cancers if you take this medicine. Do not become pregnant while taking this medicine or for 1 month after stopping it. Women should inform their doctor if they wish to become pregnant or think they might be pregnant. Men should not father a child while taking this medicine and for 1 month after stopping it. There is a potential for serious side effects to an unborn child. Talk to your health care professional or pharmacist for more information. Do not breast-feed an infant while taking this medicine or for 1 week after stopping it. What side effects may I notice from receiving this medicine? Side effects that you should report to your doctor or health care professional as soon as possible:  allergic reactions like skin rash, itching or hives, swelling of the face, lips, or tongue  low blood counts - this medicine may decrease the number of white blood cells, red blood cells and platelets. You may be at increased risk for infections and bleeding  signs or symptoms of bleeding such as bloody or black, tarry stools; red or dark-brown urine; spitting up blood or brown material that looks like coffee grounds; red spots on the skin; unusual bruising or bleeding from the eye, gums, or nose; confusion; trouble speaking or understanding; severe headaches; weakness; or dizziness  signs and symptoms of a dangerous change in heartbeat or heart rhythm like chest pain; dizziness; fast or irregular heartbeat; palpitations; feeling faint or lightheaded, falls; breathing problems  signs and symptoms of infection like fever or chills; cough; sore throat; or pain when urinating  signs and symptoms of kidney injury like trouble passing urine or change in the amount of urine Side effects that usually do not require medical attention (report to your doctor or health care professional if they continue or are bothersome):  bone pain  diarrhea  mouth sores  muscle cramps  muscle  pain  nausea  tiredness This list may not describe all possible side effects. Call your doctor for medical advice about side effects. You may report side effects to FDA at 1-800-FDA-1088. Where should I keep my medicine? Keep out of the reach of children. Store between 20 and 25 degrees C (68 and 77 degrees F). Keep this medicine in the original container. Throw away any unused medicine after the expiration date. NOTE: This sheet is a summary. It may not cover all possible information. If you have questions about this medicine, talk to your doctor, pharmacist, or health care provider.  2020 Elsevier/Gold Standard (2018-06-15 10:56:02)  

## 2019-04-09 NOTE — Telephone Encounter (Signed)
Oral Oncology Patient Advocate Encounter  Received notification from [employer] that prior authorization for Imbruvica is required.  PA submitted on CoverMyMeds Key [id number] Status is pending  Oral Oncology Clinic will continue to follow.  [place] Patient [place] Phone [phone] Fax [phone] 04/09/2019 11:12 AM

## 2019-04-09 NOTE — Telephone Encounter (Signed)
Oral Oncology Pharmacist Encounter  Received new prescription for Imbruvica (ibrutinib) for the treatment of CLL, planned duration no relevant lab abnormalities.  CMP from 04/09/19 assessed, no relevant lab abnormalities. Prescription dose and frequency assessed.   Current medication list in Epic reviewed, no relevant DDIs with ibrutinib identified.  Prescription has been e-scribed to the [facility] for benefits analysis and approval.  Oral Oncology Clinic will continue to follow for insurance authorization, copayment issues, initial counseling and start date.  [name], PharmD, BCPS, [facility] Hematology/Oncology Clinical Pharmacist [facility]/AP Oral [place] [phone]  04/09/2019 9:56 AM

## 2019-04-09 NOTE — Telephone Encounter (Signed)
Oral Oncology Patient Advocate Encounter  Prior Authorization for Kate Sable has been approved.    PA# [id number]  Effective dates: 02/23/19 through 02/22/20  Patients co-pay is $2967.60.  Will reach out to patient about copay foundation assistance to help with high copay.  Oral Oncology Clinic will continue to follow.   [place] Patient [place] Phone [phone] Fax [phone] 04/09/2019 11:14 AM

## 2019-04-11 ENCOUNTER — Telehealth: Payer: Self-pay | Admitting: Pharmacy Technician

## 2019-04-11 LAB — COMP PANEL: LEUKEMIA/LYMPHOMA: Immunophenotypic Profile: 88

## 2019-04-11 NOTE — Telephone Encounter (Signed)
Oral Oncology Patient Advocate Encounter   Was successful in securing patient an $55,700 grant from Patient [place] [facility]) to provide copayment coverage for Imbruvica.  This will keep the out of pocket expense at $0.     I have spoken with the patient.    The billing information is as follows and has been shared with [place].   Member ID: [id number] Group ID: [id number] RxBin: [id number] Dates of Eligibility: 01/11/2019 through 04/09/2020  Fund:  [place] Patient [place] Phone [phone] Fax [phone] 04/11/2019 1:54 PM

## 2019-04-13 ENCOUNTER — Telehealth: Payer: Self-pay | Admitting: Pharmacy Technician

## 2019-04-13 NOTE — Telephone Encounter (Signed)
Oral Chemotherapy Pharmacist Encounter  Katherine [name] will mail out the Imbruvica to Katherine [name] early next week.  Patient Education I spoke with patient for overview of new oral chemotherapy medication: Imbruvica (ibrutinib) for the treatment of CLL, planned duration no relevant lab abnormalities.   Counseled patient on administration, dosing, side effects, monitoring, drug-food interactions, safe handling, storage, and disposal. Patient will take 420 mg by mouth daily.  Side effects include but not limited to: decreased wbc/hgb/plt, fatigue, N/V, diarrhea.    Reviewed with patient importance of keeping a medication schedule and plan for any missed doses.  Katherine [name] voiced understanding and appreciation. All questions answered. Medication handout placed in the mail.  Provided patient with [facility] phone number. Patient knows to call the office with questions or concerns. Oral Chemotherapy Navigation Clinic will continue to follow.  [name], PharmD, BCPS, [facility] Hematology/Oncology Clinical Pharmacist [facility] Oral [place] [facility] [phone]  04/13/2019 3:31 PM

## 2019-04-13 NOTE — Telephone Encounter (Signed)
Oral Oncology Patient Advocate Encounter   Was successful in securing patient a $10,000 grant from [place] to provide copayment coverage for Imbruvica.  This will keep the out of pocket expense at $0.     I have spoken with the patient.    The billing information is as follows and has been shared with [place].   Member ID: [id number] Group ID: [id number] RxBin: [id number] PCN: [id number] Dates of Eligibility: 04/13/19 through 04/12/20  Fund name:  [place] Patient [place] Phone [phone] Fax [phone] 04/13/2019 4:03 PM

## 2019-04-16 MED FILL — IMBRUVICA 420 MG TAB: 420 | 28 days supply | Qty: 28 | Fill #0

## 2019-04-22 NOTE — Progress Notes (Signed)
[facility]  [address] [place], [place] [address] Phone: [phone]  Fax: [phone]   Clinic Day:  04/24/2019  Referring physician: [name], MD  Chief Complaint: Katherine [name] is a 79 y.o. female with chronic lymphocytic leukemia (CLL) who is seen for a 3 week assessment and initiation of ibrutinib.   HPI: The patient was last seen in the medical oncology clinic on 04/09/2019. At that time, she denied any fevers or sweats. Weight was down 3 pounds. We discussed the next annual chest CT for 06/2019.   Labs revealed a hematocrit 43.4, hemoglobin 14.1, platelets 145,000, WBC 109,000 ([place] 2,900; ALC 103,700), monocyte count 1,900. Total bilirubin 1.5. Direct bilirubin 0.1. Uric acid 4.4. LDH 136. FISH oncology is pending.   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  There was a CD5 and CD23 positive monoclonal B cell population with lambda light chain restriction, negative for FMC7 and CD38, representing  88% of leukocytes, >5,000/uL. There was no loss of, or aberrant expression of, the pan T cell  antigens to  suggest a neoplastic T cell process. CD4:CD8 ratio 2.0  No circulating blasts were detected. There was no immunophenotypic evidence of abnormal myeloid maturation.   During the interim, the patient has felt "pretty good". She is eating well. She has bilateral ankle and feet edema. She does not wear compression stockings because it makes her feel too hot. I recommended trying the stockings since it is winter time.  Swelling goes down at night. She is drinking a lot of water. She stopped prednisone on 04/21/2019. She denies any fevers. She has night sweats (not new). Her headaches have resolved.   She reports bruising on her legs. She denies any trauma. She notes a tic bite back in the summer of 2020. She is on aspirin. She denies any chest pains or palpitations.     Past Medical History:   Diagnosis Date  . Allergy   . Anxiety   . Depression   . GERD (gastroesophageal reflux disease)   . Hemorrhoids   . Leukemia, lymphoid ([place])    CLL  . Lobar pneumonia ([place])   . Osteopenia   . Personal history of chemotherapy     Past Surgical History:  Procedure Laterality Date  . ABDOMINAL HYSTERECTOMY    . APPENDECTOMY    . BREAST BIOPSY Left    bx x 3-neg  . COLON SURGERY     sigmoid resection  . COLONOSCOPY     2007, 2012  . COLONOSCOPY WITH PROPOFOL N/A 09/17/2015   Procedure: COLONOSCOPY WITH PROPOFOL;  Surgeon: Robert Bellow, MD;  Location: [facility] ENDOSCOPY;  Service: Endoscopy;  Laterality: N/A;  . OOPHORECTOMY    . SPINE SURGERY     L4-5    Family History  Problem Relation Age of Onset  . Osteoporosis Mother   . Hypertension Father   . Heart attack Father   . Stroke Maternal Grandfather   . Breast cancer Sister 52    Social History:  reports that she has been smoking cigarettes. She has a 8.75 pack-year smoking history. She has never used smokeless tobacco. She reports that she does not drink alcohol or use drugs. Currently smokes 5 cigarettes daily, but is working toward quitting. She previously smoked a pack a day for a "longtime".She lives in [place], [place].  The patient is accompanied by her daughter, Katherine [name], today on the Ipad.  Allergies:  Allergies  Allergen Reactions  .  Augmentin [Amoxicillin-Pot Clavulanate] Swelling    tongue  . Biaxin [Clarithromycin] Swelling and Rash    tongue    Current Medications: Current Outpatient Medications  Medication Sig Dispense Refill  . aspirin EC 81 MG tablet Take 81 mg by mouth every other day.     Marland Kitchen atorvastatin (LIPITOR) 10 MG tablet Take 1 tablet (10 mg total) by mouth daily. 90 tablet 1  . benazepril (LOTENSIN) 40 MG tablet Take 1 tablet (40 mg total) by mouth daily. 90 tablet 1  . diphenhydrAMINE (BENADRYL) 25 MG tablet Take 25 mg by mouth at bedtime as needed.    . Ibrutinib 420 MG TABS Take 420 mg by  mouth daily. 30 tablet 1  . LORazepam (ATIVAN) 1 MG tablet Take 0.5 tablets (0.5 mg total) by mouth 2 (two) times daily as needed for anxiety. 30 tablet 5  . Multiple Vitamins-Minerals (MULTIVITAMIN WITH MINERALS) tablet Take 1 tablet by mouth daily.    Marland Kitchen omeprazole (PRILOSEC) 20 MG capsule Take 1 capsule (20 mg total) by mouth daily as needed. 90 capsule 1  . PARoxetine (PAXIL) 30 MG tablet Take 1 tablet (30 mg total) by mouth daily. 90 tablet 1  . Triamcinolone Acetonide (NASACORT AQ NA) Place 2 Doses into the nose daily as needed.     . predniSONE (DELTASONE) 10 MG tablet 6 tabs today and tomorrow, 5 tabs the next 2 days, decrease by 1 every other day until gone (Patient not taking: Reported on 04/24/2019) 42 tablet 0   No current facility-administered medications for this visit.    Review of Systems  Constitutional: Positive for weight loss (2 lbs). Negative for chills, diaphoresis, fever and malaise/fatigue.       Feels "pretty good".  HENT: Negative.  Negative for congestion, ear pain, hearing loss, nosebleeds, sinus pain and sore throat.   Eyes: Negative.  Negative for blurred vision, double vision, photophobia and pain.  Respiratory: Negative.  Negative for cough, hemoptysis, sputum production and shortness of breath.   Cardiovascular: Positive for leg swelling (ankles and feet). Negative for chest pain, palpitations, orthopnea and PND.  Gastrointestinal: Negative.  Negative for abdominal pain, blood in stool, constipation, diarrhea, heartburn, melena, nausea and vomiting.       Eating well. Drinking a lot of water.  Genitourinary: Negative.  Negative for dysuria, frequency, hematuria and urgency.  Musculoskeletal: Negative for back pain, joint pain (arthritis), myalgias and neck pain.  Skin: Negative.  Negative for itching and rash.  Neurological: Negative for dizziness, tingling, speech change, focal weakness, weakness and headaches.  Endo/Heme/Allergies: Negative for environmental  allergies. Bruises/bleeds easily (bruising on legs).  Psychiatric/Behavioral: Negative for depression and memory loss. The patient is not nervous/anxious and does not have insomnia.   All other systems reviewed and are negative.  Performance status (ECOG): 1  Vitals Blood pressure (!) 127/59, pulse 88, temperature (!) 97 F (36.1 C), temperature source Tympanic, resp. rate 18, weight 154 lb 5.2 oz (70 kg), SpO2 98 %.   Physical Exam  Constitutional: She is oriented to person, place, and time. She appears well-developed and well-nourished. No distress.  HENT:  Head: Normocephalic and atraumatic.  Mouth/Throat: Oropharynx is clear and moist. No oropharyngeal exudate.  Short gray hair.  Mask.  Eyes: Pupils are equal, round, and reactive to light. Conjunctivae and EOM are normal. No scleral icterus.  Glasses.  Brown eyes.  Neck: No JVD present.  Cardiovascular: Normal rate, regular rhythm and normal heart sounds. Exam reveals no gallop and no friction  rub.  No murmur heard. Pulmonary/Chest: Effort normal and breath sounds normal. No respiratory distress. She has no wheezes. She has no rales.  Abdominal: Soft. Bowel sounds are normal. She exhibits no distension and no mass. There is no abdominal tenderness. There is no rebound and no guarding.  Musculoskeletal:        General: Edema (2+ chronic bilateral ankle) present. No tenderness. Normal range of motion.     Cervical back: Normal range of motion and neck supple.  Lymphadenopathy:       Head (right side): No preauricular, no posterior auricular and no occipital adenopathy present.       Head (left side): No preauricular, no posterior auricular and no occipital adenopathy present.    She has no cervical adenopathy.    She has no axillary adenopathy.       Right: No supraclavicular adenopathy present.       Left: No supraclavicular adenopathy present.  Neurological: She is alert and oriented to person, place, and time.  Skin: Skin is  warm and dry. Ecchymosis (bilateral arms and  legs) noted. No rash noted. She is not diaphoretic. No erythema. No pallor.  Psychiatric: She has a normal mood and affect. Her behavior is normal. Judgment and thought content normal.  Nursing note and vitals reviewed.   Appointment on 04/24/2019  Component Date Value Ref Range Status  . Uric Acid, Serum 04/24/2019 4.6  2.5 - 7.1 mg/dL Final   Performed at [facility], [address], [place], [place] [address]  . LDH 04/24/2019 149  98 - 192 U/L Final   Performed at [facility] [address] [place], [place] [address]  . Sodium 04/24/2019 138  135 - 145 mmol/L Final  . Potassium 04/24/2019 4.0  3.5 - 5.1 mmol/L Final  . Chloride 04/24/2019 102  98 - 111 mmol/L Final  . CO2 04/24/2019 23  22 - 32 mmol/L Final  . Glucose, Bld 04/24/2019 92  70 - 99 mg/dL Final   Glucose reference range applies only to samples taken after fasting for at least 8 hours.  . BUN 04/24/2019 10  8 - 23 mg/dL Final  . Creatinine, Ser 04/24/2019 0.85  0.44 - 1.00 mg/dL Final  . Calcium 04/24/2019 9.4  8.9 - 10.3 mg/dL Final  . Total Protein 04/24/2019 6.9  6.5 - 8.1 g/dL Final  . Albumin 04/24/2019 4.8  3.5 - 5.0 g/dL Final  . AST 04/24/2019 22  15 - 41 U/L Final  . ALT 04/24/2019 16  0 - 44 U/L Final  . Alkaline Phosphatase 04/24/2019 87  38 - 126 U/L Final  . Total Bilirubin 04/24/2019 1.1  0.3 - 1.2 mg/dL Final  . GFR calc non Af Amer 04/24/2019 >60  >60 mL/min Final  . GFR calc Af Amer 04/24/2019 >60  >60 mL/min Final  . Anion gap 04/24/2019 13  5 - 15 Final   Performed at [facility], [address] [place], [place] [address]  . WBC 04/24/2019 107.2* 4.0 - 10.5 K/uL Final   Comment: This critical result has verified and been called to Anthony by Volney Presser on 03 02 2021 at 1039, and has been read back.  This critical result has verified and been called to [place] by Volney Presser on 03 02  2021 at 1042, and has been read back.    Marland Kitchen RBC 04/24/2019 4.41  3.87 - 5.11 MIL/uL Final  . Hemoglobin 04/24/2019 14.4  12.0 - 15.0 g/dL Final  . HCT 04/24/2019 44.5  36.0 - 46.0 % Final  . MCV 04/24/2019 100.9* 80.0 - 100.0 fL Final  . MCH 04/24/2019 32.7  26.0 - 34.0 pg Final  . MCHC 04/24/2019 32.4  30.0 - 36.0 g/dL Final  . RDW 04/24/2019 13.9  11.5 - 15.5 % Final  . Platelets 04/24/2019 161  150 - 400 K/uL Final  . nRBC 04/24/2019 0.0  0.0 - 0.2 % Final  . Neutrophils Relative % 04/24/2019 4  % Final  . Neutro Abs 04/24/2019 3.8  1.7 - 7.7 K/uL Final  . Lymphocytes Relative 04/24/2019 93  % Final  . Lymphs Abs 04/24/2019 99.5* 0.7 - 4.0 K/uL Final  . Monocytes Relative 04/24/2019 3  % Final  . Monocytes Absolute 04/24/2019 3.3* 0.1 - 1.0 K/uL Final  . Eosinophils Relative 04/24/2019 0  % Final  . Eosinophils Absolute 04/24/2019 0.3  0.0 - 0.5 K/uL Final  . Basophils Relative 04/24/2019 0  % Final  . Basophils Absolute 04/24/2019 0.1  0.0 - 0.1 K/uL Final  . Immature Granulocytes 04/24/2019 0  % Final  . Abs Immature Granulocytes 04/24/2019 0.16* 0.00 - 0.07 K/uL Final   Performed at [facility], [address] [place], [place] [address]    Assessment:  Katherine [name]  is a 79 y.o. female with chronic lymphocytic leukemia(CLL). WBC has ranged between 22,000 - 101,7000 since 05/2011.   She has received Rituxanx 2 four week cycles (06/27/2015 and 05/27/2017). Hepatitis B surface antigen and hepatitis B surface antibody were negative on 07/04/2015.  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  CCND1, ATM, chromosome 12, and TP53 were normal.    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  There was a CD5 and CD23 positive monoclonal B cell population with lambda light chain restriction, negative for FMC7 and CD38,  representing  88% of leukocytes, >5,000/uL. There was no loss of, or aberrant expression of, the pan T cell  antigens to  suggest a neoplastic T cell process. CD4:CD8 ratio 2.0  No circulating blasts were detected. There was no immunophenotypic evidence of abnormal myeloid maturation.  IGH/BCL2 by FISH is pending.  She has a 35 pack year smoking history. She is in the low dose chest CT program. Low dose chest CTon 02/02/2018 revealed a new endobronchial lesionin the segmental bronchus to the medial segment of the right middle lobe. This may simply reflect an area of retained secretions, however, the possibility of an endobronchial neoplasm should be considered. Lung-RADS 4AS, suspicious.Low dose chest CTon 05/13/2020revealed Lung-RADS 2, benign appearance or behavior. Prior right middle lobe endobronchial lesion/mucous plugging wasno longer visualized. New mucous plugging in the right lower lobe.  Symptomatically, she feels "pretty good".   She has chronic sweats.  Exam reveals no adenopathy or hepatosplenomegaly.  She bruising (upper > lower extremities) on aspirin.  Plan: 1.   Labs today:  CBC with diff, CMP, LDH, uric acid. 2. Chronic lymphocytic leukemia (CLL) Clinically denies any fevers or weight loss. Exam reveals no adenopathy or hepatosplenomegaly. WBC has increased from 75,600 to 109,000 in past 3 months.   CLL doubling time is approximately 6 months.   WBC is 107,000 today. 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  CCND1, ATM, chromosome 12, and TP53 were normal.                           IGH/BCL2 by FISH is pending. Discuss initiation of ibrutinib.   We discussed the potential side effects at last visit.    We discussed monitoring side effects closely.      We reviewed issues with increasing WBC which can  last up to 14 weeks   She has reviewed the information on ibrutinib.   She has spoken with Elesa Massed, pharmacist and oral chemotherapy.   She feels comfortable initiating therapy.  Consent was provided.  Her ibrutinib package was opened.  Medications and dose was confirmed.   Several questions were asked and answered.  3. Health maintenance Patient is in the lung cancer screening program.             Low-dose chest CT on05/13/2020 was benign.             Next annual low dose chest CT due on 07/05/2019.             Bilateral mammogram on 01/15/2019 revealed no evidence of malignancy.    Continue yearly mammograms. 4.   RTC 1 week for MD assessment and labs (CBC with diff, BMP, uric acid).  I discussed the assessment and treatment plan with the patient.  The patient was provided an opportunity to ask questions and all were answered.  The patient agreed with the plan and demonstrated an understanding of the instructions.  The patient was advised to call back if the symptoms worsen or if the condition fails to improve as anticipated.  I provided 22 minutes of face-to-face time during this this encounter and > 50% was spent counseling as documented under my assessment and plan.    Lequita Asal, MD, PhD    04/24/2019, 11:05 AM  I, Selena Batten, am acting as scribe for [employer]. Mike Gip, MD, PhD.  I, Melissa C. Mike Gip, MD, have reviewed the above documentation for accuracy and completeness, and I agree with the above.

## 2019-04-24 ENCOUNTER — Encounter: Payer: Self-pay | Admitting: Hematology and Oncology

## 2019-04-24 ENCOUNTER — Inpatient Hospital Stay: Payer: Medicare HMO

## 2019-04-24 ENCOUNTER — Other Ambulatory Visit: Payer: Self-pay | Admitting: Hematology and Oncology

## 2019-04-24 ENCOUNTER — Inpatient Hospital Stay: Payer: Medicare HMO | Attending: Hematology and Oncology | Admitting: Hematology and Oncology

## 2019-04-24 ENCOUNTER — Telehealth: Payer: Self-pay

## 2019-04-24 ENCOUNTER — Other Ambulatory Visit: Payer: Self-pay

## 2019-04-24 VITALS — BP 127/59 | HR 88 | Temp 97.0°F | Resp 18 | Wt 154.3 lb

## 2019-04-24 DIAGNOSIS — C9110 Chronic lymphocytic leukemia of B-cell type not having achieved remission: Secondary | ICD-10-CM

## 2019-04-24 DIAGNOSIS — F17210 Nicotine dependence, cigarettes, uncomplicated: Secondary | ICD-10-CM | POA: Insufficient documentation

## 2019-04-24 DIAGNOSIS — Z803 Family history of malignant neoplasm of breast: Secondary | ICD-10-CM | POA: Diagnosis not present

## 2019-04-24 DIAGNOSIS — R69 Illness, unspecified: Secondary | ICD-10-CM | POA: Diagnosis not present

## 2019-04-24 LAB — COMPREHENSIVE METABOLIC PANEL
ALT: 16 U/L (ref 0–44)
AST: 22 U/L (ref 15–41)
Albumin: 4.8 g/dL (ref 3.5–5.0)
Alkaline Phosphatase: 87 U/L (ref 38–126)
Anion gap: 13 (ref 5–15)
BUN: 10 mg/dL (ref 8–23)
CO2: 23 mmol/L (ref 22–32)
Calcium: 9.4 mg/dL (ref 8.9–10.3)
Chloride: 102 mmol/L (ref 98–111)
Creatinine, Ser: 0.85 mg/dL (ref 0.44–1.00)
GFR calc Af Amer: >60 mL/min (ref >60)
GFR calc non Af Amer: >60 mL/min (ref >60)
Glucose, Bld: 92 mg/dL (ref 70–99)
Potassium: 4 mmol/L (ref 3.5–5.1)
Sodium: 138 mmol/L (ref 135–145)
Total Bilirubin: 1.1 mg/dL (ref 0.3–1.2)
Total Protein: 6.9 g/dL (ref 6.5–8.1)

## 2019-04-24 LAB — CBC WITH DIFFERENTIAL/PLATELET
Abs Immature Granulocytes: 0.16 10*3/uL — ABNORMAL HIGH (ref 0.00–0.07)
Basophils Absolute: 0.1 10*3/uL (ref 0.0–0.1)
Basophils Relative: 0 %
Eosinophils Absolute: 0.3 10*3/uL (ref 0.0–0.5)
Eosinophils Relative: 0 %
HCT: 44.5 % (ref 36.0–46.0)
Hemoglobin: 14.4 g/dL (ref 12.0–15.0)
Immature Granulocytes: 0 %
Lymphocytes Relative: 93 %
Lymphs Abs: 99.5 10*3/uL — ABNORMAL HIGH (ref 0.7–4.0)
MCH: 32.7 pg (ref 26.0–34.0)
MCHC: 32.4 g/dL (ref 30.0–36.0)
MCV: 100.9 fL — ABNORMAL HIGH (ref 80.0–100.0)
Monocytes Absolute: 3.3 10*3/uL — ABNORMAL HIGH (ref 0.1–1.0)
Monocytes Relative: 3 %
Neutro Abs: 3.8 10*3/uL (ref 1.7–7.7)
Neutrophils Relative %: 4 %
Platelets: 161 10*3/uL (ref 150–400)
RBC: 4.41 MIL/uL (ref 3.87–5.11)
RDW: 13.9 % (ref 11.5–15.5)
WBC: 107.2 10*3/uL (ref 4.0–10.5)
nRBC: 0 % (ref 0.0–0.2)

## 2019-04-24 LAB — LACTATE DEHYDROGENASE: LDH: 149 U/L (ref 98–192)

## 2019-04-24 LAB — URIC ACID: Uric Acid, Serum: 4.6 mg/dL (ref 2.5–7.1)

## 2019-04-24 NOTE — Progress Notes (Signed)
Patient just has general questions about new medication such as side effects, how long she will be on them, and will she lose her hair.

## 2019-04-24 NOTE — Telephone Encounter (Signed)
Got a call from [facility] in the lab WBC 107.24. MD made aware ( secure chat)

## 2019-04-27 LAB — FISH ONCOLOGY
Cells Analyzed: 200
Cells Counted: 200

## 2019-04-30 ENCOUNTER — Other Ambulatory Visit: Payer: Self-pay

## 2019-04-30 ENCOUNTER — Encounter: Payer: Self-pay | Admitting: Oncology

## 2019-04-30 ENCOUNTER — Inpatient Hospital Stay: Payer: Medicare HMO

## 2019-04-30 ENCOUNTER — Inpatient Hospital Stay (HOSPITAL_BASED_OUTPATIENT_CLINIC_OR_DEPARTMENT_OTHER): Payer: Medicare HMO | Admitting: Oncology

## 2019-04-30 VITALS — BP 126/62 | HR 74 | Temp 97.7°F | Resp 18 | Ht 66.0 in | Wt 156.0 lb

## 2019-04-30 DIAGNOSIS — Z803 Family history of malignant neoplasm of breast: Secondary | ICD-10-CM | POA: Diagnosis not present

## 2019-04-30 DIAGNOSIS — C9110 Chronic lymphocytic leukemia of B-cell type not having achieved remission: Secondary | ICD-10-CM | POA: Diagnosis not present

## 2019-04-30 DIAGNOSIS — R69 Illness, unspecified: Secondary | ICD-10-CM | POA: Diagnosis not present

## 2019-04-30 LAB — CBC WITH DIFFERENTIAL/PLATELET
Abs Immature Granulocytes: 0.31 10*3/uL — ABNORMAL HIGH (ref 0.00–0.07)
Basophils Absolute: 0.1 10*3/uL (ref 0.0–0.1)
Basophils Relative: 0 %
Eosinophils Absolute: 0.2 10*3/uL (ref 0.0–0.5)
Eosinophils Relative: 0 %
HCT: 43.5 % (ref 36.0–46.0)
Hemoglobin: 13.5 g/dL (ref 12.0–15.0)
Immature Granulocytes: 0 %
Lymphocytes Relative: 96 %
Lymphs Abs: 199.2 10*3/uL — ABNORMAL HIGH (ref 0.7–4.0)
MCH: 32.1 pg (ref 26.0–34.0)
MCHC: 31 g/dL (ref 30.0–36.0)
MCV: 103.6 fL — ABNORMAL HIGH (ref 80.0–100.0)
Monocytes Absolute: 3.2 10*3/uL — ABNORMAL HIGH (ref 0.1–1.0)
Monocytes Relative: 2 %
Neutro Abs: 3.7 10*3/uL (ref 1.7–7.7)
Neutrophils Relative %: 2 %
Platelets: 140 10*3/uL — ABNORMAL LOW (ref 150–400)
RBC: 4.2 MIL/uL (ref 3.87–5.11)
RDW: 13.7 % (ref 11.5–15.5)
WBC: 206.8 10*3/uL (ref 4.0–10.5)
nRBC: 0 % (ref 0.0–0.2)

## 2019-04-30 LAB — BASIC METABOLIC PANEL
Anion gap: 11 (ref 5–15)
BUN: 10 mg/dL (ref 8–23)
CO2: 26 mmol/L (ref 22–32)
Calcium: 9.4 mg/dL (ref 8.9–10.3)
Chloride: 100 mmol/L (ref 98–111)
Creatinine, Ser: 0.8 mg/dL (ref 0.44–1.00)
GFR calc Af Amer: >60 mL/min (ref >60)
GFR calc non Af Amer: >60 mL/min (ref >60)
Glucose, Bld: 105 mg/dL — ABNORMAL HIGH (ref 70–99)
Potassium: 4.2 mmol/L (ref 3.5–5.1)
Sodium: 137 mmol/L (ref 135–145)

## 2019-04-30 LAB — URIC ACID: Uric Acid, Serum: 4.8 mg/dL (ref 2.5–7.1)

## 2019-04-30 NOTE — Progress Notes (Signed)
[facility]  [address], [address] [place], [place] [address] Phone: [phone]  Fax: [phone]   Clinic Day:  04/30/2019  Referring physician: [name], MD  Chief Complaint: [name] is a 79 y.o. female with chronic lymphocytic leukemia (CLL) who is seen for a 1 week assessment and labs after initiation of ibrutinib.   HPI: The patient was last seen in the medical oncology clinic on 04/24/2019 where she admitted to feeling pretty good.  Her appetite had improved.  She had recently stopped prednisone.  She denied any fevers, night sweats or headaches.  Noted mild bruising on her legs.  She was started on ibrutinib.  Her white count increased to 107,200. Marland Kitchen   Flow cytometry on 04/09/2019 confirmed chonic lymphocytic leukemia, negative for CD38.  There was a CD5 and CD23 positive monoclonal B cell population with lambda light chain restriction, negative for FMC7 and CD38, representing  88% of leukocytes, >5,000/uL. There was no loss of, or aberrant expression of, the pan T cell  antigens to  suggest a neoplastic T cell process. CD4:CD8 ratio 2.0  No circulating blasts were detected. There was no immunophenotypic evidence of abnormal myeloid maturation.   During the interim, the patient has felt "pretty good".  Her appetite remained stable.  She noted increasing diarrhea since starting ibrutinib she is started taking Kaopectate yesterday and has not noticed a significant decrease in the frequency of her diarrhea.  Otherwise, she feels well.  She denies any abdominal pain, chest pain, nausea, vomiting or urinary concerns.   Past Medical History:  Diagnosis Date  . Allergy   . Anxiety   . Depression   . GERD (gastroesophageal reflux disease)   . Hemorrhoids   . Leukemia, lymphoid ([place])    CLL  . Lobar pneumonia ([place])   . Osteopenia   . Personal history of chemotherapy     Past Surgical History:  Procedure Laterality Date  . ABDOMINAL  HYSTERECTOMY    . APPENDECTOMY    . BREAST BIOPSY Left    bx x 3-neg  . COLON SURGERY     sigmoid resection  . COLONOSCOPY     2007, 2012  . COLONOSCOPY WITH PROPOFOL N/A 09/17/2015   Procedure: COLONOSCOPY WITH PROPOFOL;  Surgeon: Robert Bellow, MD;  [facility]: [facility] ENDOSCOPY;  Service: Endoscopy;  Laterality: N/A;  . OOPHORECTOMY    . SPINE SURGERY     L4-5    Family History  Problem Relation Age of Onset  . Osteoporosis Mother   . Hypertension Father   . Heart attack Father   . Stroke Maternal Grandfather   . Breast cancer Sister 69    Social History:  reports that she has been smoking cigarettes. She has a 8.75 pack-year smoking history. She has never used smokeless tobacco. She reports that she does not drink alcohol or use drugs. Currently smokes 5 cigarettes daily, but is working toward quitting. She previously smoked a pack a day for a "longtime".She lives in [place], [place].  The patient is accompanied by her daughter, [name], today on the Ipad.  Allergies:  Allergies  Allergen Reactions  . Augmentin [Amoxicillin-Pot Clavulanate] Swelling    tongue  . Biaxin [Clarithromycin] Swelling and Rash    tongue    Current Medications: Current Outpatient Medications  Medication Sig Dispense Refill  . aspirin EC 81 MG tablet Take 81 mg by mouth every other day.     Marland Kitchen atorvastatin (LIPITOR) 10 MG tablet Take  1 tablet (10 mg total) by mouth daily. 90 tablet 1  . benazepril (LOTENSIN) 40 MG tablet Take 1 tablet (40 mg total) by mouth daily. 90 tablet 1  . diphenhydrAMINE (BENADRYL) 25 MG tablet Take 25 mg by mouth at bedtime as needed.    . Ibrutinib 420 MG TABS Take 420 mg by mouth daily. 30 tablet 1  . LORazepam (ATIVAN) 1 MG tablet Take 0.5 tablets (0.5 mg total) by mouth 2 (two) times daily as needed for anxiety. 30 tablet 5  . Multiple Vitamins-Minerals (MULTIVITAMIN WITH MINERALS) tablet Take 1 tablet by mouth daily.    Marland Kitchen omeprazole (PRILOSEC) 20 MG capsule Take 1 capsule  (20 mg total) by mouth daily as needed. 90 capsule 1  . PARoxetine (PAXIL) 30 MG tablet Take 1 tablet (30 mg total) by mouth daily. 90 tablet 1  . predniSONE (DELTASONE) 10 MG tablet 6 tabs today and tomorrow, 5 tabs the next 2 days, decrease by 1 every other day until gone (Patient not taking: Reported on 04/24/2019) 42 tablet 0  . Triamcinolone Acetonide (NASACORT AQ NA) Place 2 Doses into the nose daily as needed.      No current facility-administered medications for this visit.    Review of Systems  Constitutional: Positive for malaise/fatigue. Negative for chills, fever and weight loss.  HENT: Negative for congestion, ear pain and tinnitus.   Eyes: Negative.  Negative for blurred vision and double vision.  Respiratory: Negative.  Negative for cough, sputum production and shortness of breath.   Cardiovascular: Negative.  Negative for chest pain, palpitations and leg swelling.  Gastrointestinal: Positive for diarrhea. Negative for abdominal pain, constipation, nausea and vomiting.  Genitourinary: Negative for dysuria, frequency and urgency.  Musculoskeletal: Negative for back pain and falls.  Skin: Negative.  Negative for rash.  Neurological: Negative.  Negative for weakness and headaches.  Endo/Heme/Allergies: Negative.  Does not bruise/bleed easily.  Psychiatric/Behavioral: Negative.  Negative for depression. The patient is not nervous/anxious and does not have insomnia.    Performance status (ECOG): 1  Vitals There were no vitals taken for this visit.   Physical Exam  Constitutional: She is oriented to person, place, and time. Vital signs are normal. She appears well-developed and well-nourished.  HENT:  Head: Normocephalic and atraumatic.  Eyes: Pupils are equal, round, and reactive to light.  Cardiovascular: Normal rate and regular rhythm.  No murmur heard. Pulmonary/Chest: Effort normal and breath sounds normal. She has no wheezes.  Abdominal: Soft. Bowel sounds are normal.  She exhibits no distension and no mass. There is no abdominal tenderness.  Musculoskeletal:        General: Edema (trace) present. Normal range of motion.     Cervical back: Normal range of motion.  Neurological: She is alert and oriented to person, place, and time.  Skin: Skin is warm and dry.  Psychiatric: She has a normal mood and affect. Her behavior is normal.    No visits with results within 3 Day(s) from this visit.  Latest known visit with results is:  Appointment on 04/24/2019  Component Date Value Ref Range Status  . Uric Acid, Serum 04/24/2019 4.6  2.5 - 7.1 mg/dL Final   Performed at [facility] [place] [facility], [address] [place], [place] [address]  . LDH 04/24/2019 149  98 - 192 U/L Final   Performed at [facility], [address], [place],  [address]  . Sodium 04/24/2019 138  135 - 145 mmol/L Final  .  Potassium 04/24/2019 4.0  3.5 - 5.1 mmol/L Final  . Chloride 04/24/2019 102  98 - 111 mmol/L Final  . CO2 04/24/2019 23  22 - 32 mmol/L Final  . Glucose, Bld 04/24/2019 92  70 - 99 mg/dL Final   Glucose reference range applies only to samples taken after fasting for at least 8 hours.  . BUN 04/24/2019 10  8 - 23 mg/dL Final  . Creatinine, Ser 04/24/2019 0.85  0.44 - 1.00 mg/dL Final  . Calcium 04/24/2019 9.4  8.9 - 10.3 mg/dL Final  . Total Protein 04/24/2019 6.9  6.5 - 8.1 g/dL Final  . Albumin 04/24/2019 4.8  3.5 - 5.0 g/dL Final  . AST 04/24/2019 22  15 - 41 U/L Final  . ALT 04/24/2019 16  0 - 44 U/L Final  . Alkaline Phosphatase 04/24/2019 87  38 - 126 U/L Final  . Total Bilirubin 04/24/2019 1.1  0.3 - 1.2 mg/dL Final  . GFR calc non Af Amer 04/24/2019 >60  >60 mL/min Final  . GFR calc Af Amer 04/24/2019 >60  >60 mL/min Final  . Anion gap 04/24/2019 13  5 - 15 Final   Performed at [facility], [address], [place], [place] [address]  . WBC 04/24/2019 107.2* 4.0 - 10.5 K/uL Final   Comment: This critical result has verified  and been called to [place] by Volney Presser on 03 02 2021 at 1039, and has been read back.  This critical result has verified and been called to [place] by Volney Presser on 03 02 2021 at 1042, and has been read back.    Marland Kitchen RBC 04/24/2019 4.41  3.87 - 5.11 MIL/uL Final  . Hemoglobin 04/24/2019 14.4  12.0 - 15.0 g/dL Final  . HCT 04/24/2019 44.5  36.0 - 46.0 % Final  . MCV 04/24/2019 100.9* 80.0 - 100.0 fL Final  . MCH 04/24/2019 32.7  26.0 - 34.0 pg Final  . MCHC 04/24/2019 32.4  30.0 - 36.0 g/dL Final  . RDW 04/24/2019 13.9  11.5 - 15.5 % Final  . Platelets 04/24/2019 161  150 - 400 K/uL Final  . nRBC 04/24/2019 0.0  0.0 - 0.2 % Final  . Neutrophils Relative % 04/24/2019 4  % Final  . Neutro Abs 04/24/2019 3.8  1.7 - 7.7 K/uL Final  . Lymphocytes Relative 04/24/2019 93  % Final  . Lymphs Abs 04/24/2019 99.5* 0.7 - 4.0 K/uL Final  . Monocytes Relative 04/24/2019 3  % Final  . Monocytes Absolute 04/24/2019 3.3* 0.1 - 1.0 K/uL Final  . Eosinophils Relative 04/24/2019 0  % Final  . Eosinophils Absolute 04/24/2019 0.3  0.0 - 0.5 K/uL Final  . Basophils Relative 04/24/2019 0  % Final  . Basophils Absolute 04/24/2019 0.1  0.0 - 0.1 K/uL Final  . Immature Granulocytes 04/24/2019 0  % Final  . Abs Immature Granulocytes 04/24/2019 0.16* 0.00 - 0.07 K/uL Final   Performed at [facility], [address] [place], [place] [address]    Assessment:  Katherine [name] is a 79 y.o. female with chronic lymphocytic leukemia(CLL). WBC has ranged between 22,000 - 101,7000 since 05/2011.  She has received Rituxanx 2 four week cycles (06/27/2015 and 05/27/2017). Hepatitis B surface antigen and hepatitis B surface antibody were negative on 07/04/2015.  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  CCND1, ATM, chromosome 12, and  TP53 were normal.    Flow cytometry on 04/09/2019  confirmed chonic lymphocytic leukemia, negative for CD38.  There was a CD5 and CD23 positive monoclonal B cell population with lambda light chain restriction, negative for FMC7 and CD38, representing  88% of leukocytes, >5,000/uL. There was no loss of, or aberrant expression of, the pan T cell  antigens to  suggest a neoplastic T cell process. CD4:CD8 ratio 2.0  No circulating blasts were detected. There was no immunophenotypic evidence of abnormal myeloid maturation.  IGH/BCL2 by FISH is pending.  She has a 35 pack year smoking history. She is in the low dose chest CT program. Low dose chest CTon 02/02/2018 revealed a new endobronchial lesionin the segmental bronchus to the medial segment of the right middle lobe. This may simply reflect an area of retained secretions, however, the possibility of an endobronchial neoplasm should be considered. Lung-RADS 4AS, suspicious.Low dose chest CTon 05/13/2020revealed Lung-RADS 2, benign appearance or behavior. Prior right middle lobe endobronchial lesion/mucous plugging wasno longer visualized. New mucous plugging in the right lower lobe.  Symptomatically, she feels "pretty good".  Exam reveals no adenopathy or hepatosplenomegaly.  She has diarrhea since beginning ibrutinib.   Plan: 1.   Labs today:  CBC with diff, CMP, LDH, uric acid. 2. Chronic lymphocytic leukemia (CLL) Clinically denies any fevers or weight loss. Exam reveals no adenopathy or hepatosplenomegaly. WBC has increased from 75,600 to 109,000 in past 3 months.   CLL doubling time is approximately 6 months.   WBC is 206.8 today.  Continue ibrutinib as prescribed.  Consulted Dr. Grayland Ormond who recommends she continue ibrutinib with short term follow-up.   Per up-to-date, we should monitor her labs monthly.  Lymphocytosis greater than 50% increase from  baseline may occur upon therapy initiation, generally within the first few weeks of  therapy.  The increase in  lymphocytes is temporary and resolved by median of 14 weeks for CLL patients.  Patients who develop  lymphocytosis greater than 400,000 have developed intracranial hemorrhage, lethargy, headache and gait  instability.  Monitor for leukostasis particular in patients experience a rapid increase in lymphocytes.  Will have patient return to clinic in 1 week for labs only to reassess.  No evidence of TLS. Uric acid level is normal (4.8).  3. Health maintenance Patient is in the lung cancer screening program.             Low-dose chest CT on05/13/2020 was benign.             Next annual low dose chest CT due on 07/05/2019.             Bilateral mammogram on 01/15/2019 revealed no evidence of malignancy.    Continue yearly mammograms. 4. Diarrhea  Fairly common with ibrutinib.  Instructed her to remain hydrated.   Continue Kaopectate for now.  Can try Imodium if symptoms persist or increase in frequency. 5.  RTC in 1 week for lab only (CBC with diff, BMP, uric acid) 6.  RTC 3 weeks for MD assessment and labs (CBC with diff, BMP, uric acid).  I discussed the assessment and treatment plan with the patient.  The patient was provided an opportunity to ask questions and all were answered.  The patient agreed with the plan and demonstrated an understanding of the instructions.  The patient was advised to call back if the symptoms worsen or if the condition fails to improve as anticipated.  Greater than 50% was spent in counseling and coordination of  care with this patient including but not limited to discussion of the relevant topics above (See A&P) including, but not limited to diagnosis and management of acute and chronic medical conditions.  Faythe Casa, NP 04/30/2019 12:36 PM

## 2019-04-30 NOTE — Progress Notes (Signed)
No new changes noted today 

## 2019-05-01 ENCOUNTER — Encounter: Payer: Self-pay | Admitting: Hematology and Oncology

## 2019-05-07 ENCOUNTER — Telehealth: Payer: Self-pay | Admitting: *Deleted

## 2019-05-07 ENCOUNTER — Other Ambulatory Visit: Payer: Self-pay

## 2019-05-07 ENCOUNTER — Inpatient Hospital Stay: Payer: Medicare HMO

## 2019-05-07 DIAGNOSIS — C9110 Chronic lymphocytic leukemia of B-cell type not having achieved remission: Secondary | ICD-10-CM | POA: Diagnosis not present

## 2019-05-07 DIAGNOSIS — Z803 Family history of malignant neoplasm of breast: Secondary | ICD-10-CM | POA: Diagnosis not present

## 2019-05-07 DIAGNOSIS — R69 Illness, unspecified: Secondary | ICD-10-CM | POA: Diagnosis not present

## 2019-05-07 LAB — BASIC METABOLIC PANEL
Anion gap: 6 (ref 5–15)
BUN: 10 mg/dL (ref 8–23)
CO2: 30 mmol/L (ref 22–32)
Calcium: 9 mg/dL (ref 8.9–10.3)
Chloride: 102 mmol/L (ref 98–111)
Creatinine, Ser: 0.83 mg/dL (ref 0.44–1.00)
GFR calc Af Amer: >60 mL/min (ref >60)
GFR calc non Af Amer: >60 mL/min (ref >60)
Glucose, Bld: 92 mg/dL (ref 70–99)
Potassium: 3.7 mmol/L (ref 3.5–5.1)
Sodium: 138 mmol/L (ref 135–145)

## 2019-05-07 LAB — CBC WITH DIFFERENTIAL/PLATELET
Abs Immature Granulocytes: 0.24 10*3/uL — ABNORMAL HIGH (ref 0.00–0.07)
Basophils Absolute: 0.1 10*3/uL (ref 0.0–0.1)
Basophils Relative: 0 %
Eosinophils Absolute: 0.2 10*3/uL (ref 0.0–0.5)
Eosinophils Relative: 0 %
HCT: 43.7 % (ref 36.0–46.0)
Hemoglobin: 13.6 g/dL (ref 12.0–15.0)
Immature Granulocytes: 0 %
Lymphocytes Relative: 97 %
Lymphs Abs: 179.8 10*3/uL — ABNORMAL HIGH (ref 0.7–4.0)
MCH: 32.5 pg (ref 26.0–34.0)
MCHC: 31.1 g/dL (ref 30.0–36.0)
MCV: 104.3 fL — ABNORMAL HIGH (ref 80.0–100.0)
Monocytes Absolute: 0.8 10*3/uL (ref 0.1–1.0)
Monocytes Relative: 1 %
Neutro Abs: 3.6 10*3/uL (ref 1.7–7.7)
Neutrophils Relative %: 2 %
Platelets: 124 10*3/uL — ABNORMAL LOW (ref 150–400)
RBC: 4.19 MIL/uL (ref 3.87–5.11)
RDW: 14.3 % (ref 11.5–15.5)
WBC: 184.7 10*3/uL (ref 4.0–10.5)
nRBC: 0 % (ref 0.0–0.2)

## 2019-05-07 LAB — URIC ACID: Uric Acid, Serum: 4.4 mg/dL (ref 2.5–7.1)

## 2019-05-07 NOTE — Telephone Encounter (Signed)
Lab called a WBC level of 184,700.  MD team notified.

## 2019-05-15 ENCOUNTER — Other Ambulatory Visit: Payer: Self-pay

## 2019-05-15 ENCOUNTER — Encounter: Payer: Self-pay | Admitting: Emergency Medicine

## 2019-05-15 ENCOUNTER — Ambulatory Visit: Payer: Self-pay

## 2019-05-15 ENCOUNTER — Ambulatory Visit (INDEPENDENT_AMBULATORY_CARE_PROVIDER_SITE_OTHER): Payer: Medicare HMO

## 2019-05-15 ENCOUNTER — Ambulatory Visit
Admission: EM | Admit: 2019-05-15 | Discharge: 2019-05-15 | Disposition: A | Discharge status: Home / Self Care | Payer: Medicare HMO | Attending: Family Medicine | Admitting: Family Medicine

## 2019-05-15 DIAGNOSIS — R05 Cough: Secondary | ICD-10-CM | POA: Diagnosis not present

## 2019-05-15 DIAGNOSIS — R042 Hemoptysis: Secondary | ICD-10-CM | POA: Diagnosis not present

## 2019-05-15 DIAGNOSIS — R69 Illness, unspecified: Secondary | ICD-10-CM | POA: Diagnosis not present

## 2019-05-15 DIAGNOSIS — F17210 Nicotine dependence, cigarettes, uncomplicated: Secondary | ICD-10-CM

## 2019-05-15 DIAGNOSIS — J439 Emphysema, unspecified: Secondary | ICD-10-CM | POA: Diagnosis not present

## 2019-05-15 LAB — CBC WITH DIFFERENTIAL/PLATELET
Abs Immature Granulocytes: 0.24 10*3/uL — ABNORMAL HIGH (ref 0.00–0.07)
Basophils Absolute: 0.1 10*3/uL (ref 0.0–0.1)
Basophils Relative: 0 %
Eosinophils Absolute: 0.2 10*3/uL (ref 0.0–0.5)
Eosinophils Relative: 0 %
HCT: 42.5 % (ref 36.0–46.0)
Hemoglobin: 13 g/dL (ref 12.0–15.0)
Immature Granulocytes: 0 %
Lymphocytes Relative: 97 %
Lymphs Abs: 204.6 10*3/uL — ABNORMAL HIGH (ref 0.7–4.0)
MCH: 31.7 pg (ref 26.0–34.0)
MCHC: 30.6 g/dL (ref 30.0–36.0)
MCV: 103.7 fL — ABNORMAL HIGH (ref 80.0–100.0)
Monocytes Absolute: 1.1 10*3/uL — ABNORMAL HIGH (ref 0.1–1.0)
Monocytes Relative: 1 %
Neutro Abs: 3.4 10*3/uL (ref 1.7–7.7)
Neutrophils Relative %: 2 %
Platelets: 152 10*3/uL (ref 150–400)
RBC: 4.1 MIL/uL (ref 3.87–5.11)
RDW: 15.2 % (ref 11.5–15.5)
WBC: 209.5 10*3/uL (ref 4.0–10.5)
nRBC: 0 % (ref 0.0–0.2)

## 2019-05-15 LAB — COMPREHENSIVE METABOLIC PANEL
ALT: 11 U/L (ref 0–44)
AST: 18 U/L (ref 15–41)
Albumin: 4.5 g/dL (ref 3.5–5.0)
Alkaline Phosphatase: 79 U/L (ref 38–126)
Anion gap: 13 (ref 5–15)
BUN: 9 mg/dL (ref 8–23)
CO2: 21 mmol/L — ABNORMAL LOW (ref 22–32)
Calcium: 8.7 mg/dL — ABNORMAL LOW (ref 8.9–10.3)
Chloride: 104 mmol/L (ref 98–111)
Creatinine, Ser: 0.76 mg/dL (ref 0.44–1.00)
GFR calc Af Amer: >60 mL/min (ref >60)
GFR calc non Af Amer: >60 mL/min (ref >60)
Glucose, Bld: 91 mg/dL (ref 70–99)
Potassium: 4 mmol/L (ref 3.5–5.1)
Sodium: 138 mmol/L (ref 135–145)
Total Bilirubin: 1 mg/dL (ref 0.3–1.2)
Total Protein: 6.2 g/dL — ABNORMAL LOW (ref 6.5–8.1)

## 2019-05-15 LAB — PROTIME-INR
INR: 0.9 (ref 0.8–1.2)
Prothrombin Time: 12.2 seconds (ref 11.4–15.2)

## 2019-05-15 MED FILL — IMBRUVICA 420 MG TAB: 420 | 28 days supply | Qty: 28 | Fill #1

## 2019-05-15 NOTE — ED Triage Notes (Signed)
Patient states she had the COVID vaccine on Friday (1st dose/Pfizer) and she has been coughing up blood that started on Sunday. She states every time she blows her nose it causes her to cough and she has blood in her sputum when she coughs. She states she just started Ibrutinib 3 weeks ago.

## 2019-05-15 NOTE — Discharge Instructions (Signed)
I will call with the results.  Take care  Dr. Ching Rabideau  

## 2019-05-15 NOTE — Telephone Encounter (Addendum)
Patient called and states that she has been coughing up blood since Sunday.  She states that she has allergies and thought that the cough was due to pollens. She states that her sputum is yellow in color and streaked with blood.  She states it comes up with each cough but that her cough is not frequent, 5-6 times today. She sounds like she has nasal congestion but has no complaints of stuffyness. Her breathing is fine per patient. She has no fever but has chronic lymphocytic leukemia. She states that she has had her first COVID-19 vaccine on 05/10/19.  No other symptoms. Per protocol patient should be seen within 24 hours. She will go to UC in [place]. No appointments available at office. Reason for Disposition . [1] Has underlying lung disease (e.g., COPD, chronic bronchitis or emphysema) AND    [2] sputum has turned yellow or green in color  Answer Assessment - Initial Assessment Questions 1. ONSET: "When did you start coughing up blood?"     sunday 2. SEVERITY: "How many times?" "How much blood?" (e.g., flecks, streaks, tablespoons, etc)    5-6 time streaks 3. COUGHING SPASMS: "Did the blood appear after a coughing spell?"       During coughing just ocassoinal 4. RESPIRATORY DISTRESS: "Describe your breathing."     Breathing is fine 5. FEVER: "Do you have a fever?" If so, ask: "What is your temperature, how was it measured, and when did it start?"    No 97.8 6. SPUTUM: "Describe the color of your sputum" (clear, white, yellow, green), "Has there been any change recently?"   Yellow  7. CARDIAC HISTORY: "Do you have any history of heart disease?" (e.g., heart attack, congestive heart failure)    no 8. LUNG HISTORY: "Do you have any history of lung disease?"  (e.g., pulmonary embolus, asthma, emphysema)    no 9. PE RISK FACTORS: "Do you have a history of blood clots?" (or: recent major surgery, recent prolonged travel, bedridden)    no 10. OTHER SYMPTOMS: "Do you have any other symptoms?" (e.g.,  nosebleed, chest pain, abdominal pain, vomiting)     No 11. PREGNANCY: "Is there any chance you are pregnant?" "When was your last menstrual period?"      No 12. TRAVEL: "Have you traveled out of the country in the last month?" (e.g., travel history, exposures)       No  Protocols used: COUGHING UP BLOOD-A-AH

## 2019-05-15 NOTE — ED Provider Notes (Signed)
[facility]    CSN: [id number] Arrival date & time: 05/15/19  1718  History   Chief Complaint Chief Complaint  Patient presents with  . Hemoptysis   HPI  79 year old female presents with blood-tinged sputum.  Patient reports that she got her first dose of the [place] vaccine on Friday.  She states that she has been coughing some.  She is a current smoker.  She states that on Sunday she noted some blood-tinged sputum.  She states that there is no frank mopped assist.  Just blood-tinged sputum.  She recently started ibrutinib 3 weeks ago.  She is concerned that this may be related to the vaccine or the medication.  Denies shortness of breath.  No other new symptoms.  She is otherwise feeling well.  No other complaints or concerns at this time.  Past Medical History:  Diagnosis Date  . Allergy   . Anxiety   . Depression   . GERD (gastroesophageal reflux disease)   . Hemorrhoids   . Leukemia, lymphoid (HCC)    CLL  . Lobar pneumonia (HCC)   . Osteopenia   . Personal history of chemotherapy     Patient Active Problem List   Diagnosis Date Noted  . GERD (gastroesophageal reflux disease)   . Cellulitis of buttock 08/22/2018  . Allergic rhinitis 07/18/2018  . Goals of care, counseling/discussion 07/05/2018  . Nicotine dependence, cigarettes, uncomplicated 05/01/2018  . Emphysema of lung (HCC) 02/05/2018  . Advanced care planning/counseling discussion 01/09/2018  . Hemorrhoids, internal 08/09/2017  . Anxiety 01/04/2017  . Hypercholesterolemia 01/08/2016  . Atherosclerosis of aorta (HCC) 01/07/2016  . History of colonic polyps 08/14/2015  . CLL (chronic lymphocytic leukemia) (HCC) 05/28/2015  . Senile purpura (HCC) 01/06/2015  . Essential hypertension 01/06/2015  . Depression 01/06/2015    Past Surgical History:  Procedure Laterality Date  . ABDOMINAL HYSTERECTOMY    . APPENDECTOMY    . BREAST BIOPSY Left    bx x 3-neg  . COLON SURGERY     sigmoid  resection  . COLONOSCOPY     20 07, 2012  . COLONOSCOPY WITH PROPOFOL N/A 09/17/2015   Procedure: COLONOSCOPY WITH PROPOFOL;  Surgeon: Robert Bellow, MD;  Location: [facility] ENDOSCOPY;  Service: Endoscopy;  Laterality: N/A;  . OOPHORECTOMY    . SPINE SURGERY     L4-5    OB History   No obstetric history on file.      Home Medications    Prior to Admission medications   Medication Sig Start Date End Date Taking? Authorizing Provider  aspirin EC 81 MG tablet Take 81 mg by mouth every other day.    Yes [provider]  atorvastatin (LIPITOR) 10 MG tablet Take 1 tablet (10 mg total) by mouth daily. 01/11/19  Yes Volney American, PA-C  benazepril (LOTENSIN) 40 MG tablet Take 1 tablet (40 mg total) by mouth daily. 01/11/19  Yes Volney American, PA-C  diphenhydrAMINE (BENADRYL) 25 MG tablet Take 25 mg by mouth at bedtime as needed.   Yes [provider]  Ibrutinib 420 MG TABS Take 420 mg by mouth daily. 04/09/19  Yes Corcoran, Drue Second, MD  LORazepam (ATIVAN) 1 MG tablet Take 0.5 tablets (0.5 mg total) by mouth 2 (two) times daily as needed for anxiety. 01/11/19  Yes Volney American, PA-C  Multiple Vitamins-Minerals (MULTIVITAMIN WITH MINERALS) tablet Take 1 tablet by mouth daily.   Yes [provider]  omeprazole (PRILOSEC) 20 MG capsule Take 1  capsule (20 mg total) by mouth daily as needed. 01/11/19  Yes Volney American, PA-C  PARoxetine (PAXIL) 30 MG tablet Take 1 tablet (30 mg total) by mouth daily. 01/11/19  Yes Volney American, PA-C  Triamcinolone Acetonide (NASACORT AQ NA) Place 2 Doses into the nose daily as needed.    Yes [provider]  predniSONE (DELTASONE) 10 MG tablet 6 tabs today and tomorrow, 5 tabs the next 2 days, decrease by 1 every other day until gone Patient not taking: Reported on 04/24/2019 03/21/19   Valerie Roys, DO    Family History Family History  Problem Relation Age of Onset  .  Osteoporosis Mother   . Hypertension Father   . Heart attack Father   . Stroke Maternal Grandfather   . Breast cancer Sister 1    Social History Social History   Tobacco Use  . Smoking status: Current Every Day Smoker    Packs/day: 0.25    Years: 35.00    Pack years: 8.75    Types: Cigarettes  . Smokeless tobacco: Never Used  Substance Use Topics  . Alcohol use: No    Alcohol/week: 0.0 standard drinks  . Drug use: No     Allergies   Augmentin [amoxicillin-pot clavulanate] and Biaxin [clarithromycin]   Review of Systems Review of Systems  Constitutional: Negative for fever.  Respiratory: Positive for cough. Negative for shortness of breath.    Physical Exam Triage Vital Signs ED Triage Vitals  Enc Vitals Group     BP 05/15/19 1738 138/79     Pulse Rate 05/15/19 1738 77     Resp 05/15/19 1738 18     Temp 05/15/19 1738 98.6 F (37 C)     Temp Source 05/15/19 1738 Oral     SpO2 05/15/19 1738 95 %     Weight 05/15/19 1737 153 lb (69.4 kg)     Height 05/15/19 1737 5\' 6"  (1.676 m)     Head Circumference --      Peak Flow --      Pain Score 05/15/19 1737 0     Pain Loc --      Pain Edu? --      Excl. in [place]? --    Updated Vital Signs BP 138/79 (BP Location: Left Arm)   Pulse 77   Temp 98.6 F (37 C) (Oral)   Resp 18   Ht 5\' 6"  (1.676 m)   Wt 69.4 kg   SpO2 95%   BMI 24.69 kg/m   Visual Acuity Right Eye Distance:   Left Eye Distance:   Bilateral Distance:    Right Eye Near:   Left Eye Near:    Bilateral Near:     Physical Exam Vitals and nursing note reviewed.  Constitutional:      General: She is not in acute distress.    Appearance: Normal appearance. She is not ill-appearing.  HENT:     Head: Normocephalic and atraumatic.     Nose: Nose normal.     Mouth/Throat:     Pharynx: Oropharynx is clear. No oropharyngeal exudate or posterior oropharyngeal erythema.  Eyes:     General:        Right eye: No discharge.        Left eye: No  discharge.     Conjunctiva/sclera: Conjunctivae normal.  Cardiovascular:     Rate and Rhythm: Normal rate and regular rhythm.  Pulmonary:     Effort: Pulmonary effort is normal.  Breath sounds: No wheezing or rales.  Neurological:     Mental Status: She is alert.  Psychiatric:        Mood and Affect: Mood normal.        Behavior: Behavior normal.    UC Treatments / Results  Labs (all labs ordered are listed, but only abnormal results are displayed) Labs Reviewed  CBC WITH DIFFERENTIAL/PLATELET - Abnormal; Notable for the following components:      Result Value   WBC 209.5 (*)    MCV 103.7 (*)    All other components within normal limits  COMPREHENSIVE METABOLIC PANEL - Abnormal; Notable for the following components:   CO2 21 (*)    Calcium 8.7 (*)    Total Protein 6.2 (*)    All other components within normal limits  PROTIME-INR    EKG   Radiology DG Chest 2 View  Result Date: 05/15/2019 CLINICAL DATA:  79 year old female with blood tinged sputum. EXAM: CHEST - 2 VIEW COMPARISON:  Chest radiograph dated 01/07/2016. FINDINGS: Background of emphysema. No focal consolidation, pleural effusion, pneumothorax. The cardiac silhouette is within limits. No acute osseous pathology. Degenerative changes of the spine. Atherosclerotic calcification of the aorta. IMPRESSION: No acute cardiopulmonary process. Emphysema. Electronically Signed   By: Anner Crete M.D.   On: 05/15/2019 18:30    Procedures Procedures (including critical care time)  Medications Ordered in UC Medications - No data to display  Initial Impression / Assessment and Plan / UC Course  I have reviewed the triage vital signs and the nursing notes.  Pertinent labs & imaging results that were available during my care of the patient were reviewed by me and considered in my medical decision making (see chart for details).    79 year old female presents with blood in sputum.  Laboratory studies essentially at  baseline.  Chest x-ray negative for acute findings.  Do not believe that this is related to her Covid vaccine.  Could be related to ibrutinib.  Follow-up with oncology.  Supportive care.  Final Clinical Impressions(s) / UC Diagnoses   Final diagnoses:  Blood-tinged sputum     Discharge Instructions     I will call with the results.  Take care  Dr. Lacinda Axon    ED Prescriptions    None     PDMP not reviewed this encounter.   Coral Spikes, [place] 05/15/19 [phone]

## 2019-05-17 NOTE — Progress Notes (Signed)
[facility]  [address] [place], [place] [address] Phone: [phone]  Fax: [phone]   Clinic Day:  05/21/2019  Referring physician: [name], MD  Chief Complaint: Katherine [name] is a 79 y.o. female with chronic lymphocytic leukemia (CLL) who is seen for 4 week assessment.  HPI: The patient was last seen in the medical oncology clinic on 04/24/2019. At that time, she felt "pretty good". She had chronic sweats. Exam revealed no adenopathy or hepatosplenomegaly. She had bruising (upper > lower extremities) on aspirin. Hematocrit was 44.5, hemoglobin 14.4, platelets 161,000, WBC 107,200 (ANC 3,800; ALC 99,500).  CMP was normal. Uric acid was 4.6 and LDH 149.  Ibrutinib was initiated.  Patient was seen in the medical oncology clinic on 04/30/2019 by Faythe Casa, NP. At that time, she felt "pretty good".  Exam revealed no adenopathy or hepatosplenomegaly. She had diarrhea since beginning ibrutinib. Patient was instructed to remain hydrated, continue Kaopectate and try Imodium if symptoms persisted.   Patient was seen by Dr. Thersa Salt at [facility] on 05/15/2019 for hemoptysis. Patient reported that she received her first dose of the [place] COVID-19 vaccine on 05/11/2019. She had been coughing some. She is a current smoker. She stated that on 05/13/2019 she had some blood-tinged sputum. There was no frank hemoptysis.  She was concerned symptoms were related to the vaccine or ibrutinib (started 3 weeks ago). She denied shortness of breath. She was otherwise feeling well.  INR was 0.9.   CXR on 05/15/2019 was negative for acute findings. Symptoms were felt not related to the COVD-19 vaccine but possibly related to ibrutinib. Patient was recommended to follow up with oncology.   Labs followed: 04/30/2019: Hematocrit 43.5, hemoglobin 13.5, platelets 140,000, WBC 206,800 (ANC 3,700; ALC 199,200).  05/07/2019: Hematocrit 43.7, hemoglobin  13.6, platelets 124,000, WBC 184,700 (ANC 3,600; ALC 179,800).  05/15/2019  Hematocrit 42.5, hemoglobin 13.0, platelets 152,000, WBC 209,500 (ANC 3,400; ALC 204,600).  During the interim, she has felt tired. She is "ready to go to bed". Patient reports diarrhea after starting Ibrutinib 420 mg daily. She will take her last pill from the current bottle on 05/22/2019. She got her new package of medication on 05/17/2019. She notes having diverticulitis. She notes having a sensitive GI tract. She is on a small amount of anti-diarrhea tablets. She notes having streaks of blood in her sputum on 05/15/2019 that has since resolved.   She denies shortness of breath on exertion. She notes back pain and swelling in her ankles and feet. She notes bruising on her legs and joint pain. She feels like all of her symptoms are about the same.   Patient's daughter would like to know her mother's blood type so she can better take care of herself.    Past Medical History:  Diagnosis Date  . Allergy   . Anxiety   . Depression   . GERD (gastroesophageal reflux disease)   . Hemorrhoids   . Leukemia, lymphoid ([place])    CLL  . Lobar pneumonia ([place])   . Osteopenia   . Personal history of chemotherapy     Past Surgical History:  Procedure Laterality Date  . ABDOMINAL HYSTERECTOMY    . APPENDECTOMY    . BREAST BIOPSY Left    bx x 3-neg  . COLON SURGERY     sigmoid resection  . COLONOSCOPY     2007, 2012  . COLONOSCOPY WITH PROPOFOL N/A 09/17/2015   Procedure: COLONOSCOPY WITH PROPOFOL;  Surgeon: Robert Bellow, MD;  Location: [facility] END[facility]COPY;  Service: Endoscopy;  Laterality: N/A;  . OOPHORECTOMY    . SPINE SURGERY     L4-5    Family History  Problem Relation Age of Onset  . Osteoporosis Mother   . Hypertension Father   . Heart attack Father   . Stroke Maternal Grandfather   . Breast cancer Sister 35    Social History:  reports that she has been smoking cigarettes. She has a 8.75 pack-year  smoking history. She has never used smokeless tobacco. She reports that she does not drink alcohol or use drugs. Currently smokes 5 cigarettes daily, but is working toward quitting. She previously smoked a pack a day for a "longtime".She lives in [place], [place].The patient is alone today.  Allergies:  Allergies  Allergen Reactions  . Augmentin [Amoxicillin-Pot Clavulanate] Swelling    tongue  . Biaxin [Clarithromycin] Swelling and Rash    tongue    Current Medications: Current Outpatient Medications  Medication Sig Dispense Refill  . aspirin EC 81 MG tablet Take 81 mg by mouth every other day.     Marland Kitchen atorvastatin (LIPITOR) 10 MG tablet Take 1 tablet (10 mg total) by mouth daily. 90 tablet 1  . benazepril (LOTENSIN) 40 MG tablet Take 1 tablet (40 mg total) by mouth daily. 90 tablet 1  . diphenhydrAMINE (BENADRYL) 25 MG tablet Take 25 mg by mouth at bedtime as needed.    . Ibrutinib 420 MG TABS Take 420 mg by mouth daily. 30 tablet 1  . LORazepam (ATIVAN) 1 MG tablet Take 0.5 tablets (0.5 mg total) by mouth 2 (two) times daily as needed for anxiety. 30 tablet 5  . Multiple Vitamins-Minerals (MULTIVITAMIN WITH MINERALS) tablet Take 1 tablet by mouth daily.    Marland Kitchen omeprazole (PRILOSEC) 20 MG capsule Take 1 capsule (20 mg total) by mouth daily as needed. 90 capsule 1  . PARoxetine (PAXIL) 30 MG tablet Take 1 tablet (30 mg total) by mouth daily. 90 tablet 1  . Triamcinolone Acetonide (NASACORT AQ NA) Place 2 Doses into the nose daily as needed.     . predniSONE (DELTASONE) 10 MG tablet 6 tabs today and tomorrow, 5 tabs the next 2 days, decrease by 1 every other day until gone (Patient not taking: Reported on 04/24/2019) 42 tablet 0   No current facility-administered medications for this visit.    Review of Systems  Constitutional: Negative for chills, diaphoresis, fever, malaise/fatigue and weight loss (up 3 lbs).       Feels "tired". Ready to go to bed.  HENT: Negative.  Negative for  congestion, ear pain, hearing loss, nosebleeds, sinus pain and sore throat.   Eyes: Negative.  Negative for blurred vision, double vision, photophobia and pain.  Respiratory: Negative.  Negative for cough, hemoptysis, sputum production and shortness of breath.   Cardiovascular: Positive for leg swelling (ankles and feet). Negative for chest pain, palpitations, orthopnea and PND.  Gastrointestinal: Positive for diarrhea (after starting Ibrutinib; on anti diarrhea tablets). Negative for abdominal pain, blood in stool, constipation, heartburn, melena, nausea and vomiting.       Diverticulitis. Sensitive GI tract.  Genitourinary: Negative.  Negative for dysuria, frequency, hematuria and urgency.  Musculoskeletal: Positive for joint pain (arthritis). Negative for back pain, myalgias and neck pain.  Skin: Negative.  Negative for itching and rash.  Neurological: Negative for dizziness, tingling, speech change, focal weakness, weakness and headaches.  Endo/Heme/Allergies: Negative for environmental allergies. Bruises/bleeds easily (bruising on legs).  Psychiatric/Behavioral: Negative for depression and memory loss. The patient is not nervous/anxious and does not have insomnia.   All other systems reviewed and are negative.  Performance status (ECOG): 1  Vitals Blood pressure (!) 150/74, pulse 67, temperature 97.7 F (36.5 C), temperature source Tympanic, resp. rate 18, height '5\' 6"'$  (1.676 m), weight 157 lb 8.3 oz (71.5 kg), SpO2 100 %.   Physical Exam  Constitutional: She is oriented to person, place, and time. She appears well-developed and well-nourished. No distress.  HENT:  Head: Normocephalic and atraumatic.  Mouth/Throat: Oropharynx is clear and moist. No oropharyngeal exudate.  Short gray hair.  Mask.  Eyes: Pupils are equal, round, and reactive to light. Conjunctivae and EOM are normal. No scleral icterus.  Glasses.  Brown eyes.  Neck: No JVD present.  Cardiovascular: Normal rate,  regular rhythm and normal heart sounds. Exam reveals no gallop and no friction rub.  No murmur heard. Pulmonary/Chest: Effort normal and breath sounds normal. No respiratory distress. She has no wheezes. She has no rales.  Abdominal: Soft. Bowel sounds are normal. She exhibits no distension and no mass. There is no abdominal tenderness. There is no rebound and no guarding.  Musculoskeletal:        General: Tenderness and edema (chronic bilateral ankle: 2-3+ left and 1-2+ right) present. Normal range of motion.     Cervical back: Normal range of motion and neck supple.  Lymphadenopathy:       Head (right side): No preauricular, no posterior auricular and no occipital adenopathy present.       Head (left side): No preauricular, no posterior auricular and no occipital adenopathy present.    She has no cervical adenopathy.    She has no axillary adenopathy.       Right: No supraclavicular adenopathy present.       Left: No supraclavicular adenopathy present.  Neurological: She is alert and oriented to person, place, and time.  Skin: Skin is warm and dry. Ecchymosis (mild- arms and legs) noted. No rash noted. She is not diaphoretic. No erythema. No pallor.  Psychiatric: She has a normal mood and affect. Her behavior is normal. Judgment and thought content normal.  Nursing note and vitals reviewed.   Appointment on 05/21/2019  Component Date Value Ref Range Status  . Uric Acid, Serum 05/21/2019 4.5  2.5 - 7.1 mg/dL Final   Performed at [facility], [address], [place], [place] [address]  . Sodium 05/21/2019 137  135 - 145 mmol/L Final  . Potassium 05/21/2019 4.0  3.5 - 5.1 mmol/L Final  . Chloride 05/21/2019 104  98 - 111 mmol/L Final  . CO2 05/21/2019 24  22 - 32 mmol/L Final  . Glucose, Bld 05/21/2019 90  70 - 99 mg/dL Final   Glucose reference range applies only to samples taken after fasting for at least 8 hours.  . BUN 05/21/2019 8  8 - 23 mg/dL Final  . Creatinine,  Ser 05/21/2019 0.75  0.44 - 1.00 mg/dL Final  . Calcium 05/21/2019 9.1  8.9 - 10.3 mg/dL Final  . GFR calc non Af Amer 05/21/2019 >60  >60 mL/min Final  . GFR calc Af Amer 05/21/2019 >60  >60 mL/min Final  . Anion gap 05/21/2019 9  5 - 15 Final   Performed at [facility], [address], [place], [place] [address]    Assessment:  [place] is a 79 y.o. female with chronic lymphocytic leukemia(CLL). WBC has ranged  between 22,000 - 101,7000 since 05/2011.  She has received Rituxanx 2 four week cycles (06/27/2015 and 05/27/2017). Hepatitis B surface antigen and hepatitis B surface antibody were negative on 07/04/2015.  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  CCND1, ATM, chromosome 12, and TP53 were normal.   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  There was a CD5 and CD23 positive monoclonal B cell population with lambda light chain restriction, negative for FMC7 and CD38, representing  88% of leukocytes, >5,000/uL. There was no loss of, or aberrant expression of, the pan T cell  antigens to  suggest a neoplastic T cell process. CD4:CD8 ratio 2.0  No circulating blasts were detected. There was no immunophenotypic evidence of abnormal myeloid maturation.  IGH/BCL2 by FISH revealed 86.5% of nuclei positive for 3 IgH signals.  There were 2BCL2 signals. The IGH/BCL2 fusion signals associated with follicular lymphoma, and to a lesser extent diffuse large cell  lymphoma, were not observed. The presence of 3 IGH  signals suggests an IGH rearrangement with a gene other than BCL2.  Ibrutinib was started on 04/24/2019.  WBC has been followed: 107,200 on 04/24/2019, 206,800 on 04/30/2019, 184,700 on 05/07/2019, 209,500 on 05/15/2019 and 237,000 on 05/21/2019.  She has a 35 pack year smoking history. She is in the low dose chest CT  program. Low dose chest CTon 02/02/2018 revealed a new endobronchial lesionin the segmental bronchus to the medial segment of the right middle lobe. This may simply reflect an area of retained secretions, however, the possibility of an endobronchial neoplasm should be considered. Lung-RADS 4AS, suspicious.Low dose chest CTon 05/13/2020revealed Lung-RADS 2, benign appearance or behavior. Prior right middle lobe endobronchial lesion/mucous plugging wasno longer visualized. New mucous plugging in the right lower lobe.  She received her first dose of the [place] COVID-19 vaccine on 05/11/2019.  Symptomatically, she is doing well.  She has some mild diarrhea controlled with medications.  She had some blood streaked sputum which has resolved.  Plan: 1.   Labs today: CBC with diff, BMP, uric acid. 2. Chronic lymphocytic leukemia (CLL) Clinically, she denies any B symptoms. Exam reveals no adenopathy or hepatosplenomegaly. WBCincreased from75,600 to 109,000 in past 3 months.                         CLL doubling time is approximately 6 months.                         WBC was 107,200 on 04/24/2019. 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  CCND1, ATM, chromosome 12, and TP53 were normal. She began ibrutinib on 04/24/2019.                         She is tolerating treatment well.   She notes a little diarrhea which is well controlled.   She notes a transient history of blood streaked sputum after coughing.    Patient to contact clinic if returns/progresses.        3. Health maintenance Patientisinthelung cancer screening program. Low-dose chest CT on05/13/2020 was benign. Next annual low dose chest CT due on 07/05/2019. Bilateral mammogram on  11/23/2020revealedno evidence of malignancy.  Continue yearly mammograms. 4.   RTC in 2 weeks for labs (CBC with diff). 5.   RTC in 4 weeks for MD assessment and labs (CBC with diff, BMP, uric acid, LDH).   I discussed the assessment and treatment plan with the patient.  The patient was provided an opportunity to ask questions and all were answered.  The patient agreed with the plan and demonstrated an understanding of the instructions.  The patient was advised to call back if the symptoms worsen or if the condition fails to improve as anticipated.  I provided 20 minutes of face-to-face time during this this encounter and > 50% was spent counseling as documented under my assessment and plan.    Lequita Asal, MD, PhD    05/21/2019, 11:41 AM  I, Selena Batten, am acting as scribe for [employer]. Mike Gip, MD, PhD.  I, Sharley Keeler C. Mike Gip, MD, have reviewed the above documentation for accuracy and completeness, and I agree with the above.

## 2019-05-21 ENCOUNTER — Telehealth: Payer: Self-pay | Admitting: *Deleted

## 2019-05-21 ENCOUNTER — Inpatient Hospital Stay (HOSPITAL_BASED_OUTPATIENT_CLINIC_OR_DEPARTMENT_OTHER): Payer: Medicare HMO | Admitting: Hematology and Oncology

## 2019-05-21 ENCOUNTER — Other Ambulatory Visit: Payer: Self-pay

## 2019-05-21 ENCOUNTER — Encounter: Payer: Self-pay | Admitting: Hematology and Oncology

## 2019-05-21 ENCOUNTER — Inpatient Hospital Stay: Payer: Medicare HMO

## 2019-05-21 VITALS — BP 150/74 | HR 67 | Temp 97.7°F | Resp 18 | Ht 66.0 in | Wt 157.5 lb

## 2019-05-21 DIAGNOSIS — C9110 Chronic lymphocytic leukemia of B-cell type not having achieved remission: Secondary | ICD-10-CM

## 2019-05-21 DIAGNOSIS — Z803 Family history of malignant neoplasm of breast: Secondary | ICD-10-CM | POA: Diagnosis not present

## 2019-05-21 DIAGNOSIS — R69 Illness, unspecified: Secondary | ICD-10-CM | POA: Diagnosis not present

## 2019-05-21 LAB — CBC WITH DIFFERENTIAL/PLATELET
Abs Immature Granulocytes: 0.29 10*3/uL — ABNORMAL HIGH (ref 0.00–0.07)
Basophils Absolute: 0.1 10*3/uL (ref 0.0–0.1)
Basophils Relative: 0 %
Eosinophils Absolute: 0.1 10*3/uL (ref 0.0–0.5)
Eosinophils Relative: 0 %
HCT: 45.1 % (ref 36.0–46.0)
Hemoglobin: 13.5 g/dL (ref 12.0–15.0)
Immature Granulocytes: 0 %
Lymphocytes Relative: 98 %
Lymphs Abs: 232.7 10*3/uL — ABNORMAL HIGH (ref 0.7–4.0)
MCH: 31.5 pg (ref 26.0–34.0)
MCHC: 29.9 g/dL — ABNORMAL LOW (ref 30.0–36.0)
MCV: 105.4 fL — ABNORMAL HIGH (ref 80.0–100.0)
Monocytes Absolute: 0.8 10*3/uL (ref 0.1–1.0)
Monocytes Relative: 0 %
Neutro Abs: 3.8 10*3/uL (ref 1.7–7.7)
Neutrophils Relative %: 2 %
Platelets: 161 10*3/uL (ref 150–400)
RBC: 4.28 MIL/uL (ref 3.87–5.11)
WBC: 237.7 10*3/uL (ref 4.0–10.5)
nRBC: 0 % (ref 0.0–0.2)

## 2019-05-21 LAB — BASIC METABOLIC PANEL
Anion gap: 9 (ref 5–15)
BUN: 8 mg/dL (ref 8–23)
CO2: 24 mmol/L (ref 22–32)
Calcium: 9.1 mg/dL (ref 8.9–10.3)
Chloride: 104 mmol/L (ref 98–111)
Creatinine, Ser: 0.75 mg/dL (ref 0.44–1.00)
GFR calc Af Amer: >60 mL/min (ref >60)
GFR calc non Af Amer: >60 mL/min (ref >60)
Glucose, Bld: 90 mg/dL (ref 70–99)
Potassium: 4 mmol/L (ref 3.5–5.1)
Sodium: 137 mmol/L (ref 135–145)

## 2019-05-21 LAB — URIC ACID: Uric Acid, Serum: 4.5 mg/dL (ref 2.5–7.1)

## 2019-05-21 NOTE — Progress Notes (Signed)
Patient c/o diarrhea with started Ibrutinib 420 mg daily

## 2019-05-21 NOTE — Telephone Encounter (Signed)
Lab called to day that patient's WBC was 237.7 today.  MD team notified.

## 2019-05-30 ENCOUNTER — Telehealth: Payer: Self-pay | Admitting: *Deleted

## 2019-05-30 ENCOUNTER — Telehealth: Payer: Self-pay | Admitting: Hematology and Oncology

## 2019-05-30 ENCOUNTER — Inpatient Hospital Stay: Payer: Medicare HMO | Attending: Hematology and Oncology

## 2019-05-30 ENCOUNTER — Other Ambulatory Visit: Payer: Self-pay

## 2019-05-30 ENCOUNTER — Other Ambulatory Visit: Payer: Self-pay | Admitting: Oncology

## 2019-05-30 DIAGNOSIS — Z803 Family history of malignant neoplasm of breast: Secondary | ICD-10-CM | POA: Diagnosis not present

## 2019-05-30 DIAGNOSIS — R310 Gross hematuria: Secondary | ICD-10-CM | POA: Diagnosis present

## 2019-05-30 DIAGNOSIS — F17210 Nicotine dependence, cigarettes, uncomplicated: Secondary | ICD-10-CM | POA: Insufficient documentation

## 2019-05-30 DIAGNOSIS — Z90710 Acquired absence of both cervix and uterus: Secondary | ICD-10-CM | POA: Insufficient documentation

## 2019-05-30 DIAGNOSIS — R319 Hematuria, unspecified: Secondary | ICD-10-CM

## 2019-05-30 DIAGNOSIS — C9110 Chronic lymphocytic leukemia of B-cell type not having achieved remission: Secondary | ICD-10-CM | POA: Insufficient documentation

## 2019-05-30 DIAGNOSIS — R69 Illness, unspecified: Secondary | ICD-10-CM | POA: Diagnosis not present

## 2019-05-30 LAB — COMPREHENSIVE METABOLIC PANEL
ALT: 11 U/L (ref 0–44)
AST: 18 U/L (ref 15–41)
Albumin: 4.5 g/dL (ref 3.5–5.0)
Alkaline Phosphatase: 74 U/L (ref 38–126)
Anion gap: 9 (ref 5–15)
BUN: 8 mg/dL (ref 8–23)
CO2: 28 mmol/L (ref 22–32)
Calcium: 9.1 mg/dL (ref 8.9–10.3)
Chloride: 103 mmol/L (ref 98–111)
Creatinine, Ser: 0.68 mg/dL (ref 0.44–1.00)
GFR calc Af Amer: >60 mL/min (ref >60)
GFR calc non Af Amer: >60 mL/min (ref >60)
Glucose, Bld: 90 mg/dL (ref 70–99)
Potassium: 3.9 mmol/L (ref 3.5–5.1)
Sodium: 140 mmol/L (ref 135–145)
Total Bilirubin: 1.2 mg/dL (ref 0.3–1.2)
Total Protein: 6.6 g/dL (ref 6.5–8.1)

## 2019-05-30 LAB — URINALYSIS, COMPLETE (UACMP) WITH MICROSCOPIC
Bacteria, UA: NONE SEEN
Bilirubin Urine: NEGATIVE
Glucose, UA: NEGATIVE mg/dL
Ketones, ur: NEGATIVE mg/dL
Leukocytes,Ua: NEGATIVE
Nitrite: NEGATIVE
Protein, ur: NEGATIVE mg/dL
RBC / HPF: >50 RBC/hpf (ref 0–5)
Specific Gravity, Urine: 1.02 (ref 1.005–1.030)
Squamous Epithelial / HPF: NONE SEEN (ref 0–5)
pH: 7 (ref 5.0–8.0)

## 2019-05-30 LAB — CBC WITH DIFFERENTIAL/PLATELET
Abs Immature Granulocytes: 0.33 10*3/uL — ABNORMAL HIGH (ref 0.00–0.07)
Basophils Absolute: 0.1 10*3/uL (ref 0.0–0.1)
Basophils Relative: 0 %
Eosinophils Absolute: 0.1 10*3/uL (ref 0.0–0.5)
Eosinophils Relative: 0 %
HCT: 40.3 % (ref 36.0–46.0)
Hemoglobin: 12.8 g/dL (ref 12.0–15.0)
Immature Granulocytes: 0 %
Lymphocytes Relative: 98 %
Lymphs Abs: 239.2 10*3/uL — ABNORMAL HIGH (ref 0.7–4.0)
MCH: 30.7 pg (ref 26.0–34.0)
MCHC: 28.8 g/dL — ABNORMAL LOW (ref 30.0–36.0)
MCV: 106.7 fL — ABNORMAL HIGH (ref 80.0–100.0)
Monocytes Absolute: 0.8 10*3/uL (ref 0.1–1.0)
Monocytes Relative: 0 %
Neutro Abs: 4.3 10*3/uL (ref 1.7–7.7)
Neutrophils Relative %: 2 %
Platelets: 142 10*3/uL — ABNORMAL LOW (ref 150–400)
RBC: 3.92 MIL/uL (ref 3.87–5.11)
WBC: 245.6 10*3/uL (ref 4.0–10.5)
nRBC: 0 % (ref 0.0–0.2)

## 2019-05-30 NOTE — Progress Notes (Signed)
Labs look pretty good. Kidney function okay. UA revealed a large amount of blood but no UTI.  Platelet count 142,000. Looked up SE from ibrutinib and can apparently cause hemorrhage and UTI.  I am stumped.  She is not on a blood thinner except for 81 mg aspirin.  No UTI-like symptoms.  What are your thoughts?  Faythe Casa, NP 05/30/2019 11:16 AM

## 2019-05-30 NOTE — Progress Notes (Signed)
Re: Hematuria  Patient called complaining of pink-tinged urine for about 12 hours.  Patient is currently on ibrutinib for CLL.    Have asked that patient come in for lab work (CBC, CMP) and a urinalysis to rule out UTI.  Will wait for lab results to determine if further work-up should be completed.  Dr. Mike Gip updated.  Faythe Casa, NP 05/30/2019 9:04 AM

## 2019-05-30 NOTE — Telephone Encounter (Signed)
Re:  Hematuria  The patient described new onset pink urine.  Patient denies gross hematuria. Labs ordered by Rulon Abide, NP.  UA reveals > 50 RBCs and large Hgb. No evidence of UTI.  Patient previously on a baby aspirin, but no longer taking.  She is not taking any NSAIDs.   She notes a distant history of kidney stones (1970s).  I discussed referral to urology to r/o a structural issue.  As her hematuria may be secondary to ibrutinib, I discussed holding ibrutinib for now.  I reviewed that there have  been cases of hemorrhage associated with ibrutinib, and if this is an issue, we may need to change therapy for her CLL.  She notes a slight nose bleed with blowing her nose at the end of our conversation.  She is to call for any gross hematuria.  The on-call physician can be reached after hours. The numbers for the [facility] in [place] as well as the main number at [place] were provided.  She will be contacted in the morning regarding f/u appointments.   Lequita Asal, MD

## 2019-05-30 NOTE — Telephone Encounter (Signed)
Lab called to say that patient's WBC is 245.6 today.  The number of WBCs is interfering with the numbers for the other counts but they will get it into the computer soon.  Hbg 12.8.  MD team made aware.

## 2019-05-30 NOTE — Telephone Encounter (Signed)
Patient called reporting that she started having pink urine yesterday at 4 PM and continued until this morning. States the last 3 times she urinated this morning it has been clear. She is requesting a return call to discuss this.

## 2019-05-30 NOTE — Telephone Encounter (Signed)
Spoke to patient via telephone. Patient states that her urine is clear at this time and she has no other symptoms. She agreed to go to the [facility] this morning for lab work and urine specimen. We will call her with results and appointments as needed.

## 2019-05-31 ENCOUNTER — Other Ambulatory Visit: Payer: Self-pay | Admitting: Oncology

## 2019-05-31 DIAGNOSIS — R319 Hematuria, unspecified: Secondary | ICD-10-CM

## 2019-05-31 LAB — URINE CULTURE: Culture: NO GROWTH

## 2019-05-31 NOTE — Progress Notes (Signed)
[id number]  UA was negative for UTI but positive for gross hematuria.  Reviewed patient medication list.  Patient is not currently taking aspirin.  Reviewed side effects of ibrutinib which can include hemorrhage and UTI.  Spoke to Dr. Mike Gip, who recommends urgent referral to urology.  Patient has been asked to stop ibrutinib at this time to see if symptoms resolved.  Patient admits to a single episode of pink-tinged sputum and nosebleed.   Dr. Mike Gip reached out to oral chemotherapy pharmacist Nuala Alpha) to discuss possible reduction in imbrutinb should symptoms resolve while off.   Will call patient to discuss.  Patient to report to emergency room if symptoms worsen.  Faythe Casa, NP 05/31/2019 9:21 AM

## 2019-06-01 ENCOUNTER — Other Ambulatory Visit: Payer: Self-pay | Admitting: Nurse Practitioner

## 2019-06-01 ENCOUNTER — Other Ambulatory Visit: Payer: Self-pay

## 2019-06-01 ENCOUNTER — Telehealth: Payer: Self-pay | Admitting: *Deleted

## 2019-06-01 ENCOUNTER — Ambulatory Visit
Admission: RE | Admit: 2019-06-01 | Discharge: 2019-06-01 | Disposition: A | Discharge status: Home / Self Care | Payer: Medicare HMO | Source: Ambulatory Visit | Attending: Nurse Practitioner | Admitting: Nurse Practitioner

## 2019-06-01 ENCOUNTER — Other Ambulatory Visit: Payer: Self-pay | Admitting: Oncology

## 2019-06-01 ENCOUNTER — Encounter: Payer: Self-pay | Admitting: Oncology

## 2019-06-01 ENCOUNTER — Inpatient Hospital Stay: Payer: Medicare HMO

## 2019-06-01 ENCOUNTER — Inpatient Hospital Stay (HOSPITAL_BASED_OUTPATIENT_CLINIC_OR_DEPARTMENT_OTHER): Payer: Medicare HMO | Admitting: Oncology

## 2019-06-01 ENCOUNTER — Other Ambulatory Visit: Payer: Self-pay | Admitting: *Deleted

## 2019-06-01 VITALS — BP 144/67 | HR 81 | Temp 97.4°F | Resp 17

## 2019-06-01 DIAGNOSIS — C9110 Chronic lymphocytic leukemia of B-cell type not having achieved remission: Secondary | ICD-10-CM | POA: Diagnosis not present

## 2019-06-01 DIAGNOSIS — R319 Hematuria, unspecified: Secondary | ICD-10-CM | POA: Diagnosis not present

## 2019-06-01 DIAGNOSIS — R310 Gross hematuria: Secondary | ICD-10-CM | POA: Diagnosis not present

## 2019-06-01 DIAGNOSIS — K8020 Calculus of gallbladder without cholecystitis without obstruction: Secondary | ICD-10-CM | POA: Diagnosis not present

## 2019-06-01 DIAGNOSIS — R69 Illness, unspecified: Secondary | ICD-10-CM | POA: Diagnosis not present

## 2019-06-01 DIAGNOSIS — Z90710 Acquired absence of both cervix and uterus: Secondary | ICD-10-CM | POA: Diagnosis not present

## 2019-06-01 DIAGNOSIS — Z803 Family history of malignant neoplasm of breast: Secondary | ICD-10-CM | POA: Diagnosis not present

## 2019-06-01 LAB — CBC WITH DIFFERENTIAL/PLATELET
Abs Immature Granulocytes: 0 10*3/uL (ref 0.00–0.07)
Basophils Absolute: 0 10*3/uL (ref 0.0–0.1)
Basophils Relative: 0 %
Eosinophils Absolute: 2.6 10*3/uL — ABNORMAL HIGH (ref 0.0–0.5)
Eosinophils Relative: 1 %
HCT: 43.5 % (ref 36.0–46.0)
Hemoglobin: 13.5 g/dL (ref 12.0–15.0)
Lymphocytes Relative: 96 %
Lymphs Abs: 246.1 10*3/uL — ABNORMAL HIGH (ref 0.7–4.0)
MCH: 32.1 pg (ref 26.0–34.0)
MCHC: 31 g/dL (ref 30.0–36.0)
MCV: 103.3 fL — ABNORMAL HIGH (ref 80.0–100.0)
Monocytes Absolute: 2.6 10*3/uL — ABNORMAL HIGH (ref 0.1–1.0)
Monocytes Relative: 1 %
Neutro Abs: 5.1 10*3/uL (ref 1.7–7.7)
Neutrophils Relative %: 2 %
Platelets: 151 10*3/uL (ref 150–400)
RBC: 4.21 MIL/uL (ref 3.87–5.11)
Smear Review: NORMAL
WBC Morphology: ABNORMAL
WBC: 256.4 10*3/uL (ref 4.0–10.5)
nRBC: 0 % (ref 0.0–0.2)

## 2019-06-01 LAB — PROTIME-INR
INR: 1 (ref 0.8–1.2)
Prothrombin Time: 13 seconds (ref 11.4–15.2)

## 2019-06-01 LAB — APTT: aPTT: 27 seconds (ref 24–36)

## 2019-06-01 MED ORDER — IOHEXOL 300 MG/ML  SOLN
100.0000 mL | Freq: Once | INTRAMUSCULAR | Status: AC | PRN
  Administered 2019-06-01: 100 mL via INTRAVENOUS

## 2019-06-01 NOTE — Telephone Encounter (Signed)
Patient called reporting that Dr Erlene Quan cannot see her until the 15th and she has a lab appointment with [place] on hte 12th. She states she continues to pass blood in her urine. She is asking if she needs to keep her lab appointment Monday or not. Plesae advise

## 2019-06-01 NOTE — Progress Notes (Signed)
Re: Persistent hematuria  Patient had lab work completed on 05/30/2019 for pink-tinged urine. UA revealed gross hematuria. Symptoms had resolved. Spoke with Dr. Mike Gip who recommended urgent urology referral to evaluate for abnormality. This is scheduled for 06/07/2019. This was the earliest appointment.  Patient called this morning stating she is having "clots" with urination beginning this morning.  Would recommend patient to be seen in symptom management with blood work and a CT hematuria work-up including a CT scan. This is scheduled for 115 today at [facility]. She will then come over for lab work and evaluation. We'll discuss her CT scan results at that time.  Patient is aware.   Faythe Casa, NP 06/01/2019 10:41 AM

## 2019-06-01 NOTE — Progress Notes (Signed)
Symptom Management Consult note [facility]  Telephone:(336) [phone] Fax:(336) [phone]  Patient Care Team: [name], MD as PCP - General (Family Medicine) [name], MD as PCP - Family Medicine (Family Medicine) Forest Gleason, MD (Inactive) (Oncology) Oneta Rack, MD (Dermatology) Manya Silvas, MD (Inactive) (Gastroenterology) Bary Castilla Forest Gleason, MD (General Surgery) Lloyd Huger, MD as Medical Oncologist (Hematology and Oncology)   Name of the patient: Katherine [name]  [id number]  06-06-1940   Date of visit: 06/01/2019   Diagnosis-CLL   Chief complaint/ Reason for visit- Hematuria  Heme/Onc history:  Oncology History   No history exists.   Interval history-Katherine [name] is a 79 year old female who presents to symptom management with history significant for anxiety, depression, GERD, osteopenia and currently being treated for CLL by Dr. Mike Gip on oral ibrutinib.   Patient called clinic on 05/30/2019 reporting pink-tinged urine x1-2 episodes.  She denied any dysuria or discomfort.  She was asked to come in for lab work and a urine sample.  Her lab work was essentially stable with a platelet count of 142,000 and hemoglobin mildly decreased at 12.8 from previous.  Urinalysis was positive for gross hematuria.  Given symptoms had resolved and she was not experiencing any additional episodes, it was recommended she follow-up with urology and stop ibrutinib.  Ibrutinib can cause hemorrhage.  Discussed case with Dr. Mike Gip, who agreed.   Upon chart review it appears she was evaluated in the emergency room for hemoptysis.  Imaging did not reveal any acute findings.  She was asked to follow-up with oncology.  No additional episodes per patient.  Patient called back this morning stating that she experienced additional bleeding with clots as at this morning.  Denies any neurologic complaints. Denies recent fevers or illnesses. Denies any  easy bleeding or bruising. Reports good appetite and denies weight loss. Denies chest pain. Denies any nausea, vomiting, constipation, or diarrhea. Denies urinary complaints. Patient offers no further specific complaints today.   ECOG FS:1 - Symptomatic but completely ambulatory  Review of systems- Review of Systems  Constitutional: Negative.  Negative for chills, fever, malaise/fatigue and weight loss.  HENT: Negative for congestion, ear pain and tinnitus.   Eyes: Negative.  Negative for blurred vision and double vision.  Respiratory: Negative.  Negative for cough, sputum production and shortness of breath.   Cardiovascular: Negative.  Negative for chest pain, palpitations and leg swelling.  Gastrointestinal: Negative.  Negative for abdominal pain, constipation, diarrhea, nausea and vomiting.  Genitourinary: Positive for hematuria. Negative for dysuria, frequency and urgency.  Musculoskeletal: Negative for back pain and falls.  Skin: Negative.  Negative for rash.  Neurological: Negative.  Negative for weakness and headaches.  Endo/Heme/Allergies: Negative.  Does not bruise/bleed easily.  Psychiatric/Behavioral: Negative.  Negative for depression. The patient is not nervous/anxious and does not have insomnia.      Current treatment-ibrutinib currently on hold due to hematuria  Allergies  Allergen Reactions  . Augmentin [Amoxicillin-Pot Clavulanate] Swelling    tongue  . Biaxin [Clarithromycin] Swelling and Rash    tongue     Past Medical History:  Diagnosis Date  . Allergy   . Anxiety   . Depression   . GERD (gastroesophageal reflux disease)   . Hemorrhoids   . Hypertension   . Leukemia, lymphoid ([place])    CLL  . Lobar pneumonia ([place])   . Osteopenia   . Personal history of chemotherapy      Past Surgical  History:  Procedure Laterality Date  . ABDOMINAL HYSTERECTOMY    . APPENDECTOMY    . BREAST BIOPSY Left    bx x 3-neg  . COLON SURGERY     sigmoid resection  .  COLONOSCOPY     2007, 2012  . COLONOSCOPY WITH PROPOFOL N/A 09/17/2015   Procedure: COLONOSCOPY WITH PROPOFOL;  Surgeon: Robert Bellow, MD;  Location: [facility] ENDOSCOPY;  Service: Endoscopy;  Laterality: N/A;  . OOPHORECTOMY    . SPINE SURGERY     L4-5    Social History   Socioeconomic History  . Marital status: Married    Spouse name: Not on file  . Number of children: Not on file  . Years of education: 2  . Highest education level: 12th grade  Occupational History  . Not on file  Tobacco Use  . Smoking status: Current Every Day Smoker    Packs/day: 0.25    Years: 35.00    Pack years: 8.75    Types: Cigarettes  . Smokeless tobacco: Never Used  Substance and Sexual Activity  . Alcohol use: No    Alcohol/week: 0.0 standard drinks  . Drug use: No  . Sexual activity: Not on file  Other Topics Concern  . Not on file  Social History Narrative  . Not on file   Social Determinants of Health   Financial Resource Strain:   . Difficulty of Paying Living Expenses:   Food Insecurity:   . Worried About Charity fundraiser in the Last Year:   . Arboriculturist in the Last Year:   Transportation Needs:   . Film/video editor (Medical):   Marland Kitchen Lack of Transportation (Non-Medical):   Physical Activity:   . Days of Exercise per Week:   . Minutes of Exercise per Session:   Stress:   . Feeling of Stress :   Social Connections:   . Frequency of Communication with Friends and Family:   . Frequency of Social Gatherings with Friends and Family:   . Attends Religious Services:   . Active Member of Clubs or Organizations:   . Attends Archivist Meetings:   Marland Kitchen Marital Status:   Intimate Partner Violence:   . Fear of Current or Ex-Partner:   . Emotionally Abused:   Marland Kitchen Physically Abused:   . Sexually Abused:     Family History  Problem Relation Age of Onset  . Osteoporosis Mother   . Hypertension Father   . Heart attack Father   . Stroke Maternal Grandfather   .  Breast cancer Sister 48     Current Outpatient Medications:  .  aspirin EC 81 MG tablet, Take 81 mg by mouth every other day. , Disp: , Rfl:  .  atorvastatin (LIPITOR) 10 MG tablet, Take 1 tablet (10 mg total) by mouth daily., Disp: 90 tablet, Rfl: 1 .  benazepril (LOTENSIN) 40 MG tablet, Take 1 tablet (40 mg total) by mouth daily., Disp: 90 tablet, Rfl: 1 .  diphenhydrAMINE (BENADRYL) 25 MG tablet, Take 25 mg by mouth at bedtime as needed., Disp: , Rfl:  .  Ibrutinib 420 MG TABS, Take 420 mg by mouth daily., Disp: 30 tablet, Rfl: 1 .  LORazepam (ATIVAN) 1 MG tablet, Take 0.5 tablets (0.5 mg total) by mouth 2 (two) times daily as needed for anxiety., Disp: 30 tablet, Rfl: 5 .  Multiple Vitamins-Minerals (MULTIVITAMIN WITH MINERALS) tablet, Take 1 tablet by mouth daily., Disp: , Rfl:  .  omeprazole (PRILOSEC)  20 MG capsule, Take 1 capsule (20 mg total) by mouth daily as needed., Disp: 90 capsule, Rfl: 1 .  PARoxetine (PAXIL) 30 MG tablet, Take 1 tablet (30 mg total) by mouth daily., Disp: 90 tablet, Rfl: 1 .  Triamcinolone Acetonide (NASACORT AQ NA), Place 2 Doses into the nose daily as needed. , Disp: , Rfl:  .  predniSONE (DELTASONE) 10 MG tablet, 6 tabs today and tomorrow, 5 tabs the next 2 days, decrease by 1 every other day until gone (Patient not taking: Reported on 04/24/2019), Disp: 42 tablet, Rfl: 0  Physical exam:  Vitals:   06/01/19 1310  BP: (!) 144/67  Pulse: 81  Resp: 17  Temp: (!) 97.4 F (36.3 C)  TempSrc: Tympanic  SpO2: 99%   Physical Exam Constitutional:      Appearance: Normal appearance.  HENT:     Head: Normocephalic and atraumatic.  Eyes:     Pupils: Pupils are equal, round, and reactive to light.  Cardiovascular:     Rate and Rhythm: Normal rate and regular rhythm.     Heart sounds: Normal heart sounds. No murmur.  Pulmonary:     Effort: Pulmonary effort is normal.     Breath sounds: Normal breath sounds. No wheezing.  Abdominal:     General: Bowel  sounds are normal. There is no distension.     Palpations: Abdomen is soft.     Tenderness: There is no abdominal tenderness.  Musculoskeletal:        General: Normal range of motion.     Cervical back: Normal range of motion.  Skin:    General: Skin is warm and dry.     Findings: No rash.  Neurological:     Mental Status: She is alert and oriented to person, place, and time.  Psychiatric:        Judgment: Judgment normal.      CMP Latest Ref Rng & Units 05/30/2019  Glucose 70 - 99 mg/dL 90  BUN 8 - 23 mg/dL 8  Creatinine 0.44 - 1.00 mg/dL 0.68  Sodium 135 - 145 mmol/L 140  Potassium 3.5 - 5.1 mmol/L 3.9  Chloride 98 - 111 mmol/L 103  CO2 22 - 32 mmol/L 28  Calcium 8.9 - 10.3 mg/dL 9.1  Total Protein 6.5 - 8.1 g/dL 6.6  Total Bilirubin 0.3 - 1.2 mg/dL 1.2  Alkaline Phos 38 - 126 U/L 74  AST 15 - 41 U/L 18  ALT 0 - 44 U/L 11   CBC Latest Ref Rng & Units 06/01/2019  WBC 4.0 - 10.5 K/uL 256.4(HH)  Hemoglobin 12.0 - 15.0 g/dL 13.5  Hematocrit 36.0 - 46.0 % 43.5  Platelets 150 - 400 K/uL 151    No images are attached to the encounter.  DG Chest 2 View  Result Date: 05/15/2019 CLINICAL DATA:  79 year old female with blood tinged sputum. EXAM: CHEST - 2 VIEW COMPARISON:  Chest radiograph dated 01/07/2016. FINDINGS: Background of emphysema. No focal consolidation, pleural effusion, pneumothorax. The cardiac silhouette is within limits. No acute osseous pathology. Degenerative changes of the spine. Atherosclerotic calcification of the aorta. IMPRESSION: No acute cardiopulmonary process. Emphysema. Electronically Signed   By: Anner Crete M.D.   On: 05/15/2019 18:30   CT HEMATURIA WORKUP  Result Date: 06/01/2019 CLINICAL DATA:  Gross hematuria.  Recent chemotherapy for leukemia EXAM: CT ABDOMEN AND PELVIS WITHOUT AND WITH CONTRAST TECHNIQUE: Multidetector CT imaging of the abdomen and pelvis was performed following the standard protocol before and following the  bolus administration  of intravenous contrast. CONTRAST:  125mL OMNIPAQUE IOHEXOL 300 MG/ML  SOLN COMPARISON:  CT 12/03/2017 FINDINGS: Lower chest: Lung bases are clear. Hepatobiliary: No focal hepatic lesion. Several large gallstones within lumen gallbladder. No gallbladder inflammation. Pancreas: Pancreas is normal. No ductal dilatation. No pancreatic inflammation. Spleen: Dystrophic calcifications within the spleen. Adrenals/urinary tract: Adrenal glands normal. No nephrolithiasis or ureterolithiasis. No bladder calculi. The bladder is collapsed and poorly evaluated. Stomach/Bowel: Stomach, small-bowel terminal ileum normal. There is a enteric enteric anastomosis in deep RIGHT pelvis. Colon and rectosigmoid colon normal. Vascular/Lymphatic: Abdominal aorta is normal caliber with atherosclerotic calcification. There is no retroperitoneal or periportal lymphadenopathy. No pelvic lymphadenopathy. Reproductive: Post hysterectomy Other: No free fluid. Musculoskeletal: Scoliosis in severe degenerative osteophytosis of the spine. IMPRESSION: 1. No clear explanation for hematuria. 2. No nephrolithiasis, ureterolithiasis or obstructive uropathy. 3. Bladder is collapsed.  No bladder stones. 4. Cholelithiasis without evidence cholecystitis. 5.  Aortic Atherosclerosis (ICD10-I70.0). 6. Disc osteophytic disease and scoliosis of the lumbar spine Electronically Signed   By: Suzy Bouchard M.D.   On: 06/01/2019 14:28   Assessment and plan- Patient is a 79 y.o. female who presents to symptom management for additional lab work and hematuria work-up.  Hematuria unclear etiology.  Spoke with Dr. Mike Gip who recommended urgent referral to urology.  Unfortunately the soonest date to be evaluated is on 06/07/2019. Since she developed worsening hematuria with clots, would recommend getting CT scan for evaluation along with lab work.  She has already stopped taking ibrutinib as instructed as of 05/30/2019.  Ibrutinib can cause hemorrhage.  If symptoms are  persistent, lab work is unstable and CT scan abnormal would recommend emergency room.  Plan: Labs. Stable. CT hematuria workup- Negative for acute abnormality.  Continue to hold imbrutinib.   Disposition: CT hematuria work-up scheduled for 130 today. RTC on 06/19/19 to see Dr. Mike Gip as scheduled. ( will try and get this moved up) Keep scheduled urology appt with Dr. Erlene Quan on 06/07/19.   Visit Diagnosis 1. Hematuria, unspecified type     Patient expressed understanding and was in agreement with this plan. She also understands that She can call clinic at any time with any questions, concerns, or complaints.   Greater than 50% was spent in counseling and coordination of care with this patient including but not limited to discussion of the relevant topics above (See A&P) including, but not limited to diagnosis and management of acute and chronic medical conditions.   Thank you for allowing me to participate in the care of this very pleasant patient.    Jacquelin Hawking, NP [place] at [facility] Cell - [phone] Pager- [phone] 06/04/2019 9:30 AM

## 2019-06-01 NOTE — Progress Notes (Signed)
He Dr. Mike Gip.  I saw Katherine [name] today and we drew additional lab work and got a CT hematuria work-up which did not reveal any reason for her hematuria.  Labs continue to be stable.  She denies any pain or discomfort.  She has a urology appointment next week.  I told her if symptoms worsened to please go to the emergency room over the weekend.  Is this an okay plan?  Faythe Casa, NP 06/01/2019 3:09 PM

## 2019-06-01 NOTE — Telephone Encounter (Signed)
  Please call patient.  She should be off ibrutinib.  How much blood in her urine?  Is it getting worse?  If so, she should go to symptom management.  M

## 2019-06-04 ENCOUNTER — Other Ambulatory Visit: Payer: Self-pay

## 2019-06-04 ENCOUNTER — Inpatient Hospital Stay: Payer: Medicare HMO

## 2019-06-04 DIAGNOSIS — C9110 Chronic lymphocytic leukemia of B-cell type not having achieved remission: Secondary | ICD-10-CM | POA: Diagnosis not present

## 2019-06-04 DIAGNOSIS — R69 Illness, unspecified: Secondary | ICD-10-CM | POA: Diagnosis not present

## 2019-06-04 DIAGNOSIS — R310 Gross hematuria: Secondary | ICD-10-CM | POA: Diagnosis not present

## 2019-06-04 DIAGNOSIS — Z803 Family history of malignant neoplasm of breast: Secondary | ICD-10-CM | POA: Diagnosis not present

## 2019-06-04 DIAGNOSIS — Z90710 Acquired absence of both cervix and uterus: Secondary | ICD-10-CM | POA: Diagnosis not present

## 2019-06-04 LAB — CBC WITH DIFFERENTIAL/PLATELET
Abs Immature Granulocytes: 0.29 10*3/uL — ABNORMAL HIGH (ref 0.00–0.07)
Basophils Absolute: 0.1 10*3/uL (ref 0.0–0.1)
Basophils Relative: 0 %
Eosinophils Absolute: 0.2 10*3/uL (ref 0.0–0.5)
Eosinophils Relative: 0 %
HCT: 43.3 % (ref 36.0–46.0)
Hemoglobin: 12.8 g/dL (ref 12.0–15.0)
Immature Granulocytes: 0 %
Lymphocytes Relative: 98 %
Lymphs Abs: 214.9 10*3/uL — ABNORMAL HIGH (ref 0.7–4.0)
MCH: 30.8 pg (ref 26.0–34.0)
MCHC: 29.6 g/dL — ABNORMAL LOW (ref 30.0–36.0)
MCV: 104.3 fL — ABNORMAL HIGH (ref 80.0–100.0)
Monocytes Absolute: 0.9 10*3/uL (ref 0.1–1.0)
Monocytes Relative: 0 %
Neutro Abs: 4 10*3/uL (ref 1.7–7.7)
Neutrophils Relative %: 2 %
Platelets: 146 10*3/uL — ABNORMAL LOW (ref 150–400)
RBC: 4.15 MIL/uL (ref 3.87–5.11)
WBC: 220.4 10*3/uL (ref 4.0–10.5)
nRBC: 0 % (ref 0.0–0.2)

## 2019-06-06 NOTE — Progress Notes (Signed)
06/07/19 1:59 PM   Katherine [name] 09/10/1940 [id number]  Referring provider: Jacquelin Hawking, NP [place],  [place] [address]  Chief Complaint  Patient presents with  . Hematuria    HPI: Katherine [name] is a 79 y.o. F who presents today for the evaluation and management of hematuria.   On 05/30/19 pt reported to PCP of pink-tinged urine x 1-2 episodes leading her to come in for lab work and urine sample. Her urinalysis indicated microscopic hematuria with >50 RBC/hpf. Associated urine culture negative.   She has a hx of CLL managed by Dr. Mike Gip.   She underwent a CTU on 06/01/19 which was unremarkable.   She was on a Ibrutinib for 5 weeks w/o issues, thereafter she started seeing blood in her urine for 5 days. This past Saturday was her last episode of gross hematuria. She has not seen blood in her urine prior to use of pill.    Denies clots, pain, and frequency. Attributes her urgency to age.   Hx of smoking 5 cigarettes per day. Increases amount if she is anxious however less than a ppd. She started smoking at 79 yo.  PMH: Past Medical History:  Diagnosis Date  . Allergy   . Anxiety   . Depression   . GERD (gastroesophageal reflux disease)   . Hemorrhoids   . Hypertension   . Leukemia, lymphoid ([place])    CLL  . Lobar pneumonia ([place])   . Osteopenia   . Personal history of chemotherapy     Surgical History: Past Surgical History:  Procedure Laterality Date  . ABDOMINAL HYSTERECTOMY    . APPENDECTOMY    . BREAST BIOPSY Left    bx x 3-neg  . COLON SURGERY     sigmoid resection  . COLONOSCOPY     2007, 2012  . COLONOSCOPY WITH PROPOFOL N/A 09/17/2015   Procedure: COLONOSCOPY WITH PROPOFOL;  Surgeon: Robert Bellow, MD;  Location: [facility] ENDOSCOPY;  Service: Endoscopy;  Laterality: N/A;  . OOPHORECTOMY    . SPINE SURGERY     L4-5    Home Medications:  Allergies as of 06/07/2019      Reactions   Augmentin [amoxicillin-pot  Clavulanate] Swelling   tongue   Biaxin [clarithromycin] Swelling, Rash   tongue      Medication List       Accurate as of June 07, 2019  1:59 PM. If you have any questions, ask your nurse or doctor.        aspirin EC 81 MG tablet Take 81 mg by mouth every other day.   atorvastatin 10 MG tablet Commonly known as: LIPITOR Take 1 tablet (10 mg total) by mouth daily.   benazepril 40 MG tablet Commonly known as: LOTENSIN Take 1 tablet (40 mg total) by mouth daily.   diphenhydrAMINE 25 MG tablet Commonly known as: BENADRYL Take 25 mg by mouth at bedtime as needed.   Ibrutinib 420 MG Tabs Take 420 mg by mouth daily.   LORazepam 1 MG tablet Commonly known as: ATIVAN Take 0.5 tablets (0.5 mg total) by mouth 2 (two) times daily as needed for anxiety.   multivitamin with minerals tablet Take 1 tablet by mouth daily.   NASACORT AQ NA Place 2 Doses into the nose daily as needed.   omeprazole 20 MG capsule Commonly known as: PRILOSEC Take 1 capsule (20 mg total) by mouth daily as needed.   PARoxetine 30 MG tablet Commonly known as: PAXIL Take  1 tablet (30 mg total) by mouth daily.   predniSONE 10 MG tablet Commonly known as: DELTASONE 6 tabs today and tomorrow, 5 tabs the next 2 days, decrease by 1 every other day until gone       Allergies:  Allergies  Allergen Reactions  . Augmentin [Amoxicillin-Pot Clavulanate] Swelling    tongue  . Biaxin [Clarithromycin] Swelling and Rash    tongue    Family History: Family History  Problem Relation Age of Onset  . Osteoporosis Mother   . Hypertension Father   . Heart attack Father   . Stroke Maternal Grandfather   . Breast cancer Sister 68    Social History:  reports that she has been smoking cigarettes. She has a 8.75 pack-year smoking history. She has never used smokeless tobacco. She reports that she does not drink alcohol or use drugs.   Physical Exam: BP (!) 152/80   Pulse 91   Ht 5\' 6"  (1.676 m)   Wt  155 lb (70.3 kg)   BMI 25.02 kg/m   Constitutional:  Alert and oriented, No acute distress. HEENT: [place] AT, moist mucus membranes.  Trachea midline, no masses. Cardiovascular: No clubbing, cyanosis, or edema. Respiratory: Normal respiratory effort, no increased work of breathing. Skin: No rashes, bruises or suspicious lesions. Neurologic: Grossly intact, no focal deficits, moving all 4 extremities. Psychiatric: Normal mood and affect.  Laboratory Data:  Lab Results  Component Value Date   CREATININE 0.68 05/30/2019   Urinalysis UA positive for trace ketones, trace blood, 1+ leuk, and 6-10 WBC.   Pertinent Imaging: Results for orders placed during the hospital encounter of 06/01/19  CT HEMATURIA WORKUP   Narrative CLINICAL DATA:  Gross hematuria.  Recent chemotherapy for leukemia  EXAM: CT ABDOMEN AND PELVIS WITHOUT AND WITH CONTRAST  TECHNIQUE: Multidetector CT imaging of the abdomen and pelvis was performed following the standard protocol before and following the bolus administration of intravenous contrast.  CONTRAST:  155mL OMNIPAQUE IOHEXOL 300 MG/ML  SOLN  COMPARISON:  CT 12/03/2017  FINDINGS: Lower chest: Lung bases are clear.  Hepatobiliary: No focal hepatic lesion. Several large gallstones within lumen gallbladder. No gallbladder inflammation.  Pancreas: Pancreas is normal. No ductal dilatation. No pancreatic inflammation.  Spleen: Dystrophic calcifications within the spleen.  Adrenals/urinary tract: Adrenal glands normal. No nephrolithiasis or ureterolithiasis. No bladder calculi. The bladder is collapsed and poorly evaluated.  Stomach/Bowel: Stomach, small-bowel terminal ileum normal. There is a enteric enteric anastomosis in deep RIGHT pelvis. Colon and rectosigmoid colon normal.  Vascular/Lymphatic: Abdominal aorta is normal caliber with atherosclerotic calcification. There is no retroperitoneal or periportal lymphadenopathy. No pelvic  lymphadenopathy.  Reproductive: Post hysterectomy  Other: No free fluid.  Musculoskeletal: Scoliosis in severe degenerative osteophytosis of the spine.  IMPRESSION: 1. No clear explanation for hematuria. 2. No nephrolithiasis, ureterolithiasis or obstructive uropathy. 3. Bladder is collapsed.  No bladder stones. 4. Cholelithiasis without evidence cholecystitis. 5.  Aortic Atherosclerosis (ICD10-I70.0). 6. Disc osteophytic disease and scoliosis of the lumbar spine   Electronically Signed   By: Suzy Bouchard M.D.   On: 06/01/2019 14:28    I have personally reviewed the images and agree with radiologist interpretation.   Assessment & Plan:    1. Microscopic hematuria  UA today indicated no blood We discussed the differential diagnosis for microscopic hematuria including nephrolithiasis, renal or upper tract tumors, bladder stones, UTIs, or bladder tumors as well as undetermined etiologies. Underwent CTU on 06/01/19 which was unremarkable  Dicussed the procedure of cystoscopy  in detail (hand out given)  Return for cysto to complete hematuria workup.   2. Smoking Counseled on smoking cessation  Suspect for bladder cancer  3. Pyuria UA today indicated pyuria Asymptomatic  Will send for urine culture   Schedule cysto next week  [facility] [address] [place], [place] [address] [phone]  I, Lucas Mallow, am acting as a scribe for Dr. Hollice Espy,  I have reviewed the above documentation for accuracy and completeness, and I agree with the above.   Hollice Espy, MD

## 2019-06-07 ENCOUNTER — Ambulatory Visit: Payer: Medicare HMO | Admitting: Urology

## 2019-06-07 ENCOUNTER — Other Ambulatory Visit: Payer: Self-pay

## 2019-06-07 VITALS — BP 152/80 | HR 91 | Ht 66.0 in | Wt 155.0 lb

## 2019-06-07 DIAGNOSIS — R8281 Pyuria: Secondary | ICD-10-CM | POA: Diagnosis not present

## 2019-06-07 DIAGNOSIS — R319 Hematuria, unspecified: Secondary | ICD-10-CM

## 2019-06-07 DIAGNOSIS — R310 Gross hematuria: Secondary | ICD-10-CM

## 2019-06-07 LAB — URINALYSIS, COMPLETE
Bilirubin, UA: NEGATIVE
Glucose, UA: NEGATIVE
Nitrite, UA: NEGATIVE
Protein,UA: NEGATIVE
Specific Gravity, UA: 1.02 (ref 1.005–1.030)
Urobilinogen, Ur: 0.2 mg/dL (ref 0.2–1.0)
pH, UA: 7 (ref 5.0–7.5)

## 2019-06-07 LAB — MICROSCOPIC EXAMINATION

## 2019-06-07 NOTE — Patient Instructions (Signed)
Cystoscopy Cystoscopy is a procedure that is used to help diagnose and sometimes treat conditions that affect the lower urinary tract. The lower urinary tract includes the bladder and the urethra. The urethra is the tube that drains urine from the bladder. Cystoscopy is done using a thin, tube-shaped instrument with a light and camera at the end (cystoscope). The cystoscope may be hard or flexible, depending on the goal of the procedure. The cystoscope is inserted through the urethra, into the bladder. Cystoscopy may be recommended if you have:  Urinary tract infections that keep coming back.  Blood in the urine (hematuria).  An inability to control when you urinate (urinary incontinence) or an overactive bladder.  Unusual cells found in a urine sample.  A blockage in the urethra, such as a urinary stone.  Painful urination.  An abnormality in the bladder found during an intravenous pyelogram (IVP) or CT scan. Cystoscopy may also be done to remove a sample of tissue to be examined under a microscope (biopsy). What are the risks? Generally, this is a safe procedure. However, problems may occur, including:  Infection.  Bleeding.  What happens during the procedure?  1. You will be given one or more of the following: ? A medicine to numb the area (local anesthetic). 2. The area around the opening of your urethra will be cleaned. 3. The cystoscope will be passed through your urethra into your bladder. 4. Germ-free (sterile) fluid will flow through the cystoscope to fill your bladder. The fluid will stretch your bladder so that your health care provider can clearly examine your bladder walls. 5. Your doctor will look at the urethra and bladder. 6. The cystoscope will be removed The procedure may vary among health care providers  What can I expect after the procedure? After the procedure, it is common to have: 1. Some soreness or pain in your abdomen and urethra. 2. Urinary symptoms.  These include: ? Mild pain or burning when you urinate. Pain should stop within a few minutes after you urinate. This may last for up to 1 week. ? A small amount of blood in your urine for several days. ? Feeling like you need to urinate but producing only a small amount of urine. Follow these instructions at home: General instructions  Return to your normal activities as told by your health care provider.   Do not drive for 24 hours if you were given a sedative during your procedure.  Watch for any blood in your urine. If the amount of blood in your urine increases, call your health care provider.  If a tissue sample was removed for testing (biopsy) during your procedure, it is up to you to get your test results. Ask your health care provider, or the department that is doing the test, when your results will be ready.  Drink enough fluid to keep your urine pale yellow.  Keep all follow-up visits as told by your health care provider. This is important. Contact a health care provider if you:  Have pain that gets worse or does not get better with medicine, especially pain when you urinate.  Have trouble urinating.  Have more blood in your urine. Get help right away if you:  Have blood clots in your urine.  Have abdominal pain.  Have a fever or chills.  Are unable to urinate. Summary  Cystoscopy is a procedure that is used to help diagnose and sometimes treat conditions that affect the lower urinary tract.  Cystoscopy is done using   a thin, tube-shaped instrument with a light and camera at the end.  After the procedure, it is common to have some soreness or pain in your abdomen and urethra.  Watch for any blood in your urine. If the amount of blood in your urine increases, call your health care provider.  If you were prescribed an antibiotic medicine, take it as told by your health care provider. Do not stop taking the antibiotic even if you start to feel better. This  information is not intended to replace advice given to you by your health care provider. Make sure you discuss any questions you have with your health care provider. Document Revised: 01/31/2018 Document Reviewed: 01/31/2018 Elsevier Patient Education  2020 [employer]   

## 2019-06-11 LAB — CULTURE, URINE COMPREHENSIVE

## 2019-06-12 NOTE — Progress Notes (Signed)
   06/13/19  CC:  Chief Complaint  Patient presents with  . Cysto    HPI: Katherine [name] is a 79 y.o. F who returns today for a cysto for the evaluation and management of hematuria.   On 05/30/19 pt reported to PCP of pink-tinged urine x 1-2 episodes leading her to come in for lab work and urine sample. Her urinalysis indicated microscopic hematuria with >50 RBC/hpf. Associated urine culture negative.   She underwent a CTU on 06/01/19 which was unremarkable.  She was on a Ibrutinib for 5 weeks w/o issues, thereafter she started seeing blood in her urine for 5 days. 06/02/19 was her last episode of gross hematuria. She has not seen blood in her urine prior to use of pill.    Hx of smoking 5 cigarettes per day. Increases amount if she is anxious however less than a ppd. She started smoking at 79 yo.  She has a hx of CLL managed by Dr. Mike Gip.  Vitals:   06/13/19 1019  BP: 138/74  Pulse: 97    NED. A&Ox3.   No respiratory distress   Abd soft, NT, ND Normal external genitalia with patent urethral meatus  Cystoscopy Procedure Note  Patient identification was confirmed, informed consent was obtained, and patient was prepped using Betadine solution.  Lidocaine jelly was administered per urethral meatus.    Procedure: - Flexible cystoscope introduced, without any difficulty.   - Thorough search of the bladder revealed:    normal urethral meatus    normal urothelium    no stones    no ulcers     no tumors    no urethral polyps    no trabeculation  - Ureteral orifices were normal in position and appearance.  Post-Procedure: - Patient tolerated the procedure well  Assessment/ Plan:  1. Microscopic hematuria  CTU on 06/01/19 was unremarkable.  NED on cysto  Return in 1 year for UA w/ PA-C or sooner if she experiences gross hematuria     I, Lucas Mallow, am acting as a scribe for Dr. Hollice Espy,  I have reviewed the above documentation for accuracy and  completeness, and I agree with the above.   Hollice Espy, MD

## 2019-06-13 ENCOUNTER — Ambulatory Visit: Payer: Medicare HMO | Admitting: Urology

## 2019-06-13 ENCOUNTER — Other Ambulatory Visit: Payer: Self-pay

## 2019-06-13 ENCOUNTER — Encounter: Payer: Self-pay | Admitting: Urology

## 2019-06-13 VITALS — BP 138/74 | HR 97 | Ht 66.0 in | Wt 157.0 lb

## 2019-06-13 DIAGNOSIS — R310 Gross hematuria: Secondary | ICD-10-CM

## 2019-06-15 ENCOUNTER — Telehealth: Payer: Self-pay

## 2019-06-15 NOTE — Telephone Encounter (Signed)
Patient has been notified that the low dose lung cancer screening CT scan is due currently or will be in near future.  She has a lot going on right now and does not want to have CT scan scheduled at this time.

## 2019-06-18 NOTE — Progress Notes (Signed)
[facility]  [address] [place], [place] [address] Phone: [phone]  Fax: [phone]   Clinic Day:  06/19/2019  Referring physician: [name], MD  Chief Complaint: Katherine [name] is a 79 y.o. female with chronic lymphocytic leukemia (CLL) who is seen for 4 week assessment.  HPI: Katherine patient was last seen in Katherine medical oncology clinic on 05/21/2019. At that time, she was doing well. She had some mild diarrhea controlled with medications. She had some blood streaked sputum which has resolved. Hematocrit was 45.1, hemoglobin 13.5, MCV 105.4, platelets 161,000, WBC 237,700 (ANC 3,800; ALC 232,700). CMP was normal. Uric acid was 4.5.   Patient spoke with Faythe Casa, NP on 05/30/2019. She noted pink-tinged urine for about 12 hours. Patient denied gross hematuria. Urinalysis on 05/30/2019 revealed large hemoglobin urine dipstick. Urine culture showed no growth. I directed patient to hold ibrutinib as it may have been contributing to hematuria. I recommended a referral to urology.   She was seen in Katherine symptoms management clinic by Faythe Casa, NP on 06/01/2019 for hematuria. Given symptoms had resolved and she was not experiencing any additional episodes, it was recommended she follow-up with urology and stop ibrutinib. She denied any additional episodes. She had no complaints. Patient continued to hold ibrutinib.  CT hematuria work up on 06/01/2019 revealed no clear explanation for hematuria. There was no nephrolithiasis, ureterolithiasis or obstructive uropathy. Bladder was collapsed. There were no bladder stones. There was cholelithiasis without evidence cholecystitis. There was disc osteophytic disease and scoliosis of Katherine lumbar spine  Patient was seen by Dr. Hollice Espy on 06/07/2019. Her last episode of gross hematuria was on 06/02/2019. Urinalysis on 06/07/2019 showed leukocytes 1+ and trace amounts of ketones and RBC. Microscopic  examination showed WBC 6-10, a few bacteria, and mucus and yeast were present. Urine culture revealed mixed urogenital flora.  Cystoscopy on 06/13/2019 was normal.   Labs followed: 05/30/2019: Hematocrit 40.3, hemoglobin 12.8, MCV 106.7, platelets 142,000, WBC 245,600 (ANC 4,300; ALC 239,200). CMP was normal.  06/01/2019: Hematocrit 43.5, hemoglobin 13.5, MCV 103.3, platelets 151,000, WBC 256,400 (ANC  5,100; ALC 246,100). PTT was 27. INR was 1.0.  06/04/2019: Hematocrit 43.3, hemoglobin 12.8, MCV 104.3, platelets 146,000, WBC 220,400 (ANC 4,000; ALC 214,900).   During Katherine interim, she has ben doing well. She has no more hematuria after she stopped Ibrutinib on 05/30/2019. Patient would like to remain off ibrutinib. She continues to bilateral ankle and feet swelling. Her diarrhea is resolved. She has no abdominal pain. Her arthritis is stable. Patient is not on any aspirin.    Past Medical History:  Diagnosis Date  . Allergy   . Anxiety   . Depression   . GERD (gastroesophageal reflux disease)   . Hemorrhoids   . Hypertension   . Leukemia, lymphoid ([place])    CLL  . Lobar pneumonia ([place])   . Osteopenia   . Personal history of chemotherapy     Past Surgical History:  Procedure Laterality Date  . ABDOMINAL HYSTERECTOMY    . APPENDECTOMY    . BREAST BIOPSY Left    bx x 3-neg  . COLON SURGERY     sigmoid resection  . COLONOSCOPY     2007, 2012  . COLONOSCOPY WITH PROPOFOL N/A 09/17/2015   Procedure: COLONOSCOPY WITH PROPOFOL;  Surgeon: Robert Bellow, MD;  Location[facility] ENDOSCOPY;  Service: Endoscopy;  Laterality: N/A;  . OOPHORECTOMY    . SPINE SURGERY  L4-5    Family History  Problem Relation Age of Onset  . Osteoporosis Mother   . Hypertension Father   . Heart attack Father   . Stroke Maternal Grandfather   . Breast cancer Sister 12    Social History:  reports that she has been smoking cigarettes. She has a 8.75 pack-year smoking history. She has never used  smokeless tobacco. She reports that she does not drink alcohol or use drugs. Currently smokes 5 cigarettes daily, but is working toward quitting. She previously smoked a pack a day for a "longtime".She lives in [name] patient is accompanied by Tammy via [place] today.  Allergies:  Allergies  Allergen Reactions  . Augmentin [Amoxicillin-Pot Clavulanate] Swelling    tongue  . Biaxin [Clarithromycin] Swelling and Rash    tongue    Current Medications: Current Outpatient Medications  Medication Sig Dispense Refill  . atorvastatin (LIPITOR) 10 MG tablet Take 1 tablet (10 mg total) by mouth daily. 90 tablet 1  . benazepril (LOTENSIN) 40 MG tablet Take 1 tablet (40 mg total) by mouth daily. 90 tablet 1  . Multiple Vitamins-Minerals (MULTIVITAMIN WITH MINERALS) tablet Take 1 tablet by mouth daily.    Marland Kitchen omeprazole (PRILOSEC) 20 MG capsule Take 1 capsule (20 mg total) by mouth daily as needed. 90 capsule 1  . PARoxetine (PAXIL) 30 MG tablet Take 1 tablet (30 mg total) by mouth daily. 90 tablet 1  . Triamcinolone Acetonide (NASACORT AQ NA) Place 2 Doses into Katherine nose daily as needed.     . diphenhydrAMINE (BENADRYL) 25 MG tablet Take 25 mg by mouth at bedtime as needed.    Marland Kitchen LORazepam (ATIVAN) 1 MG tablet Take 0.5 tablets (0.5 mg total) by mouth 2 (two) times daily as needed for anxiety. (Patient not taking: Reported on 06/19/2019) 30 tablet 5   No current facility-administered medications for this visit.    Review of Systems  Constitutional: Negative for chills, diaphoresis, fever, malaise/fatigue and weight loss (stable).       Feels good.  HENT: Negative.  Negative for congestion, ear pain, hearing loss, nosebleeds, sinus pain and sore throat.   Eyes: Negative.  Negative for blurred vision, double vision, photophobia and pain.  Respiratory: Negative.  Negative for cough, hemoptysis, sputum production and shortness of breath.   Cardiovascular: Positive for leg swelling (ankles and  feet). Negative for chest pain, palpitations, orthopnea and PND.  Gastrointestinal: Negative for abdominal pain, blood in stool, constipation, diarrhea (after starting Ibrutinib; on anti diarrhea tablets; resolved), heartburn, melena, nausea and vomiting.       Diverticulitis. Sensitive GI tract.  Genitourinary: Negative.  Negative for dysuria, frequency, hematuria and urgency.       Hematuria stopped after discontinuation of ibrutinib.  Musculoskeletal: Negative for back pain, joint pain (arthritis; stable), myalgias and neck pain.  Skin: Negative.  Negative for itching and rash.  Neurological: Negative for dizziness, tingling, speech change, focal weakness, weakness and headaches.  Endo/Heme/Allergies: Negative for environmental allergies. Does not bruise/bleed easily.  Psychiatric/Behavioral: Negative for depression and memory loss. Katherine patient is not nervous/anxious and does not have insomnia.   All other systems reviewed and are negative.  Performance status (ECOG): 1  Vitals Blood pressure (!) 149/70, pulse 75, temperature (!) 96.3 F (35.7 C), temperature source Tympanic, resp. rate 18, weight 156 lb 8.4 oz (71 kg), SpO2 96 %.   Physical Exam  Constitutional: She is oriented to person, place, and time. She appears well-developed and well-nourished. No distress.  HENT:  Head: Normocephalic and atraumatic.  Mouth/Throat: Oropharynx is clear and moist. No oropharyngeal exudate.  Short gray hair.  Mask.  Eyes: Pupils are equal, round, and reactive to light. Conjunctivae and EOM are normal. No scleral icterus.  Glasses.  Brown eyes.  Neck: No JVD present.  Cardiovascular: Normal rate, regular rhythm and normal heart sounds. Exam reveals no gallop and no friction rub.  No murmur heard. Pulmonary/Chest: Effort normal and breath sounds normal. No respiratory distress. She has no wheezes. She has no rales.  Abdominal: Soft. Bowel sounds are normal. She exhibits no distension and no mass.  There is no abdominal tenderness. There is no rebound and no guarding.  Musculoskeletal:        General: Edema (chronic bilateral ankle and feet) present. No tenderness. Normal range of motion.     Cervical back: Normal range of motion and neck supple.  Lymphadenopathy:       Head (right side): No preauricular, no posterior auricular and no occipital adenopathy present.       Head (left side): No preauricular, no posterior auricular and no occipital adenopathy present.    She has no cervical adenopathy.    She has no axillary adenopathy.       Right: No supraclavicular adenopathy present.       Left: No supraclavicular adenopathy present.  Neurological: She is alert and oriented to person, place, and time.  Skin: Skin is warm and dry. No ecchymosis and no rash noted. She is not diaphoretic. No erythema. No pallor.  Psychiatric: She has a normal mood and affect. Her behavior is normal. Judgment and thought content normal.  Nursing note and vitals reviewed.   Appointment on 06/19/2019  Component Date Value Ref Range Status  . LDH 06/19/2019 132  98 - 192 U/L Final   Performed at [facility], [address], [place], [place] [address]  . Uric Acid, Serum 06/19/2019 5.1  2.5 - 7.1 mg/dL Final   Performed at [facility], [address], [place], [place] [address]  . WBC 06/19/2019 81.4* 4.0 - 10.5 K/uL Final  . RBC 06/19/2019 4.38  3.87 - 5.11 MIL/uL Final  . Hemoglobin 06/19/2019 14.1  12.0 - 15.0 g/dL Final  . HCT 06/19/2019 44.0  36.0 - 46.0 % Final  . MCV 06/19/2019 100.5* 80.0 - 100.0 fL Final  . MCH 06/19/2019 32.2  26.0 - 34.0 pg Final  . MCHC 06/19/2019 32.0  30.0 - 36.0 g/dL Final  . RDW 06/19/2019 13.1  11.5 - 15.5 % Final  . Platelets 06/19/2019 141* 150 - 400 K/uL Final  . nRBC 06/19/2019 0.0  0.0 - 0.2 % Final  . Neutrophils Relative % 06/19/2019 4  % Final  . Neutro Abs 06/19/2019 3.4  1.7 - 7.7 K/uL Final  . Lymphocytes Relative 06/19/2019 93   % Final  . Lymphs Abs 06/19/2019 75.5* 0.7 - 4.0 K/uL Final  . Monocytes Relative 06/19/2019 3  % Final  . Monocytes Absolute 06/19/2019 2.2* 0.1 - 1.0 K/uL Final  . Eosinophils Relative 06/19/2019 0  % Final  . Eosinophils Absolute 06/19/2019 0.2  0.0 - 0.5 K/uL Final  . Basophils Relative 06/19/2019 0  % Final  . Basophils Absolute 06/19/2019 0.1  0.0 - 0.1 K/uL Final  . Immature Granulocytes 06/19/2019 0  % Final  . Abs Immature Granulocytes 06/19/2019 0.11* 0.00 - 0.07 K/uL Final   Performed at [facility], [address], [place],  [place] [address]  . Sodium 06/19/2019 139  135 - 145 mmol/L Final  . Potassium 06/19/2019 4.3  3.5 - 5.1 mmol/L Final  . Chloride 06/19/2019 103  98 - 111 mmol/L Final  . CO2 06/19/2019 28  22 - 32 mmol/L Final  . Glucose, Bld 06/19/2019 109* 70 - 99 mg/dL Final   Glucose reference range applies only to samples taken after fasting for at least 8 hours.  . BUN 06/19/2019 11  8 - 23 mg/dL Final  . Creatinine, Ser 06/19/2019 0.76  0.44 - 1.00 mg/dL Final  . Calcium 06/19/2019 9.4  8.9 - 10.3 mg/dL Final  . GFR calc non Af Amer 06/19/2019 >60  >60 mL/min Final  . GFR calc Af Amer 06/19/2019 >60  >60 mL/min Final  . Anion gap 06/19/2019 8  5 - 15 Final   Performed at [facility], [address], [place], [place] [address]    Assessment:  Christol [name] is a 79 y.o. female with chronic lymphocytic leukemia(CLL). WBC has ranged between 22,000 - 101,7000 since 05/2011.  She has received Rituxanx 2 four week cycles (06/27/2015 and 05/27/2017). Hepatitis B surface antigen and hepatitis B surface antibody were negative on 07/04/2015.  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  CCND1, ATM, chromosome 12, and TP53 were normal.   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  There was a CD5  and CD23 positive monoclonal B cell population with lambda light chain restriction, negative for FMC7 and CD38, representing  88% of leukocytes, >5,000/uL. There was no loss of, or aberrant expression of, Katherine pan T cell  antigens to  suggest a neoplastic T cell process. CD4:CD8 ratio 2.0  No circulating blasts were detected. There was no immunophenotypic evidence of abnormal myeloid maturation.  IGH/BCL2 by FISH revealed 86.5% of nuclei positive for 3 IgH signals.  There were 2BCL2 signals. Katherine IGH/BCL2 fusion signals associated with follicular lymphoma, and to a lesser extent diffuse large cell  lymphoma, were not observed. Katherine presence of 3 IGH  signals suggests an IGH rearrangement with a gene other than BCL2.  Ibrutinib was started on 04/24/2019.  WBC has been followed: 107,200 on 04/24/2019, 206,800 on 04/30/2019, 184,700 on 05/07/2019, 209,500 on 05/15/2019 and 237,000 on 05/21/2019.  She developed hematuria. CT hematuria work up on 06/01/2019 was negative.  Cystoscopy on 06/13/2019 was normal.  Her last episode of gross hematuria was on 06/02/2019. Urinalysis on 06/07/2019 showed leukocytes 1+ and trace amounts of ketones and RBC. Urine culture revealed mixed urogenital flora.   She has a 35 pack year smoking history. She is in Katherine low dose chest CT program. Low dose chest CTon 02/02/2018 revealed a new endobronchial lesionin Katherine segmental bronchus to Katherine medial segment of Katherine right middle lobe. This may simply reflect an area of retained secretions, however, Katherine possibility of an endobronchial neoplasm should be considered. Lung-RADS 4AS, suspicious.Low dose chest CTon 05/13/2020revealed Lung-RADS 2, benign appearance or behavior. Prior right middle lobe endobronchial lesion/mucous plugging wasno longer visualized. New mucous plugging in Katherine right lower lobe.  She received her first dose of [place] [employer] COVID-19 vaccine on 05/11/2019.  Symptomatically, she is doing well since  discontinuation of ibrutinib. Her hematuria has resolved.  Plan: 1.   Labs today: CBC with diff, BMP, uric acid, LDH. 2. Chronic lymphocytic leukemia (CLL) Clinically, she is doing well. Exam  reveals no adenopathy or hepatosplenomegaly. 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  CCND1, ATM, chromosome 12, and TP53 were normal. She began ibrutinib on 04/24/2019 (discontinued on 05/30/2019).   She developed blood-streaked sputum (slight) then significant hematuria.   Symptoms have resolved since discontinuation of ibrutinib.  Discuss following WBC initiating therapy later this year based on stability of counts.   She remains asymptomatic.  Multiple questions were asked and answered.      3. Hematuria  CT hematuria work up on 06/01/2019 was negative.  Cystoscopy on 06/13/2019 was normal.   Patient noted to have a history of grade I epistaxis and hemoptysis after coughing.  Ibrutinib resulted in bleeding in approximately 40% of patients.  Package insert notes stopping therapy for grade 4 hematologic toxicities are grade 3-4 nonhematologic toxicities.  Restart therapy once returns to grade 1.  Hematuria toxicity: Grade 1-microscopic only; grade 2-intermittent gross bleeding no clots; grade 3 persistent gross bleeding or clots and possibly requiring transfusion.  Patient opted to discontinue ibrutinib with now complete resolution of hematuria. 4.   Health maintenance Patientisinthelung cancer screening program. Low-dose chest CT on05/13/2020 was benign.  Anticipate next annual low-dose chest CT on 07/05/2019. Bilateral mammogram on 11/23/2020revealedno evidence of malignancy.                         Continue yearly mammogram. 4.   RTC monthly x 2 for labs (CBC with  diff). 5.   RTC in 3 months for MD assessment, labs (CBC with diff, CMP, LDH, uric acid).  I discussed Katherine assessment and treatment plan with Katherine patient.  Katherine patient was provided an opportunity to ask questions and all were answered.  Katherine patient agreed with Katherine plan and demonstrated an understanding of Katherine instructions.  Katherine patient was advised to call back if Katherine symptoms worsen or if Katherine condition fails to improve as anticipated.  I provided 22 minutes of face-to-face time during this this encounter and > 50% was spent counseling as documented under my assessment and plan. An additional 10 minutes were spent reviewing her chart (Epic and Care Everywhere) including notes, labs, and imaging studies.    Lequita Asal, MD, PhD    06/19/2019, 11:24 AM  I, Selena Batten, am acting as scribe for [employer]. Mike Gip, MD, PhD.  I, Naithen Rivenburg C. Mike Gip, MD, have reviewed Katherine above documentation for accuracy and completeness, and I agree with Katherine above.

## 2019-06-19 ENCOUNTER — Encounter: Payer: Self-pay | Admitting: Hematology and Oncology

## 2019-06-19 ENCOUNTER — Inpatient Hospital Stay: Payer: Medicare HMO

## 2019-06-19 ENCOUNTER — Inpatient Hospital Stay: Payer: Medicare HMO | Admitting: Hematology and Oncology

## 2019-06-19 ENCOUNTER — Other Ambulatory Visit: Payer: Self-pay

## 2019-06-19 VITALS — BP 149/70 | HR 75 | Temp 96.3°F | Resp 18 | Wt 156.5 lb

## 2019-06-19 DIAGNOSIS — Z7189 Other specified counseling: Secondary | ICD-10-CM

## 2019-06-19 DIAGNOSIS — C9110 Chronic lymphocytic leukemia of B-cell type not having achieved remission: Secondary | ICD-10-CM

## 2019-06-19 DIAGNOSIS — R319 Hematuria, unspecified: Secondary | ICD-10-CM | POA: Diagnosis not present

## 2019-06-19 DIAGNOSIS — Z803 Family history of malignant neoplasm of breast: Secondary | ICD-10-CM | POA: Diagnosis not present

## 2019-06-19 DIAGNOSIS — R310 Gross hematuria: Secondary | ICD-10-CM | POA: Diagnosis not present

## 2019-06-19 DIAGNOSIS — Z90710 Acquired absence of both cervix and uterus: Secondary | ICD-10-CM | POA: Diagnosis not present

## 2019-06-19 DIAGNOSIS — R69 Illness, unspecified: Secondary | ICD-10-CM | POA: Diagnosis not present

## 2019-06-19 LAB — BASIC METABOLIC PANEL
Anion gap: 8 (ref 5–15)
BUN: 11 mg/dL (ref 8–23)
CO2: 28 mmol/L (ref 22–32)
Calcium: 9.4 mg/dL (ref 8.9–10.3)
Chloride: 103 mmol/L (ref 98–111)
Creatinine, Ser: 0.76 mg/dL (ref 0.44–1.00)
GFR calc Af Amer: >60 mL/min (ref >60)
GFR calc non Af Amer: >60 mL/min (ref >60)
Glucose, Bld: 109 mg/dL — ABNORMAL HIGH (ref 70–99)
Potassium: 4.3 mmol/L (ref 3.5–5.1)
Sodium: 139 mmol/L (ref 135–145)

## 2019-06-19 LAB — CBC WITH DIFFERENTIAL/PLATELET
Abs Immature Granulocytes: 0.11 10*3/uL — ABNORMAL HIGH (ref 0.00–0.07)
Basophils Absolute: 0.1 10*3/uL (ref 0.0–0.1)
Basophils Relative: 0 %
Eosinophils Absolute: 0.2 10*3/uL (ref 0.0–0.5)
Eosinophils Relative: 0 %
HCT: 44 % (ref 36.0–46.0)
Hemoglobin: 14.1 g/dL (ref 12.0–15.0)
Immature Granulocytes: 0 %
Lymphocytes Relative: 93 %
Lymphs Abs: 75.5 10*3/uL — ABNORMAL HIGH (ref 0.7–4.0)
MCH: 32.2 pg (ref 26.0–34.0)
MCHC: 32 g/dL (ref 30.0–36.0)
MCV: 100.5 fL — ABNORMAL HIGH (ref 80.0–100.0)
Monocytes Absolute: 2.2 10*3/uL — ABNORMAL HIGH (ref 0.1–1.0)
Monocytes Relative: 3 %
Neutro Abs: 3.4 10*3/uL (ref 1.7–7.7)
Neutrophils Relative %: 4 %
Platelets: 141 10*3/uL — ABNORMAL LOW (ref 150–400)
RBC: 4.38 MIL/uL (ref 3.87–5.11)
RDW: 13.1 % (ref 11.5–15.5)
WBC: 81.4 10*3/uL (ref 4.0–10.5)
nRBC: 0 % (ref 0.0–0.2)

## 2019-06-19 LAB — URIC ACID: Uric Acid, Serum: 5.1 mg/dL (ref 2.5–7.1)

## 2019-06-19 LAB — LACTATE DEHYDROGENASE: LDH: 132 U/L (ref 98–192)

## 2019-06-19 NOTE — Progress Notes (Signed)
Patient states that she is doing well. Has no more blood in urine and is off of chemo pills.

## 2019-07-07 ENCOUNTER — Other Ambulatory Visit: Payer: Self-pay | Admitting: Family Medicine

## 2019-07-07 NOTE — Telephone Encounter (Signed)
Requested Prescriptions  Pending Prescriptions Disp Refills  . atorvastatin (LIPITOR) 10 MG tablet [Pharmacy Med Name: ATORVASTATIN 10 MG TABLET] 90 tablet 1    Sig: TAKE 1 TABLET BY MOUTH EVERY DAY     Cardiovascular:  Antilipid - Statins Failed - 07/07/2019 10:09 AM      Failed - LDL in normal range and within 360 days    LDL Chol Calc (NIH)  Date Value Ref Range Status  01/11/2019 64 0 - 99 mg/dL Final         Passed - Total Cholesterol in normal range and within 360 days    Cholesterol, Total  Date Value Ref Range Status  01/11/2019 129 100 - 199 mg/dL Final   Cholesterol Piccolo, Waived  Date Value Ref Range Status  03/09/2016 119 <200 mg/dL Final    Comment:                            Desirable                <200                         Borderline High      200- 239                         High                     >239          Passed - HDL in normal range and within 360 days    HDL  Date Value Ref Range Status  01/11/2019 41 >39 mg/dL Final         Passed - Triglycerides in normal range and within 360 days    Triglycerides  Date Value Ref Range Status  01/11/2019 137 0 - 149 mg/dL Final   Triglycerides Piccolo,Waived  Date Value Ref Range Status  03/09/2016 273 (H) <150 mg/dL Final    Comment:                            Normal                   <150                         Borderline High     150 - 199                         High                200 - 499                         Very High                >499          Passed - Patient is not pregnant      Passed - Valid encounter within last 12 months    Recent Outpatient Visits          3 months ago [place], [place], DO   5 months ago Essential hypertension   [place], [place], [place]   10 months  ago CLL (chronic lymphocytic leukemia) ([place])   [place], [place] T, NP   10 months ago Cellulitis of buttock   [facility] [place], [place] T, NP   10 months ago [place], Lilia [place], [place]      Future Appointments            In 4 days [place], Lilia Argue, [place], [place]   In 11 months [place], [place]

## 2019-07-11 ENCOUNTER — Other Ambulatory Visit: Payer: Self-pay

## 2019-07-11 ENCOUNTER — Ambulatory Visit (INDEPENDENT_AMBULATORY_CARE_PROVIDER_SITE_OTHER): Payer: Medicare HMO | Admitting: Family Medicine

## 2019-07-11 VITALS — BP 129/77 | HR 75 | Temp 98.2°F | Wt 158.0 lb

## 2019-07-11 DIAGNOSIS — J438 Other emphysema: Secondary | ICD-10-CM | POA: Diagnosis not present

## 2019-07-11 DIAGNOSIS — E7800 Pure hypercholesterolemia, unspecified: Secondary | ICD-10-CM

## 2019-07-11 DIAGNOSIS — J3089 Other allergic rhinitis: Secondary | ICD-10-CM | POA: Diagnosis not present

## 2019-07-11 DIAGNOSIS — C9110 Chronic lymphocytic leukemia of B-cell type not having achieved remission: Secondary | ICD-10-CM | POA: Diagnosis not present

## 2019-07-11 DIAGNOSIS — F3342 Major depressive disorder, recurrent, in full remission: Secondary | ICD-10-CM

## 2019-07-11 DIAGNOSIS — I700 Atherosclerosis of aorta: Secondary | ICD-10-CM

## 2019-07-11 DIAGNOSIS — I10 Essential (primary) hypertension: Secondary | ICD-10-CM

## 2019-07-11 DIAGNOSIS — R69 Illness, unspecified: Secondary | ICD-10-CM | POA: Diagnosis not present

## 2019-07-11 DIAGNOSIS — F419 Anxiety disorder, unspecified: Secondary | ICD-10-CM

## 2019-07-11 MED ORDER — PAROXETINE HCL 30 MG PO TABS: 30.0000 mg | ORAL_TABLET | Freq: Every day | ORAL | 1 refills | Status: DC

## 2019-07-11 MED ORDER — LORAZEPAM 1 MG PO TABS: 0.5000 mg | ORAL_TABLET | Freq: Two times a day (BID) | ORAL | 5 refills | Status: DC | PRN

## 2019-07-11 MED ORDER — OMEPRAZOLE 20 MG PO CPDR: 20.0000 mg | DELAYED_RELEASE_CAPSULE | Freq: Every day | ORAL | 1 refills | Status: DC | PRN

## 2019-07-11 MED ORDER — BENAZEPRIL HCL 40 MG PO TABS: 40.0000 mg | ORAL_TABLET | Freq: Every day | ORAL | 1 refills | Status: DC

## 2019-07-11 MED ORDER — ATORVASTATIN CALCIUM 10 MG PO TABS: 10.0000 mg | ORAL_TABLET | Freq: Every day | ORAL | 1 refills | Status: DC

## 2019-07-11 MED ORDER — ALBUTEROL SULFATE HFA 108 (90 BASE) MCG/ACT IN AERS
2.0000 | INHALATION_SPRAY | Freq: Four times a day (QID) | RESPIRATORY_TRACT | 0 refills | Status: DC | PRN
Start: 2019-07-11 — End: 2019-07-29

## 2019-07-11 NOTE — Assessment & Plan Note (Signed)
Followed closely by Hematology, continue per their recommendations

## 2019-07-11 NOTE — Assessment & Plan Note (Signed)
Exacerbated by allergies/inflammation. Declines maintenance inhaler, will start prn albuterol and continue remainder of regimen. F/u if not resolving

## 2019-07-11 NOTE — Assessment & Plan Note (Signed)
Currently exacerbated, recently started back on singulair. Continue this plus rest of allergy regimen consistently

## 2019-07-11 NOTE — Assessment & Plan Note (Signed)
Recheck lipids, adjust as needed. Continue current regimen 

## 2019-07-11 NOTE — Assessment & Plan Note (Signed)
Stable and well controlled on paxil and ativan 1/2 tab bid prn. Continue current regimen with close monitoring

## 2019-07-11 NOTE — Progress Notes (Signed)
BP 129/77   Pulse 75   Temp 98.2 F (36.8 C) (Oral)   Wt 158 lb (71.7 kg)   SpO2 95%   BMI 25.50 kg/m    Subjective:    Patient ID: Katherine [name], female    DOB: 09-01-1940, 79 y.o.   MRN: [id number]  HPI: Katherine [name] is a 79 y.o. female  Chief Complaint  Patient presents with  . Hypertension  . Hyperlipidemia  . Gastroesophageal Reflux   Here today for f/u chronic conditions  HTN - Home BPs running 120/70s range. Tolerating medicines well, taking faithfully. Denies CP, SOB, syncope  HLD - taking lipitor, no myalgias, claudication. Eating healthy, being active as tolerated  Taking allegra, singulair, and nasacort for her allergies, seems a bit flared today with some wheezing. Taking cough syrup prn as well. Known hx of emphysema, not on inhalers.   CLL - followed closely by Hematology. Has recently had some complications with chemo so currently off, taking a break prior to starting new/different regimen. Fatigued and weak at times but otherwise feeling in usual state of health.   Anxiety - well controlled on paxil and ativan regimen, tolerating well without sedation. Tends to need the ativan daily as written for max. Gets quite worried about her health conditions, particularly recently with complications with chemo.   Depression screen [facility] 2/9 07/11/2019 01/11/2019 07/11/2018  Decreased Interest - 1 0  Down, Depressed, Hopeless - 1 0  PHQ - 2 Score - 2 0  Altered sleeping 0 0 0  Tired, decreased energy 1 2 1   Change in appetite 0 0 0  Feeling bad or failure about yourself  0 0 0  Trouble concentrating 0 0 1  Moving slowly or fidgety/restless 0 0 1  Suicidal thoughts 0 0 0  PHQ-9 Score - 4 3  Difficult doing work/chores - Somewhat difficult Not difficult at all   GAD 7 : Generalized Anxiety Score 07/11/2019 07/11/2018 01/09/2018  Nervous, Anxious, on Edge 0 2 0  Control/stop worrying 0 1 0  Worry too much - different things 0 1 0  Trouble relaxing 0 0 0    Restless - 2 0  Easily annoyed or irritable - 1 0  Afraid - awful might happen - 0 0  Total GAD 7 Score - 7 0  Anxiety Difficulty - Not difficult at all -     Relevant past medical, surgical, family and social history reviewed and updated as indicated. Interim medical history since our last visit reviewed. Allergies and medications reviewed and updated.  Review of Systems  Per HPI unless specifically indicated above     Objective:    BP 129/77   Pulse 75   Temp 98.2 F (36.8 C) (Oral)   Wt 158 lb (71.7 kg)   SpO2 95%   BMI 25.50 kg/m   Wt Readings from Last 3 Encounters:  07/11/19 158 lb (71.7 kg)  06/19/19 156 lb 8.4 oz (71 kg)  06/13/19 157 lb (71.2 kg)    Physical Exam Vitals and nursing note reviewed.  Constitutional:      Appearance: Normal appearance. She is not ill-appearing.  HENT:     Head: Atraumatic.  Eyes:     Extraocular Movements: Extraocular movements intact.     Conjunctiva/sclera: Conjunctivae normal.  Cardiovascular:     Rate and Rhythm: Normal rate and regular rhythm.     Heart sounds: Normal heart sounds.  Pulmonary:     Effort: Pulmonary effort is normal.  Breath sounds: Wheezing (b/l diffuse) present.  Musculoskeletal:        General: Normal range of motion.     Cervical back: Normal range of motion and neck supple.  Skin:    General: Skin is warm and dry.  Neurological:     Mental Status: She is alert and oriented to person, place, and time.  Psychiatric:        Mood and Affect: Mood normal.        Thought Content: Thought content normal.        Judgment: Judgment normal.     Results for orders placed or performed in visit on 06/19/19  Lactate dehydrogenase  Result Value Ref Range   LDH 132 98 - 192 U/L  Uric acid  Result Value Ref Range   Uric Acid, Serum 5.1 2.5 - 7.1 mg/dL  CBC with Differential/Platelet  Result Value Ref Range   WBC 81.4 (HH) 4.0 - 10.5 K/uL   RBC 4.38 3.87 - 5.11 MIL/uL   Hemoglobin 14.1 12.0 -  15.0 g/dL   HCT 44.0 36.0 - 46.0 %   MCV 100.5 (H) 80.0 - 100.0 fL   MCH 32.2 26.0 - 34.0 pg   MCHC 32.0 30.0 - 36.0 g/dL   RDW 13.1 11.5 - 15.5 %   Platelets 141 (L) 150 - 400 K/uL   nRBC 0.0 0.0 - 0.2 %   Neutrophils Relative % 4 %   Neutro Abs 3.4 1.7 - 7.7 K/uL   Lymphocytes Relative 93 %   Lymphs Abs 75.5 (H) 0.7 - 4.0 K/uL   Monocytes Relative 3 %   Monocytes Absolute 2.2 (H) 0.1 - 1.0 K/uL   Eosinophils Relative 0 %   Eosinophils Absolute 0.2 0.0 - 0.5 K/uL   Basophils Relative 0 %   Basophils Absolute 0.1 0.0 - 0.1 K/uL   Immature Granulocytes 0 %   Abs Immature Granulocytes 0.11 (H) 0.00 - 0.07 K/uL  Basic metabolic panel  Result Value Ref Range   Sodium 139 135 - 145 mmol/L   Potassium 4.3 3.5 - 5.1 mmol/L   Chloride 103 98 - 111 mmol/L   CO2 28 22 - 32 mmol/L   Glucose, Bld 109 (H) 70 - 99 mg/dL   BUN 11 8 - 23 mg/dL   Creatinine, Ser 0.76 0.44 - 1.00 mg/dL   Calcium 9.4 8.9 - 10.3 mg/dL   GFR calc non Af Amer >60 >60 mL/min   GFR calc Af Amer >60 >60 mL/min   Anion gap 8 5 - 15      Assessment & Plan:   Problem List Items Addressed This Visit      Cardiovascular and Mediastinum   Essential hypertension - Primary   Relevant Medications   atorvastatin (LIPITOR) 10 MG tablet   benazepril (LOTENSIN) 40 MG tablet   Other Relevant Orders   Comprehensive metabolic panel   Atherosclerosis of aorta (HCC)   Relevant Medications   atorvastatin (LIPITOR) 10 MG tablet   benazepril (LOTENSIN) 40 MG tablet     Respiratory   Emphysema of lung (HCC)    Exacerbated by allergies/inflammation. Declines maintenance inhaler, will start prn albuterol and continue remainder of regimen. F/u if not resolving      Relevant Medications   Pseudoephedrine-DM-GG (ROBITUSSIN CF PO)   albuterol (VENTOLIN HFA) 108 (90 Base) MCG/ACT inhaler   Allergic rhinitis    Currently exacerbated, recently started back on singulair. Continue this plus rest of allergy regimen consistently  Other   Depression   Relevant Medications   LORazepam (ATIVAN) 1 MG tablet   PARoxetine (PAXIL) 30 MG tablet   CLL (chronic lymphocytic leukemia) (HCC)    Followed closely by Hematology, continue per their recommendations      Relevant Medications   LORazepam (ATIVAN) 1 MG tablet   Hypercholesterolemia    Recheck lipids, adjust as needed. Continue current regimen      Relevant Medications   atorvastatin (LIPITOR) 10 MG tablet   benazepril (LOTENSIN) 40 MG tablet   Other Relevant Orders   Lipid Panel w/o Chol/HDL Ratio   Anxiety    Stable and well controlled on paxil and ativan 1/2 tab bid prn. Continue current regimen with close monitoring      Relevant Medications   LORazepam (ATIVAN) 1 MG tablet   PARoxetine (PAXIL) 30 MG tablet       Follow up plan: Return in about 6 months (around 01/11/2020) for CPE.

## 2019-07-12 LAB — COMPREHENSIVE METABOLIC PANEL
ALT: 11 IU/L (ref 0–32)
AST: 21 IU/L (ref 0–40)
Albumin/Globulin Ratio: 3.5 — ABNORMAL HIGH (ref 1.2–2.2)
Albumin: 4.5 g/dL (ref 3.7–4.7)
Alkaline Phosphatase: 105 IU/L (ref 48–121)
BUN/Creatinine Ratio: 14 (ref 12–28)
BUN: 10 mg/dL (ref 8–27)
Bilirubin Total: 0.9 mg/dL (ref 0.0–1.2)
CO2: 26 mmol/L (ref 20–29)
Calcium: 9.4 mg/dL (ref 8.7–10.3)
Chloride: 103 mmol/L (ref 96–106)
Creatinine, Ser: 0.72 mg/dL (ref 0.57–1.00)
GFR calc Af Amer: 93 mL/min/{1.73_m2} (ref >59)
GFR calc non Af Amer: 80 mL/min/{1.73_m2} (ref >59)
Globulin, Total: 1.3 g/dL — ABNORMAL LOW (ref 1.5–4.5)
Glucose: 93 mg/dL (ref 65–99)
Potassium: 4.3 mmol/L (ref 3.5–5.2)
Sodium: 143 mmol/L (ref 134–144)
Total Protein: 5.8 g/dL — ABNORMAL LOW (ref 6.0–8.5)

## 2019-07-12 LAB — LIPID PANEL W/O CHOL/HDL RATIO
Cholesterol, Total: 123 mg/dL (ref 100–199)
HDL: 37 mg/dL — ABNORMAL LOW (ref >39)
LDL Chol Calc (NIH): 63 mg/dL (ref 0–99)
Triglycerides: 129 mg/dL (ref 0–149)
VLDL Cholesterol Cal: 23 mg/dL (ref 5–40)

## 2019-07-17 ENCOUNTER — Other Ambulatory Visit: Payer: Self-pay | Admitting: *Deleted

## 2019-07-17 DIAGNOSIS — C9110 Chronic lymphocytic leukemia of B-cell type not having achieved remission: Secondary | ICD-10-CM

## 2019-07-18 ENCOUNTER — Other Ambulatory Visit: Payer: Self-pay

## 2019-07-18 ENCOUNTER — Inpatient Hospital Stay: Payer: Medicare HMO | Attending: Hematology and Oncology

## 2019-07-18 ENCOUNTER — Telehealth: Payer: Self-pay

## 2019-07-18 DIAGNOSIS — C9110 Chronic lymphocytic leukemia of B-cell type not having achieved remission: Secondary | ICD-10-CM | POA: Diagnosis not present

## 2019-07-18 LAB — CBC WITH DIFFERENTIAL/PLATELET
Abs Immature Granulocytes: 0.09 10*3/uL — ABNORMAL HIGH (ref 0.00–0.07)
Basophils Absolute: 0.1 10*3/uL (ref 0.0–0.1)
Basophils Relative: 0 %
Eosinophils Absolute: 0.3 10*3/uL (ref 0.0–0.5)
Eosinophils Relative: 0 %
HCT: 44.6 % (ref 36.0–46.0)
Hemoglobin: 14.3 g/dL (ref 12.0–15.0)
Immature Granulocytes: 0 %
Lymphocytes Relative: 89 %
Lymphs Abs: 61.4 10*3/uL — ABNORMAL HIGH (ref 0.7–4.0)
MCH: 31.8 pg (ref 26.0–34.0)
MCHC: 32.1 g/dL (ref 30.0–36.0)
MCV: 99.3 fL (ref 80.0–100.0)
Monocytes Absolute: 4.4 10*3/uL — ABNORMAL HIGH (ref 0.1–1.0)
Monocytes Relative: 6 %
Neutro Abs: 3.4 10*3/uL (ref 1.7–7.7)
Neutrophils Relative %: 5 %
Platelets: 137 10*3/uL — ABNORMAL LOW (ref 150–400)
RBC: 4.49 MIL/uL (ref 3.87–5.11)
RDW: 12.8 % (ref 11.5–15.5)
WBC: 69.5 10*3/uL (ref 4.0–10.5)
nRBC: 0 % (ref 0.0–0.2)

## 2019-07-18 NOTE — Telephone Encounter (Signed)
Critical lab WBC 69.5 today, Dr Mike Gip aware

## 2019-07-29 ENCOUNTER — Other Ambulatory Visit: Payer: Self-pay | Admitting: Family Medicine

## 2019-07-29 NOTE — Telephone Encounter (Signed)
Requested Prescriptions  Pending Prescriptions Disp Refills  . albuterol (VENTOLIN HFA) 108 (90 Base) MCG/ACT inhaler [Pharmacy Med Name: ALBUTEROL HFA (VENTOLIN) INH] 18 g 0    Sig: TAKE 2 PUFFS BY MOUTH EVERY 6 HOURS AS NEEDED FOR WHEEZE OR SHORTNESS OF BREATH     Pulmonology:  Beta Agonists Failed - 07/29/2019  9:45 AM      Failed - One inhaler should last at least one month. If the patient is requesting refills earlier, contact the patient to check for uncontrolled symptoms.      Passed - Valid encounter within last 12 months    Recent Outpatient Visits          2 weeks ago Essential hypertension   [place], [place], [place]   4 months ago [place], [place], DO   6 months ago Essential hypertension   [place], [place], [place]   10 months ago CLL (chronic lymphocytic leukemia) ([place])   [place], [place] T, NP   11 months ago Cellulitis of buttock   [facility] Venita Lick, NP      Future Appointments            In 6 months [place] [facility], Lilia Argue, [place], [place]   In 10 months [place], [place]

## 2019-08-14 ENCOUNTER — Other Ambulatory Visit: Payer: Self-pay

## 2019-08-14 DIAGNOSIS — C9110 Chronic lymphocytic leukemia of B-cell type not having achieved remission: Secondary | ICD-10-CM

## 2019-08-14 DIAGNOSIS — R17 Unspecified jaundice: Secondary | ICD-10-CM

## 2019-08-15 ENCOUNTER — Other Ambulatory Visit: Payer: Self-pay

## 2019-08-15 ENCOUNTER — Inpatient Hospital Stay: Payer: Medicare HMO | Attending: Hematology and Oncology

## 2019-08-15 DIAGNOSIS — C9110 Chronic lymphocytic leukemia of B-cell type not having achieved remission: Secondary | ICD-10-CM

## 2019-08-15 DIAGNOSIS — R17 Unspecified jaundice: Secondary | ICD-10-CM

## 2019-08-15 LAB — BASIC METABOLIC PANEL
Anion gap: 9 (ref 5–15)
BUN: 11 mg/dL (ref 8–23)
CO2: 28 mmol/L (ref 22–32)
Calcium: 9.4 mg/dL (ref 8.9–10.3)
Chloride: 103 mmol/L (ref 98–111)
Creatinine, Ser: 0.83 mg/dL (ref 0.44–1.00)
GFR calc Af Amer: >60 mL/min (ref >60)
GFR calc non Af Amer: >60 mL/min (ref >60)
Glucose, Bld: 100 mg/dL — ABNORMAL HIGH (ref 70–99)
Potassium: 4.2 mmol/L (ref 3.5–5.1)
Sodium: 140 mmol/L (ref 135–145)

## 2019-08-15 LAB — CBC WITH DIFFERENTIAL/PLATELET
Abs Immature Granulocytes: 0.1 10*3/uL — ABNORMAL HIGH (ref 0.00–0.07)
Basophils Absolute: 0.1 10*3/uL (ref 0.0–0.1)
Basophils Relative: 0 %
Eosinophils Absolute: 0.3 10*3/uL (ref 0.0–0.5)
Eosinophils Relative: 0 %
HCT: 43.8 % (ref 36.0–46.0)
Hemoglobin: 14.6 g/dL (ref 12.0–15.0)
Immature Granulocytes: 0 %
Lymphocytes Relative: 92 %
Lymphs Abs: 73.4 10*3/uL — ABNORMAL HIGH (ref 0.7–4.0)
MCH: 31.9 pg (ref 26.0–34.0)
MCHC: 33.3 g/dL (ref 30.0–36.0)
MCV: 95.8 fL (ref 80.0–100.0)
Monocytes Absolute: 3 10*3/uL — ABNORMAL HIGH (ref 0.1–1.0)
Monocytes Relative: 4 %
Neutro Abs: 3.6 10*3/uL (ref 1.7–7.7)
Neutrophils Relative %: 4 %
Platelets: 130 10*3/uL — ABNORMAL LOW (ref 150–400)
RBC Morphology: NONE SEEN
RBC: 4.57 MIL/uL (ref 3.87–5.11)
RDW: 13.2 % (ref 11.5–15.5)
Smear Review: NORMAL
WBC: 80.5 10*3/uL (ref 4.0–10.5)
nRBC: 0 % (ref 0.0–0.2)

## 2019-08-15 LAB — LACTATE DEHYDROGENASE: LDH: 152 U/L (ref 98–192)

## 2019-08-15 LAB — URIC ACID: Uric Acid, Serum: 5 mg/dL (ref 2.5–7.1)

## 2019-08-18 ENCOUNTER — Other Ambulatory Visit: Payer: Self-pay | Admitting: Family Medicine

## 2019-08-18 NOTE — Telephone Encounter (Signed)
Requested Prescriptions  Pending Prescriptions Disp Refills  . albuterol (VENTOLIN HFA) 108 (90 Base) MCG/ACT inhaler [Pharmacy Med Name: ALBUTEROL HFA (VENTOLIN) INH] 18 g 0    Sig: TAKE 2 PUFFS BY MOUTH EVERY 6 HOURS AS NEEDED FOR WHEEZE OR SHORTNESS OF BREATH     Pulmonology:  Beta Agonists Failed - 08/18/2019  9:36 AM      Failed - One inhaler should last at least one month. If the patient is requesting refills earlier, contact the patient to check for uncontrolled symptoms.      Passed - Valid encounter within last 12 months    Recent Outpatient Visits          1 month ago Essential hypertension   [place], [place], [place]   5 months ago [place], [place], DO   7 months ago Essential hypertension   [place], [place], [place]   11 months ago CLL (chronic lymphocytic leukemia) ([place])   [place], [place] T, NP   12 months ago Cellulitis of buttock   [facility] Venita Lick, NP      Future Appointments            In 5 months [place] Des[place], Lilia Argue, [place], [place]   In 10 months [place], [place] [facility]

## 2019-08-20 ENCOUNTER — Telehealth: Payer: Self-pay | Admitting: Family Medicine

## 2019-08-20 NOTE — Telephone Encounter (Signed)
Copied from [place] [id number]. Topic: Medicare AWV >> Aug 20, 2019  1:40 PM Cher Nakai R wrote: Reason for CRM:  Left message for patient to call back and schedule the Medicare Annual Wellness Visit (AWV) virtually.  Last AWV 02/17/2018  Please schedule at anytime with [facility]-Nurse Health Advisor.  45 minute appointment Virtual

## 2019-08-29 ENCOUNTER — Telehealth: Payer: Self-pay

## 2019-08-29 NOTE — Telephone Encounter (Signed)
Message left notifying patient that it is time to schedule the low dose lung cancer screening CT scan.  Instructed patient to return call to Shawn Perkins at [phone] to verify information prior to CT scan being scheduled.    

## 2019-09-05 ENCOUNTER — Other Ambulatory Visit: Payer: Self-pay

## 2019-09-05 DIAGNOSIS — C9110 Chronic lymphocytic leukemia of B-cell type not having achieved remission: Secondary | ICD-10-CM

## 2019-09-07 ENCOUNTER — Other Ambulatory Visit: Payer: Self-pay | Admitting: Family Medicine

## 2019-09-11 NOTE — Progress Notes (Signed)
[facility]  [address], [address] [place], [place] [address] Phone: [phone]  Fax: [phone]   Clinic Day:  09/12/2019  Referring physician: No ref. provider found  Chief Complaint: Katherine [name] is a 79 y.o. female with chronic lymphocytic leukemia (CLL) who is seen for 3 month assessment.  HPI: The patient was last seen in the medical oncology clinic on 06/19/2019. At that time, Katherine [name] was doing well since discontinuation of ibrutinib. Her hematuria had resolved.  Followed labs: 06/19/2019: hematocrit 44.0, hemoglobin 14.1, MCV 100.5, platelets 141,000, WBC 81400, ANC 3400, ALC 61607. LDH 149.  07/18/2019: hematocrit 44.6, hemoglobin 14.3, MCV   99.3, platelets 137,000, WBC 69500, ANC 3400, ALC 37106. LDH 132.  08/15/2019: hematocrit 43.8, hemoglobin 14.6, MCV   95.8, platelets 130,000, WBC 80500, ANC 3600, ALC 26948. LDH 152.   During the interim, Katherine [name] has been well overall. Katherine [name] notes that Katherine [name] accidentally cut her leg some while shaving with her husband's electric razor blade, but Katherine [name] did not have issues bleeding like Katherine [name] did while Katherine [name] was on the ibrutinib.   Katherine [name] is fully vaccinated against COVID-19.    Past Medical History:  Diagnosis Date  . Allergy   . Anxiety   . Depression   . GERD (gastroesophageal reflux disease)   . Hemorrhoids   . Hypertension   . Leukemia, lymphoid ([place])    CLL  . Lobar pneumonia ([place])   . Osteopenia   . Personal history of chemotherapy     Past Surgical History:  Procedure Laterality Date  . ABDOMINAL HYSTERECTOMY    . APPENDECTOMY    . BREAST BIOPSY Left    bx x 3-neg  . COLON SURGERY     sigmoid resection  . COLONOSCOPY     2007, 2012  . COLONOSCOPY WITH PROPOFOL N/A 09/17/2015   Procedure: COLONOSCOPY WITH PROPOFOL;  Surgeon: Robert Bellow, MD;  Location: [facility] ENDOSCOPY;  Service: Endoscopy;  Laterality: N/A;  . OOPHORECTOMY    . SPINE SURGERY     L4-5    Family History  Problem Relation  Age of Onset  . Osteoporosis Mother   . Hypertension Father   . Heart attack Father   . Stroke Maternal Grandfather   . Breast cancer Sister 84    Social History:  reports that Katherine [name] has been smoking cigarettes. Katherine [name] has a 8.75 pack-year smoking history. Katherine [name] has never used smokeless tobacco. Katherine [name] reports that Katherine [name] does not drink alcohol and does not use drugs. Currently smokes 5 cigarettes daily, but is working toward quitting. Katherine [name] previously smoked a pack a day for a "longtime".Katherine [name] lives in [place], [place].The patient is alone today.   Allergies:  Allergies  Allergen Reactions  . Augmentin [Amoxicillin-Pot Clavulanate] Swelling    tongue  . Biaxin [Clarithromycin] Swelling and Rash    tongue    Current Medications: Current Outpatient Medications  Medication Sig Dispense Refill  . albuterol (VENTOLIN HFA) 108 (90 Base) MCG/ACT inhaler TAKE 2 PUFFS BY MOUTH EVERY 6 HOURS AS NEEDED FOR WHEEZE OR SHORTNESS OF BREATH 108 g 9  . atorvastatin (LIPITOR) 10 MG tablet Take 1 tablet (10 mg total) by mouth daily. 90 tablet 1  . benazepril (LOTENSIN) 40 MG tablet Take 1 tablet (40 mg total) by mouth daily. 90 tablet 1  . diphenhydrAMINE (BENADRYL) 25 MG tablet Take 25 mg by mouth at bedtime as needed.    Marland Kitchen LORazepam (ATIVAN) 1 MG tablet Take 0.5 tablets (0.5 mg total) by mouth  2 (two) times daily as needed for anxiety. 30 tablet 5  . Multiple Vitamins-Minerals (MULTIVITAMIN WITH MINERALS) tablet Take 1 tablet by mouth daily.    Marland Kitchen omeprazole (PRILOSEC) 20 MG capsule Take 1 capsule (20 mg total) by mouth daily as needed. 90 capsule 1  . PARoxetine (PAXIL) 30 MG tablet Take 1 tablet (30 mg total) by mouth daily. 90 tablet 1  . Pseudoephedrine-DM-GG (ROBITUSSIN CF PO) Take by mouth as needed.    . Triamcinolone Acetonide (NASACORT AQ NA) Place 2 Doses into the nose daily as needed.      No current facility-administered medications for this visit.    Review of Systems  Constitutional: Negative for  chills, diaphoresis, fever, malaise/fatigue and weight loss (stable).       Feels good.  HENT: Negative.  Negative for congestion, ear pain, hearing loss, nosebleeds and sinus pain.   Eyes: Negative.  Negative for blurred vision, double vision, photophobia and pain.  Respiratory: Positive for shortness of breath (has inhaler). Negative for cough, hemoptysis and sputum production.   Cardiovascular: Positive for leg swelling (ankles and feet). Negative for chest pain, palpitations, orthopnea and PND.  Gastrointestinal: Negative.  Negative for abdominal pain, blood in stool, constipation, diarrhea, heartburn, melena, nausea and vomiting.       Diverticulitis. Sensitive GI tract.  Genitourinary: Negative.  Negative for dysuria, frequency, hematuria and urgency.       No further hematuria.  Musculoskeletal: Negative for back pain, joint pain (arthritis; stable), myalgias and neck pain.  Skin: Negative.  Negative for itching and rash.  Neurological: Negative for dizziness, tingling, speech change, focal weakness, weakness and headaches.  Endo/Heme/Allergies: Positive for environmental allergies. Does not bruise/bleed easily.  Psychiatric/Behavioral: Negative for depression and memory loss. The patient is not nervous/anxious and does not have insomnia.   All other systems reviewed and are negative.  Performance status (ECOG): 1 - Symptomatic but completely ambulatory   Vitals Blood pressure (!) 126/56, pulse 85, temperature (!) 97 F (36.1 C), temperature source Tympanic, weight 155 lb 13.8 oz (70.7 kg), SpO2 95 %.   Physical Exam Vitals and nursing note reviewed.  Constitutional:      General: Katherine [name] is not in acute distress.    Appearance: Normal appearance. Katherine [name] is well-developed. Katherine [name] is not diaphoretic.  HENT:     Head: Normocephalic and atraumatic.     Comments: Short gray hair.    Mouth/Throat:     Pharynx: No oropharyngeal exudate.  Eyes:     General: No scleral icterus.     Conjunctiva/sclera: Conjunctivae normal.     Pupils: Pupils are equal, round, and reactive to light.     Comments: Glasses.  Brown eyes.  Neck:     Vascular: No JVD.  Cardiovascular:     Rate and Rhythm: Normal rate and regular rhythm.     Heart sounds: Normal heart sounds. No murmur heard.  No friction rub. No gallop.   Pulmonary:     Effort: Pulmonary effort is normal. No respiratory distress.     Breath sounds: Normal breath sounds. No wheezing or rales.  Abdominal:     General: Bowel sounds are normal. There is no distension.     Palpations: Abdomen is soft. There is no hepatomegaly, splenomegaly or mass.     Tenderness: There is no abdominal tenderness. There is no guarding or rebound.  Musculoskeletal:        General: No tenderness. Normal range of motion.     Cervical back: Normal  range of motion and neck supple.     Right lower leg: Edema (trace R>L) present.     Left lower leg: Edema (trace R>L) present.  Lymphadenopathy:     Head:     Right side of head: No preauricular, posterior auricular or occipital adenopathy.     Left side of head: No preauricular, posterior auricular or occipital adenopathy.     Cervical: No cervical adenopathy.     Upper Body:     Right upper body: No supraclavicular or axillary adenopathy.     Left upper body: No supraclavicular or axillary adenopathy.     Lower Body: No right inguinal adenopathy. No left inguinal adenopathy.  Skin:    General: Skin is warm and dry.     Coloration: Skin is not pale.     Findings: No ecchymosis, erythema or rash.  Neurological:     Mental Status: Katherine [name] is alert and oriented to person, place, and time.  Psychiatric:        Behavior: Behavior normal.        Thought Content: Thought content normal.        Judgment: Judgment normal.    Appointment on 09/12/2019  Component Date Value Ref Range Status  . LDH 09/12/2019 163  98 - 192 U/L Final   Performed at [facility], [address]  [place], [place] [address]  . WBC 09/12/2019 95.5* 4.0 - 10.5 K/uL Final   [facility] CRITICAL VALUE PROTOCOL  . RBC 09/12/2019 4.50  3.87 - 5.11 MIL/uL Final  . Hemoglobin 09/12/2019 14.5  12.0 - 15.0 g/dL Final  . HCT 09/12/2019 43.7  36 - 46 % Final  . MCV 09/12/2019 97.1  80.0 - 100.0 fL Final  . MCH 09/12/2019 32.2  26.0 - 34.0 pg Final  . MCHC 09/12/2019 33.2  30.0 - 36.0 g/dL Final  . RDW 09/12/2019 14.2  11.5 - 15.5 % Final  . Platelets 09/12/2019 152  150 - 400 K/uL Final  . nRBC 09/12/2019 0.0  0.0 - 0.2 % Final   Performed at [facility], [address] [place], [place] [address]  . Neutrophils Relative % 09/12/2019 PENDING  % Incomplete  . Neutro Abs 09/12/2019 PENDING  1.7 - 7.7 K/uL Incomplete  . Band Neutrophils 09/12/2019 PENDING  % Incomplete  . Lymphocytes Relative 09/12/2019 PENDING  % Incomplete  . Lymphs Abs 09/12/2019 PENDING  0.7 - 4.0 K/uL Incomplete  . Monocytes Relative 09/12/2019 PENDING  % Incomplete  . Monocytes Absolute 09/12/2019 PENDING  0 - 1 K/uL Incomplete  . Eosinophils Relative 09/12/2019 PENDING  % Incomplete  . Eosinophils Absolute 09/12/2019 PENDING  0 - 0 K/uL Incomplete  . Basophils Relative 09/12/2019 PENDING  % Incomplete  . Basophils Absolute 09/12/2019 PENDING  0 - 0 K/uL Incomplete  . WBC Morphology 09/12/2019 PENDING   Incomplete  . RBC Morphology 09/12/2019 PENDING   Incomplete  . Smear Review 09/12/2019 PENDING   Incomplete  . Other 09/12/2019 PENDING  % Incomplete  . nRBC 09/12/2019 PENDING  0 /100 WBC Incomplete  . Metamyelocytes Relative 09/12/2019 PENDING  % Incomplete  . Myelocytes 09/12/2019 PENDING  % Incomplete  . Promyelocytes Relative 09/12/2019 PENDING  % Incomplete  . Blasts 09/12/2019 PENDING  % Incomplete  . Immature Granulocytes 09/12/2019 PENDING  % Incomplete  . Abs Immature Granulocytes 09/12/2019 PENDING  0.00 - 0.07 K/uL Incomplete  . Sodium 09/12/2019 140  135 - 145 mmol/L Final  . Potassium  09/12/2019  4.4  3.5 - 5.1 mmol/L Final  . Chloride 09/12/2019 103  98 - 111 mmol/L Final  . CO2 09/12/2019 27  22 - 32 mmol/L Final  . Glucose, Bld 09/12/2019 100* 70 - 99 mg/dL Final   Glucose reference range applies only to samples taken after fasting for at least 8 hours.  . BUN 09/12/2019 13  8 - 23 mg/dL Final  . Creatinine, Ser 09/12/2019 0.94  0.44 - 1.00 mg/dL Final  . Calcium 09/12/2019 9.4  8.9 - 10.3 mg/dL Final  . Total Protein 09/12/2019 6.8  6.5 - 8.1 g/dL Final  . Albumin 09/12/2019 4.7  3.5 - 5.0 g/dL Final  . AST 09/12/2019 23  15 - 41 U/L Final  . ALT 09/12/2019 15  0 - 44 U/L Final  . Alkaline Phosphatase 09/12/2019 87  38 - 126 U/L Final  . Total Bilirubin 09/12/2019 1.0  0.3 - 1.2 mg/dL Final  . GFR calc non Af Amer 09/12/2019 58* >60 mL/min Final  . GFR calc Af Amer 09/12/2019 >60  >60 mL/min Final  . Anion gap 09/12/2019 10  5 - 15 Final   Performed at [facility], [address], [place], [place] [address]  . Uric Acid, Serum 09/12/2019 5.2  2.5 - 7.1 mg/dL Final   Performed at [facility], [address], [place], [place] [address]    Assessment:  Zendayah Hardgrave is a 79 y.o. female with chronic lymphocytic leukemia(CLL). WBC has ranged between 22,000 - 101,7000 since 05/2011.  Katherine [name] has received Rituxanx 2 four week cycles (06/27/2015 and 05/27/2017). Hepatitis B surface antigen and hepatitis B surface antibody were negative on 07/04/2015.  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  CCND1, ATM, chromosome 12, and TP53 were normal.   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  There was a CD5 and CD23 positive monoclonal B cell population with lambda light chain restriction, negative for FMC7 and CD38, representing  88% of leukocytes, >5,000/uL. There was no loss of, or aberrant expression of, the pan  T cell  antigens to  suggest a neoplastic T cell process. CD4:CD8 ratio 2.0  No circulating blasts were detected. There was no immunophenotypic evidence of abnormal myeloid maturation.  IGH/BCL2 by FISH revealed 86.5% of nuclei positive for 3 IgH signals.  There were 2BCL2 signals. The IGH/BCL2 fusion signals associated with follicular lymphoma, and to a lesser extent diffuse large cell  lymphoma, were not observed. The presence of 3 IGH  signals suggests an IGH rearrangement with a gene other than BCL2.  Katherine [name] received ibrutinib from 04/24/2019 - 05/30/2019.  Katherine [name] stopped ibrutinib secondary to hematuria.  WBC has been followed: 107,200 on 04/24/2019, 206,800 on 04/30/2019, 184,700 on 05/07/2019, 209,500 on 05/15/2019 and 237,000 on 05/21/2019.  Katherine [name] developed hematuria. CT hematuria work up on 06/01/2019 was negative.  Cystoscopy on 06/13/2019 was normal.  Her last episode of gross hematuria was on 06/02/2019. Urinalysis on 06/07/2019 showed leukocytes 1+ and trace amounts of ketones and RBC. Urine culture revealed mixed urogenital flora.   Katherine [name] has a 35 pack year smoking history. Katherine [name] is in the low dose chest CT program. Low dose chest CTon 02/02/2018 revealed a new endobronchial lesionin the segmental bronchus to the medial segment of the right middle lobe. This may simply reflect an area of retained secretions, however, the possibility of  an endobronchial neoplasm should be considered. Lung-RADS 4AS, suspicious.Low dose chest CTon 05/13/2020revealed Lung-RADS 2, benign appearance or behavior. Prior right middle lobe endobronchial lesion/mucous plugging wasno longer visualized. New mucous plugging in the right lower lobe.  Katherine [name] received her first dose of the [place] COVID-19 vaccine on 05/11/2019.  Symptomatically, Katherine [name] is doing well.  Katherine [name] denies any B symptoms.  Exam reveals no adenopathy or hepatosplenomegaly.  Plan: 1.   Labs today: CBC with diff, CMP, LDH, uric acid 2. Chronic  lymphocytic leukemia (CLL) Clinically, Katherine [name] continues to do well. Exam reveals no adenopathy or hepatosplenomegaly. 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  CCND1, ATM, chromosome 12, and TP53 were normal. Katherine [name] began ibrutinib on 04/24/2019 (discontinued on 05/30/2019).   Katherine [name] developed blood-streaked sputum (slight) then significant hematuria.   Katherine [name] declines further ibrutinib.  Hematocrit 43.7.  Hemoglobin 14.5.  MCV 97.1.  Platelets 152,000.  WBC 95,500.  Discuss plans for continued close monitoring of counts.  Potential treatment regimens were reviewed.   Katherine [name] tolerated Rituxan well in the past, discuss Rituxan based regimens.  Anticipate treatment later on this year based on lymphocyte doubling time. 3.   Health maintenance Patientisinthelung cancer screening program. Low-dose chest CT on05/13/2020 was benign.  Contact Melinda Crutch, RN regarding low-dose CT due 06/2019. Bilateral mammogram on 11/23/2020revealedno evidence of malignancy.                         Continue yearly mammogram. 4.   RTC monthly x 2 for labs (CBC with diff). 5.   RTC in 3 months for MD assessment, labs (CBC with diff, CMP, LDH, uric acid).  I discussed the assessment and treatment plan with the patient.  The patient was provided an opportunity to ask questions and all were answered.  The patient agreed with the plan and demonstrated an understanding of the instructions.  The patient was advised to call back if the symptoms worsen or if the condition fails to improve as anticipated.   Lequita Asal, MD, PhD    09/12/2019, 11:11 AM  I, Jacqualyn Posey, am acting as a Education administrator for [employer]. Mike Gip, MD.   I, Melissa C. Mike Gip, MD, have reviewed the above documentation for accuracy and  completeness, and I agree with the above.

## 2019-09-12 ENCOUNTER — Encounter: Payer: Self-pay | Admitting: Hematology and Oncology

## 2019-09-12 ENCOUNTER — Inpatient Hospital Stay: Payer: Medicare HMO

## 2019-09-12 ENCOUNTER — Other Ambulatory Visit: Payer: Self-pay

## 2019-09-12 ENCOUNTER — Telehealth: Payer: Self-pay | Admitting: Family Medicine

## 2019-09-12 ENCOUNTER — Other Ambulatory Visit: Payer: Self-pay | Admitting: *Deleted

## 2019-09-12 ENCOUNTER — Inpatient Hospital Stay: Payer: Medicare HMO | Attending: Hematology and Oncology | Admitting: Hematology and Oncology

## 2019-09-12 VITALS — BP 126/56 | HR 85 | Temp 97.0°F | Wt 155.9 lb

## 2019-09-12 DIAGNOSIS — C9110 Chronic lymphocytic leukemia of B-cell type not having achieved remission: Secondary | ICD-10-CM | POA: Diagnosis not present

## 2019-09-12 DIAGNOSIS — M8580 Other specified disorders of bone density and structure, unspecified site: Secondary | ICD-10-CM | POA: Insufficient documentation

## 2019-09-12 DIAGNOSIS — K219 Gastro-esophageal reflux disease without esophagitis: Secondary | ICD-10-CM | POA: Insufficient documentation

## 2019-09-12 DIAGNOSIS — F419 Anxiety disorder, unspecified: Secondary | ICD-10-CM | POA: Insufficient documentation

## 2019-09-12 DIAGNOSIS — I10 Essential (primary) hypertension: Secondary | ICD-10-CM | POA: Insufficient documentation

## 2019-09-12 DIAGNOSIS — Z8262 Family history of osteoporosis: Secondary | ICD-10-CM | POA: Insufficient documentation

## 2019-09-12 DIAGNOSIS — Z90710 Acquired absence of both cervix and uterus: Secondary | ICD-10-CM | POA: Insufficient documentation

## 2019-09-12 DIAGNOSIS — Z8249 Family history of ischemic heart disease and other diseases of the circulatory system: Secondary | ICD-10-CM | POA: Insufficient documentation

## 2019-09-12 DIAGNOSIS — R69 Illness, unspecified: Secondary | ICD-10-CM | POA: Diagnosis not present

## 2019-09-12 DIAGNOSIS — F17210 Nicotine dependence, cigarettes, uncomplicated: Secondary | ICD-10-CM | POA: Diagnosis not present

## 2019-09-12 DIAGNOSIS — Z79899 Other long term (current) drug therapy: Secondary | ICD-10-CM | POA: Diagnosis not present

## 2019-09-12 DIAGNOSIS — F329 Major depressive disorder, single episode, unspecified: Secondary | ICD-10-CM | POA: Insufficient documentation

## 2019-09-12 DIAGNOSIS — Z90721 Acquired absence of ovaries, unilateral: Secondary | ICD-10-CM | POA: Insufficient documentation

## 2019-09-12 DIAGNOSIS — Z7189 Other specified counseling: Secondary | ICD-10-CM | POA: Diagnosis not present

## 2019-09-12 DIAGNOSIS — Z803 Family history of malignant neoplasm of breast: Secondary | ICD-10-CM | POA: Diagnosis not present

## 2019-09-12 LAB — CBC WITH DIFFERENTIAL/PLATELET
Abs Immature Granulocytes: 0.11 10*3/uL — ABNORMAL HIGH (ref 0.00–0.07)
Basophils Absolute: 0.1 10*3/uL (ref 0.0–0.1)
Basophils Relative: 0 %
Eosinophils Absolute: 0.3 10*3/uL (ref 0.0–0.5)
Eosinophils Relative: 0 %
HCT: 43.7 % (ref 36.0–46.0)
Hemoglobin: 14.5 g/dL (ref 12.0–15.0)
Immature Granulocytes: 0 %
Lymphocytes Relative: 92 %
Lymphs Abs: 87.9 10*3/uL — ABNORMAL HIGH (ref 0.7–4.0)
MCH: 32.2 pg (ref 26.0–34.0)
MCHC: 33.2 g/dL (ref 30.0–36.0)
MCV: 97.1 fL (ref 80.0–100.0)
Monocytes Absolute: 3.5 10*3/uL — ABNORMAL HIGH (ref 0.1–1.0)
Monocytes Relative: 4 %
Neutro Abs: 3.7 10*3/uL (ref 1.7–7.7)
Neutrophils Relative %: 4 %
Platelets: 152 10*3/uL (ref 150–400)
RBC: 4.5 MIL/uL (ref 3.87–5.11)
RDW: 14.2 % (ref 11.5–15.5)
WBC: 95.5 10*3/uL (ref 4.0–10.5)
nRBC: 0 % (ref 0.0–0.2)

## 2019-09-12 LAB — COMPREHENSIVE METABOLIC PANEL
ALT: 15 U/L (ref 0–44)
AST: 23 U/L (ref 15–41)
Albumin: 4.7 g/dL (ref 3.5–5.0)
Alkaline Phosphatase: 87 U/L (ref 38–126)
Anion gap: 10 (ref 5–15)
BUN: 13 mg/dL (ref 8–23)
CO2: 27 mmol/L (ref 22–32)
Calcium: 9.4 mg/dL (ref 8.9–10.3)
Chloride: 103 mmol/L (ref 98–111)
Creatinine, Ser: 0.94 mg/dL (ref 0.44–1.00)
GFR calc Af Amer: >60 mL/min (ref >60)
GFR calc non Af Amer: 58 mL/min — ABNORMAL LOW (ref >60)
Glucose, Bld: 100 mg/dL — ABNORMAL HIGH (ref 70–99)
Potassium: 4.4 mmol/L (ref 3.5–5.1)
Sodium: 140 mmol/L (ref 135–145)
Total Bilirubin: 1 mg/dL (ref 0.3–1.2)
Total Protein: 6.8 g/dL (ref 6.5–8.1)

## 2019-09-12 LAB — LACTATE DEHYDROGENASE: LDH: 163 U/L (ref 98–192)

## 2019-09-12 LAB — URIC ACID: Uric Acid, Serum: 5.2 mg/dL (ref 2.5–7.1)

## 2019-09-12 NOTE — Telephone Encounter (Signed)
Copied from [place] [id number]. Topic: Medicare AWV >> Sep 12, 2019  2:22 PM Cher Nakai R wrote: Reason for CRM:  Left message for patient to call back and schedule the Medicare Annual Wellness Visit (AWV) virtually.  Last AWV 01/2018  Please schedule at anytime with [facility]-Nurse Health Advisor.  45 minute appointment

## 2019-09-12 NOTE — Progress Notes (Signed)
Patient here today for follow up. Denies any pain or concerns at this time.

## 2019-09-17 ENCOUNTER — Telehealth: Payer: Self-pay

## 2019-09-17 NOTE — Telephone Encounter (Signed)
Message left notifying patient that it is time to schedule the low dose lung cancer screening CT scan.  Instructed patient to return call to Shawn Perkins at [phone] to verify information prior to CT scan being scheduled.    

## 2019-09-21 ENCOUNTER — Telehealth: Payer: Self-pay | Admitting: *Deleted

## 2019-09-21 NOTE — Telephone Encounter (Signed)
Contacted regarding scheduling lung screening scan. Patient reports she is not interested at this time but has my # and will call me when she is interested.

## 2019-10-01 ENCOUNTER — Telehealth: Payer: Self-pay

## 2019-10-01 NOTE — Telephone Encounter (Signed)
Patient notified that lung screening imaging is due currently or in the near future. Patient's preference is to have the CT at the imaging center in [place].  Patient is a current smoker, smoking 0.25 packs per day the past year.

## 2019-10-02 ENCOUNTER — Other Ambulatory Visit: Payer: Self-pay | Admitting: *Deleted

## 2019-10-02 ENCOUNTER — Encounter: Payer: Self-pay | Admitting: Family Medicine

## 2019-10-02 DIAGNOSIS — Z87891 Personal history of nicotine dependence: Secondary | ICD-10-CM

## 2019-10-02 DIAGNOSIS — Z122 Encounter for screening for malignant neoplasm of respiratory organs: Secondary | ICD-10-CM

## 2019-10-11 ENCOUNTER — Ambulatory Visit
Admission: RE | Admit: 2019-10-11 | Discharge: 2019-10-11 | Disposition: A | Discharge status: Home / Self Care | Payer: Medicare HMO | Source: Ambulatory Visit | Attending: Nurse Practitioner | Admitting: Nurse Practitioner

## 2019-10-11 ENCOUNTER — Inpatient Hospital Stay: Payer: Medicare HMO | Attending: Hematology and Oncology | Admitting: Hematology and Oncology

## 2019-10-11 ENCOUNTER — Other Ambulatory Visit: Payer: Self-pay

## 2019-10-11 DIAGNOSIS — I2510 Atherosclerotic heart disease of native coronary artery without angina pectoris: Secondary | ICD-10-CM | POA: Diagnosis not present

## 2019-10-11 DIAGNOSIS — Z87891 Personal history of nicotine dependence: Secondary | ICD-10-CM

## 2019-10-11 DIAGNOSIS — F17200 Nicotine dependence, unspecified, uncomplicated: Secondary | ICD-10-CM | POA: Insufficient documentation

## 2019-10-11 DIAGNOSIS — Z122 Encounter for screening for malignant neoplasm of respiratory organs: Secondary | ICD-10-CM

## 2019-10-11 DIAGNOSIS — I700 Atherosclerosis of aorta: Secondary | ICD-10-CM | POA: Insufficient documentation

## 2019-10-11 DIAGNOSIS — J432 Centrilobular emphysema: Secondary | ICD-10-CM | POA: Diagnosis not present

## 2019-10-11 DIAGNOSIS — C9110 Chronic lymphocytic leukemia of B-cell type not having achieved remission: Secondary | ICD-10-CM | POA: Diagnosis not present

## 2019-10-11 DIAGNOSIS — R69 Illness, unspecified: Secondary | ICD-10-CM | POA: Diagnosis not present

## 2019-10-11 LAB — CBC WITH DIFFERENTIAL/PLATELET
Abs Immature Granulocytes: 0.15 10*3/uL — ABNORMAL HIGH (ref 0.00–0.07)
Basophils Absolute: 0.1 10*3/uL (ref 0.0–0.1)
Basophils Relative: 0 %
Eosinophils Absolute: 0.3 10*3/uL (ref 0.0–0.5)
Eosinophils Relative: 0 %
HCT: 43.6 % (ref 36.0–46.0)
Hemoglobin: 14.5 g/dL (ref 12.0–15.0)
Immature Granulocytes: 0 %
Lymphocytes Relative: 94 %
Lymphs Abs: 100.2 10*3/uL — ABNORMAL HIGH (ref 0.7–4.0)
MCH: 32.5 pg (ref 26.0–34.0)
MCHC: 33.3 g/dL (ref 30.0–36.0)
MCV: 97.8 fL (ref 80.0–100.0)
Monocytes Absolute: 3.3 10*3/uL — ABNORMAL HIGH (ref 0.1–1.0)
Monocytes Relative: 3 %
Neutro Abs: 3.6 10*3/uL (ref 1.7–7.7)
Neutrophils Relative %: 3 %
Platelets: 171 10*3/uL (ref 150–400)
RBC: 4.46 MIL/uL (ref 3.87–5.11)
RDW: 14.3 % (ref 11.5–15.5)
WBC: 107.5 10*3/uL (ref 4.0–10.5)
nRBC: 0 % (ref 0.0–0.2)

## 2019-10-11 LAB — COMPREHENSIVE METABOLIC PANEL
ALT: 13 U/L (ref 0–44)
AST: 22 U/L (ref 15–41)
Albumin: 4.5 g/dL (ref 3.5–5.0)
Alkaline Phosphatase: 96 U/L (ref 38–126)
Anion gap: 10 (ref 5–15)
BUN: 11 mg/dL (ref 8–23)
CO2: 26 mmol/L (ref 22–32)
Calcium: 9 mg/dL (ref 8.9–10.3)
Chloride: 106 mmol/L (ref 98–111)
Creatinine, Ser: 0.93 mg/dL (ref 0.44–1.00)
GFR calc Af Amer: >60 mL/min (ref >60)
GFR calc non Af Amer: 59 mL/min — ABNORMAL LOW (ref >60)
Glucose, Bld: 104 mg/dL — ABNORMAL HIGH (ref 70–99)
Potassium: 4.3 mmol/L (ref 3.5–5.1)
Sodium: 142 mmol/L (ref 135–145)
Total Bilirubin: 0.8 mg/dL (ref 0.3–1.2)
Total Protein: 6.8 g/dL (ref 6.5–8.1)

## 2019-10-15 ENCOUNTER — Telehealth (HOSPITAL_COMMUNITY): Payer: Self-pay | Admitting: Nurse Practitioner

## 2019-10-15 ENCOUNTER — Other Ambulatory Visit (HOSPITAL_COMMUNITY): Payer: Self-pay | Admitting: Nurse Practitioner

## 2019-10-15 ENCOUNTER — Telehealth: Payer: Self-pay | Admitting: *Deleted

## 2019-10-15 DIAGNOSIS — J9811 Atelectasis: Secondary | ICD-10-CM

## 2019-10-15 NOTE — Telephone Encounter (Signed)
  Do you know when she can be seen by pulmonary medicine?  M

## 2019-10-15 NOTE — Telephone Encounter (Signed)
Results of lung screening scan discussed with patient by both Beckey Rutter NP and myself. Referral made to pulmonology. Notification sent to pulmonologist.   IMPRESSION: 1. Lung-RADS 0S, incomplete. Study is limited by complete atelectasis in the left lower lobe on today's examination. Referral to Pulmonology for further evaluation is recommended. Once left lower lobe atelectasis has been confirmed by follow-up radiographs, repeat lung cancer screening CT should be performed. 2. The "S" modifier above refers to potentially clinically significant non lung cancer related findings. Specifically, Aortic atherosclerosis, in addition to 3 vessel coronary artery disease. Assessment for potential risk factor modification, dietary therapy or pharmacologic therapy may be warranted, if clinically indicated. 3. Mild diffuse bronchial wall thickening with mild centrilobular and paraseptal emphysema; imaging findings suggestive of underlying COPD.  Aortic Atherosclerosis (ICD10-I70.0) and Emphysema (ICD10-J43.9).

## 2019-10-15 NOTE — Telephone Encounter (Signed)
Discussed results of LDCT with patient including recommendation for ref to pulmonology. She is agreeable. Prefers [place] location if possible. Discussed with Burgess Estelle, RN.

## 2019-10-16 ENCOUNTER — Telehealth: Payer: Self-pay | Admitting: *Deleted

## 2019-10-16 NOTE — Telephone Encounter (Signed)
Appointment with pulmonologist is pending scheduling. Will send reminder to that office. Recommendation given to PCP from pulmonologist, if clinically indicated to treat with antibiotics and acapella valve to assist with secretions and with mucus plugging. PCP aware and will see patient soon.

## 2019-10-18 ENCOUNTER — Other Ambulatory Visit: Payer: Self-pay

## 2019-10-18 ENCOUNTER — Ambulatory Visit (INDEPENDENT_AMBULATORY_CARE_PROVIDER_SITE_OTHER): Payer: Medicare HMO | Admitting: Family Medicine

## 2019-10-18 ENCOUNTER — Encounter: Payer: Self-pay | Admitting: Family Medicine

## 2019-10-18 VITALS — BP 114/69 | HR 88 | Temp 98.1°F | Wt 155.0 lb

## 2019-10-18 DIAGNOSIS — J438 Other emphysema: Secondary | ICD-10-CM | POA: Diagnosis not present

## 2019-10-18 MED ORDER — DOXYCYCLINE HYCLATE 100 MG PO TABS: 100.0000 mg | ORAL_TABLET | Freq: Two times a day (BID) | ORAL | 0 refills | Status: DC

## 2019-10-18 NOTE — Assessment & Plan Note (Signed)
With severe atelectasis of left lower lung and diffuse congestion. Start doxycycline, work on ordering acapella valve. Discussed instructions for use, handout given and video watched together. Will try medical supply but discussed can also order independently

## 2019-10-18 NOTE — Patient Instructions (Signed)
USER INSTRUCTIONS FOR FIRST TIME USERS Set the Frequency-Adjusting Dial to its Minimal Setting (Turning Counterclockwise) Relax and then Breathe from Your Diaphragm Take in a Large Amount of Air - Do Not Fill Your Lungs to Capacity Blow out Through the Device without Industrial/product designer Make Sure Your Breath Lasts for 3 to 4 Seconds If You [employer] for That Long, Turn the Resistance up by Rotating the Dial Clockwise Your Outward Breath Should Last Three to Four Times Longer than an Inward Breath You May Need to Use the Device Several Times Before You Find the Proper Setting acapella oscillating therapy   Daily Use Instructions Verify That the Dial is Set to Where it Should Be Place Your Elbows on a Table, Making Sure That You are Sitting Comfortably Put the End of the Mouthpiece into Your Mouth Close Your Lips to Form a Tight Seal You May Need to Use a Nose Clip to Achieve the Seal If You are Using a Mask, Make Sure That the Mask Fits Firmly Over Your Nose and Mouth (The Mask Should be Comfortable) Draw a Large Breath in with Your Diaphram (Do Not Fill Your Lungs) Chief Strategy Officer for a Couple of Passenger transport manager, Glass blower/designer Through the Device without Industrial/product designer (This Should Take Three to Four Times Longer Than Breathing in Did) Take 10 - 20 Breaths Through Your Device (The Number Your Healthcare Provider Prescribed) Take the Mouthpiece out Draw in a Deep Breath of Air and Force it Out in Two or Three Quick "Huffs" as if You Were Blowing a Candle Out with Your Mouth Wide Open Repeat the Entire Procedure According to Your Provider's Directions   Sterilizing Your Unit Soak Your Device in One of the Following Three Solutions 70% Isopropyl Alcohol. Soaking Time - Five Minutes, Two Times per Day. Rinse Thoroughly in Sterile Water 3% Hydrogen Peroxide. Soaking Time - Thirty Minutes. Rinse Thoroughly in [employer] Glutaraldehydes. Use This Solution Cold acapella diagram

## 2019-10-18 NOTE — Progress Notes (Signed)
BP 114/69   Pulse 88   Temp 98.1 F (36.7 C) (Oral)   Wt 155 lb (70.3 kg)   SpO2 90%   BMI 25.02 kg/m    Subjective:    Patient ID: Katherine [name], female    DOB: October 30, 1940, 79 y.o.   MRN: [id number]  HPI: Katherine [name] is a 79 y.o. female  Chief Complaint  Patient presents with  . Follow-up    pt states was adivsed by pulmonologist to see PCP   Here today at the request of Pulmonology regarding her recent low dose screening CT chest, which showed complete atelectasis in left lower lobe in addition to her chronic emphysema findings. She was referred to Pulmonology for follow up but Pulmonology could not see her for weeks - it was recommended by them in the meantime that she see PCP to facilitate antibiotics and getting an acapella valve to help with secretions and lung function at home. Denies fever, severe SOB, CP.   Relevant past medical, surgical, family and social history reviewed and updated as indicated. Interim medical history since our last visit reviewed. Allergies and medications reviewed and updated.  Review of Systems  Per HPI unless specifically indicated above     Objective:    BP 114/69   Pulse 88   Temp 98.1 F (36.7 C) (Oral)   Wt 155 lb (70.3 kg)   SpO2 90%   BMI 25.02 kg/m   Wt Readings from Last 3 Encounters:  10/18/19 155 lb (70.3 kg)  09/12/19 155 lb 13.8 oz (70.7 kg)  07/11/19 158 lb (71.7 kg)    Physical Exam Vitals and nursing note reviewed.  Constitutional:      Appearance: Normal appearance. She is not ill-appearing.  HENT:     Head: Atraumatic.  Eyes:     Extraocular Movements: Extraocular movements intact.     Conjunctiva/sclera: Conjunctivae normal.  Cardiovascular:     Rate and Rhythm: Normal rate.     Heart sounds: Normal heart sounds.  Pulmonary:     Effort: Pulmonary effort is normal. No respiratory distress.     Breath sounds: Wheezing (diffuse) present.     Comments: Decreased breath sounds diffusely, worse  at left lung field Musculoskeletal:        General: Normal range of motion.     Cervical back: Normal range of motion and neck supple.  Skin:    General: Skin is warm and dry.  Neurological:     Mental Status: She is alert and oriented to person, place, and time.  Psychiatric:        Behavior: Behavior normal.        Thought Content: Thought content normal.        Judgment: Judgment normal.     Results for orders placed or performed in visit on 10/11/19  CBC with Differential/Platelet  Result Value Ref Range   WBC 107.5 (HH) 4.0 - 10.5 K/uL   RBC 4.46 3.87 - 5.11 MIL/uL   Hemoglobin 14.5 12.0 - 15.0 g/dL   HCT 43.6 36 - 46 %   MCV 97.8 80.0 - 100.0 fL   MCH 32.5 26.0 - 34.0 pg   MCHC 33.3 30.0 - 36.0 g/dL   RDW 14.3 11.5 - 15.5 %   Platelets 171 150 - 400 K/uL   nRBC 0.0 0.0 - 0.2 %   Neutrophils Relative % 3 %   Neutro Abs 3.6 1.7 - 7.7 K/uL   Lymphocytes Relative 94 %  Lymphs Abs 100.2 (H) 0.7 - 4.0 K/uL   Monocytes Relative 3 %   Monocytes Absolute 3.3 (H) 0 - 1 K/uL   Eosinophils Relative 0 %   Eosinophils Absolute 0.3 0 - 0 K/uL   Basophils Relative 0 %   Basophils Absolute 0.1 0 - 0 K/uL   Immature Granulocytes 0 %   Abs Immature Granulocytes 0.15 (H) 0.00 - 0.07 K/uL  Comprehensive metabolic panel  Result Value Ref Range   Sodium 142 135 - 145 mmol/L   Potassium 4.3 3.5 - 5.1 mmol/L   Chloride 106 98 - 111 mmol/L   CO2 26 22 - 32 mmol/L   Glucose, Bld 104 (H) 70 - 99 mg/dL   BUN 11 8 - 23 mg/dL   Creatinine, Ser 0.93 0.44 - 1.00 mg/dL   Calcium 9.0 8.9 - 10.3 mg/dL   Total Protein 6.8 6.5 - 8.1 g/dL   Albumin 4.5 3.5 - 5.0 g/dL   AST 22 15 - 41 U/L   ALT 13 0 - 44 U/L   Alkaline Phosphatase 96 38 - 126 U/L   Total Bilirubin 0.8 0.3 - 1.2 mg/dL   GFR calc non Af Amer 59 (L) >60 mL/min   GFR calc Af Amer >60 >60 mL/min   Anion gap 10 5 - 15      Assessment & Plan:   Problem List Items Addressed This Visit      Respiratory   Emphysema of lung  ([place]) - Primary    With severe atelectasis of left lower lung and diffuse congestion. Start doxycycline, work on ordering acapella valve. Discussed instructions for use, handout given and video watched together. Will try medical supply but discussed can also order independently          Follow up plan: Return for as scheduled.

## 2019-11-08 ENCOUNTER — Ambulatory Visit (INDEPENDENT_AMBULATORY_CARE_PROVIDER_SITE_OTHER): Payer: Medicare HMO

## 2019-11-08 ENCOUNTER — Inpatient Hospital Stay: Payer: Medicare HMO | Attending: Hematology and Oncology

## 2019-11-08 ENCOUNTER — Telehealth: Payer: Self-pay

## 2019-11-08 ENCOUNTER — Other Ambulatory Visit: Payer: Self-pay

## 2019-11-08 DIAGNOSIS — C9110 Chronic lymphocytic leukemia of B-cell type not having achieved remission: Secondary | ICD-10-CM | POA: Diagnosis not present

## 2019-11-08 DIAGNOSIS — Z23 Encounter for immunization: Secondary | ICD-10-CM

## 2019-11-08 LAB — CBC WITH DIFFERENTIAL/PLATELET
Abs Immature Granulocytes: 0.05 10*3/uL (ref 0.00–0.07)
Basophils Absolute: 0.1 10*3/uL (ref 0.0–0.1)
Basophils Relative: 0 %
Eosinophils Absolute: 0.2 10*3/uL (ref 0.0–0.5)
Eosinophils Relative: 0 %
HCT: 43.1 % (ref 36.0–46.0)
Hemoglobin: 14.5 g/dL (ref 12.0–15.0)
Immature Granulocytes: 0 %
Lymphocytes Relative: 89 %
Lymphs Abs: 74.1 10*3/uL — ABNORMAL HIGH (ref 0.7–4.0)
MCH: 32.8 pg (ref 26.0–34.0)
MCHC: 33.6 g/dL (ref 30.0–36.0)
MCV: 97.5 fL (ref 80.0–100.0)
Monocytes Absolute: 7.2 10*3/uL — ABNORMAL HIGH (ref 0.1–1.0)
Monocytes Relative: 9 %
Neutro Abs: 1.5 10*3/uL — ABNORMAL LOW (ref 1.7–7.7)
Neutrophils Relative %: 2 %
Platelets: 130 10*3/uL — ABNORMAL LOW (ref 150–400)
RBC Morphology: NONE SEEN
RBC: 4.42 MIL/uL (ref 3.87–5.11)
RDW: 13.7 % (ref 11.5–15.5)
WBC: 83.1 10*3/uL (ref 4.0–10.5)
nRBC: 0 % (ref 0.0–0.2)

## 2019-11-08 NOTE — Telephone Encounter (Signed)
Spoke with the patient to see if she was having any symptoms, she reports no. I have informed her that, Per Dr Mike Gip her las WBC 107,500 but today 83,100, I infomred her Per Dr Mike Gip her numbers can come down by there self. The patient was understanding and agreeable.

## 2019-11-09 ENCOUNTER — Other Ambulatory Visit: Payer: Self-pay | Admitting: *Deleted

## 2019-11-09 DIAGNOSIS — J9811 Atelectasis: Secondary | ICD-10-CM

## 2019-11-12 ENCOUNTER — Ambulatory Visit
Admission: RE | Admit: 2019-11-12 | Discharge: 2019-11-12 | Disposition: A | Discharge status: Home / Self Care | Payer: Medicare HMO | Attending: Nurse Practitioner | Admitting: Nurse Practitioner

## 2019-11-12 ENCOUNTER — Ambulatory Visit
Admission: RE | Admit: 2019-11-12 | Discharge: 2019-11-12 | Disposition: A | Discharge status: Home / Self Care | Payer: Medicare HMO | Source: Ambulatory Visit | Attending: Nurse Practitioner | Admitting: Nurse Practitioner

## 2019-11-12 ENCOUNTER — Other Ambulatory Visit: Payer: Self-pay

## 2019-11-12 DIAGNOSIS — J9811 Atelectasis: Secondary | ICD-10-CM

## 2019-11-12 DIAGNOSIS — J439 Emphysema, unspecified: Secondary | ICD-10-CM | POA: Diagnosis not present

## 2019-12-05 NOTE — Progress Notes (Signed)
[facility]  [address], [address] [place], [place] [address] Phone: [phone]  Fax: [phone]   Clinic Day:  12/06/2019  Referring physician: Volney American,*  Chief Complaint: Katherine [name] is a 79 y.o. female with chronic lymphocytic leukemia (CLL) who is seen for 3 month assessment.  HPI: The patient was last seen in the medical oncology clinic on 09/12/2019. At that time, she was doing well.  She denied any B symptoms.  Exam revealed no adenopathy or hepatosplenomegaly.  Hematocrit was 43.7, hemoglobin 14.5, platelets 152,000, WBC 95,500 (ANC 3,700; ALC 87,900). Creatinine was 0.94 (CrCl 58 ml/min). LDH was 163. Uric acid was 5.2.  Low dose chest CT on 10/11/2019 revealed Lung-RADS 0S, incomplete. Study was limited by complete atelectasis in the left lower lobe.  There was no pathologically enlarged mediastinal or hilar lymph nodes.  There was no axillary adenopathy.  Referral to pulmonology for further evaluation was recommended. Once left lower lobe atelectasis has been confirmed by follow-up radiographs, repeat lung cancer screening CT should be performed. The "S" modifier above refers to potentially clinically significant non lung cancer related findings. Specifically, aortic atherosclerosis, in addition to 3 vessel coronary artery disease. There was mild diffuse bronchial wall thickening with mild centrilobular and paraseptal emphysema; imaging findings suggestive of underlying COPD.   Patient was scheduled for follow-up with pulmonary medicine.  She has an appointment with Dr Patsey Berthold on 12/10/2019.  CXR on 11/12/2019 revealed no acute airspace disease. There was emphysema. There was left basilar atelectasis versus scarring.  Labs followed: 10/11/2019: Hematocrit 43.6, hemoglobin 14.5, platelets 171,000, WBC 107,500 (ANC 3,600; ALC 100,200). Creatinine was 0.93 (CrCl 59 ml/min). 11/08/2019: Hematocrit 43.1, hemoglobin 14.5, platelets  130,000, WBC   83,100 (ANC 1,500; ALC   74,100).  During the interim, she has been "normal."  She has been fatigued. She is able to performs chores around the house but needs to sit down and take breaks. She reports shortness of breath on exertion, daily hot flashes, leg swelling, back pain, and allergies. She denies fever, chills, night sweats, lumps or bumps, bruising or bleeding, hematuria, and infections.  She states that when she was on active treatment for CLL, her symptoms did not go away.   Past Medical History:  Diagnosis Date  . Allergy   . Anxiety   . Depression   . GERD (gastroesophageal reflux disease)   . Hemorrhoids   . Hypertension   . Leukemia, lymphoid ([place])    CLL  . Lobar pneumonia ([place])   . Osteopenia   . Personal history of chemotherapy     Past Surgical History:  Procedure Laterality Date  . ABDOMINAL HYSTERECTOMY    . APPENDECTOMY    . BREAST BIOPSY Left    bx x 3-neg  . COLON SURGERY     sigmoid resection  . COLONOSCOPY     2007, 2012  . COLONOSCOPY WITH PROPOFOL N/A 09/17/2015   Procedure: COLONOSCOPY WITH PROPOFOL;  Surgeon: Robert Bellow, MD;  Location: [facility] ENDOSCOPY;  Service: Endoscopy;  Laterality: N/A;  . OOPHORECTOMY    . SPINE SURGERY     L4-5    Family History  Problem Relation Age of Onset  . Osteoporosis Mother   . Hypertension Father   . Heart attack Father   . Stroke Maternal Grandfather   . Breast cancer Sister 72    Social History:  reports that she has been smoking cigarettes. She has a 8.75 pack-year smoking history. She has  never used smokeless tobacco. She reports that she does not drink alcohol and does not use drugs. Currently smokes 5 cigarettes daily, but is working toward quitting. She previously smoked a pack a day for a "longtime".She lives in [place], [place].The patient is alone today.   Allergies:  Allergies  Allergen Reactions  . Augmentin [Amoxicillin-Pot Clavulanate] Swelling    tongue  . Biaxin  [Clarithromycin] Swelling and Rash    tongue    Current Medications: Current Outpatient Medications  Medication Sig Dispense Refill  . albuterol (VENTOLIN HFA) 108 (90 Base) MCG/ACT inhaler TAKE 2 PUFFS BY MOUTH EVERY 6 HOURS AS NEEDED FOR WHEEZE OR SHORTNESS OF BREATH 108 g 9  . atorvastatin (LIPITOR) 10 MG tablet Take 1 tablet (10 mg total) by mouth daily. 90 tablet 1  . benazepril (LOTENSIN) 40 MG tablet Take 1 tablet (40 mg total) by mouth daily. 90 tablet 1  . diphenhydrAMINE (BENADRYL) 25 MG tablet Take 25 mg by mouth at bedtime as needed.    Marland Kitchen LORazepam (ATIVAN) 1 MG tablet Take 0.5 tablets (0.5 mg total) by mouth 2 (two) times daily as needed for anxiety. 30 tablet 5  . Multiple Vitamins-Minerals (MULTIVITAMIN WITH MINERALS) tablet Take 1 tablet by mouth daily.    Marland Kitchen omeprazole (PRILOSEC) 20 MG capsule Take 1 capsule (20 mg total) by mouth daily as needed. 90 capsule 1  . PARoxetine (PAXIL) 30 MG tablet Take 1 tablet (30 mg total) by mouth daily. 90 tablet 1  . doxycycline (VIBRA-TABS) 100 MG tablet Take 1 tablet (100 mg total) by mouth 2 (two) times daily. (Patient not taking: Reported on 12/06/2019) 14 tablet 0  . Pseudoephedrine-DM-GG (ROBITUSSIN CF PO) Take by mouth as needed. (Patient not taking: Reported on 12/06/2019)    . Triamcinolone Acetonide (NASACORT AQ NA) Place 2 Doses into the nose daily as needed.  (Patient not taking: Reported on 12/06/2019)     No current facility-administered medications for this visit.   Review of Systems  Constitutional: Positive for diaphoresis (hot flashes, daily), malaise/fatigue and weight loss (2 lbs). Negative for chills and fever.       Feels "normal."  HENT: Negative for congestion, ear discharge, ear pain, hearing loss, nosebleeds, sinus pain, sore throat and tinnitus.   Eyes: Negative for blurred vision.  Respiratory: Positive for shortness of breath (on exertion). Negative for cough, hemoptysis and sputum production.     Cardiovascular: Positive for leg swelling. Negative for chest pain and palpitations.  Gastrointestinal: Negative for abdominal pain, blood in stool, constipation, diarrhea, heartburn, melena, nausea and vomiting.       Diverticulitis. Sensitive GI tract.  Genitourinary: Negative for dysuria, frequency, hematuria and urgency.  Musculoskeletal: Positive for back pain. Negative for joint pain, myalgias and neck pain.  Skin: Negative for itching and rash.  Neurological: Negative for dizziness, tingling, sensory change, weakness and headaches.  Endo/Heme/Allergies: Positive for environmental allergies. Does not bruise/bleed easily.  Psychiatric/Behavioral: Negative for depression and memory loss. The patient is not nervous/anxious and does not have insomnia.   All other systems reviewed and are negative.  Performance status (ECOG): 1  Vitals Blood pressure 126/74, pulse 76, temperature 97.8 F (36.6 C), temperature source Tympanic, resp. rate 18, height 5' 6"  (1.676 m), weight 153 lb 15.9 oz (69.8 kg), SpO2 96 %.   Physical Exam Vitals and nursing note reviewed.  Constitutional:      General: She is not in acute distress.    Appearance: She is not diaphoretic.  HENT:  Head: Normocephalic and atraumatic.     Comments: Short gray hair.    Mouth/Throat:     Mouth: Mucous membranes are moist.     Pharynx: Oropharynx is clear.  Eyes:     General: No scleral icterus.    Extraocular Movements: Extraocular movements intact.     Conjunctiva/sclera: Conjunctivae normal.     Pupils: Pupils are equal, round, and reactive to light.     Comments: Glasses. Brown eyes.  Cardiovascular:     Rate and Rhythm: Normal rate and regular rhythm.     Heart sounds: Normal heart sounds. No murmur heard.   Pulmonary:     Effort: Pulmonary effort is normal. No respiratory distress.     Breath sounds: Normal breath sounds. No wheezing or rales.     Comments: Lungs open up with deep breaths. Chest:      Chest wall: No tenderness.  Abdominal:     General: Bowel sounds are normal. There is no distension.     Palpations: Abdomen is soft. There is no mass.     Tenderness: There is no abdominal tenderness. There is no guarding or rebound.  Musculoskeletal:        General: No swelling or tenderness. Normal range of motion.     Cervical back: Normal range of motion and neck supple.     Right lower leg: Edema (ankles and feet) present.     Left lower leg: Edema (ankles and feet) present.  Lymphadenopathy:     Head:     Right side of head: No preauricular, posterior auricular or occipital adenopathy.     Left side of head: No preauricular, posterior auricular or occipital adenopathy.     Cervical: No cervical adenopathy.     Upper Body:     Right upper body: No supraclavicular or axillary adenopathy.     Left upper body: No supraclavicular or axillary adenopathy.     Lower Body: No right inguinal adenopathy. No left inguinal adenopathy.  Skin:    General: Skin is warm and dry.     Comments: Hyperpigmented spots on arms. Senile ecchymosis.  Neurological:     Mental Status: She is alert and oriented to person, place, and time.  Psychiatric:        Behavior: Behavior normal.        Thought Content: Thought content normal.        Judgment: Judgment normal.    Appointment on 12/06/2019  Component Date Value Ref Range Status  . Uric Acid, Serum 12/06/2019 4.9  2.5 - 7.1 mg/dL Final   Performed at [facility], [address], [place], [place] [address]  . LDH 12/06/2019 172  98 - 192 U/L Final   Performed at [facility], [address], [place], [place] [address]  . Sodium 12/06/2019 138  135 - 145 mmol/L Final  . Potassium 12/06/2019 4.5  3.5 - 5.1 mmol/L Final  . Chloride 12/06/2019 103  98 - 111 mmol/L Final  . CO2 12/06/2019 26  22 - 32 mmol/L Final  . Glucose, Bld 12/06/2019 106* 70 - 99 mg/dL Final   Glucose reference range applies only to samples taken  after fasting for at least 8 hours.  . BUN 12/06/2019 11  8 - 23 mg/dL Final  . Creatinine, Ser 12/06/2019 0.81  0.44 - 1.00 mg/dL Final  . Calcium 12/06/2019 9.0  8.9 - 10.3 mg/dL Final  . Total Protein 12/06/2019 6.6  6.5 - 8.1 g/dL  Final  . Albumin 12/06/2019 4.4  3.5 - 5.0 g/dL Final  . AST 12/06/2019 20  15 - 41 U/L Final  . ALT 12/06/2019 11  0 - 44 U/L Final  . Alkaline Phosphatase 12/06/2019 97  38 - 126 U/L Final  . Total Bilirubin 12/06/2019 0.6  0.3 - 1.2 mg/dL Final  . GFR, Estimated 12/06/2019 >60  >60 mL/min Final  . Anion gap 12/06/2019 9  5 - 15 Final   Performed at [facility], [address] [place], [place] [address]  . WBC 12/06/2019 104.7* 4.0 - 10.5 K/uL Final   [facility] CRITICAL VALUE PROTOCOL  . RBC 12/06/2019 4.26  3.87 - 5.11 MIL/uL Final  . Hemoglobin 12/06/2019 13.7  12.0 - 15.0 g/dL Final  . HCT 12/06/2019 42.4  36 - 46 % Final  . MCV 12/06/2019 99.5  80.0 - 100.0 fL Final  . MCH 12/06/2019 32.2  26.0 - 34.0 pg Final  . MCHC 12/06/2019 32.3  30.0 - 36.0 g/dL Final  . RDW 12/06/2019 13.5  11.5 - 15.5 % Final  . Platelets 12/06/2019 199  150 - 400 K/uL Final  . nRBC 12/06/2019 0.0  0.0 - 0.2 % Final   Performed at [facility], [address], [place],  [address]  . Neutrophils Relative % 12/06/2019 PENDING  % Incomplete  . Neutro Abs 12/06/2019 PENDING  1.7 - 7.7 K/uL Incomplete  . Band Neutrophils 12/06/2019 PENDING  % Incomplete  . Lymphocytes Relative 12/06/2019 PENDING  % Incomplete  . Lymphs Abs 12/06/2019 PENDING  0.7 - 4.0 K/uL Incomplete  . Monocytes Relative 12/06/2019 PENDING  % Incomplete  . Monocytes Absolute 12/06/2019 PENDING  0.1 - 1.0 K/uL Incomplete  . Eosinophils Relative 12/06/2019 PENDING  % Incomplete  . Eosinophils Absolute 12/06/2019 PENDING  0.0 - 0.5 K/uL Incomplete  . Basophils Relative 12/06/2019 PENDING  % Incomplete  . Basophils Absolute 12/06/2019 PENDING  0.0 - 0.1 K/uL Incomplete  .  WBC Morphology 12/06/2019 PENDING   Incomplete  . RBC Morphology 12/06/2019 PENDING   Incomplete  . Smear Review 12/06/2019 PENDING   Incomplete  . Other 12/06/2019 PENDING  % Incomplete  . nRBC 12/06/2019 PENDING  0 /100 WBC Incomplete  . Metamyelocytes Relative 12/06/2019 PENDING  % Incomplete  . Myelocytes 12/06/2019 PENDING  % Incomplete  . Promyelocytes Relative 12/06/2019 PENDING  % Incomplete  . Blasts 12/06/2019 PENDING  % Incomplete  . Immature Granulocytes 12/06/2019 PENDING  % Incomplete  . Abs Immature Granulocytes 12/06/2019 PENDING  0.00 - 0.07 K/uL Incomplete    Assessment:  Katherine [name] is a 79 y.o. female with chronic lymphocytic leukemia(CLL). WBC has ranged between 22,000 - 101,7000 since 05/2011.  She has received Rituxanx 2 four week cycles (06/27/2015 and 05/27/2017). Hepatitis B surface antigen and hepatitis B surface antibody were negative on 07/04/2015.  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  CCND1, ATM, chromosome 12, and TP53 were normal.   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  There was a CD5 and CD23 positive monoclonal B cell population with lambda light chain restriction, negative for FMC7 and CD38, representing  88% of leukocytes, >5,000/uL. There was no loss of, or aberrant expression of, the pan T cell  antigens to  suggest a neoplastic T cell process. CD4:CD8 ratio 2.0  No circulating blasts were detected. There  was no immunophenotypic evidence of abnormal myeloid maturation.  IGH/BCL2 by FISH revealed 86.5% of nuclei positive for 3 IgH signals.  There were 2BCL2 signals. The IGH/BCL2 fusion signals associated with follicular lymphoma, and to a lesser extent diffuse large cell  lymphoma, were not observed. The presence of 3 IGH  signals suggests an IGH rearrangement with a gene other than BCL2.  She  received ibrutinib from 04/24/2019 - 05/30/2019.  She stopped ibrutinib secondary to hematuria.  WBC has been followed: 107,200 on 04/24/2019, 206,800 on 04/30/2019, 184,700 on 05/07/2019, 209,500 on 05/15/2019 and 237,000 on 05/21/2019.  She developed hematuria. CT hematuria work up on 06/01/2019 was negative.  There was no retroperitoneal or periportal lymphadenopathy. Cystoscopy on 06/13/2019 was normal.  Her last episode of gross hematuria was on 06/02/2019. Urinalysis on 06/07/2019 showed leukocytes 1+ and trace amounts of ketones and RBC. Urine culture revealed mixed urogenital flora.   She has a 35 pack year smoking history. She is in the low dose chest CT program. Low dose chest CTon 02/02/2018 revealed a new endobronchial lesionin the segmental bronchus to the medial segment of the right middle lobe. This may simply reflect an area of retained secretions, however, the possibility of an endobronchial neoplasm should be considered. Lung-RADS 4AS, suspicious.Low dose chest CTon 05/13/2020revealed Lung-RADS 2, benign appearance or behavior. Prior right middle lobe endobronchial lesion/mucous plugging wasno longer visualized. New mucous plugging in the right lower lobe.  Low dose chest CT on 10/11/2019 revealed complete atelectasis in the left lower lobe.  There was no pathologically enlarged mediastinal or hilar lymph nodes.  There was no axillary adenopathy.  There was mild diffuse bronchial wall thickening with mild centrilobular and paraseptal emphysema; imaging findings suggestive of underlying COPD.   Low dose chest CT on 10/11/2019 revealed Lung-RADS 0S, incomplete. Study was limited by complete atelectasis in the left lower lobe.  There was no pathologically enlarged mediastinal or hilar lymph nodes.  There was no axillary adenopathy.  There was aortic atherosclerosis, in addition to 3 vessel coronary artery disease. There was mild diffuse bronchial wall thickening with mild  centrilobular and paraseptal emphysema; imaging findings suggestive of underlying COPD.   She received her first dose of the [employer] COVID-19 vaccine on 05/11/2019.  Symptomatically, she is fatigued.  She denies any B symptoms.  WBC is 104,700  Plan: 1.   Labs today: CBC with diff, CMP, LDH, uric acid. 2. Chronic lymphocytic leukemia (CLL) Clinically, she is fatigued. Exam reveals no adenopathy or hepatosplenomegaly. 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  CCND1, ATM, chromosome 12, and TP53 were normal. She began ibrutinib on 04/24/2019 (discontinued on 05/30/2019).   She developed blood-streaked sputum (slight) then significant hematuria.   She declines further ibrutinib.  WBC appears to be waxing and waning:   Hematocrit 43.7.  Hemoglobin 14.5.  MCV 97.1.  Platelets 152,000.  WBC   95,500 on 09/12/2019.   Hematocrit 43.6.  Hemoglobin 14.5.  MCV 97.8.  Platelets 171,000.  WBC 107,500 on 10/11/2019.   Hematocrit 43.1.  Hemoglobin 14.5.  MCV 97.5.  Platelets 130,000.  WBC   83,100 on 11/08/2019.   Hematocrit 42.4.  Hemoglobin 13.7.  MCV 99.5.  Platelets 199,000.  WBC 104,700 on 12/06/2019.  Discuss patient's thoughts about treatment.  She agrees to ongoing surveillance.  Discuss consideration of treatment (acalabrutinib +/- obinutuzumab or venetoclax + Rituxan).   She may have similar issues with bleeding with  acalabrutinib (as she did with ibrutinib).   She tolerated Rituxan well in the past, discuss Rituxan based regimens.  Continue to monitor closely. 3.   Health maintenance Patientisinthelung cancer screening program. Low-dose chest CT on05/13/2020 was benign.  Low dose chest CT on 10/11/2019 was limited by complete atelectasis in the left lower lobe.      There was no  pathologically enlarged mediastinal or hilar lymph nodes or axillary adenopathy.      Patient to be seen by Dr Patsey Berthold on 12/10/2019.   Encourage smoking cessation. Bilateral mammogram on 11/23/2020revealedno evidence of malignancy.                         Anticipate yearly mammogram in 12/2019. 4.   RTC monthly x 2 for labs (CBC with diff). 5.   RTC in 3 months for MD assessment, labs (CBC with diff, CMP, LDH, uric acid).  I discussed the assessment and treatment plan with the patient.  The patient was provided an opportunity to ask questions and all were answered.  The patient agreed with the plan and demonstrated an understanding of the instructions.  The patient was advised to call back if the symptoms worsen or if the condition fails to improve as anticipated.  I provided 18 minutes of face-to-face time during this this encounter and > 50% was spent counseling as documented under my assessment and plan.  An additional 5-6 minutes were spent reviewing her chart (Epic and Care Everywhere) including notes, labs, and imaging studies.    Lequita Asal, MD, PhD    12/06/2019, 10:52 AM  I, Mirian Mo Tufford, am acting as a Education administrator for [employer]. Mike Gip, MD.   I, Margee Trentham C. Mike Gip, MD, have reviewed the above documentation for accuracy and completeness, and I agree with the above.

## 2019-12-06 ENCOUNTER — Inpatient Hospital Stay: Payer: Medicare HMO | Attending: Hematology and Oncology | Admitting: Hematology and Oncology

## 2019-12-06 ENCOUNTER — Encounter: Payer: Self-pay | Admitting: Hematology and Oncology

## 2019-12-06 ENCOUNTER — Other Ambulatory Visit: Payer: Self-pay

## 2019-12-06 ENCOUNTER — Inpatient Hospital Stay: Payer: Medicare HMO

## 2019-12-06 VITALS — BP 126/74 | HR 76 | Temp 97.8°F | Resp 18 | Ht 66.0 in | Wt 154.0 lb

## 2019-12-06 DIAGNOSIS — Z90710 Acquired absence of both cervix and uterus: Secondary | ICD-10-CM | POA: Diagnosis not present

## 2019-12-06 DIAGNOSIS — C9110 Chronic lymphocytic leukemia of B-cell type not having achieved remission: Secondary | ICD-10-CM | POA: Diagnosis not present

## 2019-12-06 DIAGNOSIS — R5383 Other fatigue: Secondary | ICD-10-CM | POA: Insufficient documentation

## 2019-12-06 DIAGNOSIS — F17210 Nicotine dependence, cigarettes, uncomplicated: Secondary | ICD-10-CM | POA: Diagnosis not present

## 2019-12-06 DIAGNOSIS — Z803 Family history of malignant neoplasm of breast: Secondary | ICD-10-CM | POA: Insufficient documentation

## 2019-12-06 DIAGNOSIS — R69 Illness, unspecified: Secondary | ICD-10-CM | POA: Diagnosis not present

## 2019-12-06 LAB — CBC WITH DIFFERENTIAL/PLATELET
Abs Immature Granulocytes: 0.16 10*3/uL — ABNORMAL HIGH (ref 0.00–0.07)
Basophils Absolute: 0.2 10*3/uL — ABNORMAL HIGH (ref 0.0–0.1)
Basophils Relative: 0 %
Eosinophils Absolute: 0.3 10*3/uL (ref 0.0–0.5)
Eosinophils Relative: 0 %
HCT: 42.4 % (ref 36.0–46.0)
Hemoglobin: 13.7 g/dL (ref 12.0–15.0)
Immature Granulocytes: 0 %
Lymphocytes Relative: 91 %
Lymphs Abs: 94.1 10*3/uL — ABNORMAL HIGH (ref 0.7–4.0)
MCH: 32.2 pg (ref 26.0–34.0)
MCHC: 32.3 g/dL (ref 30.0–36.0)
MCV: 99.5 fL (ref 80.0–100.0)
Monocytes Absolute: 6.4 10*3/uL — ABNORMAL HIGH (ref 0.1–1.0)
Monocytes Relative: 6 %
Neutro Abs: 3.6 10*3/uL (ref 1.7–7.7)
Neutrophils Relative %: 3 %
Platelets: 199 10*3/uL (ref 150–400)
RBC: 4.26 MIL/uL (ref 3.87–5.11)
RDW: 13.5 % (ref 11.5–15.5)
WBC: 104.7 10*3/uL (ref 4.0–10.5)
nRBC: 0 % (ref 0.0–0.2)

## 2019-12-06 LAB — COMPREHENSIVE METABOLIC PANEL
ALT: 11 U/L (ref 0–44)
AST: 20 U/L (ref 15–41)
Albumin: 4.4 g/dL (ref 3.5–5.0)
Alkaline Phosphatase: 97 U/L (ref 38–126)
Anion gap: 9 (ref 5–15)
BUN: 11 mg/dL (ref 8–23)
CO2: 26 mmol/L (ref 22–32)
Calcium: 9 mg/dL (ref 8.9–10.3)
Chloride: 103 mmol/L (ref 98–111)
Creatinine, Ser: 0.81 mg/dL (ref 0.44–1.00)
GFR, Estimated: >60 mL/min (ref >60)
Glucose, Bld: 106 mg/dL — ABNORMAL HIGH (ref 70–99)
Potassium: 4.5 mmol/L (ref 3.5–5.1)
Sodium: 138 mmol/L (ref 135–145)
Total Bilirubin: 0.6 mg/dL (ref 0.3–1.2)
Total Protein: 6.6 g/dL (ref 6.5–8.1)

## 2019-12-06 LAB — URIC ACID: Uric Acid, Serum: 4.9 mg/dL (ref 2.5–7.1)

## 2019-12-06 LAB — LACTATE DEHYDROGENASE: LDH: 172 U/L (ref 98–192)

## 2019-12-06 NOTE — Progress Notes (Signed)
Patient c/o soreness noted to her left side when she lay down

## 2019-12-10 ENCOUNTER — Ambulatory Visit: Payer: Medicare HMO | Admitting: Pulmonary Disease

## 2019-12-10 ENCOUNTER — Encounter: Payer: Self-pay | Admitting: Pulmonary Disease

## 2019-12-10 ENCOUNTER — Other Ambulatory Visit: Payer: Self-pay

## 2019-12-10 VITALS — BP 120/68 | HR 71 | Temp 98.9°F | Ht 66.0 in | Wt 154.8 lb

## 2019-12-10 DIAGNOSIS — J449 Chronic obstructive pulmonary disease, unspecified: Secondary | ICD-10-CM | POA: Diagnosis not present

## 2019-12-10 DIAGNOSIS — R0989 Other specified symptoms and signs involving the circulatory and respiratory systems: Secondary | ICD-10-CM | POA: Diagnosis not present

## 2019-12-10 DIAGNOSIS — R69 Illness, unspecified: Secondary | ICD-10-CM | POA: Diagnosis not present

## 2019-12-10 DIAGNOSIS — R059 Cough, unspecified: Secondary | ICD-10-CM | POA: Diagnosis not present

## 2019-12-10 DIAGNOSIS — K219 Gastro-esophageal reflux disease without esophagitis: Secondary | ICD-10-CM | POA: Diagnosis not present

## 2019-12-10 DIAGNOSIS — F17210 Nicotine dependence, cigarettes, uncomplicated: Secondary | ICD-10-CM

## 2019-12-10 MED ORDER — BREO ELLIPTA 100-25 MCG/INH IN AEPB: 1.0000 | INHALATION_SPRAY | Freq: Every day | RESPIRATORY_TRACT | 0 refills | Status: AC

## 2019-12-10 NOTE — Patient Instructions (Addendum)
We are going to get breathing tests.  I will also ask Burgess Estelle, RN to schedule a follow-up CT for you.  We are providing you with samples of Breo Ellipta 1 puff daily make sure you rinse your mouth well after you use it let us know how this does for you.   See you in follow-up in 4 to 6 weeks time call sooner should any new problems arise.

## 2019-12-10 NOTE — Progress Notes (Addendum)
Subjective:    Patient ID: Katherine [name], female    DOB: 12/05/1940, 79 y.o.   MRN: [id number]  HPI Katherine [name] is a 79 year old current smoker (currently 1/4 pack/day) with a history as noted below, presents for evaluation of an abnormal finding on low-dose chest CT for lung cancer screening.  She is kindly referred by Volney American PA.  She has been assigned to Marnee Guarneri, NP for primary care.  Patient notes some cough productive of tenacious sputum that has been ongoing for "a long time" occasionally she has to lay down on one side to be able to bring mucus up.  She has not had any hemoptysis, no fevers, chills or sweats.  Sputum is usually white to grayish.  At times, as noted, very thick and difficult to expectorate.  She has no prior history of pulmonary evaluation or PFTs.  She does report occasional lower extremity edema.  No orthopnea or paroxysmal nocturnal dyspnea.  She has issues with excessive burping and heartburn.  Prilosec does help some of the symptoms.  She has had no weight loss or anorexia.  Review of Systems A 10 point review of systems was performed and it is as noted above otherwise negative.  Past Medical History:  Diagnosis Date  . Allergy   . Anxiety   . Depression   . GERD (gastroesophageal reflux disease)   . Hemorrhoids   . Hypertension   . 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 Lobar pneumonia ([place])   . Osteopenia   . Personal history of chemotherapy    Past Surgical History:  Procedure Laterality Date  . ABDOMINAL HYSTERECTOMY    . APPENDECTOMY    . BREAST BIOPSY Left    bx x 3-neg  . COLON SURGERY     sigmoid resection  . COLONOSCOPY     2007, 2012  . COLONOSCOPY WITH PROPOFOL N/A 09/17/2015   Procedure: COLONOSCOPY WITH PROPOFOL;  Surgeon: Robert Bellow, MD;  Location: [facility] ENDOSCOPY;  Service: Endoscopy;  Laterality: N/A;  . OOPHORECTOMY    . SPINE SURGERY     L4-5   Family History  Problem Relation Age of Onset  .  Osteoporosis Mother   . Hypertension Father   . Heart attack Father   . Stroke Maternal Grandfather   . Breast cancer Sister 31   Social History   Tobacco Use  . Smoking status: Current Every Day Smoker    Packs/day: 1.00    Years: 35.00    Pack years: 35.00    Types: Cigarettes  . Smokeless tobacco: Never Used  . Tobacco comment: 5 cigarettes daily--12/10/2019  Substance Use Topics  . Alcohol use: No    Alcohol/week: 0.0 standard drinks   Allergies  Allergen Reactions  . Augmentin [Amoxicillin-Pot Clavulanate] Swelling    tongue  . Biaxin [Clarithromycin] Swelling and Rash    tongue   Current Meds  Medication Sig  . albuterol (VENTOLIN HFA) 108 (90 Base) MCG/ACT inhaler TAKE 2 PUFFS BY MOUTH EVERY 6 HOURS AS NEEDED FOR WHEEZE OR SHORTNESS OF BREATH  . atorvastatin (LIPITOR) 10 MG tablet Take 1 tablet (10 mg total) by mouth daily.  . benazepril (LOTENSIN) 40 MG tablet Take 1 tablet (40 mg total) by mouth daily.  . diphenhydrAMINE (BENADRYL) 25 MG tablet Take 25 mg by mouth at bedtime as needed.  Marland Kitchen LORazepam (ATIVAN) 1 MG tablet Take 0.5 tablets (0.5 mg total) by mouth 2 (two) times daily as needed for  anxiety.  . Multiple Vitamins-Minerals (MULTIVITAMIN WITH MINERALS) tablet Take 1 tablet by mouth daily.  Marland Kitchen omeprazole (PRILOSEC) 20 MG capsule Take 1 capsule (20 mg total) by mouth daily as needed.  Marland Kitchen PARoxetine (PAXIL) 30 MG tablet Take 1 tablet (30 mg total) by mouth daily.  . Pseudoephedrine-DM-GG (ROBITUSSIN CF PO) Take by mouth as needed.   . Triamcinolone Acetonide (NASACORT AQ NA) Place 2 Doses into the nose daily as needed.   . [DISCONTINUED] doxycycline (VIBRA-TABS) 100 MG tablet Take 1 tablet (100 mg total) by mouth 2 (two) times daily.   Immunization History  Administered Date(s) Administered  . Fluad Quad(high Dose 65+) 11/08/2019  . Influenza, High Dose Seasonal PF 01/04/2017, 11/07/2018  . Influenza,inj,Quad PF,6+ Mos 01/06/2015, 11/21/2015, 10/07/2017  .  PFIZER SARS-COV-2 Vaccination 05/11/2019, 06/01/2019  . Pneumococcal Conjugate-13 03/06/2014  . Pneumococcal-Unspecified 02/22/1993, 02/23/2003  . Td 07/24/2003  . Tdap 11/18/2010  . Zoster 11/03/2005  . Zoster Recombinat (Shingrix) 02/04/2018       Objective:   Physical Exam BP 120/68 (BP Location: Left Arm, Cuff Size: Normal)   Pulse 71   Temp 98.9 F (37.2 C) (Temporal)   Ht 5\' 6"  (1.676 m)   Wt 154 lb 12.8 oz (70.2 kg)   SpO2 96%   BMI 24.99 kg/m  GENERAL: Well-developed well-nourished woman, no acute distress, fully ambulatory. HEAD: Normocephalic, atraumatic.  EYES: Pupils equal, round, reactive to light.  No scleral icterus.  MOUTH: Nose/mouth/throat not examined due to masking requirements for COVID 19. NECK: Supple. No thyromegaly. Trachea midline. No JVD.  No adenopathy. PULMONARY: Good air entry bilaterally.  Scattered rhonchi throughout, no wheezes. CARDIOVASCULAR: S1 and S2. Regular rate and rhythm.  No rubs, murmurs or gallops heard. ABDOMEN: Benign. MUSCULOSKELETAL: No joint deformity, no clubbing, no edema.  NEUROLOGIC: No focal deficit noted, no gait disturbance, speech is fluent. SKIN: Intact,warm,dry. PSYCH: Mood and behavior normal.  Representative slice from low-dose chest CT performed 11 October 2019, independently reviewed, showing atelectasis of the left lower lobe:        Follow-up chest x-ray performed September 20, independently reviewed shows increase expansion of the left lung compared to prior CT:   Assessment & Plan:     ICD-10-CM   1. COPD suggested by initial evaluation ([place])  J44.9 Pulmonary Function Test [facility] Only   Suspect mixture of chronic bronchitis and emphysema Will obtain PFTs to quantitate Poorly compensated currently Trial of Breo Ellipta  2. Abnormal finding of lung  R09.89    Appears to have been atelectasis due to mucous plugging Follow-up low-dose chest CT  3. Cough  R05.9    Recommend using alternative to ACE  inhibitor STOP SMOKING! Manage reflux  4. Gastroesophageal reflux disease, unspecified whether esophagitis present  K21.9    Continue Prilosec Antireflux measures  5. Tobacco dependence due to cigarettes  F17.210    Currently enrolled in lung cancer screening program Counseled with regards to discontinuation of smoking Oral counseling time 3 to 5 minutes   Orders Placed This Encounter  Procedures  . Pulmonary Function Test [facility] Only    Standing Status:   Future    Standing Expiration Date:   12/09/2020    Order Specific Question:   Full PFT: includes the following: basic spirometry, spirometry pre & post bronchodilator, diffusion capacity (DLCO), lung volumes    Answer:   Full PFT   Meds ordered this encounter  Medications  . fluticasone furoate-vilanterol (BREO ELLIPTA) 100-25 MCG/INH AEPB    Sig: Inhale  1 puff into the lungs daily for 1 day.    Dispense:  28 each    Refill:  0    Order Specific Question:   Lot Number?    Answer:   [id number]    Order Specific Question:   Expiration Date?    Answer:   08/22/2020    Order Specific Question:   Manufacturer?    Answer:   [employer] [[id number]]    Order Specific Question:   Quantity    Answer:   2     C. Derrill Kay, MD [facility]   *This note was dictated using voice recognition software/Dragon.  Despite best efforts to proofread, errors can occur which can change the meaning.  Any change was purely unintentional.

## 2019-12-28 ENCOUNTER — Other Ambulatory Visit: Payer: Self-pay | Admitting: *Deleted

## 2019-12-28 DIAGNOSIS — J9811 Atelectasis: Secondary | ICD-10-CM

## 2020-01-07 ENCOUNTER — Telehealth: Payer: Self-pay

## 2020-01-07 NOTE — Telephone Encounter (Signed)
Pt returned missed call. Given covid test and pft appt info. Nothing further needed.

## 2020-01-07 NOTE — Telephone Encounter (Signed)
Lm to relay date/time of covid test prior to PFT. [facility]/09/2020 [facility]

## 2020-01-08 ENCOUNTER — Ambulatory Visit: Payer: Medicare HMO | Admitting: Pulmonary Disease

## 2020-01-09 ENCOUNTER — Encounter: Payer: Self-pay | Admitting: Hematology and Oncology

## 2020-01-09 ENCOUNTER — Other Ambulatory Visit
Admission: RE | Admit: 2020-01-09 | Discharge: 2020-01-09 | Disposition: A | Discharge status: Home / Self Care | Payer: Medicare HMO | Source: Ambulatory Visit | Attending: Pulmonary Disease | Admitting: Pulmonary Disease

## 2020-01-09 ENCOUNTER — Inpatient Hospital Stay: Payer: Medicare HMO | Attending: Hematology and Oncology

## 2020-01-09 ENCOUNTER — Ambulatory Visit
Admission: RE | Admit: 2020-01-09 | Discharge: 2020-01-09 | Disposition: A | Discharge status: Home / Self Care | Payer: Medicare HMO | Source: Ambulatory Visit | Attending: Nurse Practitioner | Admitting: Nurse Practitioner

## 2020-01-09 ENCOUNTER — Other Ambulatory Visit: Payer: Self-pay | Admitting: Hematology and Oncology

## 2020-01-09 ENCOUNTER — Other Ambulatory Visit: Payer: Self-pay

## 2020-01-09 DIAGNOSIS — C9110 Chronic lymphocytic leukemia of B-cell type not having achieved remission: Secondary | ICD-10-CM

## 2020-01-09 DIAGNOSIS — I2510 Atherosclerotic heart disease of native coronary artery without angina pectoris: Secondary | ICD-10-CM | POA: Diagnosis not present

## 2020-01-09 DIAGNOSIS — Z20822 Contact with and (suspected) exposure to covid-19: Secondary | ICD-10-CM | POA: Insufficient documentation

## 2020-01-09 DIAGNOSIS — I700 Atherosclerosis of aorta: Secondary | ICD-10-CM | POA: Diagnosis not present

## 2020-01-09 DIAGNOSIS — Z79899 Other long term (current) drug therapy: Secondary | ICD-10-CM | POA: Diagnosis not present

## 2020-01-09 DIAGNOSIS — J439 Emphysema, unspecified: Secondary | ICD-10-CM | POA: Diagnosis not present

## 2020-01-09 DIAGNOSIS — J9811 Atelectasis: Secondary | ICD-10-CM | POA: Diagnosis not present

## 2020-01-09 DIAGNOSIS — Z01812 Encounter for preprocedural laboratory examination: Secondary | ICD-10-CM | POA: Insufficient documentation

## 2020-01-09 LAB — CBC WITH DIFFERENTIAL/PLATELET
Abs Immature Granulocytes: 0 10*3/uL (ref 0.00–0.07)
Band Neutrophils: 0 %
Basophils Absolute: 0 10*3/uL (ref 0.0–0.1)
Basophils Relative: 0 %
Blasts: 0 %
Eosinophils Absolute: 0 10*3/uL (ref 0.0–0.5)
Eosinophils Relative: 0 %
HCT: 42.4 % (ref 36.0–46.0)
Hemoglobin: 13.9 g/dL (ref 12.0–15.0)
Lymphocytes Relative: 91 %
Lymphs Abs: 99.8 10*3/uL — ABNORMAL HIGH (ref 0.7–4.0)
MCH: 32.4 pg (ref 26.0–34.0)
MCHC: 32.8 g/dL (ref 30.0–36.0)
MCV: 98.8 fL (ref 80.0–100.0)
Metamyelocytes Relative: 0 %
Monocytes Absolute: 1.1 10*3/uL — ABNORMAL HIGH (ref 0.1–1.0)
Monocytes Relative: 1 %
Myelocytes: 0 %
Neutro Abs: 4.4 10*3/uL (ref 1.7–7.7)
Neutrophils Relative %: 4 %
Other: 4 %
Platelets: 143 10*3/uL — ABNORMAL LOW (ref 150–400)
Promyelocytes Relative: 0 %
RBC: 4.29 MIL/uL (ref 3.87–5.11)
RDW: 14 % (ref 11.5–15.5)
WBC: 109.7 10*3/uL (ref 4.0–10.5)
nRBC: 0 % (ref 0.0–0.2)
nRBC: 0 /100 WBC

## 2020-01-09 LAB — COMPREHENSIVE METABOLIC PANEL
ALT: 12 U/L (ref 0–44)
AST: 22 U/L (ref 15–41)
Albumin: 4.6 g/dL (ref 3.5–5.0)
Alkaline Phosphatase: 102 U/L (ref 38–126)
Anion gap: 9 (ref 5–15)
BUN: 7 mg/dL — ABNORMAL LOW (ref 8–23)
CO2: 27 mmol/L (ref 22–32)
Calcium: 9.3 mg/dL (ref 8.9–10.3)
Chloride: 105 mmol/L (ref 98–111)
Creatinine, Ser: 0.73 mg/dL (ref 0.44–1.00)
GFR, Estimated: >60 mL/min (ref >60)
Glucose, Bld: 81 mg/dL (ref 70–99)
Potassium: 4.6 mmol/L (ref 3.5–5.1)
Sodium: 141 mmol/L (ref 135–145)
Total Bilirubin: 0.9 mg/dL (ref 0.3–1.2)
Total Protein: 6.6 g/dL (ref 6.5–8.1)

## 2020-01-09 LAB — PATHOLOGIST SMEAR REVIEW

## 2020-01-09 LAB — SARS CORONAVIRUS 2 (TAT 6-24 HRS): SARS Coronavirus 2: NEGATIVE

## 2020-01-09 LAB — URIC ACID: Uric Acid, Serum: 4.4 mg/dL (ref 2.5–7.1)

## 2020-01-09 LAB — LACTATE DEHYDROGENASE: LDH: 170 U/L (ref 98–192)

## 2020-01-09 NOTE — Progress Notes (Signed)
Patient called for pre assessment. She denies any pain or concerns at this time.  

## 2020-01-09 NOTE — Progress Notes (Signed)
[facility]  [address] [place], [place] [address] Phone: [phone]  Fax: [phone]   Clinic Day:  01/10/2020  Referring physician: Venita Lick, NP  Chief Complaint: Katherine [name] is a 79 y.o. female with chronic lymphocytic leukemia (CLL) who is seen for 1 month assessment.  HPI: The patient was last seen in the medical oncology clinic on 12/06/2019. At that time, she was fatigued.  She denied any B symptoms. Hematocrit was 42.4, hemoglobin 13.7, platelets 199,000, WBC 104,700 (ANC 3,600). CMP was normal. Uric acid was 4.9. LDH was 172.    The patient saw Dr. Patsey Berthold on 12/20/2019. She reported an ongoing productive cough. Chest CT without contrast on 01/09/2020 revealed interval re-expansion of the left lower lobe.  There was a stable 3 mm left lower lobe pulmonary nodule, benign based on long-term stability. If the patient continues to meet screening eligibility criteria, repeat low-dose lung cancer screening CT in 1 year could be performed.  There was splenomegaly (partially visualized).  There was aortic atherosclerosis and emphysema.  During the interim, she has been "pretty good." She still has hot flashes in the afternoons and at night before bed.  She has non-drenching sweats.  She described being fatigued today and yesterday. She has back pain, which is normal for her. She denies fevers, sweats, shortness of breath, occasional early satiety, and lumps or bumps.   Past Medical History:  Diagnosis Date  . Allergy   . Anxiety   . Depression   . GERD (gastroesophageal reflux disease)   . Hemorrhoids   . Hypertension   . Leukemia, lymphoid ([place])    CLL  . Lobar pneumonia ([place])   . Osteopenia   . Personal history of chemotherapy     Past Surgical History:  Procedure Laterality Date  . ABDOMINAL HYSTERECTOMY    . APPENDECTOMY    . BREAST BIOPSY Left    bx x 3-neg  . COLON SURGERY     sigmoid resection  . COLONOSCOPY      2007, 2012  . COLONOSCOPY WITH PROPOFOL N/A 09/17/2015   Procedure: COLONOSCOPY WITH PROPOFOL;  Surgeon: Robert Bellow, MD;  Location: [facility] ENDOSCOPY;  Service: Endoscopy;  Laterality: N/A;  . OOPHORECTOMY    . SPINE SURGERY     L4-5    Family History  Problem Relation Age of Onset  . Osteoporosis Mother   . Hypertension Father   . Heart attack Father   . Stroke Maternal Grandfather   . Breast cancer Sister 33    Social History:  reports that she has been smoking cigarettes. She has a 35.00 pack-year smoking history. She has never used smokeless tobacco. She reports that she does not drink alcohol and does not use drugs. Currently smokes 5 cigarettes daily, but is working toward quitting. She previously smoked a pack a day for a "longtime".She lives in [place], [place].The patient is alone today.   Allergies:  Allergies  Allergen Reactions  . Augmentin [Amoxicillin-Pot Clavulanate] Swelling    tongue  . Biaxin [Clarithromycin] Swelling and Rash    tongue    Current Medications: Current Outpatient Medications  Medication Sig Dispense Refill  . albuterol (VENTOLIN HFA) 108 (90 Base) MCG/ACT inhaler TAKE 2 PUFFS BY MOUTH EVERY 6 HOURS AS NEEDED FOR WHEEZE OR SHORTNESS OF BREATH 108 g 9  . atorvastatin (LIPITOR) 10 MG tablet Take 1 tablet (10 mg total) by mouth daily. 90 tablet 1  . diphenhydrAMINE (BENADRYL) 25 MG tablet  Take 25 mg by mouth at bedtime as needed.    . Multiple Vitamins-Minerals (MULTIVITAMIN WITH MINERALS) tablet Take 1 tablet by mouth daily.    Marland Kitchen omeprazole (PRILOSEC) 20 MG capsule Take 1 capsule (20 mg total) by mouth daily as needed. 90 capsule 1  . Pseudoephedrine-DM-GG (ROBITUSSIN CF PO) Take by mouth as needed.     . Triamcinolone Acetonide (NASACORT AQ NA) Place 2 Doses into the nose daily as needed.     . benazepril (LOTENSIN) 40 MG tablet TAKE 1 TABLET BY MOUTH EVERY DAY 30 tablet 0  . LORazepam (ATIVAN) 1 MG tablet TAKE 0.5 TABLETS (0.5 MG TOTAL) BY  MOUTH 2 (TWO) TIMES DAILY AS NEEDED FOR ANXIETY. 30 tablet 0  . PARoxetine (PAXIL) 30 MG tablet TAKE 1 TABLET BY MOUTH EVERY DAY 30 tablet 0   No current facility-administered medications for this visit.    Review of Systems  Constitutional: Positive for diaphoresis (non-drenching) and malaise/fatigue (x 2 days). Negative for chills, fever and weight loss (up 1 lb).       Feels "pretty good."  HENT: Negative for congestion, ear discharge, ear pain, hearing loss, nosebleeds, sinus pain, sore throat and tinnitus.   Eyes: Negative for blurred vision.  Respiratory: Negative for cough, hemoptysis, sputum production and shortness of breath.   Cardiovascular: Negative for chest pain, palpitations and leg swelling.  Gastrointestinal: Negative for abdominal pain, blood in stool, constipation, diarrhea, heartburn, melena, nausea and vomiting.       Occasional early satiety.  Genitourinary: Negative for dysuria, frequency, hematuria and urgency.  Musculoskeletal: Positive for back pain. Negative for joint pain, myalgias and neck pain.  Skin: Negative for itching and rash.  Neurological: Negative for dizziness, tingling, sensory change, weakness and headaches.  Endo/Heme/Allergies: Negative for environmental allergies. Does not bruise/bleed easily.       Hot flashes  Psychiatric/Behavioral: Negative for depression and memory loss. The patient is not nervous/anxious and does not have insomnia.   All other systems reviewed and are negative.  Performance status (ECOG): 1  Vitals Blood pressure 140/70, pulse 90, temperature (!) 97.2 F (36.2 C), temperature source Tympanic, resp. rate 18, height 5' 6"  (1.676 m), weight 155 lb 6.8 oz (70.5 kg), SpO2 95 %.   Physical Exam Vitals and nursing note reviewed.  Constitutional:      General: She is not in acute distress.    Appearance: She is not diaphoretic.  HENT:     Head: Normocephalic and atraumatic.     Comments: Short gray hair.    Mouth/Throat:      Mouth: Mucous membranes are moist.     Pharynx: Oropharynx is clear.  Eyes:     General: No scleral icterus.    Extraocular Movements: Extraocular movements intact.     Conjunctiva/sclera: Conjunctivae normal.     Pupils: Pupils are equal, round, and reactive to light.     Comments: Glasses. Brown eyes.  Cardiovascular:     Rate and Rhythm: Normal rate and regular rhythm.     Heart sounds: Normal heart sounds. No murmur heard.   Pulmonary:     Effort: Pulmonary effort is normal. No respiratory distress.     Breath sounds: Normal breath sounds. No wheezing or rales.  Chest:     Chest wall: No tenderness.  Abdominal:     General: Bowel sounds are normal. There is no distension.     Palpations: Abdomen is soft. There is splenomegaly (spleen tip barely palpable). There is no mass.  Tenderness: There is no abdominal tenderness. There is no guarding or rebound.  Musculoskeletal:        General: No swelling or tenderness. Normal range of motion.     Cervical back: Normal range of motion and neck supple.     Right lower leg: Edema (ankles and feet) present.     Left lower leg: Edema (ankles and feet) present.  Lymphadenopathy:     Head:     Right side of head: No preauricular, posterior auricular or occipital adenopathy.     Left side of head: No preauricular, posterior auricular or occipital adenopathy.     Cervical: No cervical adenopathy.     Upper Body:     Right upper body: No supraclavicular or axillary adenopathy.     Left upper body: No supraclavicular or axillary adenopathy.     Lower Body: No right inguinal adenopathy. No left inguinal adenopathy.  Skin:    General: Skin is warm and dry.  Neurological:     Mental Status: She is alert and oriented to person, place, and time.  Psychiatric:        Behavior: Behavior normal.        Thought Content: Thought content normal.        Judgment: Judgment normal.    Office Visit on 01/10/2020  Component Date Value Ref  Range Status  . HCV Ab 01/10/2020 NON REACTIVE  NON REACTIVE Final   Comment: (NOTE) Nonreactive HCV antibody screen is consistent with no HCV infections,  unless recent infection is suspected or other evidence exists to indicate HCV infection.  Performed at [facility], [place] [address], [place], [place] [address]   . Hepatitis B Surface Ag 01/10/2020 NON REACTIVE  NON REACTIVE Final   Performed at [facility], [place] [address], [place], [place] [address]  . Hep B Core Total Ab 01/10/2020 NON REACTIVE  NON REACTIVE Final   Performed at [facility], [place] [address] [place], [place] [address]  . PATH INTERP XXX-IMP 01/10/2020 Comment   Final   Chronic lymphocytic leukemia, B cell, CD38 negative.   (See comment.)  . ANNOTATION COMMENT IMP 01/10/2020 Comment   Corrected   Comment: (NOTE) Recommend clinical correlation and follow up as appropriate. Previous immunophenotyping results from 04/09/19 are reviewed.   Marland Kitchen CLINICAL INFO 01/10/2020 Comment   Corrected   Comment: (NOTE) Chronic Lymphocytic Leukemia Accompanying CBC dated 01-09-20 shows: WBC count 109.7, Lym% 91, Lym 99.8.   Marland Kitchen Specimen Type 01/10/2020 Comment   Final   Peripheral blood  . ASSESSMENT OF LEUKOCYTES 01/10/2020 Comment   Final   Comment: (NOTE) A CD5+, CD23+ clonal B cell population is detected with dim lambda light chain restriction, representing >99% of the B cells and 86% of the leukocytes. The phenotype is typical of chronic lymphocytic leukemia (CLL) /small lymphocytic lymphoma (SLL). There is no significant expression of CD38. There is no loss of, or aberrant expression of, the pan T cell antigens to suggest a neoplastic T cell process. CD4:CD8 ratio 2.1 No circulating blasts are detected. There is no immunophenotypic evidence of abnormal myeloid maturation. Analysis of the leukocyte population shows: granulocytes 8%, monocytes 1%, lymphocytes 91%, blasts <0.1%, B cells 86%, T cells  5%, NK cells <1%.   . % Viable Cells 01/10/2020 Comment   Corrected   97%  . Immunophenotypic Profile 01/10/2020 86% of total cells (Phenotype below)   Corrected   Comment: Comment Abnormal cell population: present   .  ANALYSIS AND GATING STRATEGY 01/10/2020 Comment   Final   8 color analysis with CD45/SSC  . IMMUNOPHENOTYPING STUDY 01/10/2020 Comment   Final   Comment: (NOTE) CD2       (-)            CD3       (-) CD4       (-)            CD5       (+) CD7       (-)            CD8       (-) CD10      (-)            CD11b     (-) CD11c     (+)            CD13      (-) CD14      (-)            CD15      (-) CD16      (-)            CD19      (+) CD20      (+) Dim        CD22      (+) Dim CD23      (+) Bright     CD33      (-) CD34      (-)            CD38      (-) CD45      (+)            CD56      (-) CD57      (-)            CD103     (-) CD117     (-)            FMC-7     (-) HLA-DR    (+)            KAPPA     (-) LAMBDA    (+) Dim        CD64      (-)   . PATHOLOGIST NAME 01/10/2020 Comment   Final   Cecilie Kicks, M. D.  . COMMENT: 01/10/2020 Comment   Corrected   Comment: (NOTE) Each antibody in this assay was utilized to assess for potential abnormalities of studied cell populations or to characterize identified abnormalities. This test was developed and its performance characteristics determined by [facility].  It has not been cleared or approved by the U.S. Food and Drug Administration. The FDA has determined that such clearance or approval is not necessary. This test is used for clinical purposes.  It should not be regarded as investigational or for research. Performed At: -[facility] [address] [place], [place] [id number] Katina Degree MDPhD [id number] Performed At: [facility] [address] [place], [place] [id number] Katina Degree MDPhD [id number]   Appointment on 01/09/2020  Component Date Value Ref Range Status  . LDH 01/09/2020 170  98  - 192 U/L Final   Performed at [facility], [address], [place], [place] [address]  . Uric Acid, Serum 01/09/2020 4.4  2.5 - 7.1 mg/dL Final   Performed at [facility], [address] [place], [place] [address]  . Sodium 01/09/2020  141  135 - 145 mmol/L Final  . Potassium 01/09/2020 4.6  3.5 - 5.1 mmol/L Final  . Chloride 01/09/2020 105  98 - 111 mmol/L Final  . CO2 01/09/2020 27  22 - 32 mmol/L Final  . Glucose, Bld 01/09/2020 81  70 - 99 mg/dL Final   Glucose reference range applies only to samples taken after fasting for at least 8 hours.  . BUN 01/09/2020 7* 8 - 23 mg/dL Final  . Creatinine, Ser 01/09/2020 0.73  0.44 - 1.00 mg/dL Final  . Calcium 01/09/2020 9.3  8.9 - 10.3 mg/dL Final  . Total Protein 01/09/2020 6.6  6.5 - 8.1 g/dL Final  . Albumin 01/09/2020 4.6  3.5 - 5.0 g/dL Final  . AST 01/09/2020 22  15 - 41 U/L Final  . ALT 01/09/2020 12  0 - 44 U/L Final  . Alkaline Phosphatase 01/09/2020 102  38 - 126 U/L Final  . Total Bilirubin 01/09/2020 0.9  0.3 - 1.2 mg/dL Final  . GFR, Estimated 01/09/2020 >60  >60 mL/min Final   Comment: (NOTE) Calculated using the CKD-EPI Creatinine Equation (2021)   . Anion gap 01/09/2020 9  5 - 15 Final   Performed at [facility], [address] [place], [place] [address]  . WBC 01/09/2020 109.7* 4.0 - 10.5 K/uL Final   Comment: This critical result has verified and been called to [facility] by Memory Argue on 11 17 2021 at 1126, and has been read back.  This critical result has verified and been called to [facility] by Memory Argue on 11 17 2021 at 1127, and has been read back.    Marland Kitchen RBC 01/09/2020 4.29  3.87 - 5.11 MIL/uL Final  . Hemoglobin 01/09/2020 13.9  12.0 - 15.0 g/dL Final  . HCT 01/09/2020 42.4  36 - 46 % Final  . MCV 01/09/2020 98.8  80.0 - 100.0 fL Final  . MCH 01/09/2020 32.4  26.0 - 34.0 pg Final  . MCHC 01/09/2020 32.8  30.0 - 36.0 g/dL Final  . RDW 01/09/2020 14.0  11.5 -  15.5 % Final  . Platelets 01/09/2020 143* 150 - 400 K/uL Final  . nRBC 01/09/2020 0.0  0.0 - 0.2 % Final  . Neutrophils Relative % 01/09/2020 4  % Final  . Lymphocytes Relative 01/09/2020 91  % Final  . Monocytes Relative 01/09/2020 1  % Final  . Eosinophils Relative 01/09/2020 0  % Final  . Basophils Relative 01/09/2020 0  % Final  . Band Neutrophils 01/09/2020 0  % Final  . Metamyelocytes Relative 01/09/2020 0  % Final  . Myelocytes 01/09/2020 0  % Final  . Promyelocytes Relative 01/09/2020 0  % Final  . Blasts 01/09/2020 0  % Final  . nRBC 01/09/2020 0  0 /100 WBC Final  . Other 01/09/2020 4  % Final  . Neutro Abs 01/09/2020 4.4  1.7 - 7.7 K/uL Final  . Lymphs Abs 01/09/2020 99.8* 0.7 - 4.0 K/uL Final  . Monocytes Absolute 01/09/2020 1.1* 0.1 - 1.0 K/uL Final  . Eosinophils Absolute 01/09/2020 0.0  0.0 - 0.5 K/uL Final  . Basophils Absolute 01/09/2020 0.0  0.0 - 0.1 K/uL Final  . Abs Immature Granulocytes 01/09/2020 0.00  0.00 - 0.07 K/uL Final  . Smudge Cells 01/09/2020 PRESENT   Final   Performed at [facility], [address], [place], [place] [address]  . Path Review 01/09/2020 Blood smear is reviewed.   Final  Comment: Leukocytosis, with absolute lymphocytosis, comprised of mature lymphocytes with clumped chromatin and scant cytoplasm. No significant increase in larger prolymphocytes. Normocytic erythrocytes without significant anisocytosis. Thrombocytopenia, with unremarkable platelet morphology. The findings appear morphologically compatible with the patient's reported history of CLL, and previous flow cytometric studies performed in February 2021. There is no definite increase in prolymphocytes to suggest progression of disease. Clinical correlation is recommended. Reviewed by Kathi Simpers, M.D. Performed at [facility], [address] [place], [place] [address]   Hospital Outpatient Visit on 01/09/2020  Component Date Value Ref Range Status   . SARS Coronavirus 2 01/09/2020 NEGATIVE  NEGATIVE Final   Comment: (NOTE) SARS-CoV-2 target nucleic acids are NOT DETECTED.  The SARS-CoV-2 RNA is generally detectable in upper and lower respiratory specimens during the acute phase of infection. Negative results do not preclude SARS-CoV-2 infection, do not rule out co-infections with other pathogens, and should not be used as the sole basis for treatment or other patient management decisions. Negative results must be combined with clinical observations, patient history, and epidemiological information. The expected result is Negative.  Fact Sheet for Patients: SugarRoll.be  Fact Sheet for Healthcare Providers: https://www.woods-mathews.com/  This test is not yet approved or cleared by the [place] FDA and  has been authorized for detection and/or diagnosis of SARS-CoV-2 by FDA under an Emergency Use Authorization (EUA). This EUA will remain  in effect (meaning this test can be used) for the duration of the COVID-19 declaration under Se                          ction 564(b)(1) of the Act, 21 U.S.C. section 360bbb-3(b)(1), unless the authorization is terminated or revoked sooner.  Performed at [facility], [place] [address], [place], [place] [address]     Assessment:  Katherine [name] is a 79 y.o. female with chronic lymphocytic leukemia(CLL). WBC has ranged between 22,000 - 101,7000 since 05/2011.  She has received Rituxanx 2 four week cycles (06/27/2015 and 05/27/2017). Hepatitis B surface antigen and hepatitis B surface antibody were negative on 07/04/2015.  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  CCND1, ATM, chromosome 12, and TP53 were normal.   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  There was a CD5 and CD23 positive  monoclonal B cell population with lambda light chain restriction, negative for FMC7 and CD38, representing  88% of leukocytes, >5,000/uL. There was no loss of, or aberrant expression of, the pan T cell  antigens to  suggest a neoplastic T cell process. CD4:CD8 ratio 2.0  No circulating blasts were detected. There was no immunophenotypic evidence of abnormal myeloid maturation.  IGH/BCL2 by FISH revealed 86.5% of nuclei positive for 3 IgH signals.  There were 2BCL2 signals. The IGH/BCL2 fusion signals associated with follicular lymphoma, and to a lesser extent diffuse large cell  lymphoma, were not observed. The presence of 3 IGH  signals suggests an IGH rearrangement with a gene other than BCL2.  She received ibrutinib from 04/24/2019 - 05/30/2019.  She stopped ibrutinib secondary to hematuria.  WBC has been followed: 107,200 on 04/24/2019, 206,800 on 04/30/2019, 184,700 on 05/07/2019, 209,500 on 05/15/2019 and 237,000 on 05/21/2019.  She developed hematuria. CT hematuria work up on 06/01/2019 was negative.  There was no retroperitoneal or periportal lymphadenopathy. Cystoscopy on 06/13/2019 was normal.  Her last episode of gross hematuria was on 06/02/2019. Urinalysis on 06/07/2019 showed leukocytes 1+ and trace amounts of ketones and RBC. Urine culture revealed mixed urogenital flora.   She has a 35 pack year smoking history. She is in the low dose chest CT program. Low dose chest CTon 02/02/2018 revealed a new endobronchial lesionin the segmental bronchus to the medial segment of the right middle lobe. This may simply reflect an area of retained secretions, however, the possibility of an endobronchial neoplasm should be considered. Lung-RADS 4AS, suspicious.Low dose chest CTon 05/13/2020revealed Lung-RADS 2, benign appearance or behavior. Prior right middle lobe endobronchial lesion/mucous plugging wasno longer visualized. New mucous plugging in the right lower lobe.  Low dose chest CT on  10/11/2019 revealed complete atelectasis in the left lower lobe.  There was no pathologically enlarged mediastinal or hilar lymph nodes.  There was no axillary adenopathy.  There was mild diffuse bronchial wall thickening with mild centrilobular and paraseptal emphysema; imaging findings suggestive of underlying COPD.   Low dose chest CT on 10/11/2019 revealed Lung-RADS 0S, incomplete. Study was limited by complete atelectasis in the left lower lobe.  There was no pathologically enlarged mediastinal or hilar lymph nodes.  There was no axillary adenopathy.  There was aortic atherosclerosis, in addition to 3 vessel coronary artery disease. There was mild diffuse bronchial wall thickening with mild centrilobular and paraseptal emphysema; imaging findings suggestive of underlying COPD. Chest CT without contrast on 01/09/2020 revealed interval re-expansion of the left lower lobe.  There was a stable 3 mm left lower lobe pulmonary nodule, benign based on long-term stability. If the patient continues to meet screening eligibility criteria, repeat low-dose lung cancer screening CT in 1 year could be performed.  There was splenomegaly (partially visualized).  There was aortic atherosclerosis and emphysema.  She received her first dose of the [place] COVID-19 vaccine on 05/11/2019.  Symptomatically, she has been fatigued for 2 days.  She notes non-drenching sweats.  Spleen tip is palpable.  Plan: 1.   Labs today:  flow cytometry, hepatitis B core antibody total, hepatitis B surface antigen, hepatitis C antibody. 2. Chronic lymphocytic leukemia (CLL) Clinically, she is fatigued. Exam reveals mild splenomegaly.  Rai stage II. 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  CCND1, ATM, chromosome 12, and TP53 were normal. She began ibrutinib on 04/24/2019  (discontinued on 05/30/2019).   She developed blood-streaked sputum (slight) then significant hematuria.   She declined further ibrutinib.  WBC appears to be waxing and waning:   Hematocrit 43.7.  Hemoglobin 14.5.  MCV 97.1.  Platelets 152,000.  WBC   95,500 on 09/12/2019.   Hematocrit 43.6.  Hemoglobin 14.5.  MCV 97.8.  Platelets 171,000.  WBC 107,500 on 10/11/2019.   Hematocrit 43.1.  Hemoglobin 14.5.  MCV 97.5.  Platelets 130,000.  WBC   83,100 on 11/08/2019.   Hematocrit 42.4.  Hemoglobin 13.7.  MCV 99.5.  Platelets 199,000.  WBC 104,700 on 12/06/2019.   Hematocrit 42.4.  Hemoglobin 13.9.  MCV 98.8.  Platelets 143,000.  WBC 109,700 on 01/09/2020.  Discuss patient's thoughts about treatment.     Discuss concern for the development of symptoms (fatigue, sweats) and new mild splenomegaly.  Discuss consideration of treatment (acalabrutinib +/- obinutuzumab or venetoclax + Rituxan).   Review potential side effects associated with acalabrutinib.  Discuss referral to [facility] (Dr Gloriann Loan).   Patient in agreement. 3.   Health maintenance Patientisinthelung cancer  screening program. Low-dose chest CT on05/13/2020 was benign.  Low dose chest CT on 10/11/2019 was limited by complete atelectasis in the left lower lobe.      There was no pathologically enlarged mediastinal or hilar lymph nodes or axillary adenopathy.     Chest CT  without contrast on 01/09/2020 revealed interval re-expansion of the left lower lobe.      There was a stable 3 mm left lower lobe pulmonary nodule.    Review note from Dr Patsey Berthold on 12/20/2019. Bilateral mammogram on 11/23/2020revealedno evidence of malignancy.                         Anticipate yearly mammogram in 12/2019. 4.   Consult [facility] hematology (Dr Gloriann Loan). 5.   RTC in 1 month for MD assessment, labs (CBC with diff, CMP, LDH, uric acid), and review of [facility] consultation.  I discussed  the assessment and treatment plan with the patient.  The patient was provided an opportunity to ask questions and all were answered.  The patient agreed with the plan and demonstrated an understanding of the instructions.  The patient was advised to call back if the symptoms worsen or if the condition fails to improve as anticipated.  I provided 22 minutes of face-to-face time during this this encounter and > 50% was spent counseling as documented under my assessment and plan.  An additional 10 minutes were spent reviewing her chart (Epic and Care Everywhere) including notes, labs, and imaging studies.  I also spoke with Dr Gloriann Loan at [facility].   Lequita Asal, MD, PhD    01/10/2020, 1:48 PM  I, Mirian Mo Tufford, am acting as a Education administrator for [employer] [employer]. Mike Gip, MD.   Gaetana Michaelis C. Mike Gip, MD, have reviewed the above documentation for accuracy and completeness, and I agree with the above.

## 2020-01-10 ENCOUNTER — Inpatient Hospital Stay: Payer: Medicare HMO | Attending: Hematology and Oncology | Admitting: Hematology and Oncology

## 2020-01-10 ENCOUNTER — Other Ambulatory Visit: Payer: Self-pay

## 2020-01-10 ENCOUNTER — Inpatient Hospital Stay: Payer: Medicare HMO | Attending: Hematology and Oncology

## 2020-01-10 ENCOUNTER — Encounter: Payer: Self-pay | Admitting: Hematology and Oncology

## 2020-01-10 ENCOUNTER — Ambulatory Visit: Payer: Medicare HMO | Attending: Pulmonary Disease

## 2020-01-10 ENCOUNTER — Other Ambulatory Visit: Payer: Self-pay | Admitting: Family Medicine

## 2020-01-10 VITALS — BP 140/70 | HR 90 | Temp 97.2°F | Resp 18 | Ht 66.0 in | Wt 155.4 lb

## 2020-01-10 DIAGNOSIS — I10 Essential (primary) hypertension: Secondary | ICD-10-CM | POA: Insufficient documentation

## 2020-01-10 DIAGNOSIS — C9110 Chronic lymphocytic leukemia of B-cell type not having achieved remission: Secondary | ICD-10-CM | POA: Diagnosis not present

## 2020-01-10 DIAGNOSIS — F32A Depression, unspecified: Secondary | ICD-10-CM | POA: Insufficient documentation

## 2020-01-10 DIAGNOSIS — J449 Chronic obstructive pulmonary disease, unspecified: Secondary | ICD-10-CM | POA: Diagnosis not present

## 2020-01-10 DIAGNOSIS — Z79899 Other long term (current) drug therapy: Secondary | ICD-10-CM | POA: Diagnosis not present

## 2020-01-10 DIAGNOSIS — M8580 Other specified disorders of bone density and structure, unspecified site: Secondary | ICD-10-CM | POA: Diagnosis not present

## 2020-01-10 DIAGNOSIS — Z7189 Other specified counseling: Secondary | ICD-10-CM | POA: Diagnosis not present

## 2020-01-10 DIAGNOSIS — F17210 Nicotine dependence, cigarettes, uncomplicated: Secondary | ICD-10-CM | POA: Diagnosis not present

## 2020-01-10 DIAGNOSIS — R69 Illness, unspecified: Secondary | ICD-10-CM | POA: Diagnosis not present

## 2020-01-10 LAB — PULMONARY FUNCTION TEST ARMC ONLY
DL/VA % pred: 54 %
DL/VA: 2.2 ml/min/mmHg/L
DLCO unc % pred: 52 %
DLCO unc: 10.5 ml/min/mmHg
FEF 25-75 Post: 0.93 L/sec
FEF 25-75 Pre: 0.9 L/sec
FEF2575-%Change-Post: 3 %
FEF2575-%Pred-Post: 59 %
FEF2575-%Pred-Pre: 57 %
FEV1-%Change-Post: -1 %
FEV1-%Pred-Post: 76 %
FEV1-%Pred-Pre: 77 %
FEV1-Post: 1.65 L
FEV1-Pre: 1.68 L
FEV1FVC-%Change-Post: 1 %
FEV1FVC-%Pred-Pre: 82 %
FEV6-%Change-Post: -3 %
FEV6-%Pred-Post: 96 %
FEV6-%Pred-Pre: 100 %
FEV6-Post: 2.66 L
FEV6-Pre: 2.76 L
FEV6FVC-%Change-Post: 0 %
FEV6FVC-%Pred-Post: 105 %
FEV6FVC-%Pred-Pre: 105 %
FVC-%Change-Post: -3 %
FVC-%Pred-Post: 91 %
FVC-%Pred-Pre: 95 %
FVC-Post: 2.67 L
Post FEV1/FVC ratio: 62 %
Post FEV6/FVC ratio: 100 %
Pre FEV1/FVC ratio: 61 %
Pre FEV6/FVC Ratio: 100 %
RV % pred: 100 %
RV: 2.49 L
TLC % pred: 101 %
TLC: 5.41 L

## 2020-01-10 LAB — HEPATITIS B CORE ANTIBODY, TOTAL: Hep B Core Total Ab: NONREACTIVE

## 2020-01-10 LAB — HEPATITIS C ANTIBODY: HCV Ab: NONREACTIVE

## 2020-01-10 LAB — HEPATITIS B SURFACE ANTIGEN: Hepatitis B Surface Ag: NONREACTIVE

## 2020-01-10 MED ORDER — ALBUTEROL SULFATE (2.5 MG/3ML) 0.083% IN NEBU
2.5000 mg | INHALATION_SOLUTION | Freq: Once | RESPIRATORY_TRACT | Status: AC
  Administered 2020-01-10: 2.5 mg via RESPIRATORY_TRACT
  Filled 2020-01-10: qty 3

## 2020-01-11 ENCOUNTER — Other Ambulatory Visit: Payer: Self-pay | Admitting: Family Medicine

## 2020-01-11 DIAGNOSIS — F3342 Major depressive disorder, recurrent, in full remission: Secondary | ICD-10-CM

## 2020-01-11 NOTE — Telephone Encounter (Signed)
Requested medication (s) are due for refill today: yes  Requested medication (s) are on the active medication list: yes   Last refill:  12/14/2019  Future visit scheduled: yes  Notes to clinic:  this refill cannot be delegated    Requested Prescriptions  Pending Prescriptions Disp Refills   LORazepam (ATIVAN) 1 MG tablet [Pharmacy Med Name: LORAZEPAM 1 MG TABLET] 30 tablet     Sig: Take 0.5 tablets (0.5 mg total) by mouth 2 (two) times daily as needed for anxiety.      Not Delegated - Psychiatry:  Anxiolytics/Hypnotics Failed - 01/11/2020  7:05 AM      Failed - This refill cannot be delegated      Failed - Urine Drug Screen completed in last 360 days      Passed - Valid encounter within last 6 months    Recent Outpatient Visits           2 months ago Other emphysema [facility])   [facility] Volney American, [place]   6 months ago Essential hypertension   [facility] [name] [place], [place]   9 months ago Gore, [place], DO   1 year ago Essential hypertension   [place], Rachel Elizabeth, [place]   1 year ago CLL (chronic lymphocytic leukemia) [facility])   [place] Venita Lick, NP       Future Appointments             In 1 week Tyler Pita, MD [place]   In 3 weeks Callender, Barbaraann Faster, NP [employer], [place]   In 5 months McGowan, Shannon A, [place]

## 2020-01-14 LAB — COMP PANEL: LEUKEMIA/LYMPHOMA: Immunophenotypic Profile: 86

## 2020-01-22 ENCOUNTER — Other Ambulatory Visit: Payer: Self-pay

## 2020-01-22 ENCOUNTER — Ambulatory Visit: Payer: Medicare HMO | Admitting: Pulmonary Disease

## 2020-01-22 ENCOUNTER — Encounter: Payer: Self-pay | Admitting: Pulmonary Disease

## 2020-01-22 VITALS — BP 138/80 | HR 76 | Temp 97.1°F | Ht 66.0 in | Wt 157.0 lb

## 2020-01-22 DIAGNOSIS — C9110 Chronic lymphocytic leukemia of B-cell type not having achieved remission: Secondary | ICD-10-CM

## 2020-01-22 DIAGNOSIS — J449 Chronic obstructive pulmonary disease, unspecified: Secondary | ICD-10-CM

## 2020-01-22 DIAGNOSIS — J9811 Atelectasis: Secondary | ICD-10-CM

## 2020-01-22 DIAGNOSIS — R69 Illness, unspecified: Secondary | ICD-10-CM | POA: Diagnosis not present

## 2020-01-22 DIAGNOSIS — K219 Gastro-esophageal reflux disease without esophagitis: Secondary | ICD-10-CM | POA: Diagnosis not present

## 2020-01-22 DIAGNOSIS — R059 Cough, unspecified: Secondary | ICD-10-CM | POA: Diagnosis not present

## 2020-01-22 DIAGNOSIS — F17210 Nicotine dependence, cigarettes, uncomplicated: Secondary | ICD-10-CM

## 2020-01-22 NOTE — Patient Instructions (Signed)
Please do give Breo a trial.  This is 1 puff daily.  There is no how you do with it.  Your CT scan looks very good you likely had pneumonia on the left lung.  This is now resolved.   We will see him in follow-up in 6 months time call sooner should any new problems arise.

## 2020-01-22 NOTE — Progress Notes (Signed)
Subjective:    Patient ID: Katherine [name], female    DOB: Mar 18, 1940, 78 y.o.   MRN: [id number]  HPI Is a 79 year old current smoker (one quarter of a pack a day) with a history of CLL and COPD.  Presents from follow-up after her initial visit of 12/10/2019.  At that time she was noted to have left lower lobe collapse noted incidentally during lung cancer screening CT. today she presents in a transport chair due to pain from her arthritis.  She has not had any symptomatology since last evaluated here.  We did give her a trial of Breo Ellipta on her initial visit here however she was reluctant to take it due to the "side effects".  She had PFTs on 18 November that show moderate obstruction with an FEV1 of 1.68 L or 77% predicted and FVC of 2.76 L or 95% predicted.  No bronchodilator response.  FEV1/FVC was 61%.  Lung volumes normal.  Diffusion capacity impairment moderate.  Consistent with COPD with emphysema.  Today she looks somewhat uncomfortable.  She states that she is having difficulties with her arthritis.  She is also having some issues with regards to her CLL which is being followed by Dr. Mike Gip.  Review of Systems A 10 point review of systems was performed and it is as noted above otherwise negative.    Patient Active Problem List   Diagnosis Date Noted  . GERD (gastroesophageal reflux disease)   . Cellulitis of buttock 08/22/2018  . Allergic rhinitis 07/18/2018  . Goals of care, counseling/discussion 07/05/2018  . Nicotine dependence, cigarettes, uncomplicated 91/63/8466  . Emphysema of lung ([place]) 02/05/2018  . Advanced care planning/counseling discussion 01/09/2018  . Hemorrhoids, internal 08/09/2017  . Anxiety 01/04/2017  . Hypercholesterolemia 01/08/2016  . Atherosclerosis of aorta ([place]) 01/07/2016  . History of colonic polyps 08/14/2015  . CLL (chronic lymphocytic leukemia) ([place]) 05/28/2015  . Senile purpura ([place]) 01/06/2015  . Essential hypertension 01/06/2015  .  Depression 01/06/2015   Allergies  Allergen Reactions  . Augmentin [Amoxicillin-Pot Clavulanate] Swelling    tongue  . Biaxin [Clarithromycin] Swelling and Rash    tongue   Current Meds  Medication Sig  . albuterol (VENTOLIN HFA) 108 (90 Base) MCG/ACT inhaler TAKE 2 PUFFS BY MOUTH EVERY 6 HOURS AS NEEDED FOR WHEEZE OR SHORTNESS OF BREATH  . atorvastatin (LIPITOR) 10 MG tablet Take 1 tablet (10 mg total) by mouth daily.  . benazepril (LOTENSIN) 40 MG tablet TAKE 1 TABLET BY MOUTH EVERY DAY  . diphenhydrAMINE (BENADRYL) 25 MG tablet Take 25 mg by mouth at bedtime as needed.  Marland Kitchen LORazepam (ATIVAN) 1 MG tablet TAKE 0.5 TABLETS (0.5 MG TOTAL) BY MOUTH 2 (TWO) TIMES DAILY AS NEEDED FOR ANXIETY.  . Multiple Vitamins-Minerals (MULTIVITAMIN WITH MINERALS) tablet Take 1 tablet by mouth daily.  Marland Kitchen omeprazole (PRILOSEC) 20 MG capsule Take 1 capsule (20 mg total) by mouth daily as needed.  Marland Kitchen PARoxetine (PAXIL) 30 MG tablet TAKE 1 TABLET BY MOUTH EVERY DAY  . Pseudoephedrine-DM-GG (ROBITUSSIN CF PO) Take by mouth as needed.   . Triamcinolone Acetonide (NASACORT AQ NA) Place 2 Doses into the nose daily as needed.    Immunization History  Administered Date(s) Administered  . Fluad Quad(high Dose 65+) 11/08/2019  . Influenza, High Dose Seasonal PF 01/04/2017, 11/07/2018  . Influenza,inj,Quad PF,6+ Mos 01/06/2015, 11/21/2015, 10/07/2017  . PFIZER SARS-COV-2 Vaccination 05/11/2019, 06/01/2019  . Pneumococcal Conjugate-13 03/06/2014  . Pneumococcal-Unspecified 02/22/1993, 02/23/2003  . Td 07/24/2003  . Tdap  11/18/2010  . Zoster 11/03/2005  . Zoster Recombinat (Shingrix) 02/04/2018      Objective:   Physical Exam BP 138/80 (BP Location: Left Arm, Cuff Size: Normal)   Pulse 76   Temp (!) 97.1 F (36.2 C) (Temporal)   Ht 5\' 6"  (1.676 m)   Wt 157 lb (71.2 kg)   SpO2 96%   BMI 25.34 kg/m   GENERAL: Well-developed well-nourished woman, no acute distress, presents today in transport chair due to  pain from arthritis. HEAD: Normocephalic, atraumatic.  EYES: Pupils equal, round, reactive to light.  No scleral icterus.  MOUTH: Nose/mouth/throat not examined due to masking requirements for COVID 19. NECK: Supple. No thyromegaly. Trachea midline. No JVD.  No adenopathy. PULMONARY: Good air entry bilaterally.  No adventitious sounds. CARDIOVASCULAR: S1 and S2. Regular rate and rhythm.  No rubs, murmurs or gallops heard. ABDOMEN: Benign. MUSCULOSKELETAL: No joint deformity, no clubbing, no edema.  NEUROLOGIC: No focal deficit noted, no gait disturbance, speech is fluent. SKIN: Intact,warm,dry. PSYCH: Mood and behavior normal.  Representative slice of chest CT performed 01/09/2020 with complete resolution of previously noted lower lobe collapse, independently reviewed:      Assessment & Plan:     ICD-10-CM   1. Stage 2 moderate COPD by GOLD classification ([place])  J44.9    She would benefit from bronchodilator Previously given samples of Breo did not try Urged to try Breo  2. Atelectasis of left lung  J98.11    Atelectasis of the left lung has resolved Likely due to mucous plugging from pneumonia  3. Cough  R05.9    This has resolved after her resolution of pneumonia  4. CLL (chronic lymphocytic leukemia) ([facility])  C91.10    Issue adds complexity to her management Followed by Dr. Mike Gip  5. Gastroesophageal reflux disease, unspecified whether esophagitis present  K21.9    She is to continue PPI as she is doing  6. Tobacco dependence due to cigarettes  F17.210    Patient counseled regards to discontinuation of smoking Total counseling time 3 to 5 minutes   Discussion:  The patient's prior abnormality noted on the left lung was due to atelectasis of the left lower lobe. This was likely due to mucous plugging from pneumonia.  She has had complete resolution of this issue by CT scan, recommend continuing with yearly low-dose lung cancer screening CT.  He is to give [facility] a try  with regards to her COPD.  She is to let us know if this medication is effective for her.  We will see her in follow-up in 6 months time she is to contact us prior to that time should any new difficulties arise.  Renold Don, MD [place] [facility]   *This note was dictated using voice recognition software/Dragon.  Despite best efforts to proofread, errors can occur which can change the meaning.  Any change was purely unintentional.

## 2020-02-01 ENCOUNTER — Encounter: Payer: Medicare HMO | Admitting: Family Medicine

## 2020-02-01 ENCOUNTER — Other Ambulatory Visit: Payer: Self-pay

## 2020-02-01 ENCOUNTER — Other Ambulatory Visit: Payer: Self-pay | Admitting: Nurse Practitioner

## 2020-02-01 DIAGNOSIS — I10 Essential (primary) hypertension: Secondary | ICD-10-CM

## 2020-02-02 ENCOUNTER — Encounter: Payer: Self-pay | Admitting: Nurse Practitioner

## 2020-02-02 DIAGNOSIS — R161 Splenomegaly, not elsewhere classified: Secondary | ICD-10-CM | POA: Insufficient documentation

## 2020-02-02 DIAGNOSIS — D696 Thrombocytopenia, unspecified: Secondary | ICD-10-CM | POA: Insufficient documentation

## 2020-02-05 ENCOUNTER — Encounter: Payer: Self-pay | Admitting: Nurse Practitioner

## 2020-02-05 ENCOUNTER — Other Ambulatory Visit: Payer: Self-pay

## 2020-02-05 ENCOUNTER — Other Ambulatory Visit: Payer: Self-pay | Admitting: Hematology and Oncology

## 2020-02-05 ENCOUNTER — Ambulatory Visit (INDEPENDENT_AMBULATORY_CARE_PROVIDER_SITE_OTHER): Payer: Medicare HMO | Admitting: Nurse Practitioner

## 2020-02-05 VITALS — BP 124/71 | HR 83 | Temp 98.0°F | Ht 65.71 in | Wt 155.2 lb

## 2020-02-05 DIAGNOSIS — C9110 Chronic lymphocytic leukemia of B-cell type not having achieved remission: Secondary | ICD-10-CM

## 2020-02-05 DIAGNOSIS — E7800 Pure hypercholesterolemia, unspecified: Secondary | ICD-10-CM | POA: Diagnosis not present

## 2020-02-05 DIAGNOSIS — J432 Centrilobular emphysema: Secondary | ICD-10-CM

## 2020-02-05 DIAGNOSIS — F17210 Nicotine dependence, cigarettes, uncomplicated: Secondary | ICD-10-CM

## 2020-02-05 DIAGNOSIS — Z1231 Encounter for screening mammogram for malignant neoplasm of breast: Secondary | ICD-10-CM | POA: Diagnosis not present

## 2020-02-05 DIAGNOSIS — Z0000 Encounter for general adult medical examination without abnormal findings: Secondary | ICD-10-CM | POA: Diagnosis not present

## 2020-02-05 DIAGNOSIS — F339 Major depressive disorder, recurrent, unspecified: Secondary | ICD-10-CM

## 2020-02-05 DIAGNOSIS — D692 Other nonthrombocytopenic purpura: Secondary | ICD-10-CM | POA: Diagnosis not present

## 2020-02-05 DIAGNOSIS — D696 Thrombocytopenia, unspecified: Secondary | ICD-10-CM

## 2020-02-05 DIAGNOSIS — I700 Atherosclerosis of aorta: Secondary | ICD-10-CM

## 2020-02-05 DIAGNOSIS — Z79899 Other long term (current) drug therapy: Secondary | ICD-10-CM | POA: Diagnosis not present

## 2020-02-05 DIAGNOSIS — K219 Gastro-esophageal reflux disease without esophagitis: Secondary | ICD-10-CM

## 2020-02-05 DIAGNOSIS — I10 Essential (primary) hypertension: Secondary | ICD-10-CM

## 2020-02-05 DIAGNOSIS — F3342 Major depressive disorder, recurrent, in full remission: Secondary | ICD-10-CM

## 2020-02-05 DIAGNOSIS — F419 Anxiety disorder, unspecified: Secondary | ICD-10-CM

## 2020-02-05 DIAGNOSIS — R69 Illness, unspecified: Secondary | ICD-10-CM | POA: Diagnosis not present

## 2020-02-05 MED ORDER — BENAZEPRIL HCL 40 MG PO TABS: 40.0000 mg | ORAL_TABLET | Freq: Every day | ORAL | 4 refills | Status: DC

## 2020-02-05 MED ORDER — PAROXETINE HCL 30 MG PO TABS: 30.0000 mg | ORAL_TABLET | Freq: Every day | ORAL | 4 refills | Status: DC

## 2020-02-05 MED ORDER — LORAZEPAM 1 MG PO TABS
0.5000 mg | ORAL_TABLET | Freq: Two times a day (BID) | ORAL | 4 refills | Status: DC | PRN
Start: 2020-02-05 — End: 2020-07-07

## 2020-02-05 MED ORDER — ATORVASTATIN CALCIUM 10 MG PO TABS: 10.0000 mg | ORAL_TABLET | Freq: Every day | ORAL | 4 refills | Status: DC

## 2020-02-05 MED ORDER — OMEPRAZOLE 20 MG PO CPDR: 20.0000 mg | DELAYED_RELEASE_CAPSULE | Freq: Every day | ORAL | 1 refills | Status: DC | PRN

## 2020-02-05 NOTE — Assessment & Plan Note (Signed)
Chronic, under treatment with oncology.  Recent note reviewed.  Continue collaboration with oncology provider.

## 2020-02-05 NOTE — Assessment & Plan Note (Signed)
I have recommended complete cessation of tobacco use. I have discussed various options available for assistance with tobacco cessation including over the counter methods (Nicotine gum, patch and lozenges). We also discussed prescription options (Chantix, Nicotine Inhaler / Nasal Spray). The patient is not interested in pursuing any prescription tobacco cessation options at this time.  

## 2020-02-05 NOTE — Patient Instructions (Signed)
[place] -- [phone]   Healthy Eating Following a healthy eating pattern may help you to achieve and maintain a healthy body weight, reduce the risk of chronic disease, and live a long and productive life. It is important to follow a healthy eating pattern at an appropriate calorie level for your body. Your nutritional needs should be met primarily through food by choosing a variety of nutrient-rich foods. What are tips for following this plan? Reading food labels  Read labels and choose the following: ? Reduced or low sodium. ? Juices with 100% fruit juice. ? Foods with low saturated fats and high polyunsaturated and monounsaturated fats. ? Foods with whole grains, such as whole wheat, cracked wheat, brown rice, and wild rice. ? Whole grains that are fortified with folic acid. This is recommended for women who are pregnant or who want to become pregnant.  Read labels and avoid the following: ? Foods with a lot of added sugars. These include foods that contain brown sugar, corn sweetener, corn syrup, dextrose, fructose, glucose, high-fructose corn syrup, honey, invert sugar, lactose, malt syrup, maltose, molasses, raw sugar, sucrose, trehalose, or turbinado sugar.  Do not eat more than the following amounts of added sugar per day:  6 teaspoons (25 g) for women.  9 teaspoons (38 g) for men. ? Foods that contain processed or refined starches and grains. ? Refined grain products, such as white flour, degermed cornmeal, white bread, and white rice. Shopping  Choose nutrient-rich snacks, such as vegetables, whole fruits, and nuts. Avoid high-calorie and high-sugar snacks, such as potato chips, fruit snacks, and candy.  Use oil-based dressings and spreads on foods instead of solid fats such as butter, stick margarine, or cream cheese.  Limit pre-made sauces, mixes, and "instant" products such as flavored rice, instant noodles, and ready-made pasta.  Try more plant-protein  sources, such as tofu, tempeh, black beans, edamame, lentils, nuts, and seeds.  Explore eating plans such as the Mediterranean diet or vegetarian diet. Cooking  Use oil to saut or stir-fry foods instead of solid fats such as butter, stick margarine, or lard.  Try baking, boiling, grilling, or broiling instead of frying.  Remove the fatty part of meats before cooking.  Steam vegetables in water or broth. Meal planning   At meals, imagine dividing your plate into fourths: ? One-half of your plate is fruits and vegetables. ? One-fourth of your plate is whole grains. ? One-fourth of your plate is protein, especially lean meats, poultry, eggs, tofu, beans, or nuts.  Include low-fat dairy as part of your daily diet. Lifestyle  Choose healthy options in all settings, including home, work, school, restaurants, or stores.  Prepare your food safely: ? Wash your hands after handling raw meats. ? Keep food preparation surfaces clean by regularly washing with hot, soapy water. ? Keep raw meats separate from ready-to-eat foods, such as fruits and vegetables. ? Cook seafood, meat, poultry, and eggs to the recommended internal temperature. ? Store foods at safe temperatures. In general:  Keep cold foods at 16F (4.4C) or below.  Keep hot foods at 116F (60C) or above.  Keep your freezer at [facility] (-17.8C) or below.  Foods are no longer safe to eat when they have been between the temperatures of 40-116F (4.4-60C) for more than 2 hours. What foods should I eat? Fruits Aim to eat 2 cup-equivalents of fresh, canned (in natural juice), or frozen fruits each day. Examples of 1 cup-equivalent of fruit include 1 small apple, 8 large  strawberries, 1 cup canned fruit,  cup dried fruit, or 1 cup 100% juice. Vegetables Aim to eat 2-3 cup-equivalents of fresh and frozen vegetables each day, including different varieties and colors. Examples of 1 cup-equivalent of vegetables include 2 medium  carrots, 2 cups raw, leafy greens, 1 cup chopped vegetable (raw or cooked), or 1 medium baked potato. Grains Aim to eat 6 ounce-equivalents of whole grains each day. Examples of 1 ounce-equivalent of grains include 1 slice of bread, 1 cup ready-to-eat cereal, 3 cups popcorn, or  cup cooked rice, pasta, or cereal. Meats and other proteins Aim to eat 5-6 ounce-equivalents of protein each day. Examples of 1 ounce-equivalent of protein include 1 egg, 1/2 cup nuts or seeds, or 1 tablespoon (16 g) peanut butter. A cut of meat or fish that is the size of a deck of cards is about 3-4 ounce-equivalents.  Of the protein you eat each week, try to have at least 8 ounces come from seafood. This includes salmon, trout, herring, and anchovies. Dairy Aim to eat 3 cup-equivalents of fat-free or low-fat dairy each day. Examples of 1 cup-equivalent of dairy include 1 cup (240 mL) milk, 8 ounces (250 g) yogurt, 1 ounces (44 g) natural cheese, or 1 cup (240 mL) fortified soy milk. Fats and oils  Aim for about 5 teaspoons (21 g) per day. Choose monounsaturated fats, such as canola and olive oils, avocados, peanut butter, and most nuts, or polyunsaturated fats, such as sunflower, corn, and soybean oils, walnuts, pine nuts, sesame seeds, sunflower seeds, and flaxseed. Beverages  Aim for six 8-oz glasses of water per day. Limit coffee to three to five 8-oz cups per day.  Limit caffeinated beverages that have added calories, such as soda and energy drinks.  Limit alcohol intake to no more than 1 drink a day for nonpregnant women and 2 drinks a day for men. One drink equals 12 oz of beer (355 mL), 5 oz of wine (148 mL), or 1 oz of hard liquor (44 mL). Seasoning and other foods  Avoid adding excess amounts of salt to your foods. Try flavoring foods with herbs and spices instead of salt.  Avoid adding sugar to foods.  Try using oil-based dressings, sauces, and spreads instead of solid fats. This information is based  on general [place] nutrition guidelines. For more information, visit [employer].es. Exact amounts may vary based on your nutrition needs. Summary  A healthy eating plan may help you to maintain a healthy weight, reduce the risk of chronic diseases, and stay active throughout your life.  Plan your meals. Make sure you eat the right portions of a variety of nutrient-rich foods.  Try baking, boiling, grilling, or broiling instead of frying.  Choose healthy options in all settings, including home, work, school, restaurants, or stores. This information is not intended to replace advice given to you by your health care provider. Make sure you discuss any questions you have with your health care provider. Document Revised: 05/23/2017 Document Reviewed: 05/23/2017 Elsevier Patient Education  Katherine [name].

## 2020-02-05 NOTE — Assessment & Plan Note (Signed)
Chronic, stable with no SI/HI.  Continue Paxil at this time and consider transition in future to Sertraline due to age >56 and anticholinergic effects with Paxil.  Continue Ativan as needed, patient aware of risks with long term use.  Refills sent in.  UDS today, obtain controlled substance contract next visit.  Return in 6 months.

## 2020-02-05 NOTE — Assessment & Plan Note (Signed)
Long term, patient aware of risks with long term use.  UDS today and controlled substance contract next visit.

## 2020-02-05 NOTE — Assessment & Plan Note (Signed)
Noted on exam, recommend gentle skin care and monitoring for wounds, if wounds present immediately alert provider. 

## 2020-02-05 NOTE — Assessment & Plan Note (Signed)
Chronic, stable.  Minimal use Prilosec.  Continue PRN use and adjust regimen as needed.

## 2020-02-05 NOTE — Assessment & Plan Note (Signed)
Noted on imaging in past, recommend complete cessation of smoking and continue daily statin for prevention.

## 2020-02-05 NOTE — Assessment & Plan Note (Signed)
Chronic, ongoing with BP at goal today.  Continue current medication regimen and adjust as needed.  Recommend she monitor BP at least a few mornings a week at home and document.  DASH diet at home.  BMP up to date with oncology, reviewed.  Return in 6 months.

## 2020-02-05 NOTE — Assessment & Plan Note (Signed)
With underlying CLL, followed by oncology.  Continue this collaboration. 

## 2020-02-05 NOTE — Assessment & Plan Note (Signed)
Chronic, ongoing.  Continue current medication regimen and adjust as needed. Lipid panel today. 

## 2020-02-05 NOTE — Progress Notes (Addendum)
BP 124/71   Pulse 83   Temp 98 F (36.7 C) (Oral)   Ht 5' 5.71" (1.669 m)   Wt 155 lb 3.2 oz (70.4 kg)   SpO2 95%   BMI 25.27 kg/m    Subjective:    Patient ID: Katherine [name], female    DOB: 1941/01/08, 79 y.o.   MRN: [id number]  HPI: Katherine [name] is a 79 y.o. female presenting on 02/05/2020 for comprehensive medical examination. Current medical complaints include:none  She currently lives with: husband Menopausal Symptoms: no   COPD Continues on Albuterol only at this time, but recent pulmonary visit they did add Breo daily -- she reports not using every day as seemed to be harder to cough mucus up, taking about 3 days a week.  Is followed by oncology for CLL, with last visit 01/10/20 -- is about to start new medication for leukemia.  She is also followed by pulmonary and last saw Dr. Patsey Berthold on 01/22/20. Has annual lung screening CT, with recent 01/09/2020 noting aortic atherosclerosis and emphysema.  She is a current smoker, not interested in quitting.  Continues on Prilosec as needed for GERD, with good control reported. COPD status: stable Satisfied with current treatment?: yes Oxygen use: no Dyspnea frequency: none Cough frequency: none Rescue inhaler frequency: 1-2 times a day Limitation of activity: no Productive cough: none Last Spirometry: with pulmonary Pneumovax: Up to Date Influenza: Up to Date  HYPERTENSION / HYPERLIPIDEMIA Continues on Benazepril and Atorvastatin. Satisfied with current treatment? yes Duration of hypertension: chronic BP monitoring frequency: not checking BP range:  BP medication side effects: no Duration of hyperlipidemia: chronic Cholesterol medication side effects: no Cholesterol supplements: none Medication compliance: good compliance Aspirin: no Recent stressors: no Recurrent headaches: no Visual changes: no Palpitations: no Dyspnea: no Chest pain: no Lower extremity edema: no Dizzy/lightheaded:  no  ANXIETY/STRESS Continues on Paxil 30 MG daily + Ativan 0.5 MG BID PRN -- often takes twice a day.  Pt is aware of risks of benzo medication use to include increased sedation, respiratory suppression, falls, extrapyramidal movements,  dependence and cardiovascular events.  Pt would like to continue treatment as benefit determined to outweigh risk.  PDMP review last filled 01/13/20.  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   Duration:controlled Anxious mood: yes, in afternoon on occasion Excessive worrying: yes, occasional Irritability: no  Sweating: no Nausea: no Palpitations:no Hyperventilation: no Panic attacks: no Agoraphobia: no  Obscessions/compulsions: no Depressed mood: no Depression screen [facility] 2/9 02/05/2020 07/11/2019 01/11/2019 07/11/2018 02/16/2018  Decreased Interest 3 - 1 0 0  Down, Depressed, Hopeless 0 - 1 0 1  PHQ - 2 Score 3 - 2 0 1  Altered sleeping 0 0 0 0 -  Tired, decreased energy 3 1 2 1  -  Change in appetite 0 0 0 0 -  Feeling bad or failure about yourself  0 0 0 0 -  Trouble concentrating 0 0 0 1 -  Moving slowly or fidgety/restless 0 0 0 1 -  Suicidal thoughts 0 0 0 0 -  PHQ-9 Score 6 - 4 3 -  Difficult doing work/chores - - Somewhat difficult Not difficult at all -  Some recent data might be hidden   Anhedonia: no Weight changes: no Insomnia: none Hypersomnia: no Fatigue/loss of energy: no Feelings of worthlessness: no Feelings of guilt: no Impaired concentration/indecisiveness: no Suicidal ideations: no  Crying spells: no  Recent Stressors/Life Changes: no   Relationship problems: no   Family stress: no     Financial stress: no    Job stress: no    Recent death/loss: no  The patient does not have a history of falls. I did not complete a risk assessment for falls. A plan of care for falls was not documented.   Past Medical History:  Past Medical History:  Diagnosis Date  .  Allergy   . Anxiety   . Depression   . GERD (gastroesophageal reflux disease)   . Hemorrhoids   . Hypertension   . Leukemia, lymphoid ([place])    CLL  . Lobar pneumonia ([place])   . Osteopenia   . Personal history of chemotherapy     Surgical History:  Past Surgical History:  Procedure Laterality Date  . ABDOMINAL HYSTERECTOMY    . APPENDECTOMY    . BREAST BIOPSY Left    bx x 3-neg  . COLON SURGERY     sigmoid resection  . COLONOSCOPY     2007, 2012  . COLONOSCOPY WITH PROPOFOL N/A 09/17/2015   Procedure: COLONOSCOPY WITH PROPOFOL;  Surgeon: Robert Bellow, MD;  Location: [facility] ENDOSCOPY;  Service: Endoscopy;  Laterality: N/A;  . OOPHORECTOMY    . SPINE SURGERY     L4-5    Medications:  Current Outpatient Medications on File Prior to Visit  Medication Sig  . albuterol (VENTOLIN HFA) 108 (90 Base) MCG/ACT inhaler TAKE 2 PUFFS BY MOUTH EVERY 6 HOURS AS NEEDED FOR WHEEZE OR SHORTNESS OF BREATH  . diphenhydrAMINE (BENADRYL) 25 MG tablet Take 25 mg by mouth at bedtime as needed.  . Multiple Vitamins-Minerals (MULTIVITAMIN WITH MINERALS) tablet Take 1 tablet by mouth daily.  . Pseudoephedrine-DM-GG (ROBITUSSIN CF PO) Take by mouth as needed.   . Triamcinolone Acetonide (NASACORT AQ NA) Place 2 Doses into the nose daily as needed.    No current facility-administered medications on file prior to visit.    Allergies:  Allergies  Allergen Reactions  . Augmentin [Amoxicillin-Pot Clavulanate] Swelling    tongue  . Biaxin [Clarithromycin] Swelling and Rash    tongue    Social History:  Social History   Socioeconomic History  . Marital status: Married    Spouse name: Not on file  . Number of children: Not on file  . Years of education: 9  . Highest education level: 12th grade  Occupational History  . Not on file  Tobacco Use  . Smoking status: Current Every Day Smoker    Packs/day: 1.00    Years: 35.00    Pack years: 35.00    Types: Cigarettes  . Smokeless tobacco:  Never Used  . Tobacco comment: 5 cigarettes daily--01/22/2020  Vaping Use  . Vaping Use: Never used  Substance and Sexual Activity  . Alcohol use: No    Alcohol/week: 0.0 standard drinks  . Drug use: No  . Sexual activity: Not on file  Other Topics Concern  . Not on file  Social History Narrative  . Not on file   Social Determinants of Health   Financial Resource Strain: Not on file  Food Insecurity: Not on file  Transportation Needs: Not on file  Physical Activity: Not on file  Stress: Not on file  Social Connections: Not on file  Intimate Partner Violence: Not on file   Social History   Tobacco Use  Smoking Status Current Every Day Smoker  . Packs/day: 1.00  . Years: 35.00  . Pack years: 35.00  .  Types: Cigarettes  Smokeless Tobacco Never Used  Tobacco Comment   5 cigarettes daily--01/22/2020   Social History   Substance and Sexual Activity  Alcohol Use No  . Alcohol/week: 0.0 standard drinks    Family History:  Family History  Problem Relation Age of Onset  . Osteoporosis Mother   . Hypertension Father   . Heart attack Father   . Stroke Maternal Grandfather   . Breast cancer Sister 50    Past medical history, surgical history, medications, allergies, family history and social history reviewed with patient today and changes made to appropriate areas of the chart.   Review of Systems - negative All other ROS negative except what is listed above and in the HPI.      Objective:    BP 124/71   Pulse 83   Temp 98 F (36.7 C) (Oral)   Ht 5' 5.71" (1.669 m)   Wt 155 lb 3.2 oz (70.4 kg)   SpO2 95%   BMI 25.27 kg/m   Wt Readings from Last 3 Encounters:  02/05/20 155 lb 3.2 oz (70.4 kg)  01/22/20 157 lb (71.2 kg)  01/10/20 155 lb 6.8 oz (70.5 kg)    Physical Exam Constitutional:      General: She is awake. She is not in acute distress.    Appearance: She is well-developed. She is not ill-appearing.  HENT:     Head: Normocephalic and atraumatic.      Right Ear: Hearing, tympanic membrane, ear canal and external ear normal. No drainage.     Left Ear: Hearing, tympanic membrane, ear canal and external ear normal. No drainage.     Nose: Nose normal.     Right Sinus: No maxillary sinus tenderness or frontal sinus tenderness.     Left Sinus: No maxillary sinus tenderness or frontal sinus tenderness.     Mouth/Throat:     Mouth: Mucous membranes are moist.     Pharynx: Oropharynx is clear. Uvula midline. No pharyngeal swelling, oropharyngeal exudate or posterior oropharyngeal erythema.  Eyes:     General: Lids are normal.        Right eye: No discharge.        Left eye: No discharge.     Extraocular Movements: Extraocular movements intact.     Conjunctiva/sclera: Conjunctivae normal.     Pupils: Pupils are equal, round, and reactive to light.     Visual Fields: Right eye visual fields normal and left eye visual fields normal.  Neck:     Thyroid: No thyromegaly.     Vascular: No carotid bruit.     Trachea: Trachea normal.  Cardiovascular:     Rate and Rhythm: Normal rate and regular rhythm.     Heart sounds: Normal heart sounds. No murmur heard. No gallop.   Pulmonary:     Effort: Pulmonary effort is normal. No accessory muscle usage or respiratory distress.     Breath sounds: Normal breath sounds.  Chest:     Comments: Deferred per patient request Abdominal:     General: Bowel sounds are normal.     Palpations: Abdomen is soft. There is no hepatomegaly or splenomegaly.     Tenderness: There is no abdominal tenderness.  Musculoskeletal:        General: Normal range of motion.     Cervical back: Normal range of motion and neck supple.     Right lower leg: 2+ Pitting Edema present.     Left lower leg: 2+ Pitting Edema present.  Lymphadenopathy:     Head:     Right side of head: No submental, submandibular, tonsillar, preauricular or posterior auricular adenopathy.     Left side of head: No submental, submandibular, tonsillar,  preauricular or posterior auricular adenopathy.     Cervical: No cervical adenopathy.  Skin:    General: Skin is warm and dry.     Capillary Refill: Capillary refill takes less than 2 seconds.     Findings: No rash.     Comments: Scattered areas of pale purple bruising to bilateral upper extremity.  Neurological:     Mental Status: She is alert and oriented to person, place, and time.     Cranial Nerves: Cranial nerves are intact.     Gait: Gait is intact.     Deep Tendon Reflexes: Reflexes are normal and symmetric.     Reflex Scores:      Brachioradialis reflexes are 2+ on the right side and 2+ on the left side.      Patellar reflexes are 2+ on the right side and 2+ on the left side. Psychiatric:        Attention and Perception: Attention normal.        Mood and Affect: Mood normal.        Speech: Speech normal.        Behavior: Behavior normal. Behavior is cooperative.        Thought Content: Thought content normal.        Judgment: Judgment normal.     Results for orders placed or performed in visit on 01/10/20  Hepatitis C antibody  Result Value Ref Range   HCV Ab NON REACTIVE NON REACTIVE  Hepatitis B surface antigen  Result Value Ref Range   Hepatitis B Surface Ag NON REACTIVE NON REACTIVE  Hepatitis B core antibody, total  Result Value Ref Range   Hep B Core Total Ab NON REACTIVE NON REACTIVE  Flow cytometry panel-leukemia/lymphoma work-up  Result Value Ref Range   PATH INTERP XXX-IMP Comment    ANNOTATION COMMENT IMP Comment    CLINICAL INFO Comment    Specimen Type Comment    ASSESSMENT OF LEUKOCYTES Comment    % Viable Cells Comment    Immunophenotypic Profile 86% of total cells (Phenotype below)    ANALYSIS AND GATING STRATEGY Comment    IMMUNOPHENOTYPING STUDY Comment    PATHOLOGIST NAME Comment    COMMENT: Comment       Assessment & Plan:   Problem List Items Addressed This Visit      Cardiovascular and Mediastinum   Senile purpura ([place])    Noted  on exam, recommend gentle skin care and monitoring for wounds, if wounds present immediately alert provider.      Relevant Medications   benazepril (LOTENSIN) 40 MG tablet   atorvastatin (LIPITOR) 10 MG tablet   Essential hypertension    Chronic, ongoing with BP at goal today.  Continue current medication regimen and adjust as needed.  Recommend she monitor BP at least a few mornings a week at home and document.  DASH diet at home.  BMP up to date with oncology, reviewed.  Return in 6 months.        Relevant Medications   benazepril (LOTENSIN) 40 MG tablet   atorvastatin (LIPITOR) 10 MG tablet   Other Relevant Orders   TSH   Atherosclerosis of aorta (HCC)    Noted on imaging in past, recommend complete cessation of smoking and continue daily statin for prevention.  Relevant Medications   benazepril (LOTENSIN) 40 MG tablet   atorvastatin (LIPITOR) 10 MG tablet     Respiratory   Centrilobular emphysema (HCC)    Chronic, ongoing.  Continue current medication regimen and collaboration with pulmonary.  Recommend she reach out to them to alert them of issues with Breo use. Recommend she avoid use of Benadryl due to age >70 and BEERS criteria.  Return to office in 6 months.           Digestive   GERD (gastroesophageal reflux disease)    Chronic, stable.  Minimal use Prilosec.  Continue PRN use and adjust regimen as needed.      Relevant Medications   omeprazole (PRILOSEC) 20 MG capsule     Other   Depression, recurrent (HCC)    Chronic, stable with no SI/HI.  Continue Paxil at this time and consider transition in future to Sertraline due to age >7 and anticholinergic effects with Paxil.  Continue Ativan as needed, patient aware of risks with long term use.  Refills sent in.  UDS today, obtain controlled substance contract next visit.  Return in 6 months.      Relevant Medications   PARoxetine (PAXIL) 30 MG tablet   LORazepam (ATIVAN) 1 MG tablet   CLL (chronic  lymphocytic leukemia) (HCC) - Primary    Chronic, under treatment with oncology.  Recent note reviewed.  Continue collaboration with oncology provider.      Relevant Medications   LORazepam (ATIVAN) 1 MG tablet   Hypercholesterolemia    Chronic, ongoing.  Continue current medication regimen and adjust as needed.  Lipid panel today.         Relevant Medications   benazepril (LOTENSIN) 40 MG tablet   atorvastatin (LIPITOR) 10 MG tablet   Other Relevant Orders   Lipid Panel w/o Chol/HDL Ratio   Anxiety    Chronic, stable with no SI/HI.  Continue Paxil at this time and consider transition in future to Sertraline due to age >61 and anticholinergic effects with Paxil.  Continue Ativan as needed, patient aware of risks with long term use.  Refills sent in.  UDS today, obtain controlled substance contract next visit.  Return in 6 months.      Relevant Medications   PARoxetine (PAXIL) 30 MG tablet   LORazepam (ATIVAN) 1 MG tablet   Nicotine dependence, cigarettes, uncomplicated    I have recommended complete cessation of tobacco use. I have discussed various options available for assistance with tobacco cessation including over the counter methods (Nicotine gum, patch and lozenges). We also discussed prescription options (Chantix, Nicotine Inhaler / Nasal Spray). The patient is not interested in pursuing any prescription tobacco cessation options at this time.       Thrombocytopenia ([place])    With underlying CLL, followed by oncology.  Continue this collaboration.      Long-term current use of benzodiazepine    Long term, patient aware of risks with long term use.  UDS today and controlled substance contract next visit.      Relevant Orders   [id number] 11+Oxyco+Alc+Crt-Bund    Other Visit Diagnoses    Encounter for annual physical exam       Labs up to date with exception of Lipid panel and TSH, ordered today.   Encounter for screening mammogram for malignant neoplasm of breast        Mammogram ordered today.   Relevant Orders   MM 3D SCREEN BREAST BILATERAL   Recurrent major depressive disorder, in  full remission (HCC)       Relevant Medications   PARoxetine (PAXIL) 30 MG tablet   LORazepam (ATIVAN) 1 MG tablet       Follow up plan: Return in about 6 months (around 08/05/2020) for MOOD for refills.   LABORATORY TESTING:  - Pap smear: not applicable  IMMUNIZATIONS:   - Tdap: Tetanus vaccination status reviewed: last tetanus booster within 10 years. - Influenza: Up to date - Pneumovax: Up to date - Prevnar: Up to date - HPV: Not applicable - Zostavax vaccine: Up to date  SCREENING: -Mammogram: Ordered today  - Colonoscopy: Done elsewhere  - Bone Density: Up to date  -Hearing Test: Not applicable  -Spirometry: Up to date   PATIENT COUNSELING:   Advised to take 1 mg of folate supplement per day if capable of pregnancy.   Sexuality: Discussed sexually transmitted diseases, partner selection, use of condoms, avoidance of unintended pregnancy  and contraceptive alternatives.   Advised to avoid cigarette smoking.  I discussed with the patient that most people either abstain from alcohol or drink within safe limits (<=14/week and <=4 drinks/occasion for males, <=7/weeks and <= 3 drinks/occasion for females) and that the risk for alcohol disorders and other health effects rises proportionally with the number of drinks per week and how often a drinker exceeds daily limits.  Discussed cessation/primary prevention of drug use and availability of treatment for abuse.   Diet: Encouraged to adjust caloric intake to maintain  or achieve ideal body weight, to reduce intake of dietary saturated fat and total fat, to limit sodium intake by avoiding high sodium foods and not adding table salt, and to maintain adequate dietary potassium and calcium preferably from fresh fruits, vegetables, and low-fat dairy products.    stressed the importance of regular exercise  Injury  prevention: Discussed safety belts, safety helmets, smoke detector, smoking near bedding or upholstery.   Dental health: Discussed importance of regular tooth brushing, flossing, and dental visits.    NEXT PREVENTATIVE PHYSICAL DUE IN 1 YEAR. Return in about 6 months (around 08/05/2020) for MOOD for refills.

## 2020-02-05 NOTE — Progress Notes (Signed)
[facility]  [address], [address] [place], [place] [address] Phone: [phone]  Fax: [phone]   Clinic Day: 02/06/20  Referring physician: Marjie Skiff, NP  Chief Complaint: Katherine [name] is a 79 y.o. female with chronic lymphocytic leukemia (CLL) who is seen for 1 month assessment.  HPI: The patient was last seen in the medical oncology clinic on 01/10/2020. At that time, she had been fatigued for 2 days.  She noted non-drenching sweats.  Spleen tip was palpable. Hematocrit was 42.4, hemoglobin 13.9, platelets 143,000, WBC 109,700 (ALC 99,800).  Creatinine was 0.73. LDH was 170. Uric acid was 4.4. Hepatitis B core antibody, hepatitis B surface antibody, and hepatitis C antibody were non reactive. We discussed second opinion with Dr. Malen Gauze at [facility].   Peripheral smear revealed leukocytosis, with absolute lymphocytosis, comprised of mature lymphocytes with clumped chromatin and scant cytoplasm. There was no significant increase in larger prolymphocytes. There were normocytic erythrocytes without significant anisocytosis. There was thrombocytopenia, with unremarkable platelet morphology. The findings appeared morphologically compatible with the patient's reported history of CLL, and previous flow cytometric studies performed in February 2021. There was no definite increase in prolymphocytes to suggest progression of disease.  Flow cytometry showed chronic lymphocytic leukemia, B cell, CD38 negative.  The patient was called on 01/21/2020 to schedule an appointment with Dr. Malen Gauze. However, she did not want to drive to [facility].  During the interim, she has been "fine." Her energy level is stable; she has to stop and rest when doing things. She gets sweats 2-3 times per week and they do not last long. They are not drenching sweats. She denies fevers, weight loss, adenopathy, or early satiety.   Past Medical History:  Diagnosis Date  . Allergy   .  Anxiety   . Depression   . GERD (gastroesophageal reflux disease)   . Hemorrhoids   . Hypertension   . Leukemia, lymphoid (HCC)    CLL  . Lobar pneumonia (HCC)   . Osteopenia   . Personal history of chemotherapy     Past Surgical History:  Procedure Laterality Date  . ABDOMINAL HYSTERECTOMY    . APPENDECTOMY    . BREAST BIOPSY Left    bx x 3-neg  . COLON SURGERY     sigmoid resection  . COLONOSCOPY     2007, 2012  . COLONOSCOPY WITH PROPOFOL N/A 09/17/2015   Procedure: COLONOSCOPY WITH PROPOFOL;  Surgeon: Earline Mayotte, MD;  Location: [facility] ENDOSCOPY;  Service: Endoscopy;  Laterality: N/A;  . OOPHORECTOMY    . SPINE SURGERY     L4-5    Family History  Problem Relation Age of Onset  . Osteoporosis Mother   . Hypertension Father   . Heart attack Father   . Stroke Maternal Grandfather   . Breast cancer Sister 45    Social History:  reports that she has been smoking cigarettes. She has a 35.00 pack-year smoking history. She has never used smokeless tobacco. She reports that she does not drink alcohol and does not use drugs. Currently smokes 5 cigarettes daily, but is working toward quitting. She previously smoked a pack a day for a "longtime".She lives in [place], [place].The patient is accompanied by her daughter Tyler Pita in person and daughter Laveda Abbe by FaceTime today.   Allergies:  Allergies  Allergen Reactions  . Augmentin [Amoxicillin-Pot Clavulanate] Swelling    tongue  . Biaxin [Clarithromycin] Swelling and Rash    tongue    Current Medications: Current  Outpatient Medications  Medication Sig Dispense Refill  . albuterol (VENTOLIN HFA) 108 (90 Base) MCG/ACT inhaler TAKE 2 PUFFS BY MOUTH EVERY 6 HOURS AS NEEDED FOR WHEEZE OR SHORTNESS OF BREATH 108 g 9  . atorvastatin (LIPITOR) 10 MG tablet Take 1 tablet (10 mg total) by mouth daily. 90 tablet 4  . benazepril (LOTENSIN) 40 MG tablet Take 1 tablet (40 mg total) by mouth daily. 90 tablet 4  . calcium-vitamin D  (OSCAL WITH D) 500-200 MG-UNIT tablet Take 1 tablet by mouth.    . diphenhydrAMINE (BENADRYL) 25 MG tablet Take 25 mg by mouth at bedtime as needed.    Marland Kitchen LORazepam (ATIVAN) 1 MG tablet Take 0.5 tablets (0.5 mg total) by mouth 2 (two) times daily as needed for anxiety. 30 tablet 4  . Multiple Vitamins-Minerals (MULTIVITAMIN WITH MINERALS) tablet Take 1 tablet by mouth daily.    Marland Kitchen omeprazole (PRILOSEC) 20 MG capsule Take 1 capsule (20 mg total) by mouth daily as needed. 90 capsule 1  . PARoxetine (PAXIL) 30 MG tablet Take 1 tablet (30 mg total) by mouth daily. 90 tablet 4  . Pseudoephedrine-DM-GG (ROBITUSSIN CF PO) Take by mouth as needed.     . Triamcinolone Acetonide (NASACORT AQ NA) Place 2 Doses into the nose daily as needed.      No current facility-administered medications for this visit.    Review of Systems  Constitutional: Positive for diaphoresis (non-drenching, 2-3x per week). Negative for chills, fever, malaise/fatigue and weight loss (stable).       Feels "fine." Energy level is "ok".  HENT: Negative for congestion, ear discharge, ear pain, hearing loss, nosebleeds, sinus pain, sore throat and tinnitus.   Eyes: Negative for blurred vision.  Respiratory: Negative for cough, hemoptysis, sputum production and shortness of breath.   Cardiovascular: Negative for chest pain, palpitations and leg swelling.  Gastrointestinal: Negative for abdominal pain, blood in stool, constipation, diarrhea, heartburn, melena, nausea and vomiting.       No early satiety.  Genitourinary: Negative for dysuria, frequency, hematuria and urgency.  Musculoskeletal: Negative for back pain, joint pain, myalgias and neck pain.  Skin: Negative for itching and rash.  Neurological: Negative for dizziness, tingling, sensory change, weakness and headaches.  Endo/Heme/Allergies: Negative for environmental allergies. Does not bruise/bleed easily.  Psychiatric/Behavioral: Negative for depression and memory loss. The  patient is not nervous/anxious and does not have insomnia.   All other systems reviewed and are negative.  Performance status (ECOG): 1  Vitals Blood pressure 137/81, pulse 83, temperature (!) 96.5 F (35.8 C), temperature source Tympanic, resp. rate 16, weight 155 lb 12.1 oz (70.7 kg), SpO2 97 %.   Physical Exam Vitals and nursing note reviewed.  Constitutional:      General: She is not in acute distress.    Appearance: She is not diaphoretic.  HENT:     Head: Normocephalic and atraumatic.     Comments: Short gray hair.    Mouth/Throat:     Mouth: Mucous membranes are moist.     Pharynx: Oropharynx is clear.  Eyes:     General: No scleral icterus.    Extraocular Movements: Extraocular movements intact.     Conjunctiva/sclera: Conjunctivae normal.     Pupils: Pupils are equal, round, and reactive to light.     Comments: Glasses. Brown eyes.  Cardiovascular:     Rate and Rhythm: Normal rate and regular rhythm.     Heart sounds: Normal heart sounds. No murmur heard.   Pulmonary:  Effort: Pulmonary effort is normal. No respiratory distress.     Breath sounds: Normal breath sounds. No wheezing or rales.  Chest:     Chest wall: No tenderness.  Breasts:     Right: No axillary adenopathy or supraclavicular adenopathy.     Left: No axillary adenopathy or supraclavicular adenopathy.    Abdominal:     General: Bowel sounds are normal. There is no distension.     Palpations: Abdomen is soft. There is splenomegaly (spleen tip barely palpable). There is no mass.     Tenderness: There is no abdominal tenderness. There is no guarding or rebound.  Musculoskeletal:        General: No swelling or tenderness. Normal range of motion.     Cervical back: Normal range of motion and neck supple.  Lymphadenopathy:     Head:     Right side of head: No preauricular, posterior auricular or occipital adenopathy.     Left side of head: No preauricular, posterior auricular or occipital  adenopathy.     Cervical: No cervical adenopathy.     Upper Body:     Right upper body: No supraclavicular or axillary adenopathy.     Left upper body: No supraclavicular or axillary adenopathy.     Lower Body: No right inguinal adenopathy. No left inguinal adenopathy.  Skin:    General: Skin is warm and dry.  Neurological:     Mental Status: She is alert and oriented to person, place, and time.  Psychiatric:        Behavior: Behavior normal.        Thought Content: Thought content normal.        Judgment: Judgment normal.     Appointment on 02/06/2020  Component Date Value Ref Range Status  . Uric Acid, Serum 02/06/2020 4.5  2.5 - 7.1 mg/dL Final   Performed at [facility], [address] [place], [place] [address]  . Sodium 02/06/2020 140  135 - 145 mmol/L Final  . Potassium 02/06/2020 5.1  3.5 - 5.1 mmol/L Final  . Chloride 02/06/2020 103  98 - 111 mmol/L Final  . CO2 02/06/2020 28  22 - 32 mmol/L Final  . Glucose, Bld 02/06/2020 86  70 - 99 mg/dL Final   Glucose reference range applies only to samples taken after fasting for at least 8 hours.  . BUN 02/06/2020 9  8 - 23 mg/dL Final  . Creatinine, Ser 02/06/2020 0.87  0.44 - 1.00 mg/dL Final  . Calcium 02/06/2020 9.3  8.9 - 10.3 mg/dL Final  . GFR, Estimated 02/06/2020 >60  >60 mL/min Final   Comment: (NOTE) Calculated using the CKD-EPI Creatinine Equation (2021)   . Anion gap 02/06/2020 9  5 - 15 Final   Performed at [facility], [address], [place], [place] [address]  . WBC 02/06/2020 116.2* 4.0 - 10.5 K/uL Final  . RBC 02/06/2020 4.28  3.87 - 5.11 MIL/uL Final  . Hemoglobin 02/06/2020 13.7  12.0 - 15.0 g/dL Final  . HCT 02/06/2020 42.6  36.0 - 46.0 % Final  . MCV 02/06/2020 99.5  80.0 - 100.0 fL Final  . MCH 02/06/2020 32.0  26.0 - 34.0 pg Final  . MCHC 02/06/2020 32.2  30.0 - 36.0 g/dL Final  . RDW 02/06/2020 14.4  11.5 - 15.5 % Final  . Platelets 02/06/2020 129* 150 - 400 K/uL Final   . nRBC 02/06/2020 0.0  0.0 - 0.2 % Final  . Neutrophils  Relative % 02/06/2020 3  % Final  . Neutro Abs 02/06/2020 3.5  1.7 - 7.7 K/uL Final  . Lymphocytes Relative 02/06/2020 94  % Final  . Lymphs Abs 02/06/2020 109.2* 0.7 - 4.0 K/uL Final  . Monocytes Relative 02/06/2020 3  % Final  . Monocytes Absolute 02/06/2020 3.5* 0.1 - 1.0 K/uL Final  . Eosinophils Relative 02/06/2020 0  % Final  . Eosinophils Absolute 02/06/2020 0.0  0.0 - 0.5 K/uL Final  . Basophils Relative 02/06/2020 0  % Final  . Basophils Absolute 02/06/2020 0.0  0.0 - 0.1 K/uL Final  . WBC Morphology 02/06/2020 Abnormal lymphocytes present   Final  . RBC Morphology 02/06/2020 NO SCHISTOCYTES SEEN   Final  . Smear Review 02/06/2020 PLATELET COUNT CONFIRMED BY SMEAR   Final  . Abs Immature Granulocytes 02/06/2020 0.00  0.00 - 0.07 K/uL Final   Performed at [facility], [address], [place], [place] [address]  Office Visit on 02/05/2020  Component Date Value Ref Range Status  . Cholesterol, Total 02/05/2020 130  100 - 199 mg/dL Final  . Triglycerides 02/05/2020 135  0 - 149 mg/dL Final  . HDL 02/05/2020 36* >39 mg/dL Final  . VLDL Cholesterol Cal 02/05/2020 24  5 - 40 mg/dL Final  . LDL Chol Calc (NIH) 02/05/2020 70  0 - 99 mg/dL Final  . Ethanol 02/05/2020 Negative  Cutoff=0.020 % Final  . Amphetamines, Urine 02/05/2020 Negative  Cutoff=1000 ng/mL Final   Amphetamine test includes Amphetamine and Methamphetamine.  . Barbiturate 02/05/2020 Negative  Cutoff=200 ng/mL Final  . FMBWGYKZL UR QL 02/05/2020 Negative  Cutoff=200 ng/mL Final  . Cannabinoid Quant, Ur 02/05/2020 Negative  Cutoff=50 ng/mL Final  . Cocaine (Metabolite) 02/05/2020 Negative  Cutoff=300 ng/mL Final  . OPIATE SCREEN URINE 02/05/2020 Negative  Cutoff=300 ng/mL Final   Opiate test includes Codeine, Morphine, Hydromorphone, Hydrocodone.  . Oxycodone/Oxymorphone, Urine 02/05/2020 Negative  Cutoff=300 ng/mL Final   Test includes Oxycodone  and Oxymorphone  . Phencyclidine 02/05/2020 Negative  Cutoff=25 ng/mL Final  . Methadone Screen, Urine 02/05/2020 Negative  Cutoff=300 ng/mL Final  . Propoxyphene 02/05/2020 Negative  Cutoff=300 ng/mL Final  . Meperidine 02/05/2020 Negative  Cutoff=200 ng/mL Final   Comment: This test was developed and its performance characteristics determined by [facility]. It has not been cleared or approved by the Food and Drug Administration.   . Tramadol 02/05/2020 Negative  Cutoff=200 ng/mL Final  . Creatinine 02/05/2020 156.8  20.0 - 300.0 mg/dL Final  . pH, Urine 02/05/2020 6.2  4.5 - 8.9 Final  . TSH 02/05/2020 2.550  0.450 - 4.500 uIU/mL Final    Assessment:  Elexis Pollak is a 79 y.o. female with chronic lymphocytic leukemia(CLL). WBC has ranged between 22,000 - 101,7000 since 05/2011.  She has received Rituxanx 2 four week cycles (06/27/2015 and 05/27/2017). Hepatitis B surface antigen and hepatitis B surface antibody were negative on 07/04/2015.  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  CCND1, ATM, chromosome 12, and TP53 were normal.   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  There was a CD5 and CD23 positive monoclonal B cell population with lambda light chain restriction, negative for FMC7 and CD38, representing  88% of leukocytes, >5,000/uL. There was no loss of, or aberrant expression of, the pan T cell  antigens to  suggest a neoplastic T cell process. CD4:CD8 ratio 2.0  No circulating blasts were detected. There was no immunophenotypic evidence of abnormal myeloid maturation.  IGH/BCL2 by FISH revealed 86.5% of nuclei positive for 3 IgH signals.  There were 2BCL2 signals. The IGH/BCL2 fusion signals associated with follicular lymphoma, and to a lesser extent diffuse large cell  lymphoma, were not observed. The presence of 3 IGH  signals  suggests an IGH rearrangement with a gene other than BCL2.  She received ibrutinib from 04/24/2019 - 05/30/2019.  She stopped ibrutinib secondary to hematuria.  WBC has been followed: 107,200 on 04/24/2019, 206,800 on 04/30/2019, 184,700 on 05/07/2019, 209,500 on 05/15/2019 and 237,000 on 05/21/2019.  She developed hematuria. CT hematuria work up on 06/01/2019 was negative.  There was no retroperitoneal or periportal lymphadenopathy. Cystoscopy on 06/13/2019 was normal.  Her last episode of gross hematuria was on 06/02/2019. Urinalysis on 06/07/2019 showed leukocytes 1+ and trace amounts of ketones and RBC. Urine culture revealed mixed urogenital flora.   She has a 35 pack year smoking history. She is in the low dose chest CT program. Low dose chest CTon 02/02/2018 revealed a new endobronchial lesionin the segmental bronchus to the medial segment of the right middle lobe. This may simply reflect an area of retained secretions, however, the possibility of an endobronchial neoplasm should be considered. Lung-RADS 4AS, suspicious.Low dose chest CTon 05/13/2020revealed Lung-RADS 2, benign appearance or behavior. Prior right middle lobe endobronchial lesion/mucous plugging wasno longer visualized. New mucous plugging in the right lower lobe.  Low dose chest CT on 10/11/2019 revealed complete atelectasis in the left lower lobe.  There was no pathologically enlarged mediastinal or hilar lymph nodes.  There was no axillary adenopathy.  There was mild diffuse bronchial wall thickening with mild centrilobular and paraseptal emphysema; imaging findings suggestive of underlying COPD.   Low dose chest CT on 10/11/2019 revealed Lung-RADS 0S, incomplete. Study was limited by complete atelectasis in the left lower lobe.  There was no pathologically enlarged mediastinal or hilar lymph nodes.  There was no axillary adenopathy.  There was aortic atherosclerosis, in addition to 3 vessel coronary artery disease.  There was mild diffuse bronchial wall thickening with mild centrilobular and paraseptal emphysema; imaging findings suggestive of underlying COPD. Chest CT without contrast on 01/09/2020 revealed interval re-expansion of the left lower lobe.  There was a stable 3 mm left lower lobe pulmonary nodule, benign based on long-term stability. If the patient continues to meet screening eligibility criteria, repeat low-dose lung cancer screening CT in 1 year could be performed.  There was splenomegaly (partially visualized).  There was aortic atherosclerosis and emphysema.  She received her first dose of the [place] COVID-19 vaccine on 05/11/2019.  Symptomatically, she is fatigued.  She describes sweats 2-3 times/week.  Exam reveals a stable spleen tip.  WBC is 116,200.  Plan: 1.   Labs today: CBC with diff, BMP, uric acid. 2. Chronic lymphocytic leukemia (CLL) Clinically, she remains fatigued. Exam reveals mild splenomegaly.  Rai stage II. 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  CCND1, ATM, chromosome 12, and TP53 were normal. She began ibrutinib on 04/24/2019 (discontinued on 05/30/2019).   She developed blood-streaked sputum (slight) then significant hematuria.   She declined further ibrutinib.  WBC appears to be increasing:   Hematocrit 43.1.  Hemoglobin 14.5.  MCV 97.5.  Platelets 130,000.  WBC   83,100 on 11/08/2019.   Hematocrit 42.4.  Hemoglobin 13.7.  MCV 99.5.  Platelets 199,000.  WBC 104,700 on 12/06/2019.   Hematocrit 42.4.  Hemoglobin 13.9.  MCV 98.8.  Platelets 143,000.  WBC 109,700 on 01/09/2020.   Hematocrit 42.6.  Hemoglobin 13.7.  MCV 89.5.  Platelets 129,000.  WBC 116,200 on 02/06/2020.  Symptomatically, she has fatigue and sweats which appear to correlate with her disease.  Previously, we discussed treatment options  (acalabrutinib +/- obinutuzumab or venetoclax + Rituxan).   Re-review acalabrutinib recommendation.   Patient concerned about potential bleeding.  Re-address referral to [facility] to see Dr Gloriann Loan.   Patient in agreement 3.   Health maintenance Patientisinthelung cancer screening program.     Chest CT  without contrast on 01/09/2020 revealed interval re-expansion of the left lower lobe.      There was a stable 3 mm left lower lobe pulmonary nodule.  Bilateral mammogram was due on 01/15/2020. 4.   RN:  Please call Dr Luis Abed coordinator at [facility] for a new appointment.  Dr Royce Macadamia is aware.  She has transportation with her daughters.  They prefer a Tuesday or a Thursday.  Mondays are the most difficult day secondary to their schedule. 5.   RTC in 1 month for MD assessment, labs (CBC with diff), and finalization of treatment plan.  I discussed the assessment and treatment plan with the patient.  The patient was provided an opportunity to ask questions and all were answered.  The patient agreed with the plan and demonstrated an understanding of the instructions.  The patient was advised to call back if the symptoms worsen or if the condition fails to improve as anticipated.  I provided 18 minutes of face-to-face time during this this encounter and > 50% was spent counseling as documented under my assessment and plan.  An additional 5 minutes were spent reviewing his chart (Epic and Care Everywhere) including notes, labs, and imaging studies.    Lequita Asal, MD, PhD    02/06/2020, 2:39 PM   I, Mirian Mo Tufford, am acting as a Education administrator for [employer]. Mike Gip, MD.   I, Jalecia Leon C. Mike Gip, MD, have reviewed the above documentation for accuracy and completeness, and I agree with the above.

## 2020-02-05 NOTE — Assessment & Plan Note (Addendum)
Chronic, ongoing.  Continue current medication regimen and collaboration with pulmonary.  Recommend she reach out to them to alert them of issues with Breo use. Recommend she avoid use of Benadryl due to age >36 and BEERS criteria.  Return to office in 6 months.

## 2020-02-06 ENCOUNTER — Inpatient Hospital Stay: Payer: Medicare HMO | Attending: Hematology and Oncology

## 2020-02-06 ENCOUNTER — Inpatient Hospital Stay: Payer: Medicare HMO | Admitting: Hematology and Oncology

## 2020-02-06 ENCOUNTER — Encounter: Payer: Self-pay | Admitting: Hematology and Oncology

## 2020-02-06 ENCOUNTER — Inpatient Hospital Stay: Payer: Medicare HMO

## 2020-02-06 VITALS — BP 137/81 | HR 83 | Temp 96.5°F | Resp 16 | Wt 155.8 lb

## 2020-02-06 DIAGNOSIS — C9110 Chronic lymphocytic leukemia of B-cell type not having achieved remission: Secondary | ICD-10-CM | POA: Insufficient documentation

## 2020-02-06 DIAGNOSIS — R61 Generalized hyperhidrosis: Secondary | ICD-10-CM | POA: Insufficient documentation

## 2020-02-06 DIAGNOSIS — Z90710 Acquired absence of both cervix and uterus: Secondary | ICD-10-CM | POA: Insufficient documentation

## 2020-02-06 DIAGNOSIS — R911 Solitary pulmonary nodule: Secondary | ICD-10-CM | POA: Insufficient documentation

## 2020-02-06 DIAGNOSIS — R5383 Other fatigue: Secondary | ICD-10-CM | POA: Diagnosis not present

## 2020-02-06 DIAGNOSIS — F17210 Nicotine dependence, cigarettes, uncomplicated: Secondary | ICD-10-CM | POA: Insufficient documentation

## 2020-02-06 DIAGNOSIS — Z79899 Other long term (current) drug therapy: Secondary | ICD-10-CM | POA: Insufficient documentation

## 2020-02-06 DIAGNOSIS — R161 Splenomegaly, not elsewhere classified: Secondary | ICD-10-CM | POA: Insufficient documentation

## 2020-02-06 DIAGNOSIS — R69 Illness, unspecified: Secondary | ICD-10-CM | POA: Diagnosis not present

## 2020-02-06 DIAGNOSIS — Z803 Family history of malignant neoplasm of breast: Secondary | ICD-10-CM | POA: Diagnosis not present

## 2020-02-06 LAB — CBC WITH DIFFERENTIAL/PLATELET
Abs Immature Granulocytes: 0 10*3/uL (ref 0.00–0.07)
Basophils Absolute: 0 10*3/uL (ref 0.0–0.1)
Basophils Relative: 0 %
Eosinophils Absolute: 0 10*3/uL (ref 0.0–0.5)
Eosinophils Relative: 0 %
HCT: 42.6 % (ref 36.0–46.0)
Hemoglobin: 13.7 g/dL (ref 12.0–15.0)
Lymphocytes Relative: 94 %
Lymphs Abs: 109.2 10*3/uL — ABNORMAL HIGH (ref 0.7–4.0)
MCH: 32 pg (ref 26.0–34.0)
MCHC: 32.2 g/dL (ref 30.0–36.0)
MCV: 99.5 fL (ref 80.0–100.0)
Monocytes Absolute: 3.5 10*3/uL — ABNORMAL HIGH (ref 0.1–1.0)
Monocytes Relative: 3 %
Neutro Abs: 3.5 10*3/uL (ref 1.7–7.7)
Neutrophils Relative %: 3 %
Platelets: 129 10*3/uL — ABNORMAL LOW (ref 150–400)
RBC Morphology: NONE SEEN
RBC: 4.28 MIL/uL (ref 3.87–5.11)
RDW: 14.4 % (ref 11.5–15.5)
WBC Morphology: ABNORMAL
WBC: 116.2 10*3/uL (ref 4.0–10.5)
nRBC: 0 % (ref 0.0–0.2)

## 2020-02-06 LAB — BASIC METABOLIC PANEL
Anion gap: 9 (ref 5–15)
BUN: 9 mg/dL (ref 8–23)
CO2: 28 mmol/L (ref 22–32)
Calcium: 9.3 mg/dL (ref 8.9–10.3)
Chloride: 103 mmol/L (ref 98–111)
Creatinine, Ser: 0.87 mg/dL (ref 0.44–1.00)
GFR, Estimated: >60 mL/min (ref >60)
Glucose, Bld: 86 mg/dL (ref 70–99)
Potassium: 5.1 mmol/L (ref 3.5–5.1)
Sodium: 140 mmol/L (ref 135–145)

## 2020-02-06 LAB — TSH: TSH: 2.55 u[IU]/mL (ref 0.450–4.500)

## 2020-02-06 LAB — LIPID PANEL W/O CHOL/HDL RATIO
Cholesterol, Total: 130 mg/dL (ref 100–199)
HDL: 36 mg/dL — ABNORMAL LOW (ref >39)
LDL Chol Calc (NIH): 70 mg/dL (ref 0–99)
Triglycerides: 135 mg/dL (ref 0–149)
VLDL Cholesterol Cal: 24 mg/dL (ref 5–40)

## 2020-02-06 LAB — URIC ACID: Uric Acid, Serum: 4.5 mg/dL (ref 2.5–7.1)

## 2020-02-06 NOTE — Progress Notes (Signed)
Patient here for oncology follow-up appointment, expresses concerns of chronic urinary urgency.

## 2020-02-06 NOTE — Progress Notes (Signed)
Contacted via MyChart   Good afternoon Katherine [name] your cholesterol and thyroid labs came back looking fabulous.  I would not change a thing.  Continue all of your current medications.  Have a Merry Christmas!! Keep being awesome!!  Thank you for allowing me to participate in your care. Kindest regards, Averil Digman

## 2020-02-07 LAB — DRUG SCREEN 764883 11+OXYCO+ALC+CRT-BUND
Amphetamines, Urine: NEGATIVE ng/mL
BENZODIAZ UR QL: NEGATIVE ng/mL
Barbiturate: NEGATIVE ng/mL
Cannabinoid Quant, Ur: NEGATIVE ng/mL
Cocaine (Metabolite): NEGATIVE ng/mL
Creatinine: 156.8 mg/dL (ref 20.0–300.0)
Ethanol: NEGATIVE %
Meperidine: NEGATIVE ng/mL
Methadone Screen, Urine: NEGATIVE ng/mL
OPIATE SCREEN URINE: NEGATIVE ng/mL
Oxycodone/Oxymorphone, Urine: NEGATIVE ng/mL
Phencyclidine: NEGATIVE ng/mL
Propoxyphene: NEGATIVE ng/mL
Tramadol: NEGATIVE ng/mL
pH, Urine: 6.2 (ref 4.5–8.9)

## 2020-02-27 DIAGNOSIS — C9110 Chronic lymphocytic leukemia of B-cell type not having achieved remission: Secondary | ICD-10-CM | POA: Diagnosis not present

## 2020-02-27 DIAGNOSIS — D499 Neoplasm of unspecified behavior of unspecified site: Secondary | ICD-10-CM | POA: Diagnosis not present

## 2020-02-28 ENCOUNTER — Telehealth: Payer: Self-pay | Admitting: Hematology and Oncology

## 2020-02-28 ENCOUNTER — Other Ambulatory Visit: Payer: Self-pay | Admitting: Hematology and Oncology

## 2020-02-28 DIAGNOSIS — C9110 Chronic lymphocytic leukemia of B-cell type not having achieved remission: Secondary | ICD-10-CM

## 2020-02-28 MED ORDER — ACALABRUTINIB 100 MG PO CAPS: 100.0000 mg | ORAL_CAPSULE | Freq: Two times a day (BID) | ORAL | 1 refills | Status: DC

## 2020-02-28 NOTE — Progress Notes (Signed)
START ON PATHWAY REGIMEN - Lymphoma and CLL     A cycle is every 28 days:     Acalabrutinib   **Always confirm dose/schedule in your pharmacy ordering system**  Patient Characteristics: Chronic Lymphocytic Leukemia (CLL), Treatment Indicated, Third Line Disease Type: Chronic Lymphocytic Leukemia (CLL) Disease Type: Not Applicable Disease Type: Not Applicable Treatment Indicated<= Treatment Indicated Line of Therapy: Third Line Intent of Therapy: Non-Curative / Palliative Intent, Discussed with Patient

## 2020-02-28 NOTE — Telephone Encounter (Signed)
Re:  [facility] recommendation  Today I spoke with Dr. Benita Gutter at [facility] regarding Ms. Tarr's consultation.    Recommendations were for acalabrutinib.  Patient was in agreement.  An Rx will be written (100 mg po BID).  Outpatient oral chemotherapy pharmacy will be contacted.   Rosey Bath, MD

## 2020-02-29 ENCOUNTER — Telehealth: Payer: Self-pay | Admitting: Pharmacist

## 2020-02-29 ENCOUNTER — Telehealth: Payer: Self-pay | Admitting: Pharmacy Technician

## 2020-02-29 MED FILL — CALQUENCE 100 MG CAPSULE: 100 | 30 days supply | Qty: 60 | Fill #0

## 2020-02-29 NOTE — Telephone Encounter (Signed)
Oral Chemotherapy Pharmacist Encounter  Patient Education I spoke with patient for overview of new oral chemotherapy medication: Calquence (acalabrutinib) for the treatment of CLL, planned duration until disease progression or unacceptable drug toxicity.   Counseled patient on administration, dosing, side effects, monitoring, drug-food interactions, safe handling, storage, and disposal. Patient will take 1 capsule (100 mg total) by mouth 2 (two) times daily.  Side effects include but not limited to: HA, diarrhea, decreased wbc/hgb/plt.    Plan is in agreement to stop her PPI due to the DDI with acalabrutinib. She knows she can use an H2-antagonist or calcium carbonate as needed if separated from the acalabrutinib.  Reviewed with patient importance of keeping a medication schedule and plan for any missed doses.  After discussion with patient no patient barriers to medication adherence identified.   Katherine [name] voiced understanding and appreciation. All questions answered. Medication handout provided.  Provided patient with [facility] phone number. Patient knows to call the office with questions or concerns. Oral Chemotherapy Navigation Clinic will continue to follow.  [name], PharmD, BCPS, BCOP, CPP Hematology/Oncology Clinical Pharmacist Practitioner [facility]/HP/AP [facility] [phone]  02/29/2020 3:24 PM

## 2020-02-29 NOTE — Telephone Encounter (Signed)
Oral Oncology Patient Advocate Encounter  Was successful in securing patient a $8000 grant from [facility] to provide copayment coverage for Calquence.  This will keep the out of pocket expense at $0.     [employer] ID: [id number]  I have spoken with the patient.   The billing information is as follows and has been shared with [place].    RxBin: [id number] PCN: [id number] Member ID: [id number] Group ID: [id number] Dates of Eligibility: 01/30/20 through 01/28/21  Fund:  Chronic Lymphocytic [place] Patient [place] Phone [phone] Fax [phone] 02/29/2020 2:33 PM

## 2020-02-29 NOTE — Telephone Encounter (Signed)
Oral Oncology Pharmacist Encounter   Prior Authorization for Calquence has been approved.     PA# [id number] Effective dates: 02/23/2020 through 21/31/2022   [facility] will continue to follow.   Eddie Candle, PharmD PGY2 Hematology/Oncology Pharmacy Resident Oral Chemotherapy Navigation Clinic 02/29/2020 10:38 AM

## 2020-02-29 NOTE — Telephone Encounter (Signed)
Oral Oncology Pharmacist Encounter   PA submitted on 02/29/2020 Key [id number] Status is pending   Oral Oncology Clinic will continue to follow.   Eddie Candle, PharmD PGY2 Hematology/Oncology Pharmacy Resident Oral Chemotherapy Navigation Clinic 02/29/2020 10:33 AM

## 2020-02-29 NOTE — Telephone Encounter (Signed)
Oral Oncology Pharmacist Encounter  Received new prescription for Calquence (acalabrutinib) for the treatment of CLL, planned duration until disease progression or unacceptable drug toxicity.  Labs from 02/06/2020 assessed, BMP wnl, CBC with elevated WBC and decreased platelets to be expected with her CLL. Prescription dose and frequency assessed and appropriate.   Current medication list in Epic reviewed, DDIs with omeprazole and Calquence identified: recommend for patient to discontinue omeprazole as concomitant therapy can decrease therapeutic levels and efficacy of Calquence.   Prescription has been e-scribed to the [facility] for benefits analysis and approval.  Oral Oncology Clinic will continue to follow for insurance authorization, copayment issues, initial counseling and start date.  Eddie Candle, PharmD, BCPS PGY2 Hematology/Oncology Pharmacy Resident Oral Chemotherapy Navigation Clinic 02/29/2020 8:34 AM

## 2020-03-03 NOTE — Telephone Encounter (Signed)
Oral Chemotherapy Pharmacist Encounter   Messaged Dr. Mike Gip about the start plan for the [place]. She would like to have Ms. Figler hold on getting started until she see her in clinic. Called to let Ms. Castagnola know, she stated her understanding of the plan.  [name], PharmD, BCPS, BCOP, CPP Hematology/Oncology Clinical Pharmacist [facility] [phone]  03/03/2020 10:06 AM

## 2020-03-06 NOTE — Progress Notes (Incomplete)
[facility]  [address], [address] [place], [place] [address] Phone: [phone]  Fax: [phone]   Clinic Day: 03/06/20  Referring physician: Volney [name],*   Chief Complaint: Katherine [name] is a 80 y.o. female with chronic lymphocytic leukemia (CLL) who is seen for 1 month assessment and initiation of acalabrutinib (Calquence).  HPI: The patient was last seen in the medical oncology clinic on 02/06/2020. At that time, she was fatigued.  She described sweats 2-3 times/week.  Exam revealed a stable spleen tip. Hematocrit was 42.6, hemoglobin 13.7, platelets 129,000, WBC 116,200. BMP was normal. Uric acid was 4.5.  We discussed treatment options.  She was referred to [facility] for a second opinion.  The patient saw Dr. Gloriann [name] on 02/27/2020. It was felt that it was important to re-start treatment. Acalabrutinib was recommended indefinitely until it was no longer effective or tolerated. An alternative strategy would be to treat with Venetoclax +/- obinutuzumab. The patient elected to proceed with acalabrutinib.  They discussed that she would have to stop her PPI.  [facility] pathologist smear review on 02/27/2020 revealed absolute lymphocytosis with smudge cells and rare prolymphocytes, compatible with reported history of chronic lymphocytic leukemia.  During the interim, ***   Past Medical History:  Diagnosis Date  . Allergy   . Anxiety   . Depression   . GERD (gastroesophageal reflux disease)   . Hemorrhoids   . Hypertension   . Leukemia, lymphoid ([place])    CLL  . Lobar pneumonia ([place])   . Osteopenia   . Personal history of chemotherapy     Past Surgical History:  Procedure Laterality Date  . ABDOMINAL HYSTERECTOMY    . APPENDECTOMY    . BREAST BIOPSY Left    bx x 3-neg  . COLON SURGERY     sigmoid resection  . COLONOSCOPY     2007, 2012  . COLONOSCOPY WITH PROPOFOL N/A 09/17/2015   Procedure: COLONOSCOPY WITH PROPOFOL;  Surgeon:  Robert Bellow, MD;  Location: [facility] ENDOSCOPY;  Service: Endoscopy;  Laterality: N/A;  . OOPHORECTOMY    . SPINE SURGERY     L4-5    Family History  Problem Relation Age of Onset  . Osteoporosis Mother   . Hypertension Father   . Heart attack Father   . Stroke Maternal Grandfather   . Breast cancer Sister 34    Social History:  reports that she has been smoking cigarettes. She has a 35.00 pack-year smoking history. She has never used smokeless tobacco. She reports that she does not drink alcohol and does not use drugs. Currently smokes 5 cigarettes daily, but is working toward quitting. She previously smoked a pack a day for a "longtime".She lives in [place], [place].The patient is accompanied by her daughter Katherine [name] in person and daughter Katherine [name] by FaceTime*** today.   Allergies:  Allergies  Allergen Reactions  . Augmentin [Amoxicillin-Pot Clavulanate] Swelling    tongue  . Biaxin [Clarithromycin] Swelling and Rash    tongue    Current Medications: Current Outpatient Medications  Medication Sig Dispense Refill  . acalabrutinib (CALQUENCE) 100 MG capsule Take 1 capsule (100 mg total) by mouth 2 (two) times daily. 60 capsule 1  . albuterol (VENTOLIN HFA) 108 (90 Base) MCG/ACT inhaler TAKE 2 PUFFS BY MOUTH EVERY 6 HOURS AS NEEDED FOR WHEEZE OR SHORTNESS OF BREATH 108 g 9  . atorvastatin (LIPITOR) 10 MG tablet Take 1 tablet (10 mg total) by mouth daily. 90 tablet 4  . benazepril (  LOTENSIN) 40 MG tablet Take 1 tablet (40 mg total) by mouth daily. 90 tablet 4  . calcium-vitamin D (OSCAL WITH D) 500-200 MG-UNIT tablet Take 1 tablet by mouth.    . diphenhydrAMINE (BENADRYL) 25 MG tablet Take 25 mg by mouth at bedtime as needed.    Marland Kitchen LORazepam (ATIVAN) 1 MG tablet Take 0.5 tablets (0.5 mg total) by mouth 2 (two) times daily as needed for anxiety. 30 tablet 4  . Multiple Vitamins-Minerals (MULTIVITAMIN WITH MINERALS) tablet Take 1 tablet by mouth daily.    Marland Kitchen PARoxetine (PAXIL) 30 MG tablet  Take 1 tablet (30 mg total) by mouth daily. 90 tablet 4  . Pseudoephedrine-DM-GG (ROBITUSSIN CF PO) Take by mouth as needed.     . Triamcinolone Acetonide (NASACORT AQ NA) Place 2 Doses into the nose daily as needed.      No current facility-administered medications for this visit.    Review of Systems  Constitutional: Positive for diaphoresis (non-drenching, 2-3x per week). Negative for chills, fever, malaise/fatigue and weight loss (stable).       Feels "fine." Energy level is "ok".  HENT: Negative for congestion, ear discharge, ear pain, hearing loss, nosebleeds, sinus pain, sore throat and tinnitus.   Eyes: Negative for blurred vision.  Respiratory: Negative for cough, hemoptysis, sputum production and shortness of breath.   Cardiovascular: Negative for chest pain, palpitations and leg swelling.  Gastrointestinal: Negative for abdominal pain, blood in stool, constipation, diarrhea, heartburn, melena, nausea and vomiting.       No early satiety.  Genitourinary: Negative for dysuria, frequency, hematuria and urgency.  Musculoskeletal: Negative for back pain, joint pain, myalgias and neck pain.  Skin: Negative for itching and rash.  Neurological: Negative for dizziness, tingling, sensory change, weakness and headaches.  Endo/Heme/Allergies: Negative for environmental allergies. Does not bruise/bleed easily.  Psychiatric/Behavioral: Negative for depression and memory loss. The patient is not nervous/anxious and does not have insomnia.   All other systems reviewed and are negative.  Performance status (ECOG): 1***  Vitals There were no vitals taken for this visit.   Physical Exam Vitals and nursing note reviewed.  Constitutional:      General: She is not in acute distress.    Appearance: She is not diaphoretic.  HENT:     Head: Normocephalic and atraumatic.     Comments: Short gray hair.    Mouth/Throat:     Mouth: Mucous membranes are moist.     Pharynx: Oropharynx is clear.   Eyes:     General: No scleral icterus.    Extraocular Movements: Extraocular movements intact.     Conjunctiva/sclera: Conjunctivae normal.     Pupils: Pupils are equal, round, and reactive to light.     Comments: Glasses. Brown eyes.  Cardiovascular:     Rate and Rhythm: Normal rate and regular rhythm.     Heart sounds: Normal heart sounds. No murmur heard.   Pulmonary:     Effort: Pulmonary effort is normal. No respiratory distress.     Breath sounds: Normal breath sounds. No wheezing or rales.  Chest:     Chest wall: No tenderness.  Breasts:     Right: No axillary adenopathy or supraclavicular adenopathy.     Left: No axillary adenopathy or supraclavicular adenopathy.    Abdominal:     General: Bowel sounds are normal. There is no distension.     Palpations: Abdomen is soft. There is splenomegaly (spleen tip barely palpable). There is no mass.  Tenderness: There is no abdominal tenderness. There is no guarding or rebound.  Musculoskeletal:        General: No swelling or tenderness. Normal range of motion.     Cervical back: Normal range of motion and neck supple.  Lymphadenopathy:     Head:     Right side of head: No preauricular, posterior auricular or occipital adenopathy.     Left side of head: No preauricular, posterior auricular or occipital adenopathy.     Cervical: No cervical adenopathy.     Upper Body:     Right upper body: No supraclavicular or axillary adenopathy.     Left upper body: No supraclavicular or axillary adenopathy.     Lower Body: No right inguinal adenopathy. No left inguinal adenopathy.  Skin:    General: Skin is warm and dry.  Neurological:     Mental Status: She is alert and oriented to person, place, and time.  Psychiatric:        Behavior: Behavior normal.        Thought Content: Thought content normal.        Judgment: Judgment normal.     No visits with results within 3 Day(s) from this visit.  Latest known visit with results is:   Appointment on 02/06/2020  Component Date Value Ref Range Status  . Uric Acid, Serum 02/06/2020 4.5  2.5 - 7.1 mg/dL Final   Performed at [facility], [address], [place], [place] [address]  . Sodium 02/06/2020 140  135 - 145 mmol/L Final  . Potassium 02/06/2020 5.1  3.5 - 5.1 mmol/L Final  . Chloride 02/06/2020 103  98 - 111 mmol/L Final  . CO2 02/06/2020 28  22 - 32 mmol/L Final  . Glucose, Bld 02/06/2020 86  70 - 99 mg/dL Final   Glucose reference range applies only to samples taken after fasting for at least 8 hours.  . BUN 02/06/2020 9  8 - 23 mg/dL Final  . Creatinine, Ser 02/06/2020 0.87  0.44 - 1.00 mg/dL Final  . Calcium 02/06/2020 9.3  8.9 - 10.3 mg/dL Final  . GFR, Estimated 02/06/2020 >60  >60 mL/min Final   Comment: (NOTE) Calculated using the CKD-EPI Creatinine Equation (2021)   . Anion gap 02/06/2020 9  5 - 15 Final   Performed at [facility], [address] [place], [place] [address]  . WBC 02/06/2020 116.2* 4.0 - 10.5 K/uL Final  . RBC 02/06/2020 4.28  3.87 - 5.11 MIL/uL Final  . Hemoglobin 02/06/2020 13.7  12.0 - 15.0 g/dL Final  . HCT 02/06/2020 42.6  36.0 - 46.0 % Final  . MCV 02/06/2020 99.5  80.0 - 100.0 fL Final  . MCH 02/06/2020 32.0  26.0 - 34.0 pg Final  . MCHC 02/06/2020 32.2  30.0 - 36.0 g/dL Final  . RDW 02/06/2020 14.4  11.5 - 15.5 % Final  . Platelets 02/06/2020 129* 150 - 400 K/uL Final  . nRBC 02/06/2020 0.0  0.0 - 0.2 % Final  . Neutrophils Relative % 02/06/2020 3  % Final  . Neutro Abs 02/06/2020 3.5  1.7 - 7.7 K/uL Final  . Lymphocytes Relative 02/06/2020 94  % Final  . Lymphs Abs 02/06/2020 109.2* 0.7 - 4.0 K/uL Final  . Monocytes Relative 02/06/2020 3  % Final  . Monocytes Absolute 02/06/2020 3.5* 0.1 - 1.0 K/uL Final  . Eosinophils Relative 02/06/2020 0  % Final  . Eosinophils Absolute 02/06/2020 0.0  0.0 - 0.5  K/uL Final  . Basophils Relative 02/06/2020 0  % Final  . Basophils Absolute 02/06/2020 0.0  0.0  - 0.1 K/uL Final  . WBC Morphology 02/06/2020 Abnormal lymphocytes present   Final  . RBC Morphology 02/06/2020 NO SCHISTOCYTES SEEN   Final  . Smear Review 02/06/2020 PLATELET COUNT CONFIRMED BY SMEAR   Final  . Abs Immature Granulocytes 02/06/2020 0.00  0.00 - 0.07 K/uL Final   Performed at [facility], [address], [place], [place] [address]    Assessment:  Deerica Waszak is a 80 y.o. female with chronic lymphocytic leukemia(CLL). WBC has ranged between 22,000 - 101,7000 since 05/2011.  She has received Rituxanx 2 four week cycles (06/27/2015 and 05/27/2017). Hepatitis B surface antigen and hepatitis B surface antibody were negative on 07/04/2015.  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  CCND1, ATM, chromosome 12, and TP53 were normal.   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  There was a CD5 and CD23 positive monoclonal B cell population with lambda light chain restriction, negative for FMC7 and CD38, representing  88% of leukocytes, >5,000/uL. There was no loss of, or aberrant expression of, the pan T cell  antigens to  suggest a neoplastic T cell process. CD4:CD8 ratio 2.0  No circulating blasts were detected. There was no immunophenotypic evidence of abnormal myeloid maturation.  IGH/BCL2 by FISH revealed 86.5% of nuclei positive for 3 IgH signals.  There were 2BCL2 signals. The IGH/BCL2 fusion signals associated with follicular lymphoma, and to a lesser extent diffuse large cell  lymphoma, were not observed. The presence of 3 IGH  signals suggests an IGH rearrangement with a gene other than BCL2.  She received ibrutinib from 04/24/2019 - 05/30/2019.  She stopped ibrutinib secondary to hematuria.  WBC has been followed: 107,200 on 04/24/2019, 206,800 on 04/30/2019, 184,700 on 05/07/2019, 209,500 on 05/15/2019 and  237,000 on 05/21/2019.  She developed hematuria. CT hematuria work up on 06/01/2019 was negative.  There was no retroperitoneal or periportal lymphadenopathy. Cystoscopy on 06/13/2019 was normal.  Her last episode of gross hematuria was on 06/02/2019. Urinalysis on 06/07/2019 showed leukocytes 1+ and trace amounts of ketones and RBC. Urine culture revealed mixed urogenital flora.   She has a 35 pack year smoking history. She is in the low dose chest CT program. Low dose chest CTon 02/02/2018 revealed a new endobronchial lesionin the segmental bronchus to the medial segment of the right middle lobe. This may simply reflect an area of retained secretions, however, the possibility of an endobronchial neoplasm should be considered. Lung-RADS 4AS, suspicious.Low dose chest CTon 05/13/2020revealed Lung-RADS 2, benign appearance or behavior. Prior right middle lobe endobronchial lesion/mucous plugging wasno longer visualized. New mucous plugging in the right lower lobe.  Low dose chest CT on 10/11/2019 revealed complete atelectasis in the left lower lobe.  There was no pathologically enlarged mediastinal or hilar lymph nodes.  There was no axillary adenopathy.  There was mild diffuse bronchial wall thickening with mild centrilobular and paraseptal emphysema; imaging findings suggestive of underlying COPD.   Low dose chest CT on 10/11/2019 revealed Lung-RADS 0S, incomplete. Study was limited by complete atelectasis in the left lower lobe.  There was no pathologically enlarged mediastinal or hilar lymph nodes.  There was no axillary adenopathy.  There was aortic atherosclerosis, in addition to 3 vessel coronary artery disease. There was mild diffuse  bronchial wall thickening with mild centrilobular and paraseptal emphysema; imaging findings suggestive of underlying COPD. Chest CT without contrast on 01/09/2020 revealed interval re-expansion of the left lower lobe.  There was a stable 3 mm left lower lobe  pulmonary nodule, benign based on long-term stability. If the patient continues to meet screening eligibility criteria, repeat low-dose lung cancer screening CT in 1 year could be performed.  There was splenomegaly (partially visualized).  There was aortic atherosclerosis and emphysema.  She received her first dose of the [place] COVID-19 vaccine on 05/11/2019.  Symptomatically, ***  Plan: 1.   Labs today: CBC with diff, CMP, LDH, uric acid.   2. Chronic lymphocytic leukemia (CLL) Clinically, she remains fatigued. Exam reveals mild splenomegaly.  Rai stage II. 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  CCND1, ATM, chromosome 12, and TP53 were normal. She began ibrutinib on 04/24/2019 (discontinued on 05/30/2019).   She developed blood-streaked sputum (slight) then significant hematuria.   She declined further ibrutinib.  WBC appears to be increasing:   Hematocrit 43.1.  Hemoglobin 14.5.  MCV 97.5.  Platelets 130,000.  WBC   83,100 on 11/08/2019.   Hematocrit 42.4.  Hemoglobin 13.7.  MCV 99.5.  Platelets 199,000.  WBC 104,700 on 12/06/2019.   Hematocrit 42.4.  Hemoglobin 13.9.  MCV 98.8.  Platelets 143,000.  WBC 109,700 on 01/09/2020.   Hematocrit 42.6.  Hemoglobin 13.7.  MCV 89.5.  Platelets 129,000.  WBC 116,200 on 02/06/2020.  Symptomatically, she has fatigue and sweats which appear to correlate with her disease.  Previously, we discussed treatment options (acalabrutinib +/- obinutuzumab or venetoclax + Rituxan).   Re-review acalabrutinib recommendation.   Patient concerned about potential bleeding.  Re-address referral to [facility] to see Dr Katherine [name].   Patient in agreement 3.   Health maintenance Patientisinthelung cancer screening program.     Chest CT  without contrast on 01/09/2020 revealed interval  re-expansion of the left lower lobe.      There was a stable 3 mm left lower lobe pulmonary nodule.  Bilateral mammogram was due on 01/15/2020. 4.   RN:  Please call Dr Luis Abed coordinator at [facility] for a new appointment.  Dr Royce Macadamia is aware.  She has transportation with her daughters.  They prefer a Tuesday or a Thursday.  Mondays are the most difficult day secondary to their schedule. 5.   RTC in 1 month for MD assessment, labs (CBC with diff), and finalization of treatment plan.  I discussed the assessment and treatment plan with the patient.  The patient was provided an opportunity to ask questions and all were answered.  The patient agreed with the plan and demonstrated an understanding of the instructions.  The patient was advised to call back if the symptoms worsen or if the condition fails to improve as anticipated.  I provided *** minutes of face-to-face time during this this encounter and > 50% was spent counseling as documented under my assessment and plan.  Lequita Asal, MD, PhD    02/06/2020, 10:11 AM   I, Mirian Mo Tufford, am acting as a Education administrator for [employer]. Mike Gip, MD.   I, Melissa C. Mike Gip, MD, have reviewed the above documentation for accuracy and completeness, and I agree with the above.

## 2020-03-09 ENCOUNTER — Other Ambulatory Visit: Payer: Self-pay | Admitting: Hematology and Oncology

## 2020-03-09 DIAGNOSIS — C9110 Chronic lymphocytic leukemia of B-cell type not having achieved remission: Secondary | ICD-10-CM

## 2020-03-10 ENCOUNTER — Inpatient Hospital Stay: Payer: Medicare HMO

## 2020-03-10 ENCOUNTER — Inpatient Hospital Stay: Payer: Medicare HMO | Admitting: Hematology and Oncology

## 2020-03-10 DIAGNOSIS — C9110 Chronic lymphocytic leukemia of B-cell type not having achieved remission: Secondary | ICD-10-CM

## 2020-03-12 NOTE — Progress Notes (Signed)
[facility]  [address], [address] [place], [place] [address] Phone: [phone]  Fax: [phone]   Clinic Day: 03/13/20  Referring physician: Venita Lick, NP   Chief Complaint: Katherine [name] is a 80 y.o. female with chronic lymphocytic leukemia (CLL) who is seen for 1 month assessment and initiation of acalabrutinib (Calquence).  HPI: The patient was last seen in the medical oncology clinic on 02/06/2020. At that time, she was fatigued.  She described sweats 2-3 times/week.  Exam revealed a stable spleen tip. Hematocrit was 42.6, hemoglobin 13.7, platelets 129,000, WBC 116,200. BMP was normal. Uric acid was 4.5.  We discussed treatment options.  She was referred to [facility] for a second opinion.  The patient saw Dr. Gloriann Loan on 02/27/2020. It was felt that it was important to restart treatment. Acalabrutinib was recommended indefinitely until it was no longer effective or tolerated. An alternative strategy would be to treat with Venetoclax +/- obinutuzumab. The patient elected to proceed with acalabrutinib.  They discussed that she would have to stop her PPI.  [facility] pathologist smear review on 02/27/2020 revealed absolute lymphocytosis with smudge cells and rare prolymphocytes, compatible with reported history of chronic lymphocytic leukemia. LDH was 339. Alkaline phosphatase was 161.  During the interim, she has been fine. Her energy fluctuates and sometimes she has to sit down when she is doing something because of back pain. She still has mild sweats. She is eating the same as usual. She has two deep bruises on her left leg. She denies nose bleeds and gum bleeds.   Past Medical History:  Diagnosis Date  . Allergy   . Anxiety   . Depression   . GERD (gastroesophageal reflux disease)   . Hemorrhoids   . Hypertension   . Leukemia, lymphoid ([place])    CLL  . Lobar pneumonia ([place])   . Osteopenia   . Personal history of chemotherapy     Past  Surgical History:  Procedure Laterality Date  . ABDOMINAL HYSTERECTOMY    . APPENDECTOMY    . BREAST BIOPSY Left    bx x 3-neg  . COLON SURGERY     sigmoid resection  . COLONOSCOPY     2007, 2012  . COLONOSCOPY WITH PROPOFOL N/A 09/17/2015   Procedure: COLONOSCOPY WITH PROPOFOL;  Surgeon: Robert Bellow, MD;  Location: [facility] ENDOSCOPY;  Service: Endoscopy;  Laterality: N/A;  . OOPHORECTOMY    . SPINE SURGERY     L4-5    Family History  Problem Relation Age of Onset  . Osteoporosis Mother   . Hypertension Father   . Heart attack Father   . Stroke Maternal Grandfather   . Breast cancer Sister 26    Social History:  reports that she has been smoking cigarettes. She has a 35.00 pack-year smoking history. She has never used smokeless tobacco. She reports that she does not drink alcohol and does not use drugs. Currently smokes 5 cigarettes daily, but is working toward quitting. She previously smoked a pack a day for a "longtime".She lives in [place], [place].The patient is alone today.   Allergies:  Allergies  Allergen Reactions  . Augmentin [Amoxicillin-Pot Clavulanate] Swelling    tongue  . Biaxin [Clarithromycin] Swelling and Rash    tongue    Current Medications: Current Outpatient Medications  Medication Sig Dispense Refill  . albuterol (VENTOLIN HFA) 108 (90 Base) MCG/ACT inhaler TAKE 2 PUFFS BY MOUTH EVERY 6 HOURS AS NEEDED FOR WHEEZE OR SHORTNESS OF BREATH  108 g 9  . atorvastatin (LIPITOR) 10 MG tablet Take 1 tablet (10 mg total) by mouth daily. 90 tablet 4  . benazepril (LOTENSIN) 40 MG tablet Take 1 tablet (40 mg total) by mouth daily. 90 tablet 4  . calcium-vitamin D (OSCAL WITH D) 500-200 MG-UNIT tablet Take 1 tablet by mouth.    . diphenhydrAMINE (BENADRYL) 25 MG tablet Take 25 mg by mouth at bedtime as needed.    Marland Kitchen LORazepam (ATIVAN) 1 MG tablet Take 0.5 tablets (0.5 mg total) by mouth 2 (two) times daily as needed for anxiety. 30 tablet 4  . Multiple  Vitamins-Minerals (MULTIVITAMIN WITH MINERALS) tablet Take 1 tablet by mouth daily.    Marland Kitchen PARoxetine (PAXIL) 30 MG tablet Take 1 tablet (30 mg total) by mouth daily. 90 tablet 4  . Triamcinolone Acetonide (NASACORT AQ NA) Place 2 Doses into the nose daily as needed.     Marland Kitchen acalabrutinib (CALQUENCE) 100 MG capsule Take 1 capsule (100 mg total) by mouth 2 (two) times daily. (Patient not taking: Reported on 03/13/2020) 60 capsule 1  . Pseudoephedrine-DM-GG (ROBITUSSIN CF PO) Take by mouth as needed.  (Patient not taking: Reported on 03/13/2020)     No current facility-administered medications for this visit.    Review of Systems  Constitutional: Positive for diaphoresis (non-drenching, 2-3x per week) and weight loss (2 lbs). Negative for chills, fever and malaise/fatigue (energy fluctuates).       Feels "fine."  HENT: Negative for congestion, ear discharge, ear pain, hearing loss, nosebleeds, sinus pain, sore throat and tinnitus.   Eyes: Negative for blurred vision.  Respiratory: Negative for cough, hemoptysis, sputum production and shortness of breath.   Cardiovascular: Negative for chest pain, palpitations and leg swelling.  Gastrointestinal: Negative for abdominal pain, blood in stool, constipation, diarrhea, heartburn, melena, nausea and vomiting.       Diet is stable.  Genitourinary: Negative for dysuria, frequency, hematuria and urgency.  Musculoskeletal: Positive for back pain (when active). Negative for joint pain, myalgias and neck pain.  Skin: Negative for itching and rash.  Neurological: Negative for dizziness, tingling, sensory change, weakness and headaches.  Endo/Heme/Allergies: Negative for environmental allergies. Does not bruise/bleed easily (2 bruises on left leg).  Psychiatric/Behavioral: Negative for depression and memory loss. The patient is not nervous/anxious and does not have insomnia.   All other systems reviewed and are negative.  Performance status (ECOG):  1  Vitals Blood pressure (!) 117/52, pulse 75, temperature (!) 97 F (36.1 C), temperature source Tympanic, resp. rate 18, weight 153 lb (69.4 kg), SpO2 95 %.   Physical Exam Vitals and nursing note reviewed.  Constitutional:      General: She is not in acute distress.    Appearance: She is not diaphoretic.  HENT:     Head: Normocephalic and atraumatic.     Comments: Short gray hair.    Mouth/Throat:     Mouth: Mucous membranes are moist.     Pharynx: Oropharynx is clear.  Eyes:     General: No scleral icterus.    Extraocular Movements: Extraocular movements intact.     Conjunctiva/sclera: Conjunctivae normal.     Pupils: Pupils are equal, round, and reactive to light.     Comments: Glasses. Brown eyes.  Cardiovascular:     Rate and Rhythm: Normal rate and regular rhythm.     Heart sounds: Normal heart sounds. No murmur heard.   Pulmonary:     Effort: Pulmonary effort is normal. No respiratory distress.  Breath sounds: Normal breath sounds. No wheezing or rales.  Chest:     Chest wall: No tenderness.  Breasts:     Right: No axillary adenopathy or supraclavicular adenopathy.     Left: No axillary adenopathy or supraclavicular adenopathy.    Abdominal:     General: Bowel sounds are normal. There is no distension.     Palpations: Abdomen is soft. There is splenomegaly (spleen tip barely palpable). There is no mass.     Tenderness: There is no abdominal tenderness. There is no guarding or rebound.  Musculoskeletal:        General: Tenderness (BLE) present. No swelling. Normal range of motion.     Cervical back: Normal range of motion and neck supple.     Right lower leg: Edema (ankle and top of foot, (L>R)) present.     Left lower leg: Edema (ankle and top of foot, (L>R)) present.  Lymphadenopathy:     Head:     Right side of head: No preauricular, posterior auricular or occipital adenopathy.     Left side of head: No preauricular, posterior auricular or occipital  adenopathy.     Cervical: No cervical adenopathy.     Upper Body:     Right upper body: No supraclavicular or axillary adenopathy.     Left upper body: No supraclavicular or axillary adenopathy.     Lower Body: No right inguinal adenopathy. No left inguinal adenopathy.  Skin:    General: Skin is warm and dry.  Neurological:     Mental Status: She is alert and oriented to person, place, and time.  Psychiatric:        Behavior: Behavior normal.        Thought Content: Thought content normal.        Judgment: Judgment normal.     Appointment on 03/13/2020  Component Date Value Ref Range Status  . Uric Acid, Serum 03/13/2020 5.2  2.5 - 7.1 mg/dL Final   Performed at [facility], [address] [place], [place] [address]  . LDH 03/13/2020 166  98 - 192 U/L Final   Performed at [facility], [address] [place], [place] [address]  . Sodium 03/13/2020 139  135 - 145 mmol/L Final  . Potassium 03/13/2020 4.5  3.5 - 5.1 mmol/L Final  . Chloride 03/13/2020 103  98 - 111 mmol/L Final  . CO2 03/13/2020 29  22 - 32 mmol/L Final  . Glucose, Bld 03/13/2020 90  70 - 99 mg/dL Final   Glucose reference range applies only to samples taken after fasting for at least 8 hours.  . BUN 03/13/2020 17  8 - 23 mg/dL Final  . Creatinine, Ser 03/13/2020 0.97  0.44 - 1.00 mg/dL Final  . Calcium 03/13/2020 9.0  8.9 - 10.3 mg/dL Final  . Total Protein 03/13/2020 6.6  6.5 - 8.1 g/dL Final  . Albumin 03/13/2020 4.5  3.5 - 5.0 g/dL Final  . AST 03/13/2020 22  15 - 41 U/L Final  . ALT 03/13/2020 13  0 - 44 U/L Final  . Alkaline Phosphatase 03/13/2020 97  38 - 126 U/L Final  . Total Bilirubin 03/13/2020 0.6  0.3 - 1.2 mg/dL Final  . GFR, Estimated 03/13/2020 59* >60 mL/min Final   Comment: (NOTE) Calculated using the CKD-EPI Creatinine Equation (2021)   . Anion gap 03/13/2020 7  5 - 15 Final   Performed at [facility], [address] [place], [place]  [address]  .  WBC 03/13/2020 144.3* 4.0 - 10.5 K/uL Final   This critical result has verified and been called to [facility] by Woodfin Ganja on 01 20 2022 at 1517, and has been read back.   Marland Kitchen RBC 03/13/2020 4.09  3.87 - 5.11 MIL/uL Final  . Hemoglobin 03/13/2020 13.1  12.0 - 15.0 g/dL Final  . HCT 03/13/2020 40.6  36.0 - 46.0 % Final  . MCV 03/13/2020 99.3  80.0 - 100.0 fL Final  . MCH 03/13/2020 32.0  26.0 - 34.0 pg Final  . MCHC 03/13/2020 32.3  30.0 - 36.0 g/dL Final  . RDW 03/13/2020 14.6  11.5 - 15.5 % Final  . Platelets 03/13/2020 130* 150 - 400 K/uL Final  . nRBC 03/13/2020 0.0  0.0 - 0.2 % Final  . Neutrophils Relative % 03/13/2020 3  % Final  . Neutro Abs 03/13/2020 4.2  1.7 - 7.7 K/uL Final  . Lymphocytes Relative 03/13/2020 92  % Final  . Lymphs Abs 03/13/2020 131.5* 0.7 - 4.0 K/uL Final  . Monocytes Relative 03/13/2020 5  % Final  . Monocytes Absolute 03/13/2020 7.8* 0.1 - 1.0 K/uL Final  . Eosinophils Relative 03/13/2020 0  % Final  . Eosinophils Absolute 03/13/2020 0.5  0.0 - 0.5 K/uL Final  . Basophils Relative 03/13/2020 0  % Final  . Basophils Absolute 03/13/2020 0.1  0.0 - 0.1 K/uL Final  . WBC Morphology 03/13/2020 Abnormal lymphocytes present   Final  . RBC Morphology 03/13/2020 MIXED RBC POPULATION   Final  . Smear Review 03/13/2020 PLATELET COUNT CONFIRMED BY SMEAR   Final  . Immature Granulocytes 03/13/2020 0  % Final  . Abs Immature Granulocytes 03/13/2020 0.23* 0.00 - 0.07 K/uL Final  . Smudge Cells 03/13/2020 PRESENT   Final   Performed at [facility], [address] [place], [place] [address]    Assessment:  Sande Pickert is a 80 y.o. female with Rai stage II chronic lymphocytic leukemia(CLL). WBC has ranged between 22,000 - 101,7000 since 05/2011.  She has received Rituxanx 2 four week cycles (06/27/2015 and 05/27/2017). Hepatitis B surface antigen and hepatitis B surface antibody were negative on 07/04/2015.  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  CCND1, ATM, chromosome 12, and TP53 were normal.   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  There was a CD5 and CD23 positive monoclonal B cell population with lambda light chain restriction, negative for FMC7 and CD38, representing  88% of leukocytes, >5,000/uL. There was no loss of, or aberrant expression of, the pan T cell  antigens to  suggest a neoplastic T cell process. CD4:CD8 ratio 2.0  No circulating blasts were detected. There was no immunophenotypic evidence of abnormal myeloid maturation.  IGH/BCL2 by FISH revealed 86.5% of nuclei positive for 3 IgH signals.  There were 2BCL2 signals. The IGH/BCL2 fusion signals associated with follicular lymphoma, and to a lesser extent diffuse large cell  lymphoma, were not observed. The presence of 3 IGH  signals suggests an IGH rearrangement with a gene other than BCL2.  She received ibrutinib from 04/24/2019 - 05/30/2019.  She stopped ibrutinib secondary to hematuria.  WBC has been followed: 107,200 on 04/24/2019, 206,800 on 04/30/2019, 184,700 on 05/07/2019, 209,500 on 05/15/2019 and 237,000 on 05/21/2019.  She developed hematuria. CT hematuria work up on 06/01/2019 was negative.  There was no  retroperitoneal or periportal lymphadenopathy. Cystoscopy on 06/13/2019 was normal.  Her last episode of gross hematuria was on 06/02/2019. Urinalysis on 06/07/2019 showed leukocytes 1+ and trace amounts of ketones and RBC. Urine culture revealed mixed urogenital flora.   She has a 35 pack year smoking history. She is in the low dose chest CT program. Low dose chest CTon 02/02/2018 revealed a new endobronchial lesionin the segmental bronchus to the medial segment of the right middle lobe. This may simply reflect an area of retained secretions, however, the possibility of an endobronchial neoplasm should  be considered. Lung-RADS 4AS, suspicious.Low dose chest CTon 05/13/2020revealed Lung-RADS 2, benign appearance or behavior. Prior right middle lobe endobronchial lesion/mucous plugging wasno longer visualized. New mucous plugging in the right lower lobe.  Low dose chest CT on 10/11/2019 revealed complete atelectasis in the left lower lobe.  There was no pathologically enlarged mediastinal or hilar lymph nodes.  There was no axillary adenopathy.  There was mild diffuse bronchial wall thickening with mild centrilobular and paraseptal emphysema; imaging findings suggestive of underlying COPD.   Low dose chest CT on 10/11/2019 revealed Lung-RADS 0S, incomplete. Study was limited by complete atelectasis in the left lower lobe.  There was no pathologically enlarged mediastinal or hilar lymph nodes.  There was no axillary adenopathy.  There was aortic atherosclerosis, in addition to 3 vessel coronary artery disease. There was mild diffuse bronchial wall thickening with mild centrilobular and paraseptal emphysema; imaging findings suggestive of underlying COPD. Chest CT without contrast on 01/09/2020 revealed interval re-expansion of the left lower lobe.  There was a stable 3 mm left lower lobe pulmonary nodule, benign based on long-term stability. If the patient continues to meet screening eligibility criteria, repeat low-dose lung cancer screening CT in 1 year could be performed.  There was splenomegaly (partially visualized).  There was aortic atherosclerosis and emphysema.  She received her first dose of the [place] COVID-19 vaccine on 05/11/2019.  Symptomatically, her energy fluctuates. She still has mild sweats. She has lost 2 pounds.  Exam reveals no adenopathy or hepatosplenomegaly.  WBC is 144,300.  Plan: 1.   Labs today: CBC with diff, CMP, LDH, uric acid. 2. Chronic lymphocytic leukemia (CLL) Clinically, her energy level fluctuates.  She has mild sweats and a 2 pound weight  loss. Exam feels a palpable spleen tip.  Rai stage II. 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  CCND1, ATM, chromosome 12, and TP53 were normal. She began ibrutinib on 04/24/2019 (discontinued on 05/30/2019).   She developed blood-streaked sputum (slight) then significant hematuria.   She declined further ibrutinib.  She has progressive leukocytosis and mild B symptoms.  Discuss interval second opinion Dr. Hilma Favors at [facility].   She agrees with initiation of acalabrutinib (Calquence).   Potential side effects reviewed.   Discuss plan for monitoring.  Begin acalabrutinib tomorrow. 3.   Health maintenance Patient is on the low-dose chest CT program.   Chest CT  without contrast on 01/09/2020 revealed interval re-expansion of the left lower lobe.      There was a stable 3 mm left lower lobe pulmonary nodule.    Next imaging on 01/08/2021. Bilateral mammogram was due on 01/15/2020. 4.   RTC in 2 weeks for MD assessment and labs (CBC with differential, BMP, uric acid).  I discussed the assessment and treatment plan with the patient.  The patient was provided an opportunity to ask questions and  all were answered.  The patient agreed with the plan and demonstrated an understanding of the instructions.  The patient was advised to call back if the symptoms worsen or if the condition fails to improve as anticipated.   Lequita Asal, MD, PhD    03/13/2020, 3:35 PM   I, Mirian Mo Tufford, am acting as a Education administrator for [employer]. Mike Gip, MD.   I, Joushua Dugar C. Mike Gip, MD, have reviewed the above documentation for accuracy and completeness, and I agree with the above.

## 2020-03-13 ENCOUNTER — Other Ambulatory Visit: Payer: Self-pay

## 2020-03-13 ENCOUNTER — Encounter: Payer: Self-pay | Admitting: Hematology and Oncology

## 2020-03-13 ENCOUNTER — Inpatient Hospital Stay: Payer: Medicare HMO | Attending: Hematology and Oncology

## 2020-03-13 ENCOUNTER — Inpatient Hospital Stay (HOSPITAL_BASED_OUTPATIENT_CLINIC_OR_DEPARTMENT_OTHER): Payer: Medicare HMO | Admitting: Hematology and Oncology

## 2020-03-13 VITALS — BP 117/52 | HR 75 | Temp 97.0°F | Resp 18 | Wt 153.0 lb

## 2020-03-13 DIAGNOSIS — C9110 Chronic lymphocytic leukemia of B-cell type not having achieved remission: Secondary | ICD-10-CM | POA: Diagnosis not present

## 2020-03-13 DIAGNOSIS — Z90710 Acquired absence of both cervix and uterus: Secondary | ICD-10-CM | POA: Insufficient documentation

## 2020-03-13 DIAGNOSIS — M549 Dorsalgia, unspecified: Secondary | ICD-10-CM | POA: Diagnosis not present

## 2020-03-13 DIAGNOSIS — F17210 Nicotine dependence, cigarettes, uncomplicated: Secondary | ICD-10-CM | POA: Diagnosis not present

## 2020-03-13 DIAGNOSIS — Z8701 Personal history of pneumonia (recurrent): Secondary | ICD-10-CM | POA: Diagnosis not present

## 2020-03-13 DIAGNOSIS — R69 Illness, unspecified: Secondary | ICD-10-CM | POA: Diagnosis not present

## 2020-03-13 DIAGNOSIS — Z7189 Other specified counseling: Secondary | ICD-10-CM | POA: Diagnosis not present

## 2020-03-13 LAB — COMPREHENSIVE METABOLIC PANEL
ALT: 13 U/L (ref 0–44)
AST: 22 U/L (ref 15–41)
Albumin: 4.5 g/dL (ref 3.5–5.0)
Alkaline Phosphatase: 97 U/L (ref 38–126)
Anion gap: 7 (ref 5–15)
BUN: 17 mg/dL (ref 8–23)
CO2: 29 mmol/L (ref 22–32)
Calcium: 9 mg/dL (ref 8.9–10.3)
Chloride: 103 mmol/L (ref 98–111)
Creatinine, Ser: 0.97 mg/dL (ref 0.44–1.00)
GFR, Estimated: 59 mL/min — ABNORMAL LOW (ref >60)
Glucose, Bld: 90 mg/dL (ref 70–99)
Potassium: 4.5 mmol/L (ref 3.5–5.1)
Sodium: 139 mmol/L (ref 135–145)
Total Bilirubin: 0.6 mg/dL (ref 0.3–1.2)
Total Protein: 6.6 g/dL (ref 6.5–8.1)

## 2020-03-13 LAB — CBC WITH DIFFERENTIAL/PLATELET
Abs Immature Granulocytes: 0.23 10*3/uL — ABNORMAL HIGH (ref 0.00–0.07)
Basophils Absolute: 0.1 10*3/uL (ref 0.0–0.1)
Basophils Relative: 0 %
Eosinophils Absolute: 0.5 10*3/uL (ref 0.0–0.5)
Eosinophils Relative: 0 %
HCT: 40.6 % (ref 36.0–46.0)
Hemoglobin: 13.1 g/dL (ref 12.0–15.0)
Immature Granulocytes: 0 %
Lymphocytes Relative: 92 %
Lymphs Abs: 131.5 10*3/uL — ABNORMAL HIGH (ref 0.7–4.0)
MCH: 32 pg (ref 26.0–34.0)
MCHC: 32.3 g/dL (ref 30.0–36.0)
MCV: 99.3 fL (ref 80.0–100.0)
Monocytes Absolute: 7.8 10*3/uL — ABNORMAL HIGH (ref 0.1–1.0)
Monocytes Relative: 5 %
Neutro Abs: 4.2 10*3/uL (ref 1.7–7.7)
Neutrophils Relative %: 3 %
Platelets: 130 10*3/uL — ABNORMAL LOW (ref 150–400)
RBC: 4.09 MIL/uL (ref 3.87–5.11)
RDW: 14.6 % (ref 11.5–15.5)
WBC Morphology: ABNORMAL
WBC: 144.3 10*3/uL (ref 4.0–10.5)
nRBC: 0 % (ref 0.0–0.2)

## 2020-03-13 LAB — URIC ACID: Uric Acid, Serum: 5.2 mg/dL (ref 2.5–7.1)

## 2020-03-13 LAB — LACTATE DEHYDROGENASE: LDH: 166 U/L (ref 98–192)

## 2020-03-13 NOTE — Patient Instructions (Signed)
  Begin acalabrutinib (calquence) tomorrow.  Call if any concerns.

## 2020-03-13 NOTE — Progress Notes (Signed)
Patient here for oncology follow-up appointment, expresses concerns of oassional shortness of breath.

## 2020-03-27 NOTE — Progress Notes (Signed)
[facility]  [address], [address] [place], [place] [address] Phone: [phone]  Fax: [phone]  Clinic Day: 03/31/20   Referring physician: Venita Lick, NP   Chief Complaint: Katherine [name] is a 80 y.o. female with chronic lymphocytic leukemia (CLL) who is seen for 2 week assessment on acalabrutinib (Calquence).  HPI: The patient was last seen in the medical oncology clinic on 03/13/2020. At that time, she described a fluctuating energy level.  She had mild sweats and had lost 2 pounds.  Exam revealed no adenopathy or hepatosplenomegaly. Hematocrit was 40.6, hemoglobin 13.1, platelets 130,000, WBC 144,300. Uric acid was 5.2. LDH was 166.  She was to begin acalabrutinib on 03/14/2020.  During the interim, she has felt "good".   She describes one episode of chills.  She had 2-3 episodes of diarrhea.  Headache was short-lived.  Sweats are less.  She takes a nap in the afternoon.  She is able to do house chores.  She denies any bleeding.  She describes an episode of slight blood in a mucousy cough.  She denies any fevers or infections.   Past Medical History:  Diagnosis Date  . Allergy   . Anxiety   . Depression   . GERD (gastroesophageal reflux disease)   . Hemorrhoids   . Hypertension   . Leukemia, lymphoid ([place])    CLL  . Lobar pneumonia ([place])   . Osteopenia   . Personal history of chemotherapy     Past Surgical History:  Procedure Laterality Date  . ABDOMINAL HYSTERECTOMY    . APPENDECTOMY    . BREAST BIOPSY Left    bx x 3-neg  . COLON SURGERY     sigmoid resection  . COLONOSCOPY     2007, 2012  . COLONOSCOPY WITH PROPOFOL N/A 09/17/2015   Procedure: COLONOSCOPY WITH PROPOFOL;  Surgeon: Robert Bellow, MD;  Location: [facility];  Service: Endoscopy;  Laterality: N/A;  . OOPHORECTOMY    . SPINE SURGERY     L4-5    Family History  Problem Relation Age of Onset  . Osteoporosis Mother   . Hypertension Father   . Heart  attack Father   . Stroke Maternal Grandfather   . Breast cancer Sister 50   Social History:  reports that she has been smoking cigarettes. She has a 35.00 pack-year smoking history. She has never used smokeless tobacco. She reports that she does not drink alcohol and does not use drugs. Currently smokes 5 cigarettes daily, but is working toward quitting. She previously smoked a pack a day for a "longtime".She lives in [place], [place].The patient is alone today.   Allergies:  Allergies  Allergen Reactions  . Augmentin [Amoxicillin-Pot Clavulanate] Swelling    tongue  . Biaxin [Clarithromycin] Swelling and Rash    tongue   Current Medications: Current Outpatient Medications  Medication Sig Dispense Refill  . acalabrutinib (CALQUENCE) 100 MG capsule Take 1 capsule (100 mg total) by mouth 2 (two) times daily. 60 capsule 1  . albuterol (VENTOLIN HFA) 108 (90 Base) MCG/ACT inhaler TAKE 2 PUFFS BY MOUTH EVERY 6 HOURS AS NEEDED FOR WHEEZE OR SHORTNESS OF BREATH 108 g 9  . atorvastatin (LIPITOR) 10 MG tablet Take 1 tablet (10 mg total) by mouth daily. 90 tablet 4  . benazepril (LOTENSIN) 40 MG tablet Take 1 tablet (40 mg total) by mouth daily. 90 tablet 4  . diphenhydrAMINE (BENADRYL) 25 MG tablet Take 25 mg by mouth at bedtime as needed.    Marland Kitchen  LORazepam (ATIVAN) 1 MG tablet Take 0.5 tablets (0.5 mg total) by mouth 2 (two) times daily as needed for anxiety. 30 tablet 4  . Multiple Vitamins-Minerals (MULTIVITAMIN WITH MINERALS) tablet Take 1 tablet by mouth daily.    Marland Kitchen PARoxetine (PAXIL) 30 MG tablet Take 1 tablet (30 mg total) by mouth daily. 90 tablet 4  . Triamcinolone Acetonide (NASACORT AQ NA) Place 2 Doses into the nose daily as needed.      No current facility-administered medications for this visit.    Review of Systems  Constitutional: Positive for diaphoresis (less) and weight loss (3 pounds). Negative for chills, fever and malaise/fatigue (energy fluctuates).       Feels "fine."   HENT: Negative for congestion, ear discharge, ear pain, hearing loss, nosebleeds, sinus pain, sore throat and tinnitus.   Eyes: Negative for blurred vision.  Respiratory: Negative for cough, hemoptysis, sputum production and shortness of breath.   Cardiovascular: Negative for chest pain, palpitations and leg swelling.  Gastrointestinal: Negative for abdominal pain, blood in stool, constipation, diarrhea, heartburn, melena, nausea and vomiting.       Appetite is good.  Genitourinary: Negative for dysuria, frequency, hematuria and urgency.  Musculoskeletal: Positive for back pain (soreness when sitting down). Negative for joint pain, myalgias and neck pain.  Skin: Negative for itching and rash.  Neurological: Negative for dizziness, tingling, sensory change, weakness and headaches.  Endo/Heme/Allergies: Negative for environmental allergies. Does not bruise/bleed easily (bruising on hands).  Psychiatric/Behavioral: Negative for depression and memory loss. The patient is not nervous/anxious and does not have insomnia.   All other systems reviewed and are negative.  Performance status (ECOG): 1  Vitals Blood pressure 121/62, pulse 89, temperature (!) 97 F (36.1 C), temperature source Tympanic, resp. rate 18, weight 150 lb 14.5 oz (68.5 kg), SpO2 94 %.   Physical Exam Vitals and nursing note reviewed.  Constitutional:      General: She is not in acute distress.    Appearance: She is not diaphoretic.  HENT:     Head: Normocephalic and atraumatic.     Comments: Short gray hair.    Mouth/Throat:     Mouth: Mucous membranes are moist.     Pharynx: Oropharynx is clear.  Eyes:     General: No scleral icterus.    Extraocular Movements: Extraocular movements intact.     Conjunctiva/sclera: Conjunctivae normal.     Pupils: Pupils are equal, round, and reactive to light.     Comments: Glasses. Brown eyes.  Cardiovascular:     Rate and Rhythm: Normal rate and regular rhythm.     Heart  sounds: Normal heart sounds. No murmur heard.   Pulmonary:     Effort: Pulmonary effort is normal. No respiratory distress.     Breath sounds: Normal breath sounds. No wheezing or rales.  Chest:     Chest wall: No tenderness.  Breasts:     Right: No axillary adenopathy or supraclavicular adenopathy.     Left: No axillary adenopathy or supraclavicular adenopathy.    Abdominal:     General: Bowel sounds are normal. There is no distension.     Palpations: Abdomen is soft. There is splenomegaly (spleen tip barely palpable). There is no mass.     Tenderness: There is no abdominal tenderness. There is no guarding or rebound.  Musculoskeletal:        General: Tenderness (BLE) present. No swelling. Normal range of motion.     Cervical back: Normal range of motion and  neck supple.     Right lower leg: Edema (1+ ankle and top of foot) present.     Left lower leg: Edema (2+ ankle and top of foot) present.  Lymphadenopathy:     Head:     Right side of head: No preauricular, posterior auricular or occipital adenopathy.     Left side of head: No preauricular, posterior auricular or occipital adenopathy.     Cervical: No cervical adenopathy.     Upper Body:     Right upper body: No supraclavicular or axillary adenopathy.     Left upper body: No supraclavicular or axillary adenopathy.     Lower Body: No right inguinal adenopathy. No left inguinal adenopathy.  Skin:    General: Skin is warm and dry.  Neurological:     Mental Status: She is alert and oriented to person, place, and time.  Psychiatric:        Behavior: Behavior normal.        Thought Content: Thought content normal.        Judgment: Judgment normal.     Appointment on 03/31/2020  Component Date Value Ref Range Status  . Sodium 03/31/2020 138  135 - 145 mmol/L Final  . Potassium 03/31/2020 4.1  3.5 - 5.1 mmol/L Final  . Chloride 03/31/2020 102  98 - 111 mmol/L Final  . CO2 03/31/2020 24  22 - 32 mmol/L Final  . Glucose,  Bld 03/31/2020 108* 70 - 99 mg/dL Final   Glucose reference range applies only to samples taken after fasting for at least 8 hours.  . BUN 03/31/2020 10  8 - 23 mg/dL Final  . Creatinine, Ser 03/31/2020 0.83  0.44 - 1.00 mg/dL Final  . Calcium 03/31/2020 9.3  8.9 - 10.3 mg/dL Final  . GFR, Estimated 03/31/2020 >60  >60 mL/min Final   Comment: (NOTE) Calculated using the CKD-EPI Creatinine Equation (2021)   . Anion gap 03/31/2020 12  5 - 15 Final   Performed at [facility], [address] [place], [place] [address]  . WBC 03/31/2020 326.9* 4.0 - 10.5 K/uL Final   This critical result has verified and been called to [facility] by Woodfin Ganja on 02 07 2022 at 1443, and has been read back.   Marland Kitchen RBC 03/31/2020 4.06  3.87 - 5.11 MIL/uL Final  . Hemoglobin 03/31/2020 11.6* 12.0 - 15.0 g/dL Final  . HCT 03/31/2020 43.0  36.0 - 46.0 % Final  . MCV 03/31/2020 105.9* 80.0 - 100.0 fL Final  . MCH 03/31/2020 28.6  26.0 - 34.0 pg Final  . MCHC 03/31/2020 27.0* 30.0 - 36.0 g/dL Final  . RDW 03/31/2020 Not Measured  11.5 - 15.5 % Final  . Platelets 03/31/2020 144* 150 - 400 K/uL Final  . nRBC 03/31/2020 0.0  0.0 - 0.2 % Final  . Neutrophils Relative % 03/31/2020 3  % Final  . Neutro Abs 03/31/2020 9.8* 1.7 - 7.7 K/uL Final  . Lymphocytes Relative 03/31/2020 96  % Final  . Lymphs Abs 03/31/2020 313.8* 0.7 - 4.0 K/uL Final  . Monocytes Relative 03/31/2020 1  % Final  . Monocytes Absolute 03/31/2020 3.3* 0.1 - 1.0 K/uL Final  . Eosinophils Relative 03/31/2020 0  % Final  . Eosinophils Absolute 03/31/2020 0.0  0.0 - 0.5 K/uL Final  . Basophils Relative 03/31/2020 0  % Final  . Basophils Absolute 03/31/2020 0.0  0.0 - 0.1 K/uL Final  . WBC Morphology 03/31/2020 Abnormal lymphocytes present  Final   SMUDGE CELLS  . RBC Morphology 03/31/2020 NO SCHISTOCYTES SEEN   Final  . Abs Immature Granulocytes 03/31/2020 0.00  0.00 - 0.07 K/uL Final  . Smudge Cells 03/31/2020 PRESENT   Final  .  Tear Drop Cells 03/31/2020 PRESENT   Final  . Ovalocytes 03/31/2020 PRESENT   Final   Performed at [facility], [address], [place], [place] [address]  Orders Only on 03/31/2020  Component Date Value Ref Range Status  . Uric Acid, Serum 03/31/2020 4.3  2.5 - 7.1 mg/dL Final   Performed at [facility], [address] [place], [place] [address]    Assessment:  Katherine [name] is a 80 y.o. female with chronic lymphocytic leukemia(CLL). WBC has ranged between 22,000 - 101,7000 since 05/2011.  She has received Rituxanx 2 four week cycles (06/27/2015 and 05/27/2017). Hepatitis B surface antigen and hepatitis B surface antibody were negative on 07/04/2015.  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  CCND1, ATM, chromosome 12, and TP53 were normal.   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  There was a CD5 and CD23 positive monoclonal B cell population with lambda light chain restriction, negative for FMC7 and CD38, representing  88% of leukocytes, >5,000/uL. There was no loss of, or aberrant expression of, the pan T cell  antigens to  suggest a neoplastic T cell process. CD4:CD8 ratio 2.0  No circulating blasts were detected. There was no immunophenotypic evidence of abnormal myeloid maturation.  IGH/BCL2 by FISH revealed 86.5% of nuclei positive for 3 IgH signals.  There were 2BCL2 signals. The IGH/BCL2 fusion signals associated with follicular lymphoma, and to a lesser extent diffuse large cell  lymphoma, were not observed. The presence of 3 IGH  signals suggests an IGH rearrangement with a gene other than BCL2.  She received ibrutinib from 04/24/2019 - 05/30/2019.  She stopped ibrutinib secondary to hematuria.  She began acalabrutinib on 03/14/2020.  WBC has been followed: 107,200 on 04/24/2019, 206,800 on  04/30/2019, 184,700 on 05/07/2019, 209,500 on 05/15/2019, 237,000 on 05/21/2019, 81,400 on 06/19/2019, 144,300 on 03/13/2020 and 326,900 on 03/31/2020.  She developed hematuria. CT hematuria work up on 06/01/2019 was negative.  There was no retroperitoneal or periportal lymphadenopathy. Cystoscopy on 06/13/2019 was normal.  Her last episode of gross hematuria was on 06/02/2019. Urinalysis on 06/07/2019 showed leukocytes 1+ and trace amounts of ketones and RBC. Urine culture revealed mixed urogenital flora.   She has a 35 pack year smoking history. She is in the low dose chest CT program. Low dose chest CTon 02/02/2018 revealed a new endobronchial lesionin the segmental bronchus to the medial segment of the right middle lobe. This may simply reflect an area of retained secretions, however, the possibility of an endobronchial neoplasm should be considered. Lung-RADS 4AS, suspicious.Low dose chest CTon 05/13/2020revealed Lung-RADS 2, benign appearance or behavior. Prior right middle lobe endobronchial lesion/mucous plugging wasno longer visualized. New mucous plugging in the right lower lobe.  Low dose chest CT on 10/11/2019 revealed complete atelectasis in the left lower lobe.  There was no pathologically enlarged mediastinal or hilar lymph nodes.  There was no axillary adenopathy.  There was mild diffuse bronchial wall thickening with mild centrilobular and paraseptal emphysema; imaging findings suggestive of underlying COPD.   Low dose chest CT on 10/11/2019 revealed Lung-RADS 0S, incomplete. Study was limited by complete  atelectasis in the left lower lobe.  There was no pathologically enlarged mediastinal or hilar lymph nodes.  There was no axillary adenopathy.  There was aortic atherosclerosis, in addition to 3 vessel coronary artery disease. There was mild diffuse bronchial wall thickening with mild centrilobular and paraseptal emphysema; imaging findings suggestive of underlying COPD. Chest CT  without contrast on 01/09/2020 revealed interval re-expansion of the left lower lobe.  There was a stable 3 mm left lower lobe pulmonary nodule, benign based on long-term stability. If the patient continues to meet screening eligibility criteria, repeat low-dose lung cancer screening CT in 1 year could be performed.  There was splenomegaly (partially visualized).  There was aortic atherosclerosis and emphysema.  She received her first dose of the [place] COVID-19 vaccine on 05/11/2019.  Symptomatically, she has felt "good".   Sweats are less.  She takes a nap in the afternoon.  She is able to do house chores.  She denies any bleeding except for an episode of slight blood in a mucousy cough.  She denies any fevers or infections.  WBC is 326,900.  Plan: 1.   Labs today: CBC with diff, CMP, uric acid. 2. [place] stage II chronic lymphocytic leukemia (CLL) Clinically, she appears to be doing well. Exam is stable FISH studies revealed 93% of nuclei positive for homozygous 13q deletion and 81% of nuclei positive for three IGH signals.  CCND1, ATM, chromosome 12, and TP53 were normal. She was on ibrutinib from 04/24/2019 - 05/30/2019.                     She developed blood-streaked sputum (slight) then significant hematuria.                     She declined further ibrutinib.         She began acalabrutinib on 03/14/2020.          She is tolerating treatment well.  WBC has increased as expected on acalabrutinib.   She has no symptoms of leukostasis.  Continue to monitor closely. 3.   Health maintenance Patient is on the low-dose chest CT program.                     Chest CT  without contrast on 01/09/2020 revealed interval re-expansion of the left lower lobe.                                   There was a stable 3 mm left lower lobe pulmonary nodule.                      Next chest CT on  01/08/2021. Bilateral mammogram was due on 01/15/2020. 4.   RTC in 1 weeks for labs (CBC with diff, CMP, uric acid). 5.   RTC in 2 weeks for MD assessment and labs (CBC with diff, BMP, uric acid).  I discussed the assessment and treatment plan with the patient.  The patient was provided an opportunity to ask questions and all were answered.  The patient agreed with the plan and demonstrated an understanding of the instructions.  The patient was advised to call back if the symptoms worsen or if the condition fails to improve as anticipated.   Lequita Asal, MD, PhD        03/31/2020, 5:31 PM

## 2020-03-31 ENCOUNTER — Other Ambulatory Visit: Payer: Self-pay

## 2020-03-31 ENCOUNTER — Inpatient Hospital Stay: Payer: Medicare HMO | Attending: Hematology and Oncology

## 2020-03-31 ENCOUNTER — Inpatient Hospital Stay (HOSPITAL_BASED_OUTPATIENT_CLINIC_OR_DEPARTMENT_OTHER): Payer: Medicare HMO | Admitting: Hematology and Oncology

## 2020-03-31 ENCOUNTER — Encounter: Payer: Self-pay | Admitting: Hematology and Oncology

## 2020-03-31 VITALS — BP 121/62 | HR 89 | Temp 97.0°F | Resp 18 | Wt 150.9 lb

## 2020-03-31 DIAGNOSIS — Z79899 Other long term (current) drug therapy: Secondary | ICD-10-CM | POA: Insufficient documentation

## 2020-03-31 DIAGNOSIS — Z90710 Acquired absence of both cervix and uterus: Secondary | ICD-10-CM | POA: Diagnosis not present

## 2020-03-31 DIAGNOSIS — C9110 Chronic lymphocytic leukemia of B-cell type not having achieved remission: Secondary | ICD-10-CM

## 2020-03-31 DIAGNOSIS — F17210 Nicotine dependence, cigarettes, uncomplicated: Secondary | ICD-10-CM | POA: Diagnosis not present

## 2020-03-31 DIAGNOSIS — Z803 Family history of malignant neoplasm of breast: Secondary | ICD-10-CM | POA: Insufficient documentation

## 2020-03-31 DIAGNOSIS — R69 Illness, unspecified: Secondary | ICD-10-CM | POA: Diagnosis not present

## 2020-03-31 LAB — CBC WITH DIFFERENTIAL/PLATELET
Abs Immature Granulocytes: 0 10*3/uL (ref 0.00–0.07)
Basophils Absolute: 0 10*3/uL (ref 0.0–0.1)
Basophils Relative: 0 %
Eosinophils Absolute: 0 10*3/uL (ref 0.0–0.5)
Eosinophils Relative: 0 %
HCT: 43 % (ref 36.0–46.0)
Hemoglobin: 11.6 g/dL — ABNORMAL LOW (ref 12.0–15.0)
Lymphocytes Relative: 96 %
Lymphs Abs: 313.8 10*3/uL — ABNORMAL HIGH (ref 0.7–4.0)
MCH: 28.6 pg (ref 26.0–34.0)
MCHC: 27 g/dL — ABNORMAL LOW (ref 30.0–36.0)
MCV: 105.9 fL — ABNORMAL HIGH (ref 80.0–100.0)
Monocytes Absolute: 3.3 10*3/uL — ABNORMAL HIGH (ref 0.1–1.0)
Monocytes Relative: 1 %
Neutro Abs: 9.8 10*3/uL — ABNORMAL HIGH (ref 1.7–7.7)
Neutrophils Relative %: 3 %
Platelets: 144 10*3/uL — ABNORMAL LOW (ref 150–400)
RBC Morphology: NONE SEEN
RBC: 4.06 MIL/uL (ref 3.87–5.11)
WBC Morphology: ABNORMAL
WBC: 326.9 10*3/uL (ref 4.0–10.5)
nRBC: 0 % (ref 0.0–0.2)

## 2020-03-31 LAB — BASIC METABOLIC PANEL
Anion gap: 12 (ref 5–15)
BUN: 10 mg/dL (ref 8–23)
CO2: 24 mmol/L (ref 22–32)
Calcium: 9.3 mg/dL (ref 8.9–10.3)
Chloride: 102 mmol/L (ref 98–111)
Creatinine, Ser: 0.83 mg/dL (ref 0.44–1.00)
GFR, Estimated: >60 mL/min (ref >60)
Glucose, Bld: 108 mg/dL — ABNORMAL HIGH (ref 70–99)
Potassium: 4.1 mmol/L (ref 3.5–5.1)
Sodium: 138 mmol/L (ref 135–145)

## 2020-03-31 LAB — URIC ACID: Uric Acid, Serum: 4.3 mg/dL (ref 2.5–7.1)

## 2020-04-02 ENCOUNTER — Telehealth: Payer: Self-pay | Admitting: Pharmacist

## 2020-04-02 MED FILL — CALQUENCE 100 MG CAPSULE: 100 | 30 days supply | Qty: 60 | Fill #1

## 2020-04-02 NOTE — Telephone Encounter (Signed)
Oral Chemotherapy Pharmacist Encounter   Received message from Dr. Mike Gip that Ms. Guilbert had some questions about DDI with Calquence.  Called Ms. Hick to review below DDIs based on her med list in Epic as of 04/02/20 (she reported no new medication started recently): -Calcium/vitamin D: Calcium carbonate may decrease the concentration of Calquence. Recommendation is to separate the two medication by at least 2 hours. -Paroxetine: May enhance the antiplatelet effect of Calquence. At this time her platelet count is not a concern, will continue to monitor. No adjustment needed now.  Ms. Hellberg stated her understanding of the above information.   During this call I also set-up her refill with [facility].   [name], PharmD, BCPS, BCOP, CPP Hematology/Oncology Clinical Pharmacist [facility]/HP/AP [facility] [phone]  04/02/2020 9:32 AM

## 2020-04-07 ENCOUNTER — Inpatient Hospital Stay: Payer: Medicare HMO

## 2020-04-07 ENCOUNTER — Other Ambulatory Visit: Payer: Self-pay

## 2020-04-07 ENCOUNTER — Telehealth: Payer: Self-pay

## 2020-04-07 DIAGNOSIS — Z90710 Acquired absence of both cervix and uterus: Secondary | ICD-10-CM | POA: Diagnosis not present

## 2020-04-07 DIAGNOSIS — C9110 Chronic lymphocytic leukemia of B-cell type not having achieved remission: Secondary | ICD-10-CM | POA: Diagnosis not present

## 2020-04-07 DIAGNOSIS — Z79899 Other long term (current) drug therapy: Secondary | ICD-10-CM | POA: Diagnosis not present

## 2020-04-07 DIAGNOSIS — R69 Illness, unspecified: Secondary | ICD-10-CM | POA: Diagnosis not present

## 2020-04-07 DIAGNOSIS — Z803 Family history of malignant neoplasm of breast: Secondary | ICD-10-CM | POA: Diagnosis not present

## 2020-04-07 LAB — COMPREHENSIVE METABOLIC PANEL
ALT: 12 U/L (ref 0–44)
AST: 19 U/L (ref 15–41)
Albumin: 4.6 g/dL (ref 3.5–5.0)
Alkaline Phosphatase: 92 U/L (ref 38–126)
Anion gap: 9 (ref 5–15)
BUN: 9 mg/dL (ref 8–23)
CO2: 26 mmol/L (ref 22–32)
Calcium: 9.2 mg/dL (ref 8.9–10.3)
Chloride: 102 mmol/L (ref 98–111)
Creatinine, Ser: 0.85 mg/dL (ref 0.44–1.00)
GFR, Estimated: >60 mL/min (ref >60)
Glucose, Bld: 103 mg/dL — ABNORMAL HIGH (ref 70–99)
Potassium: 4.5 mmol/L (ref 3.5–5.1)
Sodium: 137 mmol/L (ref 135–145)
Total Bilirubin: 0.9 mg/dL (ref 0.3–1.2)
Total Protein: 6.8 g/dL (ref 6.5–8.1)

## 2020-04-07 LAB — CBC WITH DIFFERENTIAL/PLATELET
Abs Immature Granulocytes: 0.45 10*3/uL — ABNORMAL HIGH (ref 0.00–0.07)
Basophils Absolute: 0.1 10*3/uL (ref 0.0–0.1)
Basophils Relative: 0 %
Eosinophils Absolute: 0.2 10*3/uL (ref 0.0–0.5)
Eosinophils Relative: 0 %
HCT: 42.3 % (ref 36.0–46.0)
Hemoglobin: 11.1 g/dL — ABNORMAL LOW (ref 12.0–15.0)
Immature Granulocytes: 0 %
Lymphocytes Relative: 98 %
Lymphs Abs: 318.6 10*3/uL — ABNORMAL HIGH (ref 0.7–4.0)
MCH: 28 pg (ref 26.0–34.0)
MCHC: 26.2 g/dL — ABNORMAL LOW (ref 30.0–36.0)
MCV: 106.8 fL — ABNORMAL HIGH (ref 80.0–100.0)
Monocytes Absolute: 2.5 10*3/uL — ABNORMAL HIGH (ref 0.1–1.0)
Monocytes Relative: 1 %
Neutro Abs: 3.7 10*3/uL (ref 1.7–7.7)
Neutrophils Relative %: 1 %
Platelets: 154 10*3/uL (ref 150–400)
RBC: 3.96 MIL/uL (ref 3.87–5.11)
Smear Review: NORMAL
WBC Morphology: ABNORMAL
WBC: 325.6 10*3/uL (ref 4.0–10.5)
nRBC: 0 % (ref 0.0–0.2)

## 2020-04-07 LAB — URIC ACID: Uric Acid, Serum: 4.4 mg/dL (ref 2.5–7.1)

## 2020-04-10 NOTE — Progress Notes (Signed)
[facility]  [address], [address] [place], [place] [address] Phone: [phone]  Fax: [phone]  Clinic Day: 04/14/20   Referring physician: Venita Lick, NP   Chief Complaint: Katherine [name] is a 80 y.o. female with chronic lymphocytic leukemia (CLL) who is seen for 2 week assessment on acalabrutinib (Calquence).  HPI: The patient was last seen in the medical oncology clinic on 03/31/2020. At that time, she denied any complaints.   Exam was stable. Hematocrit was 43.0, hemoglobin 11.6, platelets 144,000, WBC 326,900. BMP was normal. Uric was 4.3.  Labs on 04/07/2020 revealed a hematocrit of 42.3, hemoglobin 11.1, platelets 154,000, WBC 325,600. CMP was normal. Uric acid was 4.4.  During the interim, she has been fine. Her back pain is stable. She has back pain when she is active but feels better after she sits down. Her energy fluctuates which is normal for her. She reports occasional diarrhea but it is manageable. She has a chronic cough. She denies shortness of breath, nausea, and vomiting. Her sweats have resolved.    Past Medical History:  Diagnosis Date  . Allergy   . Anxiety   . Depression   . GERD (gastroesophageal reflux disease)   . Hemorrhoids   . Hypertension   . Leukemia, lymphoid ([place])    CLL  . Lobar pneumonia ([place])   . Osteopenia   . Personal history of chemotherapy     Past Surgical History:  Procedure Laterality Date  . ABDOMINAL HYSTERECTOMY    . APPENDECTOMY    . BREAST BIOPSY Left    bx x 3-neg  . COLON SURGERY     sigmoid resection  . COLONOSCOPY     2007, 2012  . COLONOSCOPY WITH PROPOFOL N/A 09/17/2015   Procedure: COLONOSCOPY WITH PROPOFOL;  Surgeon: Robert Bellow, MD;  Location: [facility] ENDOSCOPY;  Service: Endoscopy;  Laterality: N/A;  . OOPHORECTOMY    . SPINE SURGERY     L4-5    Family History  Problem Relation Age of Onset  . Osteoporosis Mother   . Hypertension Father   . Heart attack Father    . Stroke Maternal Grandfather   . Breast cancer Sister 42   Social History:  reports that she has been smoking cigarettes. She has a 35.00 pack-year smoking history. She has never used smokeless tobacco. She reports that she does not drink alcohol and does not use drugs. Currently smokes 5 cigarettes daily, but is working toward quitting. She previously smoked a pack a day for a "longtime".She lives in [place], [place].The patient is alone today.   Allergies:  Allergies  Allergen Reactions  . Augmentin [Amoxicillin-Pot Clavulanate] Swelling    tongue  . Biaxin [Clarithromycin] Swelling and Rash    tongue   Current Medications: Current Outpatient Medications  Medication Sig Dispense Refill  . acalabrutinib (CALQUENCE) 100 MG capsule Take 1 capsule (100 mg total) by mouth 2 (two) times daily. 60 capsule 1  . atorvastatin (LIPITOR) 10 MG tablet Take 1 tablet (10 mg total) by mouth daily. 90 tablet 4  . benazepril (LOTENSIN) 40 MG tablet Take 1 tablet (40 mg total) by mouth daily. 90 tablet 4  . diphenhydrAMINE (BENADRYL) 25 MG tablet Take 25 mg by mouth at bedtime as needed.    Marland Kitchen LORazepam (ATIVAN) 1 MG tablet Take 0.5 tablets (0.5 mg total) by mouth 2 (two) times daily as needed for anxiety. 30 tablet 4  . Multiple Vitamins-Minerals (MULTIVITAMIN WITH MINERALS) tablet Take 1 tablet  by mouth daily.    Marland Kitchen PARoxetine (PAXIL) 30 MG tablet Take 1 tablet (30 mg total) by mouth daily. 90 tablet 4  . Triamcinolone Acetonide (NASACORT AQ NA) Place 2 Doses into the nose daily as needed.     Marland Kitchen albuterol (VENTOLIN HFA) 108 (90 Base) MCG/ACT inhaler TAKE 2 PUFFS BY MOUTH EVERY 6 HOURS AS NEEDED FOR WHEEZE OR SHORTNESS OF BREATH 108 g 9   No current facility-administered medications for this visit.    Review of Systems  Constitutional: Negative for chills, diaphoresis, fever, malaise/fatigue (energy fluctuates) and weight loss (up 2 lbs).       Feels "fine."  HENT: Negative for congestion, ear  discharge, ear pain, hearing loss, nosebleeds, sinus pain, sore throat and tinnitus.   Eyes: Negative for blurred vision.  Respiratory: Negative for cough, hemoptysis, sputum production and shortness of breath.   Cardiovascular: Negative for chest pain, palpitations and leg swelling.  Gastrointestinal: Positive for diarrhea (occasional, manageable). Negative for abdominal pain, blood in stool, constipation, heartburn, melena, nausea and vomiting.  Genitourinary: Negative for dysuria, frequency, hematuria and urgency.  Musculoskeletal: Positive for back pain (when active). Negative for joint pain, myalgias and neck pain.  Skin: Negative for itching and rash.  Neurological: Negative for dizziness, tingling, sensory change, weakness and headaches.  Endo/Heme/Allergies: Negative for environmental allergies. Does not bruise/bleed easily.  Psychiatric/Behavioral: Negative for depression and memory loss. The patient is not nervous/anxious and does not have insomnia.   All other systems reviewed and are negative.  Performance status (ECOG): 1  Vitals Blood pressure (!) 102/59, pulse 73, temperature (!) 97.5 F (36.4 C), temperature source Tympanic, resp. rate 20, height 5' 5.71" (1.669 m), weight 152 lb (68.9 kg).  Physical Exam Vitals and nursing note reviewed.  Constitutional:      General: She is not in acute distress.    Appearance: She is not diaphoretic.  HENT:     Head: Normocephalic and atraumatic.     Comments: Short gray hair.    Mouth/Throat:     Mouth: Mucous membranes are moist.     Pharynx: Oropharynx is clear.  Eyes:     General: No scleral icterus.    Extraocular Movements: Extraocular movements intact.     Conjunctiva/sclera: Conjunctivae normal.     Pupils: Pupils are equal, round, and reactive to light.     Comments: Glasses. Brown eyes.  Cardiovascular:     Rate and Rhythm: Normal rate and regular rhythm.     Heart sounds: Normal heart sounds. No murmur  heard.   Pulmonary:     Effort: Pulmonary effort is normal. No respiratory distress.     Breath sounds: Normal breath sounds. No wheezing or rales.  Chest:     Chest wall: No tenderness.  Breasts:     Right: No axillary adenopathy or supraclavicular adenopathy.     Left: No axillary adenopathy or supraclavicular adenopathy.    Abdominal:     General: Bowel sounds are normal. There is no distension.     Palpations: Abdomen is soft. There is no splenomegaly (spleen tip underneath rib) or mass.     Tenderness: There is no abdominal tenderness. There is no guarding or rebound.  Musculoskeletal:        General: No swelling or tenderness. Normal range of motion.     Cervical back: Normal range of motion and neck supple.     Right lower leg: Edema present.     Left lower leg: Edema present.  Lymphadenopathy:     Head:     Right side of head: No preauricular, posterior auricular or occipital adenopathy.     Left side of head: No preauricular, posterior auricular or occipital adenopathy.     Cervical: No cervical adenopathy.     Upper Body:     Right upper body: No supraclavicular or axillary adenopathy.     Left upper body: No supraclavicular or axillary adenopathy.     Lower Body: No right inguinal adenopathy. No left inguinal adenopathy.  Skin:    General: Skin is warm and dry.  Neurological:     Mental Status: She is alert and oriented to person, place, and time.  Psychiatric:        Behavior: Behavior normal.        Thought Content: Thought content normal.        Judgment: Judgment normal.    Appointment on 04/14/2020  Component Date Value Ref Range Status  . Uric Acid, Serum 04/14/2020 4.5  2.5 - 7.1 mg/dL Final   Performed at [facility], [address], [place], [place] [address]  . Sodium 04/14/2020 138  135 - 145 mmol/L Final  . Potassium 04/14/2020 4.5  3.5 - 5.1 mmol/L Final  . Chloride 04/14/2020 103  98 - 111 mmol/L Final  . CO2 04/14/2020 23  22 -  32 mmol/L Final  . Glucose, Bld 04/14/2020 100* 70 - 99 mg/dL Final   Glucose reference range applies only to samples taken after fasting for at least 8 hours.  . BUN 04/14/2020 9  8 - 23 mg/dL Final  . Creatinine, Ser 04/14/2020 0.87  0.44 - 1.00 mg/dL Final  . Calcium 04/14/2020 9.2  8.9 - 10.3 mg/dL Final  . GFR, Estimated 04/14/2020 >60  >60 mL/min Final   Comment: (NOTE) Calculated using the CKD-EPI Creatinine Equation (2021)   . Anion gap 04/14/2020 12  5 - 15 Final   Performed at [facility], [address], [place], [place] [address]  . WBC 04/14/2020 316.8* 4.0 - 10.5 K/uL Final   This critical result has verified and been called to [facility] SAUNDERS(RN) by Gennaro Africa on 02 21 2022 at 1112, and has been read back.   Marland Kitchen RBC 04/14/2020 3.81* 3.87 - 5.11 MIL/uL Final  . Hemoglobin 04/14/2020 10.9* 12.0 - 15.0 g/dL Final  . HCT 04/14/2020 40.6  36.0 - 46.0 % Final  . MCV 04/14/2020 106.6* 80.0 - 100.0 fL Final  . MCH 04/14/2020 28.6  26.0 - 34.0 pg Final  . MCHC 04/14/2020 26.8* 30.0 - 36.0 g/dL Final  . RDW 04/14/2020 Not Measured  11.5 - 15.5 % Final  . Platelets 04/14/2020 148* 150 - 400 K/uL Final  . nRBC 04/14/2020 0.0  0.0 - 0.2 % Final   Performed at [facility], [address], [place], [place] [address]  . Neutrophils Relative % 04/14/2020 PENDING  % Incomplete  . Neutro Abs 04/14/2020 PENDING  1.7 - 7.7 K/uL Incomplete  . Band Neutrophils 04/14/2020 PENDING  % Incomplete  . Lymphocytes Relative 04/14/2020 PENDING  % Incomplete  . Lymphs Abs 04/14/2020 PENDING  0.7 - 4.0 K/uL Incomplete  . Monocytes Relative 04/14/2020 PENDING  % Incomplete  . Monocytes Absolute 04/14/2020 PENDING  0.1 - 1.0 K/uL Incomplete  . Eosinophils Relative 04/14/2020 PENDING  % Incomplete  . Eosinophils Absolute 04/14/2020 PENDING  0.0 - 0.5 K/uL Incomplete  . Basophils Relative 04/14/2020 PENDING  % Incomplete  .  Basophils Absolute 04/14/2020 PENDING  0.0 -  0.1 K/uL Incomplete  . WBC Morphology 04/14/2020 PENDING   Incomplete  . RBC Morphology 04/14/2020 PENDING   Incomplete  . Smear Review 04/14/2020 PENDING   Incomplete  . Other 04/14/2020 PENDING  % Incomplete  . nRBC 04/14/2020 PENDING  0 /100 WBC Incomplete  . Metamyelocytes Relative 04/14/2020 PENDING  % Incomplete  . Myelocytes 04/14/2020 PENDING  % Incomplete  . Promyelocytes Relative 04/14/2020 PENDING  % Incomplete  . Blasts 04/14/2020 PENDING  % Incomplete  . Immature Granulocytes 04/14/2020 PENDING  % Incomplete  . Abs Immature Granulocytes 04/14/2020 PENDING  0.00 - 0.07 K/uL Incomplete    Assessment:  Katherine [name] is a 80 y.o. female with chronic lymphocytic leukemia(CLL). WBC has ranged between 22,000 - 101,7000 since 05/2011.  She has received Rituxanx 2 four week cycles (06/27/2015 and 05/27/2017). Hepatitis B surface antigen and hepatitis B surface antibody were negative on 07/04/2015.  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  CCND1, ATM, chromosome 12, and TP53 were normal.   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  There was a CD5 and CD23 positive monoclonal B cell population with lambda light chain restriction, negative for FMC7 and CD38, representing  88% of leukocytes, >5,000/uL. There was no loss of, or aberrant expression of, the pan T cell  antigens to  suggest a neoplastic T cell process. CD4:CD8 ratio 2.0  No circulating blasts were detected. There was no immunophenotypic evidence of abnormal myeloid maturation.  IGH/BCL2 by FISH revealed 86.5% of nuclei positive for 3 IgH signals.  There were 2BCL2 signals. The IGH/BCL2 fusion signals associated with follicular lymphoma, and to a lesser extent diffuse large cell  lymphoma, were not observed. The presence of 3 IGH  signals suggests an IGH rearrangement with a gene  other than BCL2.  She received ibrutinib from 04/24/2019 - 05/30/2019.  She stopped ibrutinib secondary to hematuria  She began acalabrutinib on 03/14/2020.  WBC has been followed: 107,200 on 04/24/2019, 206,800 on 04/30/2019, 184,700 on 05/07/2019, 209,500 on 05/15/2019, 237,700 on 05/21/2019, 256,400 on 06/01/2019, 220,400 on 06/04/2019, 81,400 on 06/19/2019, 69,500 on 07/18/2019, 80,500 on 08/15/2019, 95,500 on 09/12/2019, 107,500 on 10/11/2019, 83,10 on 11/08/2019, 104,700 on 12/06/2019, 116,200 on 02/06/2020, 144,300 on 03/13/2020, and 326,900 on 03/31/2020.  She developed hematuria. CT hematuria work up on 06/01/2019 was negative.  There was no retroperitoneal or periportal lymphadenopathy. Cystoscopy on 06/13/2019 was normal.  Her last episode of gross hematuria was on 06/02/2019. Urinalysis on 06/07/2019 showed leukocytes 1+ and trace amounts of ketones and RBC. Urine culture revealed mixed urogenital flora.   She has a 35 pack year smoking history. She is in the low dose chest CT program. Low dose chest CTon 02/02/2018 revealed a new endobronchial lesionin the segmental bronchus to the medial segment of the right middle lobe. This may simply reflect an area of retained secretions, however, the possibility of an endobronchial neoplasm should be considered. Lung-RADS 4AS, suspicious.Low dose chest CTon 05/13/2020revealed Lung-RADS 2, benign appearance or behavior. Prior right middle lobe endobronchial lesion/mucous plugging wasno longer visualized. New mucous plugging in the right lower lobe.  Low dose chest CT on 10/11/2019 revealed complete atelectasis in the left lower lobe.  There was no pathologically enlarged mediastinal or hilar lymph nodes.  There was no axillary adenopathy.  There was mild diffuse bronchial wall thickening with mild  centrilobular and paraseptal emphysema; imaging findings suggestive of underlying COPD.   Low dose chest CT on 10/11/2019 revealed Lung-RADS 0S,  incomplete. Study was limited by complete atelectasis in the left lower lobe.  There was no pathologically enlarged mediastinal or hilar lymph nodes.  There was no axillary adenopathy.  There was aortic atherosclerosis, in addition to 3 vessel coronary artery disease. There was mild diffuse bronchial wall thickening with mild centrilobular and paraseptal emphysema; imaging findings suggestive of underlying COPD. Chest CT without contrast on 01/09/2020 revealed interval re-expansion of the left lower lobe.  There was a stable 3 mm left lower lobe pulmonary nodule, benign based on long-term stability. If the patient continues to meet screening eligibility criteria, repeat low-dose lung cancer screening CT in 1 year could be performed.  There was splenomegaly (partially visualized).  There was aortic atherosclerosis and emphysema.  She received her first dose of the [place] COVID-19 vaccine on 05/11/2019.  Symptomatically,  she feels fine. Sweats have resolved.  Exam reveals no adenopathy or hepatosplenomegaly.  WBC is 316,800.  Plan: 1.   Labs today: CBC with diff, BMP, uric acid 2. Chronic lymphocytic leukemia (CLL) Clinically,  she denies any B symptoms.  Sweats have resolved.. She has Rai stage II. 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  CCND1, ATM, chromosome 12, and TP53 were normal. She began ibrutinib on 04/24/2019 (discontinued on 05/30/2019).                     She developed blood-streaked sputum (slight) then significant hematuria.                     She declined further ibrutinib.            She began acalabrutinib on 03/14/2020.   She is tolerating treatment well.   WBC has increased as expected.   No evidence of leukostasis.     Continue acalabrutinib. 3. Health maintenance Patient is on the low-dose chest CT  program.                     Chest CT  without contrast on 01/09/2020 revealed interval re-expansion of the left lower lobe.                                   There was a stable 3 mm left lower lobe pulmonary nodule.                      Next imaging on 01/08/2021. Bilateral mammogram was due on 01/15/2020. 4.   RTC in 4 weeks for MD assess and labs (CBC with diff, CMP, uric acid).  I discussed the assessment and treatment plan with the patient.  The patient was provided an opportunity to ask questions and all were answered.  The patient agreed with the plan and demonstrated an understanding of the instructions.  The patient was advised to call back if the symptoms worsen or if the condition fails to improve as anticipated.   Lequita Asal, MD, PhD    04/14/2020 , 11:28 AM   I, De Burrs, attest that this documentation has been prepared under the direction and in the presence of Franke Menter, Drue Second, MD.  I, [place] Mike Gip, MD, have reviewed the above documentation for accuracy and completeness,  and I agree with the above.

## 2020-04-14 ENCOUNTER — Encounter: Payer: Self-pay | Admitting: Hematology and Oncology

## 2020-04-14 ENCOUNTER — Other Ambulatory Visit: Payer: Self-pay

## 2020-04-14 ENCOUNTER — Inpatient Hospital Stay: Payer: Medicare HMO

## 2020-04-14 ENCOUNTER — Inpatient Hospital Stay (HOSPITAL_BASED_OUTPATIENT_CLINIC_OR_DEPARTMENT_OTHER): Payer: Medicare HMO | Admitting: Hematology and Oncology

## 2020-04-14 VITALS — BP 102/59 | HR 73 | Temp 97.5°F | Resp 20 | Ht 65.71 in | Wt 152.0 lb

## 2020-04-14 DIAGNOSIS — C9110 Chronic lymphocytic leukemia of B-cell type not having achieved remission: Secondary | ICD-10-CM

## 2020-04-14 DIAGNOSIS — Z79899 Other long term (current) drug therapy: Secondary | ICD-10-CM | POA: Diagnosis not present

## 2020-04-14 DIAGNOSIS — Z803 Family history of malignant neoplasm of breast: Secondary | ICD-10-CM | POA: Diagnosis not present

## 2020-04-14 DIAGNOSIS — Z90710 Acquired absence of both cervix and uterus: Secondary | ICD-10-CM | POA: Diagnosis not present

## 2020-04-14 DIAGNOSIS — R69 Illness, unspecified: Secondary | ICD-10-CM | POA: Diagnosis not present

## 2020-04-14 LAB — CBC WITH DIFFERENTIAL/PLATELET
Abs Immature Granulocytes: 0.52 10*3/uL — ABNORMAL HIGH (ref 0.00–0.07)
Basophils Absolute: 0.1 10*3/uL (ref 0.0–0.1)
Basophils Relative: 0 %
Eosinophils Absolute: 0.2 10*3/uL (ref 0.0–0.5)
Eosinophils Relative: 0 %
HCT: 40.6 % (ref 36.0–46.0)
Hemoglobin: 10.9 g/dL — ABNORMAL LOW (ref 12.0–15.0)
Immature Granulocytes: 0 %
Lymphocytes Relative: 98 %
Lymphs Abs: 310 10*3/uL — ABNORMAL HIGH (ref 0.7–4.0)
MCH: 28.6 pg (ref 26.0–34.0)
MCHC: 26.8 g/dL — ABNORMAL LOW (ref 30.0–36.0)
MCV: 106.6 fL — ABNORMAL HIGH (ref 80.0–100.0)
Monocytes Absolute: 2 10*3/uL — ABNORMAL HIGH (ref 0.1–1.0)
Monocytes Relative: 1 %
Neutro Abs: 4.2 10*3/uL (ref 1.7–7.7)
Neutrophils Relative %: 1 %
Platelets: 148 10*3/uL — ABNORMAL LOW (ref 150–400)
RBC Morphology: NONE SEEN
RBC: 3.81 MIL/uL — ABNORMAL LOW (ref 3.87–5.11)
WBC: 316.8 10*3/uL (ref 4.0–10.5)
nRBC: 0 % (ref 0.0–0.2)

## 2020-04-14 LAB — BASIC METABOLIC PANEL
Anion gap: 12 (ref 5–15)
BUN: 9 mg/dL (ref 8–23)
CO2: 23 mmol/L (ref 22–32)
Calcium: 9.2 mg/dL (ref 8.9–10.3)
Chloride: 103 mmol/L (ref 98–111)
Creatinine, Ser: 0.87 mg/dL (ref 0.44–1.00)
GFR, Estimated: >60 mL/min (ref >60)
Glucose, Bld: 100 mg/dL — ABNORMAL HIGH (ref 70–99)
Potassium: 4.5 mmol/L (ref 3.5–5.1)
Sodium: 138 mmol/L (ref 135–145)

## 2020-04-14 LAB — URIC ACID: Uric Acid, Serum: 4.5 mg/dL (ref 2.5–7.1)

## 2020-04-14 NOTE — Progress Notes (Signed)
Patient is tolerating Calquence well. She has not missed any dosages of Calquence  Critical results for wbc - called to Brigid Re, RN at 112 wbc. 316.8. Dr. Mike Gip aware of Critical value- read back process with MD. 1130 am

## 2020-04-20 ENCOUNTER — Other Ambulatory Visit: Payer: Self-pay

## 2020-04-20 ENCOUNTER — Emergency Department: Payer: Medicare HMO

## 2020-04-20 ENCOUNTER — Emergency Department
Admission: EM | Admit: 2020-04-20 | Discharge: 2020-04-21 | Disposition: A | Discharge status: Home / Self Care | Payer: Medicare HMO | Attending: Emergency Medicine | Admitting: Emergency Medicine

## 2020-04-20 ENCOUNTER — Encounter: Payer: Self-pay | Admitting: Radiology

## 2020-04-20 DIAGNOSIS — R778 Other specified abnormalities of plasma proteins: Secondary | ICD-10-CM

## 2020-04-20 DIAGNOSIS — Z79899 Other long term (current) drug therapy: Secondary | ICD-10-CM | POA: Insufficient documentation

## 2020-04-20 DIAGNOSIS — K219 Gastro-esophageal reflux disease without esophagitis: Secondary | ICD-10-CM | POA: Diagnosis not present

## 2020-04-20 DIAGNOSIS — F17210 Nicotine dependence, cigarettes, uncomplicated: Secondary | ICD-10-CM | POA: Diagnosis not present

## 2020-04-20 DIAGNOSIS — I10 Essential (primary) hypertension: Secondary | ICD-10-CM | POA: Insufficient documentation

## 2020-04-20 DIAGNOSIS — Z20822 Contact with and (suspected) exposure to covid-19: Secondary | ICD-10-CM | POA: Diagnosis not present

## 2020-04-20 DIAGNOSIS — R1032 Left lower quadrant pain: Secondary | ICD-10-CM | POA: Insufficient documentation

## 2020-04-20 DIAGNOSIS — R5383 Other fatigue: Secondary | ICD-10-CM | POA: Diagnosis not present

## 2020-04-20 DIAGNOSIS — R69 Illness, unspecified: Secondary | ICD-10-CM | POA: Diagnosis not present

## 2020-04-20 DIAGNOSIS — R109 Unspecified abdominal pain: Secondary | ICD-10-CM | POA: Diagnosis not present

## 2020-04-20 DIAGNOSIS — R531 Weakness: Secondary | ICD-10-CM

## 2020-04-20 LAB — COMPREHENSIVE METABOLIC PANEL
ALT: 11 U/L (ref 0–44)
AST: 18 U/L (ref 15–41)
Albumin: 4.6 g/dL (ref 3.5–5.0)
Alkaline Phosphatase: 86 U/L (ref 38–126)
Anion gap: 9 (ref 5–15)
BUN: 11 mg/dL (ref 8–23)
CO2: 23 mmol/L (ref 22–32)
Calcium: 9.4 mg/dL (ref 8.9–10.3)
Chloride: 107 mmol/L (ref 98–111)
Creatinine, Ser: 0.85 mg/dL (ref 0.44–1.00)
GFR, Estimated: >60 mL/min (ref >60)
Glucose, Bld: 143 mg/dL — ABNORMAL HIGH (ref 70–99)
Potassium: 4.9 mmol/L (ref 3.5–5.1)
Sodium: 139 mmol/L (ref 135–145)
Total Bilirubin: 0.9 mg/dL (ref 0.3–1.2)
Total Protein: 6.6 g/dL (ref 6.5–8.1)

## 2020-04-20 LAB — CBC
HCT: 42.1 % (ref 36.0–46.0)
Hemoglobin: 11.9 g/dL — ABNORMAL LOW (ref 12.0–15.0)
MCH: 30.4 pg (ref 26.0–34.0)
MCHC: 28.3 g/dL — ABNORMAL LOW (ref 30.0–36.0)
MCV: 107.7 fL — ABNORMAL HIGH (ref 80.0–100.0)
Platelets: 145 10*3/uL — ABNORMAL LOW (ref 150–400)
RBC: 3.91 MIL/uL (ref 3.87–5.11)
WBC: 322.3 10*3/uL (ref 4.0–10.5)
nRBC: 0 % (ref 0.0–0.2)

## 2020-04-20 LAB — URINALYSIS, COMPLETE (UACMP) WITH MICROSCOPIC
Bacteria, UA: NONE SEEN
Bilirubin Urine: NEGATIVE
Glucose, UA: NEGATIVE mg/dL
Hgb urine dipstick: NEGATIVE
Ketones, ur: 5 mg/dL — AB
Nitrite: NEGATIVE
Protein, ur: 30 mg/dL — AB
Specific Gravity, Urine: 1.02 (ref 1.005–1.030)
pH: 5 (ref 5.0–8.0)

## 2020-04-20 LAB — RESP PANEL BY RT-PCR (FLU A&B, COVID) ARPGX2
Influenza A by PCR: NEGATIVE
Influenza B by PCR: NEGATIVE
SARS Coronavirus 2 by RT PCR: NEGATIVE

## 2020-04-20 LAB — TROPONIN I (HIGH SENSITIVITY): Troponin I (High Sensitivity): 19 ng/L — ABNORMAL HIGH (ref <18)

## 2020-04-20 MED ORDER — SODIUM CHLORIDE 0.9 % IV BOLUS
500.0000 mL | Freq: Once | INTRAVENOUS | Status: AC
  Administered 2020-04-20: 500 mL via INTRAVENOUS

## 2020-04-20 NOTE — ED Notes (Signed)
Patient transported to X-ray 

## 2020-04-20 NOTE — ED Triage Notes (Signed)
Pt complains of llq pain since 1530 today with emesis. Pt does not appear to be in acute distress. Pt denies diarrhea. Pt states she thinks she is dehydrated.

## 2020-04-20 NOTE — ED Provider Notes (Signed)
[facility] Emergency Department Provider Note  ____________________________________________   Event Date/Time   First MD Initiated Contact with Patient 04/20/20 2103     (approximate)  I have reviewed the triage vital signs and the nursing notes.   HISTORY  Chief Complaint Abdominal Pain (Abdominal pain since 1530, tylenol @ 1800 w/o relief. Pt has PMH of leukemia. )    HPI Katherine [name] is a 80 y.o. female with leukemia who comes in for abdominal pain.  Patient last took Tylenol at 6 PM.  Patient states that around 3:30 PM today she started feeling really weak and had some left lower quadrant pain.  The pain was severe, not better with Tylenol, nothing made it worse.  Constant.  However now she states the pain is now gone she just feels very weak and tired.  She denies having any falls.  Has had a COVID vaccines.  Denies any urinary symptoms.          Past Medical History:  Diagnosis Date  . Allergy   . Anxiety   . Depression   . GERD (gastroesophageal reflux disease)   . Hemorrhoids   . Hypertension   . Leukemia, lymphoid ([place])    CLL  . Lobar pneumonia ([place])   . Osteopenia   . Personal history of chemotherapy     Patient Active Problem List   Diagnosis Date Noted  . Long-term current use of benzodiazepine 02/05/2020  . Splenomegaly 02/02/2020  . Thrombocytopenia ([place]) 02/02/2020  . GERD (gastroesophageal reflux disease)   . Allergic rhinitis 07/18/2018  . Goals of care, counseling/discussion 07/05/2018  . Nicotine dependence, cigarettes, uncomplicated 62/22/9798  . Centrilobular emphysema ([place]) 02/05/2018  . Advanced care planning/counseling discussion 01/09/2018  . Hemorrhoids, internal 08/09/2017  . Anxiety 01/04/2017  . Hypercholesterolemia 01/08/2016  . Atherosclerosis of aorta ([place]) 01/07/2016  . History of colonic polyps 08/14/2015  . CLL (chronic lymphocytic leukemia) ([place]) 05/28/2015  . Senile purpura ([place])  01/06/2015  . Essential hypertension 01/06/2015  . Depression, recurrent ([place]) 01/06/2015    Past Surgical History:  Procedure Laterality Date  . ABDOMINAL HYSTERECTOMY    . APPENDECTOMY    . BREAST BIOPSY Left    bx x 3-neg  . COLON SURGERY     sigmoid resection  . COLONOSCOPY     2007, 2012  . COLONOSCOPY WITH PROPOFOL N/A 09/17/2015   Procedure: COLONOSCOPY WITH PROPOFOL;  Surgeon: Robert Bellow, MD;  Location: [facility] ENDOSCOPY;  Service: Endoscopy;  Laterality: N/A;  . OOPHORECTOMY    . SPINE SURGERY     L4-5    Prior to Admission medications   Medication Sig Start Date End Date Taking? Authorizing Provider  acalabrutinib (CALQUENCE) 100 MG capsule Take 1 capsule (100 mg total) by mouth 2 (two) times daily. 02/28/20   Lequita Asal, MD  albuterol (VENTOLIN HFA) 108 (90 Base) MCG/ACT inhaler TAKE 2 PUFFS BY MOUTH EVERY 6 HOURS AS NEEDED FOR WHEEZE OR SHORTNESS OF BREATH 09/07/19   Volney American, PA-C  atorvastatin (LIPITOR) 10 MG tablet Take 1 tablet (10 mg total) by mouth daily. 02/05/20   Cannady, Henrine Screws T, NP  benazepril (LOTENSIN) 40 MG tablet Take 1 tablet (40 mg total) by mouth daily. 02/05/20   Cannady, Henrine Screws T, NP  diphenhydrAMINE (BENADRYL) 25 MG tablet Take 25 mg by mouth at bedtime as needed.    [provider]  LORazepam (ATIVAN) 1 MG tablet Take 0.5 tablets (0.5 mg total) by mouth 2 (  two) times daily as needed for anxiety. 02/05/20   Marnee Guarneri T, NP  Multiple Vitamins-Minerals (MULTIVITAMIN WITH MINERALS) tablet Take 1 tablet by mouth daily.    [provider]  PARoxetine (PAXIL) 30 MG tablet Take 1 tablet (30 mg total) by mouth daily. 02/05/20   Cannady, Henrine Screws T, NP  Triamcinolone Acetonide (NASACORT AQ NA) Place 2 Doses into the nose daily as needed.     [provider]    Allergies Augmentin [amoxicillin-pot clavulanate] and Biaxin [clarithromycin]  Family History  Problem Relation Age of Onset  . Osteoporosis  Mother   . Hypertension Father   . Heart attack Father   . Stroke Maternal Grandfather   . Breast cancer Sister 43    Social History Social History   Tobacco Use  . Smoking status: Current Every Day Smoker    Packs/day: 1.00    Years: 35.00    Pack years: 35.00    Types: Cigarettes  . Smokeless tobacco: Never Used  . Tobacco comment: 5 cigarettes daily--01/22/2020  Vaping Use  . Vaping Use: Never used  Substance Use Topics  . Alcohol use: No    Alcohol/week: 0.0 standard drinks  . Drug use: No      Review of Systems Constitutional: No fever/chills, weakness Eyes: No visual changes. ENT: No sore throat. Cardiovascular: Denies chest pain. Respiratory: Denies shortness of breath. Gastrointestinal: Positive abdominal pain no nausea, no vomiting.  No diarrhea.  No constipation. Genitourinary: Negative for dysuria. Musculoskeletal: Negative for back pain. Skin: Negative for rash. Neurological: Negative for headaches, focal weakness or numbness. All other ROS negative ____________________________________________   PHYSICAL EXAM:  VITAL SIGNS: ED Triage Vitals  Enc Vitals Group     BP 04/20/20 1952 (!) 112/56     Pulse Rate 04/20/20 1952 64     Resp 04/20/20 1952 16     Temp 04/20/20 1952 98.1 F (36.7 C)     Temp Source 04/20/20 1952 Oral     SpO2 04/20/20 1947 94 %     Weight 04/20/20 1953 153 lb (69.4 kg)     Height 04/20/20 1953 5\' 5"  (1.651 m)     Head Circumference --      Peak Flow --      Pain Score 04/20/20 1953 0     Pain Loc --      Pain Edu? --      Excl. in [place]? --     Constitutional: Alert and oriented. Well appearing and in no acute distress. Eyes: Conjunctivae are normal. EOMI. Head: Atraumatic. Nose: No congestion/rhinnorhea. Mouth/Throat: Mucous membranes are moist.   Neck: No stridor. Trachea Midline. FROM Cardiovascular: Normal rate, regular rhythm. Grossly normal heart sounds.  Good peripheral circulation. Respiratory: Normal  respiratory effort.  No retractions. Lungs CTAB. Gastrointestinal: Soft and nontender. No distention. No abdominal bruits.  Musculoskeletal: No lower extremity tenderness nor edema.  No joint effusions. Neurologic:  Normal speech and language. No gross focal neurologic deficits are appreciated.  Skin:  Skin is warm, dry and intact. No rash noted. Psychiatric: Mood and affect are normal. Speech and behavior are normal. GU: Deferred   ____________________________________________   LABS (all labs ordered are listed, but only abnormal results are displayed)  Labs Reviewed  COMPREHENSIVE METABOLIC PANEL - Abnormal; Notable for the following components:      Result Value   Glucose, Bld 143 (*)    All other components within normal limits  CBC - Abnormal; Notable for the following components:  WBC 322.3 (*)    Hemoglobin 11.9 (*)    MCV 107.7 (*)    MCHC 28.3 (*)    Platelets 145 (*)    All other components within normal limits  URINALYSIS, COMPLETE (UACMP) WITH MICROSCOPIC - Abnormal; Notable for the following components:   Color, Urine AMBER (*)    APPearance HAZY (*)    Ketones, ur 5 (*)    Protein, ur 30 (*)    Leukocytes,Ua SMALL (*)    All other components within normal limits  TROPONIN I (HIGH SENSITIVITY) - Abnormal; Notable for the following components:   Troponin I (High Sensitivity) 19 (*)    All other components within normal limits  RESP PANEL BY RT-PCR (FLU A&B, COVID) ARPGX2  TROPONIN I (HIGH SENSITIVITY)   ____________________________________________   ED ECG REPORT I, Vanessa [place], the attending physician, personally viewed and interpreted this ECG.  Normal sinus rate 61, no ST elevation, T wave version aVL, normal intervals ____________________________________________  RADIOLOGY Robert Bellow, personally viewed and evaluated these images (plain radiographs) as part of my medical decision making, as well as reviewing the written report by the  radiologist.  ED MD interpretation: No pneumonia  Official radiology report(s): DG Chest 2 View  Result Date: 04/20/2020 CLINICAL DATA:  Fatigue fall EXAM: CHEST - 2 VIEW COMPARISON:  November 12, 2019 FINDINGS: The heart size and mediastinal contours are within normal limits. Aortic knob calcifications are seen. Both lungs are clear. The visualized skeletal structures are unremarkable. IMPRESSION: No active cardiopulmonary disease. Electronically Signed   By: Prudencio Pair M.D.   On: 04/20/2020 21:34    ____________________________________________   PROCEDURES  Procedure(s) performed (including Critical Care):  Procedures   ____________________________________________   INITIAL IMPRESSION / ASSESSMENT AND PLAN / ED COURSE  Katherine [name] was evaluated in Emergency Department on 04/20/2020 for the symptoms described in the history of present illness. She was evaluated in the context of the global COVID-19 pandemic, which necessitated consideration that the patient might be at risk for infection with the SARS-CoV-2 virus that causes COVID-19. Institutional protocols and algorithms that pertain to the evaluation of patients at risk for COVID-19 are in a state of rapid change based on information released by regulatory bodies including the CDC and federal and state organizations. These policies and algorithms were followed during the patient's care in the ED.    Patient is a 80 year old with known leukemia who comes in for generalized weakness.  Patient is on the chemotherapy pill for the past month.  She originally had abdominal pain but denies any currently.  Has a history of diverticulitis but denies having pain like this before.  Will get labs to evaluate for Electra abnormalities, AKI  Kidney function at baseline.  Hemoglobin at baseline.  White count is significantly elevated but similar to prior and patient is aware of this.  UA no signs of UTI  Covid negative  Troponin is  slightly elevated at 19  On repeat evaluation patient is feeling much better.  She denies any abdominal pain still.  She sitting up in bed states she feels ready to go home.  We discussed the repeat troponin and if reassuring patient will be discharged home.  Patient will follow up with her oncologist and if her symptoms return she will return to the ER     ____________________________________________   FINAL CLINICAL IMPRESSION(S) / ED DIAGNOSES   Final diagnoses:  Weakness      MEDICATIONS GIVEN DURING  THIS VISIT:  Medications  sodium chloride 0.9 % bolus 500 mL (0 mLs Intravenous Stopped 04/20/20 2206)     ED Discharge Orders    None       Note:  This document was prepared using Dragon voice recognition software and may include unintentional dictation errors.   Vanessa [place], MD 04/21/20 Benancio Deeds

## 2020-04-21 ENCOUNTER — Ambulatory Visit: Payer: Medicare HMO | Admitting: Nurse Practitioner

## 2020-04-21 ENCOUNTER — Other Ambulatory Visit: Payer: Self-pay

## 2020-04-21 ENCOUNTER — Telehealth: Payer: Self-pay | Admitting: Nurse Practitioner

## 2020-04-21 LAB — TROPONIN I (HIGH SENSITIVITY): Troponin I (High Sensitivity): 41 ng/L — ABNORMAL HIGH (ref <18)

## 2020-04-21 NOTE — Discharge Instructions (Addendum)
Your work-up was generally reassuring, although as we discussed, your troponin level was slightly elevated.  We talked about admission to the hospital versus going home and your preference is to go home which is reasonable, but please remember that you need to follow-up with your doctor at the next available opportunity and you should return immediately to the emergency department if you develop new or worsening symptoms.  Most importantly you are feeling much better after fluids and we recommend that you follow-up with the next available opportunity as an outpatient.

## 2020-04-21 NOTE — Telephone Encounter (Signed)
Called and left a message for patient to return my call.  

## 2020-04-21 NOTE — ED Provider Notes (Signed)
----------------------------------------- °  12:14 AM on 04/21/2020 -----------------------------------------  Assuming care from Dr. Jari Pigg.  In short, Katherine [name] is a 80 y.o. female with a chief complaint of abdominal pain.  Refer to the original H&P for additional details.  The current plan of care is to follow up repeat troponin.  Anticipate discharge; patient feels much better and wants to go home.   ----------------------------------------- 1:12 AM on 04/21/2020 -----------------------------------------  The patient says she feels much better and would like to go home.  Her repeat troponin has gone up from 19-41.  However the patient says she feels much better and very much does not want to stay in the hospital.  Her adult daughter is at bedside.  I talked with her about the elevated troponin and how it is most likely related to her illness in general, but I cannot rule out cardiac event.  I offered to admit her to the hospital for further evaluation and treatment but she does not want to stay and I think that is reasonable given that she is well-appearing, has not specifically had chest pain, and she is at low risk for this being an acute cardiac event.  She and her daughter understand that she needs to follow-up with the next available opportunity and that her troponin elevation could be a marker of a "heart attack".  They are comfortable with the plan and will return immediately if she develops new or worsening symptoms.   Hinda Kehr, MD 04/21/20 [phone]

## 2020-04-21 NOTE — Telephone Encounter (Signed)
Patient informed that she needed to return to the ER for evaluation

## 2020-04-21 NOTE — Telephone Encounter (Signed)
Please let patient know that she will unfortunately need to return to the ER.  With her troponin level trending up, she will need further monitoring and evaluation that we are not able to do in the office.

## 2020-04-28 ENCOUNTER — Other Ambulatory Visit: Payer: Self-pay | Admitting: Hematology and Oncology

## 2020-04-28 DIAGNOSIS — C9110 Chronic lymphocytic leukemia of B-cell type not having achieved remission: Secondary | ICD-10-CM

## 2020-05-08 ENCOUNTER — Other Ambulatory Visit: Payer: Self-pay | Admitting: Nurse Practitioner

## 2020-05-08 ENCOUNTER — Other Ambulatory Visit: Payer: Self-pay | Admitting: *Deleted

## 2020-05-08 ENCOUNTER — Telehealth: Payer: Self-pay | Admitting: *Deleted

## 2020-05-08 MED ORDER — DIPHENOXYLATE-ATROPINE 2.5-0.025 MG PO TABS: 1.0000 | ORAL_TABLET | Freq: Four times a day (QID) | ORAL | 0 refills | Status: DC | PRN

## 2020-05-08 NOTE — Telephone Encounter (Signed)
Spoke with patient and she reports 4-6 stools per day, she is taking the maximum amount of lomotil. RX for Lomotil has been sent.

## 2020-05-08 NOTE — Telephone Encounter (Signed)
Patient called stating that the diarrhea she has from taking the Calquence is NOT being controlled with the Imodium and would like something else ordered for it.

## 2020-05-11 ENCOUNTER — Other Ambulatory Visit: Payer: Self-pay | Admitting: *Deleted

## 2020-05-11 DIAGNOSIS — C9110 Chronic lymphocytic leukemia of B-cell type not having achieved remission: Secondary | ICD-10-CM

## 2020-05-12 NOTE — Progress Notes (Signed)
[facility]  [address], [address] [place], [place] [address] Phone: [phone]  Fax: [phone]  Clinic Day: 05/13/20   Referring physician: Venita Lick, NP   Chief Complaint: Katherine [name] is a 80 y.o. female with chronic lymphocytic leukemia (CLL) who is seen for 4 week assessment on acalabrutinib (Calquence).  HPI: The patient was last seen in the medical oncology clinic on 04/14/2020. At that time, she felt fine. Sweats had resolved.  Exam revealed no adenopathy or hepatosplenomegaly. Hematocrit was 40.6, hemoglobin 10.9, MCV 106.6, platelets 148,000, WBC 316,800 (ANC 4200; ALC 310,000). BMP was normal. Uric acid was 4.5. She continued acalabrutinib.  The patient called on 05/08/2020 and reported that diarrhea was not being controlled with imodium. She was prescribed Lomotil.  During the interim, she has been having a hard time. She has had progressively worsening diarrhea for 2 months. She has diarrhea at least 6 times per day. Imodium did not help. Lomotil affected her vision so she stopped taking it. She has been eating and drinking less because it causes diarrhea. She sometimes cannot make it to the bathroom in time. She already took acalabrutinib today.  She is weak and a little bit lightheaded. She has not been able to hear out of her left ear for 3 weeks. The ear does not pop when she yawns. She has lower back pain.   Past Medical History:  Diagnosis Date  . Allergy   . Anxiety   . Depression   . GERD (gastroesophageal reflux disease)   . Hemorrhoids   . Hypertension   . Leukemia, lymphoid ([place])    CLL  . Lobar pneumonia ([place])   . Osteopenia   . Personal history of chemotherapy     Past Surgical History:  Procedure Laterality Date  . ABDOMINAL HYSTERECTOMY    . APPENDECTOMY    . BREAST BIOPSY Left    bx x 3-neg  . COLON SURGERY     sigmoid resection  . COLONOSCOPY     2007, 2012  . COLONOSCOPY WITH PROPOFOL N/A  09/17/2015   Procedure: COLONOSCOPY WITH PROPOFOL;  Surgeon: Robert Bellow, MD;  Location: [facility] ENDOSCOPY;  Service: Endoscopy;  Laterality: N/A;  . OOPHORECTOMY    . SPINE SURGERY     L4-5    Family History  Problem Relation Age of Onset  . Osteoporosis Mother   . Hypertension Father   . Heart attack Father   . Stroke Maternal Grandfather   . Breast cancer Sister 54   Social History:  reports that she has been smoking cigarettes. She has a 35.00 pack-year smoking history. She has never used smokeless tobacco. She reports that she does not drink alcohol and does not use drugs. Currently smokes 5 cigarettes daily, but is working toward quitting. She previously smoked a pack a day for a "longtime".She lives in [place], [place].The patient is alone today.   Allergies:  Allergies  Allergen Reactions  . Augmentin [Amoxicillin-Pot Clavulanate] Swelling    tongue  . Biaxin [Clarithromycin] Swelling and Rash    tongue   Current Medications: Current Outpatient Medications  Medication Sig Dispense Refill  . albuterol (VENTOLIN HFA) 108 (90 Base) MCG/ACT inhaler TAKE 2 PUFFS BY MOUTH EVERY 6 HOURS AS NEEDED FOR WHEEZE OR SHORTNESS OF BREATH 108 g 9  . atorvastatin (LIPITOR) 10 MG tablet Take 1 tablet (10 mg total) by mouth daily. 90 tablet 4  . benazepril (LOTENSIN) 40 MG tablet Take 1 tablet (40 mg  total) by mouth daily. 90 tablet 4  . CALQUENCE 100 MG capsule TAKE 1 CAPSULE (100 MG TOTAL) BY MOUTH 2 (TWO) TIMES DAILY. 60 capsule 1  . diphenhydrAMINE (BENADRYL) 25 MG tablet Take 25 mg by mouth at bedtime as needed.    Marland Kitchen LORazepam (ATIVAN) 1 MG tablet Take 0.5 tablets (0.5 mg total) by mouth 2 (two) times daily as needed for anxiety. 30 tablet 4  . Multiple Vitamins-Minerals (MULTIVITAMIN WITH MINERALS) tablet Take 1 tablet by mouth daily.    Marland Kitchen PARoxetine (PAXIL) 30 MG tablet Take 1 tablet (30 mg total) by mouth daily. 90 tablet 4  . Triamcinolone Acetonide (NASACORT AQ NA) Place 2 Doses  into the nose daily as needed.     . diphenoxylate-atropine (LOMOTIL) 2.5-0.025 MG tablet Take 1 tablet by mouth 4 (four) times daily as needed for diarrhea or loose stools. (Patient not taking: Reported on 05/13/2020) 90 tablet 0   No current facility-administered medications for this visit.    Review of Systems  Constitutional: Positive for malaise/fatigue and weight loss (4 lbs). Negative for chills, diaphoresis and fever.  HENT: Positive for hearing loss (left ear). Negative for congestion, ear discharge, ear pain, nosebleeds, sinus pain, sore throat and tinnitus.   Eyes: Negative for blurred vision.  Respiratory: Negative for cough, hemoptysis, sputum production and shortness of breath.   Cardiovascular: Negative for chest pain, palpitations and leg swelling.  Gastrointestinal: Positive for diarrhea (x 2 months, 6x per day). Negative for abdominal pain, blood in stool, constipation, heartburn, melena, nausea and vomiting.       Not eating and drinking much.  Genitourinary: Negative for dysuria, frequency, hematuria and urgency.  Musculoskeletal: Positive for back pain (lower back). Negative for joint pain, myalgias and neck pain.  Skin: Negative for itching and rash.  Neurological: Positive for dizziness (lightheaded, mild) and weakness. Negative for tingling, sensory change and headaches.  Endo/Heme/Allergies: Negative for environmental allergies. Does not bruise/bleed easily.  Psychiatric/Behavioral: Negative for depression and memory loss. The patient is not nervous/anxious and does not have insomnia.   All other systems reviewed and are negative.  Performance status (ECOG): 1  Vitals Blood pressure (!) 86/41, pulse 72, temperature (!) 96.6 F (35.9 C), temperature source Tympanic, resp. rate 18, weight 148 lb (67.1 kg), SpO2 97 %.  Physical Exam Vitals and nursing note reviewed.  Constitutional:      General: She is not in acute distress.    Appearance: She is not diaphoretic.      Comments: Patient sitting in wheelchair. Cane by her side. Patient examined in wheelchair.  HENT:     Head: Normocephalic and atraumatic.     Comments: Short gray hair.    Right Ear: Tympanic membrane, ear canal and external ear normal. There is no impacted cerumen.     Left Ear: Tympanic membrane, ear canal and external ear normal. There is no impacted cerumen.     Mouth/Throat:     Mouth: Mucous membranes are moist.     Pharynx: Oropharynx is clear.  Eyes:     General: No scleral icterus.    Extraocular Movements: Extraocular movements intact.     Conjunctiva/sclera: Conjunctivae normal.     Pupils: Pupils are equal, round, and reactive to light.     Comments: Glasses. Brown eyes.  Cardiovascular:     Rate and Rhythm: Normal rate and regular rhythm.     Heart sounds: Normal heart sounds. No murmur heard.   Pulmonary:     Effort:  Pulmonary effort is normal. No respiratory distress.     Breath sounds: Normal breath sounds. No wheezing or rales.  Chest:     Chest wall: No tenderness.  Breasts:     Right: No axillary adenopathy or supraclavicular adenopathy.     Left: No axillary adenopathy or supraclavicular adenopathy.    Abdominal:     General: Bowel sounds are normal. There is no distension.     Palpations: Abdomen is soft. There is no splenomegaly (spleen tip underneath rib) or mass.     Tenderness: There is no abdominal tenderness. There is no guarding or rebound.  Musculoskeletal:        General: No swelling or tenderness. Normal range of motion.     Cervical back: Normal range of motion and neck supple.     Right lower leg: No edema.     Left lower leg: No edema.  Lymphadenopathy:     Head:     Right side of head: No preauricular, posterior auricular or occipital adenopathy.     Left side of head: No preauricular, posterior auricular or occipital adenopathy.     Cervical: No cervical adenopathy.     Upper Body:     Right upper body: No supraclavicular or  axillary adenopathy.     Left upper body: No supraclavicular or axillary adenopathy.     Lower Body: No right inguinal adenopathy. No left inguinal adenopathy.  Skin:    General: Skin is warm and dry.  Neurological:     Mental Status: She is alert and oriented to person, place, and time.  Psychiatric:        Behavior: Behavior normal.        Thought Content: Thought content normal.        Judgment: Judgment normal.    Appointment on 05/13/2020  Component Date Value Ref Range Status  . Uric Acid, Serum 05/13/2020 7.1  2.5 - 7.1 mg/dL Final   Performed at [facility], [address] [place], [place] [address]  . Sodium 05/13/2020 138  135 - 145 mmol/L Final  . Potassium 05/13/2020 3.4* 3.5 - 5.1 mmol/L Final  . Chloride 05/13/2020 104  98 - 111 mmol/L Final  . CO2 05/13/2020 25  22 - 32 mmol/L Final  . Glucose, Bld 05/13/2020 119* 70 - 99 mg/dL Final   Glucose reference range applies only to samples taken after fasting for at least 8 hours.  . BUN 05/13/2020 11  8 - 23 mg/dL Final  . Creatinine, Ser 05/13/2020 1.20* 0.44 - 1.00 mg/dL Final  . Calcium 05/13/2020 9.6  8.9 - 10.3 mg/dL Final  . Total Protein 05/13/2020 6.7  6.5 - 8.1 g/dL Final  . Albumin 05/13/2020 4.6  3.5 - 5.0 g/dL Final  . AST 05/13/2020 17  15 - 41 U/L Final  . ALT 05/13/2020 10  0 - 44 U/L Final  . Alkaline Phosphatase 05/13/2020 72  38 - 126 U/L Final  . Total Bilirubin 05/13/2020 0.7  0.3 - 1.2 mg/dL Final  . GFR, Estimated 05/13/2020 46* >60 mL/min Final   Comment: (NOTE) Calculated using the CKD-EPI Creatinine Equation (2021)   . Anion gap 05/13/2020 9  5 - 15 Final   Performed at [facility], [address] [place], [place] [address]    Assessment:  Zaryah Seckel is a 80 y.o. female with chronic lymphocytic leukemia(CLL). WBC has ranged between 22,000 - 101,7000 since 05/2011.  She has received Rituxanx 2  four week cycles (06/27/2015 and 05/27/2017). Hepatitis B  surface antigen and hepatitis B surface antibody were negative on 07/04/2015.  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  CCND1, ATM, chromosome 12, and TP53 were normal.   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  There was a CD5 and CD23 positive monoclonal B cell population with lambda light chain restriction, negative for FMC7 and CD38, representing  88% of leukocytes, >5,000/uL. There was no loss of, or aberrant expression of, the pan T cell  antigens to  suggest a neoplastic T cell process. CD4:CD8 ratio 2.0  No circulating blasts were detected. There was no immunophenotypic evidence of abnormal myeloid maturation.  IGH/BCL2 by FISH revealed 86.5% of nuclei positive for 3 IgH signals.  There were 2BCL2 signals. The IGH/BCL2 fusion signals associated with follicular lymphoma, and to a lesser extent diffuse large cell  lymphoma, were not observed. The presence of 3 IGH  signals suggests an IGH rearrangement with a gene other than BCL2.  She received ibrutinib from 04/24/2019 - 05/30/2019.  She stopped ibrutinib secondary to hematuria  She began acalabrutinib on 03/14/2020.  WBC has been followed: 107,200 on 04/24/2019, 206,800 on 04/30/2019, 184,700 on 05/07/2019, 209,500 on 05/15/2019, 237,700 on 05/21/2019, 256,400 on 06/01/2019, 220,400 on 06/04/2019, 81,400 on 06/19/2019, 69,500 on 07/18/2019, 80,500 on 08/15/2019, 95,500 on 09/12/2019, 107,500 on 10/11/2019, 83,10 on 11/08/2019, 104,700 on 12/06/2019, 116,200 on 02/06/2020, 144,300 on 03/13/2020, 326,900 on 03/31/2020, 325,600 on 04/07/2020, 316,800 on 04/14/2020, 322,300 on 04/20/2020, and 232,300 on 05/13/2020.  She developed hematuria. CT hematuria work up on 06/01/2019 was negative.  There was no retroperitoneal or periportal lymphadenopathy. Cystoscopy on 06/13/2019 was normal.  Her last episode of  gross hematuria was on 06/02/2019. Urinalysis on 06/07/2019 showed leukocytes 1+ and trace amounts of ketones and RBC. Urine culture revealed mixed urogenital flora.   She has a 35 pack year smoking history. She is in the low dose chest CT program. Low dose chest CTon 02/02/2018 revealed a new endobronchial lesionin the segmental bronchus to the medial segment of the right middle lobe. This may simply reflect an area of retained secretions, however, the possibility of an endobronchial neoplasm should be considered. Lung-RADS 4AS, suspicious.Low dose chest CTon 05/13/2020revealed Lung-RADS 2, benign appearance or behavior. Prior right middle lobe endobronchial lesion/mucous plugging wasno longer visualized. New mucous plugging in the right lower lobe.  Low dose chest CT on 10/11/2019 revealed complete atelectasis in the left lower lobe.  There was no pathologically enlarged mediastinal or hilar lymph nodes.  There was no axillary adenopathy.  There was mild diffuse bronchial wall thickening with mild centrilobular and paraseptal emphysema; imaging findings suggestive of underlying COPD.   Low dose chest CT on 10/11/2019 revealed Lung-RADS 0S, incomplete. Study was limited by complete atelectasis in the left lower lobe.  There was no pathologically enlarged mediastinal or hilar lymph nodes.  There was no axillary adenopathy.  There was aortic atherosclerosis, in addition to 3 vessel coronary artery disease. There was mild diffuse bronchial wall thickening with mild centrilobular and paraseptal emphysema; imaging findings suggestive of underlying COPD. Chest CT without contrast on 01/09/2020 revealed interval re-expansion of the left lower lobe.  There was a stable 3 mm left lower lobe pulmonary nodule, benign based on long-term stability. If the patient continues to meet screening eligibility criteria, repeat low-dose lung cancer screening CT in 1 year  could be performed.  There was splenomegaly  (partially visualized).  There was aortic atherosclerosis and emphysema.  She received her first dose of the [place] COVID-19 vaccine on 05/11/2019.  Symptomatically, she has had progressive diarrhea unrelieved by Imodium or Lomotil.  She is weak and a little bit lightheaded.  WBC is 232,300 (improved).  Plan: 1.   Labs today: CBC with diff, CMP, uric acid. 2. Chronic lymphocytic leukemia (CLL) Clinically,  she denies any B symptoms.   Exam reveals no adenopathy or hepatosplenomegaly. She has Rai stage II. 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  CCND1, ATM, chromosome 12, and TP53 were normal. She began ibrutinib on 04/24/2019 (discontinued on 05/30/2019).                     She developed blood-streaked sputum (slight) then significant hematuria.                     She declined further ibrutinib.            She began acalabrutinib on 03/14/2020.   She initially tolerated treatment well.   WBC has improved but remains elevated.   No clinical evidence of leukostasis.   Discuss Calquence associated diarrhea of 18-35%.     Hold acalabrutinib.   Anticipate dose reduction to Calquence 100 mg a day after resolution of diarrhea. 3. Grade III diarrhea  Patient having numerous stools per day.  Diarrhea unresponsive to Imodium or Lomotil.  Hold Calquence.  Send stool for C. difficile and GI panel by PCR  IV fluids daily until diarrhea resolves and patient no longer symptomatic. 4.   Health maintenance Patient is on the low-dose chest CT program.                     Chest CT  without contrast on 01/09/2020 revealed interval re-expansion of the left lower lobe.                                   There was a stable 3 mm left lower lobe pulmonary nodule.                      Next imaging on 01/08/2021. Bilateral  mammogram was due on 01/15/2020. 5.   IVF fluids.  Repeat orthostatics after fluid. 6.   Stool for GI by PCR, C diff. 7.   RN:  discuss BRAT diet. 8.   RTC daily this week for IVF and +/- BMP. 9.   RTC on 05/19/2020 for MD assessment, labs (CBC with diff, BMP) and +/- IVF.  I discussed the assessment and treatment plan with the patient.  The patient was provided an opportunity to ask questions and all were answered.  The patient agreed with the plan and demonstrated an understanding of the instructions.  The patient was advised to call back if the symptoms worsen or if the condition fails to improve as anticipated.   Lequita Asal, MD, PhD 05/13/2020, 5:00 PM  I, De Burrs, am acting as scribe for [employer]. Mike Gip, MD, PhD.  I, Melissa C. Mike Gip, MD, have reviewed the above documentation for accuracy and completeness, and I agree with the above.

## 2020-05-13 ENCOUNTER — Inpatient Hospital Stay (HOSPITAL_BASED_OUTPATIENT_CLINIC_OR_DEPARTMENT_OTHER): Payer: Medicare HMO | Admitting: Hematology and Oncology

## 2020-05-13 ENCOUNTER — Other Ambulatory Visit: Payer: Self-pay

## 2020-05-13 ENCOUNTER — Inpatient Hospital Stay: Payer: Medicare HMO | Attending: Hematology and Oncology

## 2020-05-13 ENCOUNTER — Inpatient Hospital Stay: Payer: Medicare HMO

## 2020-05-13 VITALS — BP 91/54 | HR 76 | Temp 96.6°F | Resp 18 | Wt 148.0 lb

## 2020-05-13 DIAGNOSIS — Z8249 Family history of ischemic heart disease and other diseases of the circulatory system: Secondary | ICD-10-CM | POA: Insufficient documentation

## 2020-05-13 DIAGNOSIS — R42 Dizziness and giddiness: Secondary | ICD-10-CM | POA: Insufficient documentation

## 2020-05-13 DIAGNOSIS — R69 Illness, unspecified: Secondary | ICD-10-CM | POA: Diagnosis not present

## 2020-05-13 DIAGNOSIS — K219 Gastro-esophageal reflux disease without esophagitis: Secondary | ICD-10-CM | POA: Diagnosis not present

## 2020-05-13 DIAGNOSIS — Z881 Allergy status to other antibiotic agents status: Secondary | ICD-10-CM | POA: Insufficient documentation

## 2020-05-13 DIAGNOSIS — F419 Anxiety disorder, unspecified: Secondary | ICD-10-CM | POA: Diagnosis not present

## 2020-05-13 DIAGNOSIS — Z823 Family history of stroke: Secondary | ICD-10-CM | POA: Insufficient documentation

## 2020-05-13 DIAGNOSIS — Z803 Family history of malignant neoplasm of breast: Secondary | ICD-10-CM | POA: Diagnosis not present

## 2020-05-13 DIAGNOSIS — R911 Solitary pulmonary nodule: Secondary | ICD-10-CM | POA: Insufficient documentation

## 2020-05-13 DIAGNOSIS — E860 Dehydration: Secondary | ICD-10-CM

## 2020-05-13 DIAGNOSIS — Z880 Allergy status to penicillin: Secondary | ICD-10-CM | POA: Insufficient documentation

## 2020-05-13 DIAGNOSIS — I2510 Atherosclerotic heart disease of native coronary artery without angina pectoris: Secondary | ICD-10-CM | POA: Insufficient documentation

## 2020-05-13 DIAGNOSIS — F17210 Nicotine dependence, cigarettes, uncomplicated: Secondary | ICD-10-CM | POA: Insufficient documentation

## 2020-05-13 DIAGNOSIS — R319 Hematuria, unspecified: Secondary | ICD-10-CM | POA: Diagnosis not present

## 2020-05-13 DIAGNOSIS — I700 Atherosclerosis of aorta: Secondary | ICD-10-CM | POA: Insufficient documentation

## 2020-05-13 DIAGNOSIS — R197 Diarrhea, unspecified: Secondary | ICD-10-CM

## 2020-05-13 DIAGNOSIS — Z8262 Family history of osteoporosis: Secondary | ICD-10-CM | POA: Insufficient documentation

## 2020-05-13 DIAGNOSIS — Z8719 Personal history of other diseases of the digestive system: Secondary | ICD-10-CM | POA: Diagnosis not present

## 2020-05-13 DIAGNOSIS — I10 Essential (primary) hypertension: Secondary | ICD-10-CM | POA: Insufficient documentation

## 2020-05-13 DIAGNOSIS — C9110 Chronic lymphocytic leukemia of B-cell type not having achieved remission: Secondary | ICD-10-CM | POA: Diagnosis not present

## 2020-05-13 DIAGNOSIS — Z79899 Other long term (current) drug therapy: Secondary | ICD-10-CM | POA: Diagnosis not present

## 2020-05-13 DIAGNOSIS — Z90721 Acquired absence of ovaries, unilateral: Secondary | ICD-10-CM | POA: Insufficient documentation

## 2020-05-13 DIAGNOSIS — R5383 Other fatigue: Secondary | ICD-10-CM | POA: Insufficient documentation

## 2020-05-13 DIAGNOSIS — Z9049 Acquired absence of other specified parts of digestive tract: Secondary | ICD-10-CM | POA: Insufficient documentation

## 2020-05-13 DIAGNOSIS — M545 Low back pain, unspecified: Secondary | ICD-10-CM | POA: Insufficient documentation

## 2020-05-13 DIAGNOSIS — R531 Weakness: Secondary | ICD-10-CM | POA: Diagnosis not present

## 2020-05-13 LAB — COMPREHENSIVE METABOLIC PANEL
ALT: 10 U/L (ref 0–44)
AST: 17 U/L (ref 15–41)
Albumin: 4.6 g/dL (ref 3.5–5.0)
Alkaline Phosphatase: 72 U/L (ref 38–126)
Anion gap: 9 (ref 5–15)
BUN: 11 mg/dL (ref 8–23)
CO2: 25 mmol/L (ref 22–32)
Calcium: 9.6 mg/dL (ref 8.9–10.3)
Chloride: 104 mmol/L (ref 98–111)
Creatinine, Ser: 1.2 mg/dL — ABNORMAL HIGH (ref 0.44–1.00)
GFR, Estimated: 46 mL/min — ABNORMAL LOW (ref >60)
Glucose, Bld: 119 mg/dL — ABNORMAL HIGH (ref 70–99)
Potassium: 3.4 mmol/L — ABNORMAL LOW (ref 3.5–5.1)
Sodium: 138 mmol/L (ref 135–145)
Total Bilirubin: 0.7 mg/dL (ref 0.3–1.2)
Total Protein: 6.7 g/dL (ref 6.5–8.1)

## 2020-05-13 LAB — CBC WITH DIFFERENTIAL/PLATELET
Abs Immature Granulocytes: 0.34 10*3/uL — ABNORMAL HIGH (ref 0.00–0.07)
Basophils Absolute: 0 10*3/uL (ref 0.0–0.1)
Basophils Relative: 0 %
Eosinophils Absolute: 0.1 10*3/uL (ref 0.0–0.5)
Eosinophils Relative: 0 %
HCT: 41.2 % (ref 36.0–46.0)
Hemoglobin: 12.2 g/dL (ref 12.0–15.0)
Immature Granulocytes: 0 %
Lymphocytes Relative: 97 %
Lymphs Abs: 225.4 10*3/uL — ABNORMAL HIGH (ref 0.7–4.0)
MCH: 31 pg (ref 26.0–34.0)
MCHC: 29.6 g/dL — ABNORMAL LOW (ref 30.0–36.0)
MCV: 104.8 fL — ABNORMAL HIGH (ref 80.0–100.0)
Monocytes Absolute: 1.2 10*3/uL — ABNORMAL HIGH (ref 0.1–1.0)
Monocytes Relative: 1 %
Neutro Abs: 5.3 10*3/uL (ref 1.7–7.7)
Neutrophils Relative %: 2 %
Platelets: 138 10*3/uL — ABNORMAL LOW (ref 150–400)
RBC: 3.93 MIL/uL (ref 3.87–5.11)
Smear Review: NORMAL
WBC: 232.3 10*3/uL (ref 4.0–10.5)
nRBC: 0 % (ref 0.0–0.2)

## 2020-05-13 LAB — URIC ACID: Uric Acid, Serum: 7.1 mg/dL (ref 2.5–7.1)

## 2020-05-13 MED ORDER — POTASSIUM CHLORIDE CRYS ER 20 MEQ PO TBCR: 20.0000 meq | EXTENDED_RELEASE_TABLET | Freq: Every day | ORAL | 0 refills | Status: DC

## 2020-05-13 MED ORDER — SODIUM CHLORIDE 0.9 % IV SOLN
Freq: Once | INTRAVENOUS | Status: AC
  Filled 2020-05-13: qty 250

## 2020-05-13 NOTE — Patient Instructions (Addendum)
  Stop acalabrutinib (Calquence). Went over [employer] and wrote it out for pt. B- bananas, r-rice, A-applesauce, T-toast  Pt was given instructions about how to collect stool sample. Pt to have sample and deliver back to lab same day it was collected with date and time sample collected

## 2020-05-13 NOTE — Progress Notes (Signed)
Pt reports diarrhea all the time for about 2 months, cant drink water, cant eat, has diverticulitis as well. immodium not working. Lomotil made vision worse so she stopped. Back pain 10/10. Patient states she feels very weak. BP 86/41,

## 2020-05-14 ENCOUNTER — Other Ambulatory Visit: Payer: Self-pay

## 2020-05-14 ENCOUNTER — Other Ambulatory Visit: Payer: Self-pay | Admitting: Hematology and Oncology

## 2020-05-14 ENCOUNTER — Inpatient Hospital Stay: Payer: Medicare HMO

## 2020-05-14 ENCOUNTER — Other Ambulatory Visit: Payer: Self-pay | Admitting: *Deleted

## 2020-05-14 VITALS — BP 90/48 | HR 77 | Temp 96.0°F | Resp 18 | Wt 149.8 lb

## 2020-05-14 DIAGNOSIS — M545 Low back pain, unspecified: Secondary | ICD-10-CM | POA: Diagnosis not present

## 2020-05-14 DIAGNOSIS — R5383 Other fatigue: Secondary | ICD-10-CM | POA: Diagnosis not present

## 2020-05-14 DIAGNOSIS — I700 Atherosclerosis of aorta: Secondary | ICD-10-CM | POA: Diagnosis not present

## 2020-05-14 DIAGNOSIS — C9110 Chronic lymphocytic leukemia of B-cell type not having achieved remission: Secondary | ICD-10-CM | POA: Diagnosis not present

## 2020-05-14 DIAGNOSIS — E860 Dehydration: Secondary | ICD-10-CM

## 2020-05-14 DIAGNOSIS — R319 Hematuria, unspecified: Secondary | ICD-10-CM | POA: Diagnosis not present

## 2020-05-14 DIAGNOSIS — I10 Essential (primary) hypertension: Secondary | ICD-10-CM | POA: Diagnosis not present

## 2020-05-14 DIAGNOSIS — R197 Diarrhea, unspecified: Secondary | ICD-10-CM

## 2020-05-14 DIAGNOSIS — R42 Dizziness and giddiness: Secondary | ICD-10-CM | POA: Diagnosis not present

## 2020-05-14 DIAGNOSIS — R531 Weakness: Secondary | ICD-10-CM | POA: Diagnosis not present

## 2020-05-14 DIAGNOSIS — R69 Illness, unspecified: Secondary | ICD-10-CM | POA: Diagnosis not present

## 2020-05-14 LAB — BASIC METABOLIC PANEL
Anion gap: 11 (ref 5–15)
BUN: 9 mg/dL (ref 8–23)
CO2: 23 mmol/L (ref 22–32)
Calcium: 9.4 mg/dL (ref 8.9–10.3)
Chloride: 106 mmol/L (ref 98–111)
Creatinine, Ser: 0.75 mg/dL (ref 0.44–1.00)
GFR, Estimated: >60 mL/min (ref >60)
Glucose, Bld: 97 mg/dL (ref 70–99)
Potassium: 3.4 mmol/L — ABNORMAL LOW (ref 3.5–5.1)
Sodium: 140 mmol/L (ref 135–145)

## 2020-05-14 LAB — C DIFFICILE QUICK SCREEN W PCR REFLEX
C Diff antigen: NEGATIVE
C Diff interpretation: NOT DETECTED
C Diff toxin: NEGATIVE

## 2020-05-14 MED ORDER — SODIUM CHLORIDE 0.9 % IV SOLN
Freq: Once | INTRAVENOUS | Status: AC
  Filled 2020-05-14: qty 250

## 2020-05-14 NOTE — Progress Notes (Signed)
Pt received 1L of IVF in clinic today. No complaints at d/c.

## 2020-05-14 NOTE — Progress Notes (Signed)
Per Dr Mike Gip pt to receive 1L of IVF over 2 hours in clinic today.

## 2020-05-15 ENCOUNTER — Inpatient Hospital Stay: Payer: Medicare HMO

## 2020-05-15 DIAGNOSIS — I10 Essential (primary) hypertension: Secondary | ICD-10-CM | POA: Diagnosis not present

## 2020-05-15 DIAGNOSIS — C9110 Chronic lymphocytic leukemia of B-cell type not having achieved remission: Secondary | ICD-10-CM | POA: Diagnosis not present

## 2020-05-15 DIAGNOSIS — R42 Dizziness and giddiness: Secondary | ICD-10-CM | POA: Diagnosis not present

## 2020-05-15 DIAGNOSIS — E860 Dehydration: Secondary | ICD-10-CM

## 2020-05-15 DIAGNOSIS — R5383 Other fatigue: Secondary | ICD-10-CM | POA: Diagnosis not present

## 2020-05-15 DIAGNOSIS — I700 Atherosclerosis of aorta: Secondary | ICD-10-CM | POA: Diagnosis not present

## 2020-05-15 DIAGNOSIS — M545 Low back pain, unspecified: Secondary | ICD-10-CM | POA: Diagnosis not present

## 2020-05-15 DIAGNOSIS — R69 Illness, unspecified: Secondary | ICD-10-CM | POA: Diagnosis not present

## 2020-05-15 DIAGNOSIS — R319 Hematuria, unspecified: Secondary | ICD-10-CM | POA: Diagnosis not present

## 2020-05-15 DIAGNOSIS — R197 Diarrhea, unspecified: Secondary | ICD-10-CM | POA: Diagnosis not present

## 2020-05-15 DIAGNOSIS — R531 Weakness: Secondary | ICD-10-CM | POA: Diagnosis not present

## 2020-05-15 LAB — BASIC METABOLIC PANEL
Anion gap: 7 (ref 5–15)
BUN: 7 mg/dL — ABNORMAL LOW (ref 8–23)
CO2: 25 mmol/L (ref 22–32)
Calcium: 9.1 mg/dL (ref 8.9–10.3)
Chloride: 108 mmol/L (ref 98–111)
Creatinine, Ser: 0.71 mg/dL (ref 0.44–1.00)
GFR, Estimated: >60 mL/min (ref >60)
Glucose, Bld: 102 mg/dL — ABNORMAL HIGH (ref 70–99)
Potassium: 3.5 mmol/L (ref 3.5–5.1)
Sodium: 140 mmol/L (ref 135–145)

## 2020-05-15 NOTE — Progress Notes (Signed)
[facility]  [address], [address] [place], [place] [address] Phone: [phone]  Fax: [phone]  Clinic Day: 05/19/20   Referring physician: [name], MD   Chief Complaint: Katherine [name] is a 80 y.o. female with chronic lymphocytic leukemia (CLL) who is seen for 1 week assessment on acalabrutinib (Calquence).  HPI: The patient was last seen in the medical oncology clinic on 05/13/2020. At that time, she described poorly controlled diarrhea.  Diarrhea was not controlled with Imodium or Lomotil.  Hematocrit was 41.2, hemoglobin 12.2, platelets 138,000, WBC 232,300. Potassium was 3.4. Creatinine was 1.20 (CrCl 46 ml/min). Uric acid was 7.1. She received IV fluids.  Acalabrutinib was held.  She began potassium 20 meq a day.  C diff was negative on 05/14/2020. GI panel by PCR was inadvertently not collected.  Potassium was 3.4. She received IV fluids.  Labs on 05/16/2020 revealed a hematocrit of 37.8, hemoglobin 11.4, MCV 105.6, platelets 106,000, WBC 156,700.  Potassium was 3.7. Creatinine was 0.78.  During the interim, she has been "so happy." Her diarrhea resolved over the weekend. Her stools are now formed. She had three bowel movements yesterday and one today. She has been trying new foods to see if they affect her bowel movements. She denies nausea and vomiting. Her dizziness has resolved.   Past Medical History:  Diagnosis Date  . Allergy   . Anxiety   . Depression   . GERD (gastroesophageal reflux disease)   . Hemorrhoids   . Hypertension   . Leukemia, lymphoid ([place])    CLL  . Lobar pneumonia ([place])   . Osteopenia   . Personal history of chemotherapy     Past Surgical History:  Procedure Laterality Date  . ABDOMINAL HYSTERECTOMY    . APPENDECTOMY    . BREAST BIOPSY Left    bx x 3-neg  . COLON SURGERY     sigmoid resection  . COLONOSCOPY     2007, 2012  . COLONOSCOPY WITH PROPOFOL N/A 09/17/2015   Procedure: COLONOSCOPY WITH  PROPOFOL;  Surgeon: Robert Bellow, MD;  Location: [facility] ENDOSCOPY;  Service: Endoscopy;  Laterality: N/A;  . OOPHORECTOMY    . SPINE SURGERY     L4-5    Family History  Problem Relation Age of Onset  . Osteoporosis Mother   . Hypertension Father   . Heart attack Father   . Stroke Maternal Grandfather   . Breast cancer Sister 31   Social History:  reports that she has been smoking cigarettes. She has a 35.00 pack-year smoking history. She has never used smokeless tobacco. She reports that she does not drink alcohol and does not use drugs. Currently smokes 5 cigarettes daily, but is working toward quitting. She previously smoked a pack a day for a "longtime".She lives in [place], [place]. Her daughter is Katherine [name].The patient is accompanied by Katherine [name] today.  Allergies:  Allergies  Allergen Reactions  . Augmentin [Amoxicillin-Pot Clavulanate] Swelling    tongue  . Biaxin [Clarithromycin] Swelling and Rash    tongue   Current Medications: Current Outpatient Medications  Medication Sig Dispense Refill  . albuterol (VENTOLIN HFA) 108 (90 Base) MCG/ACT inhaler TAKE 2 PUFFS BY MOUTH EVERY 6 HOURS AS NEEDED FOR WHEEZE OR SHORTNESS OF BREATH 108 g 9  . atorvastatin (LIPITOR) 10 MG tablet Take 1 tablet (10 mg total) by mouth daily. 90 tablet 4  . benazepril (LOTENSIN) 40 MG tablet Take 1 tablet (40 mg total) by mouth daily. 90 tablet  4  . CALQUENCE 100 MG capsule TAKE 1 CAPSULE (100 MG TOTAL) BY MOUTH 2 (TWO) TIMES DAILY. 60 capsule 1  . diphenhydrAMINE (BENADRYL) 25 MG tablet Take 25 mg by mouth at bedtime as needed.    Marland Kitchen LORazepam (ATIVAN) 1 MG tablet Take 0.5 tablets (0.5 mg total) by mouth 2 (two) times daily as needed for anxiety. 30 tablet 4  . Multiple Vitamins-Minerals (MULTIVITAMIN WITH MINERALS) tablet Take 1 tablet by mouth daily.    Marland Kitchen PARoxetine (PAXIL) 30 MG tablet Take 1 tablet (30 mg total) by mouth daily. 90 tablet 4  . potassium chloride SA (KLOR-CON) 20 MEQ tablet Take 1 tablet  (20 mEq total) by mouth daily. 10 tablet 0  . Triamcinolone Acetonide (NASACORT AQ NA) Place 2 Doses into the nose daily as needed.     . diphenoxylate-atropine (LOMOTIL) 2.5-0.025 MG tablet Take 1 tablet by mouth 4 (four) times daily as needed for diarrhea or loose stools. (Patient not taking: No sig reported) 90 tablet 0   No current facility-administered medications for this visit.    Review of Systems  Constitutional: Negative for chills, diaphoresis, fever, malaise/fatigue and weight loss (up 3 lbs).       Feels "so happy."  HENT: Positive for hearing loss (Left ear). Negative for congestion, ear discharge, ear pain, nosebleeds, sinus pain, sore throat and tinnitus.   Eyes: Negative for blurred vision.  Respiratory: Negative for cough, hemoptysis, sputum production and shortness of breath.   Cardiovascular: Negative for chest pain, palpitations and leg swelling.  Gastrointestinal: Negative for abdominal pain, blood in stool, constipation, diarrhea (resolved), heartburn, melena, nausea and vomiting.       Diet not liberalized.  Genitourinary: Negative for dysuria, frequency, hematuria and urgency.  Musculoskeletal: Positive for back pain (lower back). Negative for joint pain, myalgias and neck pain.  Skin: Negative for itching and rash.  Neurological: Negative for dizziness, tingling, sensory change, weakness and headaches.  Endo/Heme/Allergies: Negative for environmental allergies. Does not bruise/bleed easily.  Psychiatric/Behavioral: Negative for depression and memory loss. The patient is not nervous/anxious and does not have insomnia.   All other systems reviewed and are negative.  Performance status (ECOG): 1  Vitals Blood pressure (!) 101/50, pulse 86, temperature 98.7 F (37.1 C), resp. rate 20, weight 152 lb 14.2 oz (69.3 kg), SpO2 100 %.  Physical Exam Vitals and nursing note reviewed.  Constitutional:      General: She is not in acute distress.    Appearance: She is  not diaphoretic.     Comments: Cane by her side.   HENT:     Head: Normocephalic and atraumatic.     Comments: Short gray hair. Eyes:     General: No scleral icterus.    Conjunctiva/sclera: Conjunctivae normal.     Pupils: Pupils are equal, round, and reactive to light.     Comments: Glasses. Brown eyes.  Cardiovascular:     Rate and Rhythm: Normal rate and regular rhythm.  Pulmonary:     Effort: Pulmonary effort is normal. No respiratory distress.     Breath sounds: Normal breath sounds. No wheezing or rales.  Chest:     Chest wall: No tenderness.  Breasts:     Right: No axillary adenopathy or supraclavicular adenopathy.     Left: No axillary adenopathy or supraclavicular adenopathy.    Abdominal:     General: Bowel sounds are normal. There is no distension.     Palpations: Abdomen is soft. There is no hepatomegaly,  splenomegaly or mass.     Tenderness: There is no abdominal tenderness. There is no guarding or rebound.  Musculoskeletal:        General: No swelling or tenderness. Normal range of motion.     Right lower leg: Edema present.     Left lower leg: Edema present.  Lymphadenopathy:     Head:     Right side of head: No preauricular, posterior auricular or occipital adenopathy.     Left side of head: No preauricular, posterior auricular or occipital adenopathy.     Upper Body:     Right upper body: No supraclavicular or axillary adenopathy.     Left upper body: No supraclavicular or axillary adenopathy.     Lower Body: No right inguinal adenopathy. No left inguinal adenopathy.  Skin:    General: Skin is warm and dry.  Neurological:     Mental Status: She is alert and oriented to person, place, and time.  Psychiatric:        Behavior: Behavior normal.        Thought Content: Thought content normal.        Judgment: Judgment normal.    No visits with results within 3 Day(s) from this visit.  Latest known visit with results is:  Appointment on 05/16/2020   Component Date Value Ref Range Status  . WBC 05/16/2020 156.7* 4.0 - 10.5 K/uL Final   Comment: REPEATED TO VERIFY THIS CRITICAL RESULT HAS VERIFIED AND BEEN CALLED TO DR Mike Gip BY KIM ROOS ON 03 25 2022 AT 1001, AND HAS BEEN READ BACK.    Marland Kitchen RBC 05/16/2020 3.58* 3.87 - 5.11 MIL/uL Final  . Hemoglobin 05/16/2020 11.4* 12.0 - 15.0 g/dL Final  . HCT 05/16/2020 37.8  36.0 - 46.0 % Final  . MCV 05/16/2020 105.6* 80.0 - 100.0 fL Final  . MCH 05/16/2020 31.8  26.0 - 34.0 pg Final  . MCHC 05/16/2020 30.2  30.0 - 36.0 g/dL Final  . RDW 05/16/2020 15.5  11.5 - 15.5 % Final  . Platelets 05/16/2020 106* 150 - 400 K/uL Final  . nRBC 05/16/2020 0.0  0.0 - 0.2 % Final  . Neutrophils Relative % 05/16/2020 2  % Final  . Neutro Abs 05/16/2020 3.2  1.7 - 7.7 K/uL Final  . Lymphocytes Relative 05/16/2020 97  % Final  . Lymphs Abs 05/16/2020 152.4* 0.7 - 4.0 K/uL Final  . Monocytes Relative 05/16/2020 1  % Final  . Monocytes Absolute 05/16/2020 0.8  0.1 - 1.0 K/uL Final  . Eosinophils Relative 05/16/2020 0  % Final  . Eosinophils Absolute 05/16/2020 0.1  0.0 - 0.5 K/uL Final  . Basophils Relative 05/16/2020 0  % Final  . Basophils Absolute 05/16/2020 0.0  0.0 - 0.1 K/uL Final  . WBC Morphology 05/16/2020 Abnormal lymphocytes present   Final   Comment: SMUDGE CELLS DIFF CONFIRMED BY SMEAR. CONSISTANT WITH KNOWN CLL   . RBC Morphology 05/16/2020 UNREMARKABLE   Final  . Smear Review 05/16/2020 PLATELETS APPEAR DECREASED   Final   PLATAELETS VARY IN SIZE WITH LARGE PLATELETS NOTED.  . Immature Granulocytes 05/16/2020 0  % Final  . Abs Immature Granulocytes 05/16/2020 0.16* 0.00 - 0.07 K/uL Final   Performed at [facility], [address], [place], [place] [address]  . Sodium 05/16/2020 140  135 - 145 mmol/L Final  . Potassium 05/16/2020 3.7  3.5 - 5.1 mmol/L Final  . Chloride 05/16/2020 105  98 - 111 mmol/L Final  . CO2  05/16/2020 25  22 - 32 mmol/L Final  . Glucose, Bld 05/16/2020 100* 70 -  99 mg/dL Final   Glucose reference range applies only to samples taken after fasting for at least 8 hours.  . BUN 05/16/2020 6* 8 - 23 mg/dL Final  . Creatinine, Ser 05/16/2020 0.78  0.44 - 1.00 mg/dL Final  . Calcium 05/16/2020 8.8* 8.9 - 10.3 mg/dL Final  . GFR, Estimated 05/16/2020 >60  >60 mL/min Final   Comment: (NOTE) Calculated using the CKD-EPI Creatinine Equation (2021)   . Anion gap 05/16/2020 10  5 - 15 Final   Performed at [facility], [address] [place], [place] [address]    Assessment:  Anysia Choi is a 80 y.o. female with chronic lymphocytic leukemia(CLL). WBC has ranged between 22,000 - 101,7000 since 05/2011.  She has received Rituxanx 2 four week cycles (06/27/2015 and 05/27/2017). Hepatitis B surface antigen and hepatitis B surface antibody were negative on 07/04/2015.  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  CCND1, ATM, chromosome 12, and TP53 were normal.   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  There was a CD5 and CD23 positive monoclonal B cell population with lambda light chain restriction, negative for FMC7 and CD38, representing  88% of leukocytes, >5,000/uL. There was no loss of, or aberrant expression of, the pan T cell  antigens to  suggest a neoplastic T cell process. CD4:CD8 ratio 2.0  No circulating blasts were detected. There was no immunophenotypic evidence of abnormal myeloid maturation.  IGH/BCL2 by FISH revealed 86.5% of nuclei positive for 3 IgH signals.  There were 2BCL2 signals. The IGH/BCL2 fusion signals associated with follicular lymphoma, and to a lesser extent diffuse large cell  lymphoma, were not observed. The presence of 3 IGH  signals suggests an IGH rearrangement with a gene other than BCL2.  She received ibrutinib from 04/24/2019 - 05/30/2019.  She stopped ibrutinib secondary  to hematuria  She began acalabrutinib on 03/14/2020. Acalabrutinob has been on hold since 05/13/2020.  WBC has been followed: 107,200 on 04/24/2019, 206,800 on 04/30/2019, 184,700 on 05/07/2019, 209,500 on 05/15/2019, 237,700 on 05/21/2019, 256,400 on 06/01/2019, 220,400 on 06/04/2019, 81,400 on 06/19/2019, 69,500 on 07/18/2019, 80,500 on 08/15/2019, 95,500 on 09/12/2019, 107,500 on 10/11/2019, 83,10 on 11/08/2019, 104,700 on 12/06/2019, 116,200 on 02/06/2020, 144,300 on 03/13/2020, 326,900 on 03/31/2020, 325,600 on 04/07/2020, 316,800 on 04/14/2020, 322,300 on 04/20/2020, 232,300 on 05/13/2020, 156,700 on 05/16/2020, and 123,600 on 05/19/2020.  She developed hematuria. CT hematuria work up on 06/01/2019 was negative.  There was no retroperitoneal or periportal lymphadenopathy. Cystoscopy on 06/13/2019 was normal.  Her last episode of gross hematuria was on 06/02/2019. Urinalysis on 06/07/2019 showed leukocytes 1+ and trace amounts of ketones and RBC. Urine culture revealed mixed urogenital flora.   She has a 35 pack year smoking history. She is in the low dose chest CT program. Low dose chest CTon 02/02/2018 revealed a new endobronchial lesionin the segmental bronchus to the medial segment of the right middle lobe. This may simply reflect an area of retained secretions, however, the possibility of an endobronchial neoplasm should be considered. Lung-RADS 4AS, suspicious.Low dose chest CTon 05/13/2020revealed Lung-RADS 2, benign appearance or behavior. Prior right middle lobe endobronchial lesion/mucous plugging wasno longer visualized. New mucous plugging in the right lower lobe.  Low dose chest CT on 10/11/2019 revealed complete atelectasis in the left  lower lobe.  There was no pathologically enlarged mediastinal or hilar lymph nodes.  There was no axillary adenopathy.  There was mild diffuse bronchial wall thickening with mild centrilobular and paraseptal emphysema; imaging findings suggestive  of underlying COPD.   Low dose chest CT on 10/11/2019 revealed Lung-RADS 0S, incomplete. Study was limited by complete atelectasis in the left lower lobe.  There was no pathologically enlarged mediastinal or hilar lymph nodes.  There was no axillary adenopathy.  There was aortic atherosclerosis, in addition to 3 vessel coronary artery disease. There was mild diffuse bronchial wall thickening with mild centrilobular and paraseptal emphysema; imaging findings suggestive of underlying COPD. Chest CT without contrast on 01/09/2020 revealed interval re-expansion of the left lower lobe.  There was a stable 3 mm left lower lobe pulmonary nodule, benign based on long-term stability. If the patient continues to meet screening eligibility criteria, repeat low-dose lung cancer screening CT in 1 year could be performed.  There was splenomegaly (partially visualized).  There was aortic atherosclerosis and emphysema.  She received her first dose of the [employer] COVID-19 vaccine on 05/11/2019.  Symptomatically, she is doing well.  Diarrhea has resolved.  Plan: 1.   Labs today: CBC with diff, BMP. 2. Chronic lymphocytic leukemia (CLL) Clinically,  she is doing well. She has Rai stage II. 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  CCND1, ATM, chromosome 12, and TP53 were normal. She began ibrutinib on 04/24/2019 (discontinued on 05/30/2019).                     She developed blood-streaked sputum (slight) then significant hematuria.                     She declined further ibrutinib.            She began acalabrutinib on 03/14/2020 (held on 05/13/2020 secondary to diarrhea).   WBC has decreased to 123,600.   Diarrhea associated with acalabrutinib is 18-35%.     Discuss holding acalabrutinib x 1-2 weeks then restarting at 100 mg a day. 3. Health  maintenance Patient is on the low-dose chest CT program.                     Chest CT  without contrast on 01/09/2020 revealed interval re-expansion of the left lower lobe.                                   There was a stable 3 mm left lower lobe pulmonary nodule.       Chest CT planned for 01/08/2021. Bilateral mammogram is overdue. 4.   Hypokalemia, resolved      Potassium is 3.9 (available after clinic).      No further potassium needed. 5.   RN: Call patient with potassium. 6.   Continue to hold Calquence. 7.   Anticipate f/u 2 weeks after Calquence re-started. Patient to call for an appointment. 8.   RTC in 1 month for MD assessment, labs (CBC with diff, CMP, Mg), and assessment on Calquence.  I discussed the assessment and treatment plan with the patient.  The patient was provided an opportunity to ask questions and all were answered.  The patient agreed with the plan and demonstrated an understanding of the instructions.  The patient was advised to call back  if the symptoms worsen or if the condition fails to improve as anticipated.  I provided 20 minutes of face-to-face time during this this encounter and > 50% was spent counseling as documented under my assessment and plan.   Lequita Asal, MD, PhD 05/19/2020, 3:06 PM  I, De Burrs, am acting as scribe for [employer]. Mike Gip, MD, PhD.  I, Juliyah Mergen C. Mike Gip, MD, have reviewed the above documentation for accuracy and completeness, and I agree with the above.

## 2020-05-16 ENCOUNTER — Telehealth: Payer: Self-pay | Admitting: Hematology and Oncology

## 2020-05-16 ENCOUNTER — Inpatient Hospital Stay: Payer: Medicare HMO

## 2020-05-16 VITALS — BP 110/53 | HR 71 | Temp 97.4°F | Resp 18

## 2020-05-16 DIAGNOSIS — C9110 Chronic lymphocytic leukemia of B-cell type not having achieved remission: Secondary | ICD-10-CM

## 2020-05-16 DIAGNOSIS — E860 Dehydration: Secondary | ICD-10-CM

## 2020-05-16 DIAGNOSIS — R69 Illness, unspecified: Secondary | ICD-10-CM | POA: Diagnosis not present

## 2020-05-16 DIAGNOSIS — R319 Hematuria, unspecified: Secondary | ICD-10-CM | POA: Diagnosis not present

## 2020-05-16 DIAGNOSIS — R197 Diarrhea, unspecified: Secondary | ICD-10-CM

## 2020-05-16 DIAGNOSIS — R5383 Other fatigue: Secondary | ICD-10-CM | POA: Diagnosis not present

## 2020-05-16 DIAGNOSIS — I10 Essential (primary) hypertension: Secondary | ICD-10-CM | POA: Diagnosis not present

## 2020-05-16 DIAGNOSIS — R42 Dizziness and giddiness: Secondary | ICD-10-CM | POA: Diagnosis not present

## 2020-05-16 DIAGNOSIS — R531 Weakness: Secondary | ICD-10-CM | POA: Diagnosis not present

## 2020-05-16 DIAGNOSIS — M545 Low back pain, unspecified: Secondary | ICD-10-CM | POA: Diagnosis not present

## 2020-05-16 DIAGNOSIS — I700 Atherosclerosis of aorta: Secondary | ICD-10-CM | POA: Diagnosis not present

## 2020-05-16 LAB — BASIC METABOLIC PANEL
Anion gap: 10 (ref 5–15)
BUN: 6 mg/dL — ABNORMAL LOW (ref 8–23)
CO2: 25 mmol/L (ref 22–32)
Calcium: 8.8 mg/dL — ABNORMAL LOW (ref 8.9–10.3)
Chloride: 105 mmol/L (ref 98–111)
Creatinine, Ser: 0.78 mg/dL (ref 0.44–1.00)
GFR, Estimated: >60 mL/min (ref >60)
Glucose, Bld: 100 mg/dL — ABNORMAL HIGH (ref 70–99)
Potassium: 3.7 mmol/L (ref 3.5–5.1)
Sodium: 140 mmol/L (ref 135–145)

## 2020-05-16 LAB — CBC WITH DIFFERENTIAL/PLATELET
Abs Immature Granulocytes: 0.16 10*3/uL — ABNORMAL HIGH (ref 0.00–0.07)
Basophils Absolute: 0 10*3/uL (ref 0.0–0.1)
Basophils Relative: 0 %
Eosinophils Absolute: 0.1 10*3/uL (ref 0.0–0.5)
Eosinophils Relative: 0 %
HCT: 37.8 % (ref 36.0–46.0)
Hemoglobin: 11.4 g/dL — ABNORMAL LOW (ref 12.0–15.0)
Immature Granulocytes: 0 %
Lymphocytes Relative: 97 %
Lymphs Abs: 152.4 10*3/uL — ABNORMAL HIGH (ref 0.7–4.0)
MCH: 31.8 pg (ref 26.0–34.0)
MCHC: 30.2 g/dL (ref 30.0–36.0)
MCV: 105.6 fL — ABNORMAL HIGH (ref 80.0–100.0)
Monocytes Absolute: 0.8 10*3/uL (ref 0.1–1.0)
Monocytes Relative: 1 %
Neutro Abs: 3.2 10*3/uL (ref 1.7–7.7)
Neutrophils Relative %: 2 %
Platelets: 106 10*3/uL — ABNORMAL LOW (ref 150–400)
RBC: 3.58 MIL/uL — ABNORMAL LOW (ref 3.87–5.11)
RDW: 15.5 % (ref 11.5–15.5)
Smear Review: DECREASED
WBC Morphology: ABNORMAL
WBC: 156.7 10*3/uL (ref 4.0–10.5)
nRBC: 0 % (ref 0.0–0.2)

## 2020-05-16 MED ORDER — SODIUM CHLORIDE 0.9 % IV SOLN
Freq: Once | INTRAVENOUS | Status: AC
  Filled 2020-05-16: qty 250

## 2020-05-16 NOTE — Progress Notes (Signed)
Pt states she is here to receive a half of bag of fluids over 1 hour. Per pt request 500 ml bag of NS initiated at 500 ml to run over 1 hour. Pt has edema in bil ankles and feet. Pt states I have had this edema for a long time. Feet elevated in recliner. Pt resting so well I asked if she would like to receive another 540ml of fluid for another hour to hold her over for the weekend. Pt agreed to receiving 1 liter of NS ( over 2 hours) which is what was ordered for today. Feels much better at time of discharge. Ambulated to BR without wheelchair.

## 2020-05-16 NOTE — Telephone Encounter (Signed)
Re:  Diarrhea and hypokalemia  I spoke to the patient today regarding her diarrhea.  She had 4 or 5 bowel movements today.  Diarrhea may be slowing down.  She opted to receive 1 L of IV fluids today given the upcoming weekend.  We discussed the importance of fluids well as caloric intake.  She is on a [employer].  She will take 1 more day of potassium 20 mEq and then stop.  She will not take the remaining doses of oral potassium.  She has a follow-up in clinic on Monday, 05/19/2020.   Lequita Asal, MD

## 2020-05-19 ENCOUNTER — Other Ambulatory Visit: Payer: Self-pay

## 2020-05-19 ENCOUNTER — Telehealth: Payer: Self-pay | Admitting: *Deleted

## 2020-05-19 ENCOUNTER — Inpatient Hospital Stay: Payer: Medicare HMO

## 2020-05-19 ENCOUNTER — Telehealth: Payer: Self-pay

## 2020-05-19 ENCOUNTER — Encounter: Payer: Self-pay | Admitting: Hematology and Oncology

## 2020-05-19 ENCOUNTER — Inpatient Hospital Stay (HOSPITAL_BASED_OUTPATIENT_CLINIC_OR_DEPARTMENT_OTHER): Payer: Medicare HMO | Admitting: Hematology and Oncology

## 2020-05-19 ENCOUNTER — Other Ambulatory Visit: Payer: Self-pay | Admitting: Pharmacist

## 2020-05-19 VITALS — BP 101/50 | HR 86 | Temp 98.7°F | Resp 20 | Wt 152.9 lb

## 2020-05-19 DIAGNOSIS — R197 Diarrhea, unspecified: Secondary | ICD-10-CM

## 2020-05-19 DIAGNOSIS — C9110 Chronic lymphocytic leukemia of B-cell type not having achieved remission: Secondary | ICD-10-CM | POA: Diagnosis not present

## 2020-05-19 DIAGNOSIS — R69 Illness, unspecified: Secondary | ICD-10-CM | POA: Diagnosis not present

## 2020-05-19 DIAGNOSIS — R5383 Other fatigue: Secondary | ICD-10-CM | POA: Diagnosis not present

## 2020-05-19 DIAGNOSIS — M545 Low back pain, unspecified: Secondary | ICD-10-CM | POA: Diagnosis not present

## 2020-05-19 DIAGNOSIS — R42 Dizziness and giddiness: Secondary | ICD-10-CM | POA: Diagnosis not present

## 2020-05-19 DIAGNOSIS — E860 Dehydration: Secondary | ICD-10-CM

## 2020-05-19 DIAGNOSIS — R319 Hematuria, unspecified: Secondary | ICD-10-CM | POA: Diagnosis not present

## 2020-05-19 DIAGNOSIS — I700 Atherosclerosis of aorta: Secondary | ICD-10-CM | POA: Diagnosis not present

## 2020-05-19 DIAGNOSIS — I10 Essential (primary) hypertension: Secondary | ICD-10-CM | POA: Diagnosis not present

## 2020-05-19 DIAGNOSIS — R531 Weakness: Secondary | ICD-10-CM | POA: Diagnosis not present

## 2020-05-19 LAB — BASIC METABOLIC PANEL
Anion gap: 7 (ref 5–15)
BUN: <5 mg/dL — ABNORMAL LOW (ref 8–23)
CO2: 26 mmol/L (ref 22–32)
Calcium: 8.9 mg/dL (ref 8.9–10.3)
Chloride: 106 mmol/L (ref 98–111)
Creatinine, Ser: 0.93 mg/dL (ref 0.44–1.00)
GFR, Estimated: >60 mL/min (ref >60)
Glucose, Bld: 108 mg/dL — ABNORMAL HIGH (ref 70–99)
Potassium: 3.9 mmol/L (ref 3.5–5.1)
Sodium: 139 mmol/L (ref 135–145)

## 2020-05-19 LAB — CBC WITH DIFFERENTIAL/PLATELET
Abs Immature Granulocytes: 0.14 10*3/uL — ABNORMAL HIGH (ref 0.00–0.07)
Basophils Absolute: 0.1 10*3/uL (ref 0.0–0.1)
Basophils Relative: 0 %
Eosinophils Absolute: 0.1 10*3/uL (ref 0.0–0.5)
Eosinophils Relative: 0 %
HCT: 38.4 % (ref 36.0–46.0)
Hemoglobin: 12.1 g/dL (ref 12.0–15.0)
Immature Granulocytes: 0 %
Lymphocytes Relative: 97 %
Lymphs Abs: 119 10*3/uL — ABNORMAL HIGH (ref 0.7–4.0)
MCH: 32.2 pg (ref 26.0–34.0)
MCHC: 31.5 g/dL (ref 30.0–36.0)
MCV: 102.1 fL — ABNORMAL HIGH (ref 80.0–100.0)
Monocytes Absolute: 1.6 10*3/uL — ABNORMAL HIGH (ref 0.1–1.0)
Monocytes Relative: 1 %
Neutro Abs: 2.8 10*3/uL (ref 1.7–7.7)
Neutrophils Relative %: 2 %
Platelets: 116 10*3/uL — ABNORMAL LOW (ref 150–400)
RBC: 3.76 MIL/uL — ABNORMAL LOW (ref 3.87–5.11)
RDW: 14.4 % (ref 11.5–15.5)
Smear Review: NORMAL
WBC: 123.6 10*3/uL (ref 4.0–10.5)
nRBC: 0 % (ref 0.0–0.2)

## 2020-05-19 LAB — GASTROINTESTINAL PANEL BY PCR, STOOL (REPLACES STOOL CULTURE)

## 2020-05-19 MED ORDER — CALQUENCE 100 MG PO CAPS: 100.0000 mg | ORAL_CAPSULE | Freq: Every day | ORAL | 1 refills | Status: DC

## 2020-05-19 NOTE — Telephone Encounter (Signed)
[place] spoke with patient regarding her potassium level.

## 2020-05-19 NOTE — Progress Notes (Signed)
Oral Chemotherapy Pharmacist Encounter   Dr. Mike Gip is changing patient from 100mg  twice daily to 100mg  daily. Updated prescription sent to [facility]. [place] is due to give her a call on 05/23/20 to set-up next refill.   [name], PharmD, BCPS, BCOP, CPP Hematology/Oncology Clinical Pharmacist [facility] [phone]  05/19/2020 3:41 PM

## 2020-05-19 NOTE — Progress Notes (Signed)
msg sent to [facility], L, pharmacist regarding Calquence instructions. "new dose of Calquence 100 mg a day when restarted.  Continue to hold Calquence. Anticipate f/u 2 weeks after Calquence re-started. Patient to call for an appointment."

## 2020-05-19 NOTE — Telephone Encounter (Signed)
I spoke with patient in regards to recent lab report and Labs shown that her Potassium is 3.9.

## 2020-05-19 NOTE — Patient Instructions (Signed)
  Anticipate discontinuation of oral potassium (nurse to call you with results).  Continue to hold Calquence for 1-2 weeks until bowel movements normal on your regular diet and energy level back to normal.  Please call clinic when Calquence is started.  At that time, begin Calquence 100 mg by mouth once a day.  We will see you 2 weeks after starting Calquence.

## 2020-05-20 ENCOUNTER — Other Ambulatory Visit (HOSPITAL_COMMUNITY): Payer: Self-pay

## 2020-06-02 ENCOUNTER — Other Ambulatory Visit (HOSPITAL_COMMUNITY): Payer: Self-pay

## 2020-06-03 ENCOUNTER — Telehealth: Payer: Self-pay

## 2020-06-03 NOTE — Telephone Encounter (Signed)
Patient called and stated she is supposed to start her Calquence 100 mg today. She has really bad sinus congestion and does not feel well today. She is wondering if she should continue with starting the medication today. Please advise.

## 2020-06-11 ENCOUNTER — Telehealth: Payer: Self-pay | Admitting: *Deleted

## 2020-06-11 NOTE — Telephone Encounter (Signed)
Patient called stating that she took her first dose of Cosequin today

## 2020-06-12 NOTE — Telephone Encounter (Signed)
06/12/2020 Spoke w/ pt and informed her that appts on 4/28 needed to be moved out due to her starting her medication on 4/20. Appt r/s for 5/4 @ 10:15, pt confirmed appt  SRW

## 2020-06-18 ENCOUNTER — Ambulatory Visit: Payer: Self-pay | Admitting: Urology

## 2020-06-19 ENCOUNTER — Ambulatory Visit: Payer: Medicare HMO | Admitting: Hematology and Oncology

## 2020-06-19 ENCOUNTER — Other Ambulatory Visit: Payer: Medicare HMO

## 2020-06-20 ENCOUNTER — Other Ambulatory Visit: Payer: Self-pay

## 2020-06-20 DIAGNOSIS — C9110 Chronic lymphocytic leukemia of B-cell type not having achieved remission: Secondary | ICD-10-CM

## 2020-06-25 ENCOUNTER — Encounter: Payer: Self-pay | Admitting: Oncology

## 2020-06-25 ENCOUNTER — Inpatient Hospital Stay: Payer: Medicare HMO | Attending: Oncology

## 2020-06-25 ENCOUNTER — Other Ambulatory Visit: Payer: Self-pay

## 2020-06-25 ENCOUNTER — Inpatient Hospital Stay (HOSPITAL_BASED_OUTPATIENT_CLINIC_OR_DEPARTMENT_OTHER): Payer: Medicare HMO | Admitting: Oncology

## 2020-06-25 VITALS — BP 125/59 | HR 84 | Temp 96.8°F | Resp 14 | Wt 145.9 lb

## 2020-06-25 DIAGNOSIS — R197 Diarrhea, unspecified: Secondary | ICD-10-CM | POA: Insufficient documentation

## 2020-06-25 DIAGNOSIS — C9110 Chronic lymphocytic leukemia of B-cell type not having achieved remission: Secondary | ICD-10-CM | POA: Diagnosis not present

## 2020-06-25 DIAGNOSIS — Z803 Family history of malignant neoplasm of breast: Secondary | ICD-10-CM | POA: Diagnosis not present

## 2020-06-25 DIAGNOSIS — R109 Unspecified abdominal pain: Secondary | ICD-10-CM | POA: Insufficient documentation

## 2020-06-25 DIAGNOSIS — Z79899 Other long term (current) drug therapy: Secondary | ICD-10-CM | POA: Diagnosis not present

## 2020-06-25 DIAGNOSIS — F17210 Nicotine dependence, cigarettes, uncomplicated: Secondary | ICD-10-CM | POA: Insufficient documentation

## 2020-06-25 DIAGNOSIS — R69 Illness, unspecified: Secondary | ICD-10-CM | POA: Diagnosis not present

## 2020-06-25 LAB — CBC WITH DIFFERENTIAL/PLATELET
Abs Immature Granulocytes: 0.09 10*3/uL — ABNORMAL HIGH (ref 0.00–0.07)
Basophils Absolute: 0.1 10*3/uL (ref 0.0–0.1)
Basophils Relative: 0 %
Eosinophils Absolute: 0.1 10*3/uL (ref 0.0–0.5)
Eosinophils Relative: 0 %
HCT: 42.4 % (ref 36.0–46.0)
Hemoglobin: 13.2 g/dL (ref 12.0–15.0)
Immature Granulocytes: 0 %
Lymphocytes Relative: 98 %
Lymphs Abs: 123.7 10*3/uL — ABNORMAL HIGH (ref 0.7–4.0)
MCH: 31.4 pg (ref 26.0–34.0)
MCHC: 31.1 g/dL (ref 30.0–36.0)
MCV: 100.7 fL — ABNORMAL HIGH (ref 80.0–100.0)
Monocytes Absolute: 0.6 10*3/uL (ref 0.1–1.0)
Monocytes Relative: 1 %
Neutro Abs: 1.8 10*3/uL (ref 1.7–7.7)
Neutrophils Relative %: 1 %
Platelets: 102 10*3/uL — ABNORMAL LOW (ref 150–400)
RBC: 4.21 MIL/uL (ref 3.87–5.11)
RDW: 14.4 % (ref 11.5–15.5)
WBC Morphology: ABNORMAL
WBC: 126.4 10*3/uL (ref 4.0–10.5)
nRBC: 0 % (ref 0.0–0.2)

## 2020-06-25 LAB — MAGNESIUM: Magnesium: 2.2 mg/dL (ref 1.7–2.4)

## 2020-06-25 LAB — COMPREHENSIVE METABOLIC PANEL
ALT: 11 U/L (ref 0–44)
AST: 22 U/L (ref 15–41)
Albumin: 4.6 g/dL (ref 3.5–5.0)
Alkaline Phosphatase: 98 U/L (ref 38–126)
Anion gap: 7 (ref 5–15)
BUN: 9 mg/dL (ref 8–23)
CO2: 28 mmol/L (ref 22–32)
Calcium: 9.2 mg/dL (ref 8.9–10.3)
Chloride: 101 mmol/L (ref 98–111)
Creatinine, Ser: 0.72 mg/dL (ref 0.44–1.00)
GFR, Estimated: >60 mL/min (ref >60)
Glucose, Bld: 92 mg/dL (ref 70–99)
Potassium: 4.2 mmol/L (ref 3.5–5.1)
Sodium: 136 mmol/L (ref 135–145)
Total Bilirubin: 1 mg/dL (ref 0.3–1.2)
Total Protein: 6.5 g/dL (ref 6.5–8.1)

## 2020-06-25 NOTE — Progress Notes (Signed)
[facility]  [address] [place], [place] [address] Phone: [phone]  Fax: [phone]  Clinic Day: 06/25/20   Referring physician: [name], MD   Chief Complaint: Katherine [name] is a 80 y.o. female with chronic lymphocytic leukemia (CLL) who is seen for 1 month assessment, and current treatment is acalabrutinib (Calquence) with poor tolerance.   HPI: The patient was last seen in the medical oncology clinic on 05/19/2020.  She had been diagnosed with C. difficile on 05/14/2020, GI panel by PCR was not collected. Her diarrhea had started to resolve. Stools were formed.  She required some IV fluids and electrolytes periodically.  Her acalabrutinib was held.\  She had an episode of severe abdominal cramping, with a pain level of 10 (on-going) towards the end of February for which she called EMS and was transported to the ER. She reports she was "taken for all these tests." She was unsatisfied with her ER visit at this time. She spent a total time of 8 hours in the ER. She refused admission secondary to improvement in abdominal pain. She was told to schedule an appointment with her PCP for a "follow up test" after her ER visit.    During the interim, she reports restarting acalabrutinib BID daily starting 06/11/20.  She continues to have abdominal pain.   She reports she cannot tolerate acalabrutinib.  She reports it is affecting her mentally, and physically (diarrhea). She reports that when she takes the medication, she "goes to the bathroom constantly."   Labs on 06/25/2020 include a continued elevated WBC, normal Magnesium level, and normal CMP.    Past Medical History:  Diagnosis Date  . Allergy   . Anxiety   . Depression   . GERD (gastroesophageal reflux disease)   . Hemorrhoids   . Hypertension   . Leukemia, lymphoid ([place])    CLL  . Lobar pneumonia ([place])   . Osteopenia   . Personal history of chemotherapy     Past Surgical  History:  Procedure Laterality Date  . ABDOMINAL HYSTERECTOMY    . APPENDECTOMY    . BREAST BIOPSY Left    bx x 3-neg  . COLON SURGERY     sigmoid resection  . COLONOSCOPY     2007, 2012  . COLONOSCOPY WITH PROPOFOL N/A 09/17/2015   Procedure: COLONOSCOPY WITH PROPOFOL;  Surgeon: Robert Bellow, MD;  Location: [facility] ENDOSCOPY;  Service: Endoscopy;  Laterality: N/A;  . OOPHORECTOMY    . SPINE SURGERY     L4-5    Family History  Problem Relation Age of Onset  . Osteoporosis Mother   . Hypertension Father   . Heart attack Father   . Stroke Maternal Grandfather   . Breast cancer Sister 24   Social History:  reports that she has been smoking cigarettes. She has a 35.00 pack-year smoking history. She has never used smokeless tobacco. She reports that she does not drink alcohol and does not use drugs. Currently smokes 5 cigarettes daily, but is working toward quitting. She previously smoked a pack a day for a "longtime".She lives in [place], [place]. Her daughter is Katherine [name].The patient is accompanied by Katherine [name] today.  Allergies:  Allergies  Allergen Reactions  . Augmentin [Amoxicillin-Pot Clavulanate] Swelling    tongue  . Biaxin [Clarithromycin] Swelling and Rash    tongue   Current Medications: Current Outpatient Medications  Medication Sig Dispense Refill  . acalabrutinib (CALQUENCE) 100 MG capsule TAKE 1 CAPSULE (100 MG TOTAL)  BY MOUTH DAILY. 30 capsule 1  . albuterol (VENTOLIN HFA) 108 (90 Base) MCG/ACT inhaler TAKE 2 PUFFS BY MOUTH EVERY 6 HOURS AS NEEDED FOR WHEEZE OR SHORTNESS OF BREATH 108 g 9  . atorvastatin (LIPITOR) 10 MG tablet Take 1 tablet (10 mg total) by mouth daily. 90 tablet 4  . benazepril (LOTENSIN) 40 MG tablet Take 1 tablet (40 mg total) by mouth daily. 90 tablet 4  . diphenhydrAMINE (BENADRYL) 25 MG tablet Take 25 mg by mouth at bedtime as needed.    Marland Kitchen LORazepam (ATIVAN) 1 MG tablet Take 0.5 tablets (0.5 mg total) by mouth 2 (two) times daily as needed for  anxiety. 30 tablet 4  . Multiple Vitamins-Minerals (MULTIVITAMIN WITH MINERALS) tablet Take 1 tablet by mouth daily.    Marland Kitchen PARoxetine (PAXIL) 30 MG tablet Take 1 tablet (30 mg total) by mouth daily. 90 tablet 4  . potassium chloride SA (KLOR-CON) 20 MEQ tablet Take 1 tablet (20 mEq total) by mouth daily. 10 tablet 0  . Triamcinolone Acetonide (NASACORT AQ NA) Place 2 Doses into the nose daily as needed.     . diphenoxylate-atropine (LOMOTIL) 2.5-0.025 MG tablet Take 1 tablet by mouth 4 (four) times daily as needed for diarrhea or loose stools. (Patient not taking: No sig reported) 90 tablet 0   No current facility-administered medications for this visit.    Review of Systems  Constitutional: Negative for chills, diaphoresis, fever, malaise/fatigue and weight loss (up 3 lbs).       Feels "so happy."  HENT: Positive for hearing loss (Left ear). Negative for congestion, ear discharge, ear pain, nosebleeds, sinus pain, sore throat and tinnitus.   Eyes: Negative for blurred vision.  Respiratory: Negative for cough, hemoptysis, sputum production and shortness of breath.   Cardiovascular: Positive for leg swelling. Negative for chest pain and palpitations.       Leg swelling "for most of her life" (approx. 52 years).   Gastrointestinal: Negative for abdominal pain, blood in stool, constipation, diarrhea (resolved), heartburn, melena, nausea and vomiting.       Diet not liberalized.  Genitourinary: Negative for dysuria, frequency, hematuria and urgency.  Musculoskeletal: Positive for back pain (lower back). Negative for joint pain, myalgias and neck pain.  Skin: Negative for itching and rash.  Neurological: Negative for dizziness, tingling, sensory change, weakness and headaches.  Endo/Heme/Allergies: Negative for environmental allergies. Does not bruise/bleed easily.  Psychiatric/Behavioral: Positive for depression. Negative for memory loss. The patient is not nervous/anxious and does not have  insomnia.        Reports sadness over how "this medication" is making me feel.  All other systems reviewed and are negative.  Performance status (ECOG): 1  Vitals Weight 66.2 kg.  Today's Vitals   06/25/20 1015  BP: (!) 125/59  Pulse: 84  Resp: 14  Temp: (!) 96.8 F (36 C)  SpO2: 93%  Weight: 66.2 kg  PainSc: 0-No pain   Body mass index is 24.29 kg/m.   Physical Exam Vitals and nursing note reviewed.  Constitutional:      General: She is not in acute distress.    Appearance: She is not diaphoretic.  HENT:     Head: Normocephalic and atraumatic.  Eyes:     General: No scleral icterus.    Conjunctiva/sclera: Conjunctivae normal.     Pupils: Pupils are equal, round, and reactive to light.     Comments: Glasses.   Cardiovascular:     Rate and Rhythm: Normal rate and  regular rhythm.  Pulmonary:     Effort: Pulmonary effort is normal. No respiratory distress.     Breath sounds: Normal breath sounds. No wheezing or rales.  Chest:     Chest wall: No mass, swelling, tenderness or edema.  Breasts: Breasts are symmetrical.     Right: Normal. No swelling, bleeding, inverted nipple, mass, nipple discharge, skin change, tenderness, axillary adenopathy or supraclavicular adenopathy.     Left: Normal. No swelling, bleeding, inverted nipple, mass, nipple discharge, skin change, tenderness, axillary adenopathy or supraclavicular adenopathy.      Comments: Breast exam preformed: Normal. Encouraged self breast examination. Patient to have mammogram in 2023.  Abdominal:     General: Bowel sounds are normal. There is no distension.     Palpations: Abdomen is soft. There is no hepatomegaly, splenomegaly or mass.     Tenderness: There is no abdominal tenderness. There is no guarding or rebound.  Musculoskeletal:        General: No swelling or tenderness. Normal range of motion.     Right lower leg: 4+ Edema present.     Left lower leg: 4+ Edema present.  Lymphadenopathy:     Head:      Right side of head: No preauricular, posterior auricular or occipital adenopathy.     Left side of head: No preauricular, posterior auricular or occipital adenopathy.     Upper Body:     Right upper body: No supraclavicular, axillary or pectoral adenopathy.     Left upper body: No supraclavicular, axillary or pectoral adenopathy.     Lower Body: No right inguinal adenopathy. No left inguinal adenopathy.  Skin:    General: Skin is warm and dry.  Neurological:     Mental Status: She is alert and oriented to person, place, and time.  Psychiatric:        Behavior: Behavior normal.        Thought Content: Thought content normal.        Judgment: Judgment normal.    No visits with results within 3 Day(s) from this visit.  Latest known visit with results is:  Appointment on 05/19/2020  Component Date Value Ref Range Status  . Sodium 05/19/2020 139  135 - 145 mmol/L Final  . Potassium 05/19/2020 3.9  3.5 - 5.1 mmol/L Final  . Chloride 05/19/2020 106  98 - 111 mmol/L Final  . CO2 05/19/2020 26  22 - 32 mmol/L Final  . Glucose, Bld 05/19/2020 108* 70 - 99 mg/dL Final   Glucose reference range applies only to samples taken after fasting for at least 8 hours.  . BUN 05/19/2020 <5* 8 - 23 mg/dL Final  . Creatinine, Ser 05/19/2020 0.93  0.44 - 1.00 mg/dL Final  . Calcium 05/19/2020 8.9  8.9 - 10.3 mg/dL Final  . GFR, Estimated 05/19/2020 >60  >60 mL/min Final   Comment: (NOTE) Calculated using the CKD-EPI Creatinine Equation (2021)   . Anion gap 05/19/2020 7  5 - 15 Final   Performed at [facility], [address], [place], [place] [address]  . WBC 05/19/2020 123.6* 4.0 - 10.5 K/uL Final   This critical result has verified and been called to [facility] by Earline Mayotte on 03 28 2022 at 1517, and has been read back.   Marland Kitchen RBC 05/19/2020 3.76* 3.87 - 5.11 MIL/uL Final  . Hemoglobin 05/19/2020 12.1  12.0 - 15.0 g/dL Final  . HCT 05/19/2020 38.4  36.0 - 46.0 % Final  .  MCV  05/19/2020 102.1* 80.0 - 100.0 fL Final  . MCH 05/19/2020 32.2  26.0 - 34.0 pg Final  . MCHC 05/19/2020 31.5  30.0 - 36.0 g/dL Final  . RDW 05/19/2020 14.4  11.5 - 15.5 % Final  . Platelets 05/19/2020 116* 150 - 400 K/uL Final  . nRBC 05/19/2020 0.0  0.0 - 0.2 % Final  . Neutrophils Relative % 05/19/2020 2  % Final  . Neutro Abs 05/19/2020 2.8  1.7 - 7.7 K/uL Final  . Lymphocytes Relative 05/19/2020 97  % Final  . Lymphs Abs 05/19/2020 119.0* 0.7 - 4.0 K/uL Final  . Monocytes Relative 05/19/2020 1  % Final  . Monocytes Absolute 05/19/2020 1.6* 0.1 - 1.0 K/uL Final  . Eosinophils Relative 05/19/2020 0  % Final  . Eosinophils Absolute 05/19/2020 0.1  0.0 - 0.5 K/uL Final  . Basophils Relative 05/19/2020 0  % Final  . Basophils Absolute 05/19/2020 0.1  0.0 - 0.1 K/uL Final  . WBC Morphology 05/19/2020 ABSOLUTE LYMPHOCYTOSIS   Final   SMUDGE CELLS  . RBC Morphology 05/19/2020 MORPHOLOGY UNREMARKABLE   Final  . Smear Review 05/19/2020 Normal platelet morphology   Final  . Immature Granulocytes 05/19/2020 0  % Final  . Abs Immature Granulocytes 05/19/2020 0.14* 0.00 - 0.07 K/uL Final   Performed at [facility], [address], [place], [place] [address]  . Campylobacter species 05/19/2020 NOT DETECTED  NOT DETECTED Final  . Plesimonas shigelloides 05/19/2020 NOT DETECTED  NOT DETECTED Final  . Salmonella species 05/19/2020 NOT DETECTED  NOT DETECTED Final  . Yersinia enterocolitica 05/19/2020 NOT DETECTED  NOT DETECTED Final  . Vibrio species 05/19/2020 NOT DETECTED  NOT DETECTED Final  . Vibrio cholerae 05/19/2020 NOT DETECTED  NOT DETECTED Final  . Enteroaggregative E coli (EAEC) 05/19/2020 NOT DETECTED  NOT DETECTED Final  . Enteropathogenic E coli (EPEC) 05/19/2020 NOT DETECTED  NOT DETECTED Final  . Enterotoxigenic E coli (ETEC) 05/19/2020 NOT DETECTED  NOT DETECTED Final  . Shiga like toxin producing E coli * 05/19/2020 NOT DETECTED  NOT DETECTED Final  .  Shigella/Enteroinvasive E coli (EI* 05/19/2020 NOT DETECTED  NOT DETECTED Final  . Cryptosporidium 05/19/2020 NOT DETECTED  NOT DETECTED Final  . Cyclospora cayetanensis 05/19/2020 NOT DETECTED  NOT DETECTED Final  . Entamoeba histolytica 05/19/2020 NOT DETECTED  NOT DETECTED Final  . Giardia lamblia 05/19/2020 NOT DETECTED  NOT DETECTED Final  . Adenovirus F40/41 05/19/2020 NOT DETECTED  NOT DETECTED Final  . Astrovirus 05/19/2020 NOT DETECTED  NOT DETECTED Final  . Norovirus GI/GII 05/19/2020 NOT DETECTED  NOT DETECTED Final  . Rotavirus A 05/19/2020 NOT DETECTED  NOT DETECTED Final  . Sapovirus (I, II, IV, and V) 05/19/2020 NOT DETECTED  NOT DETECTED Final   Performed at [facility], [address] [place], [place] [address]    Assessment:  Katherine [name] is a 80 y.o. female with chronic lymphocytic leukemia(CLL). WBC has ranged between 22,000 - 101,7000 since 05/2011.  She has received Rituxanx 2 four week cycles (06/27/2015 and 05/27/2017). Hepatitis B surface antigen and hepatitis B surface antibody were negative on 07/04/2015.  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  CCND1, ATM, chromosome 12, and TP53 were normal.   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  There was a CD5 and CD23 positive monoclonal B cell population with lambda light chain restriction,  negative for FMC7 and CD38, representing  88% of leukocytes, >5,000/uL. There was no loss of, or aberrant expression of, the pan T cell  antigens to  suggest a neoplastic T cell process. CD4:CD8 ratio 2.0  No circulating blasts were detected. There was no immunophenotypic evidence of abnormal myeloid maturation.  IGH/BCL2 by FISH revealed 86.5% of nuclei positive for 3 IgH signals.  There were 2BCL2 signals. The IGH/BCL2 fusion signals associated with follicular lymphoma, and  to a lesser extent diffuse large cell  lymphoma, were not observed. The presence of 3 IGH  signals suggests an IGH rearrangement with a gene other than BCL2.  She received ibrutinib from 04/24/2019 - 05/30/2019.  She stopped ibrutinib secondary to hematuria  She began acalabrutinib on 03/14/2020. Acalabrutinob has been on hold since 05/13/2020.  WBC has been followed: 107,200 on 04/24/2019, 206,800 on 04/30/2019, 184,700 on 05/07/2019, 209,500 on 05/15/2019, 237,700 on 05/21/2019, 256,400 on 06/01/2019, 220,400 on 06/04/2019, 81,400 on 06/19/2019, 69,500 on 07/18/2019, 80,500 on 08/15/2019, 95,500 on 09/12/2019, 107,500 on 10/11/2019, 83,10 on 11/08/2019, 104,700 on 12/06/2019, 116,200 on 02/06/2020, 144,300 on 03/13/2020, 326,900 on 03/31/2020, 325,600 on 04/07/2020, 316,800 on 04/14/2020, 322,300 on 04/20/2020, 232,300 on 05/13/2020, 156,700 on 05/16/2020, and 123,600 on 05/19/2020.  She developed hematuria. CT hematuria work up on 06/01/2019 was negative.  There was no retroperitoneal or periportal lymphadenopathy. Cystoscopy on 06/13/2019 was normal.  Her last episode of gross hematuria was on 06/02/2019. Urinalysis on 06/07/2019 showed leukocytes 1+ and trace amounts of ketones and RBC. Urine culture revealed mixed urogenital flora.   She has a 35 pack year smoking history. She is in the low dose chest CT program. Low dose chest CTon 02/02/2018 revealed a new endobronchial lesionin the segmental bronchus to the medial segment of the right middle lobe. This may simply reflect an area of retained secretions, however, the possibility of an endobronchial neoplasm should be considered. Lung-RADS 4AS, suspicious.Low dose chest CTon 05/13/2020revealed Lung-RADS 2, benign appearance or behavior. Prior right middle lobe endobronchial lesion/mucous plugging wasno longer visualized. New mucous plugging in the right lower lobe.  Low dose chest CT on 10/11/2019 revealed complete atelectasis in the left  lower lobe.  There was no pathologically enlarged mediastinal or hilar lymph nodes.  There was no axillary adenopathy.  There was mild diffuse bronchial wall thickening with mild centrilobular and paraseptal emphysema; imaging findings suggestive of underlying COPD.   Low dose chest CT on 10/11/2019 revealed Lung-RADS 0S, incomplete. Study was limited by complete atelectasis in the left lower lobe.  There was no pathologically enlarged mediastinal or hilar lymph nodes.  There was no axillary adenopathy.  There was aortic atherosclerosis, in addition to 3 vessel coronary artery disease. There was mild diffuse bronchial wall thickening with mild centrilobular and paraseptal emphysema; imaging findings suggestive of underlying COPD. Chest CT without contrast on 01/09/2020 revealed interval re-expansion of the left lower lobe.  There was a stable 3 mm left lower lobe pulmonary nodule, benign based on long-term stability. If the patient continues to meet screening eligibility criteria, repeat low-dose lung cancer screening CT in 1 year could be performed.  There was splenomegaly (partially visualized).  There was aortic atherosclerosis and emphysema.  She received her first dose of the [place] COVID-19 vaccine on 05/11/2019.  Symptomatically, she is doing well.  Diarrhea has resolved.  Plan: 1.   Labs today: CBC with diff, CMP, Mag.  2. Chronic lymphocytic leukemia (CLL)  Clinically,  she is doing well.  WBC continue to be  elevated/critical at 126.4 today.  She has Rai stage II. 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   CCND1, ATM, chromosome 12, and TP53 were normal  She began ibrutinib on 04/24/2019 (discontinued on 05/30/2019).         She developed blood-streaked sputum (slight) then significant hematuria.         She declined further ibrutinib.         She began acalabrutinib on 03/14/2020  (held on 05/13/2020 secondary to diarrhea).  WBC has decreased to 123,600.  Diarrhea associated with acalabrutinib is 18-35%.  Acalabrutinib was held for 1-2 weeks  She restarted acalabrutinib 100 mg on 06/11/2020 with significant abdominal pain.  Feels that she cannot tolerate the medication.  Would like to speak to an oncologist about other options.  3. Health maintenance Patient is on the low-dose chest CT program.           Chest CT  without contrast on 01/09/2020 revealed interval re-expansion of the left lower lobe.                                -There was a stable 3 mm left lower lobe pulmonary nodule.       Chest CT planned for 01/08/2021. Bilateral mammogram to be completed Biannually in average risk individuals (in 2023), per USPSFT.    -Patient has 1 sister with breast cancer (diagnosed in 2015).   -Patient denies history of abnormal mammograms.    -Encouraged self breast examination monthly.  Anticipate f/u 2 weeks after Calquence re-started.    Dispostion:  Hold acalabrutinib at this time.  RTC in 2 weeks for MD assessment (Dr. Rogue Bussing), and to discuss other treatment options for CLL secondary to poor tolerance.   The patient's diagnosis, an outline of the further diagnostic and laboratory studies which will be required, the recommendation for surgery, and alternatives were discussed with her and her accompanying family members.  All questions were answered to their satisfaction.  I personally had a face to fa calcium is 9.6 she is tolerating Prolia well twice a year with no issues takes 1200 mg/day because she had scans once a year given disease 1719 [id number].  We are Ms. Valere Dross on ce interaction and evaluated the patient jointly with the NP Student, Mrs. Benedetto Goad.  I have reviewed her history and available records and have performed the key portions of the physical exam including general, HEENT, abdominal exam, pelvic exam with my findings  confirming those documented above by the APP student.  I have discussed the case with the APP student and the patient.  I agree with the above documentation, assessment and plan which was fully formulated by me.  Counseling was completed by me.    Benedetto Goad, Student FNP  Greater than 50% was spent in counseling and coordination of care with this patient including but not limited to discussion of the relevant topics above (See A&P) including, but not limited to diagnosis and management of acute and chronic medical conditions.   Faythe Casa, NP 06/25/2020 1:20 PM

## 2020-06-25 NOTE — Progress Notes (Signed)
Pt states CALQUENCE is giving her lots of side effects; emotional, physical, and mental. As well as, frequent diarrhea. Says she took two a day for a month and could not handle it, she switched over to taking it every other day. Pt is very disappointed in the ER visit back in February.

## 2020-07-07 ENCOUNTER — Telehealth: Payer: Self-pay

## 2020-07-07 ENCOUNTER — Other Ambulatory Visit: Payer: Self-pay | Admitting: Nurse Practitioner

## 2020-07-07 DIAGNOSIS — F3342 Major depressive disorder, recurrent, in full remission: Secondary | ICD-10-CM

## 2020-07-07 NOTE — Telephone Encounter (Signed)
Scheduled 6/15

## 2020-07-07 NOTE — Telephone Encounter (Signed)
Will sign and send

## 2020-07-07 NOTE — Telephone Encounter (Signed)
Received new Rx request for spacer for albuterol inhaler to be sent over to [facility] pharmacy in [place].

## 2020-07-07 NOTE — Telephone Encounter (Signed)
Requested medication (s) are due for refill today: yes  Requested medication (s) are on the active medication list: yes  Last refill:  06/14/20  Future visit scheduled: yes  Notes to clinic:  not delegated    Requested Prescriptions  Pending Prescriptions Disp Refills   LORazepam (ATIVAN) 1 MG tablet [Pharmacy Med Name: LORAZEPAM 1 MG TABLET] 30 tablet 4    Sig: Take 0.5 tablets (0.5 mg total) by mouth 2 (two) times daily as needed for anxiety.      Not Delegated - Psychiatry:  Anxiolytics/Hypnotics Failed - 07/07/2020  8:14 AM      Failed - This refill cannot be delegated      Passed - Urine Drug Screen completed in last 360 days      Passed - Valid encounter within last 6 months    Recent Outpatient Visits           5 months ago CLL (chronic lymphocytic leukemia) ([place])   [facility] [place], Henrine Screws T, NP   8 months ago Other emphysema [facility])   [facility], [place]   12 months ago Essential hypertension   [facility] [name] [place], [place]   1 year ago [place], [place], DO   1 year ago Essential hypertension   [place], Rachel [place], [place]       Future Appointments             In 1 month [place], Barbaraann Faster, NP [employer], [facility]

## 2020-07-08 ENCOUNTER — Other Ambulatory Visit: Payer: Self-pay | Admitting: Nurse Practitioner

## 2020-07-08 MED ORDER — AEROCHAMBER PLUS FLO-VU MEDIUM MISC: 1.0000 | Freq: Once | 0 refills | Status: AC

## 2020-07-08 MED ORDER — ALBUTEROL SULFATE HFA 108 (90 BASE) MCG/ACT IN AERS: INHALATION_SPRAY | RESPIRATORY_TRACT | 9 refills | Status: DC

## 2020-07-08 NOTE — Telephone Encounter (Signed)
There was no area for provider signature. They were requesting a new Rx electronically to be sent to pharmacy.

## 2020-07-08 NOTE — Telephone Encounter (Signed)
Sent this in

## 2020-07-18 ENCOUNTER — Ambulatory Visit: Payer: Medicare HMO | Admitting: Internal Medicine

## 2020-07-25 ENCOUNTER — Inpatient Hospital Stay: Payer: Medicare HMO

## 2020-07-25 ENCOUNTER — Inpatient Hospital Stay: Payer: Medicare HMO | Attending: Internal Medicine | Admitting: Internal Medicine

## 2020-07-25 ENCOUNTER — Other Ambulatory Visit: Payer: Self-pay

## 2020-07-25 DIAGNOSIS — Z803 Family history of malignant neoplasm of breast: Secondary | ICD-10-CM | POA: Diagnosis not present

## 2020-07-25 DIAGNOSIS — R69 Illness, unspecified: Secondary | ICD-10-CM | POA: Diagnosis not present

## 2020-07-25 DIAGNOSIS — C9110 Chronic lymphocytic leukemia of B-cell type not having achieved remission: Secondary | ICD-10-CM | POA: Diagnosis not present

## 2020-07-25 DIAGNOSIS — F17210 Nicotine dependence, cigarettes, uncomplicated: Secondary | ICD-10-CM | POA: Diagnosis not present

## 2020-07-25 LAB — COMPREHENSIVE METABOLIC PANEL
ALT: 13 U/L (ref 0–44)
AST: 20 U/L (ref 15–41)
Albumin: 4.6 g/dL (ref 3.5–5.0)
Alkaline Phosphatase: 78 U/L (ref 38–126)
Anion gap: 8 (ref 5–15)
BUN: 10 mg/dL (ref 8–23)
CO2: 25 mmol/L (ref 22–32)
Calcium: 9 mg/dL (ref 8.9–10.3)
Chloride: 104 mmol/L (ref 98–111)
Creatinine, Ser: 0.8 mg/dL (ref 0.44–1.00)
GFR, Estimated: >60 mL/min (ref >60)
Glucose, Bld: 103 mg/dL — ABNORMAL HIGH (ref 70–99)
Potassium: 4.2 mmol/L (ref 3.5–5.1)
Sodium: 137 mmol/L (ref 135–145)
Total Bilirubin: 1 mg/dL (ref 0.3–1.2)
Total Protein: 6.4 g/dL — ABNORMAL LOW (ref 6.5–8.1)

## 2020-07-25 LAB — CBC WITH DIFFERENTIAL/PLATELET
Abs Immature Granulocytes: 0.05 10*3/uL (ref 0.00–0.07)
Basophils Absolute: 0.2 10*3/uL — ABNORMAL HIGH (ref 0.0–0.1)
Basophils Relative: 0 %
Eosinophils Absolute: 0.1 10*3/uL (ref 0.0–0.5)
Eosinophils Relative: 0 %
HCT: 43.6 % (ref 36.0–46.0)
Hemoglobin: 14.3 g/dL (ref 12.0–15.0)
Immature Granulocytes: 0 %
Lymphocytes Relative: 94 %
Lymphs Abs: 56.2 10*3/uL — ABNORMAL HIGH (ref 0.7–4.0)
MCH: 31.8 pg (ref 26.0–34.0)
MCHC: 32.8 g/dL (ref 30.0–36.0)
MCV: 96.9 fL (ref 80.0–100.0)
Monocytes Absolute: 1.3 10*3/uL — ABNORMAL HIGH (ref 0.1–1.0)
Monocytes Relative: 2 %
Neutro Abs: 2.1 10*3/uL (ref 1.7–7.7)
Neutrophils Relative %: 4 %
Platelets: 106 10*3/uL — ABNORMAL LOW (ref 150–400)
RBC: 4.5 MIL/uL (ref 3.87–5.11)
RDW: 14 % (ref 11.5–15.5)
WBC: 60 10*3/uL (ref 4.0–10.5)
nRBC: 0 % (ref 0.0–0.2)

## 2020-07-25 LAB — LACTATE DEHYDROGENASE: LDH: 133 U/L (ref 98–192)

## 2020-07-25 NOTE — Progress Notes (Signed)
[place] NOTE  Patient Care Team: [name], MD as PCP - General (Family Medicine) [name], MD as PCP - Family Medicine (Family Medicine) Oneta Rack, MD (Dermatology) Manya Silvas, MD (Inactive) (Gastroenterology) Bary Castilla Forest Gleason, MD (General Surgery) Lequita Asal, MD as Referring Physician (Hematology and Oncology)  CHIEF COMPLAINTS/PURPOSE OF CONSULTATION:  CLL  #  Oncology History Overview Note  Katherine [name] is a 80 y.o. female with chronic lymphocytic leukemia (CLL).  WBC has ranged between 22,000 - 101,7000 since 05/2011.   She has received Rituxan x 2 four week cycles (06/27/2015 and 05/27/2017).  Hepatitis B surface antigen and hepatitis B surface antibody were negative on 07/04/2015.   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  CCND1, ATM, chromosome 12, and TP53 were normal.     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  There was a CD5 and CD23 positive monoclonal B cell population with lambda light chain restriction, negative for FMC7 and CD38, representing  88% of leukocytes, >5,000/uL. There was no loss of, or aberrant expression of, the pan T cell  antigens to  suggest a neoplastic T cell process. CD4:CD8 ratio 2.0  No circulating blasts were detected. There was no immunophenotypic evidence of abnormal myeloid maturation.  IGH/BCL2 by FISH is pending. --------------------------------------------------------  She began ibrutinib on 04/24/2019 (discontinued on 05/30/2019).                     She developed blood-streaked sputum (slight) then significant hematuria.                     She declined further ibrutinib. ---------------------------------------------------------------------------------            She began acalabrutinib on 03/14/2020 (held on 05/13/2020 secondary  to diarrhea). --------------------------------------------------------------------------------------   She has a 35 pack year smoking history.  She is in the low dose chest CT program.  Low dose chest CT on 02/02/2018 revealed a new endobronchial lesion in the segmental bronchus to the medial segment of the right middle lobe. This may simply reflect an area of retained secretions, however, the possibility of an endobronchial neoplasm should be considered. Lung-RADS 4AS, suspicious.  Low dose chest CT on 07/05/2018 revealed Lung-RADS 2, benign appearance or behavior.  Prior right middle lobe endobronchial lesion/mucous plugging was no longer visualized.  New mucous plugging in the right lower lobe.   CLL (chronic lymphocytic leukemia) ([place])  05/28/2015 Initial Diagnosis   CLL (chronic lymphocytic leukemia) ([facility])      HISTORY OF PRESENTING ILLNESS:  Katherine [name] 80 y.o.  female CLL-13 q. Deletion currently off any therapy is here for follow-up.  Most recently patient was on acalbrutinib-however discontinued because of severe diarrhea.   Patient states that she is feeling much better off any treatment.  Complains of chronic fatigue but denies any new lumps or bumps.  Appetite is fair.  No weight loss.  No night sweats.   Review of Systems  Constitutional: Positive for malaise/fatigue. Negative for chills, diaphoresis, fever and weight loss.  HENT: Negative for nosebleeds and sore throat.   Eyes: Negative for double vision.  Respiratory: Negative for cough, hemoptysis, sputum production, shortness of breath and wheezing.   Cardiovascular: Negative for chest pain, palpitations, orthopnea and leg swelling.  Gastrointestinal: Negative for abdominal pain, blood in stool, constipation,  diarrhea, heartburn, melena, nausea and vomiting.  Genitourinary: Negative for dysuria, frequency and urgency.  Musculoskeletal: Positive for back pain. Negative for joint pain.  Skin: Negative.  Negative for  itching and rash.  Neurological: Negative for dizziness, tingling, focal weakness, weakness and headaches.  Endo/Heme/Allergies: Does not bruise/bleed easily.  Psychiatric/Behavioral: Negative for depression. The patient is not nervous/anxious and does not have insomnia.      MEDICAL HISTORY:  Past Medical History:  Diagnosis Date  . Allergy   . Anxiety   . Depression   . GERD (gastroesophageal reflux disease)   . Hemorrhoids   . Hypertension   . Leukemia, lymphoid ([place])    CLL  . Lobar pneumonia ([place])   . Osteopenia   . Personal history of chemotherapy     SURGICAL HISTORY: Past Surgical History:  Procedure Laterality Date  . ABDOMINAL HYSTERECTOMY    . APPENDECTOMY    . BREAST BIOPSY Left    bx x 3-neg  . COLON SURGERY     sigmoid resection  . COLONOSCOPY     2007, 2012  . COLONOSCOPY WITH PROPOFOL N/A 09/17/2015   Procedure: COLONOSCOPY WITH PROPOFOL;  Surgeon: Robert Bellow, MD;  Location: [facility] ENDOSCOPY;  Service: Endoscopy;  Laterality: N/A;  . OOPHORECTOMY    . SPINE SURGERY     L4-5    SOCIAL HISTORY: Social History   Socioeconomic History  . Marital status: Married    Spouse name: Not on file  . Number of children: Not on file  . Years of education: 16  . Highest education level: 12th grade  Occupational History  . Not on file  Tobacco Use  . Smoking status: Current Every Day Smoker    Packs/day: 1.00    Years: 35.00    Pack years: 35.00    Types: Cigarettes  . Smokeless tobacco: Never Used  . Tobacco comment: 5 cigarettes daily--01/22/2020  Vaping Use  . Vaping Use: Never used  Substance and Sexual Activity  . Alcohol use: No    Alcohol/week: 0.0 standard drinks  . Drug use: No  . Sexual activity: Not on file  Other Topics Concern  . Not on file  Social History Narrative  . Not on file   Social Determinants of Health   Financial Resource Strain: Not on file  Food Insecurity: Not on file  Transportation Needs: Not on file   Physical Activity: Not on file  Stress: Not on file  Social Connections: Not on file  Intimate Partner Violence: Not on file    FAMILY HISTORY: Family History  Problem Relation Age of Onset  . Osteoporosis Mother   . Hypertension Father   . Heart attack Father   . Stroke Maternal Grandfather   . Breast cancer Sister 31    ALLERGIES:  is allergic to augmentin [amoxicillin-pot clavulanate] and biaxin [clarithromycin].  MEDICATIONS:  Current Outpatient Medications  Medication Sig Dispense Refill  . albuterol (VENTOLIN HFA) 108 (90 Base) MCG/ACT inhaler TAKE 2 PUFFS BY MOUTH EVERY 6 HOURS AS NEEDED FOR WHEEZE OR SHORTNESS OF BREATH 108 g 9  . atorvastatin (LIPITOR) 10 MG tablet Take 1 tablet (10 mg total) by mouth daily. 90 tablet 4  . benazepril (LOTENSIN) 40 MG tablet Take 1 tablet (40 mg total) by mouth daily. 90 tablet 4  . diphenhydrAMINE (BENADRYL) 25 MG tablet Take 25 mg by mouth at bedtime as needed.    Marland Kitchen LORazepam (ATIVAN) 1 MG tablet Take 0.5 tablets (0.5 mg total) by mouth  2 (two) times daily as needed for anxiety. 60 tablet 0  . Multiple Vitamins-Minerals (MULTIVITAMIN WITH MINERALS) tablet Take 1 tablet by mouth daily.    Marland Kitchen PARoxetine (PAXIL) 30 MG tablet Take 1 tablet (30 mg total) by mouth daily. 90 tablet 4  . acalabrutinib (CALQUENCE) 100 MG capsule TAKE 1 CAPSULE (100 MG TOTAL) BY MOUTH DAILY. (Patient not taking: Reported on 07/25/2020) 30 capsule 1  . diphenoxylate-atropine (LOMOTIL) 2.5-0.025 MG tablet Take 1 tablet by mouth 4 (four) times daily as needed for diarrhea or loose stools. (Patient not taking: No sig reported) 90 tablet 0  . potassium chloride SA (KLOR-CON) 20 MEQ tablet Take 1 tablet (20 mEq total) by mouth daily. (Patient not taking: Reported on 07/25/2020) 10 tablet 0   No current facility-administered medications for this visit.      Marland Kitchen  PHYSICAL EXAMINATION: ECOG PERFORMANCE STATUS: 1 - Symptomatic but completely ambulatory  Vitals:   07/25/20  1110  BP: 134/73  Pulse: 92  Resp: 20  Temp: (!) 97.3 F (36.3 C)  SpO2: 94%   Filed Weights   07/25/20 1110  Weight: 141 lb 3.3 oz (64 kg)    Physical Exam Constitutional:      Comments: Frail-appearing Caucasian female patient.  Accompanied by daughter  HENT:     Head: Normocephalic and atraumatic.     Mouth/Throat:     Pharynx: No oropharyngeal exudate.  Eyes:     Pupils: Pupils are equal, round, and reactive to light.  Cardiovascular:     Rate and Rhythm: Normal rate and regular rhythm.  Pulmonary:     Effort: No respiratory distress.     Breath sounds: No wheezing.     Comments: Decreased air entry bilaterally at the bases. Abdominal:     General: Bowel sounds are normal. There is no distension.     Palpations: Abdomen is soft. There is no mass.     Tenderness: There is no abdominal tenderness. There is no guarding or rebound.  Musculoskeletal:        General: No tenderness. Normal range of motion.     Cervical back: Normal range of motion and neck supple.  Skin:    General: Skin is warm.  Neurological:     Mental Status: She is alert and oriented to person, place, and time.  Psychiatric:        Mood and Affect: Affect normal.      LABORATORY DATA:  I have reviewed the data as listed Lab Results  Component Value Date   WBC 60.0 (HH) 07/25/2020   HGB 14.3 07/25/2020   HCT 43.6 07/25/2020   MCV 96.9 07/25/2020   PLT 106 (L) 07/25/2020   Recent Labs    08/15/19 1055 08/15/19 1055 09/12/19 1021 10/11/19 1032 12/06/19 1002 05/13/20 1007 05/14/20 0929 05/19/20 1415 06/25/20 1015 07/25/20 1155  NA 140  --  140 142   < > 138   < > 139 136 137  K 4.2  --  4.4 4.3   < > 3.4*   < > 3.9 4.2 4.2  CL 103  --  103 106   < > 104   < > 106 101 104  CO2 28  --  27 26   < > 25   < > _0 GLUCOSE 100*  --  100* 104*   < > 119*   < > 108* 92 103*  BUN 11  --  13 11   < >  11   < > <5* 9 10  CREATININE 0.83  --  0.94 0.93   < > 1.20*   < > 0.93 0.72 0.80   CALCIUM 9.4  --  9.4 9.0   < > 9.6   < > 8.9 9.2 9.0  GFRNONAA >60  --  58* 59*   < > 46*   < > >60 >60 >60  GFRAA >60  --  >60 >60  --   --   --   --   --   --   PROT  --    < > 6.8 6.8   < > 6.7  --   --  6.5 6.4*  ALBUMIN  --    < > 4.7 4.5   < > 4.6  --   --  4.6 4.6  AST  --    < > 23 22   < > 17  --   --  22 20  ALT  --    < > 15 13   < > 10  --   --  11 13  ALKPHOS  --    < > 87 96   < > 72  --   --  98 78  BILITOT  --    < > 1.0 0.8   < > 0.7  --   --  1.0 1.0   < > = values in this interval not displayed.    RADIOGRAPHIC STUDIES: I have personally reviewed the radiological images as listed and agreed with the findings in the report. No results found.  ASSESSMENT & PLAN:   CLL (chronic lymphocytic leukemia) ([place]) #CLL-Rai stage II; FISH 13 q. Deletion-poor tolerance to Ibrutinib; and Acalburtinib [stopped in March 2022 because of diarrhea].   #Patient feels significantly improved in terms of side effects [no significant fatigue; diarrhea; bruising]-since being off TKIs.   #Smoker/lung cancer screening program-CT November 2021-3 mm LLL.  Monitor  #I long discussion with the patient/2 daughters regarding her significant intolerance to therapies for CLL.  Patient is clinically stable I think is reasonable to continue surveillance off any therapy.  If patient has signs and symptoms of progression-discussed regarding use IV therapies including rituximab [pt pref]; Gazyva Versus others.  # DISPOSITION: will [phone]/[id number] pt #today labs- cbc/cmp; LDH # Follow up in 2 months [Tuesday]-; MD; labs- cbc/CMP;[facility]-Dr.B   All questions were answered. The patient knows to call the clinic with any problems, questions or concerns.   Cammie Sickle, MD 07/27/2020 6:16 PM

## 2020-07-25 NOTE — Assessment & Plan Note (Addendum)
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 Deletion-poor tolerance to Ibrutinib; and Acalburtinib [stopped in March 2022 because of diarrhea].   #Patient feels significantly improved in terms of side effects [no significant fatigue; diarrhea; bruising]-since being off TKIs.   #Smoker/lung cancer screening program-CT November 2021-3 mm LLL.  Monitor  #I long discussion with the patient/2 daughters regarding her significant intolerance to therapies for CLL.  Patient is clinically stable I think is reasonable to continue surveillance off any therapy.  If patient has signs and symptoms of progression-discussed regarding use IV therapies including rituximab [pt pref]; Gazyva Versus others.  # DISPOSITION: will [phone]/[id number] pt #today labs- cbc/cmp; LDH # Follow up in 2 months [Tuesday]-; MD; labs- cbc/CMP;[facility]-Dr.B

## 2020-07-27 ENCOUNTER — Encounter: Payer: Self-pay | Admitting: Hematology and Oncology

## 2020-07-28 ENCOUNTER — Other Ambulatory Visit (HOSPITAL_COMMUNITY): Payer: Self-pay

## 2020-07-28 ENCOUNTER — Telehealth: Payer: Self-pay | Admitting: Internal Medicine

## 2020-07-28 NOTE — Telephone Encounter (Signed)
6/06-left voicemail for the patient that her labs look improved/stable.  Recommend continued surveillance at this time without any treatment.  Recommend call if any questions.

## 2020-08-06 ENCOUNTER — Encounter: Payer: Self-pay | Admitting: Nurse Practitioner

## 2020-08-06 ENCOUNTER — Other Ambulatory Visit: Payer: Self-pay

## 2020-08-06 ENCOUNTER — Ambulatory Visit (INDEPENDENT_AMBULATORY_CARE_PROVIDER_SITE_OTHER): Payer: Medicare HMO | Admitting: Nurse Practitioner

## 2020-08-06 VITALS — BP 105/66 | HR 85 | Temp 98.4°F | Wt 140.6 lb

## 2020-08-06 DIAGNOSIS — E7800 Pure hypercholesterolemia, unspecified: Secondary | ICD-10-CM | POA: Diagnosis not present

## 2020-08-06 DIAGNOSIS — Z79899 Other long term (current) drug therapy: Secondary | ICD-10-CM | POA: Diagnosis not present

## 2020-08-06 DIAGNOSIS — I10 Essential (primary) hypertension: Secondary | ICD-10-CM | POA: Diagnosis not present

## 2020-08-06 DIAGNOSIS — F339 Major depressive disorder, recurrent, unspecified: Secondary | ICD-10-CM | POA: Diagnosis not present

## 2020-08-06 DIAGNOSIS — J432 Centrilobular emphysema: Secondary | ICD-10-CM

## 2020-08-06 DIAGNOSIS — F17210 Nicotine dependence, cigarettes, uncomplicated: Secondary | ICD-10-CM

## 2020-08-06 DIAGNOSIS — D696 Thrombocytopenia, unspecified: Secondary | ICD-10-CM

## 2020-08-06 DIAGNOSIS — D692 Other nonthrombocytopenic purpura: Secondary | ICD-10-CM | POA: Diagnosis not present

## 2020-08-06 DIAGNOSIS — Z1211 Encounter for screening for malignant neoplasm of colon: Secondary | ICD-10-CM

## 2020-08-06 DIAGNOSIS — I700 Atherosclerosis of aorta: Secondary | ICD-10-CM

## 2020-08-06 DIAGNOSIS — F419 Anxiety disorder, unspecified: Secondary | ICD-10-CM

## 2020-08-06 DIAGNOSIS — C9110 Chronic lymphocytic leukemia of B-cell type not having achieved remission: Secondary | ICD-10-CM | POA: Diagnosis not present

## 2020-08-06 DIAGNOSIS — R69 Illness, unspecified: Secondary | ICD-10-CM | POA: Diagnosis not present

## 2020-08-06 MED ORDER — LORAZEPAM 1 MG PO TABS: 0.5000 mg | ORAL_TABLET | Freq: Two times a day (BID) | ORAL | 2 refills | Status: DC | PRN

## 2020-08-06 NOTE — Assessment & Plan Note (Signed)
Chronic, ongoing.  Continue current medication regimen and collaboration with pulmonary as needed.  Recommend she avoid use of Benadryl due to age >60 and BEERS criteria, utilize Claritin instead.  Return to office in 6 months.

## 2020-08-06 NOTE — Assessment & Plan Note (Signed)
Chronic, ongoing with BP at goal today for age.  Continue current medication regimen and adjust as needed.  Recommend she monitor BP at least a few mornings a week at home and document.  DASH diet at home.  CMP up to date with oncology, reviewed.  Return in 6 months.

## 2020-08-06 NOTE — Progress Notes (Signed)
BP 105/66   Pulse 85   Temp 98.4 F (36.9 C) (Oral)   Wt 140 lb 9.6 oz (63.8 kg)   SpO2 92%   BMI 23.40 kg/m    Subjective:    Patient ID: Katherine [name], female    DOB: 1940/11/13, 80 y.o.   MRN: [id number]  HPI: Katherine [name] is a 80 y.o. female  Chief Complaint  Patient presents with   Mood   Ear Fullness    Patient states she can not hear good out of her left ear. Patient denies having any pain or ringing in her ear. Patient states it has been several months and doesn't know if it has come from a medication. As she has had a few issues with a medication.    COPD Continues on Albuterol only at this time, tried Breo in past which caused side effects.  Is followed by oncology for CLL, with last visit 07/25/20 -- is now off treatment and feeling better -- had major side effects with recent treatment.  She is also followed by pulmonary and last saw Dr. Patsey Berthold on 01/22/20. Has annual lung screening CT, with recent 01/09/2020 noting aortic atherosclerosis and emphysema.  She is a current smoker -- smokes 7 cigarettes a day, has smoked since she was younger, not interested in quitting. COPD status: stable Satisfied with current treatment?: yes Oxygen use: no Dyspnea frequency: none Cough frequency: none Rescue inhaler frequency: 1-2 times a day Limitation of activity: no Productive cough: none Last Spirometry: with pulmonary Pneumovax: Up to Date Influenza: Up to Date  EAR FULLNESS (LEFT) Has noticed for months since being on the recent chemo medication.  To left ear. Duration: months Involved ear(s): right Severity:  mild  Fever: no Otorrhea: no Upper respiratory infection symptoms: no Pruritus: no Hearing loss: yes Water immersion no Using Q-tips: no Recurrent otitis media: no Status: stable Treatments attempted: none    HYPERTENSION / HYPERLIPIDEMIA Continues on Benazepril and Atorvastatin. Satisfied with current treatment? yes Duration of  hypertension: chronic BP monitoring frequency: not checking BP range: BP medication side effects: no Duration of hyperlipidemia: chronic Cholesterol medication side effects: no Cholesterol supplements: none Medication compliance: good compliance Aspirin: no Recent stressors: no Recurrent headaches: no Visual changes: no Palpitations: no Dyspnea: no Chest pain: no Lower extremity edema: no Dizzy/lightheaded: no   ANXIETY/STRESS Continues on Paxil 30 MG daily + Ativan 0.5 MG BID PRN -- often takes twice a day.  Pt is aware of risks of benzo medication use to include increased sedation, respiratory suppression, falls, dependence and cardiovascular events.  Pt would like to continue treatment as benefit determined to outweigh risk.  PDMP review last filled 07/11/20.  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   Duration:controlled Anxious mood: yes, in afternoon on occasion Excessive worrying: yes, occasional Irritability: no  Sweating: no Nausea: no Palpitations:no Hyperventilation: no Panic attacks: no Agoraphobia: no  Obscessions/compulsions: no Depressed mood: no Depression screen [facility] 2/9 08/06/2020 02/05/2020 07/11/2019 01/11/2019 07/11/2018  Decreased Interest 0 3 - 1 0  Down, Depressed, Hopeless 0 0 - 1 0  PHQ - 2 Score 0 3 - 2 0  Altered sleeping 0 0 0 0 0  Tired, decreased energy 0 3 1 2 1   Change in appetite 0 0 0 0 0  Feeling bad or failure about yourself  0 0 0 0 0  Trouble concentrating 0 0 0 0  1  Moving slowly or fidgety/restless 0 0 0 0 1  Suicidal thoughts 0 0 0 0 0  PHQ-9 Score 0 6 - 4 3  Difficult doing work/chores - - - Somewhat difficult Not difficult at all  Some recent data might be hidden   Anhedonia: no Weight changes: no Insomnia: none Hypersomnia: no Fatigue/loss of energy: no Feelings of worthlessness: no Feelings of guilt: no Impaired concentration/indecisiveness: no Suicidal  ideations: no  Crying spells: no Recent Stressors/Life Changes: no   Relationship problems: no   Family stress: no     Financial stress: no    Job stress: no    Recent death/loss: no GAD 7 : Generalized Anxiety Score 08/06/2020 07/11/2019 07/11/2018 01/09/2018  Nervous, Anxious, on Edge 0 0 2 0  Control/stop worrying 0 0 1 0  Worry too much - different things 0 0 1 0  Trouble relaxing 0 0 0 0  Restless 0 - 2 0  Easily annoyed or irritable 0 - 1 0  Afraid - awful might happen 0 - 0 0  Total GAD 7 Score 0 - 7 0  Anxiety Difficulty Not difficult at all - Not difficult at all -      Relevant past medical, surgical, family and social history reviewed and updated as indicated. Interim medical history since our last visit reviewed. Allergies and medications reviewed and updated.  Review of Systems  Constitutional:  Negative for activity change, appetite change, diaphoresis, fatigue and fever.  HENT:  Negative for ear discharge and ear pain.   Respiratory:  Negative for cough, chest tightness and shortness of breath.   Cardiovascular:  Negative for chest pain, palpitations and leg swelling.  Gastrointestinal: Negative.   Neurological: Negative.   Psychiatric/Behavioral: Negative.     Per HPI unless specifically indicated above     Objective:    BP 105/66   Pulse 85   Temp 98.4 F (36.9 C) (Oral)   Wt 140 lb 9.6 oz (63.8 kg)   SpO2 92%   BMI 23.40 kg/m   Wt Readings from Last 3 Encounters:  08/06/20 140 lb 9.6 oz (63.8 kg)  07/25/20 141 lb 3.3 oz (64 kg)  06/25/20 145 lb 15.1 oz (66.2 kg)    Physical Exam Vitals and nursing note reviewed.  Constitutional:      General: She is awake. She is not in acute distress.    Appearance: She is well-developed and well-groomed. She is not ill-appearing or toxic-appearing.  HENT:     Head: Normocephalic.     Right Ear: Hearing normal.     Left Ear: Hearing normal.  Eyes:     General: Lids are normal.        Right eye: No  discharge.        Left eye: No discharge.     Conjunctiva/sclera: Conjunctivae normal.     Pupils: Pupils are equal, round, and reactive to light.  Neck:     Thyroid: No thyromegaly.     Vascular: No carotid bruit.  Cardiovascular:     Rate and Rhythm: Normal rate and regular rhythm.     Heart sounds: Normal heart sounds. No murmur heard.   No gallop.  Pulmonary:     Effort: Pulmonary effort is normal. No accessory muscle usage or respiratory distress.     Breath sounds: Normal breath sounds.  Abdominal:     General: Bowel sounds are normal.     Palpations: Abdomen is soft.  Musculoskeletal:  Cervical back: Normal range of motion and neck supple.     Right lower leg: No edema.     Left lower leg: No edema.  Lymphadenopathy:     Cervical: No cervical adenopathy.  Skin:    General: Skin is warm and dry.  Neurological:     Mental Status: She is alert and oriented to person, place, and time.     Deep Tendon Reflexes: Reflexes are normal and symmetric.     Reflex Scores:      Brachioradialis reflexes are 2+ on the right side and 2+ on the left side.      Patellar reflexes are 2+ on the right side and 2+ on the left side. Psychiatric:        Attention and Perception: Attention normal.        Mood and Affect: Mood normal.        Speech: Speech normal.        Behavior: Behavior normal. Behavior is cooperative.        Thought Content: Thought content normal.    Results for orders placed or performed in visit on 07/25/20  Lactate dehydrogenase  Result Value Ref Range   LDH 133 98 - 192 U/L  Comprehensive metabolic panel  Result Value Ref Range   Sodium 137 135 - 145 mmol/L   Potassium 4.2 3.5 - 5.1 mmol/L   Chloride 104 98 - 111 mmol/L   CO2 25 22 - 32 mmol/L   Glucose, Bld 103 (H) 70 - 99 mg/dL   BUN 10 8 - 23 mg/dL   Creatinine, Ser 0.80 0.44 - 1.00 mg/dL   Calcium 9.0 8.9 - 10.3 mg/dL   Total Protein 6.4 (L) 6.5 - 8.1 g/dL   Albumin 4.6 3.5 - 5.0 g/dL   AST 20 15 -  41 U/L   ALT 13 0 - 44 U/L   Alkaline Phosphatase 78 38 - 126 U/L   Total Bilirubin 1.0 0.3 - 1.2 mg/dL   GFR, Estimated >60 >60 mL/min   Anion gap 8 5 - 15  CBC with Differential/Platelet  Result Value Ref Range   WBC 60.0 (HH) 4.0 - 10.5 K/uL   RBC 4.50 3.87 - 5.11 MIL/uL   Hemoglobin 14.3 12.0 - 15.0 g/dL   HCT 43.6 36.0 - 46.0 %   MCV 96.9 80.0 - 100.0 fL   MCH 31.8 26.0 - 34.0 pg   MCHC 32.8 30.0 - 36.0 g/dL   RDW 14.0 11.5 - 15.5 %   Platelets 106 (L) 150 - 400 K/uL   nRBC 0.0 0.0 - 0.2 %   Neutrophils Relative % 4 %   Neutro Abs 2.1 1.7 - 7.7 K/uL   Lymphocytes Relative 94 %   Lymphs Abs 56.2 (H) 0.7 - 4.0 K/uL   Monocytes Relative 2 %   Monocytes Absolute 1.3 (H) 0.1 - 1.0 K/uL   Eosinophils Relative 0 %   Eosinophils Absolute 0.1 0.0 - 0.5 K/uL   Basophils Relative 0 %   Basophils Absolute 0.2 (H) 0.0 - 0.1 K/uL   Immature Granulocytes 0 %   Abs Immature Granulocytes 0.05 0.00 - 0.07 K/uL      Assessment & Plan:   Problem List Items Addressed This Visit       Cardiovascular and Mediastinum   Senile purpura ([place])    Noted on exam, recommend gentle skin care and monitoring for wounds, if wounds present immediately alert provider.       Essential hypertension  Chronic, ongoing with BP at goal today for age.  Continue current medication regimen and adjust as needed.  Recommend she monitor BP at least a few mornings a week at home and document.  DASH diet at home.  CMP up to date with oncology, reviewed.  Return in 6 months.         Atherosclerosis of aorta ([place])    Noted on imaging in past, recommend complete cessation of smoking and continue daily statin for prevention.         Respiratory   Centrilobular emphysema (HCC)    Chronic, ongoing.  Continue current medication regimen and collaboration with pulmonary as needed.  Recommend she avoid use of Benadryl due to age >51 and BEERS criteria, utilize Claritin instead.  Return to office in 6 months.             Other   Depression, recurrent (HCC)    Chronic, stable with no SI/HI.  Continue Paxil at this time and consider transition in future to Sertraline due to age >60 and anticholinergic effects with Paxil.  Continue Ativan as needed, patient aware of risks with long term use.  Refills sent in.  UDS today, obtain controlled substance contract next visit.  Return in 3 months.       Relevant Medications   LORazepam (ATIVAN) 1 MG tablet   CLL (chronic lymphocytic leukemia) (HCC) - Primary    Chronic, under treatment with oncology.  Recent note reviewed.  Continue collaboration with oncology provider.       Relevant Medications   LORazepam (ATIVAN) 1 MG tablet   Hypercholesterolemia    Chronic, ongoing.  Continue current medication regimen and adjust as needed.  Lipid panel next visit.        Anxiety    Chronic, stable with no SI/HI.  Continue Paxil at this time and consider transition in future to Sertraline due to age >62 and anticholinergic effects with Paxil.  Continue Ativan as needed, patient aware of risks with long term use.  Refills sent in.  UDS up to date, obtain controlled substance contract next visit.  Return in 3 months.       Relevant Medications   LORazepam (ATIVAN) 1 MG tablet   Nicotine dependence, cigarettes, uncomplicated    I have recommended complete cessation of tobacco use. I have discussed various options available for assistance with tobacco cessation including over the counter methods (Nicotine gum, patch and lozenges). We also discussed prescription options (Chantix, Nicotine Inhaler / Nasal Spray). The patient is not interested in pursuing any prescription tobacco cessation options at this time.        Thrombocytopenia ([place])    With underlying CLL, followed by oncology.  Continue this collaboration.       Long-term current use of benzodiazepine    Long term, patient aware of risks with long term use.  UDS up to date and controlled substance  contract next visit.       Other Visit Diagnoses     Colon cancer screening       Due for screening in July -- she will discuss with GI.   Relevant Orders   Ambulatory referral to Gastroenterology        Follow up plan: Return in about 3 months (around 11/06/2020) for MOOD.

## 2020-08-06 NOTE — Patient Instructions (Signed)

## 2020-08-06 NOTE — Assessment & Plan Note (Signed)
Long term, patient aware of risks with long term use.  UDS up to date and controlled substance contract next visit.

## 2020-08-06 NOTE — Assessment & Plan Note (Signed)
Noted on exam, recommend gentle skin care and monitoring for wounds, if wounds present immediately alert provider. 

## 2020-08-06 NOTE — Assessment & Plan Note (Signed)
With underlying CLL, followed by oncology.  Continue this collaboration. 

## 2020-08-06 NOTE — Assessment & Plan Note (Signed)
Chronic, ongoing.  Continue current medication regimen and adjust as needed.  Lipid panel next visit.    

## 2020-08-06 NOTE — Assessment & Plan Note (Signed)
Chronic, under treatment with oncology.  Recent note reviewed.  Continue collaboration with oncology provider.

## 2020-08-06 NOTE — Assessment & Plan Note (Signed)
I have recommended complete cessation of tobacco use. I have discussed various options available for assistance with tobacco cessation including over the counter methods (Nicotine gum, patch and lozenges). We also discussed prescription options (Chantix, Nicotine Inhaler / Nasal Spray). The patient is not interested in pursuing any prescription tobacco cessation options at this time.  

## 2020-08-06 NOTE — Assessment & Plan Note (Addendum)
Chronic, stable with no SI/HI.  Continue Paxil at this time and consider transition in future to Sertraline due to age >82 and anticholinergic effects with Paxil.  Continue Ativan as needed, patient aware of risks with long term use.  Refills sent in.  UDS up to date, obtain controlled substance contract next visit.  Return in 3 months.

## 2020-08-06 NOTE — Assessment & Plan Note (Signed)
Chronic, stable with no SI/HI.  Continue Paxil at this time and consider transition in future to Sertraline due to age >104 and anticholinergic effects with Paxil.  Continue Ativan as needed, patient aware of risks with long term use.  Refills sent in.  UDS today, obtain controlled substance contract next visit.  Return in 3 months.

## 2020-08-06 NOTE — Assessment & Plan Note (Signed)
Noted on imaging in past, recommend complete cessation of smoking and continue daily statin for prevention.

## 2020-08-25 IMAGING — MG DIGITAL SCREENING BILATERAL MAMMOGRAM WITH TOMO AND CAD
8 series · 8 of 24 positions shown · non-contrast
Comparison: Previous exam(s).

CLINICAL DATA: Screening.

EXAM:
DIGITAL SCREENING BILATERAL MAMMOGRAM WITH TOMO AND CAD

[L CC synth-2D]
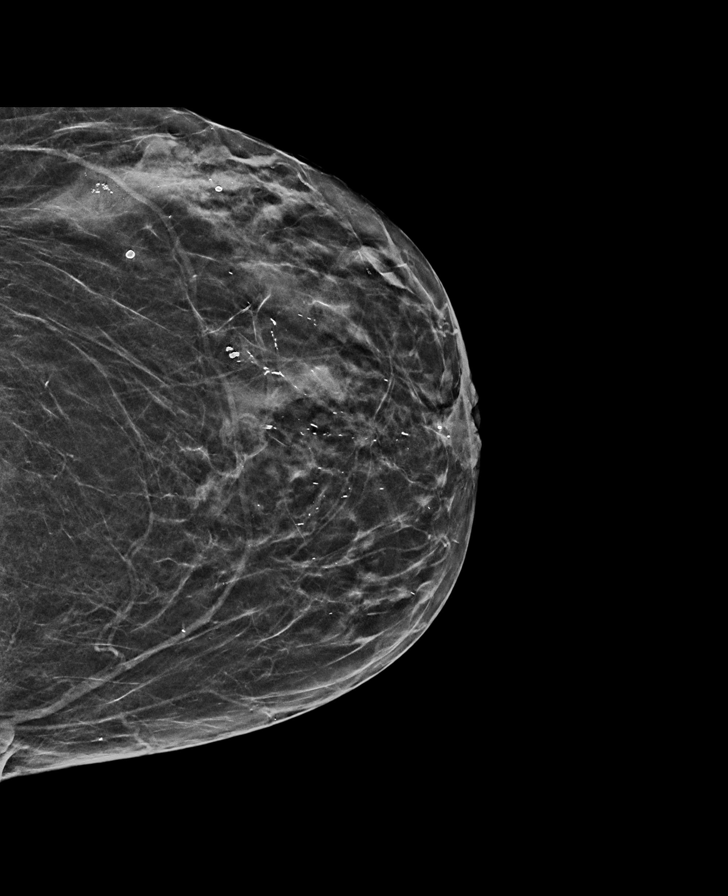

[R CC synth-2D]
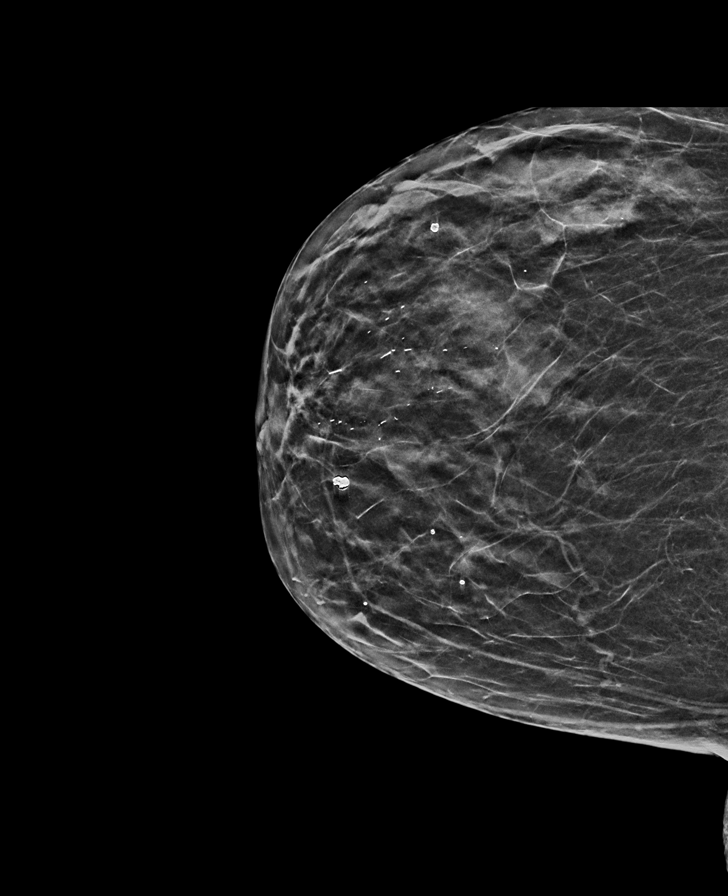

[L MLO synth-2D]
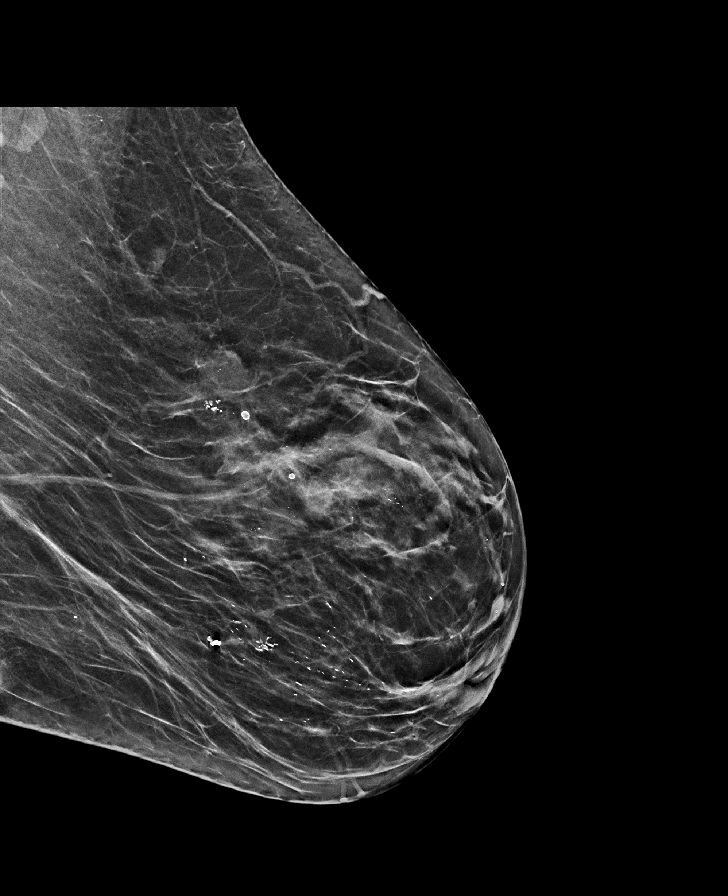

[R MLO synth-2D]
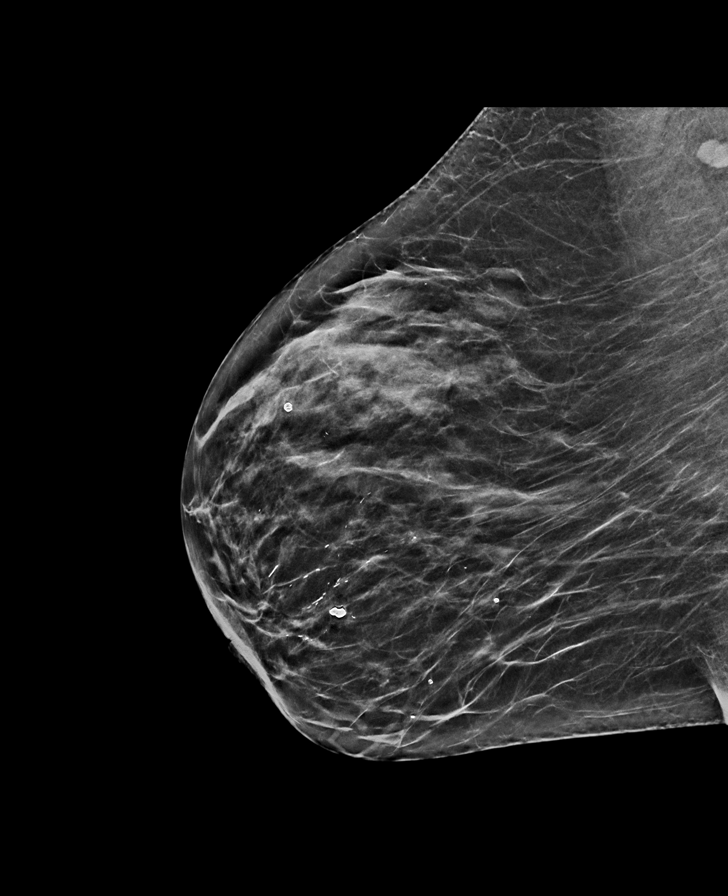

[L MLO tomo · tomo slice 28/55.0]
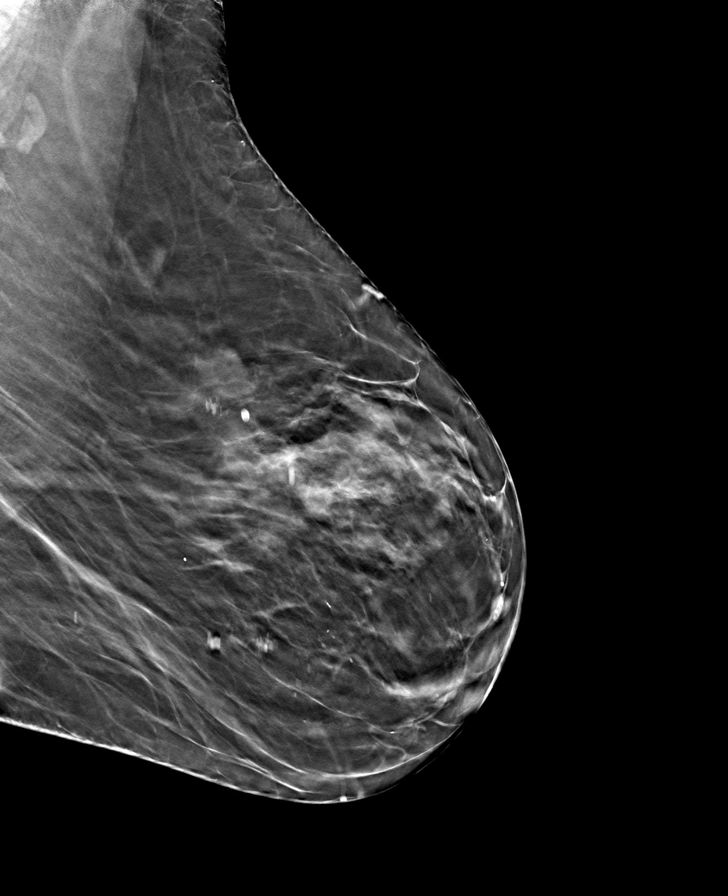

[R CC tomo · tomo slice 25/48.0]
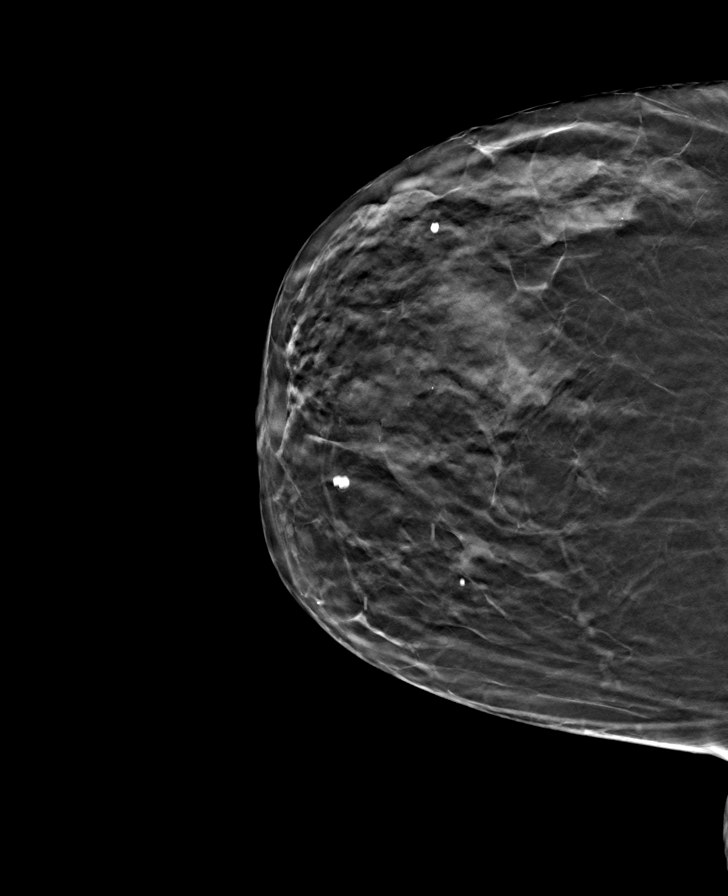

[L CC tomo · tomo slice 24/47.0]
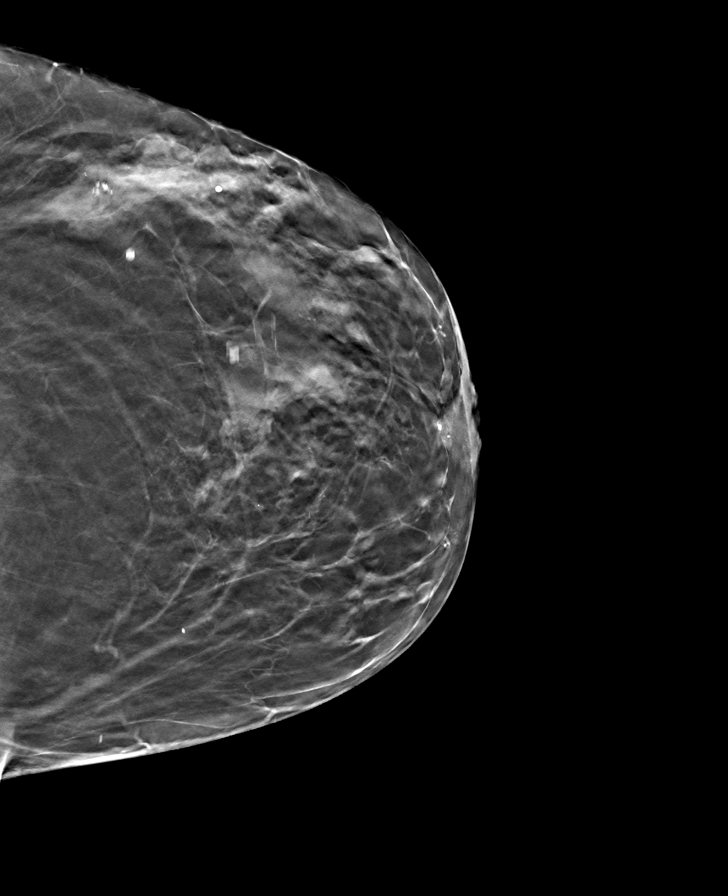

[R MLO tomo · tomo slice 28/55.0]
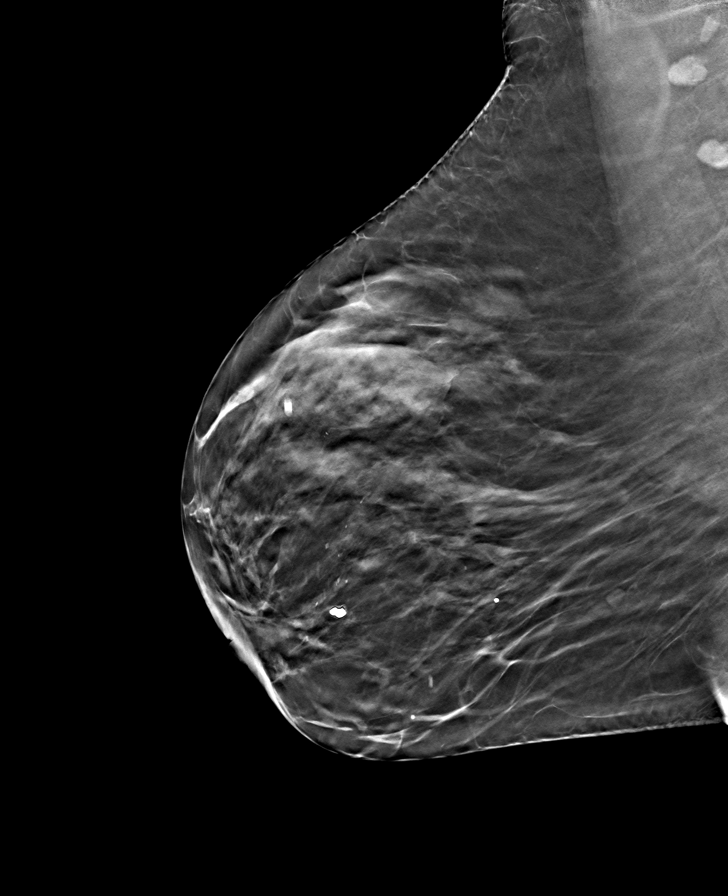

[8 of 24 positions shown; findings below may reference images not displayed]

ACR Breast Density Category c: The breast tissue is heterogeneously
dense, which may obscure small masses.
FINDINGS: There are no findings suspicious for malignancy. Images were
processed with CAD.
IMPRESSION: No mammographic evidence of malignancy. A result letter of this
screening mammogram will be mailed directly to the patient.

RECOMMENDATION:
Screening mammogram in one year. (Code:[id number]-U-LHB)

BI-RADS CATEGORY  1: Negative.

## 2020-09-23 ENCOUNTER — Inpatient Hospital Stay: Payer: Medicare HMO | Admitting: Internal Medicine

## 2020-09-23 ENCOUNTER — Other Ambulatory Visit: Payer: Self-pay

## 2020-09-23 ENCOUNTER — Encounter: Payer: Self-pay | Admitting: Internal Medicine

## 2020-09-23 ENCOUNTER — Inpatient Hospital Stay: Payer: Medicare HMO | Attending: Internal Medicine

## 2020-09-23 ENCOUNTER — Telehealth: Payer: Self-pay | Admitting: *Deleted

## 2020-09-23 DIAGNOSIS — C9110 Chronic lymphocytic leukemia of B-cell type not having achieved remission: Secondary | ICD-10-CM | POA: Diagnosis not present

## 2020-09-23 DIAGNOSIS — Z803 Family history of malignant neoplasm of breast: Secondary | ICD-10-CM | POA: Diagnosis not present

## 2020-09-23 DIAGNOSIS — R69 Illness, unspecified: Secondary | ICD-10-CM | POA: Diagnosis not present

## 2020-09-23 DIAGNOSIS — F17210 Nicotine dependence, cigarettes, uncomplicated: Secondary | ICD-10-CM | POA: Diagnosis not present

## 2020-09-23 DIAGNOSIS — R5381 Other malaise: Secondary | ICD-10-CM | POA: Insufficient documentation

## 2020-09-23 LAB — LACTATE DEHYDROGENASE: LDH: 136 U/L (ref 98–192)

## 2020-09-23 LAB — CBC WITH DIFFERENTIAL/PLATELET
Abs Immature Granulocytes: 0.06 10*3/uL (ref 0.00–0.07)
Basophils Absolute: 0.1 10*3/uL (ref 0.0–0.1)
Basophils Relative: 0 %
Eosinophils Absolute: 0.1 10*3/uL (ref 0.0–0.5)
Eosinophils Relative: 0 %
HCT: 43 % (ref 36.0–46.0)
Hemoglobin: 14.5 g/dL (ref 12.0–15.0)
Immature Granulocytes: 0 %
Lymphocytes Relative: 91 %
Lymphs Abs: 63.6 10*3/uL — ABNORMAL HIGH (ref 0.7–4.0)
MCH: 32.5 pg (ref 26.0–34.0)
MCHC: 33.7 g/dL (ref 30.0–36.0)
MCV: 96.4 fL (ref 80.0–100.0)
Monocytes Absolute: 3.3 10*3/uL — ABNORMAL HIGH (ref 0.1–1.0)
Monocytes Relative: 5 %
Neutro Abs: 2.7 10*3/uL (ref 1.7–7.7)
Neutrophils Relative %: 4 %
Platelets: 111 10*3/uL — ABNORMAL LOW (ref 150–400)
RBC: 4.46 MIL/uL (ref 3.87–5.11)
RDW: 15 % (ref 11.5–15.5)
WBC: 69.8 10*3/uL (ref 4.0–10.5)
nRBC: 0 % (ref 0.0–0.2)

## 2020-09-23 LAB — COMPREHENSIVE METABOLIC PANEL
ALT: 14 U/L (ref 0–44)
AST: 22 U/L (ref 15–41)
Albumin: 4.4 g/dL (ref 3.5–5.0)
Alkaline Phosphatase: 84 U/L (ref 38–126)
Anion gap: 6 (ref 5–15)
BUN: 9 mg/dL (ref 8–23)
CO2: 28 mmol/L (ref 22–32)
Calcium: 9.1 mg/dL (ref 8.9–10.3)
Chloride: 103 mmol/L (ref 98–111)
Creatinine, Ser: 0.84 mg/dL (ref 0.44–1.00)
GFR, Estimated: >60 mL/min (ref >60)
Glucose, Bld: 104 mg/dL — ABNORMAL HIGH (ref 70–99)
Potassium: 4 mmol/L (ref 3.5–5.1)
Sodium: 137 mmol/L (ref 135–145)
Total Bilirubin: 1 mg/dL (ref 0.3–1.2)
Total Protein: 6.4 g/dL — ABNORMAL LOW (ref 6.5–8.1)

## 2020-09-23 NOTE — Telephone Encounter (Signed)
Received incoming call from Caren Griffins in the [place] lab- @ 1120 am. Critical lab 69.8 wbc count. Read back process performed. Dr. Rogue Bussing aware-read back process performed with MD 1130 am

## 2020-09-23 NOTE — Progress Notes (Signed)
Pt will like to know if she can have a rx for nasacort because OTC is expensive.

## 2020-09-23 NOTE — Assessment & Plan Note (Addendum)
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 Deletion-poor tolerance to Ibrutinib; and Acalburtinib [stopped in March 2022 because of diarrhea].   #Patient's symptoms significantly improved off TKI [no significant fatigue diarrhea bruising headaches etc.].  Patient feels comfortable monitoring closely off any therapy at this time-aware that her white count is going up. If patient has signs and symptoms of progression-discussed regarding use IV therapies including rituximab [pt pref]; Gazyva Versus others.   #Smoker/lung cancer screening program-CT November 2021-3 mm LLL-   # Debility: disucssed re: protein/strengthening exercises..   # DISPOSITION: RN-  [id number] pt # Follow up in 2 months [Tuesday]-; MD; labs- cbc/CMP;LDH-Dr.B

## 2020-09-23 NOTE — Progress Notes (Signed)
[place] NOTE  Patient Care Team: Venita Lick, NP as PCP - General (Nurse Practitioner) [name], MD as PCP - Family Medicine (Family Medicine) Oneta Rack, MD (Dermatology) Manya Silvas, MD (Inactive) (Gastroenterology) Bary Castilla Forest Gleason, MD (General Surgery) Cammie Sickle, MD as Consulting Physician (Oncology)  CHIEF COMPLAINTS/PURPOSE OF CONSULTATION:  CLL  #  Oncology History Overview Note  Katherine [name] is a 80 y.o. female with chronic lymphocytic leukemia (CLL).  WBC has ranged between 22,000 - 101,7000 since 05/2011.   She has received Rituxan x 2 four week cycles (06/27/2015 and 05/27/2017).  Hepatitis B surface antigen and hepatitis B surface antibody were negative on 07/04/2015.   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  CCND1, ATM, chromosome 12, and TP53 were normal.     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  There was a CD5 and CD23 positive monoclonal B cell population with lambda light chain restriction, negative for FMC7 and CD38, representing  88% of leukocytes, >5,000/uL. There was no loss of, or aberrant expression of, the pan T cell  antigens to  suggest a neoplastic T cell process. CD4:CD8 ratio 2.0  No circulating blasts were detected. There was no immunophenotypic evidence of abnormal myeloid maturation.  IGH/BCL2 by FISH is pending. --------------------------------------------------------  She began ibrutinib on 04/24/2019 (discontinued on 05/30/2019).                     She developed blood-streaked sputum (slight) then significant hematuria.                     She declined further ibrutinib. ---------------------------------------------------------------------------------            She began acalabrutinib on 03/14/2020 (held on 05/13/2020 secondary to  diarrhea). --------------------------------------------------------------------------------------   She has a 35 pack year smoking history.  She is in the low dose chest CT program.  Low dose chest CT on 02/02/2018 revealed a new endobronchial lesion in the segmental bronchus to the medial segment of the right middle lobe. This may simply reflect an area of retained secretions, however, the possibility of an endobronchial neoplasm should be considered. Lung-RADS 4AS, suspicious.  Low dose chest CT on 07/05/2018 revealed Lung-RADS 2, benign appearance or behavior.  Prior right middle lobe endobronchial lesion/mucous plugging was no longer visualized.  New mucous plugging in the right lower lobe.   CLL (chronic lymphocytic leukemia) ([place])  05/28/2015 Initial Diagnosis   CLL (chronic lymphocytic leukemia) ([facility])      HISTORY OF PRESENTING ILLNESS:  Katherine [name] 80 y.o.  female CLL-13 q. Deletion currently off any therapy because of intolerance/side effects to multiple  TKIs is here for follow-up.  Patient denies any worsening shortness of breath or cough.  Denies any new lumps or bumps.  Chronic mild fatigue not any worse.  Appetite is good.  No weight loss.  No nitrates.   Review of Systems  Constitutional:  Positive for malaise/fatigue. Negative for chills, diaphoresis, fever and weight loss.  HENT:  Negative for nosebleeds and sore throat.   Eyes:  Negative for double vision.  Respiratory:  Negative for cough, hemoptysis, sputum production, shortness of breath and wheezing.   Cardiovascular:  Negative for chest pain, palpitations, orthopnea and leg swelling.  Gastrointestinal:  Negative for abdominal pain, blood in stool, constipation, diarrhea, heartburn,  melena, nausea and vomiting.  Genitourinary:  Negative for dysuria, frequency and urgency.  Musculoskeletal:  Positive for back pain. Negative for joint pain.  Skin: Negative.  Negative for itching and rash.  Neurological:  Negative  for dizziness, tingling, focal weakness, weakness and headaches.  Endo/Heme/Allergies:  Does not bruise/bleed easily.  Psychiatric/Behavioral:  Negative for depression. The patient is not nervous/anxious and does not have insomnia.     MEDICAL HISTORY:  Past Medical History:  Diagnosis Date   Allergy    Anxiety    Depression    GERD (gastroesophageal reflux disease)    Hemorrhoids    Hypertension    Leukemia, lymphoid ([place])    CLL   Lobar pneumonia (HCC)    Osteopenia    Personal history of chemotherapy     SURGICAL HISTORY: Past Surgical History:  Procedure Laterality Date   ABDOMINAL HYSTERECTOMY     APPENDECTOMY     BREAST BIOPSY Left    bx x 3-neg   COLON SURGERY     sigmoid resection   COLONOSCOPY     2007, 2012   COLONOSCOPY WITH PROPOFOL N/A 09/17/2015   Procedure: COLONOSCOPY WITH PROPOFOL;  Surgeon: Robert Bellow, MD;  Location: [facility] ENDOSCOPY;  Service: Endoscopy;  Laterality: N/A;   OOPHORECTOMY     SPINE SURGERY     L4-5    SOCIAL HISTORY: Social History   Socioeconomic History   Marital status: Married    Spouse name: Not on file   Number of children: Not on file   Years of education: 12   Highest education level: 12th grade  Occupational History   Not on file  Tobacco Use   Smoking status: Every Day    Packs/day: 1.00    Years: 35.00    Pack years: 35.00    Types: Cigarettes   Smokeless tobacco: Never   Tobacco comments:    5 cigarettes daily--01/22/2020  Vaping Use   Vaping Use: Never used  Substance and Sexual Activity   Alcohol use: No    Alcohol/week: 0.0 standard drinks   Drug use: No   Sexual activity: Not on file  Other Topics Concern   Not on file  Social History Narrative   Not on file   Social Determinants of Health   Financial Resource Strain: Not on file  Food Insecurity: Not on file  Transportation Needs: Not on file  Physical Activity: Not on file  Stress: Not on file  Social Connections: Not on file   Intimate Partner Violence: Not on file    FAMILY HISTORY: Family History  Problem Relation Age of Onset   Osteoporosis Mother    Hypertension Father    Heart attack Father    Stroke Maternal Grandfather    Breast cancer Sister 56    ALLERGIES:  is allergic to augmentin [amoxicillin-pot clavulanate] and biaxin [clarithromycin].  MEDICATIONS:  Current Outpatient Medications  Medication Sig Dispense Refill   albuterol (VENTOLIN HFA) 108 (90 Base) MCG/ACT inhaler TAKE 2 PUFFS BY MOUTH EVERY 6 HOURS AS NEEDED FOR WHEEZE OR SHORTNESS OF BREATH 108 g 9   atorvastatin (LIPITOR) 10 MG tablet Take 1 tablet (10 mg total) by mouth daily. 90 tablet 4   benazepril (LOTENSIN) 40 MG tablet Take 1 tablet (40 mg total) by mouth daily. 90 tablet 4   diphenhydrAMINE (BENADRYL) 25 MG tablet Take 25 mg by mouth at bedtime as needed.     LORazepam (ATIVAN) 1 MG tablet Take 0.5 tablets (0.5 mg total)  by mouth 2 (two) times daily as needed for anxiety. 60 tablet 2   Multiple Vitamins-Minerals (MULTIVITAMIN WITH MINERALS) tablet Take 1 tablet by mouth daily.     PARoxetine (PAXIL) 30 MG tablet Take 1 tablet (30 mg total) by mouth daily. 90 tablet 4   Spacer/Aero-Holding Chambers (OPTICHAMBER DIAMOND) MISC 1 EACH BY OTHER ROUTE ONCE FOR 1 DOSE.     No current facility-administered medications for this visit.      Marland Kitchen  PHYSICAL EXAMINATION: ECOG PERFORMANCE STATUS: 1 - Symptomatic but completely ambulatory  Vitals:   09/23/20 1020  BP: 113/68  Pulse: 84  Resp: 16  Temp: (!) 96.6 F (35.9 C)  SpO2: 94%   Filed Weights   09/23/20 1020  Weight: 141 lb 3.3 oz (64 kg)    Physical Exam Constitutional:      Comments: Frail-appearing Caucasian female patient.  Accompanied by daughter  HENT:     Head: Normocephalic and atraumatic.     Mouth/Throat:     Pharynx: No oropharyngeal exudate.  Eyes:     Pupils: Pupils are equal, round, and reactive to light.  Cardiovascular:     Rate and Rhythm:  Normal rate and regular rhythm.  Pulmonary:     Effort: No respiratory distress.     Breath sounds: No wheezing.     Comments: Decreased air entry bilaterally at the bases. Abdominal:     General: Bowel sounds are normal. There is no distension.     Palpations: Abdomen is soft. There is no mass.     Tenderness: no abdominal tenderness There is no guarding or rebound.  Musculoskeletal:        General: No tenderness. Normal range of motion.     Cervical back: Normal range of motion and neck supple.  Skin:    General: Skin is warm.  Neurological:     Mental Status: She is alert and oriented to person, place, and time.  Psychiatric:        Mood and Affect: Affect normal.     LABORATORY DATA:  I have reviewed the data as listed Lab Results  Component Value Date   WBC 69.8 (HH) 09/23/2020   HGB 14.5 09/23/2020   HCT 43.0 09/23/2020   MCV 96.4 09/23/2020   PLT 111 (L) 09/23/2020   Recent Labs    06/25/20 1015 07/25/20 1155 09/23/20 1009  NA 136 137 137  K 4.2 4.2 4.0  CL 101 104 103  CO2 28 25 28   GLUCOSE 92 103* 104*  BUN 9 10 9   CREATININE 0.72 0.80 0.84  CALCIUM 9.2 9.0 9.1  GFRNONAA >60 >60 >60  PROT 6.5 6.4* 6.4*  ALBUMIN 4.6 4.6 4.4  AST 22 20 22   ALT 11 13 14   ALKPHOS 98 78 84  BILITOT 1.0 1.0 1.0    RADIOGRAPHIC STUDIES: I have personally reviewed the radiological images as listed and agreed with the findings in the report. No results found.  ASSESSMENT & PLAN:   CLL (chronic lymphocytic leukemia) ([place]) #CLL-Rai stage II; FISH 13 q. Deletion-poor tolerance to Ibrutinib; and Acalburtinib [stopped in March 2022 because of diarrhea].   #Patient's symptoms significantly improved off TKI [no significant fatigue diarrhea bruising headaches etc.].  Patient feels comfortable monitoring closely off any therapy at this time-aware that her white count is going up. If patient has signs and symptoms of progression-discussed regarding use IV therapies including  rituximab [pt pref]; Gazyva Versus others.   #Smoker/lung cancer screening program-CT November 2021-3 mm  LLL-   # Debility: disucssed re: protein/strengthening exercises..   # DISPOSITION: RN-  [id number] pt # Follow up in 2 months [Tuesday]-; MD; labs- cbc/CMP;LDH-Dr.B  All questions were answered. The patient knows to call the clinic with any problems, questions or concerns.   Cammie Sickle, MD 10/12/2020 9:03 PM

## 2020-09-25 ENCOUNTER — Telehealth: Payer: Self-pay | Admitting: *Deleted

## 2020-09-25 ENCOUNTER — Other Ambulatory Visit: Payer: Self-pay | Admitting: *Deleted

## 2020-09-25 NOTE — Telephone Encounter (Signed)
-----   Message from Cammie Sickle, MD sent at 09/24/2020  4:49 PM EDT ----- H/T-please inform patient that her WBC count is elevated at 69; slightly up from 2 months ago.  Otherwise overall stable clinical follow-up in 2 months  Thanks.GB

## 2020-10-12 ENCOUNTER — Encounter: Payer: Self-pay | Admitting: Hematology and Oncology

## 2020-11-03 ENCOUNTER — Telehealth: Payer: Self-pay | Admitting: *Deleted

## 2020-11-03 NOTE — Telephone Encounter (Signed)
Received incoming fax from [facility] to request oncology clearance for removal for one tooth.  I spoke with Dr. Rogue Bussing. Patient is cleared from oncology standpoint. MD will sign clearance form. I will fax form back to surgeon's office.

## 2020-11-07 ENCOUNTER — Telehealth: Payer: Self-pay | Admitting: Internal Medicine

## 2020-11-07 NOTE — Telephone Encounter (Signed)
Return pt VM about appts. We need pt appts move on 10/4 to the [facility] location due to the providers not returning for clinic at the location after 10/1.

## 2020-11-08 ENCOUNTER — Encounter: Payer: Self-pay | Admitting: Hematology and Oncology

## 2020-11-11 ENCOUNTER — Inpatient Hospital Stay: Payer: Medicare HMO | Attending: Internal Medicine

## 2020-11-11 ENCOUNTER — Encounter: Payer: Self-pay | Admitting: Internal Medicine

## 2020-11-11 ENCOUNTER — Inpatient Hospital Stay: Payer: Medicare HMO | Admitting: Internal Medicine

## 2020-11-11 DIAGNOSIS — M8580 Other specified disorders of bone density and structure, unspecified site: Secondary | ICD-10-CM | POA: Insufficient documentation

## 2020-11-11 DIAGNOSIS — C9110 Chronic lymphocytic leukemia of B-cell type not having achieved remission: Secondary | ICD-10-CM | POA: Diagnosis not present

## 2020-11-11 DIAGNOSIS — Z79899 Other long term (current) drug therapy: Secondary | ICD-10-CM | POA: Insufficient documentation

## 2020-11-11 LAB — COMPREHENSIVE METABOLIC PANEL
ALT: 14 U/L (ref 0–44)
AST: 23 U/L (ref 15–41)
Albumin: 4.6 g/dL (ref 3.5–5.0)
Alkaline Phosphatase: 91 U/L (ref 38–126)
Anion gap: 5 (ref 5–15)
BUN: 9 mg/dL (ref 8–23)
CO2: 27 mmol/L (ref 22–32)
Calcium: 9.2 mg/dL (ref 8.9–10.3)
Chloride: 104 mmol/L (ref 98–111)
Creatinine, Ser: 0.79 mg/dL (ref 0.44–1.00)
GFR, Estimated: >60 mL/min (ref >60)
Glucose, Bld: 108 mg/dL — ABNORMAL HIGH (ref 70–99)
Potassium: 4.3 mmol/L (ref 3.5–5.1)
Sodium: 136 mmol/L (ref 135–145)
Total Bilirubin: 1.4 mg/dL — ABNORMAL HIGH (ref 0.3–1.2)
Total Protein: 6.5 g/dL (ref 6.5–8.1)

## 2020-11-11 LAB — CBC WITH DIFFERENTIAL/PLATELET
Abs Immature Granulocytes: 0.11 10*3/uL — ABNORMAL HIGH (ref 0.00–0.07)
Basophils Absolute: 0.1 10*3/uL (ref 0.0–0.1)
Basophils Relative: 0 %
Eosinophils Absolute: 0.1 10*3/uL (ref 0.0–0.5)
Eosinophils Relative: 0 %
HCT: 42.9 % (ref 36.0–46.0)
Hemoglobin: 14.4 g/dL (ref 12.0–15.0)
Immature Granulocytes: 0 %
Lymphocytes Relative: 91 %
Lymphs Abs: 88.4 10*3/uL — ABNORMAL HIGH (ref 0.7–4.0)
MCH: 33 pg (ref 26.0–34.0)
MCHC: 33.6 g/dL (ref 30.0–36.0)
MCV: 98.2 fL (ref 80.0–100.0)
Monocytes Absolute: 4.6 10*3/uL — ABNORMAL HIGH (ref 0.1–1.0)
Monocytes Relative: 5 %
Neutro Abs: 3.4 10*3/uL (ref 1.7–7.7)
Neutrophils Relative %: 4 %
Platelets: 127 10*3/uL — ABNORMAL LOW (ref 150–400)
RBC: 4.37 MIL/uL (ref 3.87–5.11)
RDW: 14.2 % (ref 11.5–15.5)
WBC: 96.7 10*3/uL (ref 4.0–10.5)
nRBC: 0 % (ref 0.0–0.2)

## 2020-11-11 LAB — LACTATE DEHYDROGENASE: LDH: 140 U/L (ref 98–192)

## 2020-11-11 NOTE — Progress Notes (Signed)
[place] NOTE  Patient Care Team: Venita Lick, NP as PCP - General (Nurse Practitioner) [name], MD as PCP - Family Medicine (Family Medicine) Oneta Rack, MD (Dermatology) Manya Silvas, MD (Inactive) (Gastroenterology) Bary Castilla Forest Gleason, MD (General Surgery) Cammie Sickle, MD as Consulting Physician (Oncology)  CHIEF COMPLAINTS/PURPOSE OF CONSULTATION:  CLL  #  Oncology History Overview Note  Katherine [name] is a 80 y.o. female with chronic lymphocytic leukemia (CLL).  WBC has ranged between 22,000 - 101,7000 since 05/2011.   She has received Rituxan x 2 four week cycles (06/27/2015 and 05/27/2017).  Hepatitis B surface antigen and hepatitis B surface antibody were negative on 07/04/2015.   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  CCND1, ATM, chromosome 12, and TP53 were normal.     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  There was a CD5 and CD23 positive monoclonal B cell population with lambda light chain restriction, negative for FMC7 and CD38, representing  88% of leukocytes, >5,000/uL. There was no loss of, or aberrant expression of, the pan T cell  antigens to  suggest a neoplastic T cell process. CD4:CD8 ratio 2.0  No circulating blasts were detected. There was no immunophenotypic evidence of abnormal myeloid maturation.  IGH/BCL2 by FISH is pending. --------------------------------------------------------  She began ibrutinib on 04/24/2019 (discontinued on 05/30/2019).                     She developed blood-streaked sputum (slight) then significant hematuria.                     She declined further ibrutinib. ---------------------------------------------------------------------------------            She began acalabrutinib on 03/14/2020 (held on 05/13/2020 secondary to  diarrhea). --------------------------------------------------------------------------------------   She has a 35 pack year smoking history.  She is in the low dose chest CT program.  Low dose chest CT on 02/02/2018 revealed a new endobronchial lesion in the segmental bronchus to the medial segment of the right middle lobe. This may simply reflect an area of retained secretions, however, the possibility of an endobronchial neoplasm should be considered. Lung-RADS 4AS, suspicious.  Low dose chest CT on 07/05/2018 revealed Lung-RADS 2, benign appearance or behavior.  Prior right middle lobe endobronchial lesion/mucous plugging was no longer visualized.  New mucous plugging in the right lower lobe.   CLL (chronic lymphocytic leukemia) ([place])  05/28/2015 Initial Diagnosis   CLL (chronic lymphocytic leukemia) ([facility])    HISTORY OF PRESENTING ILLNESS: Frail-appearing Caucasian female patient.  Alone.  Katherine [name] 80 y.o.  female CLL-13 q. Deletion currently off any therapy because of intolerance/side effects to multiple  TKIs is here for follow-up.  Patient denies any worsening shortness of breath or cough.  No worsening fatigue.  Patient states that she has been working with weights.  No weight loss.  Chronic mild swelling in the legs.  Review of Systems  Constitutional:  Positive for malaise/fatigue. Negative for chills, diaphoresis, fever and weight loss.  HENT:  Negative for nosebleeds and sore throat.   Eyes:  Negative for double vision.  Respiratory:  Negative for cough, hemoptysis, sputum production, shortness of breath and wheezing.   Cardiovascular:  Negative for chest pain, palpitations, orthopnea and leg swelling.  Gastrointestinal:  Negative for abdominal pain, blood in stool,  constipation, diarrhea, heartburn, melena, nausea and vomiting.  Genitourinary:  Negative for dysuria, frequency and urgency.  Musculoskeletal:  Positive for back pain. Negative for joint pain.  Skin: Negative.   Negative for itching and rash.  Neurological:  Negative for dizziness, tingling, focal weakness, weakness and headaches.  Endo/Heme/Allergies:  Does not bruise/bleed easily.  Psychiatric/Behavioral:  Negative for depression. The patient is not nervous/anxious and does not have insomnia.     MEDICAL HISTORY:  Past Medical History:  Diagnosis Date   Allergy    Anxiety    Depression    GERD (gastroesophageal reflux disease)    Hemorrhoids    Hypertension    Leukemia, lymphoid ([place])    CLL   Lobar pneumonia (HCC)    Osteopenia    Personal history of chemotherapy     SURGICAL HISTORY: Past Surgical History:  Procedure Laterality Date   ABDOMINAL HYSTERECTOMY     APPENDECTOMY     BREAST BIOPSY Left    bx x 3-neg   COLON SURGERY     sigmoid resection   COLONOSCOPY     2007, 2012   COLONOSCOPY WITH PROPOFOL N/A 09/17/2015   Procedure: COLONOSCOPY WITH PROPOFOL;  Surgeon: Robert Bellow, MD;  Location: [facility] ENDOSCOPY;  Service: Endoscopy;  Laterality: N/A;   OOPHORECTOMY     SPINE SURGERY     L4-5    SOCIAL HISTORY: Social History   Socioeconomic History   Marital status: Married    Spouse name: Not on file   Number of children: Not on file   Years of education: 12   Highest education level: 12th grade  Occupational History   Not on file  Tobacco Use   Smoking status: Every Day    Packs/day: 1.00    Years: 35.00    Pack years: 35.00    Types: Cigarettes   Smokeless tobacco: Never   Tobacco comments:    5 cigarettes daily--01/22/2020  Vaping Use   Vaping Use: Never used  Substance and Sexual Activity   Alcohol use: No    Alcohol/week: 0.0 standard drinks   Drug use: No   Sexual activity: Not on file  Other Topics Concern   Not on file  Social History Narrative   Not on file   Social Determinants of Health   Financial Resource Strain: Not on file  Food Insecurity: Not on file  Transportation Needs: Not on file  Physical Activity: Not on file   Stress: Not on file  Social Connections: Not on file  Intimate Partner Violence: Not on file    FAMILY HISTORY: Family History  Problem Relation Age of Onset   Osteoporosis Mother    Hypertension Father    Heart attack Father    Stroke Maternal Grandfather    Breast cancer Sister 70    ALLERGIES:  is allergic to augmentin [amoxicillin-pot clavulanate] and biaxin [clarithromycin].  MEDICATIONS:  Current Outpatient Medications  Medication Sig Dispense Refill   albuterol (VENTOLIN HFA) 108 (90 Base) MCG/ACT inhaler TAKE 2 PUFFS BY MOUTH EVERY 6 HOURS AS NEEDED FOR WHEEZE OR SHORTNESS OF BREATH 108 g 9   atorvastatin (LIPITOR) 10 MG tablet Take 1 tablet (10 mg total) by mouth daily. 90 tablet 4   benazepril (LOTENSIN) 40 MG tablet Take 1 tablet (40 mg total) by mouth daily. 90 tablet 4   diphenhydrAMINE (BENADRYL) 25 MG tablet Take 25 mg by mouth at bedtime as needed.     LORazepam (ATIVAN) 1 MG tablet Take 0.5 tablets (  0.5 mg total) by mouth 2 (two) times daily as needed for anxiety. 60 tablet 2   Multiple Vitamins-Minerals (MULTIVITAMIN WITH MINERALS) tablet Take 1 tablet by mouth daily.     PARoxetine (PAXIL) 30 MG tablet Take 1 tablet (30 mg total) by mouth daily. 90 tablet 4   Spacer/Aero-Holding Chambers (OPTICHAMBER DIAMOND) MISC 1 EACH BY OTHER ROUTE ONCE FOR 1 DOSE.     No current facility-administered medications for this visit.      Marland Kitchen  PHYSICAL EXAMINATION: ECOG PERFORMANCE STATUS: 1 - Symptomatic but completely ambulatory  Vitals:   11/11/20 0959  BP: 127/63  Pulse: 90  Resp: 16  Temp: (!) 96.4 F (35.8 C)  SpO2: 92%   Filed Weights   11/11/20 0959  Weight: 141 lb 3.3 oz (64 kg)    Physical Exam HENT:     Head: Normocephalic and atraumatic.     Mouth/Throat:     Pharynx: No oropharyngeal exudate.  Eyes:     Pupils: Pupils are equal, round, and reactive to light.  Cardiovascular:     Rate and Rhythm: Normal rate and regular rhythm.  Pulmonary:      Effort: No respiratory distress.     Breath sounds: No wheezing.     Comments: Decreased air entry bilaterally at the bases. Abdominal:     General: Bowel sounds are normal. There is no distension.     Palpations: Abdomen is soft. There is no mass.     Tenderness: There is no abdominal tenderness. There is no guarding or rebound.  Musculoskeletal:        General: No tenderness. Normal range of motion.     Cervical back: Normal range of motion and neck supple.  Skin:    General: Skin is warm.  Neurological:     Mental Status: She is alert and oriented to person, place, and time.  Psychiatric:        Mood and Affect: Affect normal.     LABORATORY DATA:  I have reviewed the data as listed Lab Results  Component Value Date   WBC 96.7 (HH) 11/11/2020   HGB 14.4 11/11/2020   HCT 42.9 11/11/2020   MCV 98.2 11/11/2020   PLT 127 (L) 11/11/2020   Recent Labs    07/25/20 1155 09/23/20 1009 11/11/20 0950  NA 137 137 136  K 4.2 4.0 4.3  CL 104 103 104  CO2 25 28 27   GLUCOSE 103* 104* 108*  BUN 10 9 9   CREATININE 0.80 0.84 0.79  CALCIUM 9.0 9.1 9.2  GFRNONAA >60 >60 >60  PROT 6.4* 6.4* 6.5  ALBUMIN 4.6 4.4 4.6  AST 20 22 23   ALT 13 14 14   ALKPHOS 78 84 91  BILITOT 1.0 1.0 1.4*    RADIOGRAPHIC STUDIES: I have personally reviewed the radiological images as listed and agreed with the findings in the report. No results found.  ASSESSMENT & PLAN:   CLL (chronic lymphocytic leukemia) ([place]) #CLL-Rai stage II; FISH 13 q. Deletion-poor tolerance to Ibrutinib; and Acalburtinib [stopped in March 2022 because of diarrhea].   #Given the multiple intolerance to TKI-reasonable to hold off any further therapy continue surveillance.  White count 90,000; otherwise platelets/hemoglobin stable.  Patient asymptomatic.If patient has signs and symptoms of progression-discussed regarding use IV therapies including rituximab [pt pref]; Gazyva Versus others.  # Smoker/lung cancer screening  program-CT November 2021-3 mm LLL- STABLE.   #Debility: disucssed re: protein/strengthening exercises-STABLE.   # DISPOSITION:  # Follow up in 2 months;MD;  labs- cbc/CMP;LDH-Dr.B  All questions were answered. The patient knows to call the clinic with any problems, questions or concerns.   Cammie Sickle, MD 11/11/2020 12:18 PM

## 2020-11-11 NOTE — Assessment & Plan Note (Addendum)
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 Deletion-poor tolerance to Ibrutinib; and Acalburtinib [stopped in March 2022 because of diarrhea].   #Given the multiple intolerance to TKI-reasonable to hold off any further therapy continue surveillance.  White count 90,000; otherwise platelets/hemoglobin stable.  Patient asymptomatic.If patient has signs and symptoms of progression-discussed regarding use IV therapies including rituximab [pt pref]; Gazyva Versus others.  # Smoker/lung cancer screening program-CT November 2021-3 mm LLL- STABLE.   #Debility: disucssed re: protein/strengthening exercises-STABLE.   # DISPOSITION:  # Follow up in 2 months;MD; labs- cbc/CMP;LDH-Dr.B

## 2020-11-11 NOTE — Progress Notes (Signed)
Pt states she has been experiencing random hot flashes.

## 2020-11-12 ENCOUNTER — Other Ambulatory Visit: Payer: Self-pay

## 2020-11-12 ENCOUNTER — Ambulatory Visit (INDEPENDENT_AMBULATORY_CARE_PROVIDER_SITE_OTHER): Payer: Medicare HMO | Admitting: Nurse Practitioner

## 2020-11-12 ENCOUNTER — Encounter: Payer: Self-pay | Admitting: Nurse Practitioner

## 2020-11-12 VITALS — BP 100/64 | HR 80 | Temp 98.8°F | Wt 140.2 lb

## 2020-11-12 DIAGNOSIS — F339 Major depressive disorder, recurrent, unspecified: Secondary | ICD-10-CM

## 2020-11-12 DIAGNOSIS — E7800 Pure hypercholesterolemia, unspecified: Secondary | ICD-10-CM

## 2020-11-12 DIAGNOSIS — Z23 Encounter for immunization: Secondary | ICD-10-CM | POA: Diagnosis not present

## 2020-11-12 DIAGNOSIS — C9110 Chronic lymphocytic leukemia of B-cell type not having achieved remission: Secondary | ICD-10-CM

## 2020-11-12 DIAGNOSIS — J432 Centrilobular emphysema: Secondary | ICD-10-CM | POA: Diagnosis not present

## 2020-11-12 DIAGNOSIS — I700 Atherosclerosis of aorta: Secondary | ICD-10-CM

## 2020-11-12 DIAGNOSIS — D696 Thrombocytopenia, unspecified: Secondary | ICD-10-CM

## 2020-11-12 DIAGNOSIS — Z79899 Other long term (current) drug therapy: Secondary | ICD-10-CM

## 2020-11-12 DIAGNOSIS — F17210 Nicotine dependence, cigarettes, uncomplicated: Secondary | ICD-10-CM

## 2020-11-12 DIAGNOSIS — I10 Essential (primary) hypertension: Secondary | ICD-10-CM

## 2020-11-12 DIAGNOSIS — R69 Illness, unspecified: Secondary | ICD-10-CM | POA: Diagnosis not present

## 2020-11-12 DIAGNOSIS — D692 Other nonthrombocytopenic purpura: Secondary | ICD-10-CM

## 2020-11-12 DIAGNOSIS — F419 Anxiety disorder, unspecified: Secondary | ICD-10-CM

## 2020-11-12 MED ORDER — BENAZEPRIL HCL 40 MG PO TABS: 40.0000 mg | ORAL_TABLET | Freq: Every day | ORAL | 4 refills | Status: DC

## 2020-11-12 MED ORDER — PAROXETINE HCL 30 MG PO TABS: 30.0000 mg | ORAL_TABLET | Freq: Every day | ORAL | 4 refills | Status: DC

## 2020-11-12 MED ORDER — ATORVASTATIN CALCIUM 10 MG PO TABS: 10.0000 mg | ORAL_TABLET | Freq: Every day | ORAL | 4 refills | Status: DC

## 2020-11-12 MED ORDER — LORAZEPAM 1 MG PO TABS: 0.5000 mg | ORAL_TABLET | Freq: Two times a day (BID) | ORAL | 4 refills | Status: DC | PRN

## 2020-11-12 NOTE — Assessment & Plan Note (Signed)
With underlying CLL, followed by oncology.  Continue this collaboration. 

## 2020-11-12 NOTE — Assessment & Plan Note (Signed)
Chronic, ongoing.  Continue current medication regimen and adjust as needed. Lipid panel today. 

## 2020-11-12 NOTE — Assessment & Plan Note (Signed)
Chronic, ongoing.  Continue current medication regimen and collaboration with pulmonary as needed.  Recommend she avoid use of Benadryl due to age >60 and BEERS criteria, utilize Claritin instead.  Return to office in 6 months.

## 2020-11-12 NOTE — Assessment & Plan Note (Signed)
Noted on exam, recommend gentle skin care and monitoring for wounds, if wounds present immediately alert provider. 

## 2020-11-12 NOTE — Patient Instructions (Signed)

## 2020-11-12 NOTE — Assessment & Plan Note (Signed)
Chronic, stable with no SI/HI.  Continue Paxil at this time and consider transition in future to Sertraline due to age >56 and anticholinergic effects with Paxil.  Continue Ativan as needed, patient aware of risks with long term use.  Refills sent in.  UDS today, obtain controlled substance contract next visit.  Return in 6 months.

## 2020-11-12 NOTE — Assessment & Plan Note (Signed)
Long term, patient aware of risks with long term use.  UDS up to date and controlled substance contract next visit.

## 2020-11-12 NOTE — Assessment & Plan Note (Signed)
Noted on imaging in past, recommend complete cessation of smoking and continue daily statin for prevention.

## 2020-11-12 NOTE — Assessment & Plan Note (Signed)
I have recommended complete cessation of tobacco use. I have discussed various options available for assistance with tobacco cessation including over the counter methods (Nicotine gum, patch and lozenges). We also discussed prescription options (Chantix, Nicotine Inhaler / Nasal Spray). The patient is not interested in pursuing any prescription tobacco cessation options at this time.  

## 2020-11-12 NOTE — Assessment & Plan Note (Signed)
Chronic, ongoing with BP at goal today for age.  Continue current medication regimen and adjust as needed.  Recommend she monitor BP at least a few mornings a week at home and document.  DASH diet at home.  CMP up to date with oncology, reviewed.  Return in 6 months.

## 2020-11-12 NOTE — Assessment & Plan Note (Signed)
Chronic, stable with no SI/HI.  Continue Paxil at this time and consider transition in future to Sertraline due to age >75 and anticholinergic effects with Paxil.  Continue Ativan as needed, patient aware of risks with long term use.  Refills sent in.  UDS up to date, obtain controlled substance contract next visit.  Return in 6 months -- will extend due to her multiple other provider visits.

## 2020-11-12 NOTE — Assessment & Plan Note (Addendum)
Chronic, under treatment with oncology.  Recent note and labs reviewed.  Continue collaboration with oncology provider.  Check blood type today as patient would like to know this.

## 2020-11-12 NOTE — Progress Notes (Signed)
BP 100/64   Pulse 80   Temp 98.8 F (37.1 C) (Oral)   Wt 140 lb 3.2 oz (63.6 kg)   SpO2 98%   BMI 23.33 kg/m    Subjective:    Patient ID: Katherine [name], female    DOB: 03/10/1940, 80 y.o.   MRN: [id number]  HPI: Katherine [name] is a 80 y.o. female  Chief Complaint  Patient presents with   Mood    Patient is she here for a follow up. Patient denies having any concerns at today's visit.    She would like blood type checked today, reports insurance will cover this, she spoke with them.  She does not recall this being checked and her children would like to know.  HYPERLIPIDEMIA Continues on Atorvastatin 10 MG.  Has underlying CLL and is followed closely by oncology with recent labs done by them, last visit 09/23/20.  Continues to smoke, about 5-7 cigarettes a day.  Has smoked for several years.  Has underlying emphysema and aortic atherosclerosis noted on past imaging and minimally uses her Albuterol. Hyperlipidemia status: good compliance Satisfied with current treatment?  yes Side effects:  no Medication compliance: good compliance Supplements: none Aspirin:  no The 10-year ASCVD risk score (Arnett DK, et al., 2019) is: 25.2%   Values used to calculate the score:     Age: 25 years     Sex: Female     Is Non-Hispanic African American: No     Diabetic: No     Tobacco smoker: Yes     Systolic Blood Pressure: 517 mmHg     Is BP treated: Yes     HDL Cholesterol: 36 mg/dL     Total Cholesterol: 130 mg/dL Chest pain:  no Coronary artery disease:  no Family history CAD:  yes Family history early CAD:  no   ANXIETY/STRESS Continues on Paxil 30 MG daily + Ativan 0.5 MG BID PRN -- often takes twice a day.  Pt is aware of risks of benzo medication use to include increased sedation, respiratory suppression, falls, dependence and cardiovascular events.  Pt would like to continue treatment as benefit determined to outweigh risk.  PDMP review last filled 11/07/20.    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   Duration:controlled Anxious mood: yes, in afternoon on occasion Excessive worrying: yes, occasional Irritability: no  Sweating: no Nausea: no Palpitations:no Hyperventilation: no Panic attacks: no Agoraphobia: no  Obscessions/compulsions: no Depressed mood: no Depression screen [facility] 2/9 11/12/2020 08/06/2020 02/05/2020 07/11/2019 01/11/2019  Decreased Interest 0 0 3 - 1  Down, Depressed, Hopeless 0 0 0 - 1  PHQ - 2 Score 0 0 3 - 2  Altered sleeping 0 0 0 0 0  Tired, decreased energy 1 0 3 1 2   Change in appetite 0 0 0 0 0  Feeling bad or failure about yourself  0 0 0 0 0  Trouble concentrating 0 0 0 0 0  Moving slowly or fidgety/restless 0 0 0 0 0  Suicidal thoughts 0 0 0 0 0  PHQ-9 Score 1 0 6 - 4  Difficult doing work/chores Not difficult at all - - - Somewhat difficult  Some recent data might be hidden  Anhedonia: no Weight changes: no Insomnia: none Hypersomnia: no Fatigue/loss of energy: no Feelings of worthlessness: no Feelings of guilt: no Impaired concentration/indecisiveness: no Suicidal ideations: no  Crying spells: no  Recent Stressors/Life Changes: no   Relationship problems: no   Family stress: no     Financial stress: no    Job stress: no    Recent death/loss: no GAD 7 : Generalized Anxiety Score 11/12/2020 08/06/2020 07/11/2019 07/11/2018  Nervous, Anxious, on Edge 0 0 0 2  Control/stop worrying 0 0 0 1  Worry too much - different things 0 0 0 1  Trouble relaxing 0 0 0 0  Restless 0 0 - 2  Easily annoyed or irritable 0 0 - 1  Afraid - awful might happen 0 0 - 0  Total GAD 7 Score 0 0 - 7  Anxiety Difficulty Not difficult at all Not difficult at all - Not difficult at all   Relevant past medical, surgical, family and social history reviewed and updated as indicated. Interim medical history since our last visit reviewed. Allergies and medications reviewed and  updated.  Review of Systems  Constitutional:  Negative for activity change, appetite change, diaphoresis, fatigue and fever.  HENT:  Negative for ear discharge and ear pain.   Respiratory:  Negative for cough, chest tightness and shortness of breath.   Cardiovascular:  Negative for chest pain, palpitations and leg swelling.  Gastrointestinal: Negative.   Neurological: Negative.   Psychiatric/Behavioral: Negative.     Per HPI unless specifically indicated above     Objective:    BP 100/64   Pulse 80   Temp 98.8 F (37.1 C) (Oral)   Wt 140 lb 3.2 oz (63.6 kg)   SpO2 98%   BMI 23.33 kg/m   Wt Readings from Last 3 Encounters:  11/12/20 140 lb 3.2 oz (63.6 kg)  11/11/20 141 lb 3.3 oz (64 kg)  09/23/20 141 lb 3.3 oz (64 kg)    Physical Exam Vitals and nursing note reviewed.  Constitutional:      General: She is awake. She is not in acute distress.    Appearance: She is well-developed and well-groomed. She is not ill-appearing or toxic-appearing.  HENT:     Head: Normocephalic.     Right Ear: Hearing normal.     Left Ear: Hearing normal.  Eyes:     General: Lids are normal.        Right eye: No discharge.        Left eye: No discharge.     Conjunctiva/sclera: Conjunctivae normal.     Pupils: Pupils are equal, round, and reactive to light.  Neck:     Thyroid: No thyromegaly.     Vascular: No carotid bruit.  Cardiovascular:     Rate and Rhythm: Normal rate and regular rhythm.     Heart sounds: Normal heart sounds. No murmur heard.   No gallop.  Pulmonary:     Effort: Pulmonary effort is normal. No accessory muscle usage or respiratory distress.     Breath sounds: Normal breath sounds.  Abdominal:     General: Bowel sounds are normal.     Palpations: Abdomen is soft.  Musculoskeletal:     Cervical back: Normal range of motion and neck supple.     Right lower leg: No edema.     Left lower leg: No edema.  Lymphadenopathy:     Cervical: No cervical adenopathy.   Skin:    General: Skin is warm and dry.     Comments: Scattered pale bruising to upper extremities.  Neurological:     Mental Status: She is alert and oriented to person, place, and time.  Deep Tendon Reflexes: Reflexes are normal and symmetric.     Reflex Scores:      Brachioradialis reflexes are 2+ on the right side and 2+ on the left side.      Patellar reflexes are 2+ on the right side and 2+ on the left side. Psychiatric:        Attention and Perception: Attention normal.        Mood and Affect: Mood normal.        Speech: Speech normal.        Behavior: Behavior normal. Behavior is cooperative.        Thought Content: Thought content normal.    Results for orders placed or performed in visit on 11/11/20  Lactate dehydrogenase  Result Value Ref Range   LDH 140 98 - 192 U/L  Comprehensive metabolic panel  Result Value Ref Range   Sodium 136 135 - 145 mmol/L   Potassium 4.3 3.5 - 5.1 mmol/L   Chloride 104 98 - 111 mmol/L   CO2 27 22 - 32 mmol/L   Glucose, Bld 108 (H) 70 - 99 mg/dL   BUN 9 8 - 23 mg/dL   Creatinine, Ser 0.79 0.44 - 1.00 mg/dL   Calcium 9.2 8.9 - 10.3 mg/dL   Total Protein 6.5 6.5 - 8.1 g/dL   Albumin 4.6 3.5 - 5.0 g/dL   AST 23 15 - 41 U/L   ALT 14 0 - 44 U/L   Alkaline Phosphatase 91 38 - 126 U/L   Total Bilirubin 1.4 (H) 0.3 - 1.2 mg/dL   GFR, Estimated >60 >60 mL/min   Anion gap 5 5 - 15  CBC with Differential  Result Value Ref Range   WBC 96.7 (HH) 4.0 - 10.5 K/uL   RBC 4.37 3.87 - 5.11 MIL/uL   Hemoglobin 14.4 12.0 - 15.0 g/dL   HCT 42.9 36.0 - 46.0 %   MCV 98.2 80.0 - 100.0 fL   MCH 33.0 26.0 - 34.0 pg   MCHC 33.6 30.0 - 36.0 g/dL   RDW 14.2 11.5 - 15.5 %   Platelets 127 (L) 150 - 400 K/uL   nRBC 0.0 0.0 - 0.2 %   Neutrophils Relative % 4 %   Neutro Abs 3.4 1.7 - 7.7 K/uL   Lymphocytes Relative 91 %   Lymphs Abs 88.4 (H) 0.7 - 4.0 K/uL   Monocytes Relative 5 %   Monocytes Absolute 4.6 (H) 0.1 - 1.0 K/uL   Eosinophils Relative 0 %    Eosinophils Absolute 0.1 0.0 - 0.5 K/uL   Basophils Relative 0 %   Basophils Absolute 0.1 0.0 - 0.1 K/uL   WBC Morphology ABSOLUTE LYMPHOCYTOSIS    RBC Morphology MORPHOLOGY UNREMARKABLE    Smear Review PLATELET COUNT CONFIRMED BY SMEAR    Immature Granulocytes 0 %   Abs Immature Granulocytes 0.11 (H) 0.00 - 0.07 K/uL   Abnormal Lymphocytes Present PRESENT    Smudge Cells PRESENT       Assessment & Plan:   Problem List Items Addressed This Visit       Cardiovascular and Mediastinum   Senile purpura ([place])    Noted on exam, recommend gentle skin care and monitoring for wounds, if wounds present immediately alert provider.      Relevant Medications   benazepril (LOTENSIN) 40 MG tablet   atorvastatin (LIPITOR) 10 MG tablet   Essential hypertension    Chronic, ongoing with BP at goal today for age.  Continue current medication regimen  and adjust as needed.  Recommend she monitor BP at least a few mornings a week at home and document.  DASH diet at home.  CMP up to date with oncology, reviewed.  Return in 6 months.        Relevant Medications   benazepril (LOTENSIN) 40 MG tablet   atorvastatin (LIPITOR) 10 MG tablet   Atherosclerosis of aorta (HCC)    Noted on imaging in past, recommend complete cessation of smoking and continue daily statin for prevention.      Relevant Medications   benazepril (LOTENSIN) 40 MG tablet   atorvastatin (LIPITOR) 10 MG tablet     Respiratory   Centrilobular emphysema (HCC)    Chronic, ongoing.  Continue current medication regimen and collaboration with pulmonary as needed.  Recommend she avoid use of Benadryl due to age >45 and BEERS criteria, utilize Claritin instead.  Return to office in 6 months.           Other   Depression, recurrent (HCC)    Chronic, stable with no SI/HI.  Continue Paxil at this time and consider transition in future to Sertraline due to age >65 and anticholinergic effects with Paxil.  Continue Ativan as needed,  patient aware of risks with long term use.  Refills sent in.  UDS today, obtain controlled substance contract next visit.  Return in 6 months.      Relevant Medications   LORazepam (ATIVAN) 1 MG tablet (Start on 12/05/2020)   PARoxetine (PAXIL) 30 MG tablet   CLL (chronic lymphocytic leukemia) ([place]) - Primary    Chronic, under treatment with oncology.  Recent note and labs reviewed.  Continue collaboration with oncology provider.  Check blood type today as patient would like to know this.      Relevant Medications   LORazepam (ATIVAN) 1 MG tablet (Start on 12/05/2020)   Other Relevant Orders   ABO and RH   Hypercholesterolemia    Chronic, ongoing.  Continue current medication regimen and adjust as needed.  Lipid panel today.       Relevant Medications   benazepril (LOTENSIN) 40 MG tablet   atorvastatin (LIPITOR) 10 MG tablet   Other Relevant Orders   Lipid Panel w/o Chol/HDL Ratio   Anxiety    Chronic, stable with no SI/HI.  Continue Paxil at this time and consider transition in future to Sertraline due to age >30 and anticholinergic effects with Paxil.  Continue Ativan as needed, patient aware of risks with long term use.  Refills sent in.  UDS up to date, obtain controlled substance contract next visit.  Return in 6 months -- will extend due to her multiple other provider visits.      Relevant Medications   LORazepam (ATIVAN) 1 MG tablet (Start on 12/05/2020)   PARoxetine (PAXIL) 30 MG tablet   Nicotine dependence, cigarettes, uncomplicated    I have recommended complete cessation of tobacco use. I have discussed various options available for assistance with tobacco cessation including over the counter methods (Nicotine gum, patch and lozenges). We also discussed prescription options (Chantix, Nicotine Inhaler / Nasal Spray). The patient is not interested in pursuing any prescription tobacco cessation options at this time.       Thrombocytopenia ([place])    With underlying CLL,  followed by oncology.  Continue this collaboration.      Long-term current use of benzodiazepine    Long term, patient aware of risks with long term use.  UDS up to date and controlled substance contract  next visit.      Other Visit Diagnoses     Flu vaccine need       Flu vaccine today   Relevant Orders   Flu Vaccine QUAD High Dose(Fluad) (Completed)        Follow up plan: Return in about 6 months (around 05/12/2021) for MOOD, CLL, HLD, COPD, GERD.

## 2020-11-13 LAB — ABO AND RH: Rh Factor: NEGATIVE

## 2020-11-13 LAB — LIPID PANEL W/O CHOL/HDL RATIO
Cholesterol, Total: 128 mg/dL (ref 100–199)
HDL: 36 mg/dL — ABNORMAL LOW (ref >39)
LDL Chol Calc (NIH): 66 mg/dL (ref 0–99)
Triglycerides: 148 mg/dL (ref 0–149)
VLDL Cholesterol Cal: 26 mg/dL (ref 5–40)

## 2020-11-13 LAB — ANTIBODY SCREEN: Antibody Screen: NEGATIVE

## 2020-11-13 NOTE — Progress Notes (Signed)
Contacted via MyChart   Good morning Katherine [name], your labs have returned.  Your blood type is O negative.  Cholesterol levels remain stable, continue all medication as ordered.  Any questions? Keep being amazing!!  Thank you for allowing me to participate in your care.  I appreciate you. Kindest regards, Irina Okelly

## 2020-11-14 ENCOUNTER — Telehealth: Payer: Self-pay | Admitting: *Deleted

## 2020-11-14 NOTE — Telephone Encounter (Signed)
Patient called office- she is unable to access her MyChart- and request results: Patient advised per MyChart- message:  Contacted via MyChart     Good morning Katherine [name], your labs have returned.  Your blood type is O negative.  Cholesterol levels remain stable, continue all medication as ordered.  Any questions? Keep being amazing!!  Thank you for allowing me to participate in your care.  I appreciate you. Kindest regards, Jolene

## 2020-11-18 ENCOUNTER — Ambulatory Visit: Payer: Medicare HMO | Admitting: Internal Medicine

## 2020-11-18 ENCOUNTER — Other Ambulatory Visit: Payer: Medicare HMO

## 2020-11-25 ENCOUNTER — Other Ambulatory Visit: Payer: Medicare HMO

## 2020-11-25 ENCOUNTER — Ambulatory Visit: Payer: Medicare HMO | Admitting: Internal Medicine

## 2021-01-13 ENCOUNTER — Inpatient Hospital Stay: Payer: Medicare HMO | Admitting: Internal Medicine

## 2021-01-13 ENCOUNTER — Other Ambulatory Visit: Payer: Self-pay

## 2021-01-13 ENCOUNTER — Inpatient Hospital Stay: Payer: Medicare HMO | Attending: Internal Medicine

## 2021-01-13 VITALS — BP 130/58 | HR 84 | Temp 97.3°F | Resp 18 | Wt 144.0 lb

## 2021-01-13 DIAGNOSIS — Z803 Family history of malignant neoplasm of breast: Secondary | ICD-10-CM | POA: Diagnosis not present

## 2021-01-13 DIAGNOSIS — C9110 Chronic lymphocytic leukemia of B-cell type not having achieved remission: Secondary | ICD-10-CM | POA: Insufficient documentation

## 2021-01-13 DIAGNOSIS — R911 Solitary pulmonary nodule: Secondary | ICD-10-CM

## 2021-01-13 DIAGNOSIS — R69 Illness, unspecified: Secondary | ICD-10-CM | POA: Diagnosis not present

## 2021-01-13 DIAGNOSIS — F17210 Nicotine dependence, cigarettes, uncomplicated: Secondary | ICD-10-CM | POA: Diagnosis not present

## 2021-01-13 DIAGNOSIS — R5381 Other malaise: Secondary | ICD-10-CM | POA: Insufficient documentation

## 2021-01-13 LAB — COMPREHENSIVE METABOLIC PANEL
ALT: 11 U/L (ref 0–44)
AST: 22 U/L (ref 15–41)
Albumin: 4.2 g/dL (ref 3.5–5.0)
Alkaline Phosphatase: 96 U/L (ref 38–126)
Anion gap: 7 (ref 5–15)
BUN: 8 mg/dL (ref 8–23)
CO2: 28 mmol/L (ref 22–32)
Calcium: 9 mg/dL (ref 8.9–10.3)
Chloride: 103 mmol/L (ref 98–111)
Creatinine, Ser: 0.83 mg/dL (ref 0.44–1.00)
GFR, Estimated: >60 mL/min (ref >60)
Glucose, Bld: 114 mg/dL — ABNORMAL HIGH (ref 70–99)
Potassium: 4.6 mmol/L (ref 3.5–5.1)
Sodium: 138 mmol/L (ref 135–145)
Total Bilirubin: 0.4 mg/dL (ref 0.3–1.2)
Total Protein: 6.1 g/dL — ABNORMAL LOW (ref 6.5–8.1)

## 2021-01-13 LAB — CBC WITH DIFFERENTIAL/PLATELET
Abs Immature Granulocytes: 0.1 10*3/uL — ABNORMAL HIGH (ref 0.00–0.07)
Basophils Absolute: 0.2 10*3/uL — ABNORMAL HIGH (ref 0.0–0.1)
Basophils Relative: 0 %
Eosinophils Absolute: 0.2 10*3/uL (ref 0.0–0.5)
Eosinophils Relative: 0 %
HCT: 40.7 % (ref 36.0–46.0)
Hemoglobin: 13.4 g/dL (ref 12.0–15.0)
Immature Granulocytes: 0 %
Lymphocytes Relative: 95 %
Lymphs Abs: 84.6 10*3/uL — ABNORMAL HIGH (ref 0.7–4.0)
MCH: 33.7 pg (ref 26.0–34.0)
MCHC: 32.9 g/dL (ref 30.0–36.0)
MCV: 102.3 fL — ABNORMAL HIGH (ref 80.0–100.0)
Monocytes Absolute: 1.9 10*3/uL — ABNORMAL HIGH (ref 0.1–1.0)
Monocytes Relative: 2 %
Neutro Abs: 2.9 10*3/uL (ref 1.7–7.7)
Neutrophils Relative %: 3 %
Platelets: 154 10*3/uL (ref 150–400)
RBC: 3.98 MIL/uL (ref 3.87–5.11)
RDW: 13.2 % (ref 11.5–15.5)
Smear Review: NORMAL
WBC: 89.9 10*3/uL (ref 4.0–10.5)
nRBC: 0 % (ref 0.0–0.2)

## 2021-01-13 LAB — LACTATE DEHYDROGENASE: LDH: 133 U/L (ref 98–192)

## 2021-01-13 NOTE — Assessment & Plan Note (Addendum)
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 Deletion-poor tolerance to Ibrutinib; and Acalburtinib [stopped in March 2022 because of diarrhea].   # Given the multiple intolerance to TKI-reasonable to hold off any further therapy continue surveillance.  White count 89,000; otherwise platelets/hemoglobin-STABLE.  Patient asymptomatic.If patient has signs and symptoms of progression-discussed regarding use IV therapies including rituximab [pt pref]; Gazyva Versus others.  # Smoker [1/2ppd]/lung cancer screening program-CT November 2021-3 mm LLL- STABLE. Will repeat Scan in 2 months/ordered today.  #Debility: disucssed re: protein/strengthening exercises-STABLE.   # DISPOSITION:  # Follow up in 2 months;MD; labs- cbc/CMP;LDH; CT scan prior--Dr.B

## 2021-01-13 NOTE — Progress Notes (Signed)
[place] NOTE  Patient Care Team: Venita Lick, NP as PCP - General (Nurse Practitioner) [name], MD as PCP - Family Medicine (Family Medicine) Oneta Rack, MD (Dermatology) Manya Silvas, MD (Inactive) (Gastroenterology) Bary Castilla Forest Gleason, MD (General Surgery) Cammie Sickle, MD as Consulting Physician (Oncology)  CHIEF COMPLAINTS/PURPOSE OF CONSULTATION:  CLL  #  Oncology History Overview Note  Katherine [name] is a 80 y.o. female with chronic lymphocytic leukemia (CLL).  WBC has ranged between 22,000 - 101,7000 since 05/2011.   She has received Rituxan x 2 four week cycles (06/27/2015 and 05/27/2017).  Hepatitis B surface antigen and hepatitis B surface antibody were negative on 07/04/2015.   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  CCND1, ATM, chromosome 12, and TP53 were normal.     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  There was a CD5 and CD23 positive monoclonal B cell population with lambda light chain restriction, negative for FMC7 and CD38, representing  88% of leukocytes, >5,000/uL. There was no loss of, or aberrant expression of, the pan T cell  antigens to  suggest a neoplastic T cell process. CD4:CD8 ratio 2.0  No circulating blasts were detected. There was no immunophenotypic evidence of abnormal myeloid maturation.  IGH/BCL2 by FISH is pending. --------------------------------------------------------  She began ibrutinib on 04/24/2019 (discontinued on 05/30/2019).                     She developed blood-streaked sputum (slight) then significant hematuria.                     She declined further ibrutinib. ---------------------------------------------------------------------------------            She began acalabrutinib on 03/14/2020 (held on 05/13/2020 secondary to  diarrhea). --------------------------------------------------------------------------------------   She has a 35 pack year smoking history.  She is in the low dose chest CT program.  Low dose chest CT on 02/02/2018 revealed a new endobronchial lesion in the segmental bronchus to the medial segment of the right middle lobe. This may simply reflect an area of retained secretions, however, the possibility of an endobronchial neoplasm should be considered. Lung-RADS 4AS, suspicious.  Low dose chest CT on 07/05/2018 revealed Lung-RADS 2, benign appearance or behavior.  Prior right middle lobe endobronchial lesion/mucous plugging was no longer visualized.  New mucous plugging in the right lower lobe.   CLL (chronic lymphocytic leukemia) ([place])  05/28/2015 Initial Diagnosis   CLL (chronic lymphocytic leukemia) (HCC)    HISTORY OF PRESENTING ILLNESS: Frail-appearing Caucasian female patient.  Accompanied by her daughter.  Katherine [name] 80 y.o.  female CLL-13 q. Deletion currently on surveillance because of intolerance/side effects to multiple  TKIs is here for follow-up.  Patient denies any worsening shortness of breath or cough.  She unfortunately continues to smoke.  No weight loss.  Chronic mild swelling in the legs.  No blood in stools or black or stools.  Occurred   Review of Systems  Constitutional:  Positive for malaise/fatigue. Negative for chills, diaphoresis, fever and weight loss.  HENT:  Negative for nosebleeds and sore throat.   Eyes:  Negative for double vision.  Respiratory:  Negative for cough, hemoptysis, sputum production, shortness of breath and wheezing.   Cardiovascular:  Negative for chest pain, palpitations, orthopnea and leg swelling.  Gastrointestinal:  Negative  for abdominal pain, blood in stool, constipation, diarrhea, heartburn, melena, nausea and vomiting.  Genitourinary:  Negative for dysuria, frequency and urgency.  Musculoskeletal:  Positive for back pain. Negative  for joint pain.  Skin: Negative.  Negative for itching and rash.  Neurological:  Negative for dizziness, tingling, focal weakness, weakness and headaches.  Endo/Heme/Allergies:  Does not bruise/bleed easily.  Psychiatric/Behavioral:  Negative for depression. The patient is not nervous/anxious and does not have insomnia.     MEDICAL HISTORY:  Past Medical History:  Diagnosis Date   Allergy    Anxiety    Depression    GERD (gastroesophageal reflux disease)    Hemorrhoids    Hypertension    Leukemia, lymphoid ([place])    CLL   Lobar pneumonia (HCC)    Osteopenia    Personal history of chemotherapy     SURGICAL HISTORY: Past Surgical History:  Procedure Laterality Date   ABDOMINAL HYSTERECTOMY     APPENDECTOMY     BREAST BIOPSY Left    bx x 3-neg   COLON SURGERY     sigmoid resection   COLONOSCOPY     2007, 2012   COLONOSCOPY WITH PROPOFOL N/A 09/17/2015   Procedure: COLONOSCOPY WITH PROPOFOL;  Surgeon: Robert Bellow, MD;  Location: [facility] ENDOSCOPY;  Service: Endoscopy;  Laterality: N/A;   OOPHORECTOMY     SPINE SURGERY     L4-5    SOCIAL HISTORY: Social History   Socioeconomic History   Marital status: Married    Spouse name: Not on file   Number of children: Not on file   Years of education: 12   Highest education level: 12th grade  Occupational History   Not on file  Tobacco Use   Smoking status: Every Day    Packs/day: 1.00    Years: 35.00    Pack years: 35.00    Types: Cigarettes   Smokeless tobacco: Never   Tobacco comments:    5 cigarettes daily--01/22/2020  Vaping Use   Vaping Use: Never used  Substance and Sexual Activity   Alcohol use: No    Alcohol/week: 0.0 standard drinks   Drug use: No   Sexual activity: Not on file  Other Topics Concern   Not on file  Social History Narrative   Not on file   Social Determinants of Health   Financial Resource Strain: Not on file  Food Insecurity: Not on file  Transportation Needs: Not on file   Physical Activity: Not on file  Stress: Not on file  Social Connections: Not on file  Intimate Partner Violence: Not on file    FAMILY HISTORY: Family History  Problem Relation Age of Onset   Osteoporosis Mother    Hypertension Father    Heart attack Father    Stroke Maternal Grandfather    Breast cancer Sister 4    ALLERGIES:  is allergic to augmentin [amoxicillin-pot clavulanate] and biaxin [clarithromycin].  MEDICATIONS:  Current Outpatient Medications  Medication Sig Dispense Refill   albuterol (VENTOLIN HFA) 108 (90 Base) MCG/ACT inhaler TAKE 2 PUFFS BY MOUTH EVERY 6 HOURS AS NEEDED FOR WHEEZE OR SHORTNESS OF BREATH 108 g 9   atorvastatin (LIPITOR) 10 MG tablet Take 1 tablet (10 mg total) by mouth daily. 90 tablet 4   benazepril (LOTENSIN) 40 MG tablet Take 1 tablet (40 mg total) by mouth daily. 90 tablet 4   diphenhydrAMINE (BENADRYL) 25 MG tablet Take 25 mg by mouth at bedtime as needed.     LORazepam (ATIVAN)  1 MG tablet Take 0.5 tablets (0.5 mg total) by mouth 2 (two) times daily as needed for anxiety. 60 tablet 4   Multiple Vitamins-Minerals (MULTIVITAMIN WITH MINERALS) tablet Take 1 tablet by mouth daily.     PARoxetine (PAXIL) 30 MG tablet Take 1 tablet (30 mg total) by mouth daily. 90 tablet 4   Spacer/Aero-Holding Chambers (OPTICHAMBER DIAMOND) MISC 1 EACH BY OTHER ROUTE ONCE FOR 1 DOSE.     No current facility-administered medications for this visit.      Marland Kitchen  PHYSICAL EXAMINATION: ECOG PERFORMANCE STATUS: 1 - Symptomatic but completely ambulatory  Vitals:   01/13/21 0949  BP: (!) 130/58  Pulse: 84  Resp: 18  Temp: (!) 97.3 F (36.3 C)  SpO2: 97%   Filed Weights   01/13/21 0949  Weight: 144 lb (65.3 kg)    Physical Exam HENT:     Head: Normocephalic and atraumatic.     Mouth/Throat:     Pharynx: No oropharyngeal exudate.  Eyes:     Pupils: Pupils are equal, round, and reactive to light.  Cardiovascular:     Rate and Rhythm: Normal rate and  regular rhythm.  Pulmonary:     Effort: No respiratory distress.     Breath sounds: No wheezing.     Comments: Decreased air entry bilaterally at the bases. Abdominal:     General: Bowel sounds are normal. There is no distension.     Palpations: Abdomen is soft. There is no mass.     Tenderness: There is no abdominal tenderness. There is no guarding or rebound.  Musculoskeletal:        General: No tenderness. Normal range of motion.     Cervical back: Normal range of motion and neck supple.  Skin:    General: Skin is warm.  Neurological:     Mental Status: She is alert and oriented to person, place, and time.  Psychiatric:        Mood and Affect: Affect normal.     LABORATORY DATA:  I have reviewed the data as listed Lab Results  Component Value Date   WBC 89.9 (HH) 01/13/2021   HGB 13.4 01/13/2021   HCT 40.7 01/13/2021   MCV 102.3 (H) 01/13/2021   PLT 154 01/13/2021   Recent Labs    09/23/20 1009 11/11/20 0950 01/13/21 0931  NA 137 136 138  K 4.0 4.3 4.6  CL 103 104 103  CO2 _0 GLUCOSE 104* 108* 114*  BUN _1 CREATININE 0.84 0.79 0.83  CALCIUM 9.1 9.2 9.0  GFRNONAA >60 >60 >60  PROT 6.4* 6.5 6.1*  ALBUMIN 4.4 4.6 4.2  AST _2 ALT _3 ALKPHOS 84 91 96  BILITOT 1.0 1.4* 0.4    RADIOGRAPHIC STUDIES: I have personally reviewed the radiological images as listed and agreed with the findings in the report. No results found.  ASSESSMENT & PLAN:   CLL (chronic lymphocytic leukemia) ([place]) #CLL-Rai stage II; FISH 13 q. Deletion-poor tolerance to Ibrutinib; and Acalburtinib [stopped in March 2022 because of diarrhea].   # Given the multiple intolerance to TKI-reasonable to hold off any further therapy continue surveillance.  White count 89,000; otherwise platelets/hemoglobin-STABLE.  Patient asymptomatic.If patient has signs and symptoms of progression-discussed regarding use IV therapies including rituximab [pt pref]; Gazyva Versus  others.  # Smoker [1/2ppd]/lung cancer screening program-CT November 2021-3 mm LLL- STABLE. Will repeat Scan in 2 months/ordered today.  #Debility: disucssed re: protein/strengthening  exercises-STABLE.   # DISPOSITION:  # Follow up in 2 months;MD; labs- cbc/CMP;LDH; CT scan prior--Dr.B  All questions were answered. The patient knows to call the clinic with any problems, questions or concerns.   Cammie Sickle, MD 01/13/2021 3:56 PM

## 2021-01-13 NOTE — Progress Notes (Signed)
Patient reports a red spot with a ring around it in her left inner thigh for a couple weeks, denies itching, swelling and pain. She states that it was black but she put peroxide on it and its red now.

## 2021-03-09 DIAGNOSIS — R531 Weakness: Secondary | ICD-10-CM | POA: Diagnosis not present

## 2021-03-09 DIAGNOSIS — Z20822 Contact with and (suspected) exposure to covid-19: Secondary | ICD-10-CM | POA: Diagnosis not present

## 2021-03-09 DIAGNOSIS — R5383 Other fatigue: Secondary | ICD-10-CM | POA: Diagnosis not present

## 2021-03-09 DIAGNOSIS — J189 Pneumonia, unspecified organism: Secondary | ICD-10-CM | POA: Diagnosis not present

## 2021-03-09 DIAGNOSIS — Z87891 Personal history of nicotine dependence: Secondary | ICD-10-CM | POA: Diagnosis not present

## 2021-03-09 DIAGNOSIS — R509 Fever, unspecified: Secondary | ICD-10-CM | POA: Insufficient documentation

## 2021-03-09 DIAGNOSIS — R918 Other nonspecific abnormal finding of lung field: Secondary | ICD-10-CM | POA: Diagnosis not present

## 2021-03-09 DIAGNOSIS — R69 Illness, unspecified: Secondary | ICD-10-CM | POA: Diagnosis not present

## 2021-03-09 DIAGNOSIS — R0902 Hypoxemia: Secondary | ICD-10-CM | POA: Insufficient documentation

## 2021-03-09 DIAGNOSIS — C9110 Chronic lymphocytic leukemia of B-cell type not having achieved remission: Secondary | ICD-10-CM | POA: Diagnosis not present

## 2021-03-09 DIAGNOSIS — J432 Centrilobular emphysema: Secondary | ICD-10-CM | POA: Diagnosis not present

## 2021-03-09 DIAGNOSIS — I10 Essential (primary) hypertension: Secondary | ICD-10-CM | POA: Diagnosis not present

## 2021-03-09 DIAGNOSIS — R059 Cough, unspecified: Secondary | ICD-10-CM | POA: Diagnosis not present

## 2021-03-09 DIAGNOSIS — Z9221 Personal history of antineoplastic chemotherapy: Secondary | ICD-10-CM | POA: Diagnosis not present

## 2021-03-09 DIAGNOSIS — Z880 Allergy status to penicillin: Secondary | ICD-10-CM | POA: Diagnosis not present

## 2021-03-09 DIAGNOSIS — J690 Pneumonitis due to inhalation of food and vomit: Secondary | ICD-10-CM | POA: Diagnosis not present

## 2021-03-09 DIAGNOSIS — J9601 Acute respiratory failure with hypoxia: Secondary | ICD-10-CM | POA: Diagnosis not present

## 2021-03-09 DIAGNOSIS — J90 Pleural effusion, not elsewhere classified: Secondary | ICD-10-CM | POA: Diagnosis not present

## 2021-03-09 DIAGNOSIS — F419 Anxiety disorder, unspecified: Secondary | ICD-10-CM | POA: Diagnosis not present

## 2021-03-10 DIAGNOSIS — J189 Pneumonia, unspecified organism: Secondary | ICD-10-CM | POA: Diagnosis not present

## 2021-03-10 DIAGNOSIS — R918 Other nonspecific abnormal finding of lung field: Secondary | ICD-10-CM | POA: Diagnosis not present

## 2021-03-10 DIAGNOSIS — I10 Essential (primary) hypertension: Secondary | ICD-10-CM | POA: Diagnosis not present

## 2021-03-10 DIAGNOSIS — R0902 Hypoxemia: Secondary | ICD-10-CM | POA: Diagnosis not present

## 2021-03-10 DIAGNOSIS — C9110 Chronic lymphocytic leukemia of B-cell type not having achieved remission: Secondary | ICD-10-CM | POA: Diagnosis not present

## 2021-03-10 DIAGNOSIS — R69 Illness, unspecified: Secondary | ICD-10-CM | POA: Diagnosis not present

## 2021-03-10 DIAGNOSIS — J90 Pleural effusion, not elsewhere classified: Secondary | ICD-10-CM | POA: Diagnosis not present

## 2021-03-10 DIAGNOSIS — J432 Centrilobular emphysema: Secondary | ICD-10-CM | POA: Diagnosis not present

## 2021-03-11 ENCOUNTER — Ambulatory Visit: Admission: RE | Admit: 2021-03-11 | Payer: Medicare HMO | Source: Ambulatory Visit

## 2021-03-11 ENCOUNTER — Telehealth: Payer: Self-pay | Admitting: *Deleted

## 2021-03-11 NOTE — Telephone Encounter (Signed)
RN reviewed chart prior to apt on 1/25. Dr. Nathanial Rancher- Patient is currently admitted at [facility] with pneumonia.

## 2021-03-12 DIAGNOSIS — R059 Cough, unspecified: Secondary | ICD-10-CM | POA: Diagnosis not present

## 2021-03-12 DIAGNOSIS — R509 Fever, unspecified: Secondary | ICD-10-CM | POA: Diagnosis not present

## 2021-03-12 DIAGNOSIS — R0902 Hypoxemia: Secondary | ICD-10-CM | POA: Diagnosis not present

## 2021-03-12 DIAGNOSIS — R5383 Other fatigue: Secondary | ICD-10-CM | POA: Diagnosis not present

## 2021-03-12 DIAGNOSIS — J189 Pneumonia, unspecified organism: Secondary | ICD-10-CM | POA: Insufficient documentation

## 2021-03-12 DIAGNOSIS — C9110 Chronic lymphocytic leukemia of B-cell type not having achieved remission: Secondary | ICD-10-CM | POA: Diagnosis not present

## 2021-03-12 DIAGNOSIS — R918 Other nonspecific abnormal finding of lung field: Secondary | ICD-10-CM | POA: Diagnosis not present

## 2021-03-13 DIAGNOSIS — R69 Illness, unspecified: Secondary | ICD-10-CM | POA: Diagnosis not present

## 2021-03-13 DIAGNOSIS — I10 Essential (primary) hypertension: Secondary | ICD-10-CM | POA: Diagnosis not present

## 2021-03-13 DIAGNOSIS — Z880 Allergy status to penicillin: Secondary | ICD-10-CM | POA: Diagnosis not present

## 2021-03-13 DIAGNOSIS — R0902 Hypoxemia: Secondary | ICD-10-CM | POA: Diagnosis not present

## 2021-03-13 DIAGNOSIS — J181 Lobar pneumonia, unspecified organism: Secondary | ICD-10-CM | POA: Diagnosis not present

## 2021-03-13 DIAGNOSIS — R059 Cough, unspecified: Secondary | ICD-10-CM | POA: Diagnosis not present

## 2021-03-13 DIAGNOSIS — R509 Fever, unspecified: Secondary | ICD-10-CM | POA: Diagnosis not present

## 2021-03-13 DIAGNOSIS — R5383 Other fatigue: Secondary | ICD-10-CM | POA: Diagnosis not present

## 2021-03-13 DIAGNOSIS — Z881 Allergy status to other antibiotic agents status: Secondary | ICD-10-CM | POA: Diagnosis not present

## 2021-03-13 DIAGNOSIS — C9110 Chronic lymphocytic leukemia of B-cell type not having achieved remission: Secondary | ICD-10-CM | POA: Diagnosis not present

## 2021-03-13 DIAGNOSIS — J189 Pneumonia, unspecified organism: Secondary | ICD-10-CM | POA: Diagnosis not present

## 2021-03-13 DIAGNOSIS — Z87891 Personal history of nicotine dependence: Secondary | ICD-10-CM | POA: Diagnosis not present

## 2021-03-18 ENCOUNTER — Inpatient Hospital Stay: Payer: Medicare HMO | Admitting: Internal Medicine

## 2021-03-18 ENCOUNTER — Encounter: Payer: Self-pay | Admitting: Internal Medicine

## 2021-03-18 ENCOUNTER — Other Ambulatory Visit: Payer: Self-pay

## 2021-03-18 ENCOUNTER — Inpatient Hospital Stay: Payer: Medicare HMO | Attending: Internal Medicine

## 2021-03-18 VITALS — BP 140/67 | HR 72 | Temp 97.5°F | Ht 65.0 in | Wt 140.8 lb

## 2021-03-18 DIAGNOSIS — R634 Abnormal weight loss: Secondary | ICD-10-CM | POA: Insufficient documentation

## 2021-03-18 DIAGNOSIS — C9110 Chronic lymphocytic leukemia of B-cell type not having achieved remission: Secondary | ICD-10-CM | POA: Insufficient documentation

## 2021-03-18 DIAGNOSIS — Z79899 Other long term (current) drug therapy: Secondary | ICD-10-CM | POA: Insufficient documentation

## 2021-03-18 DIAGNOSIS — F17210 Nicotine dependence, cigarettes, uncomplicated: Secondary | ICD-10-CM | POA: Diagnosis not present

## 2021-03-18 DIAGNOSIS — J189 Pneumonia, unspecified organism: Secondary | ICD-10-CM | POA: Diagnosis not present

## 2021-03-18 DIAGNOSIS — R0602 Shortness of breath: Secondary | ICD-10-CM | POA: Diagnosis not present

## 2021-03-18 DIAGNOSIS — R69 Illness, unspecified: Secondary | ICD-10-CM | POA: Diagnosis not present

## 2021-03-18 LAB — COMPREHENSIVE METABOLIC PANEL
ALT: 23 U/L (ref 0–44)
AST: 27 U/L (ref 15–41)
Albumin: 4.1 g/dL (ref 3.5–5.0)
Alkaline Phosphatase: 86 U/L (ref 38–126)
Anion gap: 8 (ref 5–15)
BUN: 10 mg/dL (ref 8–23)
CO2: 30 mmol/L (ref 22–32)
Calcium: 9.3 mg/dL (ref 8.9–10.3)
Chloride: 103 mmol/L (ref 98–111)
Creatinine, Ser: 0.69 mg/dL (ref 0.44–1.00)
GFR, Estimated: >60 mL/min (ref >60)
Glucose, Bld: 105 mg/dL — ABNORMAL HIGH (ref 70–99)
Potassium: 4.9 mmol/L (ref 3.5–5.1)
Sodium: 141 mmol/L (ref 135–145)
Total Bilirubin: 0.3 mg/dL (ref 0.3–1.2)
Total Protein: 6.5 g/dL (ref 6.5–8.1)

## 2021-03-18 LAB — CBC WITH DIFFERENTIAL/PLATELET
Abs Immature Granulocytes: 0.2 10*3/uL — ABNORMAL HIGH (ref 0.00–0.07)
Basophils Absolute: 0.1 10*3/uL (ref 0.0–0.1)
Basophils Relative: 0 %
Eosinophils Absolute: 0.2 10*3/uL (ref 0.0–0.5)
Eosinophils Relative: 0 %
HCT: 41 % (ref 36.0–46.0)
Hemoglobin: 13.1 g/dL (ref 12.0–15.0)
Immature Granulocytes: 0 %
Lymphocytes Relative: 94 %
Lymphs Abs: 102.1 10*3/uL — ABNORMAL HIGH (ref 0.7–4.0)
MCH: 32.8 pg (ref 26.0–34.0)
MCHC: 32 g/dL (ref 30.0–36.0)
MCV: 102.8 fL — ABNORMAL HIGH (ref 80.0–100.0)
Monocytes Absolute: 2.1 10*3/uL — ABNORMAL HIGH (ref 0.1–1.0)
Monocytes Relative: 2 %
Neutro Abs: 4.3 10*3/uL (ref 1.7–7.7)
Neutrophils Relative %: 4 %
Platelets: 290 10*3/uL (ref 150–400)
RBC: 3.99 MIL/uL (ref 3.87–5.11)
RDW: 13.5 % (ref 11.5–15.5)
Smear Review: NORMAL
WBC: 108.9 10*3/uL (ref 4.0–10.5)
nRBC: 0 % (ref 0.0–0.2)

## 2021-03-18 LAB — LACTATE DEHYDROGENASE: LDH: 167 U/L (ref 98–192)

## 2021-03-18 NOTE — Progress Notes (Signed)
Was dx with pneumonia last week.

## 2021-03-18 NOTE — Assessment & Plan Note (Addendum)
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 Deletion-poor tolerance to Ibrutinib; and Acalburtinib [stopped in March 2022 because of diarrhea].  Patient's white count today is 108; normal hemoglobin/platelets.  [?  Secondary recent pneumonia versus progression of CLL].    #Hold any further therapies-at this time given patient's recent pneumonia/see below.  We will repeat the blood work again in 4 weeks.  #Pneumonia -bilateral Jan 2023 [smoker] -CT November 2021-3 mm LLL-STABLE; recommend CT scan for further evaluation.  Ordered today in 1 month.  We will check immunoglobulins next visit.  # DISPOSITION:  # Follow up in 1 month;MD; labs- cbc/CMP;LDH; prior-CT scan prior--Dr.B

## 2021-03-18 NOTE — Progress Notes (Signed)
[place] NOTE  Katherine [name] Care Team: Venita Lick, NP as PCP - General (Nurse Practitioner) [name], MD as PCP - Family Medicine (Family Medicine) Oneta Rack, MD (Dermatology) Manya Silvas, MD (Inactive) (Gastroenterology) Bary Castilla Forest Gleason, MD (General Surgery) Cammie Sickle, MD as Consulting Physician (Oncology)  CHIEF COMPLAINTS/PURPOSE OF CONSULTATION:  CLL  #  Oncology History Overview Note  Katherine [name] is a 81 y.o. female with chronic lymphocytic leukemia (CLL).  WBC has ranged between 22,000 - 101,7000 since 05/2011.   Katherine [name] has received Rituxan x 2 four week cycles (06/27/2015 and 05/27/2017).  Hepatitis B surface antigen and hepatitis B surface antibody were negative on 07/04/2015.   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  CCND1, ATM, chromosome 12, and TP53 were normal.     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  There was a CD5 and CD23 positive monoclonal B cell population with lambda light chain restriction, negative for FMC7 and CD38, representing  88% of leukocytes, >5,000/uL. There was no loss of, or aberrant expression of, the pan T cell  antigens to  suggest a neoplastic T cell process. CD4:CD8 ratio 2.0  No circulating blasts were detected. There was no immunophenotypic evidence of abnormal myeloid maturation.  IGH/BCL2 by FISH is pending. --------------------------------------------------------  Katherine [name] began ibrutinib on 04/24/2019 (discontinued on 05/30/2019).                     Katherine [name] developed blood-streaked sputum (slight) then significant hematuria.                     Katherine [name] declined further ibrutinib. ---------------------------------------------------------------------------------            Katherine [name] began acalabrutinib on 03/14/2020 (held on 05/13/2020 secondary to  diarrhea). --------------------------------------------------------------------------------------   Katherine [name] has a 35 pack year smoking history.  Katherine [name] is in the low dose chest CT program.  Low dose chest CT on 02/02/2018 revealed a new endobronchial lesion in the segmental bronchus to the medial segment of the right middle lobe. This may simply reflect an area of retained secretions, however, the possibility of an endobronchial neoplasm should be considered. Lung-RADS 4AS, suspicious.  Low dose chest CT on 07/05/2018 revealed Lung-RADS 2, benign appearance or behavior.  Prior right middle lobe endobronchial lesion/mucous plugging was no longer visualized.  New mucous plugging in the right lower lobe.   CLL (chronic lymphocytic leukemia) ([place])  05/28/2015 Initial Diagnosis   CLL (chronic lymphocytic leukemia) ([facility])    HISTORY OF PRESENTING ILLNESS: Frail-appearing Caucasian female Katherine [name].  Accompanied by her daughter.  [name] 81 y.o.  female CLL-13 q. Deletion currently on surveillance because of intolerance/side effects to multiple  TKIs is here for follow-up.  In the interim Katherine [name] was recently admitted to hospital for pneumonia at [facility].  Katherine [name] is recovering fairly well.  Continues to have mild shortness of breath on exertion.  Positive for weight loss. Chronic mild swelling in the legs.  No blood in stools or black or stools.  Denies any new lumps or bumps.  Review of Systems  Constitutional:  Positive for malaise/fatigue. Negative for chills, diaphoresis, fever and weight loss.  HENT:  Negative for nosebleeds and sore throat.   Eyes:  Negative for double vision.  Respiratory:  Negative for cough, hemoptysis, sputum production, shortness of breath and  wheezing.   Cardiovascular:  Negative for chest pain, palpitations, orthopnea and leg swelling.  Gastrointestinal:  Negative for abdominal pain, blood in stool, constipation, diarrhea, heartburn, melena, nausea and vomiting.   Genitourinary:  Negative for dysuria, frequency and urgency.  Musculoskeletal:  Positive for back pain. Negative for joint pain.  Skin: Negative.  Negative for itching and rash.  Neurological:  Negative for dizziness, tingling, focal weakness, weakness and headaches.  Endo/Heme/Allergies:  Does not bruise/bleed easily.  Psychiatric/Behavioral:  Negative for depression. The Katherine [name] is not nervous/anxious and does not have insomnia.     MEDICAL HISTORY:  Past Medical History:  Diagnosis Date   Allergy    Anxiety    Depression    GERD (gastroesophageal reflux disease)    Hemorrhoids    Hypertension    Leukemia, lymphoid ([place])    CLL   Lobar pneumonia ([facility])    Osteopenia    Personal history of chemotherapy     SURGICAL HISTORY: Past Surgical History:  Procedure Laterality Date   ABDOMINAL HYSTERECTOMY     APPENDECTOMY     BREAST BIOPSY Left    bx x 3-neg   COLON SURGERY     sigmoid resection   COLONOSCOPY     2007, 2012   COLONOSCOPY WITH PROPOFOL N/A 09/17/2015   Procedure: COLONOSCOPY WITH PROPOFOL;  Surgeon: Robert Bellow, MD;  Location: [facility] ENDOSCOPY;  Service: Endoscopy;  Laterality: N/A;   OOPHORECTOMY     SPINE SURGERY     L4-5    SOCIAL HISTORY: Social History   Socioeconomic History   Marital status: Married    Spouse name: Not on file   Number of children: Not on file   Years of education: 12   Highest education level: 12th grade  Occupational History   Not on file  Tobacco Use   Smoking status: Every Day    Packs/day: 1.00    Years: 35.00    Pack years: 35.00    Types: Cigarettes   Smokeless tobacco: Never   Tobacco comments:    5 cigarettes daily--01/22/2020  Vaping Use   Vaping Use: Never used  Substance and Sexual Activity   Alcohol use: No    Alcohol/week: 0.0 standard drinks   Drug use: No   Sexual activity: Not on file  Other Topics Concern   Not on file  Social History Narrative   Not on file   Social Determinants of Health    Financial Resource Strain: Not on file  Food Insecurity: Not on file  Transportation Needs: Not on file  Physical Activity: Not on file  Stress: Not on file  Social Connections: Not on file  Intimate Partner Violence: Not on file    FAMILY HISTORY: Family History  Problem Relation Age of Onset   Osteoporosis Mother    Hypertension Father    Heart attack Father    Stroke Maternal Grandfather    Breast cancer Sister 69    ALLERGIES:  is allergic to augmentin [amoxicillin-pot clavulanate] and biaxin [clarithromycin].  MEDICATIONS:  Current Outpatient Medications  Medication Sig Dispense Refill   albuterol (VENTOLIN HFA) 108 (90 Base) MCG/ACT inhaler TAKE 2 PUFFS BY MOUTH EVERY 6 HOURS AS NEEDED FOR WHEEZE OR SHORTNESS OF BREATH 108 g 9   atorvastatin (LIPITOR) 10 MG tablet Take 1 tablet (10 mg total) by mouth daily. 90 tablet 4   benazepril (LOTENSIN) 40 MG tablet Take 1 tablet (40 mg total) by mouth daily. 90 tablet 4   diphenhydrAMINE (BENADRYL)  25 MG tablet Take 25 mg by mouth at bedtime as needed.     LORazepam (ATIVAN) 1 MG tablet Take 0.5 tablets (0.5 mg total) by mouth 2 (two) times daily as needed for anxiety. 60 tablet 4   Multiple Vitamins-Minerals (MULTIVITAMIN WITH MINERALS) tablet Take 1 tablet by mouth daily.     PARoxetine (PAXIL) 30 MG tablet Take 1 tablet (30 mg total) by mouth daily. 90 tablet 4   Spacer/Aero-Holding Chambers (OPTICHAMBER DIAMOND) MISC 1 EACH BY OTHER ROUTE ONCE FOR 1 DOSE.     No current facility-administered medications for this visit.      Marland Kitchen  PHYSICAL EXAMINATION: ECOG PERFORMANCE STATUS: 1 - Symptomatic but completely ambulatory  Vitals:   03/18/21 1014  BP: 140/67  Pulse: 72  Temp: (!) 97.5 F (36.4 C)  SpO2: 95%   Filed Weights   03/18/21 1014  Weight: 140 lb 12.8 oz (63.9 kg)    Physical Exam HENT:     Head: Normocephalic and atraumatic.     Mouth/Throat:     Pharynx: No oropharyngeal exudate.  Eyes:     Pupils:  Pupils are equal, round, and reactive to light.  Cardiovascular:     Rate and Rhythm: Normal rate and regular rhythm.  Pulmonary:     Effort: No respiratory distress.     Breath sounds: No wheezing.     Comments: Decreased air entry bilaterally at the bases. Abdominal:     General: Bowel sounds are normal. There is no distension.     Palpations: Abdomen is soft. There is no mass.     Tenderness: There is no abdominal tenderness. There is no guarding or rebound.  Musculoskeletal:        General: No tenderness. Normal range of motion.     Cervical back: Normal range of motion and neck supple.  Skin:    General: Skin is warm.  Neurological:     Mental Status: Katherine [name] is alert and oriented to person, place, and time.  Psychiatric:        Mood and Affect: Affect normal.     LABORATORY DATA:  I have reviewed the data as listed Lab Results  Component Value Date   WBC 108.9 (HH) 03/18/2021   HGB 13.1 03/18/2021   HCT 41.0 03/18/2021   MCV 102.8 (H) 03/18/2021   PLT 290 03/18/2021   Recent Labs    11/11/20 0950 01/13/21 0931 03/18/21 0946  NA 136 138 141  K 4.3 4.6 4.9  CL 104 103 103  CO2 _0 GLUCOSE 108* 114* 105*  BUN _1 CREATININE 0.79 0.83 0.69  CALCIUM 9.2 9.0 9.3  GFRNONAA >60 >60 >60  PROT 6.5 6.1* 6.5  ALBUMIN 4.6 4.2 4.1  AST _2 ALT _3 ALKPHOS 91 96 86  BILITOT 1.4* 0.4 0.3    RADIOGRAPHIC STUDIES: I have personally reviewed the radiological images as listed and agreed with the findings in the report. No results found.  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 Deletion-poor tolerance to Ibrutinib; and Acalburtinib [stopped in March 2022 because of diarrhea].  Katherine [name]'s white count today is 108; normal hemoglobin/platelets.  [?  Secondary recent pneumonia versus progression of CLL].    #Hold any further therapies-at this time given Katherine [name]'s recent pneumonia/see below.  We will  repeat the blood work again in 4 weeks.  #Pneumonia -bilateral Jan 2023 [smoker] -CT November  2021-3 mm LLL-STABLE; recommend CT scan for further evaluation.  Ordered today in 1 month.  We will check immunoglobulins next visit.  # DISPOSITION:  # Follow up in 1 month;MD; labs- cbc/CMP;LDH; prior-CT scan prior--Dr.B   All questions were answered. The Katherine [name] knows to call the clinic with any problems, questions or concerns.   Cammie Sickle, MD 03/18/2021 1:16 PM

## 2021-03-20 ENCOUNTER — Other Ambulatory Visit: Payer: Self-pay

## 2021-03-20 ENCOUNTER — Encounter: Payer: Self-pay | Admitting: Nurse Practitioner

## 2021-03-20 ENCOUNTER — Ambulatory Visit (INDEPENDENT_AMBULATORY_CARE_PROVIDER_SITE_OTHER): Payer: Medicare HMO | Admitting: Nurse Practitioner

## 2021-03-20 VITALS — BP 120/73 | HR 77 | Temp 98.4°F | Ht 65.0 in | Wt 139.8 lb

## 2021-03-20 DIAGNOSIS — F17210 Nicotine dependence, cigarettes, uncomplicated: Secondary | ICD-10-CM

## 2021-03-20 DIAGNOSIS — J189 Pneumonia, unspecified organism: Secondary | ICD-10-CM | POA: Diagnosis not present

## 2021-03-20 DIAGNOSIS — R69 Illness, unspecified: Secondary | ICD-10-CM | POA: Diagnosis not present

## 2021-03-20 DIAGNOSIS — F419 Anxiety disorder, unspecified: Secondary | ICD-10-CM

## 2021-03-20 DIAGNOSIS — F339 Major depressive disorder, recurrent, unspecified: Secondary | ICD-10-CM

## 2021-03-20 DIAGNOSIS — D692 Other nonthrombocytopenic purpura: Secondary | ICD-10-CM | POA: Diagnosis not present

## 2021-03-20 DIAGNOSIS — I10 Essential (primary) hypertension: Secondary | ICD-10-CM

## 2021-03-20 DIAGNOSIS — D696 Thrombocytopenia, unspecified: Secondary | ICD-10-CM

## 2021-03-20 DIAGNOSIS — C9110 Chronic lymphocytic leukemia of B-cell type not having achieved remission: Secondary | ICD-10-CM | POA: Diagnosis not present

## 2021-03-20 DIAGNOSIS — E7800 Pure hypercholesterolemia, unspecified: Secondary | ICD-10-CM

## 2021-03-20 DIAGNOSIS — J432 Centrilobular emphysema: Secondary | ICD-10-CM

## 2021-03-20 DIAGNOSIS — Z79899 Other long term (current) drug therapy: Secondary | ICD-10-CM

## 2021-03-20 DIAGNOSIS — I700 Atherosclerosis of aorta: Secondary | ICD-10-CM

## 2021-03-20 MED ORDER — LORAZEPAM 1 MG PO TABS: 0.5000 mg | ORAL_TABLET | Freq: Two times a day (BID) | ORAL | 4 refills | Status: DC | PRN

## 2021-03-20 NOTE — Assessment & Plan Note (Signed)
Chronic, ongoing.  Continue current medication regimen and collaboration with pulmonary as needed.  Recommend she avoid use of Benadryl due to age >21 and BEERS criteria, utilize Claritin instead.  Return to office in 6 weeks.

## 2021-03-20 NOTE — Assessment & Plan Note (Signed)
Noted on exam, recommend gentle skin care and monitoring for wounds, if wounds present immediately alert provider. 

## 2021-03-20 NOTE — Assessment & Plan Note (Signed)
Chronic, ongoing.  Continue current medication regimen and adjust as needed.  Lipid panel next visit.    

## 2021-03-20 NOTE — Assessment & Plan Note (Signed)
Chronic, stable with no SI/HI.  Continue Paxil at this time and consider transition in future to Sertraline due to age >48 and anticholinergic effects with Paxil.  Continue Ativan as needed, patient aware of risks with long term use.  Refills sent in.  UDS next visit, obtain controlled substance contract next visit.  Return in 6 weeks.

## 2021-03-20 NOTE — Assessment & Plan Note (Signed)
Chronic, under treatment with oncology.  Recent note and labs reviewed.  Continue collaboration with oncology provider.

## 2021-03-20 NOTE — Assessment & Plan Note (Signed)
Acute and improved at this time.  Reports no further symptoms.  Recent labs obtained by oncology and is scheduled for lung CT 04/08/21.  Will plan on follow-up in 6 weeks.

## 2021-03-20 NOTE — Progress Notes (Signed)
BP 120/73    Pulse 77    Temp 98.4 F (36.9 C) (Oral)    Ht 5\' 5"  (1.651 m)    Wt 139 lb 12.8 oz (63.4 kg)    SpO2 97%    BMI 23.26 kg/m    Subjective:    Patient ID: Katherine [name], female    DOB: 1940-09-21, 81 y.o.   MRN: [id number]  HPI: Katherine [name] is a 81 y.o. female  Chief Complaint  Patient presents with   Pneumonia    Patient is here for hospital follow up on Pneumonia. Patient states everything is going great. Patient states she still may have to lay down sometimes as she is tired.    Transition of Care Hospital Follow up.  Was initially admitted on 03/09/21 for pneumonia and then readmitted on the 19th for 24 hour observation.  She initially started feeling bad on 03/06/21, had been feeling bad -- was sleeping leaves and started feeling bad.  Her pneumonia was multifocal.  Did follow-up with oncology on 03/18/21 and had labs (CBC, CMP, LDH).    She reports overall feeling better now, "it is wonderful", is happy to be home in her own bed. No SOB, CP, wheezing, fever.  She does endorse a little coughing and swelling in the legs.  Reports when she swallows she has no difficulty with this and no know aspiration. Has never had an EGD.  She quit smoking 03/09/21.    HOSPITAL COURSE "Hypoxia, fever, fatigue   Multifocal Pneumonia. Continued improvement, weaned now and doing well off supplemental oxygen. Chest imaging with new consolidation and mild GGOs in RML consistent w/ lobar PNA, new left lower lobe consolidation with bronchial obstruction and volume loss consistent with likely atelectasis possibly due to aspiration. WBC improved. RPP negative for flu, RSV, COVID. Urine legionella Ag negative. Urine culture <50K, not indicative of infection. Speech evaluation showing no concern for aspiration. Sputum culture pending and NGTD, Bcx NGTD. She was treatd with IV antibiotics and switched to oral once weaned off oxygen. She completed 3.5 days of flagyl and will finish a 5 day  course. She, had completed 4 days of IV ceftriaxone but, unfortunately, missed a day of antibiotics so will complete 4 more days outpatient to complete 5 day course with cefdinir.   CLL, stable: Follows with [facility]. No longer on chemotherapy as she did not tolerate several different therapies 2/2 side effects. Per chart review, WBC ranges from 30-100. Malignant hematology consulted at [facility], did not recommend any inpatient changes to her plan for surveillance. Has close follow up with outpatient heme/onc Dr. Rogue Bussing (1/25).   Touchbase with Outpatient Provider: Warm Handoff: Completed on 03/13/21 by Rebecca Eaton (APP) via Phone  Procedures: None No admission procedures for hospital encounter. ______________________________________________________________________ Discharge Medications:  Your Medication List   STOP taking these medications  sodium chloride 0.9 % nebulizer solution   START taking these medications  acetaminophen 325 MG tablet Commonly known as: TYLENOL Take 2 tablets (650 mg total) by mouth every four (4) hours as needed.  cefdinir 300 MG capsule Commonly known as: OMNICEF Take 1 capsule (300 mg total) by mouth Two (2) times a day for 4 days.   CONTINUE taking these medications  ALBUTEROL INHL Inhale 2 puffs every six (6) hours as needed.  atorvastatin 10 MG tablet Commonly known as: LIPITOR Take 10 mg by mouth daily.  benazepriL 40 MG tablet Commonly known as: LOTENSIN Take 40 mg by mouth daily.  calcium-vitamin D 500 mg-5 mcg (200 unit) per tablet Take 1 tablet by mouth 2 (two) times a day with meals.  diphenhydrAMINE 25 mg capsule/tablet Commonly known as: BENADRYL Take 25 mg by mouth nightly as needed for itching.  LORazepam 1 MG tablet Commonly known as: ATIVAN Take 0.5 mg by mouth two (2) times a day as needed for anxiety.  metroNIDAZOLE 500 MG tablet Commonly known as: FLAGYL Take 1 tablet (500 mg total) by mouth Three (3) times a  day for 2 days.  multivitamin with minerals tablet Take 1 tablet by mouth daily.  PARoxetine 30 MG tablet Commonly known as: PAXIL Take 30 mg by mouth every morning.  ROBITUSSIN COUGH & COLD CF ORAL Take 5 mL by mouth continuous as needed.  triamcinolone 55 mcg nasal inhaler Commonly known as: NASACORT 2 sprays into each nostril daily.  vitamin E-670 mg (1000 UNIT) 670 mg (1000 UNIT) capsule Take 670 mg by mouth daily. "  Hospital/Facility: [facility] 24 hours D/C Physician: Dr. Vertell Limber D/C Date: 03/13/21  Records Requested: 03/20/21 Records Received: 03/20/21 Records Reviewed: 03/20/21  Diagnoses on Discharge: Multifocal pneumonia  Date of interactive Contact within 48 hours of discharge:  Contact was through: phone  Date of 7 day or 14 day face-to-face visit:    within 7 days  Outpatient Encounter Medications as of 03/20/2021  Medication Sig   acetaminophen (TYLENOL) 325 MG tablet Take by mouth.   albuterol (VENTOLIN HFA) 108 (90 Base) MCG/ACT inhaler TAKE 2 PUFFS BY MOUTH EVERY 6 HOURS AS NEEDED FOR WHEEZE OR SHORTNESS OF BREATH   atorvastatin (LIPITOR) 10 MG tablet Take 1 tablet (10 mg total) by mouth daily.   benazepril (LOTENSIN) 40 MG tablet Take 1 tablet (40 mg total) by mouth daily.   Multiple Vitamins-Minerals (MULTIVITAMIN WITH MINERALS) tablet Take 1 tablet by mouth daily.   PARoxetine (PAXIL) 30 MG tablet Take 1 tablet (30 mg total) by mouth daily.   Spacer/Aero-Holding Chambers (OPTICHAMBER DIAMOND) MISC 1 EACH BY OTHER ROUTE ONCE FOR 1 DOSE.   vitamin E 1000 UNIT capsule Take by mouth.   [DISCONTINUED] diphenhydrAMINE (BENADRYL) 25 MG tablet Take 25 mg by mouth at bedtime as needed.   [DISCONTINUED] LORazepam (ATIVAN) 1 MG tablet Take 0.5 tablets (0.5 mg total) by mouth 2 (two) times daily as needed for anxiety.   [START ON 04/08/2021] LORazepam (ATIVAN) 1 MG tablet Take 0.5 tablets (0.5 mg total) by mouth 2 (two) times daily as needed for anxiety.   [DISCONTINUED]  LORazepam (ATIVAN) 1 MG tablet Take by mouth.   No facility-administered encounter medications on file as of 03/20/2021.    Diagnostic Tests Reviewed/Disposition: Reviewed recent labs in chart  Consults: None  Discharge Instructions: Follow-up with PCP  Disease/illness Education: Reviewed with patient  Home Health/Community Services Discussions/Referrals: None  Establishment or re-establishment of referral orders for community resources: None  Discussion with other health care providers: Reviewed in chart  Assessment and Support of treatment regimen adherence:Reviewed with patient  Appointments Coordinated with: Reviewed with patient  Education for self-management, independent living, and ADLs:  Reviewed with patient  HYPERLIPIDEMIA/HTN Continues on Atorvastatin 10 MG + Benazepril.  Has underlying CLL and is followed closely by oncology with recent labs done by them, last visit 03/18/21.   Recently quit smoking.  Has smoked for several years. Has underlying emphysema and aortic atherosclerosis noted on past imaging and minimally uses her Albuterol. Hyperlipidemia status: good compliance Satisfied with current treatment?  yes Side effects:  no Medication compliance:  good compliance Supplements: none Aspirin:  no The ASCVD Risk score (Arnett DK, et al., 2019) failed to calculate for the following reasons:   The 2019 ASCVD risk score is only valid for ages 39 to 6 Chest pain:  no Coronary artery disease:  no Family history CAD:  yes Family history early CAD:  no    ANXIETY/STRESS Continues on Paxil 30 MG daily + Ativan 0.5 MG BID PRN -- often takes twice a day.  Pt is aware of risks of benzo medication use to include increased sedation, respiratory suppression, falls, dependence and cardiovascular events.  Pt would like to continue treatment as benefit determined to outweigh risk.  PDMP review last filled 03/08/21.     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   Duration:controlled Anxious mood: yes, in afternoon on occasion Excessive worrying: yes, occasional Irritability: no  Sweating: no Nausea: no Palpitations:no Hyperventilation: no Panic attacks: no Agoraphobia: no  Obscessions/compulsions: no Depressed mood: no Depression screen [facility] 2/9 03/20/2021 11/12/2020 08/06/2020 02/05/2020 07/11/2019  Decreased Interest 0 0 0 3 -  Down, Depressed, Hopeless 0 0 0 0 -  PHQ - 2 Score 0 0 0 3 -  Altered sleeping 0 0 0 0 0  Tired, decreased energy 0 1 0 3 1  Change in appetite 0 0 0 0 0  Feeling bad or failure about yourself  0 0 0 0 0  Trouble concentrating 0 0 0 0 0  Moving slowly or fidgety/restless 0 0 0 0 0  Suicidal thoughts 0 0 0 0 0  PHQ-9 Score 0 1 0 6 -  Difficult doing work/chores - Not difficult at all - - -  Some recent data might be hidden  Anhedonia: no Weight changes: no Insomnia: none Hypersomnia: no Fatigue/loss of energy: no Feelings of worthlessness: no Feelings of guilt: no Impaired concentration/indecisiveness: no Suicidal ideations: no  Crying spells: no Recent Stressors/Life Changes: no   Relationship problems: no   Family stress: no     Financial stress: no    Job stress: no    Recent death/loss: no  Relevant past medical, surgical, family and social history reviewed and updated as indicated. Interim medical history since our last visit reviewed. Allergies and medications reviewed and updated.  Review of Systems  Constitutional:  Positive for fatigue. Negative for activity change, appetite change, chills and fever.  HENT: Negative.    Respiratory:  Positive for cough. Negative for chest tightness, shortness of breath and wheezing.   Cardiovascular:  Positive for leg swelling. Negative for chest pain and palpitations.  Gastrointestinal: Negative.   Neurological: Negative.   Psychiatric/Behavioral: Negative.     Per HPI unless specifically indicated above      Objective:    BP 120/73    Pulse 77    Temp 98.4 F (36.9 C) (Oral)    Ht 5\' 5"  (1.651 m)    Wt 139 lb 12.8 oz (63.4 kg)    SpO2 97%    BMI 23.26 kg/m   Wt Readings from Last 3 Encounters:  03/20/21 139 lb 12.8 oz (63.4 kg)  03/18/21 140 lb 12.8 oz (63.9 kg)  01/13/21 144 lb (65.3 kg)    Physical Exam Vitals and nursing note reviewed.  Constitutional:      General: She is awake. She is not in acute distress.    Appearance: She is well-developed and well-groomed. She is not ill-appearing  or toxic-appearing.  HENT:     Head: Normocephalic.     Right Ear: Hearing normal.     Left Ear: Hearing normal.  Eyes:     General: Lids are normal.        Right eye: No discharge.        Left eye: No discharge.     Conjunctiva/sclera: Conjunctivae normal.     Pupils: Pupils are equal, round, and reactive to light.  Neck:     Thyroid: No thyromegaly.     Vascular: No carotid bruit.  Cardiovascular:     Rate and Rhythm: Normal rate and regular rhythm.     Heart sounds: Normal heart sounds. No murmur heard.   No gallop.  Pulmonary:     Effort: Pulmonary effort is normal. No accessory muscle usage or respiratory distress.     Breath sounds: Normal breath sounds.  Abdominal:     General: Bowel sounds are normal.     Palpations: Abdomen is soft.  Musculoskeletal:     Cervical back: Normal range of motion and neck supple.     Right lower leg: No edema.     Left lower leg: No edema.  Lymphadenopathy:     Cervical: No cervical adenopathy.  Skin:    General: Skin is warm and dry.     Comments: Scattered pale bruising to upper extremities.  Neurological:     Mental Status: She is alert and oriented to person, place, and time.     Deep Tendon Reflexes: Reflexes are normal and symmetric.     Reflex Scores:      Brachioradialis reflexes are 2+ on the right side and 2+ on the left side.      Patellar reflexes are 2+ on the right side and 2+ on the left side. Psychiatric:        Attention  and Perception: Attention normal.        Mood and Affect: Mood normal.        Speech: Speech normal.        Behavior: Behavior normal. Behavior is cooperative.        Thought Content: Thought content normal.    Results for orders placed or performed in visit on 03/18/21  Lactate dehydrogenase  Result Value Ref Range   LDH 167 98 - 192 U/L  Comprehensive metabolic panel  Result Value Ref Range   Sodium 141 135 - 145 mmol/L   Potassium 4.9 3.5 - 5.1 mmol/L   Chloride 103 98 - 111 mmol/L   CO2 30 22 - 32 mmol/L   Glucose, Bld 105 (H) 70 - 99 mg/dL   BUN 10 8 - 23 mg/dL   Creatinine, Ser 0.69 0.44 - 1.00 mg/dL   Calcium 9.3 8.9 - 10.3 mg/dL   Total Protein 6.5 6.5 - 8.1 g/dL   Albumin 4.1 3.5 - 5.0 g/dL   AST 27 15 - 41 U/L   ALT 23 0 - 44 U/L   Alkaline Phosphatase 86 38 - 126 U/L   Total Bilirubin 0.3 0.3 - 1.2 mg/dL   GFR, Estimated >60 >60 mL/min   Anion gap 8 5 - 15  CBC with Differential/Platelet  Result Value Ref Range   WBC 108.9 (HH) 4.0 - 10.5 K/uL   RBC 3.99 3.87 - 5.11 MIL/uL   Hemoglobin 13.1 12.0 - 15.0 g/dL   HCT 41.0 36.0 - 46.0 %   MCV 102.8 (H) 80.0 - 100.0 fL   MCH 32.8 26.0 - 34.0  pg   MCHC 32.0 30.0 - 36.0 g/dL   RDW 13.5 11.5 - 15.5 %   Platelets 290 150 - 400 K/uL   nRBC 0.0 0.0 - 0.2 %   Neutrophils Relative % 4 %   Neutro Abs 4.3 1.7 - 7.7 K/uL   Lymphocytes Relative 94 %   Lymphs Abs 102.1 (H) 0.7 - 4.0 K/uL   Monocytes Relative 2 %   Monocytes Absolute 2.1 (H) 0.1 - 1.0 K/uL   Eosinophils Relative 0 %   Eosinophils Absolute 0.2 0.0 - 0.5 K/uL   Basophils Relative 0 %   Basophils Absolute 0.1 0.0 - 0.1 K/uL   WBC Morphology ABSOLUTE LYMPHOCYTOSIS    RBC Morphology MORPHOLOGY UNREMARKABLE    Smear Review Normal platelet morphology    Immature Granulocytes 0 %   Abs Immature Granulocytes 0.20 (H) 0.00 - 0.07 K/uL      Assessment & Plan:   Problem List Items Addressed This Visit       Cardiovascular and Mediastinum   Atherosclerosis  of aorta ([place])    Noted on imaging in past, recommend complete cessation of smoking and continue daily statin for prevention.      Essential hypertension    Chronic, ongoing with BP at goal today for age.  Continue current medication regimen and adjust as needed.  Recommend she monitor BP at least a few mornings a week at home and document.  DASH diet at home.  CMP up to date with oncology, reviewed.          Senile purpura ([place])    Noted on exam, recommend gentle skin care and monitoring for wounds, if wounds present immediately alert provider.        Respiratory   Centrilobular emphysema (HCC)    Chronic, ongoing.  Continue current medication regimen and collaboration with pulmonary as needed.  Recommend she avoid use of Benadryl due to age >58 and BEERS criteria, utilize Claritin instead.  Return to office in 6 weeks.       Community acquired pneumonia - Primary    Acute and improved at this time.  Reports no further symptoms.  Recent labs obtained by oncology and is scheduled for lung CT 04/08/21.  Will plan on follow-up in 6 weeks.        Hematopoietic and Hemostatic   Thrombocytopenia ([place])    With underlying CLL, followed by oncology.  Continue this collaboration.        Other   Anxiety    Chronic, stable with no SI/HI.  Continue Paxil at this time and consider transition in future to Sertraline due to age >68 and anticholinergic effects with Paxil.  Continue Ativan as needed, patient aware of risks with long term use.  Refills sent in.  UDS next visit, obtain controlled substance contract next visit.  Return in 6 weeks.      Relevant Medications   LORazepam (ATIVAN) 1 MG tablet (Start on 04/08/2021)   CLL (chronic lymphocytic leukemia) (HCC)    Chronic, under treatment with oncology.  Recent note and labs reviewed.  Continue collaboration with oncology provider.        Relevant Medications   acetaminophen (TYLENOL) 325 MG tablet   LORazepam (ATIVAN) 1 MG tablet  (Start on 04/08/2021)   Depression, recurrent (HCC)    Chronic, stable with no SI/HI.  Continue Paxil at this time and consider transition in future to Sertraline due to age >74 and anticholinergic effects with Paxil.  Continue Ativan as needed,  patient aware of risks with long term use.  Refills sent in.  UDS next visit, obtain controlled substance contract next visit.  Return in 6 weeks.      Relevant Medications   LORazepam (ATIVAN) 1 MG tablet (Start on 04/08/2021)   Hypercholesterolemia    Chronic, ongoing.  Continue current medication regimen and adjust as needed.  Lipid panel next visit.       Long-term current use of benzodiazepine    Long term, patient aware of risks with long term use.  UDS next visit and controlled substance contract next visit.      Nicotine dependence, cigarettes, uncomplicated    Recently quit, recommend continued cessation.        Follow up plan: Return in about 6 weeks (around 05/01/2021) for Pneumonia.

## 2021-03-20 NOTE — Assessment & Plan Note (Signed)
With underlying CLL, followed by oncology.  Continue this collaboration. 

## 2021-03-20 NOTE — Assessment & Plan Note (Addendum)
Chronic, ongoing with BP at goal today for age.  Continue current medication regimen and adjust as needed.  Recommend she monitor BP at least a few mornings a week at home and document.  DASH diet at home.  CMP up to date with oncology, reviewed.

## 2021-03-20 NOTE — Assessment & Plan Note (Addendum)
Chronic, stable with no SI/HI.  Continue Paxil at this time and consider transition in future to Sertraline due to age >2 and anticholinergic effects with Paxil.  Continue Ativan as needed, patient aware of risks with long term use.  Refills sent in.  UDS next visit, obtain controlled substance contract next visit.  Return in 6 weeks.

## 2021-03-20 NOTE — Assessment & Plan Note (Signed)
Recently quit, recommend continued cessation. 

## 2021-03-20 NOTE — Assessment & Plan Note (Signed)
Noted on imaging in past, recommend complete cessation of smoking and continue daily statin for prevention.

## 2021-03-20 NOTE — Assessment & Plan Note (Signed)
Long term, patient aware of risks with long term use.  UDS next visit and controlled substance contract next visit.

## 2021-03-20 NOTE — Patient Instructions (Signed)
Community-Acquired Pneumonia, Adult °Pneumonia is an infection of the lungs. It causes irritation and swelling in the airways of the lungs. Mucus and fluid may also build up inside the airways. This may cause coughing and trouble breathing. °One type of pneumonia can happen while you are in a hospital. A different type can happen when you are not in a hospital (community-acquired pneumonia). °What are the causes? °This condition is caused by germs (viruses, bacteria, or fungi). Some types of germs can spread from person to person. Pneumonia is not thought to spread from person to person. °What increases the risk? °You are more likely to develop this condition if: °You have a long-term (chronic) disease, such as: °Disease of the lungs. This may be chronic obstructive pulmonary disease (COPD) or asthma. °Heart failure. °Cystic fibrosis. °Diabetes. °Kidney disease. °Sickle cell disease. °HIV. °You have other health problems, such as: °Your body's defense system (immune system) is weak. °A condition that may cause you to breathe in fluids from your mouth and nose. °You had your spleen taken out. °You do not take good care of your teeth and mouth (poor dental hygiene). °You use or have used tobacco products. °You travel where the germs that cause this illness are common. °You are near certain animals or the places they live. °You are older than 81 years of age. °What are the signs or symptoms? °Symptoms of this condition include: °A cough. °A fever. °Sweating or chills. °Chest pain, often when you breathe deeply or cough. °Breathing problems, such as: °Fast breathing. °Trouble breathing. °Shortness of breath. °Feeling tired (fatigued). °Muscle aches. °How is this treated? °Treatment for this condition depends on many things, such as: °The cause of your illness. °Your medicines. °Your other health problems. °Most adults can be treated at home. Sometimes, treatment must happen in a hospital. °Treatment may include  medicines to kill germs. °Medicines may depend on which germ caused your illness. °Very bad pneumonia is rare. If you get it, you may: °Have a machine to help you breathe. °Have fluid taken away from around your lungs. °Follow these instructions at home: °Medicines °Take over-the-counter and prescription medicines only as told by your doctor. °Take cough medicine only if you are losing sleep. Cough medicine can keep your body from taking mucus away from your lungs. °If you were prescribed an antibiotic medicine, take it as told by your doctor. Do not stop taking the antibiotic even if you start to feel better. °Lifestyle °  °Do not drink alcohol. °Do not use any products that contain nicotine or tobacco, such as cigarettes, e-cigarettes, and chewing tobacco. If you need help quitting, ask your doctor. °Eat a healthy diet. This includes a lot of vegetables, fruits, whole grains, low-fat dairy products, and low-fat (lean) protein. °General instructions ° °Rest a lot. Sleep for at least 8 hours each night. °Sleep with your head and neck raised. Put a few pillows under your head or sleep in a reclining chair. °Return to your normal activities as told by your doctor. Ask your doctor what activities are safe for you. °Drink enough fluid to keep your pee (urine) pale yellow. °If your throat is sore, rinse your mouth often with salt water. To make salt water, dissolve ½-1 tsp (3-6 g) of salt in 1 cup (237 mL) of warm water. °Keep all follow-up visits as told by your doctor. This is important. °How is this prevented? °You can lower your risk of pneumonia by: °Getting the pneumonia shot (vaccine). These shots have different   types and schedules. Ask your doctor what works best for you. Think about getting this shot if: °You are older than 81 years of age. °You are 19-65 years of age and: °You are being treated for cancer. °You have long-term lung disease. °You have other problems that affect your body's defense system. Ask  your doctor if you have one of these. °Getting your flu shot every year. Ask your doctor which type of shot is best for you. °Going to the dentist as often as told. °Washing your hands often with soap and water for at least 20 seconds. If you cannot use soap and water, use hand sanitizer. °Contact a doctor if: °You have a fever. °You lose sleep because your cough medicine does not help. °Get help right away if: °You are short of breath and this gets worse. °You have more chest pain. °Your sickness gets worse. This is very serious if: °You are an older adult. °Your body's defense system is weak. °You cough up blood. °These symptoms may be an emergency. Do not wait to see if the symptoms will go away. Get medical help right away. Call your local emergency services (911 in the [place] Do not drive yourself to the hospital. °Summary °Pneumonia is an infection of the lungs. °Community-acquired pneumonia affects people who have not been in the hospital. Certain germs can cause this infection. °This condition may be treated with medicines that kill germs. °For very bad pneumonia, you may need a hospital stay and treatment to help with breathing. °This information is not intended to replace advice given to you by your health care provider. Make sure you discuss any questions you have with your health care provider. °Document Revised: 11/21/2018 Document Reviewed: 11/21/2018 °Elsevier Patient Education © 2022 [employer] ° °

## 2021-04-03 ENCOUNTER — Telehealth: Payer: Self-pay | Admitting: Nurse Practitioner

## 2021-04-03 NOTE — Telephone Encounter (Signed)
Pt scheduled 3/10

## 2021-04-03 NOTE — Telephone Encounter (Signed)
Copied from [place] [id number]. Topic: General - Other >> Apr 03, 2021 11:41 AM Katherine [name] wrote: Reason for CRM: Pt called in stating she is just getting over Pneumonia and still having a cough and coughing up mucus. Pt was requesting if she could get some medication to help, please advise.

## 2021-04-03 NOTE — Telephone Encounter (Signed)
Please call and schedule the patient an appointment per Jolene.

## 2021-04-03 NOTE — Telephone Encounter (Signed)
Routing to provider to advise.  

## 2021-04-07 ENCOUNTER — Inpatient Hospital Stay: Payer: Medicare HMO | Admitting: Nurse Practitioner

## 2021-04-08 ENCOUNTER — Ambulatory Visit
Admission: RE | Admit: 2021-04-08 | Discharge: 2021-04-08 | Disposition: A | Discharge status: Home / Self Care | Payer: Medicare HMO | Source: Ambulatory Visit | Attending: Internal Medicine | Admitting: Internal Medicine

## 2021-04-08 ENCOUNTER — Inpatient Hospital Stay: Payer: Medicare HMO | Admitting: Nurse Practitioner

## 2021-04-08 ENCOUNTER — Other Ambulatory Visit: Payer: Self-pay

## 2021-04-08 DIAGNOSIS — J189 Pneumonia, unspecified organism: Secondary | ICD-10-CM | POA: Insufficient documentation

## 2021-04-08 DIAGNOSIS — C9110 Chronic lymphocytic leukemia of B-cell type not having achieved remission: Secondary | ICD-10-CM | POA: Diagnosis not present

## 2021-04-08 DIAGNOSIS — I700 Atherosclerosis of aorta: Secondary | ICD-10-CM | POA: Diagnosis not present

## 2021-04-08 MED ORDER — IOHEXOL 300 MG/ML  SOLN
75.0000 mL | Freq: Once | INTRAMUSCULAR | Status: AC | PRN
  Administered 2021-04-08: 75 mL via INTRAVENOUS

## 2021-04-09 NOTE — Progress Notes (Signed)
I spoke to patient regarding the results of the CT scan. Patient denies any significant shortness of breath or cough. Patient will keep appointment as planned for tomorrow. Will make pulmonary referral tomorrow.

## 2021-04-09 NOTE — Progress Notes (Signed)
I left a voicemail for the patient to call us back to discuss the results of the CT scan that was done yesterday.

## 2021-04-10 ENCOUNTER — Other Ambulatory Visit: Payer: Self-pay | Admitting: *Deleted

## 2021-04-10 ENCOUNTER — Other Ambulatory Visit: Payer: Self-pay

## 2021-04-10 ENCOUNTER — Inpatient Hospital Stay: Payer: Medicare HMO | Attending: Internal Medicine

## 2021-04-10 ENCOUNTER — Encounter: Payer: Self-pay | Admitting: Internal Medicine

## 2021-04-10 ENCOUNTER — Inpatient Hospital Stay: Payer: Medicare HMO | Admitting: Internal Medicine

## 2021-04-10 DIAGNOSIS — Z8701 Personal history of pneumonia (recurrent): Secondary | ICD-10-CM | POA: Diagnosis not present

## 2021-04-10 DIAGNOSIS — K8020 Calculus of gallbladder without cholecystitis without obstruction: Secondary | ICD-10-CM | POA: Insufficient documentation

## 2021-04-10 DIAGNOSIS — I2510 Atherosclerotic heart disease of native coronary artery without angina pectoris: Secondary | ICD-10-CM | POA: Diagnosis not present

## 2021-04-10 DIAGNOSIS — R0981 Nasal congestion: Secondary | ICD-10-CM | POA: Insufficient documentation

## 2021-04-10 DIAGNOSIS — C9110 Chronic lymphocytic leukemia of B-cell type not having achieved remission: Secondary | ICD-10-CM | POA: Insufficient documentation

## 2021-04-10 DIAGNOSIS — F17210 Nicotine dependence, cigarettes, uncomplicated: Secondary | ICD-10-CM | POA: Insufficient documentation

## 2021-04-10 DIAGNOSIS — I700 Atherosclerosis of aorta: Secondary | ICD-10-CM | POA: Insufficient documentation

## 2021-04-10 DIAGNOSIS — Z9221 Personal history of antineoplastic chemotherapy: Secondary | ICD-10-CM | POA: Diagnosis not present

## 2021-04-10 DIAGNOSIS — M7989 Other specified soft tissue disorders: Secondary | ICD-10-CM | POA: Insufficient documentation

## 2021-04-10 DIAGNOSIS — J9819 Other pulmonary collapse: Secondary | ICD-10-CM | POA: Diagnosis not present

## 2021-04-10 DIAGNOSIS — J189 Pneumonia, unspecified organism: Secondary | ICD-10-CM

## 2021-04-10 DIAGNOSIS — Z803 Family history of malignant neoplasm of breast: Secondary | ICD-10-CM | POA: Insufficient documentation

## 2021-04-10 DIAGNOSIS — R69 Illness, unspecified: Secondary | ICD-10-CM | POA: Diagnosis not present

## 2021-04-10 LAB — COMPREHENSIVE METABOLIC PANEL
ALT: 16 U/L (ref 0–44)
AST: 22 U/L (ref 15–41)
Albumin: 4.7 g/dL (ref 3.5–5.0)
Alkaline Phosphatase: 95 U/L (ref 38–126)
Anion gap: 8 (ref 5–15)
BUN: 12 mg/dL (ref 8–23)
CO2: 30 mmol/L (ref 22–32)
Calcium: 9.3 mg/dL (ref 8.9–10.3)
Chloride: 100 mmol/L (ref 98–111)
Creatinine, Ser: 0.69 mg/dL (ref 0.44–1.00)
GFR, Estimated: >60 mL/min (ref >60)
Glucose, Bld: 85 mg/dL (ref 70–99)
Potassium: 4.3 mmol/L (ref 3.5–5.1)
Sodium: 138 mmol/L (ref 135–145)
Total Bilirubin: 0.8 mg/dL (ref 0.3–1.2)
Total Protein: 6.9 g/dL (ref 6.5–8.1)

## 2021-04-10 LAB — CBC WITH DIFFERENTIAL/PLATELET
Abs Immature Granulocytes: 0.11 10*3/uL — ABNORMAL HIGH (ref 0.00–0.07)
Basophils Absolute: 0.1 10*3/uL (ref 0.0–0.1)
Basophils Relative: 0 %
Eosinophils Absolute: 0.3 10*3/uL (ref 0.0–0.5)
Eosinophils Relative: 0 %
HCT: 42.4 % (ref 36.0–46.0)
Hemoglobin: 13.7 g/dL (ref 12.0–15.0)
Immature Granulocytes: 0 %
Lymphocytes Relative: 95 %
Lymphs Abs: 99.3 10*3/uL — ABNORMAL HIGH (ref 0.7–4.0)
MCH: 32.9 pg (ref 26.0–34.0)
MCHC: 32.3 g/dL (ref 30.0–36.0)
MCV: 101.9 fL — ABNORMAL HIGH (ref 80.0–100.0)
Monocytes Absolute: 2.1 10*3/uL — ABNORMAL HIGH (ref 0.1–1.0)
Monocytes Relative: 2 %
Neutro Abs: 3.3 10*3/uL (ref 1.7–7.7)
Neutrophils Relative %: 3 %
Platelets: 102 10*3/uL — ABNORMAL LOW (ref 150–400)
RBC: 4.16 MIL/uL (ref 3.87–5.11)
RDW: 14.3 % (ref 11.5–15.5)
Smear Review: NORMAL
WBC: 105.1 10*3/uL (ref 4.0–10.5)
nRBC: 0 % (ref 0.0–0.2)

## 2021-04-10 LAB — LACTATE DEHYDROGENASE: LDH: 153 U/L (ref 98–192)

## 2021-04-10 NOTE — Assessment & Plan Note (Addendum)
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 Deletion-poor tolerance to Ibrutinib; and Acalburtinib [stopped in March 2022 because of diarrhea].  Patient's white count today is 108; normal hemoglobin/platelets.   #From a clinical standpoint patient's CLL is stable-White count is 105; hemoglobin 14 platelets slightly low at 102.  Recommend continue to monitor/surveillance given patient's poor tolerance to therapies.   #Pneumonia -bilateral Jan 2023 [smoker; [facility]]-CT scan February 15- Complete collapse of the right middle lobe and left lower lobe either from nonvisualized endobronchial lesions or retained secretions.  Recommend evaluation with pulmonary.  Patient reluctantly agrees.  Discussed with Hildred Alamin.   #Incidental findings on Imaging  CT Chest 2023: Aortic atherosclerosis left main and three-vessel coronary artery disease; Splenomegaly [related to CLL];  Cholelithiasis. I reviewed/discussed/counseled the patient.   # DISPOSITION:  # referral to pulmonary [facility] QQ:IWLNL middle/lower lung collapse # Follow up in 1 month;MD; labs- cbc/CMP;LDH;--Dr.B   # I reviewed the blood work- with the patient in detail; also reviewed the imaging independently [as summarized above]; and with the patient in detail.

## 2021-04-10 NOTE — Progress Notes (Signed)
Per secure chat from Dr. Rogue Bussing, pt needs referral to pulmonary [facility] - Dr.Gonzalez TF:TDDUK middle/lower lung collapse ASAP  Referral has been placed and their office will contact pt with appt.

## 2021-04-10 NOTE — Progress Notes (Signed)
[facility] NOTE  Patient Care Team: Katherine Lick, NP as PCP - General (Nurse Practitioner) Katherine Maple, MD as PCP - Family Medicine (Family Medicine) Katherine Rack, MD (Dermatology) Katherine Silvas, MD (Inactive) (Gastroenterology) Katherine Castilla Forest Gleason, MD (General Surgery) Katherine Sickle, MD as Consulting Physician (Oncology)  CHIEF COMPLAINTS/PURPOSE OF CONSULTATION:  CLL  #  Oncology History Overview Note  Katherine [name] is a 81 y.o. female with chronic lymphocytic leukemia (CLL).  WBC has ranged between 22,000 - 101,7000 since 05/2011.   She has received Rituxan x 2 four week cycles (06/27/2015 and 05/27/2017).  Hepatitis B surface antigen and hepatitis B surface antibody were negative on 07/04/2015.   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  CCND1, ATM, chromosome 12, and TP53 were normal.     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  There was a CD5 and CD23 positive monoclonal B cell population with lambda light chain restriction, negative for FMC7 and CD38, representing  88% of leukocytes, >5,000/uL. There was no loss of, or aberrant expression of, the pan T cell  antigens to  suggest a neoplastic T cell process. CD4:CD8 ratio 2.0  No circulating blasts were detected. There was no immunophenotypic evidence of abnormal myeloid maturation.  IGH/BCL2 by FISH is pending. --------------------------------------------------------  She began ibrutinib on 04/24/2019 (discontinued on 05/30/2019).                     She developed blood-streaked sputum (slight) then significant hematuria.                     She declined further ibrutinib. ---------------------------------------------------------------------------------            She began acalabrutinib on 03/14/2020 (held on 05/13/2020 secondary to  diarrhea). --------------------------------------------------------------------------------------   She has a 35 pack year smoking history.  She is in the low dose chest CT program.  Low dose chest CT on 02/02/2018 revealed a new endobronchial lesion in the segmental bronchus to the medial segment of the right middle lobe. This may simply reflect an area of retained secretions, however, the possibility of an endobronchial neoplasm should be considered. Lung-RADS 4AS, suspicious.  Low dose chest CT on 07/05/2018 revealed Lung-RADS 2, benign appearance or behavior.  Prior right middle lobe endobronchial lesion/mucous plugging was no longer visualized.  New mucous plugging in the right lower lobe.   CLL (chronic lymphocytic leukemia) ([place])  05/28/2015 Initial Diagnosis   CLL (chronic lymphocytic leukemia) ([facility])    HISTORY OF PRESENTING ILLNESS: Frail-appearing Caucasian female patient.  Accompanied by her daughter.  Katherine [name] 81 y.o.  female CLL-13 q. Deletion currently on surveillance because of intolerance/side effects to multiple  TKIs is here for follow-up/review results of the CT scan.  Patient continues to feel overall better.  She complains of nasal congestion/sinus drainage.  Otherwise denies any hemoptysis of worsening cough.  Chronic mild swelling in the legs.  No blood in stools or black or stools.  Denies any new lumps or bumps.  Review of Systems  Constitutional:  Positive for malaise/fatigue. Negative for chills, diaphoresis, fever and weight loss.  HENT:  Negative for nosebleeds and sore throat.   Eyes:  Negative for double vision.  Respiratory:  Negative for cough, hemoptysis, sputum production, shortness of breath and wheezing.   Cardiovascular:  Negative for  chest pain, palpitations, orthopnea and leg swelling.  Gastrointestinal:  Negative for abdominal pain, blood in stool, constipation, diarrhea, heartburn, melena, nausea and vomiting.  Genitourinary:  Negative for  dysuria, frequency and urgency.  Musculoskeletal:  Positive for back pain. Negative for joint pain.  Skin: Negative.  Negative for itching and rash.  Neurological:  Negative for dizziness, tingling, focal weakness, weakness and headaches.  Endo/Heme/Allergies:  Does not bruise/bleed easily.  Psychiatric/Behavioral:  Negative for depression. The patient is not nervous/anxious and does not have insomnia.     MEDICAL HISTORY:  Past Medical History:  Diagnosis Date   Allergy    Anxiety    Depression    GERD (gastroesophageal reflux disease)    Hemorrhoids    Hypertension    Leukemia, lymphoid ([place])    CLL   Lobar pneumonia (H[facility])    Osteopenia    Personal history of chemotherapy     SURGICAL HISTORY: Past Surgical History:  Procedure Laterality Date   ABDOMINAL HYSTERECTOMY     APPENDECTOMY     BREAST BIOPSY Left    bx x 3-neg   COLON SURGERY     sigmoid resection   COLONOSCOPY     2007, 2012   COLONOSCOPY WITH PROPOFOL N/A 09/17/2015   Procedure: COLONOSCOPY WITH PROPOFOL;  Surgeon: Robert Bellow, MD;  Location: [facility] ENDOSCOPY;  Service: Endoscopy;  Laterality: N/A;   OOPHORECTOMY     SPINE SURGERY     L4-5    SOCIAL HISTORY: Social History   Socioeconomic History   Marital status: Married    Spouse name: Not on file   Number of children: Not on file   Years of education: 12   Highest education level: 12th grade  Occupational History   Not on file  Tobacco Use   Smoking status: Every Day    Packs/day: 1.00    Years: 35.00    Pack years: 35.00    Types: Cigarettes   Smokeless tobacco: Never   Tobacco comments:    5 cigarettes daily--01/22/2020  Vaping Use   Vaping Use: Never used  Substance and Sexual Activity   Alcohol use: No    Alcohol/week: 0.0 standard drinks   Drug use: No   Sexual activity: Not on file  Other Topics Concern   Not on file  Social History Narrative   Not on file   Social Determinants of Health   Financial Resource Strain:  Not on file  Food Insecurity: Not on file  Transportation Needs: Not on file  Physical Activity: Not on file  Stress: Not on file  Social Connections: Not on file  Intimate Partner Violence: Not on file    FAMILY HISTORY: Family History  Problem Relation Age of Onset   Osteoporosis Mother    Hypertension Father    Heart attack Father    Stroke Maternal Grandfather    Breast cancer Sister 25    ALLERGIES:  is allergic to augmentin [amoxicillin-pot clavulanate] and biaxin [clarithromycin].  MEDICATIONS:  Current Outpatient Medications  Medication Sig Dispense Refill   acetaminophen (TYLENOL) 325 MG tablet Take by mouth.     albuterol (VENTOLIN HFA) 108 (90 Base) MCG/ACT inhaler TAKE 2 PUFFS BY MOUTH EVERY 6 HOURS AS NEEDED FOR WHEEZE OR SHORTNESS OF BREATH 108 g 9   atorvastatin (LIPITOR) 10 MG tablet Take 1 tablet (10 mg total) by mouth daily. 90 tablet 4   benazepril (LOTENSIN) 40 MG tablet Take 1 tablet (40 mg total) by mouth daily. 90 tablet  4   LORazepam (ATIVAN) 1 MG tablet Take 0.5 tablets (0.5 mg total) by mouth 2 (two) times daily as needed for anxiety. 60 tablet 4   Multiple Vitamins-Minerals (MULTIVITAMIN WITH MINERALS) tablet Take 1 tablet by mouth daily.     PARoxetine (PAXIL) 30 MG tablet Take 1 tablet (30 mg total) by mouth daily. 90 tablet 4   Spacer/Aero-Holding Chambers (OPTICHAMBER DIAMOND) MISC 1 EACH BY OTHER ROUTE ONCE FOR 1 DOSE.     vitamin E 1000 UNIT capsule Take by mouth.     No current facility-administered medications for this visit.      Marland Kitchen  PHYSICAL EXAMINATION: ECOG PERFORMANCE STATUS: 1 - Symptomatic but completely ambulatory  Vitals:   04/10/21 0928  BP: 124/63  Pulse: 73  Temp: (!) 97.4 F (36.3 C)  SpO2: 97%   Filed Weights   04/10/21 0928  Weight: 141 lb 6.4 oz (64.1 kg)    Physical Exam HENT:     Head: Normocephalic and atraumatic.     Mouth/Throat:     Pharynx: No oropharyngeal exudate.  Eyes:     Pupils: Pupils are  equal, round, and reactive to light.  Cardiovascular:     Rate and Rhythm: Normal rate and regular rhythm.  Pulmonary:     Effort: No respiratory distress.     Breath sounds: No wheezing.     Comments: Decreased air entry bilaterally at the bases. Abdominal:     General: Bowel sounds are normal. There is no distension.     Palpations: Abdomen is soft. There is no mass.     Tenderness: There is no abdominal tenderness. There is no guarding or rebound.  Musculoskeletal:        General: No tenderness. Normal range of motion.     Cervical back: Normal range of motion and neck supple.  Skin:    General: Skin is warm.  Neurological:     Mental Status: She is alert and oriented to person, place, and time.  Psychiatric:        Mood and Affect: Affect normal.     LABORATORY DATA:  I have reviewed the data as listed Lab Results  Component Value Date   WBC 105.1 (HH) 04/10/2021   HGB 13.7 04/10/2021   HCT 42.4 04/10/2021   MCV 101.9 (H) 04/10/2021   PLT 102 (L) 04/10/2021   Recent Labs    01/13/21 0931 03/18/21 0946 04/10/21 0912  NA 138 141 138  K 4.6 4.9 4.3  CL 103 103 100  CO2 _0 GLUCOSE 114* 105* 85  BUN _1 CREATININE 0.83 0.69 0.69  CALCIUM 9.0 9.3 9.3  GFRNONAA >60 >60 >60  PROT 6.1* 6.5 6.9  ALBUMIN 4.2 4.1 4.7  AST _2 ALT _3 ALKPHOS 96 86 95  BILITOT 0.4 0.3 0.8    RADIOGRAPHIC STUDIES: I have personally reviewed the radiological images as listed and agreed with the findings in the report. CT CHEST W CONTRAST  Result Date: 04/09/2021 CLINICAL DATA:  81 year old female with history of pneumonia. EXAM: CT CHEST WITH CONTRAST TECHNIQUE: Multidetector CT imaging of the chest was performed during intravenous contrast administration. RADIATION DOSE REDUCTION: This exam was performed according to the departmental dose-optimization program which includes automated exposure control, adjustment of the mA and/or kV according to patient size  and/or use of iterative reconstruction technique. CONTRAST:  6m OMNIPAQUE IOHEXOL 300 MG/ML  SOLN COMPARISON:  Chest CT 01/09/2020. FINDINGS: Cardiovascular:  Heart size is normal. There is no significant pericardial fluid, thickening or pericardial calcification. There is aortic atherosclerosis, as well as atherosclerosis of the great vessels of the mediastinum and the coronary arteries, including calcified atherosclerotic plaque in the left main, left anterior descending, left circumflex and right coronary arteries. Mediastinum/Nodes: No pathologically enlarged mediastinal or hilar lymph nodes. Esophagus is unremarkable in appearance. No axillary lymphadenopathy. Lungs/Pleura: There is complete collapse of both the right middle and left lower lobes. Abrupt cut off of both the right middle lobe and left lower lobe bronchi is noted. No clear obstructing mass identified. Whether this represents endobronchial lesions obstructing the lumen or retained secretions is uncertain. However, there are retained secretions elsewhere in the lungs, most notably in the right lower lobe and left upper lobe endobronchial trees. No definite consolidative airspace disease. No pleural effusions. No definite suspicious appearing pulmonary nodules or masses are noted. Upper Abdomen: Aortic atherosclerosis. Calcified gallstone incompletely imaged in the neck of the gallbladder. Spleen is incompletely imaged but appears clearly enlarged, measuring at least 14.1 x 7.7 cm on axial images. Musculoskeletal: There are no aggressive appearing lytic or blastic lesions noted in the visualized portions of the skeleton. IMPRESSION: 1. Complete collapse of the right middle lobe and left lower lobe either from nonvisualized endobronchial lesions or retained secretions. Outpatient referral to Pulmonology for further clinical evaluation and potential bronchoscopy is recommended. 2. Aortic atherosclerosis, in addition to left main and three-vessel  coronary artery disease. 3. Splenomegaly. 4. Cholelithiasis. Aortic Atherosclerosis (ICD10-I70.0). Electronically Signed   By: Vinnie Langton M.D.   On: 04/09/2021 06:00    ASSESSMENT & PLAN:   CLL (chronic lymphocytic leukemia) (HCC) #CLL-Rai stage II; FISH 13 q. Deletion-poor tolerance to Ibrutinib; and Acalburtinib [stopped in March 2022 because of diarrhea].  Patient's white count today is 108; normal hemoglobin/platelets.   #From a clinical standpoint patient's CLL is stable-White count is 105; hemoglobin 14 platelets slightly low at 102.  Recommend continue to monitor/surveillance given patient's poor tolerance to therapies.   #Pneumonia -bilateral Jan 2023 [smoker; [facility]]-CT scan February 15- Complete collapse of the right middle lobe and left lower lobe either from nonvisualized endobronchial lesions or retained secretions.  Recommend evaluation with pulmonary.  Patient reluctantly agrees.  Discussed with Hildred Alamin.    #Incidental findings on Imaging  CT Chest 2023: Aortic atherosclerosis left main and three-vessel coronary artery disease; Splenomegaly [related to CLL];  Cholelithiasis. I reviewed/discussed/counseled the patient.   # DISPOSITION:  # referral to pulmonary [facility] PN:TIRWE middle/lower lung collapse # Follow up in 1 month;MD; labs- cbc/CMP;LDH;--Dr.B   All questions were answered. The patient knows to call the clinic with any problems, questions or concerns.   Katherine Sickle, MD 04/10/2021 4:57 PM

## 2021-04-17 LAB — IMMUNOGLOBULINS A/E/G/M, SERUM
IgA: 14 mg/dL — ABNORMAL LOW (ref 64–422)
IgE (Immunoglobulin E), Serum: <2 IU/mL — ABNORMAL LOW (ref 6–495)
IgG (Immunoglobin G), Serum: 184 mg/dL — ABNORMAL LOW (ref 586–1602)
IgM (Immunoglobulin M), Srm: <5 mg/dL — ABNORMAL LOW (ref 26–217)

## 2021-04-19 ENCOUNTER — Other Ambulatory Visit: Payer: Self-pay | Admitting: Internal Medicine

## 2021-04-19 DIAGNOSIS — D8989 Other specified disorders involving the immune mechanism, not elsewhere classified: Secondary | ICD-10-CM | POA: Insufficient documentation

## 2021-04-20 ENCOUNTER — Encounter: Payer: Self-pay | Admitting: Internal Medicine

## 2021-04-20 NOTE — Progress Notes (Signed)
I called the patient unable to reach. Recommend IV I G infusions to cut down the risk of recurrent infections. Recommend patient call us back to discuss further.

## 2021-04-21 NOTE — Progress Notes (Signed)
I spoke to patient regarding the results of the low antibodies/risk of recurrent pneumonia. Recommend immunoglobulin infusion. Patient want it on Wednesday/Thursday/Friday.  C- we will need to reschedule the patient appointment with [place]. Please check with me in the morning tomorrow.

## 2021-04-22 ENCOUNTER — Telehealth: Payer: Self-pay

## 2021-04-22 NOTE — Telephone Encounter (Signed)
Done pt aware °

## 2021-04-22 NOTE — Telephone Encounter (Signed)
Message received from MD:  Please move the patient's appointment to next week-MD labs; IVIG infusion low dose. ? ?Please schedule and  inform patient of appt details.   ?

## 2021-05-01 ENCOUNTER — Ambulatory Visit: Payer: Medicare HMO | Admitting: Nurse Practitioner

## 2021-05-01 ENCOUNTER — Inpatient Hospital Stay (HOSPITAL_BASED_OUTPATIENT_CLINIC_OR_DEPARTMENT_OTHER): Payer: Medicare HMO | Admitting: Internal Medicine

## 2021-05-01 ENCOUNTER — Inpatient Hospital Stay: Payer: Medicare HMO | Attending: Internal Medicine

## 2021-05-01 ENCOUNTER — Inpatient Hospital Stay: Payer: Medicare HMO

## 2021-05-01 ENCOUNTER — Encounter: Payer: Self-pay | Admitting: Internal Medicine

## 2021-05-01 ENCOUNTER — Other Ambulatory Visit: Payer: Self-pay

## 2021-05-01 VITALS — BP 142/65 | HR 66 | Temp 97.8°F | Resp 20

## 2021-05-01 DIAGNOSIS — C9110 Chronic lymphocytic leukemia of B-cell type not having achieved remission: Secondary | ICD-10-CM

## 2021-05-01 DIAGNOSIS — Z8701 Personal history of pneumonia (recurrent): Secondary | ICD-10-CM | POA: Diagnosis not present

## 2021-05-01 DIAGNOSIS — D849 Immunodeficiency, unspecified: Secondary | ICD-10-CM | POA: Insufficient documentation

## 2021-05-01 LAB — CBC WITH DIFFERENTIAL/PLATELET
Abs Immature Granulocytes: 0 10*3/uL (ref 0.00–0.07)
Band Neutrophils: 0 %
Basophils Absolute: 0 10*3/uL (ref 0.0–0.1)
Basophils Relative: 0 %
Blasts: 0 %
Eosinophils Absolute: 1 10*3/uL — ABNORMAL HIGH (ref 0.0–0.5)
Eosinophils Relative: 1 %
HCT: 41.4 % (ref 36.0–46.0)
Hemoglobin: 13.3 g/dL (ref 12.0–15.0)
Lymphocytes Relative: 95 %
Lymphs Abs: 96.9 10*3/uL — ABNORMAL HIGH (ref 0.7–4.0)
MCH: 32.7 pg (ref 26.0–34.0)
MCHC: 32.1 g/dL (ref 30.0–36.0)
MCV: 101.7 fL — ABNORMAL HIGH (ref 80.0–100.0)
Metamyelocytes Relative: 0 %
Monocytes Absolute: 2 10*3/uL — ABNORMAL HIGH (ref 0.1–1.0)
Monocytes Relative: 2 %
Myelocytes: 0 %
Neutro Abs: 2 10*3/uL (ref 1.7–7.7)
Neutrophils Relative %: 2 %
Other: 0 %
Platelets: 125 10*3/uL — ABNORMAL LOW (ref 150–400)
Promyelocytes Relative: 0 %
RBC: 4.07 MIL/uL (ref 3.87–5.11)
RDW: 13.7 % (ref 11.5–15.5)
Smear Review: DECREASED
WBC: 101.9 10*3/uL (ref 4.0–10.5)
nRBC: 0 % (ref 0.0–0.2)
nRBC: 0 /100 WBC

## 2021-05-01 LAB — COMPREHENSIVE METABOLIC PANEL
ALT: 12 U/L (ref 0–44)
AST: 19 U/L (ref 15–41)
Albumin: 4.3 g/dL (ref 3.5–5.0)
Alkaline Phosphatase: 100 U/L (ref 38–126)
Anion gap: 5 (ref 5–15)
BUN: 12 mg/dL (ref 8–23)
CO2: 30 mmol/L (ref 22–32)
Calcium: 9.1 mg/dL (ref 8.9–10.3)
Chloride: 102 mmol/L (ref 98–111)
Creatinine, Ser: 0.69 mg/dL (ref 0.44–1.00)
GFR, Estimated: >60 mL/min (ref >60)
Glucose, Bld: 94 mg/dL (ref 70–99)
Potassium: 3.9 mmol/L (ref 3.5–5.1)
Sodium: 137 mmol/L (ref 135–145)
Total Bilirubin: 1.1 mg/dL (ref 0.3–1.2)
Total Protein: 6.5 g/dL (ref 6.5–8.1)

## 2021-05-01 LAB — LACTATE DEHYDROGENASE: LDH: 128 U/L (ref 98–192)

## 2021-05-01 MED ORDER — DIPHENHYDRAMINE HCL 25 MG PO CAPS
25.0000 mg | ORAL_CAPSULE | Freq: Once | ORAL | Status: AC
  Administered 2021-05-01: 25 mg via ORAL
  Filled 2021-05-01: qty 1

## 2021-05-01 MED ORDER — DEXTROSE 5 % IV SOLN
Freq: Once | INTRAVENOUS | Status: AC
  Filled 2021-05-01: qty 250

## 2021-05-01 MED ORDER — IMMUNE GLOBULIN (HUMAN) 5 GM/100ML IV SOLN
25.0000 g | Freq: Once | INTRAVENOUS | Status: AC
  Administered 2021-05-01: 25 g via INTRAVENOUS
  Filled 2021-05-01: qty 200

## 2021-05-01 MED ORDER — ACETAMINOPHEN 325 MG PO TABS
650.0000 mg | ORAL_TABLET | Freq: Once | ORAL | Status: AC
  Administered 2021-05-01: 650 mg via ORAL
  Filled 2021-05-01: qty 2

## 2021-05-01 NOTE — Assessment & Plan Note (Addendum)
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 Deletion-poor tolerance to Ibrutinib; and Acalburtinib [stopped in March 2022 because of diarrhea].  Patient's white count today is 101; normal hemoglobin/platelets.  ? ?#From a clinical standpoint patient's CLL is stable-White count is 101; hemoglobin 14 platelets slightly low at 125.  Recommend continue to monitor/surveillance given patient's poor tolerance to therapies. ? 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 Monthly-3 to 4 infusions. Discussed re: infusion reactions; headaches, TE phenomena etc. Benefits outweigh the risks.  ? ?#Pneumonia -bilateral Jan 2023 [smoker; [facility]]-CT scan February 15- Complete collapse of the right middle lobe and left lower lobe either from nonvisualized endobronchial lesions or retained secretions.  Awaiting pulmonary evaluation on 4/05. . ? ?# Skin lesions- ? SCC Left UE-refer to Dr.Graham ??  ?# DISPOSITION:  ?# IVG infusion ?# refer to Dr.Graham re: skin cancer/ Left UE ?# Follow up in 5 weeks ;MD; labs- cbc/CMP;LDH;IVIG infusion -Dr.B  ? ?

## 2021-05-01 NOTE — Patient Instructions (Addendum)
Immune Globulin Injection What is this medication? IMMUNE GLOBULIN (im MUNE  GLOB yoo lin) helps to prevent or reduce the severity of certain infections in patients who are at risk. This medicine is collected from the pooled blood of many donors. It is used to treat immune system problems, thrombocytopenia, and Kawasaki syndrome. This medicine may be used for other purposes; ask your health care provider or pharmacist if you have questions. COMMON BRAND NAME(S): ASCENIV, Baygam, BIVIGAM, Carimune, Carimune NF, cutaquig, Cuvitru, Flebogamma, Flebogamma DIF, GamaSTAN, GamaSTAN S/D, Gamimune N, Gammagard, Gammagard S/D, Gammaked, Gammaplex, Gammar-P IV, Gamunex, Gamunex-C, Hizentra, Iveegam, Iveegam EN, Octagam, Panglobulin, Panglobulin NF, panzyga, Polygam S/D, Privigen, Sandoglobulin, Venoglobulin-S, Vigam, Vivaglobulin, Xembify What should I tell my care team before I take this medication? They need to know if you have any of these conditions: diabetes extremely low or no immune antibodies in the blood heart disease history of blood clots hyperprolinemia infection in the blood, sepsis kidney disease recently received or scheduled to receive a vaccination an unusual or allergic reaction to human immune globulin, albumin, maltose, sucrose, other medicines, foods, dyes, or preservatives pregnant or trying to get pregnant breast-feeding How should I use this medication? This medicine is for injection into a muscle or infusion into a vein or skin. It is usually given by a health care professional in a hospital or clinic setting. In rare cases, some brands of this medicine might be given at home. You will be taught how to give this medicine. Use exactly as directed. Take your medicine at regular intervals. Do not take your medicine more often than directed. Talk to your pediatrician regarding the use of this medicine in children. While this drug may be prescribed for selected conditions, precautions  do apply. Overdosage: If you think you have taken too much of this medicine contact a poison control center or emergency room at once. NOTE: This medicine is only for you. Do not share this medicine with others. What if I miss a dose? It is important not to miss your dose. Call your doctor or health care professional if you are unable to keep an appointment. If you give yourself the medicine and you miss a dose, take it as soon as you can. If it is almost time for your next dose, take only that dose. Do not take double or extra doses. What may interact with this medication? aspirin and aspirin-like medicines cisplatin cyclosporine medicines for infection like acyclovir, adefovir, amphotericin B, bacitracin, cidofovir, foscarnet, ganciclovir, gentamicin, pentamidine, vancomycin NSAIDS, medicines for pain and inflammation, like ibuprofen or naproxen pamidronate vaccines zoledronic acid This list may not describe all possible interactions. Give your health care provider a list of all the medicines, herbs, non-prescription drugs, or dietary supplements you use. Also tell them if you smoke, drink alcohol, or use illegal drugs. Some items may interact with your medicine. What should I watch for while using this medication? Your condition will be monitored carefully while you are receiving this medicine. This medicine is made from pooled blood donations of many different people. It may be possible to pass an infection in this medicine. However, the donors are screened for infections and all products are tested for HIV and hepatitis. The medicine is treated to kill most or all bacteria and viruses. Talk to your doctor about the risks and benefits of this medicine. Do not have vaccinations for at least 14 days before, or until at least 3 months after receiving this medicine. What side effects may I notice  from receiving this medication? Side effects that you should report to your doctor or health care  professional as soon as possible: allergic reactions like skin rash, itching or hives, swelling of the face, lips, or tongue blue colored lips or skin breathing problems chest pain or tightness fever signs and symptoms of aseptic meningitis such as stiff neck; sensitivity to light; headache; drowsiness; fever; nausea; vomiting; rash signs and symptoms of a blood clot such as chest pain; shortness of breath; pain, swelling, or warmth in the leg signs and symptoms of hemolytic anemia such as fast heartbeat; tiredness; dark yellow or brown urine; or yellowing of the eyes or skin signs and symptoms of kidney injury like trouble passing urine or change in the amount of urine sudden weight gain swelling of the ankles, feet, hands Side effects that usually do not require medical attention (report to your doctor or health care professional if they continue or are bothersome): diarrhea flushing headache increased sweating joint pain muscle cramps muscle pain nausea pain, redness, or irritation at site where injected tiredness This list may not describe all possible side effects. Call your doctor for medical advice about side effects. You may report side effects to FDA at 1-800-FDA-1088. Where should I keep my medication? Keep out of the reach of children. This drug is usually given in a hospital or clinic and will not be stored at home. In rare cases, some brands of this medicine may be given at home. If you are using this medicine at home, you will be instructed on how to store this medicine. Throw away any unused medicine after the expiration date on the label. NOTE: This sheet is a summary. It may not cover all possible information. If you have questions about this medicine, talk to your doctor, pharmacist, or health care provider.  2022 Elsevier/Gold Standard (2018-09-13 00:00:00) [facility] [place] ONCOLOGY  Discharge Instructions: Thank you for choosing [facility] to provide your oncology and hematology care.  If you have a lab appointment with the [facility], please go directly to the [facility] and check in at the registration area.  Wear comfortable clothing and clothing appropriate for easy access to any Portacath or PICC line.   We strive to give you quality time with your provider. You may need to reschedule your appointment if you arrive late (15 or more minutes).  Arriving late affects you and other patients whose appointments are after yours.  Also, if you miss three or more appointments without notifying the office, you may be dismissed from the clinic at the providers discretion.      For prescription refill requests, have your pharmacy contact our office and allow 72 hours for refills to be completed.    Today you received the following chemotherapy and/or immunotherapy agents: IVIG      To help prevent nausea and vomiting after your treatment, we encourage you to take your nausea medication as directed.  BELOW ARE SYMPTOMS THAT SHOULD BE REPORTED IMMEDIATELY: *FEVER GREATER THAN 100.4 F (38 C) OR HIGHER *CHILLS OR SWEATING *NAUSEA AND VOMITING THAT IS NOT CONTROLLED WITH YOUR NAUSEA MEDICATION *UNUSUAL SHORTNESS OF BREATH *UNUSUAL BRUISING OR BLEEDING *URINARY PROBLEMS (pain or burning when urinating, or frequent urination) *BOWEL PROBLEMS (unusual diarrhea, constipation, pain near the anus) TENDERNESS IN MOUTH AND THROAT WITH OR WITHOUT PRESENCE OF ULCERS (sore throat, sores in mouth, or a toothache) UNUSUAL RASH, SWELLING OR PAIN  UNUSUAL VAGINAL DISCHARGE OR ITCHING   Items  with * indicate a potential emergency and should be followed up as soon as possible or go to the Emergency Department if any problems should occur.  Please show the CHEMOTHERAPY ALERT CARD or IMMUNOTHERAPY ALERT CARD at check-in to the Emergency Department and triage nurse.  Should you have questions after your visit or need to cancel or  reschedule your appointment, please contact [facility] [place] [facility] [place]  [phone] and follow the prompts.  Office hours are 8:00 a.m. to 4:30 p.m. Monday - Friday. Please note that voicemails left after 4:00 p.m. may not be returned until the following business day.  We are closed weekends and major holidays. You have access to a nurse at all times for urgent questions. Please call the main number to the clinic [phone] and follow the prompts.  For any non-urgent questions, you may also contact your provider using MyChart. We now offer e-Visits for anyone 28 and older to request care online for non-urgent symptoms. For details visit [email or web address].   Also download the MyChart app! Go to the app store, search "MyChart", open the app, select [facility], and log in with your MyChart username and password.  Due to Covid, a mask is required upon entering the hospital/clinic. If you do not have a mask, one will be given to you upon arrival. For doctor visits, patients may have 1 support person aged 25 or older with them. For treatment visits, patients cannot have anyone with them due to current Covid guidelines and our immunocompromised population.

## 2021-05-01 NOTE — Progress Notes (Signed)
Patient denies new problems/concerns today.   °

## 2021-05-01 NOTE — Progress Notes (Signed)
[facility] NOTE  [name] Care Team: Katherine Lick, NP as PCP - General (Nurse Practitioner) Katherine Maple, MD as PCP - Family Medicine (Family Medicine) Katherine Rack, MD (Dermatology) Katherine Silvas, MD (Inactive) (Gastroenterology) Katherine [name], Katherine Gleason, MD (General Surgery) Katherine Sickle, MD as Consulting Physician (Oncology) Katherine Sickle, MD as Consulting Physician (Internal Medicine)  CHIEF COMPLAINTS/PURPOSE OF CONSULTATION:  CLL  #  Oncology History Overview Note  Katherine [name] is a 81 y.o. female with chronic lymphocytic leukemia (CLL).  WBC has ranged between 22,000 - 101,7000 since 05/2011.   She has received Rituxan x 2 four week cycles (06/27/2015 and 05/27/2017).  Hepatitis B surface antigen and hepatitis B surface antibody were negative on 07/04/2015.   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  CCND1, ATM, chromosome 12, and TP53 were normal.     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  There was a CD5 and CD23 positive monoclonal B cell population with lambda light chain restriction, negative for FMC7 and CD38, representing  88% of leukocytes, >5,000/uL. There was no loss of, or aberrant expression of, the pan T cell  antigens to  suggest a neoplastic T cell process. CD4:CD8 ratio 2.0  No circulating blasts were detected. There was no immunophenotypic evidence of abnormal myeloid maturation.  IGH/BCL2 by FISH is pending. --------------------------------------------------------  She began ibrutinib on 04/24/2019 (discontinued on 05/30/2019).                     She developed blood-streaked sputum (slight) then significant hematuria.                     She declined further ibrutinib. ---------------------------------------------------------------------------------            She  began acalabrutinib on 03/14/2020 (held on 05/13/2020 secondary to diarrhea). --------------------------------------------------------------------------------------   She has a 35 pack year smoking history.  She is in the low dose chest CT program.  Low dose chest CT on 02/02/2018 revealed a new endobronchial lesion in the segmental bronchus to the medial segment of the right middle lobe. This may simply reflect an area of retained secretions, however, the possibility of an endobronchial neoplasm should be considered. Lung-RADS 4AS, suspicious.  Low dose chest CT on 07/05/2018 revealed Lung-RADS 2, benign appearance or behavior.  Prior right middle lobe endobronchial lesion/mucous plugging was no longer visualized.  New mucous plugging in the right lower lobe.  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  Accompanied by her daughter.  Katherine [name] 81 y.o.  female CLL-13 q. Deletion currently on surveillance because of intolerance/side effects to multiple  TKIs is here for follow-up/proceed with IVIG infusion given recent pneumonia.  [name] continues to feel overall better.  Denies any worsening cough or shortness of breath.  Chronic mild swelling in the legs.  No blood in stools or black or stools.  Denies any new lumps or bumps.  Complains of a plaque-like lesion on the left upper extremity-catching her c clothes.  Been there for many months.  No bleeding.  Review of Systems  Constitutional:  Positive for malaise/fatigue.  Negative for chills, diaphoresis, fever and weight loss.  HENT:  Negative for nosebleeds and sore throat.   Eyes:  Negative for double vision.  Respiratory:  Negative for cough,  hemoptysis, sputum production, shortness of breath and wheezing.   Cardiovascular:  Negative for chest pain, palpitations, orthopnea and leg swelling.  Gastrointestinal:  Negative for abdominal pain, blood in stool, constipation, diarrhea, heartburn, melena, nausea and vomiting.  Genitourinary:  Negative for dysuria, frequency and urgency.  Musculoskeletal:  Positive for back pain. Negative for joint pain.  Skin: Negative.  Negative for itching and rash.  Neurological:  Negative for dizziness, tingling, focal weakness, weakness and headaches.  Endo/Heme/Allergies:  Does not bruise/bleed easily.  Psychiatric/Behavioral:  Negative for depression. The [name] is not nervous/anxious and does not have insomnia.     MEDICAL HISTORY:  Past Medical History:  Diagnosis Date   Allergy    Anxiety    Depression    GERD (gastroesophageal reflux disease)    Hemorrhoids    Hypertension    Leukemia, lymphoid ([place])    CLL   Lobar pneumonia (HCC)    Osteopenia    Personal history of chemotherapy     SURGICAL HISTORY: Past Surgical History:  Procedure Laterality Date   ABDOMINAL HYSTERECTOMY     APPENDECTOMY     BREAST BIOPSY Left    bx x 3-neg   COLON SURGERY     sigmoid resection   COLONOSCOPY     2007, 2012   COLONOSCOPY WITH PROPOFOL N/A 09/17/2015   Procedure: COLONOSCOPY WITH PROPOFOL;  Surgeon: Robert Bellow, MD;  Location: [facility] ENDOSCOPY;  Service: Endoscopy;  Laterality: N/A;   OOPHORECTOMY     SPINE SURGERY     L4-5    SOCIAL HISTORY: Social History   Socioeconomic History   Marital status: Married    Spouse name: Not on file   Number of children: Not on file   Years of education: 12   Highest education level: 12th grade  Occupational History   Not on file  Tobacco Use   Smoking status: Every Day    Packs/day: 1.00    Years: 35.00    Pack years: 35.00    Types: Cigarettes   Smokeless tobacco: Never   Tobacco comments:    5 cigarettes daily--01/22/2020  Vaping  Use   Vaping Use: Never used  Substance and Sexual Activity   Alcohol use: No    Alcohol/week: 0.0 standard drinks   Drug use: No   Sexual activity: Not on file  Other Topics Concern   Not on file  Social History Narrative   Not on file   Social Determinants of Health   Financial Resource Strain: Not on file  Food Insecurity: Not on file  Transportation Needs: Not on file  Physical Activity: Not on file  Stress: Not on file  Social Connections: Not on file  Intimate Partner Violence: Not on file    FAMILY HISTORY: Family History  Problem Relation Age of Onset   Osteoporosis Mother    Hypertension Father    Heart attack Father    Stroke Maternal Grandfather    Breast cancer Sister 67    ALLERGIES:  is allergic to augmentin [amoxicillin-pot clavulanate] and biaxin [clarithromycin].  MEDICATIONS:  Current Outpatient Medications  Medication Sig Dispense Refill   acetaminophen (TYLENOL) 325 MG tablet Take by mouth.     albuterol (VENTOLIN HFA) 108 (90 Base) MCG/ACT inhaler TAKE 2 PUFFS BY MOUTH EVERY 6 HOURS AS NEEDED FOR  WHEEZE OR SHORTNESS OF BREATH 108 g 9   atorvastatin (LIPITOR) 10 MG tablet Take 1 tablet (10 mg total) by mouth daily. 90 tablet 4   benazepril (LOTENSIN) 40 MG tablet Take 1 tablet (40 mg total) by mouth daily. 90 tablet 4   LORazepam (ATIVAN) 1 MG tablet Take 0.5 tablets (0.5 mg total) by mouth 2 (two) times daily as needed for anxiety. 60 tablet 4   Multiple Vitamins-Minerals (MULTIVITAMIN WITH MINERALS) tablet Take 1 tablet by mouth daily.     PARoxetine (PAXIL) 30 MG tablet Take 1 tablet (30 mg total) by mouth daily. 90 tablet 4   Spacer/Aero-Holding Chambers (OPTICHAMBER DIAMOND) MISC 1 EACH BY OTHER ROUTE ONCE FOR 1 DOSE.     vitamin C (ASCORBIC ACID) 250 MG tablet Take 250 mg by mouth daily.     vitamin E 1000 UNIT capsule Take by mouth.     No current facility-administered medications for this visit.      Marland Kitchen  PHYSICAL EXAMINATION: ECOG  PERFORMANCE STATUS: 1 - Symptomatic but completely ambulatory  Vitals:   05/01/21 0800  BP: 136/78  Pulse: 83  Temp: (!) 97.2 F (36.2 C)   Filed Weights   05/01/21 0800  Weight: 142 lb 12.8 oz (64.8 kg)    Physical Exam HENT:     Head: Normocephalic and atraumatic.     Mouth/Throat:     Pharynx: No oropharyngeal exudate.  Eyes:     Pupils: Pupils are equal, round, and reactive to light.  Cardiovascular:     Rate and Rhythm: Normal rate and regular rhythm.  Pulmonary:     Effort: No respiratory distress.     Breath sounds: No wheezing.     Comments: Decreased air entry bilaterally at the bases. Abdominal:     General: Bowel sounds are normal. There is no distension.     Palpations: Abdomen is soft. There is no mass.     Tenderness: There is no abdominal tenderness. There is no guarding or rebound.  Musculoskeletal:        General: No tenderness. Normal range of motion.     Cervical back: Normal range of motion and neck supple.  Skin:    General: Skin is warm.  Neurological:     Mental Status: She is alert and oriented to person, place, and time.  Psychiatric:        Mood and Affect: Affect normal.     LABORATORY DATA:  I have reviewed the data as listed Lab Results  Component Value Date   WBC 101.9 (HH) 05/01/2021   HGB 13.3 05/01/2021   HCT 41.4 05/01/2021   MCV 101.7 (H) 05/01/2021   PLT 125 (L) 05/01/2021   Recent Labs    03/18/21 0946 04/10/21 0912 05/01/21 0824  NA 141 138 137  K 4.9 4.3 3.9  CL 103 100 102  CO2 _0 GLUCOSE 105* 85 94  BUN _1 CREATININE 0.69 0.69 0.69  CALCIUM 9.3 9.3 9.1  GFRNONAA >60 >60 >60  PROT 6.5 6.9 6.5  ALBUMIN 4.1 4.7 4.3  AST _2 ALT _3 ALKPHOS 86 95 100  BILITOT 0.3 0.8 1.1    RADIOGRAPHIC STUDIES: I have personally reviewed the radiological images as listed and agreed with the findings in the report. CT CHEST W CONTRAST  Result Date: 04/09/2021 CLINICAL DATA:  81 year old female  with history of pneumonia. EXAM: CT CHEST WITH CONTRAST TECHNIQUE: Multidetector CT imaging  of the chest was performed during intravenous contrast administration. RADIATION DOSE REDUCTION: This exam was performed according to the departmental dose-optimization program which includes automated exposure control, adjustment of the mA and/or kV according to [name] size and/or use of iterative reconstruction technique. CONTRAST:  72m OMNIPAQUE IOHEXOL 300 MG/ML  SOLN COMPARISON:  Chest CT 01/09/2020. FINDINGS: Cardiovascular: Heart size is normal. There is no significant pericardial fluid, thickening or pericardial calcification. There is aortic atherosclerosis, as well as atherosclerosis of the great vessels of the mediastinum and the coronary arteries, including calcified atherosclerotic plaque in the left main, left anterior descending, left circumflex and right coronary arteries. Mediastinum/Nodes: No pathologically enlarged mediastinal or hilar lymph nodes. Esophagus is unremarkable in appearance. No axillary lymphadenopathy. Lungs/Pleura: There is complete collapse of both the right middle and left lower lobes. Abrupt cut off of both the right middle lobe and left lower lobe bronchi is noted. No clear obstructing mass identified. Whether this represents endobronchial lesions obstructing the lumen or retained secretions is uncertain. However, there are retained secretions elsewhere in the lungs, most notably in the right lower lobe and left upper lobe endobronchial trees. No definite consolidative airspace disease. No pleural effusions. No definite suspicious appearing pulmonary nodules or masses are noted. Upper Abdomen: Aortic atherosclerosis. Calcified gallstone incompletely imaged in the neck of the gallbladder. Spleen is incompletely imaged but appears clearly enlarged, measuring at least 14.1 x 7.7 cm on axial images. Musculoskeletal: There are no aggressive appearing lytic or blastic lesions noted in the  visualized portions of the skeleton. IMPRESSION: 1. Complete collapse of the right middle lobe and left lower lobe either from nonvisualized endobronchial lesions or retained secretions. Outpatient referral to Pulmonology for further clinical evaluation and potential bronchoscopy is recommended. 2. Aortic atherosclerosis, in addition to left main and three-vessel coronary artery disease. 3. Splenomegaly. 4. Cholelithiasis. Aortic Atherosclerosis (ICD10-I70.0). Electronically Signed   By: DVinnie LangtonM.D.   On: 04/09/2021 06:00    ASSESSMENT & PLAN:   CLL (chronic lymphocytic leukemia) (HCC) #CLL-Rai stage II; FISH 13 q. Deletion-poor tolerance to Ibrutinib; and Acalburtinib [stopped in March 2022 because of diarrhea].  [name]'s white count today is 101; normal hemoglobin/platelets.   #From a clinical standpoint [name]'s CLL is stable-White count is 101; hemoglobin 14 platelets slightly low at 125.  Recommend continue to monitor/surveillance given [name]'s poor tolerance to therapies.  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 Monthly-3 to 4 infusions. Discussed re: infusion reactions; headaches, TE phenomena etc. Benefits outweigh the risks.   #Pneumonia -bilateral Jan 2023 [smoker; [facility]]-CT scan February 15- Complete collapse of the right middle lobe and left lower lobe either from nonvisualized endobronchial lesions or retained secretions.  Awaiting pulmonary evaluation on 4/05. .  # Skin lesions- ? SCC Left UE-refer to Dr.Graham    # DISPOSITION:  # IVG infusion # refer to Dr.Graham re: skin cancer/ Left UE # Follow up in 5 weeks ;MD; labs- cbc/CMP;LDH;IVIG infusion -Dr.B    All questions were answered. The [name] knows to call the clinic with any problems, questions or concerns.   GCammie Sickle MD 05/01/2021 5:03 PM

## 2021-05-06 ENCOUNTER — Telehealth: Payer: Self-pay | Admitting: *Deleted

## 2021-05-06 NOTE — Telephone Encounter (Signed)
Patient would like her appointments re-scheduled. ?

## 2021-05-08 ENCOUNTER — Ambulatory Visit: Payer: Medicare HMO | Admitting: Internal Medicine

## 2021-05-08 ENCOUNTER — Other Ambulatory Visit: Payer: Medicare HMO

## 2021-05-09 DIAGNOSIS — Z6741 Type O blood, Rh negative: Secondary | ICD-10-CM | POA: Insufficient documentation

## 2021-05-09 NOTE — Patient Instructions (Signed)
COPD and Physical Activity Chronic obstructive pulmonary disease (COPD) is a long-term, or chronic, condition that affects the lungs. COPD is a general term that can be used to describe many problems that cause inflammation of the lungs and limit airflow. These conditions include chronic bronchitis and emphysema. The main symptom of COPD is shortness of breath, which makes it harder to do even simple tasks. This can also make it harder to exercise and stay active. Talk with your health care provider about treatments to help you breathe better and actions you can take to prevent breathing problems during physical activity. What are the benefits of exercising when you have COPD? Exercising regularly is an important part of a healthy lifestyle. You can still exercise and do physical activities even though you have COPD. Exercise and physical activity improve your shortness of breath by increasing blood flow (circulation). This causes your heart to pump more oxygen through your body. Moderate exercise can: Improve oxygen use. Increase your energy level. Help with shortness of breath. Strengthen your breathing muscles. Improve heart health. Help with sleep. Improve your self-esteem and feelings of self-worth. Lower depression, stress, and anxiety. Exercise can benefit everyone with COPD. The severity of your disease may affect how hard you can exercise, especially at first, but everyone can benefit. Talk with your health care provider about how much exercise is safe for you, and which activities and exercises are safe for you. What actions can I take to prevent breathing problems during physical activity? Sign up for a pulmonary rehabilitation program. This type of program may include: Education about lung diseases. Exercise classes that teach you how to exercise and be more active while improving your breathing. This usually involves: Exercise using your lower extremities, such as a stationary  bicycle. About 30 minutes of exercise, 2 to 5 times per week, for 6 to 12 weeks. Strength training, such as push-ups or leg lifts. Nutrition education. Group classes in which you can talk with others who also have COPD and learn ways to manage stress. If you use an oxygen tank, you should use it while you exercise. Work with your health care provider to adjust your oxygen for your physical activity. Your resting flow rate is different from your flow rate during physical activity. How to manage your breathing while exercising While you are exercising: Take slow breaths. Pace yourself, and do nottry to go too fast. Purse your lips while breathing out. Pursing your lips is similar to a kissing or whistling position. If doing exercise that uses a quick burst of effort, such as weight lifting: Breathe in before starting the exercise. Breathe out during the hardest part of the exercise, such as raising the weights. Where to find support You can find support for exercising with COPD from: Your health care provider. A pulmonary rehabilitation program. Your local health department or community health programs. Support groups, either online or in-person. Your health care provider may be able to recommend support groups. Where to find more information You can find more information about exercising with COPD from: American Lung Association: lung.org COPD Foundation: copdfoundation.org Contact a health care provider if: Your symptoms get worse. You have nausea. You have a fever. You want to start a new exercise program or a new activity. Get help right away if: You have chest pain. You cannot breathe. These symptoms may represent a serious problem that is an emergency. Do not wait to see if the symptoms will go away. Get medical help right away. Call   your local emergency services (911 in the [place] Do not drive yourself to the hospital. Summary COPD is a general term that can be used to describe  many different lung problems that cause lung inflammation and limit airflow. This includes chronic bronchitis and emphysema. Exercise and physical activity improve your shortness of breath by increasing blood flow (circulation). This causes your heart to provide more oxygen to your body. Contact your health care provider before starting any exercise program or new activity. Ask your health care provider what exercises and activities are safe for you. This information is not intended to replace advice given to you by your health care provider. Make sure you discuss any questions you have with your health care provider. Document Revised: 12/18/2019 Document Reviewed: 12/18/2019 Elsevier Patient Education  2022 Elsevier Inc.  

## 2021-05-12 ENCOUNTER — Telehealth: Payer: Self-pay

## 2021-05-12 NOTE — Telephone Encounter (Signed)
[facility] to check status of referral sent on 05/04/21.  They have tried contacting patient to schedule appointment but patient has not returned their call for scheduling.   ?

## 2021-05-13 ENCOUNTER — Encounter: Payer: Self-pay | Admitting: Nurse Practitioner

## 2021-05-13 ENCOUNTER — Other Ambulatory Visit: Payer: Self-pay

## 2021-05-13 ENCOUNTER — Ambulatory Visit (INDEPENDENT_AMBULATORY_CARE_PROVIDER_SITE_OTHER): Payer: Medicare HMO | Admitting: Nurse Practitioner

## 2021-05-13 VITALS — BP 123/74 | HR 70 | Temp 98.5°F | Ht 66.5 in | Wt 143.6 lb

## 2021-05-13 DIAGNOSIS — F17210 Nicotine dependence, cigarettes, uncomplicated: Secondary | ICD-10-CM

## 2021-05-13 DIAGNOSIS — D692 Other nonthrombocytopenic purpura: Secondary | ICD-10-CM | POA: Diagnosis not present

## 2021-05-13 DIAGNOSIS — F419 Anxiety disorder, unspecified: Secondary | ICD-10-CM | POA: Diagnosis not present

## 2021-05-13 DIAGNOSIS — E559 Vitamin D deficiency, unspecified: Secondary | ICD-10-CM

## 2021-05-13 DIAGNOSIS — K219 Gastro-esophageal reflux disease without esophagitis: Secondary | ICD-10-CM

## 2021-05-13 DIAGNOSIS — J432 Centrilobular emphysema: Secondary | ICD-10-CM | POA: Diagnosis not present

## 2021-05-13 DIAGNOSIS — C9110 Chronic lymphocytic leukemia of B-cell type not having achieved remission: Secondary | ICD-10-CM

## 2021-05-13 DIAGNOSIS — I700 Atherosclerosis of aorta: Secondary | ICD-10-CM | POA: Diagnosis not present

## 2021-05-13 DIAGNOSIS — F339 Major depressive disorder, recurrent, unspecified: Secondary | ICD-10-CM | POA: Diagnosis not present

## 2021-05-13 DIAGNOSIS — I10 Essential (primary) hypertension: Secondary | ICD-10-CM

## 2021-05-13 DIAGNOSIS — D8989 Other specified disorders involving the immune mechanism, not elsewhere classified: Secondary | ICD-10-CM

## 2021-05-13 DIAGNOSIS — Z79899 Other long term (current) drug therapy: Secondary | ICD-10-CM

## 2021-05-13 DIAGNOSIS — D696 Thrombocytopenia, unspecified: Secondary | ICD-10-CM | POA: Diagnosis not present

## 2021-05-13 DIAGNOSIS — R69 Illness, unspecified: Secondary | ICD-10-CM | POA: Diagnosis not present

## 2021-05-13 DIAGNOSIS — E7800 Pure hypercholesterolemia, unspecified: Secondary | ICD-10-CM

## 2021-05-13 LAB — MICROALBUMIN, URINE WAIVED
Creatinine, Urine Waived: 50 mg/dL (ref 10–300)
Microalb, Ur Waived: 10 mg/L (ref 0–19)
Microalb/Creat Ratio: <30 mg/g (ref <30)

## 2021-05-13 MED ORDER — LORAZEPAM 1 MG PO TABS: 0.5000 mg | ORAL_TABLET | Freq: Two times a day (BID) | ORAL | 3 refills | Status: DC | PRN

## 2021-05-13 NOTE — Assessment & Plan Note (Signed)
Noted on exam, recommend gentle skin care and monitoring for wounds, if wounds present immediately alert provider. 

## 2021-05-13 NOTE — Assessment & Plan Note (Signed)
Chronic, under treatment with oncology.  Recent note and labs reviewed.  Continue collaboration with oncology provider.   ?

## 2021-05-13 NOTE — Assessment & Plan Note (Signed)
Chronic, ongoing.  Continue current medication regimen, Albuterol only.  She is scheduled to return to pulmonary and suspect will start LABA/LAMA which discussed with patient would be beneficial due to progressive nature of disease.  Recommend she avoid use of Benadryl due to age >92 and BEERS criteria, utilize Claritin instead.  Return to office in 3 months. ? ?

## 2021-05-13 NOTE — Assessment & Plan Note (Signed)
Recently quit, recommend continued cessation. 

## 2021-05-13 NOTE — Assessment & Plan Note (Signed)
Followed by oncology, continue this collaboration.  Recent notes reviewed. ?

## 2021-05-13 NOTE — Assessment & Plan Note (Signed)
Chronic, stable with no SI/HI.  Continue Paxil at this time and consider transition in future to Sertraline due to age >57 and anticholinergic effects with Paxil.  Continue Ativan as needed, patient aware of risks with long term use.  Refills sent in.  UDS obtained today (05/13/21) and obtain controlled substance contract next visit.  Return in 3 months. ?

## 2021-05-13 NOTE — Assessment & Plan Note (Signed)
With underlying CLL, followed by oncology.  Continue this collaboration. 

## 2021-05-13 NOTE — Assessment & Plan Note (Signed)
Long term, patient aware of risks with long term use.  UDS obtained today (05/13/21) and obtain controlled substance contract next visit.   ?

## 2021-05-13 NOTE — Progress Notes (Signed)
? ?BP 123/74   Pulse 70   Temp 98.5 ?F (36.9 ?C) (Oral)   Ht 5' 6.5" (1.689 m)   Wt 143 lb 9.6 oz (65.1 kg)   SpO2 93%   BMI 22.83 kg/m?   ? ?Subjective:  ? ? Patient ID: Katherine [name], female    DOB: Aug 04, 1940, 81 y.o.   MRN: [id number] ? ?HPI: ?Katherine [name] is a 81 y.o. female ? ?Chief Complaint  ?Patient presents with  ? Mood  ? COPD  ? Hyperlipidemia  ? ?HYPERLIPIDEMIA ?Continues on Atorvastatin 10 MG.  Has underlying CLL and is followed closely by oncology with last visit 05/01/21 -- was started on infusion, had initial one on 05/01/21.   ? ?Quit in January 2023 after pneumonia.  Had smoked for several years.  Has underlying emphysema and aortic atherosclerosis noted on past imaging and minimally uses her Albuterol ?Hyperlipidemia status: good compliance ?Satisfied with current treatment?  yes ?Side effects:  no ?Medication compliance: good compliance ?Supplements: none ?Aspirin:  no ?The ASCVD Risk score (Arnett DK, et al., 2019) failed to calculate for the following reasons: ?  The 2019 ASCVD risk score is only valid for ages 14 to 60 ?Chest pain:  no ?Coronary artery disease:  no ?Family history CAD:  yes ?Family history early CAD:  no  ? ?COPD ?Followed by pulmonary in past, last 01/22/20.  Returns on April.  Has underlying emphysema and aortic atherosclerosis noted on past imaging. ?COPD status: stable ?Satisfied with current treatment?: yes ?Oxygen use: no ?Dyspnea frequency: none ?Cough frequency: none ?Rescue inhaler frequency: 2 times a day, although reports she only does this because she thought she was supposed to ?Limitation of activity: no ?Productive cough: none ?Last Spirometry: unknown ?Pneumovax: Up to Date ?Influenza: Up to Date ? ?ANXIETY/STRESS ?Continues on Paxil 30 MG daily + Ativan 0.5 MG BID PRN -- often takes twice a day.  Pt is aware of risks of benzo medication use to include increased sedation, respiratory suppression, falls, dependence and cardiovascular events.  Pt  would like to continue treatment as benefit determined to outweigh risk.  PDMP review last filled 03/08/21. ? 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   ?Duration:controlled ?Anxious mood: yes, in afternoon on occasion ?Excessive worrying: yes, occasional ?Irritability: no  ?Sweating: no ?Nausea: no ?Palpitations:no ?Hyperventilation: no ?Panic attacks: no ?Agoraphobia: no  ?Obscessions/compulsions: no ?Depressed mood: no ? ?  05/13/2021  ?  9:47 AM 03/20/2021  ?  2:37 PM 11/12/2020  ? 11:15 AM 08/06/2020  ? 10:46 AM 02/05/2020  ? 10:01 AM  ?Depression screen PHQ 2/9  ?Decreased Interest 0 0 0 0 3  ?Down, Depressed, Hopeless 0 0 0 0 0  ?PHQ - 2 Score 0 0 0 0 3  ?Altered sleeping 0 0 0 0 0  ?Tired, decreased energy 0 0 1 0 3  ?Change in appetite 0 0 0 0 0  ?Feeling bad or failure about yourself  0 0 0 0 0  ?Trouble concentrating 0 0 0 0 0  ?Moving slowly or fidgety/restless 0 0 0 0 0  ?Suicidal thoughts 0 0 0 0 0  ?PHQ-9 Score 0 0 1 0 6  ?Difficult doing work/chores   Not difficult at all    ?Anhedonia: no ?Weight changes: no ?Insomnia: none ?Hypersomnia: no ?Fatigue/loss of energy: no ?Feelings of worthlessness: no ?Feelings of guilt: no ?Impaired concentration/indecisiveness: no ?Suicidal ideations: no  ?  Crying spells: no ?Recent Stressors/Life Changes: no ?  Relationship problems: no ?  Family stress: no   ?  Financial stress: no  ?  Job stress: no  ?  Recent death/loss: no ? ?  06-02-21  ?  9:48 AM 03/20/2021  ?  2:38 PM 11/12/2020  ? 11:24 AM 08/06/2020  ? 11:03 AM  ?GAD 7 : Generalized Anxiety Score  ?Nervous, Anxious, on Edge 0 0 0 0  ?Control/stop worrying 0 0 0 0  ?Worry too much - different things 0 0 0 0  ?Trouble relaxing 0 0 0 0  ?Restless 0 0 0 0  ?Easily annoyed or irritable 0 0 0 0  ?Afraid - awful might happen 0 0 0 0  ?Total GAD 7 Score 0 0 0 0  ?Anxiety Difficulty Not difficult at all Not difficult at all Not difficult at all Not difficult  at all  ? ?Relevant past medical, surgical, family and social history reviewed and updated as indicated. Interim medical history since our last visit reviewed. ?Allergies and medications reviewed and updated. ? ?Review of Systems  ?Constitutional:  Negative for activity change, appetite change, diaphoresis, fatigue and fever.  ?HENT:  Negative for ear discharge and ear pain.   ?Respiratory:  Negative for cough, chest tightness and shortness of breath.   ?Cardiovascular:  Negative for chest pain, palpitations and leg swelling.  ?Gastrointestinal: Negative.   ?Neurological: Negative.   ?Psychiatric/Behavioral: Negative.    ? ?Per HPI unless specifically indicated above ? ?   ?Objective:  ?  ?BP 123/74   Pulse 70   Temp 98.5 ?F (36.9 ?C) (Oral)   Ht 5' 6.5" (1.689 m)   Wt 143 lb 9.6 oz (65.1 kg)   SpO2 93%   BMI 22.83 kg/m?   ?Wt Readings from Last 3 Encounters:  ?June 02, 2021 143 lb 9.6 oz (65.1 kg)  ?05/01/21 142 lb 12.8 oz (64.8 kg)  ?04/10/21 141 lb 6.4 oz (64.1 kg)  ?  ?Physical Exam ?Vitals and nursing note reviewed.  ?Constitutional:   ?   General: She is awake. She is not in acute distress. ?   Appearance: She is well-developed and well-groomed. She is not ill-appearing or toxic-appearing.  ?HENT:  ?   Head: Normocephalic.  ?   Right Ear: Hearing normal.  ?   Left Ear: Hearing normal.  ?Eyes:  ?   General: Lids are normal.     ?   Right eye: No discharge.     ?   Left eye: No discharge.  ?   Conjunctiva/sclera: Conjunctivae normal.  ?   Pupils: Pupils are equal, round, and reactive to light.  ?Neck:  ?   Thyroid: No thyromegaly.  ?   Vascular: No carotid bruit.  ?Cardiovascular:  ?   Rate and Rhythm: Normal rate and regular rhythm.  ?   Heart sounds: Normal heart sounds. No murmur heard. ?  No gallop.  ?Pulmonary:  ?   Effort: Pulmonary effort is normal. No accessory muscle usage or respiratory distress.  ?   Breath sounds: Normal breath sounds.  ?Abdominal:  ?   General: Bowel sounds are normal.  ?    Palpations: Abdomen is soft.  ?Musculoskeletal:  ?   Cervical back: Normal range of motion and neck supple.  ?   Right lower leg: 1+ Edema present.  ?   Left lower leg: 1+ Edema present.  ?Lymphadenopathy:  ?   Cervical: No cervical adenopathy.  ?Skin: ?   General:  Skin is warm and dry.  ?   Comments: Scattered pale bruising to upper extremities.  ?Neurological:  ?   Mental Status: She is alert and oriented to person, place, and time.  ?   Deep Tendon Reflexes: Reflexes are normal and symmetric.  ?   Reflex Scores: ?     Brachioradialis reflexes are 2+ on the right side and 2+ on the left side. ?     Patellar reflexes are 2+ on the right side and 2+ on the left side. ?Psychiatric:     ?   Attention and Perception: Attention normal.     ?   Mood and Affect: Mood normal.     ?   Speech: Speech normal.     ?   Behavior: Behavior normal. Behavior is cooperative.     ?   Thought Content: Thought content normal.  ? ? ?Results for orders placed or performed in visit on 05/01/21  ?Lactate dehydrogenase  ?Result Value Ref Range  ? LDH 128 98 - 192 U/L  ?Comprehensive metabolic panel  ?Result Value Ref Range  ? Sodium 137 135 - 145 mmol/L  ? Potassium 3.9 3.5 - 5.1 mmol/L  ? Chloride 102 98 - 111 mmol/L  ? CO2 30 22 - 32 mmol/L  ? Glucose, Bld 94 70 - 99 mg/dL  ? BUN 12 8 - 23 mg/dL  ? Creatinine, Ser 0.69 0.44 - 1.00 mg/dL  ? Calcium 9.1 8.9 - 10.3 mg/dL  ? Total Protein 6.5 6.5 - 8.1 g/dL  ? Albumin 4.3 3.5 - 5.0 g/dL  ? AST 19 15 - 41 U/L  ? ALT 12 0 - 44 U/L  ? Alkaline Phosphatase 100 38 - 126 U/L  ? Total Bilirubin 1.1 0.3 - 1.2 mg/dL  ? GFR, Estimated >60 >60 mL/min  ? Anion gap 5 5 - 15  ?CBC with Differential/Platelet  ?Result Value Ref Range  ? WBC 101.9 (HH) 4.0 - 10.5 K/uL  ? RBC 4.07 3.87 - 5.11 MIL/uL  ? Hemoglobin 13.3 12.0 - 15.0 g/dL  ? HCT 41.4 36.0 - 46.0 %  ? MCV 101.7 (H) 80.0 - 100.0 fL  ? MCH 32.7 26.0 - 34.0 pg  ? MCHC 32.1 30.0 - 36.0 g/dL  ? RDW 13.7 11.5 - 15.5 %  ? Platelets 125 (L) 150 - 400 K/uL   ? nRBC 0.0 0.0 - 0.2 %  ? Neutrophils Relative % 2 %  ? Lymphocytes Relative 95 %  ? Monocytes Relative 2 %  ? Eosinophils Relative 1 %  ? Basophils Relative 0 %  ? Band Neutrophils 0 %  ? Metamyelocytes Relative 0 %

## 2021-05-13 NOTE — Assessment & Plan Note (Signed)
Chronic, ongoing.  Continue current medication regimen and adjust as needed. Lipid panel today. 

## 2021-05-13 NOTE — Assessment & Plan Note (Addendum)
Chronic, ongoing with BP at goal today for age.  Continue current medication regimen and adjust as needed.  Recommend she monitor BP at least a few mornings a week at home and document.  DASH diet at home.  CMP up to date with oncology, reviewed.  Check TSH today and urine ALB. ? ? ?

## 2021-05-13 NOTE — Assessment & Plan Note (Signed)
Chronic, stable with no SI/HI.  Continue Paxil at this time and consider transition in future to Sertraline due to age >61 and anticholinergic effects with Paxil.  Continue Ativan as needed, patient aware of risks with long term use.  Refills sent in.  UDS obtained today (05/13/21) and obtain controlled substance contract next visit.  Return in 3 months. ?

## 2021-05-13 NOTE — Assessment & Plan Note (Addendum)
Noted on imaging in past, recommend continued cessation of smoking and continue daily statin for prevention. ?

## 2021-05-14 LAB — DRUG SCREEN 764883 11+OXYCO+ALC+CRT-BUND
Amphetamines, Urine: NEGATIVE ng/mL
BENZODIAZ UR QL: NEGATIVE ng/mL
Barbiturate: NEGATIVE ng/mL
Cannabinoid Quant, Ur: NEGATIVE ng/mL
Cocaine (Metabolite): NEGATIVE ng/mL
Creatinine: 32.1 mg/dL (ref 20.0–300.0)
Ethanol: NEGATIVE %
Meperidine: NEGATIVE ng/mL
Methadone Screen, Urine: NEGATIVE ng/mL
OPIATE SCREEN URINE: NEGATIVE ng/mL
Oxycodone/Oxymorphone, Urine: NEGATIVE ng/mL
Phencyclidine: NEGATIVE ng/mL
Propoxyphene: NEGATIVE ng/mL
Tramadol: NEGATIVE ng/mL
pH, Urine: 6.7 (ref 4.5–8.9)

## 2021-05-14 LAB — LIPID PANEL W/O CHOL/HDL RATIO
Cholesterol, Total: 145 mg/dL (ref 100–199)
HDL: 39 mg/dL — ABNORMAL LOW (ref >39)
LDL Chol Calc (NIH): 84 mg/dL (ref 0–99)
Triglycerides: 119 mg/dL (ref 0–149)
VLDL Cholesterol Cal: 22 mg/dL (ref 5–40)

## 2021-05-14 LAB — TSH: TSH: 2.94 u[IU]/mL (ref 0.450–4.500)

## 2021-05-14 LAB — VITAMIN D 25 HYDROXY (VIT D DEFICIENCY, FRACTURES): Vit D, 25-Hydroxy: 35.9 ng/mL (ref 30.0–100.0)

## 2021-05-14 NOTE — Progress Notes (Signed)
Contacted via Index ? ? ?Good morning Katherine [name], your labs have returned and overall cholesterol levels remain stable -- continue Atorvastatin.  Thyroid level normal and Vitamin D level stable.  No changes needed.  Have a wonderful day!! ?Keep being amazing!!  Thank you for allowing me to participate in your care.  I appreciate you. ?Kindest regards, ?Frances Joynt ?

## 2021-05-15 ENCOUNTER — Ambulatory Visit: Payer: Medicare HMO | Admitting: Internal Medicine

## 2021-05-15 ENCOUNTER — Other Ambulatory Visit: Payer: Medicare HMO

## 2021-05-18 ENCOUNTER — Encounter: Payer: Self-pay | Admitting: Internal Medicine

## 2021-05-18 NOTE — Telephone Encounter (Signed)
Signing encounter, see previous note from 2/14 ?

## 2021-05-27 ENCOUNTER — Ambulatory Visit: Payer: Medicare HMO | Admitting: Pulmonary Disease

## 2021-05-27 ENCOUNTER — Encounter: Payer: Self-pay | Admitting: Pulmonary Disease

## 2021-05-27 VITALS — BP 120/80 | HR 75 | Temp 98.2°F | Ht 66.0 in | Wt 144.2 lb

## 2021-05-27 DIAGNOSIS — J449 Chronic obstructive pulmonary disease, unspecified: Secondary | ICD-10-CM

## 2021-05-27 DIAGNOSIS — Z87891 Personal history of nicotine dependence: Secondary | ICD-10-CM

## 2021-05-27 DIAGNOSIS — C9110 Chronic lymphocytic leukemia of B-cell type not having achieved remission: Secondary | ICD-10-CM | POA: Diagnosis not present

## 2021-05-27 DIAGNOSIS — J479 Bronchiectasis, uncomplicated: Secondary | ICD-10-CM

## 2021-05-27 DIAGNOSIS — J9811 Atelectasis: Secondary | ICD-10-CM

## 2021-05-27 MED ORDER — BREZTRI AEROSPHERE 160-9-4.8 MCG/ACT IN AERO: 2.0000 | INHALATION_SPRAY | Freq: Two times a day (BID) | RESPIRATORY_TRACT | 0 refills | Status: DC

## 2021-05-27 NOTE — Progress Notes (Signed)
Patient seen in the office today and instructed on use of flutter valve and breztri.  Patient expressed understanding and demonstrated technique.

## 2021-05-27 NOTE — Patient Instructions (Signed)
We are giving you a device called a flutter valve that will help you loosen up secretions. ? ?We are giving you a sample of an inhaler called Breztri this is 2 puffs twice a day make sure you rinse your mouth well after you use it.  Let us know how you are doing with this inhaler so that we can call it into your pharmacy if it is working well for you. ? ?To be on a maintenance inhaler to reduce the inflammation in your airway. ? ?We will see you in follow-up in 6 to 8 weeks time with a chest CT at that time to follow-up on the process on the left lung. ?

## 2021-05-27 NOTE — Progress Notes (Signed)
Subjective:    Patient ID: Katherine [name], female    DOB: Jun 23, 1940, 81 y.o.   MRN: [id number] Patient Care Team: Venita Lick, NP as PCP - General (Nurse Practitioner) [name], MD as PCP - Family Medicine (Family Medicine) Oneta Rack, MD (Dermatology) Manya Silvas, MD (Inactive) (Gastroenterology) Bary Castilla, Forest Gleason, MD (General Surgery) Cammie Sickle, MD as Consulting Physician (Oncology) Cammie Sickle, MD as Consulting Physician (Internal Medicine)  Chief Complaint  Patient presents with   Follow-up    Breathing is better.    HPI This is an 81 year old recent former smoker (quit January 2023) with 35-pack-year history of smoking who presents for abnormal chest CT.  Was previously evaluated here in October 2021 and November 2021 for left lower lobe collapse due to mucous plugging.  She has not followed here since then.  She has moderate COPD by prior PFTs.  She had resolution of her left lower lobe atelectasis after aggressive pulmonary toilet and management of her COPD.  At that time she was started on [facility].  Patient however continued use of the Breo (she cannot tell why) and did not follow-up as instructed.  She has a history of CLL and is followed by Dr. Rogue Bussing.  She is currently not on any therapy for this due to intolerance of the regimens.  To [facility] on 16 January 19 January with pneumonia initially noted to be right-sided however upon obtaining CT chest there was noted to be bilateral with atelectasis of the left lower lobe noted.  Mucous plugging versus aspiration entertained.  The patient was treated with broad-spectrum antibiotics to cover for the possibility of aspiration pneumonia.  Testing for influenza, COVID-19 and RSV were negative.  Sputum only grew "oropharyngeal flora".  She was treated with a cephalosporin and Flagyl.  She was also started on incentive spirometry.  Was also given saline solution for nebulization to  loosen secretions.  Since her discharge, she has abstained completely from tobacco use.  She still notes occasional cough productive of thick tenacious sputum that at times comes out like a "string".  She is not using any maintenance inhalers.  She uses albuterol as needed.  She did require oxygen during the first 2 days of her admission but then was able to be weaned off without sequela.  As noted she is currently on no maintenance for her COPD.  She has issues with chronic cough of over 6 months duration (close to years) at times productive of copious secretions other times productive of mucous plugs.  At other times she is unable to produce anything due to the tenacity of secretions.  She did not notice that hypertonic saline was helpful in clearing her secretions, this was provided at [facility] upon discharge.  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  She is getting IVIG infusions.  However do note that IgG deficiency does predispose to bronchiectasis.  On review of her CTs she does have bronchiectatic airways particularly on the left lower lobe.  Review of Systems A 10 point review of systems was performed and it is as noted above otherwise negative.  Past Medical History:  Diagnosis Date   Allergy    Anxiety    Depression    GERD (gastroesophageal reflux disease)    Hemorrhoids    Hypertension    Leukemia, lymphoid ([place])    CLL   Lobar pneumonia ([place])    Osteopenia    Personal history of chemotherapy  Past Surgical History:  Procedure Laterality Date   ABDOMINAL HYSTERECTOMY     APPENDECTOMY     BREAST BIOPSY Left    bx x 3-neg   COLON SURGERY     sigmoid resection   COLONOSCOPY     2007, 2012   COLONOSCOPY WITH PROPOFOL N/A 09/17/2015   Procedure: COLONOSCOPY WITH PROPOFOL;  Surgeon: Robert Bellow, MD;  Location: [facility] ENDOSCOPY;  Service: Endoscopy;  Laterality: N/A;   OOPHORECTOMY     SPINE SURGERY     L4-5   Patient Active Problem List   Diagnosis Date Noted    Type O blood, Rh negative 05/09/2021   Secondary immune deficiency disorder ([place]) 04/19/2021   Long-term current use of benzodiazepine 02/05/2020   Splenomegaly 02/02/2020   Thrombocytopenia ([place]) 02/02/2020   Allergic rhinitis 07/18/2018   Goals of care, counseling/discussion 07/05/2018   Nicotine dependence, cigarettes, uncomplicated 98/92/1194   Centrilobular emphysema ([place]) 02/05/2018   Advanced care planning/counseling discussion 01/09/2018   Anxiety 01/04/2017   Hypercholesterolemia 01/08/2016   Atherosclerosis of aorta ([place]) 01/07/2016   History of colonic polyps 08/14/2015   CLL (chronic lymphocytic leukemia) ([place]) 05/28/2015   Senile purpura ([place]) 01/06/2015   Essential hypertension 01/06/2015   Depression, recurrent ([place]) 01/06/2015   Family History  Problem Relation Age of Onset   Osteoporosis Mother    Hypertension Father    Heart attack Father    Stroke Maternal Grandfather    Breast cancer Sister 29   Social History   Tobacco Use   Smoking status: Former    Packs/day: 1.00    Years: 35.00    Pack years: 35.00    Types: Cigarettes    Quit date: 02/22/2021    Years since quitting: 0.2   Smokeless tobacco: Never  Substance Use Topics   Alcohol use: No    Alcohol/week: 0.0 standard drinks   Allergies  Allergen Reactions   Augmentin [Amoxicillin-Pot Clavulanate] Swelling    tongue   Biaxin [Clarithromycin] Swelling and Rash    tongue   Current Meds  Medication Sig   acetaminophen (TYLENOL) 325 MG tablet Take by mouth.   albuterol (VENTOLIN HFA) 108 (90 Base) MCG/ACT inhaler TAKE 2 PUFFS BY MOUTH EVERY 6 HOURS AS NEEDED FOR WHEEZE OR SHORTNESS OF BREATH   atorvastatin (LIPITOR) 10 MG tablet Take 1 tablet (10 mg total) by mouth daily.   benazepril (LOTENSIN) 40 MG tablet Take 1 tablet (40 mg total) by mouth daily.   LORazepam (ATIVAN) 1 MG tablet Take 0.5 tablets (0.5 mg total) by mouth 2 (two) times daily as needed for anxiety.   Multiple  Vitamins-Minerals (MULTIVITAMIN WITH MINERALS) tablet Take 1 tablet by mouth daily.   PARoxetine (PAXIL) 30 MG tablet Take 1 tablet (30 mg total) by mouth daily.   Spacer/Aero-Holding Chambers (OPTICHAMBER DIAMOND) MISC 1 EACH BY OTHER ROUTE ONCE FOR 1 DOSE.   vitamin C (ASCORBIC ACID) 250 MG tablet Take 250 mg by mouth daily.   vitamin E 1000 UNIT capsule Take by mouth.   Immunization History  Administered Date(s) Administered   Fluad Quad(high Dose 65+) 11/08/2019, 11/12/2020   Influenza, High Dose Seasonal PF 01/04/2017, 11/07/2018   Influenza,inj,Quad PF,6+ Mos 01/06/2015, 11/21/2015, 10/07/2017   Influenza-Unspecified 01/06/2015, 11/21/2015, 01/04/2017, 10/07/2017, 11/07/2018, 12/03/2020   PFIZER(Purple Top)SARS-COV-2 Vaccination 05/11/2019, 06/01/2019, 02/05/2020   Pneumococcal Conjugate-13 03/06/2014   Pneumococcal-Unspecified 02/22/1993, 02/23/2003   Td 07/24/2003   Tdap 11/18/2010   Zoster Recombinat (Shingrix) 08/02/2017, 02/04/2018   Zoster, Live 11/03/2005  Objective:   Physical Exam BP 120/80 (BP Location: Left Arm, Patient Position: Sitting, Cuff Size: Normal)   Pulse 75   Temp 98.2 F (36.8 C) (Oral)   Ht '5\' 6"'$  (1.676 m)   Wt 144 lb 3.2 oz (65.4 kg)   SpO2 93%   BMI 23.27 kg/m  GENERAL: Well-developed well-nourished woman, no acute distress, presents today in transport chair due to pain from arthritis. HEAD: Normocephalic, atraumatic.  EYES: Pupils equal, round, reactive to light.  No scleral icterus.  MOUTH: Nose/mouth/throat not examined due to masking requirements for COVID 19. NECK: Supple. No thyromegaly. Trachea midline. No JVD.  No adenopathy. PULMONARY: Good air entry bilaterally.  No adventitious sounds. CARDIOVASCULAR: S1 and S2. Regular rate and rhythm.  No rubs, murmurs or gallops heard. ABDOMEN: Benign. MUSCULOSKELETAL: No joint deformity, no clubbing, no edema.  NEUROLOGIC: No focal deficit noted, no gait disturbance, speech is fluent. SKIN:  Intact,warm,dry. PSYCH: Mood and behavior normal.       Assessment & Plan:     ICD-10-CM   1. Stage 2 moderate COPD by GOLD classification (HCC)  J44.9 Flutter valve   Breztri 2 puffs twice a day As needed albuterol      2. Atelectasis of left lung  J98.11 CT Chest Wo Contrast    Flutter valve   By mucous plugging Bronchiectatic airways Acapella flutter valve     3. Bronchiectasis without complication (HCC)  P82.4    Known IgG deficiency with CLL Needs increase pulmonary hygiene Has failed hypertonic saline Acapella flutter valve    4. CLL (chronic lymphocytic leukemia) (HCC)  C91.10    With associated IgG deficiency Has not tolerated therapies Getting IVIG    5. Former cigarette smoker  Z87.891    Quit smoking January 2023 No relapse Commended on cessation     Orders Placed This Encounter  Procedures   CT Chest Wo Contrast    Standing Status:   Future    Number of Occurrences:   1    Standing Expiration Date:   05/28/2022    Scheduling Instructions:     Prefers Wednesdays or Thursday mornings.    Order Specific Question:   Preferred imaging location?    Answer:   [facility]   Flutter valve    Standing Status:   Future    Standing Expiration Date:   05/28/2022   Meds ordered this encounter  Medications    Budeson-Glycopyrrol-Formoterol (BREZTRI AEROSPHERE) 160-9-4.8 MCG/ACT AERO    Sig: Inhale 2 puffs into the lungs in the morning and at bedtime.    Dispense:  5.9 g    Refill:  0    Order Specific Question:   Lot Number?    Answer:   [id number]    Order Specific Question:   Expiration Date?    Answer:   11/22/2023    Order Specific Question:   Manufacturer?    Answer:   [employer] [[id number]]    Order Specific Question:   Quantity    Answer:   2   We will see the patient in follow-up in 6 to 8 weeks time.  We will repeat a CT chest at that time to reevaluate her issues with mucous plugging and atelectasis.  She has been provided with a flutter valve with  instructions.  She was able to reproduce the maneuver as well.  She is to contact us prior to return appointment should any new difficulties arise.  Renold Don, MD Advanced Bronchoscopy PCCM [facility]    *  This note was dictated using voice recognition software/Dragon.  Despite best efforts to proofread, errors can occur which can change the meaning. Any transcriptional errors that result from this process are unintentional and may not be fully corrected at the time of dictation.

## 2021-06-03 ENCOUNTER — Other Ambulatory Visit: Payer: Self-pay

## 2021-06-03 DIAGNOSIS — C9110 Chronic lymphocytic leukemia of B-cell type not having achieved remission: Secondary | ICD-10-CM

## 2021-06-04 ENCOUNTER — Inpatient Hospital Stay: Payer: Medicare HMO

## 2021-06-04 ENCOUNTER — Inpatient Hospital Stay: Payer: Medicare HMO | Attending: Internal Medicine

## 2021-06-04 ENCOUNTER — Inpatient Hospital Stay (HOSPITAL_BASED_OUTPATIENT_CLINIC_OR_DEPARTMENT_OTHER): Payer: Medicare HMO | Admitting: Internal Medicine

## 2021-06-04 VITALS — BP 109/78 | HR 74

## 2021-06-04 VITALS — BP 152/73 | HR 72 | Temp 96.7°F | Resp 16 | Wt 144.0 lb

## 2021-06-04 DIAGNOSIS — C9110 Chronic lymphocytic leukemia of B-cell type not having achieved remission: Secondary | ICD-10-CM

## 2021-06-04 LAB — CBC WITH DIFFERENTIAL/PLATELET
Abs Immature Granulocytes: 0.11 10*3/uL — ABNORMAL HIGH (ref 0.00–0.07)
Basophils Absolute: 0.1 10*3/uL (ref 0.0–0.1)
Basophils Relative: 0 %
Eosinophils Absolute: 0.3 10*3/uL (ref 0.0–0.5)
Eosinophils Relative: 0 %
HCT: 42.2 % (ref 36.0–46.0)
Hemoglobin: 13.4 g/dL (ref 12.0–15.0)
Immature Granulocytes: 0 %
Lymphocytes Relative: 96 %
Lymphs Abs: 113.6 10*3/uL — ABNORMAL HIGH (ref 0.7–4.0)
MCH: 32.3 pg (ref 26.0–34.0)
MCHC: 31.8 g/dL (ref 30.0–36.0)
MCV: 101.7 fL — ABNORMAL HIGH (ref 80.0–100.0)
Monocytes Absolute: 2.7 10*3/uL — ABNORMAL HIGH (ref 0.1–1.0)
Monocytes Relative: 2 %
Neutro Abs: 2.5 10*3/uL (ref 1.7–7.7)
Neutrophils Relative %: 2 %
Platelets: 102 10*3/uL — ABNORMAL LOW (ref 150–400)
RBC: 4.15 MIL/uL (ref 3.87–5.11)
RDW: 13.6 % (ref 11.5–15.5)
Smear Review: NORMAL
WBC: 119.1 10*3/uL (ref 4.0–10.5)
nRBC: 0 % (ref 0.0–0.2)

## 2021-06-04 LAB — COMPREHENSIVE METABOLIC PANEL
ALT: 14 U/L (ref 0–44)
AST: 26 U/L (ref 15–41)
Albumin: 4.5 g/dL (ref 3.5–5.0)
Alkaline Phosphatase: 94 U/L (ref 38–126)
Anion gap: 6 (ref 5–15)
BUN: 13 mg/dL (ref 8–23)
CO2: 29 mmol/L (ref 22–32)
Calcium: 9.4 mg/dL (ref 8.9–10.3)
Chloride: 106 mmol/L (ref 98–111)
Creatinine, Ser: 0.72 mg/dL (ref 0.44–1.00)
GFR, Estimated: >60 mL/min (ref >60)
Glucose, Bld: 73 mg/dL (ref 70–99)
Potassium: 4.5 mmol/L (ref 3.5–5.1)
Sodium: 141 mmol/L (ref 135–145)
Total Bilirubin: 1.3 mg/dL — ABNORMAL HIGH (ref 0.3–1.2)
Total Protein: 6.7 g/dL (ref 6.5–8.1)

## 2021-06-04 LAB — LACTATE DEHYDROGENASE: LDH: 162 U/L (ref 98–192)

## 2021-06-04 MED ORDER — DEXTROSE 5 % IV SOLN
Freq: Once | INTRAVENOUS | Status: AC
  Filled 2021-06-04: qty 250

## 2021-06-04 MED ORDER — IMMUNE GLOBULIN (HUMAN) 5 GM/50ML IV SOLN
25.0000 g | Freq: Once | INTRAVENOUS | Status: AC
  Administered 2021-06-04: 25 g via INTRAVENOUS
  Filled 2021-06-04: qty 200

## 2021-06-04 MED ORDER — ACETAMINOPHEN 325 MG PO TABS
650.0000 mg | ORAL_TABLET | Freq: Once | ORAL | Status: AC
  Administered 2021-06-04: 650 mg via ORAL
  Filled 2021-06-04: qty 2

## 2021-06-04 MED ORDER — DIPHENHYDRAMINE HCL 25 MG PO CAPS
25.0000 mg | ORAL_CAPSULE | Freq: Once | ORAL | Status: AC
  Administered 2021-06-04: 25 mg via ORAL
  Filled 2021-06-04: qty 1

## 2021-06-04 NOTE — Assessment & Plan Note (Addendum)
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 Deletion-poor tolerance to Ibrutinib; and Acalburtinib [stopped in March 2022 because of diarrhea].  Patient's white count today is 119; normal hemoglobin/platelets.   ? ?#From a clinical standpoint patient's CLL is stable-White count is 119; hemoglobin 14 platelets slightly low at 102.  Recommend continue to monitor/surveillance given patient's poor tolerance to therapies. ? 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  Proceed with #2 today.  Discussed that we will plan maintenance infusions as needed down the line. ? ?#Pneumonia -bilateral Jan 2023 [smoker; [facility]]-CT scan February 15- Complete collapse of the right middle lobe and left lower lobe either from nonvisualized endobronchial lesions or retained secretions.  Status post evaluation with Dr.Gonzalez; awaiting repeat imaging.  Clinically stable. ? ?# Skin lesions- ? SCC Left UE-awaiting evaluation Dr.Graham re: skin cancer/ Left UE ??  ?# DISPOSITION:  ?# IVG infusion today ?# Follow up in 4 weeks [wed/thursday] ;MD; labs- cbc/CMP;LDH;IVIG infusion -Dr.B  ? ?

## 2021-06-04 NOTE — Progress Notes (Signed)
Patient has episodes of constipation and diarrhea.  Wants to know if the treatment receiving can effect memory.  Feels like it takes longer for her to say what she is thinking.   ? ?Pulmonologist gave her sample of Breztri to use 2 puffs bid but that caused increased cough so she decreased to 1 puff bid. ?

## 2021-06-04 NOTE — Progress Notes (Signed)
I connected with [name] on 06/04/21 at  9:00 AM EDT by video enabled telemedicine visit and verified that I am speaking with the correct person using two identifiers.  ?I discussed the limitations, risks, security and privacy concerns of performing an evaluation and management service by telemedicine and the availability of in-person appointments. I also discussed with the patient that there may be a patient responsible charge related to this service. The patient expressed understanding and agreed to proceed.  ? ? ?Other persons participating in the visit and their role in the encounter: RN/medical reconciliation ?Patient?s location: office ?Provider?s location: home ? ?Oncology History Overview Note  ?Katherine [name] is a 81 y.o. female with chronic lymphocytic leukemia (CLL).  WBC has ranged between 22,000 - 101,7000 since 05/2011. ?  ?She has received Rituxan x 2 four week cycles (06/27/2015 and 05/27/2017).  Hepatitis B surface antigen and hepatitis B surface antibody were negative on 07/04/2015. ?  ?FISH studies on 01/05/2019 revealed  93% of nuclei positive for homozygous 13q deletion and 81% of nuclei positive for three IGH signals.  CCND1, ATM, chromosome 12, and TP53 were normal.   ?  ?Flow cytometry on 04/09/2019 confirmed chonic lymphocytic leukemia, negative for CD38.  There was a CD5 and CD23 positive monoclonal B cell population with lambda light chain restriction, negative for FMC7 and CD38, representing  88% of leukocytes, >5,000/uL. There was no loss of, or aberrant expression of, the pan T cell  antigens to  suggest a neoplastic T cell process. CD4:CD8 ratio 2.0  No circulating blasts were detected. There was no immunophenotypic evidence of abnormal myeloid maturation.  IGH/BCL2 by FISH is pending. ?-------------------------------------------------------- ? She began ibrutinib on 04/24/2019 (discontinued on 05/30/2019). ?                    She developed blood-streaked sputum  (slight) then significant hematuria. ?                    She declined further ibrutinib. ?--------------------------------------------------------------------------------- ?           She began acalabrutinib on 03/14/2020 (held on 05/13/2020 secondary to diarrhea). ?-------------------------------------------------------------------------------------- ?  ?She has a 35 pack year smoking history.  She is in the low dose chest CT program.  Low dose chest CT on 02/02/2018 revealed a new endobronchial lesion in the segmental bronchus to the medial segment of the right middle lobe. This may simply reflect an area of retained secretions, however, the possibility of an endobronchial neoplasm should be considered. Lung-RADS 4AS, suspicious.  Low dose chest CT on 07/05/2018 revealed Lung-RADS 2, benign appearance or behavior.  Prior right middle lobe endobronchial lesion/mucous plugging was no longer visualized.  New mucous plugging in the right lower lobe. ? ?#Secondary immune deficiency [secondary to CLL; [FEB 2023IgG- 184]-]-multiple infections pneumonia [Feb 2023-[facility]]-IVIG infusions 400 mg/kg q ?  ?CLL (chronic lymphocytic leukemia) ([place])  ?05/28/2015 Initial Diagnosis  ? CLL (chronic lymphocytic leukemia) ([place]) ?  ? ? ? ?Chief Complaint: CLL/+  ? ? ?History of present illness:Katherine [name] 81 y.o.  female with history of CLL-13 q. Deletion currently on surveillance because of intolerance/side effects to multiple TKIs is here for follow-up/proceed with IVIG infusion given recent pneumonia. ? ?In the interim patient was evaluated by pulmonary; for the right middle lobe collapse.  Her breathing is stable.  Not any worse.  Denies any fevers or chills.  ? ?Patient complains of mild headaches from IVIG infusions.  Not  any worse. ?  ? ?Observation/objective: Alert & oriented x 3. In No acute distress.  ? ?Assessment and plan: ?CLL (chronic lymphocytic leukemia) ([place]) ?#CLL-Rai stage II; FISH 13 q. Deletion-poor tolerance to  Ibrutinib; and Acalburtinib [stopped in March 2022 because of diarrhea].  Patient's white count today is 119; normal hemoglobin/platelets.   ? ?#From a clinical standpoint patient's CLL is stable-White count is 119; hemoglobin 14 platelets slightly low at 102.  Recommend continue to monitor/surveillance given patient's poor tolerance to therapies. ? 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  Proceed with #2 today.  Discussed that we will plan maintenance infusions as needed down the line. ? ?#Pneumonia -bilateral Jan 2023 [smoker; [facility]]-CT scan February 15- Complete collapse of the right middle lobe and left lower lobe either from nonvisualized endobronchial lesions or retained secretions.  Status post evaluation with Dr.Gonzalez; awaiting repeat imaging.  Clinically stable. ? ?# Skin lesions- ? SCC Left UE-awaiting evaluation Dr.Graham re: skin cancer/ Left UE ?   ?# DISPOSITION:  ?# IVG infusion today ?# Follow up in 4 weeks [wed/thursday] ;MD; labs- cbc/CMP;LDH;IVIG infusion -Dr.B  ? ?Follow-up instructions: ? ?I discussed the assessment and treatment plan with the patient.  The patient was provided an opportunity to ask questions and all were answered.  The patient agreed with the plan and demonstrated understanding of instructions. ? ?The patient was advised to call back or seek an in person evaluation if the symptoms worsen or if the condition fails to improve as anticipated. ? ? ?Dr. Charlaine Dalton ?[facility] ?06/04/2021 ?1:37 PM ?

## 2021-06-05 ENCOUNTER — Ambulatory Visit: Payer: Medicare HMO

## 2021-06-05 ENCOUNTER — Ambulatory Visit: Payer: Medicare HMO | Admitting: Internal Medicine

## 2021-06-05 ENCOUNTER — Other Ambulatory Visit: Payer: Medicare HMO

## 2021-07-02 ENCOUNTER — Inpatient Hospital Stay: Payer: Medicare HMO | Attending: Internal Medicine | Admitting: Internal Medicine

## 2021-07-02 ENCOUNTER — Encounter: Payer: Self-pay | Admitting: Internal Medicine

## 2021-07-02 ENCOUNTER — Inpatient Hospital Stay: Payer: Medicare HMO

## 2021-07-02 ENCOUNTER — Inpatient Hospital Stay: Payer: Medicare HMO | Attending: Oncology

## 2021-07-02 VITALS — BP 122/59 | HR 70 | Temp 98.0°F | Ht 66.0 in | Wt 144.6 lb

## 2021-07-02 VITALS — BP 156/62 | HR 74 | Temp 97.0°F | Resp 19

## 2021-07-02 DIAGNOSIS — D849 Immunodeficiency, unspecified: Secondary | ICD-10-CM | POA: Diagnosis present

## 2021-07-02 DIAGNOSIS — C9110 Chronic lymphocytic leukemia of B-cell type not having achieved remission: Secondary | ICD-10-CM | POA: Diagnosis not present

## 2021-07-02 LAB — CBC WITH DIFFERENTIAL/PLATELET
Abs Immature Granulocytes: 0.13 10*3/uL — ABNORMAL HIGH (ref 0.00–0.07)
Basophils Absolute: 0 10*3/uL (ref 0.0–0.1)
Basophils Relative: 0 %
Eosinophils Absolute: 0.2 10*3/uL (ref 0.0–0.5)
Eosinophils Relative: 0 %
HCT: 39.6 % (ref 36.0–46.0)
Hemoglobin: 12.7 g/dL (ref 12.0–15.0)
Immature Granulocytes: 0 %
Lymphocytes Relative: 95 %
Lymphs Abs: 119.5 10*3/uL — ABNORMAL HIGH (ref 0.7–4.0)
MCH: 32.5 pg (ref 26.0–34.0)
MCHC: 32.1 g/dL (ref 30.0–36.0)
MCV: 101.3 fL — ABNORMAL HIGH (ref 80.0–100.0)
Monocytes Absolute: 3.5 10*3/uL — ABNORMAL HIGH (ref 0.1–1.0)
Monocytes Relative: 3 %
Neutro Abs: 2.6 10*3/uL (ref 1.7–7.7)
Neutrophils Relative %: 2 %
Platelets: 99 10*3/uL — ABNORMAL LOW (ref 150–400)
RBC: 3.91 MIL/uL (ref 3.87–5.11)
RDW: 13 % (ref 11.5–15.5)
Smear Review: NORMAL
WBC: 126 10*3/uL (ref 4.0–10.5)
nRBC: 0 % (ref 0.0–0.2)

## 2021-07-02 LAB — COMPREHENSIVE METABOLIC PANEL
ALT: 13 U/L (ref 0–44)
AST: 26 U/L (ref 15–41)
Albumin: 4.3 g/dL (ref 3.5–5.0)
Alkaline Phosphatase: 89 U/L (ref 38–126)
Anion gap: 4 — ABNORMAL LOW (ref 5–15)
BUN: 11 mg/dL (ref 8–23)
CO2: 29 mmol/L (ref 22–32)
Calcium: 9.1 mg/dL (ref 8.9–10.3)
Chloride: 105 mmol/L (ref 98–111)
Creatinine, Ser: 0.74 mg/dL (ref 0.44–1.00)
GFR, Estimated: >60 mL/min (ref >60)
Glucose, Bld: 101 mg/dL — ABNORMAL HIGH (ref 70–99)
Potassium: 4.1 mmol/L (ref 3.5–5.1)
Sodium: 138 mmol/L (ref 135–145)
Total Bilirubin: 0.9 mg/dL (ref 0.3–1.2)
Total Protein: 6.8 g/dL (ref 6.5–8.1)

## 2021-07-02 LAB — LACTATE DEHYDROGENASE: LDH: 153 U/L (ref 98–192)

## 2021-07-02 MED ORDER — ACETAMINOPHEN 325 MG PO TABS
650.0000 mg | ORAL_TABLET | Freq: Once | ORAL | Status: AC
  Administered 2021-07-02: 650 mg via ORAL
  Filled 2021-07-02: qty 2

## 2021-07-02 MED ORDER — DEXTROSE 5 % IV SOLN
Freq: Once | INTRAVENOUS | Status: AC
  Filled 2021-07-02: qty 250

## 2021-07-02 MED ORDER — DIPHENHYDRAMINE HCL 25 MG PO CAPS
25.0000 mg | ORAL_CAPSULE | Freq: Once | ORAL | Status: AC
  Administered 2021-07-02: 25 mg via ORAL
  Filled 2021-07-02: qty 1

## 2021-07-02 MED ORDER — IMMUNE GLOBULIN (HUMAN) 5 GM/50ML IV SOLN
25.0000 g | Freq: Once | INTRAVENOUS | Status: AC
  Administered 2021-07-02: 25 g via INTRAVENOUS
  Filled 2021-07-02: qty 200

## 2021-07-02 NOTE — Progress Notes (Signed)
C/o that the [facility] inhaler she was placed on states it can cause pneumonia and this is what she's trying to get over. ?

## 2021-07-02 NOTE — Progress Notes (Signed)
[place] ?CONSULT NOTE ? ?Patient Care Team: ?Venita Lick, NP as PCP - General (Nurse Practitioner) ?[name], MD as PCP - Family Medicine (Family Medicine) ?Oneta Rack, MD (Dermatology) ?Manya Silvas, MD (Inactive) (Gastroenterology) ?Robert Bellow, MD (General Surgery) ?Cammie Sickle, MD as Consulting Physician (Oncology) ?Cammie Sickle, MD as Consulting Physician (Internal Medicine) ? ?CHIEF COMPLAINTS/PURPOSE OF CONSULTATION:  CLL ? ?#  ?Oncology History Overview Note  ?Katherine [name] is a 81 y.o. female with chronic lymphocytic leukemia (CLL).  WBC has ranged between 22,000 - 101,7000 since 05/2011. ?  ?She has received Rituxan x 2 four week cycles (06/27/2015 and 05/27/2017).  Hepatitis B surface antigen and hepatitis B surface antibody were negative on 07/04/2015. ?  ?FISH studies on 01/05/2019 revealed  93% of nuclei positive for homozygous 13q deletion and 81% of nuclei positive for three IGH signals.  CCND1, ATM, chromosome 12, and TP53 were normal.   ?  ?Flow cytometry on 04/09/2019 confirmed chonic lymphocytic leukemia, negative for CD38.  There was a CD5 and CD23 positive monoclonal B cell population with lambda light chain restriction, negative for FMC7 and CD38, representing  88% of leukocytes, >5,000/uL. There was no loss of, or aberrant expression of, the pan T cell  antigens to  suggest a neoplastic T cell process. CD4:CD8 ratio 2.0  No circulating blasts were detected. There was no immunophenotypic evidence of abnormal myeloid maturation.  IGH/BCL2 by FISH is pending. ?-------------------------------------------------------- ? She began ibrutinib on 04/24/2019 (discontinued on 05/30/2019). ?                    She developed blood-streaked sputum (slight) then significant hematuria. ?                    She declined further ibrutinib. ?--------------------------------------------------------------------------------- ?           She  began acalabrutinib on 03/14/2020 (held on 05/13/2020 secondary to diarrhea). ?-------------------------------------------------------------------------------------- ?  ?She has a 35 pack year smoking history.  She is in the low dose chest CT program.  Low dose chest CT on 02/02/2018 revealed a new endobronchial lesion in the segmental bronchus to the medial segment of the right middle lobe. This may simply reflect an area of retained secretions, however, the possibility of an endobronchial neoplasm should be considered. Lung-RADS 4AS, suspicious.  Low dose chest CT on 07/05/2018 revealed Lung-RADS 2, benign appearance or behavior.  Prior right middle lobe endobronchial lesion/mucous plugging was no longer visualized.  New mucous plugging in the right lower lobe. ? ?#Secondary immune deficiency [secondary to CLL; [FEB 2023IgG- 184]-]-multiple infections pneumonia [Feb 2023-[facility]]-IVIG infusions 400 mg/kg q ?  ?CLL (chronic lymphocytic leukemia) ([place])  ?05/28/2015 Initial Diagnosis  ? CLL (chronic lymphocytic leukemia) (HCC) ?  ? ?HISTORY OF PRESENTING ILLNESS: Frail-appearing Caucasian female patient.  Accompanied by her daughter. ? ?[name] 81 y.o.  female CLL-13 q. Deletion currently on surveillance because of intolerance/side effects to multiple  TKIs is here for follow-up/proceed with IVIG infusion given recent pneumonia. ? ?Shortness of breath is better.  Cough is better. ? ?Chronic mild swelling in the legs.  No blood in stools or black or stools.  Denies any new lumps or bumps. ? ?Review of Systems  ?Constitutional:  Positive for malaise/fatigue. Negative for chills, diaphoresis, fever and weight loss.  ?HENT:  Negative for nosebleeds and sore throat.   ?Eyes:  Negative for double vision.  ?Respiratory:  Negative  for cough, hemoptysis, sputum production, shortness of breath and wheezing.   ?Cardiovascular:  Negative for chest pain, palpitations, orthopnea and leg swelling.  ?Gastrointestinal:  Negative  for abdominal pain, blood in stool, constipation, diarrhea, heartburn, melena, nausea and vomiting.  ?Genitourinary:  Negative for dysuria, frequency and urgency.  ?Musculoskeletal:  Positive for back pain. Negative for joint pain.  ?Skin: Negative.  Negative for itching and rash.  ?Neurological:  Negative for dizziness, tingling, focal weakness, weakness and headaches.  ?Endo/Heme/Allergies:  Does not bruise/bleed easily.  ?Psychiatric/Behavioral:  Negative for depression. The patient is not nervous/anxious and does not have insomnia.    ? ?MEDICAL HISTORY:  ?Past Medical History:  ?Diagnosis Date  ? Allergy   ? Anxiety   ? Depression   ? GERD (gastroesophageal reflux disease)   ? Hemorrhoids   ? Hypertension   ? Leukemia, lymphoid ([place])   ? CLL  ? Lobar pneumonia ([place])   ? Osteopenia   ? Personal history of chemotherapy   ? ? ?SURGICAL HISTORY: ?Past Surgical History:  ?Procedure Laterality Date  ? ABDOMINAL HYSTERECTOMY    ? APPENDECTOMY    ? BREAST BIOPSY Left   ? bx x 3-neg  ? COLON SURGERY    ? sigmoid resection  ? COLONOSCOPY    ? 2007, 2012  ? COLONOSCOPY WITH PROPOFOL N/A 09/17/2015  ? Procedure: COLONOSCOPY WITH PROPOFOL;  Surgeon: Robert Bellow, MD;  Location: [facility] ENDOSCOPY;  Service: Endoscopy;  Laterality: N/A;  ? OOPHORECTOMY    ? SPINE SURGERY    ? L4-5  ? ? ?SOCIAL HISTORY: ?Social History  ? ?Socioeconomic History  ? Marital status: Married  ?  Spouse name: Not on file  ? Number of children: Not on file  ? Years of education: 12  ? Highest education level: 12th grade  ?Occupational History  ? Not on file  ?Tobacco Use  ? Smoking status: Former  ?  Packs/day: 1.00  ?  Years: 35.00  ?  Pack years: 35.00  ?  Types: Cigarettes  ?  Quit date: 02/22/2021  ?  Years since quitting: 0.3  ? Smokeless tobacco: Never  ?Vaping Use  ? Vaping Use: Never used  ?Substance and Sexual Activity  ? Alcohol use: No  ?  Alcohol/week: 0.0 standard drinks  ? Drug use: No  ? Sexual activity: Not on file  ?Other Topics  Concern  ? Not on file  ?Social History Narrative  ? Not on file  ? ?Social Determinants of Health  ? ?Financial Resource Strain: Not on file  ?Food Insecurity: Not on file  ?Transportation Needs: Not on file  ?Physical Activity: Not on file  ?Stress: Not on file  ?Social Connections: Not on file  ?Intimate Partner Violence: Not on file  ? ? ?FAMILY HISTORY: ?Family History  ?Problem Relation Age of Onset  ? Osteoporosis Mother   ? Hypertension Father   ? Heart attack Father   ? Stroke Maternal Grandfather   ? Breast cancer Sister 84  ? ? ?ALLERGIES:  is allergic to augmentin [amoxicillin-pot clavulanate] and biaxin [clarithromycin]. ? ?MEDICATIONS:  ?Current Outpatient Medications  ?Medication Sig Dispense Refill  ? acetaminophen (TYLENOL) 325 MG tablet Take by mouth.    ? albuterol (VENTOLIN HFA) 108 (90 Base) MCG/ACT inhaler TAKE 2 PUFFS BY MOUTH EVERY 6 HOURS AS NEEDED FOR WHEEZE OR SHORTNESS OF BREATH 108 g 9  ? atorvastatin (LIPITOR) 10 MG tablet Take 1 tablet (10 mg total) by mouth daily. 90 tablet 4  ?  benazepril (LOTENSIN) 40 MG tablet Take 1 tablet (40 mg total) by mouth daily. 90 tablet 4  ? Budeson-Glycopyrrol-Formoterol (BREZTRI AEROSPHERE) 160-9-4.8 MCG/ACT AERO Inhale 2 puffs into the lungs in the morning and at bedtime. 5.9 g 0  ? LORazepam (ATIVAN) 1 MG tablet Take 0.5 tablets (0.5 mg total) by mouth 2 (two) times daily as needed for anxiety. 60 tablet 3  ? Multiple Vitamins-Minerals (MULTIVITAMIN WITH MINERALS) tablet Take 1 tablet by mouth daily.    ? PARoxetine (PAXIL) 30 MG tablet Take 1 tablet (30 mg total) by mouth daily. 90 tablet 4  ? Spacer/Aero-Holding Chambers (OPTICHAMBER DIAMOND) MISC 1 EACH BY OTHER ROUTE ONCE FOR 1 DOSE.    ? vitamin C (ASCORBIC ACID) 250 MG tablet Take 250 mg by mouth daily.    ? vitamin E 1000 UNIT capsule Take by mouth.    ? ?No current facility-administered medications for this visit.  ? ? ?  ?. ? ?PHYSICAL EXAMINATION: ?ECOG PERFORMANCE STATUS: 1 - Symptomatic  but completely ambulatory ? ?Vitals:  ? 07/02/21 0822  ?BP: (!) 122/59  ?Pulse: 70  ?Temp: 98 ?F (36.7 ?C)  ?SpO2: 97%  ? ?Filed Weights  ? 07/02/21 5488  ?Weight: 144 lb 9.6 oz (65.6 kg)  ? ? ?Physical Ex

## 2021-07-02 NOTE — Assessment & Plan Note (Addendum)
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 Deletion-poor tolerance to Ibrutinib; and Acalburtinib [stopped in March 2022 because of diarrhea].   ? ?#From a clinical standpoint patient's CLL is stable; however patient's white count is rising-White count is 126; hemoglobin 12.5 platelets slightly low at 99.  Recommend continue to monitor/surveillance given patient's poor tolerance to therapies.  Discussed with patient that she may need to consider therapies sooner than later. ? ?#Acute diarrhea: 3-4 days-unclear etiology improved with Pepto-Bismol.  Monitor for now. ? 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  Proceed with #3 today.  We will check quantitative immunoglobulins.  Discussed that we will plan maintenance infusions as needed down the line. ? ?#Pneumonia -bilateral Jan 2023 [QUITsmoking-April, 2023; [facility]]-CT scan February 15- Complete collapse of the right middle lobe and left lower lobe either from nonvisualized endobronchial lesions or retained secretions.  Status post evaluation with Dr.Gonzalez; awaiting repeat imaging.   STABLE.  ? ?# Skin lesions- ? SCC Left UE-awaiting evaluation Dr.Graham re: skin cancer/ Left UE ??  ?Imm in 4 w ?# DISPOSITION:  ?# IVG infusion today ?# Follow up in 4 weeks [wed/thursday] ;MD; labs- cbc/CMP;LDH;IVIG infusion -Dr.B  ? ?

## 2021-07-08 ENCOUNTER — Ambulatory Visit
Admission: RE | Admit: 2021-07-08 | Discharge: 2021-07-08 | Disposition: A | Discharge status: Home / Self Care | Payer: Medicare HMO | Source: Ambulatory Visit | Attending: Pulmonary Disease | Admitting: Pulmonary Disease

## 2021-07-08 DIAGNOSIS — J9811 Atelectasis: Secondary | ICD-10-CM | POA: Insufficient documentation

## 2021-07-08 DIAGNOSIS — J189 Pneumonia, unspecified organism: Secondary | ICD-10-CM | POA: Diagnosis not present

## 2021-07-11 ENCOUNTER — Other Ambulatory Visit: Payer: Self-pay | Admitting: Nurse Practitioner

## 2021-07-13 NOTE — Telephone Encounter (Signed)
Requested medication (s) are due for refill today: Yes  Requested medication (s) are on the active medication list: Yes  Last refill:  07/08/20  Future visit scheduled:   Notes to clinic:  Prescription expired.    Requested Prescriptions  Pending Prescriptions Disp Refills   albuterol (VENTOLIN HFA) 108 (90 Base) MCG/ACT inhaler [Pharmacy Med Name: ALBUTEROL HFA (VENTOLIN) INH] 108 each 9    Sig: TAKE 2 PUFFS BY MOUTH EVERY 6 HOURS AS NEEDED FOR WHEEZE OR SHORTNESS OF BREATH     Pulmonology:  Beta Agonists 2 Failed - 07/11/2021  9:33 AM      Failed - Last BP in normal range    BP Readings from Last 1 Encounters:  07/02/21 (!) 156/62         Passed - Last Heart Rate in normal range    Pulse Readings from Last 1 Encounters:  07/02/21 74         Passed - Valid encounter within last 12 months    Recent Outpatient Visits           2 months ago Centrilobular emphysema ([place])   [place] Niles, [place] T, NP   3 months ago Community acquired pneumonia of right middle lobe of lung   [facility] Cannady, Jolene T, NP   8 months ago CLL (chronic lymphocytic leukemia) ([place])   [place] Cannady, Jolene T, NP   11 months ago CLL (chronic lymphocytic leukemia) ([place])   [place] Cannady, Jolene T, NP   1 year ago CLL (chronic lymphocytic leukemia) ([place])   [place], Barbaraann Faster, NP       Future Appointments             In 1 month Cannady, Barbaraann Faster, NP [employer]

## 2021-07-21 ENCOUNTER — Telehealth: Payer: Self-pay | Admitting: Pulmonary Disease

## 2021-07-21 MED ORDER — BREZTRI AEROSPHERE 160-9-4.8 MCG/ACT IN AERO: 2.0000 | INHALATION_SPRAY | Freq: Two times a day (BID) | RESPIRATORY_TRACT | 11 refills | Status: DC

## 2021-07-21 NOTE — Telephone Encounter (Signed)
Patient has left a message on my voice mail while I was off. She stated that she had run out of a medication and stated that would she need a refill before she comes back to be seen

## 2021-07-21 NOTE — Telephone Encounter (Signed)
Katherine [name] has been sent to preferred pharmacy per patient request,  She is aware and voiced her understanding.  Nothing further needed.

## 2021-07-30 ENCOUNTER — Ambulatory Visit: Payer: Medicare HMO | Admitting: Pulmonary Disease

## 2021-07-30 ENCOUNTER — Encounter: Payer: Self-pay | Admitting: Pulmonary Disease

## 2021-07-30 VITALS — BP 112/70 | HR 67 | Temp 98.2°F | Ht 66.0 in | Wt 146.6 lb

## 2021-07-30 DIAGNOSIS — J449 Chronic obstructive pulmonary disease, unspecified: Secondary | ICD-10-CM | POA: Diagnosis not present

## 2021-07-30 DIAGNOSIS — J479 Bronchiectasis, uncomplicated: Secondary | ICD-10-CM

## 2021-07-30 DIAGNOSIS — R053 Chronic cough: Secondary | ICD-10-CM

## 2021-07-30 DIAGNOSIS — C9110 Chronic lymphocytic leukemia of B-cell type not having achieved remission: Secondary | ICD-10-CM

## 2021-07-30 DIAGNOSIS — D803 Selective deficiency of immunoglobulin G [IgG] subclasses: Secondary | ICD-10-CM | POA: Diagnosis not present

## 2021-07-30 MED ORDER — ALBUTEROL SULFATE HFA 108 (90 BASE) MCG/ACT IN AERS: 2.0000 | INHALATION_SPRAY | Freq: Four times a day (QID) | RESPIRATORY_TRACT | 2 refills | Status: DC | PRN

## 2021-07-30 NOTE — Patient Instructions (Signed)
We sent a prescription of your albuterol inhaler to the pharmacy.  Ordering a vest called the InCourage vest will help you clear your secretions.  We will see you in follow-up in 2 to 3 months time call sooner should any new problems arise

## 2021-07-30 NOTE — Progress Notes (Signed)
Subjective:    Patient ID: Katherine [name], female    DOB: 06-21-1940, 81 y.o.   MRN: [id number] Patient Care Team: Venita Lick, NP as PCP - General (Nurse Practitioner) [name], MD as PCP - Family Medicine (Family Medicine) Oneta Rack, MD (Dermatology) Manya Silvas, MD (Inactive) (Gastroenterology) Bary Castilla, Forest Gleason, MD (General Surgery) Cammie Sickle, MD as Consulting Physician (Oncology) Cammie Sickle, MD as Consulting Physician (Internal Medicine)  Chief Complaint  Patient presents with   Follow-up    Breathing is fine.  Coughing up clear to light grey mucous.  Seems drained.   HPI This is an 81 year old recent former smoker (quit January 2023) with 35-pack-year history of smoking who presents for follow-up on abnormal chest CT. last evaluated here on 27 May 2021.  She was previously evaluated here in October 2021 and November 2021 for left lower lobe collapse due to mucous plugging.  She has not followed here since then.  She has moderate COPD by prior PFTs.  She had resolution of her left lower lobe atelectasis after aggressive pulmonary toilet and management of her COPD.  At that time she was started on [facility].  Patient however, discontinued the use of the [facility] on her own and did not follow-up as instructed.  She has a history of CLL and is followed by Dr. Rogue Bussing.  She is currently not on any therapy for this due to intolerance of the regimens.  She was admitted to [facility] on 16 January 19 January with pneumonia initially noted to be right-sided however upon obtaining CT chest there was noted to be bilateral with atelectasis of the left lower lobe noted.  Mucous plugging versus aspiration entertained. The patient was treated with broad-spectrum antibiotics to cover for the possibility of aspiration pneumonia. Testing for influenza, COVID-19 and RSV were negative.  Sputum only grew "oropharyngeal flora".  She was treated with a  cephalosporin and Flagyl.  She was also started on incentive spirometry.  Was also given saline solution for nebulization to loosen secretions.  Since her discharge, she has abstained completely from tobacco use.  She continues to have cough productive of thick tenacious sputum that at times comes out like a "string".  She has been using an Acapella flutter valve with limited success.  She is compliant with Judithann Sauger, she does not have a rescue inhaler because she "ran out".  Today she looks somewhat debilitated.  She states that she just does not feel well.  She is to have an infusion of IVIG tomorrow, she notes that she usually feels better after these infusions.  Her immune deficiency is related to her CLL.  She had a CT chest on 08 Jul 2021 and these were discussed with her.  Previously she had collapse of the right middle lobe as well as the left lower lobe.  She has extensive mucus plugging.  The airways particularly on the left lower lobe appear bronchiectatic.  On the scan from 17 May she shows persistent complete collapse of the right middle lobe, continued airway impaction of the right lower lobe.  The left lower lobe had reexpanded.    The patient does not endorse any fevers, chills or sweats.  As noted she continues to have cough this is a chronic issue for her of over 6 months.  She has not had any hemoptysis.  No chest pain.  She does suffer with myalgias and arthralgias.  Review of Systems A 10 point review  of systems was performed and it is as noted above otherwise negative.  Patient Active Problem List   Diagnosis Date Noted   Type O blood, Rh negative 05/09/2021   Secondary immune deficiency disorder ([place]) 04/19/2021   Long-term current use of benzodiazepine 02/05/2020   Splenomegaly 02/02/2020   Thrombocytopenia ([place]) 02/02/2020   Allergic rhinitis 07/18/2018   Goals of care, counseling/discussion 07/05/2018   Nicotine dependence, cigarettes, uncomplicated 41/93/7902    Centrilobular emphysema ([place]) 02/05/2018   Advanced care planning/counseling discussion 01/09/2018   Anxiety 01/04/2017   Hypercholesterolemia 01/08/2016   Atherosclerosis of aorta ([place]) 01/07/2016   History of colonic polyps 08/14/2015   CLL (chronic lymphocytic leukemia) ([place]) 05/28/2015   Senile purpura ([place]) 01/06/2015   Essential hypertension 01/06/2015   Depression, recurrent ([place]) 01/06/2015   Social History   Tobacco Use   Smoking status: Former    Packs/day: 1.00    Years: 35.00    Total pack years: 35.00    Types: Cigarettes    Quit date: 02/22/2021    Years since quitting: 0.4   Smokeless tobacco: Never  Substance Use Topics   Alcohol use: No    Alcohol/week: 0.0 standard drinks of alcohol   Allergies  Allergen Reactions   Augmentin [Amoxicillin-Pot Clavulanate] Swelling    tongue   Biaxin [Clarithromycin] Swelling and Rash    tongue   Current Meds  Medication Sig   acetaminophen (TYLENOL) 325 MG tablet Take by mouth.   albuterol (VENTOLIN HFA) 108 (90 Base) MCG/ACT inhaler Inhale 2 puffs into the lungs every 6 (six) hours as needed for wheezing or shortness of breath.   atorvastatin (LIPITOR) 10 MG tablet Take 1 tablet (10 mg total) by mouth daily.   benazepril (LOTENSIN) 40 MG tablet Take 1 tablet (40 mg total) by mouth daily.   Budeson-Glycopyrrol-Formoterol (BREZTRI AEROSPHERE) 160-9-4.8 MCG/ACT AERO Inhale 2 puffs into the lungs in the morning and at bedtime.   LORazepam (ATIVAN) 1 MG tablet Take 0.5 tablets (0.5 mg total) by mouth 2 (two) times daily as needed for anxiety.   Multiple Vitamins-Minerals (MULTIVITAMIN WITH MINERALS) tablet Take 1 tablet by mouth daily.   PARoxetine (PAXIL) 30 MG tablet Take 1 tablet (30 mg total) by mouth daily.   Spacer/Aero-Holding Chambers (OPTICHAMBER DIAMOND) MISC 1 EACH BY OTHER ROUTE ONCE FOR 1 DOSE.   vitamin C (ASCORBIC ACID) 250 MG tablet Take 250 mg by mouth daily.   vitamin E 1000 UNIT capsule Take by mouth.    [DISCONTINUED] albuterol (VENTOLIN HFA) 108 (90 Base) MCG/ACT inhaler TAKE 2 PUFFS BY MOUTH EVERY 6 HOURS AS NEEDED FOR WHEEZE OR SHORTNESS OF BREATH   Immunization History  Administered Date(s) Administered   Fluad Quad(high Dose 65+) 11/08/2019, 11/12/2020   Influenza, High Dose Seasonal PF 01/04/2017, 11/07/2018   Influenza,inj,Quad PF,6+ Mos 01/06/2015, 11/21/2015, 10/07/2017   Influenza-Unspecified 01/06/2015, 11/21/2015, 01/04/2017, 10/07/2017, 11/07/2018, 12/03/2020   PFIZER(Purple Top)SARS-COV-2 Vaccination 05/11/2019, 06/01/2019, 02/05/2020   Pneumococcal Conjugate-13 03/06/2014   Pneumococcal-Unspecified 02/22/1993, 02/23/2003   Td 07/24/2003   Tdap 11/18/2010   Zoster Recombinat (Shingrix) 08/02/2017, 02/04/2018   Zoster, Live 11/03/2005      Objective:   Physical Exam BP 112/70 (BP Location: Left Arm, Patient Position: Sitting, Cuff Size: Normal)   Pulse 67   Temp 98.2 F (36.8 C) (Oral)   Ht '5\' 6"'$  (1.676 m)   Wt 146 lb 9.6 oz (66.5 kg)   SpO2 97%   BMI 23.66 kg/m  GENERAL: Well-developed, thin woman, well-groomed, chronically ill-appearing, no  acute distress, presents in transport chair.  She appears quite debilitated today. HEAD: Normocephalic, atraumatic.  EYES: Pupils equal, round, reactive to light.  No scleral icterus.  MOUTH: Oral mucosa moist.  Dentition intact. NECK: Supple. No thyromegaly. Trachea midline. No JVD.  No adenopathy. PULMONARY: Good air entry bilaterally.  No adventitious sounds. CARDIOVASCULAR: S1 and S2. Regular rate and rhythm.  No rubs, murmurs or gallops heard. ABDOMEN: Benign. MUSCULOSKELETAL: No joint deformity, no clubbing, 1-2+ edema of the feet/ankles NEUROLOGIC: No focal deficit noted, no gait disturbance, speech is fluent. SKIN: Intact,warm,dry. PSYCH: Mood and behavior normal.   Representative image of CT performed 08 Jul 2021 showing right middle lobe collapse and bronchiectatic airways particularly on left:     Assessment &  Plan:     ICD-10-CM   1. Stage 2 moderate COPD by GOLD classification ([place])  J44.9    Continue Breztri 2 puffs twice a day We have provided her with albuterol prescription for as needed use    2. Bronchiectasis without complication ([place])  Q94.5 Ambulatory Referral for DME   She alternates between copious/tenacious secretions Most recently has had tenacious secretions with mucous plugging Bronchiectasis noted consistently on CTs     3. Chronic cough  R05.3    Over 6 months duration Alternates with copious/tenacious sputum No hemoptysis    4. IgG deficiency (HCC)  D80.3    Related to her CLL This is likely cause for bronchiectasis Continue IVIG infusions    5. CLL (chronic lymphocytic leukemia) (HCC)  C91.10    This issue adds complexity to her management IgG deficiency related to this     Orders Placed This Encounter  Procedures   Ambulatory Referral for DME    Referral Priority:   Routine    Referral Type:   Durable Medical Equipment Purchase    Number of Visits Requested:   1   Meds ordered this encounter  Medications   albuterol (VENTOLIN HFA) 108 (90 Base) MCG/ACT inhaler    Sig: Inhale 2 puffs into the lungs every 6 (six) hours as needed for wheezing or shortness of breath.    Dispense:  8 g    Refill:  2    The patient will benefit from an airway clearance device.  She is only getting minimal to no relief from Acapella currently.  She cannot use other percussive methods of airway clearance due to debility and arthralgias/myalgias.  Have recommended and InCourage vest.  We will see him in follow-up in 2 to 3 months time call sooner should any new problems arise.  Renold Don, MD Advanced Bronchoscopy PCCM [facility]    *This note was dictated using voice recognition software/Dragon.  Despite best efforts to proofread, errors can occur which can change the meaning. Any transcriptional errors that result from this process are unintentional  and may not be fully corrected at the time of dictation.

## 2021-07-31 ENCOUNTER — Inpatient Hospital Stay: Payer: Medicare HMO | Admitting: Internal Medicine

## 2021-07-31 ENCOUNTER — Encounter: Payer: Self-pay | Admitting: Internal Medicine

## 2021-07-31 ENCOUNTER — Encounter: Payer: Self-pay | Admitting: Pulmonary Disease

## 2021-07-31 ENCOUNTER — Inpatient Hospital Stay: Payer: Medicare HMO

## 2021-07-31 ENCOUNTER — Other Ambulatory Visit: Payer: Self-pay

## 2021-07-31 ENCOUNTER — Inpatient Hospital Stay: Payer: Medicare HMO | Attending: Internal Medicine

## 2021-07-31 VITALS — BP 151/66 | HR 69 | Temp 97.0°F | Resp 16

## 2021-07-31 DIAGNOSIS — C9110 Chronic lymphocytic leukemia of B-cell type not having achieved remission: Secondary | ICD-10-CM

## 2021-07-31 DIAGNOSIS — Z79899 Other long term (current) drug therapy: Secondary | ICD-10-CM | POA: Diagnosis not present

## 2021-07-31 LAB — COMPREHENSIVE METABOLIC PANEL
ALT: 13 U/L (ref 0–44)
AST: 25 U/L (ref 15–41)
Albumin: 4.3 g/dL (ref 3.5–5.0)
Alkaline Phosphatase: 98 U/L (ref 38–126)
Anion gap: 8 (ref 5–15)
BUN: 14 mg/dL (ref 8–23)
CO2: 29 mmol/L (ref 22–32)
Calcium: 9.2 mg/dL (ref 8.9–10.3)
Chloride: 104 mmol/L (ref 98–111)
Creatinine, Ser: 0.81 mg/dL (ref 0.44–1.00)
GFR, Estimated: >60 mL/min (ref >60)
Glucose, Bld: 83 mg/dL (ref 70–99)
Potassium: 4 mmol/L (ref 3.5–5.1)
Sodium: 141 mmol/L (ref 135–145)
Total Bilirubin: 1 mg/dL (ref 0.3–1.2)
Total Protein: 7.2 g/dL (ref 6.5–8.1)

## 2021-07-31 LAB — CBC WITH DIFFERENTIAL/PLATELET
Abs Immature Granulocytes: 0.05 10*3/uL (ref 0.00–0.07)
Basophils Absolute: 0.1 10*3/uL (ref 0.0–0.1)
Basophils Relative: 0 %
Eosinophils Absolute: 0.4 10*3/uL (ref 0.0–0.5)
Eosinophils Relative: 0 %
HCT: 40 % (ref 36.0–46.0)
Hemoglobin: 12.7 g/dL (ref 12.0–15.0)
Immature Granulocytes: 0 %
Lymphocytes Relative: 96 %
Lymphs Abs: 110 10*3/uL — ABNORMAL HIGH (ref 0.7–4.0)
MCH: 32.3 pg (ref 26.0–34.0)
MCHC: 31.8 g/dL (ref 30.0–36.0)
MCV: 101.8 fL — ABNORMAL HIGH (ref 80.0–100.0)
Monocytes Absolute: 3.1 10*3/uL — ABNORMAL HIGH (ref 0.1–1.0)
Monocytes Relative: 3 %
Neutro Abs: 1.1 10*3/uL — ABNORMAL LOW (ref 1.7–7.7)
Neutrophils Relative %: 1 %
Platelets: 95 10*3/uL — ABNORMAL LOW (ref 150–400)
RBC: 3.93 MIL/uL (ref 3.87–5.11)
RDW: 13.6 % (ref 11.5–15.5)
Smear Review: NORMAL
WBC: 114.7 10*3/uL (ref 4.0–10.5)
nRBC: 0 % (ref 0.0–0.2)

## 2021-07-31 LAB — LACTATE DEHYDROGENASE: LDH: 159 U/L (ref 98–192)

## 2021-07-31 MED ORDER — DEXTROSE 5 % IV SOLN
Freq: Once | INTRAVENOUS | Status: AC
  Filled 2021-07-31: qty 250

## 2021-07-31 MED ORDER — IMMUNE GLOBULIN (HUMAN) 5 GM/50ML IV SOLN
25.0000 g | Freq: Once | INTRAVENOUS | Status: AC
  Administered 2021-07-31: 25 g via INTRAVENOUS
  Filled 2021-07-31: qty 50

## 2021-07-31 MED ORDER — DIPHENHYDRAMINE HCL 25 MG PO CAPS
25.0000 mg | ORAL_CAPSULE | Freq: Once | ORAL | Status: AC
  Administered 2021-07-31: 25 mg via ORAL
  Filled 2021-07-31: qty 1

## 2021-07-31 MED ORDER — ACETAMINOPHEN 325 MG PO TABS
650.0000 mg | ORAL_TABLET | Freq: Once | ORAL | Status: AC
  Administered 2021-07-31: 650 mg via ORAL
  Filled 2021-07-31: qty 2

## 2021-07-31 NOTE — Progress Notes (Unsigned)
[facility] NOTE  Patient Care Team: Venita Lick, NP as PCP - General (Nurse Practitioner) [name], MD as PCP - Family Medicine (Family Medicine) Oneta Rack, MD (Dermatology) Manya Silvas, MD (Inactive) (Gastroenterology) Bary Castilla, Forest Gleason, MD (General Surgery) Cammie Sickle, MD as Consulting Physician (Oncology) Cammie Sickle, MD as Consulting Physician (Internal Medicine)  CHIEF COMPLAINTS/PURPOSE OF CONSULTATION:  CLL  #  Oncology History Overview Note  Katherine [name] is a 81 y.o. female with chronic lymphocytic leukemia (CLL).  WBC has ranged between 22,000 - 101,7000 since 05/2011.   She has received Rituxan x 2 four week cycles (06/27/2015 and 05/27/2017).  Hepatitis B surface antigen and hepatitis B surface antibody were negative on 07/04/2015.   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  CCND1, ATM, chromosome 12, and TP53 were normal.     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  There was a CD5 and CD23 positive monoclonal B cell population with lambda light chain restriction, negative for FMC7 and CD38, representing  88% of leukocytes, >5,000/uL. There was no loss of, or aberrant expression of, the pan T cell  antigens to  suggest a neoplastic T cell process. CD4:CD8 ratio 2.0  No circulating blasts were detected. There was no immunophenotypic evidence of abnormal myeloid maturation.  IGH/BCL2 by FISH is pending. --------------------------------------------------------  She began ibrutinib on 04/24/2019 (discontinued on 05/30/2019).                     She developed blood-streaked sputum (slight) then significant hematuria.                     She declined further ibrutinib. ---------------------------------------------------------------------------------             She began acalabrutinib on 03/14/2020 (held on 05/13/2020 secondary to diarrhea). --------------------------------------------------------------------------------------   She has a 35 pack year smoking history.  She is in the low dose chest CT program.  Low dose chest CT on 02/02/2018 revealed a new endobronchial lesion in the segmental bronchus to the medial segment of the right middle lobe. This may simply reflect an area of retained secretions, however, the possibility of an endobronchial neoplasm should be considered. Lung-RADS 4AS, suspicious.  Low dose chest CT on 07/05/2018 revealed Lung-RADS 2, benign appearance or behavior.  Prior right middle lobe endobronchial lesion/mucous plugging was no longer visualized.  New mucous plugging in the right lower lobe.  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  Accompanied by her daughter.  Katherine [name] 81 y.o.  female CLL-13 q. Deletion currently on surveillance because of intolerance/side effects to multiple TKIs is here for follow-up/proceed with IVIG infusion given recent pneumonia.  As per the family patient is more tired.  Continues to have shortness of breath.  Cough is better.  S/p recent evaluation with pulmonary.  No fevers.  Chronic mild swelling in the legs.  No blood in stools or black or stools.  Denies any new lumps or bumps.  Review of Systems  Constitutional:  Positive for malaise/fatigue. Negative for chills, diaphoresis, fever and weight loss.  HENT:  Negative for nosebleeds and sore throat.   Eyes:  Negative for double vision.  Respiratory:  Positive for hemoptysis and shortness of breath. Negative for cough, sputum production and  wheezing.   Cardiovascular:  Negative for chest pain, palpitations, orthopnea and leg swelling.  Gastrointestinal:  Negative for abdominal pain, blood in stool, constipation, diarrhea, heartburn, melena, nausea and vomiting.  Genitourinary:  Negative for dysuria, frequency and urgency.  Musculoskeletal:  Positive for back pain. Negative for joint pain.  Skin: Negative.  Negative for itching and rash.  Neurological:  Negative for dizziness, tingling, focal weakness, weakness and headaches.  Endo/Heme/Allergies:  Does not bruise/bleed easily.  Psychiatric/Behavioral:  Negative for depression. The patient is not nervous/anxious and does not have insomnia.      MEDICAL HISTORY:  Past Medical History:  Diagnosis Date   Allergy    Anxiety    Depression    GERD (gastroesophageal reflux disease)    Hemorrhoids    Hypertension    Leukemia, lymphoid ([place])    CLL   Lobar pneumonia (HCC)    Osteopenia    Personal history of chemotherapy     SURGICAL HISTORY: Past Surgical History:  Procedure Laterality Date   ABDOMINAL HYSTERECTOMY     APPENDECTOMY     BREAST BIOPSY Left    bx x 3-neg   COLON SURGERY     sigmoid resection   COLONOSCOPY     2007, 2012   COLONOSCOPY WITH PROPOFOL N/A 09/17/2015   Procedure: COLONOSCOPY WITH PROPOFOL;  Surgeon: Robert Bellow, MD;  Location: [facility] ENDOSCOPY;  Service: Endoscopy;  Laterality: N/A;   OOPHORECTOMY     SPINE SURGERY     L4-5    SOCIAL HISTORY: Social History   Socioeconomic History   Marital status: Married    Spouse name: Not on file   Number of children: Not on file   Years of education: 12   Highest education level: 12th grade  Occupational History   Not on file  Tobacco Use   Smoking status: Former    Packs/day: 1.00    Years: 35.00    Total pack years: 35.00    Types: Cigarettes    Quit date: 02/22/2021    Years since quitting: 0.4   Smokeless tobacco: Never  Vaping Use   Vaping Use: Never used  Substance and  Sexual Activity   Alcohol use: No    Alcohol/week: 0.0 standard drinks of alcohol   Drug use: No   Sexual activity: Not on file  Other Topics Concern   Not on file  Social History Narrative   Not on file   Social Determinants of Health   Financial Resource Strain: Low Risk  (01/24/2017)   Overall Financial Resource Strain (CARDIA)    Difficulty of Paying Living Expenses: Not hard at all  Food Insecurity: No Food Insecurity (01/24/2017)   Hunger Vital Sign    Worried About Running Out of Food in the Last Year: Never true    Ran Out of Food in the Last Year: Never true  Transportation Needs: No Transportation Needs (01/24/2017)   PRAPARE - Hydrologist (Medical): No    Lack of Transportation (Non-Medical): No  Physical Activity: Inactive (01/24/2017)   Exercise Vital Sign    Days of Exercise per Week: 0 days    Minutes of Exercise per Session: 0 min  Stress: Stress Concern Present (02/16/2018)   [place]  Feeling of Stress : To some extent  Social Connections: Moderately Integrated (01/24/2017)   Social Connection and Isolation Panel [NHANES]    Frequency of Communication with Friends and Family: More than three times a week    Frequency of Social Gatherings with Friends and Family: Once a week    Attends Religious Services: More than 4 times per year    Active Member of [employer] or Organizations: No    Attends Archivist Meetings: Never    Marital Status: Married  Human resources officer Violence: Not At Risk (01/24/2017)   Humiliation, Afraid, Rape, and Kick questionnaire    Fear of Current or Ex-Partner: No    Emotionally Abused: No    Physically Abused: No    Sexually Abused: No    FAMILY HISTORY: Family History  Problem Relation Age of Onset   Osteoporosis Mother    Hypertension Father    Heart attack Father    Stroke Maternal Grandfather    Breast cancer Sister 24     ALLERGIES:  is allergic to augmentin [amoxicillin-pot clavulanate] and biaxin [clarithromycin].  MEDICATIONS:  Current Outpatient Medications  Medication Sig Dispense Refill   acetaminophen (TYLENOL) 325 MG tablet Take by mouth.     albuterol (VENTOLIN HFA) 108 (90 Base) MCG/ACT inhaler Inhale 2 puffs into the lungs every 6 (six) hours as needed for wheezing or shortness of breath. 8 g 2   atorvastatin (LIPITOR) 10 MG tablet Take 1 tablet (10 mg total) by mouth daily. 90 tablet 4   benazepril (LOTENSIN) 40 MG tablet Take 1 tablet (40 mg total) by mouth daily. 90 tablet 4   Budeson-Glycopyrrol-Formoterol (BREZTRI AEROSPHERE) 160-9-4.8 MCG/ACT AERO Inhale 2 puffs into the lungs in the morning and at bedtime. 10.7 g 11   LORazepam (ATIVAN) 1 MG tablet Take 0.5 tablets (0.5 mg total) by mouth 2 (two) times daily as needed for anxiety. 60 tablet 3   Multiple Vitamins-Minerals (MULTIVITAMIN WITH MINERALS) tablet Take 1 tablet by mouth daily.     PARoxetine (PAXIL) 30 MG tablet Take 1 tablet (30 mg total) by mouth daily. 90 tablet 4   Spacer/Aero-Holding Chambers (OPTICHAMBER DIAMOND) MISC 1 EACH BY OTHER ROUTE ONCE FOR 1 DOSE.     vitamin C (ASCORBIC ACID) 250 MG tablet Take 250 mg by mouth daily.     vitamin E 1000 UNIT capsule Take by mouth.     No current facility-administered medications for this visit.      Marland Kitchen  PHYSICAL EXAMINATION: ECOG PERFORMANCE STATUS: 1 - Symptomatic but completely ambulatory  Vitals:   07/31/21 0828  BP: (!) 145/74  Pulse: 89  Temp: (!) 96.6 F (35.9 C)  SpO2: 99%   Filed Weights   07/31/21 0828  Weight: 148 lb (67.1 kg)    Physical Exam HENT:     Head: Normocephalic and atraumatic.     Mouth/Throat:     Pharynx: No oropharyngeal exudate.  Eyes:     Pupils: Pupils are equal, round, and reactive to light.  Cardiovascular:     Rate and Rhythm: Normal rate and regular rhythm.  Pulmonary:     Effort: No respiratory distress.     Breath sounds:  No wheezing.     Comments: Decreased air entry bilaterally at the bases. Abdominal:     General: Bowel sounds are normal. There is no distension.     Palpations: Abdomen is soft. There is no mass.     Tenderness: There is no abdominal tenderness. There is no  guarding or rebound.  Musculoskeletal:        General: No tenderness. Normal range of motion.     Cervical back: Normal range of motion and neck supple.  Skin:    General: Skin is warm.  Neurological:     Mental Status: She is alert and oriented to person, place, and time.  Psychiatric:        Mood and Affect: Affect normal.      LABORATORY DATA:  I have reviewed the data as listed Lab Results  Component Value Date   WBC 114.7 (HH) 07/31/2021   HGB 12.7 07/31/2021   HCT 40.0 07/31/2021   MCV 101.8 (H) 07/31/2021   PLT 95 (L) 07/31/2021   Recent Labs    06/04/21 0818 07/02/21 0814 07/31/21 0804  NA 141 138 141  K 4.5 4.1 4.0  CL 106 105 104  CO2 29 29 29   GLUCOSE 73 101* 83  BUN 13 11 14   CREATININE 0.72 0.74 0.81  CALCIUM 9.4 9.1 9.2  GFRNONAA >60 >60 >60  PROT 6.7 6.8 7.2  ALBUMIN 4.5 4.3 4.3  AST 26 26 25   ALT 14 13 13   ALKPHOS 94 89 98  BILITOT 1.3* 0.9 1.0    RADIOGRAPHIC STUDIES: I have personally reviewed the radiological images as listed and agreed with the findings in the report. CT Chest Wo Contrast  Result Date: 07/09/2021 CLINICAL DATA:  Pneumonia.  History of CLL. EXAM: CT CHEST WITHOUT CONTRAST TECHNIQUE: Multidetector CT imaging of the chest was performed following the standard protocol without IV contrast. RADIATION DOSE REDUCTION: This exam was performed according to the departmental dose-optimization program which includes automated exposure control, adjustment of the mA and/or kV according to patient size and/or use of iterative reconstruction technique. COMPARISON:  04/08/2021 FINDINGS: Cardiovascular: The heart size is normal. No substantial pericardial effusion. Coronary artery  calcification is evident. Mild atherosclerotic calcification is noted in the wall of the thoracic aorta. Mediastinum/Nodes: No mediastinal lymphadenopathy. No evidence for gross hilar lymphadenopathy although assessment is limited by the lack of intravenous contrast on the current study. The esophagus has normal imaging features. There is no axillary lymphadenopathy. Lungs/Pleura: Centrilobular emphsyema noted. Complete collapse right middle lobe again noted with opacification of the right middle lobe bronchus. Airway impaction to the right lower lobe is mildly progressive in the interval without evidence of associated collapse. Scattered areas of linear atelectasis or scarring are noted in the lung bases bilaterally. There is no evidence of pleural effusion. Upper Abdomen: Similar splenomegaly. Musculoskeletal: No worrisome lytic or sclerotic osseous abnormality. IMPRESSION: 1. Persistent complete collapse right middle lobe with opacification of the right middle lobe bronchus. 2. Interval re-expansion of the left lower lobe with no evidence for airway impaction in the left lower lobe airways. 3. Airway impaction to the right lower lobe is mildly progressive in the interval without evidence of associated collapse. 4. Splenomegaly. 5. Aortic Atherosclerosis (ICD10-I70.0) and Emphysema (ICD10-J43.9). Electronically Signed   By: Misty Stanley M.D.   On: 07/09/2021 07:51    ASSESSMENT & PLAN:   CLL (chronic lymphocytic leukemia) (HCC) #CLL-Rai stage II; FISH 13 q. Deletion-poor tolerance to Ibrutinib-hematuria; ; and Acalburtinib [stopped in March 2022 because of diarrhea].    #From a clinical standpoint patient's CLL is PROGRESSING; ALSO patient's white count is rising-White count is 114 ; hemoglobin 12.5 platelets slightly low at 90s.  Discussed option of using Gazyva-chlorambucil/zanubrutinib.  Patient is quite reluctant with any treatment options-given her poor tolerance to TKI in  the past.  #Secondary  immune deficiency [secondary to CLL; [FEB 2023IgG- 184]-]-multiple infections including recent pneumonia [Feb 2023-[facility]]-continue IVIG infusions 400 mg/kg q monthly x4.  Proceed with #3 today.  We will check quantitative immunoglobulins today;  Discussed that we will plan maintenance infusions based on labs from today.  #COPD/history of pneumonia -bilateral Jan 2023 [QUITsmoking-April, 2023; [facility]]-CT scan February 15- Complete collapse of the right middle lobe and left lower lobe either from nonvisualized endobronchial lesions or retained secretions.  Status post evaluation with Dr.Gonzalez; May 2023-continued  Persistent complete collapse right middle lobe with opacification of the right middle lobe bronchus;  Interval re-expansion of the left lower lobe with no evidence for airway impaction in the left lower lobe airways;  Airway impaction to the right lower lobe is mildly progressive in the interval without evidence of associated collapse; Splenomegaly.  Continue compliance with inhalers and follow-up with pulmonary.  # Skin lesions- ? SCC Left UE-awaiting evaluation Dr.Graham re: skin cancer/ Left UE    Imm in 4 w # DISPOSITION:  # IVG infusion today # Follow up in 4 weeks [wed/thursday] ;MD; labs- cbc/CMP;LDH;IVIG infusion -Dr.B    All questions were answered. The patient knows to call the clinic with any problems, questions or concerns.   Cammie Sickle, MD 08/02/2021 8:33 PM

## 2021-07-31 NOTE — Progress Notes (Signed)
Patient tolerated IVIG infusion well, no questions/concerns voiced. Patient stable at discharge. AVS given.   

## 2021-07-31 NOTE — Assessment & Plan Note (Signed)
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 Deletion-poor tolerance to Ibrutinib-hematuria; ; and Acalburtinib [stopped in March 2022 because of diarrhea].    #From a clinical standpoint patient's CLL is PROGRESSING; however patient's white count is rising-White count is 114 ; hemoglobin 12.5 platelets slightly low at 90s. Given worsening fatigue- recommend gayzva; zabu  .  Recommend continue to monitor/surveillance given patient's poor tolerance to therapies.  Discussed with patient that she may need to consider therapies sooner than later.  # Acute Hypoxia- on chronic-   #Acute diarrhea: 3-4 days-unclear etiology improved with Pepto-Bismol.  Monitor for now.  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  Proceed with #3 today.  We will check quantitative immunoglobulins.  Discussed that we will plan maintenance infusions as needed down the line.  #Pneumonia -bilateral Jan 2023 [QUITsmoking-April, 2023; [facility]]-CT scan February 15- Complete collapse of the right middle lobe and left lower lobe either from nonvisualized endobronchial lesions or retained secretions.  Status post evaluation with Dr.Gonzalez; awaiting repeat imaging.   STABLE. MAY 2023- IMPRESSION: 1. Persistent complete collapse right middle lobe with opacification of the right middle lobe bronchus. 2. Interval re-expansion of the left lower lobe with no evidence for airway impaction in the left lower lobe airways. 3. Airway impaction to the right lower lobe is mildly progressive in the interval without evidence of associated collapse. 4. Splenomegaly.  # Skin lesions- ? SCC Left UE-awaiting evaluation Dr.Graham re: skin cancer/ Left UE   Imm in 4 w # DISPOSITION:  # IVG infusion today # Follow up in 4 weeks [wed/thursday] ;MD; labs- cbc/CMP;LDH;IVIG infusion -Dr.B

## 2021-07-31 NOTE — Progress Notes (Unsigned)
C/o fatigue more so than usual. No energy. Pulm. Doctor advised cough due to asthma and rx'd 2 inhalers.

## 2021-07-31 NOTE — Patient Instructions (Signed)
[facility] [place]  Discharge Instructions: Thank you for choosing [facility] to provide your oncology and hematology care.  If you have a lab appointment with the [facility], please go directly to the [facility] and check in at the registration area.  Wear comfortable clothing and clothing appropriate for easy access to any Portacath or PICC line.   We strive to give you quality time with your provider. You may need to reschedule your appointment if you arrive late (15 or more minutes).  Arriving late affects you and other patients whose appointments are after yours.  Also, if you miss three or more appointments without notifying the office, you may be dismissed from the clinic at the provider's discretion.      For prescription refill requests, have your pharmacy contact our office and allow 72 hours for refills to be completed.    Today you received the following chemotherapy and/or immunotherapy agents: IVIG     To help prevent nausea and vomiting after your treatment, we encourage you to take your nausea medication as directed.  BELOW ARE SYMPTOMS THAT SHOULD BE REPORTED IMMEDIATELY: *FEVER GREATER THAN 100.4 F (38 C) OR HIGHER *CHILLS OR SWEATING *NAUSEA AND VOMITING THAT IS NOT CONTROLLED WITH YOUR NAUSEA MEDICATION *UNUSUAL SHORTNESS OF BREATH *UNUSUAL BRUISING OR BLEEDING *URINARY PROBLEMS (pain or burning when urinating, or frequent urination) *BOWEL PROBLEMS (unusual diarrhea, constipation, pain near the anus) TENDERNESS IN MOUTH AND THROAT WITH OR WITHOUT PRESENCE OF ULCERS (sore throat, sores in mouth, or a toothache) UNUSUAL RASH, SWELLING OR PAIN  UNUSUAL VAGINAL DISCHARGE OR ITCHING   Items with * indicate a potential emergency and should be followed up as soon as possible or go to the Emergency Department if any problems should occur.  Please show the CHEMOTHERAPY ALERT CARD or IMMUNOTHERAPY ALERT CARD at check-in to the  Emergency Department and triage nurse.  Should you have questions after your visit or need to cancel or reschedule your appointment, please contact [facility] [place] AT [place]  [phone] and follow the prompts.  Office hours are 8:00 a.m. to 4:30 p.m. Monday - Friday. Please note that voicemails left after 4:00 p.m. may not be returned until the following business day.  We are closed weekends and major holidays. You have access to a nurse at all times for urgent questions. Please call the main number to the clinic [phone] and follow the prompts.  For any non-urgent questions, you may also contact your provider using MyChart. We now offer e-Visits for anyone 36 and older to request care online for non-urgent symptoms. For details visit [email or web address].   Also download the MyChart app! Go to the app store, search "MyChart", open the app, select [facility], and log in with your MyChart username and password.  Due to Covid, a mask is required upon entering the hospital/clinic. If you do not have a mask, one will be given to you upon arrival. For doctor visits, patients may have 1 support person aged 11 or older with them. For treatment visits, patients cannot have anyone with them due to current Covid guidelines and our immunocompromised population.

## 2021-08-02 ENCOUNTER — Encounter: Payer: Self-pay | Admitting: Internal Medicine

## 2021-08-06 LAB — IMMUNOGLOBULINS A/E/G/M, SERUM
IgA: 16 mg/dL — ABNORMAL LOW (ref 64–422)
IgE (Immunoglobulin E), Serum: <2 IU/mL — ABNORMAL LOW (ref 6–495)
IgG (Immunoglobin G), Serum: 777 mg/dL (ref 586–1602)
IgM (Immunoglobulin M), Srm: <5 mg/dL — ABNORMAL LOW (ref 26–217)

## 2021-08-12 DIAGNOSIS — J449 Chronic obstructive pulmonary disease, unspecified: Secondary | ICD-10-CM | POA: Diagnosis not present

## 2021-08-12 DIAGNOSIS — J479 Bronchiectasis, uncomplicated: Secondary | ICD-10-CM | POA: Diagnosis not present

## 2021-08-17 ENCOUNTER — Encounter: Payer: Self-pay | Admitting: Nurse Practitioner

## 2021-08-17 ENCOUNTER — Ambulatory Visit (INDEPENDENT_AMBULATORY_CARE_PROVIDER_SITE_OTHER): Payer: Medicare HMO | Admitting: Nurse Practitioner

## 2021-08-17 VITALS — BP 114/69 | HR 67 | Temp 97.6°F | Ht 66.0 in | Wt 146.0 lb

## 2021-08-17 DIAGNOSIS — D692 Other nonthrombocytopenic purpura: Secondary | ICD-10-CM | POA: Diagnosis not present

## 2021-08-17 DIAGNOSIS — J432 Centrilobular emphysema: Secondary | ICD-10-CM

## 2021-08-17 DIAGNOSIS — F17210 Nicotine dependence, cigarettes, uncomplicated: Secondary | ICD-10-CM

## 2021-08-17 DIAGNOSIS — Z79899 Other long term (current) drug therapy: Secondary | ICD-10-CM

## 2021-08-17 DIAGNOSIS — R69 Illness, unspecified: Secondary | ICD-10-CM | POA: Diagnosis not present

## 2021-08-17 DIAGNOSIS — C9110 Chronic lymphocytic leukemia of B-cell type not having achieved remission: Secondary | ICD-10-CM

## 2021-08-17 DIAGNOSIS — F419 Anxiety disorder, unspecified: Secondary | ICD-10-CM

## 2021-08-17 DIAGNOSIS — F339 Major depressive disorder, recurrent, unspecified: Secondary | ICD-10-CM

## 2021-08-17 MED ORDER — LORAZEPAM 1 MG PO TABS: 0.5000 mg | ORAL_TABLET | Freq: Two times a day (BID) | ORAL | 3 refills | Status: DC | PRN

## 2021-08-17 NOTE — Assessment & Plan Note (Signed)
Recently quit, recommend continued cessation. 

## 2021-08-26 ENCOUNTER — Other Ambulatory Visit: Payer: Medicare HMO

## 2021-08-26 ENCOUNTER — Ambulatory Visit: Payer: Medicare HMO | Admitting: Internal Medicine

## 2021-08-26 ENCOUNTER — Inpatient Hospital Stay: Payer: Medicare HMO | Admitting: Internal Medicine

## 2021-08-26 ENCOUNTER — Inpatient Hospital Stay: Payer: Medicare HMO | Attending: Internal Medicine

## 2021-08-26 DIAGNOSIS — Z803 Family history of malignant neoplasm of breast: Secondary | ICD-10-CM | POA: Insufficient documentation

## 2021-08-26 DIAGNOSIS — F32A Depression, unspecified: Secondary | ICD-10-CM | POA: Insufficient documentation

## 2021-08-26 DIAGNOSIS — C9110 Chronic lymphocytic leukemia of B-cell type not having achieved remission: Secondary | ICD-10-CM | POA: Diagnosis not present

## 2021-08-26 DIAGNOSIS — R5383 Other fatigue: Secondary | ICD-10-CM | POA: Diagnosis not present

## 2021-08-26 DIAGNOSIS — J449 Chronic obstructive pulmonary disease, unspecified: Secondary | ICD-10-CM | POA: Diagnosis not present

## 2021-08-26 DIAGNOSIS — Z8701 Personal history of pneumonia (recurrent): Secondary | ICD-10-CM | POA: Insufficient documentation

## 2021-08-26 DIAGNOSIS — L989 Disorder of the skin and subcutaneous tissue, unspecified: Secondary | ICD-10-CM | POA: Diagnosis not present

## 2021-08-26 DIAGNOSIS — J9610 Chronic respiratory failure, unspecified whether with hypoxia or hypercapnia: Secondary | ICD-10-CM | POA: Insufficient documentation

## 2021-08-26 DIAGNOSIS — F419 Anxiety disorder, unspecified: Secondary | ICD-10-CM | POA: Insufficient documentation

## 2021-08-26 DIAGNOSIS — D8489 Other immunodeficiencies: Secondary | ICD-10-CM | POA: Insufficient documentation

## 2021-08-26 DIAGNOSIS — Z87891 Personal history of nicotine dependence: Secondary | ICD-10-CM | POA: Diagnosis not present

## 2021-08-26 DIAGNOSIS — R69 Illness, unspecified: Secondary | ICD-10-CM | POA: Diagnosis not present

## 2021-08-26 LAB — COMPREHENSIVE METABOLIC PANEL
ALT: 10 U/L (ref 0–44)
AST: 21 U/L (ref 15–41)
Albumin: 4.1 g/dL (ref 3.5–5.0)
Alkaline Phosphatase: 94 U/L (ref 38–126)
Anion gap: 6 (ref 5–15)
BUN: 13 mg/dL (ref 8–23)
CO2: 28 mmol/L (ref 22–32)
Calcium: 8.9 mg/dL (ref 8.9–10.3)
Chloride: 106 mmol/L (ref 98–111)
Creatinine, Ser: 0.93 mg/dL (ref 0.44–1.00)
GFR, Estimated: >60 mL/min (ref >60)
Glucose, Bld: 88 mg/dL (ref 70–99)
Potassium: 4 mmol/L (ref 3.5–5.1)
Sodium: 140 mmol/L (ref 135–145)
Total Bilirubin: 1.3 mg/dL — ABNORMAL HIGH (ref 0.3–1.2)
Total Protein: 7 g/dL (ref 6.5–8.1)

## 2021-08-26 LAB — CBC WITH DIFFERENTIAL/PLATELET
Abs Immature Granulocytes: 0.14 10*3/uL — ABNORMAL HIGH (ref 0.00–0.07)
Basophils Absolute: 0.1 10*3/uL (ref 0.0–0.1)
Basophils Relative: 0 %
Eosinophils Absolute: 0.2 10*3/uL (ref 0.0–0.5)
Eosinophils Relative: 0 %
HCT: 39.4 % (ref 36.0–46.0)
Hemoglobin: 12.5 g/dL (ref 12.0–15.0)
Immature Granulocytes: 0 %
Lymphocytes Relative: 94 %
Lymphs Abs: 108.5 10*3/uL — ABNORMAL HIGH (ref 0.7–4.0)
MCH: 32.6 pg (ref 26.0–34.0)
MCHC: 31.7 g/dL (ref 30.0–36.0)
MCV: 102.9 fL — ABNORMAL HIGH (ref 80.0–100.0)
Monocytes Absolute: 3.5 10*3/uL — ABNORMAL HIGH (ref 0.1–1.0)
Monocytes Relative: 3 %
Neutro Abs: 3.2 10*3/uL (ref 1.7–7.7)
Neutrophils Relative %: 3 %
Platelets: 93 10*3/uL — ABNORMAL LOW (ref 150–400)
RBC: 3.83 MIL/uL — ABNORMAL LOW (ref 3.87–5.11)
RDW: 13.8 % (ref 11.5–15.5)
Smear Review: NORMAL
WBC: 115.6 10*3/uL (ref 4.0–10.5)
nRBC: 0 % (ref 0.0–0.2)

## 2021-08-26 LAB — LACTATE DEHYDROGENASE: LDH: 141 U/L (ref 98–192)

## 2021-08-26 NOTE — Progress Notes (Signed)
[place] NOTE  Patient Care Team: Venita Lick, NP as PCP - General (Nurse Practitioner) [name], MD as PCP - Family Medicine (Family Medicine) Oneta Rack, MD (Dermatology) Manya Silvas, MD (Inactive) (Gastroenterology) Bary Castilla, Forest Gleason, MD (General Surgery) Cammie Sickle, MD as Consulting Physician (Oncology) Cammie Sickle, MD as Consulting Physician (Internal Medicine)  CHIEF COMPLAINTS/PURPOSE OF CONSULTATION:  CLL  #  Oncology History Overview Note  Katherine [name] is a 81 y.o. female with chronic lymphocytic leukemia (CLL).  WBC has ranged between 22,000 - 101,7000 since 05/2011.   She has received Rituxan x 2 four week cycles (06/27/2015 and 05/27/2017).  Hepatitis B surface antigen and hepatitis B surface antibody were negative on 07/04/2015.   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  CCND1, ATM, chromosome 12, and TP53 were normal.     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  There was a CD5 and CD23 positive monoclonal B cell population with lambda light chain restriction, negative for FMC7 and CD38, representing  88% of leukocytes, >5,000/uL. There was no loss of, or aberrant expression of, the pan T cell  antigens to  suggest a neoplastic T cell process. CD4:CD8 ratio 2.0  No circulating blasts were detected. There was no immunophenotypic evidence of abnormal myeloid maturation.  IGH/BCL2 by FISH is pending. --------------------------------------------------------  She began ibrutinib on 04/24/2019 (discontinued on 05/30/2019).                     She developed blood-streaked sputum (slight) then significant hematuria.                     She declined further ibrutinib. ---------------------------------------------------------------------------------             She began acalabrutinib on 03/14/2020 (held on 05/13/2020 secondary to diarrhea). --------------------------------------------------------------------------------------   She has a 35 pack year smoking history.  She is in the low dose chest CT program.  Low dose chest CT on 02/02/2018 revealed a new endobronchial lesion in the segmental bronchus to the medial segment of the right middle lobe. This may simply reflect an area of retained secretions, however, the possibility of an endobronchial neoplasm should be considered. Lung-RADS 4AS, suspicious.  Low dose chest CT on 07/05/2018 revealed Lung-RADS 2, benign appearance or behavior.  Prior right middle lobe endobronchial lesion/mucous plugging was no longer visualized.  New mucous plugging in the right lower lobe.  #Secondary immune deficiency [secondary to CLL; [FEB 2023IgG- 184]-]-multiple infections pneumonia [Feb 2023-[facility]]-IVIG infusions 400 mg/kg q   CLL (chronic lymphocytic leukemia) ([place])  05/28/2015 Initial Diagnosis   CLL (chronic lymphocytic leukemia) (HCC)    HISTORY OF PRESENTING ILLNESS: Frail-appearing Caucasian female patient.  Accompanied by her daughter.  Katherine [name] 81 y.o.  female CLL-13 q. Deletion currently on surveillance because of intolerance/side effects to multiple TKIs is here for follow-up/proceed with IVIG infusion given recent pneumonia.  Patient continues to feel tired.  Continues to be short of breath.  She states to be using inhalers as recommended.  Had episode of constipation currently improved.  Chronic mild swelling in the legs.  No blood in stools or black or stools.  Denies any new lumps or bumps.  Review of Systems  Constitutional:  Positive for malaise/fatigue. Negative for chills, diaphoresis, fever and weight loss.  HENT:  Negative for nosebleeds and sore throat.   Eyes:  Negative for double vision.  Respiratory:  Positive for hemoptysis and shortness of breath. Negative for cough, sputum  production and wheezing.   Cardiovascular:  Negative for chest pain, palpitations, orthopnea and leg swelling.  Gastrointestinal:  Negative for abdominal pain, blood in stool, constipation, diarrhea, heartburn, melena, nausea and vomiting.  Genitourinary:  Negative for dysuria, frequency and urgency.  Musculoskeletal:  Positive for back pain. Negative for joint pain.  Skin: Negative.  Negative for itching and rash.  Neurological:  Negative for dizziness, tingling, focal weakness, weakness and headaches.  Endo/Heme/Allergies:  Does not bruise/bleed easily.  Psychiatric/Behavioral:  Negative for depression. The patient is not nervous/anxious and does not have insomnia.      MEDICAL HISTORY:  Past Medical History:  Diagnosis Date   Allergy    Anxiety    Depression    GERD (gastroesophageal reflux disease)    Hemorrhoids    Hypertension    Leukemia, lymphoid ([place])    CLL   Lobar pneumonia (HCC)    Osteopenia    Personal history of chemotherapy     SURGICAL HISTORY: Past Surgical History:  Procedure Laterality Date   ABDOMINAL HYSTERECTOMY     APPENDECTOMY     BREAST BIOPSY Left    bx x 3-neg   COLON SURGERY     sigmoid resection   COLONOSCOPY     2007, 2012   COLONOSCOPY WITH PROPOFOL N/A 09/17/2015   Procedure: COLONOSCOPY WITH PROPOFOL;  Surgeon: Robert Bellow, MD;  Location: [facility] ENDOSCOPY;  Service: Endoscopy;  Laterality: N/A;   OOPHORECTOMY     SPINE SURGERY     L4-5    SOCIAL HISTORY: Social History   Socioeconomic History   Marital status: Married    Spouse name: Not on file   Number of children: Not on file   Years of education: 12   Highest education level: 12th grade  Occupational History   Not on file  Tobacco Use   Smoking status: Former    Packs/day: 1.00    Years: 35.00    Total pack years: 35.00    Types: Cigarettes    Quit date: 02/22/2021    Years since quitting: 0.5   Smokeless tobacco: Never  Vaping Use   Vaping Use: Never used   Substance and Sexual Activity   Alcohol use: No    Alcohol/week: 0.0 standard drinks of alcohol   Drug use: No   Sexual activity: Not on file  Other Topics Concern   Not on file  Social History Narrative   Not on file   Social Determinants of Health   Financial Resource Strain: Low Risk  (01/24/2017)   Overall Financial Resource Strain (CARDIA)    Difficulty of Paying Living Expenses: Not hard at all  Food Insecurity: No Food Insecurity (01/24/2017)   Hunger Vital Sign    Worried About Running Out of Food in the Last Year: Never true    Ran Out of Food in the Last Year: Never true  Transportation Needs: No Transportation Needs (01/24/2017)   PRAPARE - Hydrologist (Medical): No    Lack of Transportation (Non-Medical): No  Physical Activity: Inactive (01/24/2017)   Exercise Vital Sign    Days of Exercise per Week: 0 days    Minutes of Exercise per Session: 0 min  Stress: Stress Concern Present (02/16/2018)   [place]  Feeling of Stress : To some extent  Social Connections: Moderately Integrated (01/24/2017)   Social Connection and Isolation Panel [NHANES]    Frequency of Communication with Friends and Family: More than three times a week    Frequency of Social Gatherings with Friends and Family: Once a week    Attends Religious Services: More than 4 times per year    Active Member of [employer] or Organizations: No    Attends Archivist Meetings: Never    Marital Status: Married  Human resources officer Violence: Not At Risk (01/24/2017)   Humiliation, Afraid, Rape, and Kick questionnaire    Fear of Current or Ex-Partner: No    Emotionally Abused: No    Physically Abused: No    Sexually Abused: No    FAMILY HISTORY: Family History  Problem Relation Age of Onset   Osteoporosis Mother    Hypertension Father    Heart attack Father    Stroke Maternal Grandfather    Breast cancer  Sister 33    ALLERGIES:  is allergic to augmentin [amoxicillin-pot clavulanate] and biaxin [clarithromycin].  MEDICATIONS:  Current Outpatient Medications  Medication Sig Dispense Refill   acetaminophen (TYLENOL) 325 MG tablet Take by mouth.     albuterol (VENTOLIN HFA) 108 (90 Base) MCG/ACT inhaler Inhale 2 puffs into the lungs every 6 (six) hours as needed for wheezing or shortness of breath. 8 g 2   atorvastatin (LIPITOR) 10 MG tablet Take 1 tablet (10 mg total) by mouth daily. 90 tablet 4   benazepril (LOTENSIN) 40 MG tablet Take 1 tablet (40 mg total) by mouth daily. 90 tablet 4   Budeson-Glycopyrrol-Formoterol (BREZTRI AEROSPHERE) 160-9-4.8 MCG/ACT AERO Inhale 2 puffs into the lungs in the morning and at bedtime. 10.7 g 11   LORazepam (ATIVAN) 1 MG tablet Take 0.5 tablets (0.5 mg total) by mouth 2 (two) times daily as needed for anxiety. 60 tablet 3   Multiple Vitamins-Minerals (MULTIVITAMIN WITH MINERALS) tablet Take 1 tablet by mouth daily.     PARoxetine (PAXIL) 30 MG tablet Take 1 tablet (30 mg total) by mouth daily. 90 tablet 4   Spacer/Aero-Holding Chambers (OPTICHAMBER DIAMOND) MISC 1 EACH BY OTHER ROUTE ONCE FOR 1 DOSE.     vitamin C (ASCORBIC ACID) 250 MG tablet Take 250 mg by mouth daily.     vitamin E 1000 UNIT capsule Take by mouth.     No current facility-administered medications for this visit.      Marland Kitchen  PHYSICAL EXAMINATION: ECOG PERFORMANCE STATUS: 1 - Symptomatic but completely ambulatory  Vitals:   08/26/21 1131  BP: 110/63  Pulse: 66  Temp: (!) 96.8 F (36 C)  SpO2: 98%   Filed Weights   08/26/21 1131  Weight: 147 lb 8 oz (66.9 kg)    Physical Exam HENT:     Head: Normocephalic and atraumatic.     Mouth/Throat:     Pharynx: No oropharyngeal exudate.  Eyes:     Pupils: Pupils are equal, round, and reactive to light.  Cardiovascular:     Rate and Rhythm: Normal rate and regular rhythm.  Pulmonary:     Effort: No respiratory distress.      Breath sounds: No wheezing.     Comments: Decreased air entry bilaterally at the bases. Abdominal:     General: Bowel sounds are normal. There is no distension.     Palpations: Abdomen is soft. There is no mass.     Tenderness: There is no abdominal tenderness. There is  no guarding or rebound.  Musculoskeletal:        General: No tenderness. Normal range of motion.     Cervical back: Normal range of motion and neck supple.  Skin:    General: Skin is warm.  Neurological:     Mental Status: She is alert and oriented to person, place, and time.  Psychiatric:        Mood and Affect: Affect normal.      LABORATORY DATA:  I have reviewed the data as listed Lab Results  Component Value Date   WBC 115.6 (HH) 08/26/2021   HGB 12.5 08/26/2021   HCT 39.4 08/26/2021   MCV 102.9 (H) 08/26/2021   PLT 93 (L) 08/26/2021   Recent Labs    07/02/21 0814 07/31/21 0804 08/26/21 1045  NA 138 141 140  K 4.1 4.0 4.0  CL 105 104 106  CO2 _0 GLUCOSE 101* 83 88  BUN _1 CREATININE 0.74 0.81 0.93  CALCIUM 9.1 9.2 8.9  GFRNONAA >60 >60 >60  PROT 6.8 7.2 7.0  ALBUMIN 4.3 4.3 4.1  AST _2 ALT _3 ALKPHOS 89 98 94  BILITOT 0.9 1.0 1.3*    RADIOGRAPHIC STUDIES: I have personally reviewed the radiological images as listed and agreed with the findings in the report. No results found.  ASSESSMENT & PLAN:   CLL (chronic lymphocytic leukemia) ([place]) #CLL-Rai stage II; FISH 13 q. Deletion-poor tolerance to Ibrutinib-hematuria; ; and Acalburtinib [stopped in March 2022 because of diarrhea].    #From a clinical standpoint patient's CLL is PROGRESSING; ALSO patient's white count is rising-White count is 115-but overall stable in the last few months.  Currently-hemoglobin 12.5 platelets slightly low at 90s.  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  Proceed with #3 today.  JUNE immunoglobulins- IgG- 700+.   Hold off IVIG infusions for now.  #COPD/history of pneumonia -bilateral Jan 2023 [QUITsmoking-April, 2023; [facility]]-right middle lobe lung atelectasis- status post evaluation with Dr.Gonzalez; May 2023-continued  Persistent complete collapse right middle lobe with opacification of the right middle lobe bronchus; with some improvement in the lower lobe.  Continue compliance with inhalers and follow-up with pulmonary.  Overall stable  # Skin lesions- ? SCC Left UE-recommend evaluation Dr.Graham re: skin cancer/ Left UE  #Ongoing fatigue multifactorial-underlying chronic respiratory failure; underlying CLL; possible depression/anxiety.  Reasonable to hold any treatment at this time.  If continued get worse would recommend treatment for CLL.    Imm in 4 w # DISPOSITION:  # CANCEL  IVG infusion TOMORROW # Follow up in 2 months [wed/thursday] ;MD; labs- CBC/CMP;LDH;quantitative immunoglobulins -Dr.B     All questions were answered. The patient knows to call the clinic with any problems, questions or concerns.   Cammie Sickle, MD 08/26/2021 2:51 PM

## 2021-08-26 NOTE — Assessment & Plan Note (Addendum)
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 Deletion-poor tolerance to Ibrutinib-hematuria; ; and Acalburtinib [stopped in March 2022 because of diarrhea].    #From a clinical standpoint patient's CLL is PROGRESSING; ALSO patient's white count is rising-White count is 115-but overall stable in the last few months.  Currently-hemoglobin 12.5 platelets slightly low at 90s.  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  Proceed with #3 today.  JUNE immunoglobulins- IgG- 700+.   Hold off IVIG infusions for now.  #COPD/history of pneumonia -bilateral Jan 2023 [QUITsmoking-April, 2023; [facility]]-right middle lobe lung atelectasis- status post evaluation with Dr.Gonzalez; May 2023-continued  Persistent complete collapse right middle lobe with opacification of the right middle lobe bronchus; with some improvement in the lower lobe.  Continue compliance with inhalers and follow-up with pulmonary.  Overall stable  # Skin lesions- ? SCC Left UE-recommend evaluation Dr.Graham re: skin cancer/ Left UE  #Ongoing fatigue multifactorial-underlying chronic respiratory failure; underlying CLL; possible depression/anxiety.  Reasonable to hold any treatment at this time.  If continued get worse would recommend treatment for CLL.   Imm in 4 w # DISPOSITION:  # CANCEL  IVG infusion TOMORROW # Follow up in 2 months [wed/thursday] ;MD; labs- CBC/CMP;LDH;quantitative immunoglobulins -Dr.B

## 2021-08-27 ENCOUNTER — Inpatient Hospital Stay: Payer: Medicare HMO

## 2021-08-27 ENCOUNTER — Ambulatory Visit: Payer: Medicare HMO

## 2021-08-28 ENCOUNTER — Other Ambulatory Visit: Payer: Medicare HMO

## 2021-08-28 ENCOUNTER — Ambulatory Visit: Payer: Medicare HMO | Admitting: Internal Medicine

## 2021-09-11 DIAGNOSIS — J479 Bronchiectasis, uncomplicated: Secondary | ICD-10-CM | POA: Diagnosis not present

## 2021-09-11 DIAGNOSIS — J449 Chronic obstructive pulmonary disease, unspecified: Secondary | ICD-10-CM | POA: Diagnosis not present

## 2021-10-12 DIAGNOSIS — J479 Bronchiectasis, uncomplicated: Secondary | ICD-10-CM | POA: Diagnosis not present

## 2021-10-12 DIAGNOSIS — J449 Chronic obstructive pulmonary disease, unspecified: Secondary | ICD-10-CM | POA: Diagnosis not present

## 2021-10-14 ENCOUNTER — Encounter: Payer: Self-pay | Admitting: Pulmonary Disease

## 2021-10-14 ENCOUNTER — Ambulatory Visit: Payer: Medicare HMO | Admitting: Pulmonary Disease

## 2021-10-14 VITALS — BP 122/74 | HR 70 | Temp 97.7°F | Ht 63.78 in | Wt 147.8 lb

## 2021-10-14 DIAGNOSIS — J3089 Other allergic rhinitis: Secondary | ICD-10-CM

## 2021-10-14 DIAGNOSIS — R0989 Other specified symptoms and signs involving the circulatory and respiratory systems: Secondary | ICD-10-CM | POA: Diagnosis not present

## 2021-10-14 DIAGNOSIS — D803 Selective deficiency of immunoglobulin G [IgG] subclasses: Secondary | ICD-10-CM

## 2021-10-14 DIAGNOSIS — J479 Bronchiectasis, uncomplicated: Secondary | ICD-10-CM

## 2021-10-14 DIAGNOSIS — J302 Other seasonal allergic rhinitis: Secondary | ICD-10-CM

## 2021-10-14 DIAGNOSIS — J449 Chronic obstructive pulmonary disease, unspecified: Secondary | ICD-10-CM | POA: Diagnosis not present

## 2021-10-14 DIAGNOSIS — C9110 Chronic lymphocytic leukemia of B-cell type not having achieved remission: Secondary | ICD-10-CM | POA: Diagnosis not present

## 2021-10-14 DIAGNOSIS — Z87891 Personal history of nicotine dependence: Secondary | ICD-10-CM | POA: Diagnosis not present

## 2021-10-14 MED ORDER — BREZTRI AEROSPHERE 160-9-4.8 MCG/ACT IN AERO: 2.0000 | INHALATION_SPRAY | Freq: Two times a day (BID) | RESPIRATORY_TRACT | 0 refills | Status: DC

## 2021-10-14 MED ORDER — MOMETASONE FUROATE 50 MCG/ACT NA SUSP: NASAL | 3 refills | Status: DC

## 2021-10-14 NOTE — Patient Instructions (Signed)
We have sent in a prescription for Nasonex for your nasal symptoms.  This is 1 spray twice a day to each nostril.  Check with [facility] to see if getting your Breztri through mail order may be cheaper.  Other possibilities include checking with GoodRx.  We are scheduling you for a repeat scan of the chest to reevaluate the findings that we noted previously.  Keep using your vest.  We provided you with Breztri  samples today.  We will see you in follow-up in 3 months time call sooner should any new problems arise.

## 2021-10-14 NOTE — Progress Notes (Signed)
Subjective:    Patient ID: Katherine [name], female    DOB: 04-06-1940, 81 y.o.   MRN: [id number] Patient Care Team: Venita Lick, NP as PCP - General (Nurse Practitioner) [name], MD as PCP - Family Medicine (Family Medicine) Oneta Rack, MD (Dermatology) Manya Silvas, MD (Inactive) (Gastroenterology) Bary Castilla, Forest Gleason, MD (General Surgery) Cammie Sickle, MD as Consulting Physician (Oncology) Cammie Sickle, MD as Consulting Physician (Internal Medicine)  Chief Complaint  Patient presents with   Follow-up    Breathing is overall doing well. She has occ cough after uses vest- prod with clear sputum.    HPI This is an 81 year old recent former smoker (quit January 2023) with 35-pack-year history of smoking who presents for follow-up on abnormal chest CT. she was last seen here on 30 July 2021.  At that time she was prescribed and InCourage vest to manage mucus plugging due to bronchiectasis.  She has moderate COPD by prior PFTs.  Previously, she had resolution of left lower lobe atelectasis after aggressive pulmonary toilet and management of her COPD.  She has a history of CLL and IgG deficiency and bronchiectasis.  Since starting the vest physiotherapy she has noted that her sputum is less tenacious and easier to bring up.  She uses therapy twice a day.  She has been religious with this.  She is compliant with Breztri 2 puffs twice a day.  Her only complaint today is some issues with nasal congestion which occurs seasonally.  She has been getting nasal steroid over-the-counter but it is costing her quite a bit.  She would like a prescription for a nasal steroid spray.  These are usually helpful to her.  She gets IVIG infusions for her IgG deficiency related to her CLL.   Her last CT chest was on 08 Jul 2021 she is due for reimaging.  The patient does not endorse any fevers, chills or sweats.  Cough has improved dramatically since starting on vest  chest physiotherapy.  She has not had any hemoptysis.  No chest pain.  During her last encounter she was noted to be quite debilitated and appeared frail.  Today she is fully ambulatory and quite spry.  Review of Systems A 10 point review of systems was performed and it is as noted above otherwise negative.   Patient Active Problem List   Diagnosis Date Noted   Type O blood, Rh negative 05/09/2021   Secondary immune deficiency disorder ([place]) 04/19/2021   Long-term current use of benzodiazepine 02/05/2020   Splenomegaly 02/02/2020   Thrombocytopenia ([place]) 02/02/2020   Allergic rhinitis 07/18/2018   Goals of care, counseling/discussion 07/05/2018   Nicotine dependence, cigarettes, uncomplicated 93/23/5573   Centrilobular emphysema ([place]) 02/05/2018   Advanced care planning/counseling discussion 01/09/2018   Anxiety 01/04/2017   Hypercholesterolemia 01/08/2016   Atherosclerosis of aorta ([employer]) 01/07/2016   History of colonic polyps 08/14/2015   CLL (chronic lymphocytic leukemia) ([place]) 05/28/2015   Senile purpura ([place]) 01/06/2015   Essential hypertension 01/06/2015   Depression, recurrent ([place]) 01/06/2015   Social History   Tobacco Use   Smoking status: Former    Packs/day: 1.00    Years: 35.00    Total pack years: 35.00    Types: Cigarettes    Quit date: 02/22/2021    Years since quitting: 0.6   Smokeless tobacco: Never  Substance Use Topics   Alcohol use: No    Alcohol/week: 0.0 standard drinks of alcohol   Allergies  Allergen  Reactions   Augmentin [Amoxicillin-Pot Clavulanate] Swelling    tongue   Biaxin [Clarithromycin] Swelling and Rash    tongue   Current Meds  Medication Sig   acetaminophen (TYLENOL) 325 MG tablet Take by mouth.   albuterol (VENTOLIN HFA) 108 (90 Base) MCG/ACT inhaler Inhale 2 puffs into the lungs every 6 (six) hours as needed for wheezing or shortness of breath.   atorvastatin (LIPITOR) 10 MG tablet Take 1 tablet (10 mg total) by mouth daily.    benazepril (LOTENSIN) 40 MG tablet Take 1 tablet (40 mg total) by mouth daily.   Budeson-Glycopyrrol-Formoterol (BREZTRI AEROSPHERE) 160-9-4.8 MCG/ACT AERO Inhale 2 puffs into the lungs in the morning and at bedtime.   LORazepam (ATIVAN) 1 MG tablet Take 0.5 tablets (0.5 mg total) by mouth 2 (two) times daily as needed for anxiety.   Multiple Vitamins-Minerals (MULTIVITAMIN WITH MINERALS) tablet Take 1 tablet by mouth daily.   PARoxetine (PAXIL) 30 MG tablet Take 1 tablet (30 mg total) by mouth daily.   Spacer/Aero-Holding Chambers (OPTICHAMBER DIAMOND) MISC 1 EACH BY OTHER ROUTE ONCE FOR 1 DOSE.   vitamin C (ASCORBIC ACID) 250 MG tablet Take 250 mg by mouth daily.   vitamin E 1000 UNIT capsule Take by mouth.   Immunization History  Administered Date(s) Administered   Fluad Quad(high Dose 65+) 11/08/2019, 11/12/2020   Influenza, High Dose Seasonal PF 01/04/2017, 11/07/2018   Influenza,inj,Quad PF,6+ Mos 01/06/2015, 11/21/2015, 10/07/2017   Influenza-Unspecified 01/06/2015, 11/21/2015, 01/04/2017, 10/07/2017, 11/07/2018, 12/03/2020   PFIZER(Purple Top)SARS-COV-2 Vaccination 05/11/2019, 06/01/2019, 02/05/2020   Pneumococcal Conjugate-13 03/06/2014   Pneumococcal-Unspecified 02/22/1993, 02/23/2003   Td 07/24/2003   Tdap 11/18/2010   Zoster Recombinat (Shingrix) 08/02/2017, 02/04/2018   Zoster, Live 11/03/2005      Objective:   Physical Exam BP 122/74 (BP Location: Left Arm, Cuff Size: Normal)   Pulse 70   Temp 97.7 F (36.5 C) (Oral)   Ht 5' 3.78" (1.62 m)   Wt 147 lb 12.8 oz (67 kg)   SpO2 96% Comment: on RA  BMI 25.55 kg/m  GENERAL: Well-developed, thin woman, well-groomed, chronically ill-appearing, no acute distress, presents in transport chair.  She appears quite debilitated today. HEAD: Normocephalic, atraumatic.  EYES: Pupils equal, round, reactive to light.  No scleral icterus.  MOUTH: Oral mucosa moist.  Dentition intact. NECK: Supple. No thyromegaly. Trachea midline. No  JVD.  No adenopathy. PULMONARY: Good air entry bilaterally.  No adventitious sounds. CARDIOVASCULAR: S1 and S2. Regular rate and rhythm.  No rubs, murmurs or gallops heard. ABDOMEN: Benign. MUSCULOSKELETAL: No joint deformity, no clubbing, trace to no edema of the feet/ankles noted today. NEUROLOGIC: No focal deficit noted, no gait disturbance, speech is fluent. SKIN: Intact,warm,dry.  Multiple ecchymosis/senile purpura particularly upper extremities. PSYCH: Mood and behavior normal.     Assessment & Plan:     ICD-10-CM   1. Bronchiectasis without complication ([place])  Y70.6    Doing well with chest vest physiotherapy Continue same    2. Stage 2 moderate COPD by GOLD classification (HCC)  J44.9    Continue Breztri 2 puffs twice a day Continue as needed albuterol    3. Abnormal finding of lung  R09.89 CT CHEST WO CONTRAST   Previously noted left lobe atelectasis Right middle lobe collapse Mucoid impaction Repeat CT chest    4. IgG deficiency ([place])  D80.3    Related to CLL This issue adds complexity to her management Getting supplemental IVIG    5. CLL (chronic lymphocytic leukemia) (HCC)  C91.10  6. Perennial allergic rhinitis with seasonal variation  J30.89    J30.2    Nasonex 1 spray to each nostril twice daily    7. Former cigarette smoker  Z87.891    Remains abstinent from cigarettes Commended on quitting smoking     Orders Placed This Encounter  Procedures   CT CHEST WO CONTRAST    Standing Status:   Future    Standing Expiration Date:   10/15/2022    Scheduling Instructions:     Next available.    Order Specific Question:   Preferred imaging location?    Answer:   [facility] ordered this encounter  Medications   Budeson-Glycopyrrol-Formoterol (BREZTRI AEROSPHERE) 160-9-4.8 MCG/ACT AERO    Sig: Inhale 2 puffs into the lungs in the morning and at bedtime.    Dispense:  5.9 g    Refill:  0    Order Specific Question:   Lot Number?    Answer:    [id number]    Order Specific Question:   Expiration Date?    Answer:   02/23/2024    Order Specific Question:   Manufacturer?    Answer:   [employer] [[id number]]    Order Specific Question:   Quantity    Answer:   2   mometasone (NASONEX) 50 MCG/ACT nasal spray    Sig: 1 spray to each nostril twice a day    Dispense:  17 g    Refill:  3   Patient overall appears to be doing well today.  She is benefiting from vest physiotherapy.  We will see her in follow-up in 3 months time she is to contact us prior to that time should any new difficulties arise.  Renold Don, MD Advanced Bronchoscopy PCCM [facility]-    *This note was dictated using voice recognition software/Dragon.  Despite best efforts to proofread, errors can occur which can change the meaning. Any transcriptional errors that result from this process are unintentional and may not be fully corrected at the time of dictation.

## 2021-10-17 ENCOUNTER — Other Ambulatory Visit: Payer: Self-pay | Admitting: Pulmonary Disease

## 2021-10-20 ENCOUNTER — Telehealth: Payer: Self-pay

## 2021-10-20 ENCOUNTER — Telehealth: Payer: Self-pay | Admitting: Pulmonary Disease

## 2021-10-20 NOTE — Telephone Encounter (Signed)
Copied from [place] [id number]. Topic: General - Other >> Oct 20, 2021 12:44 PM Cyndi Bender wrote: Reason for CRM: Dawn with [employer] requests call back in regards to authorization. Cb# [phone] Ext [phone]

## 2021-10-20 NOTE — Telephone Encounter (Signed)
I sent Dr. Patsey Berthold note and the last CT report

## 2021-10-20 NOTE — Telephone Encounter (Signed)
We have Mrs. Katherine [name] CT scheduled on 10/21/2021 and her insurance has denied the CT

## 2021-10-20 NOTE — Telephone Encounter (Signed)
Continue to send my note?  She does have abnormalities that require follow-up.

## 2021-10-20 NOTE — Telephone Encounter (Signed)
Called and spoke to [place] at [employer]. Let her know that we did not order the imaging for the patient, and that her pulmonologist did.

## 2021-10-21 ENCOUNTER — Ambulatory Visit: Payer: Medicare HMO

## 2021-10-22 DIAGNOSIS — D485 Neoplasm of uncertain behavior of skin: Secondary | ICD-10-CM | POA: Diagnosis not present

## 2021-10-22 DIAGNOSIS — D692 Other nonthrombocytopenic purpura: Secondary | ICD-10-CM | POA: Diagnosis not present

## 2021-10-22 DIAGNOSIS — D0462 Carcinoma in situ of skin of left upper limb, including shoulder: Secondary | ICD-10-CM | POA: Diagnosis not present

## 2021-10-29 ENCOUNTER — Inpatient Hospital Stay: Payer: Medicare HMO | Admitting: Internal Medicine

## 2021-10-29 ENCOUNTER — Inpatient Hospital Stay: Payer: Medicare HMO | Attending: Internal Medicine

## 2021-10-29 ENCOUNTER — Encounter: Payer: Self-pay | Admitting: Internal Medicine

## 2021-10-29 DIAGNOSIS — Z803 Family history of malignant neoplasm of breast: Secondary | ICD-10-CM | POA: Insufficient documentation

## 2021-10-29 DIAGNOSIS — Z8701 Personal history of pneumonia (recurrent): Secondary | ICD-10-CM | POA: Diagnosis not present

## 2021-10-29 DIAGNOSIS — R2243 Localized swelling, mass and lump, lower limb, bilateral: Secondary | ICD-10-CM | POA: Insufficient documentation

## 2021-10-29 DIAGNOSIS — J9610 Chronic respiratory failure, unspecified whether with hypoxia or hypercapnia: Secondary | ICD-10-CM | POA: Diagnosis not present

## 2021-10-29 DIAGNOSIS — C9110 Chronic lymphocytic leukemia of B-cell type not having achieved remission: Secondary | ICD-10-CM | POA: Diagnosis not present

## 2021-10-29 DIAGNOSIS — L989 Disorder of the skin and subcutaneous tissue, unspecified: Secondary | ICD-10-CM | POA: Insufficient documentation

## 2021-10-29 DIAGNOSIS — R5383 Other fatigue: Secondary | ICD-10-CM | POA: Insufficient documentation

## 2021-10-29 DIAGNOSIS — Z87891 Personal history of nicotine dependence: Secondary | ICD-10-CM | POA: Insufficient documentation

## 2021-10-29 LAB — CBC WITH DIFFERENTIAL/PLATELET
Abs Immature Granulocytes: 0.22 10*3/uL — ABNORMAL HIGH (ref 0.00–0.07)
Basophils Absolute: 0.4 10*3/uL — ABNORMAL HIGH (ref 0.0–0.1)
Basophils Relative: 0 %
Eosinophils Absolute: 0.3 10*3/uL (ref 0.0–0.5)
Eosinophils Relative: 0 %
HCT: 37.8 % (ref 36.0–46.0)
Hemoglobin: 12.1 g/dL (ref 12.0–15.0)
Immature Granulocytes: 0 %
Lymphocytes Relative: 94 %
Lymphs Abs: 142.3 10*3/uL — ABNORMAL HIGH (ref 0.7–4.0)
MCH: 32.3 pg (ref 26.0–34.0)
MCHC: 32 g/dL (ref 30.0–36.0)
MCV: 100.8 fL — ABNORMAL HIGH (ref 80.0–100.0)
Monocytes Absolute: 4.6 10*3/uL — ABNORMAL HIGH (ref 0.1–1.0)
Monocytes Relative: 3 %
Neutro Abs: 4 10*3/uL (ref 1.7–7.7)
Neutrophils Relative %: 3 %
Platelets: 124 10*3/uL — ABNORMAL LOW (ref 150–400)
RBC: 3.75 MIL/uL — ABNORMAL LOW (ref 3.87–5.11)
RDW: 13.3 % (ref 11.5–15.5)
Smear Review: NORMAL
WBC: 151.8 10*3/uL (ref 4.0–10.5)
nRBC: 0 % (ref 0.0–0.2)

## 2021-10-29 LAB — COMPREHENSIVE METABOLIC PANEL
ALT: 10 U/L (ref 0–44)
AST: 22 U/L (ref 15–41)
Albumin: 4.2 g/dL (ref 3.5–5.0)
Alkaline Phosphatase: 114 U/L (ref 38–126)
Anion gap: 7 (ref 5–15)
BUN: 14 mg/dL (ref 8–23)
CO2: 26 mmol/L (ref 22–32)
Calcium: 9.2 mg/dL (ref 8.9–10.3)
Chloride: 108 mmol/L (ref 98–111)
Creatinine, Ser: 0.81 mg/dL (ref 0.44–1.00)
GFR, Estimated: >60 mL/min (ref >60)
Glucose, Bld: 103 mg/dL — ABNORMAL HIGH (ref 70–99)
Potassium: 4.4 mmol/L (ref 3.5–5.1)
Sodium: 141 mmol/L (ref 135–145)
Total Bilirubin: 0.7 mg/dL (ref 0.3–1.2)
Total Protein: 6.8 g/dL (ref 6.5–8.1)

## 2021-10-29 LAB — LACTATE DEHYDROGENASE: LDH: 153 U/L (ref 98–192)

## 2021-10-29 NOTE — Progress Notes (Signed)
[name]  Patient Care Team: Venita Lick, NP as PCP - General (Nurse Practitioner) Katherine Maple, MD as PCP - Family Medicine (Family Medicine) Oneta Rack, MD (Dermatology) Manya Silvas, MD (Inactive) (Gastroenterology) Bary Castilla, Forest Gleason, MD (General Surgery) Cammie Sickle, MD as Consulting Physician (Oncology) Cammie Sickle, MD as Consulting Physician (Internal Medicine)  CHIEF COMPLAINTS/PURPOSE OF CONSULTATION:  CLL  #  Oncology History Overview Note  Katherine [name] is a 81 y.o. female with chronic lymphocytic leukemia (CLL).  WBC has ranged between 22,000 - 101,7000 since 05/2011.   She has received Rituxan x 2 four week cycles (06/27/2015 and 05/27/2017).  Hepatitis B surface antigen and hepatitis B surface antibody were negative on 07/04/2015.   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  CCND1, ATM, chromosome 12, and TP53 were normal.     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  There was a CD5 and CD23 positive monoclonal B cell population with lambda light chain restriction, negative for FMC7 and CD38, representing  88% of leukocytes, >5,000/uL. There was no loss of, or aberrant expression of, the pan T cell  antigens to  suggest a neoplastic T cell process. CD4:CD8 ratio 2.0  No circulating blasts were detected. There was no immunophenotypic evidence of abnormal myeloid maturation.  IGH/BCL2 by FISH is pending. --------------------------------------------------------  She began ibrutinib on 04/24/2019 (discontinued on 05/30/2019).                     She developed blood-streaked sputum (slight) then significant hematuria.                     She declined further ibrutinib. ---------------------------------------------------------------------------------             She began acalabrutinib on 03/14/2020 (held on 05/13/2020 secondary to diarrhea). --------------------------------------------------------------------------------------   She has a 35 pack year smoking history.  She is in the low dose chest CT program.  Low dose chest CT on 02/02/2018 revealed a new endobronchial lesion in the segmental bronchus to the medial segment of the right middle lobe. This may simply reflect an area of retained secretions, however, the possibility of an endobronchial neoplasm should be considered. Lung-RADS 4AS, suspicious.  Low dose chest CT on 07/05/2018 revealed Lung-RADS 2, benign appearance or behavior.  Prior right middle lobe endobronchial lesion/mucous plugging was no longer visualized.  New mucous plugging in the right lower lobe.  #Secondary immune deficiency [secondary to CLL; [FEB 2023IgG- 184]-]-multiple infections pneumonia [Feb 2023-[facility]]-IVIG infusions 400 mg/kg q   CLL (chronic lymphocytic leukemia) ([place])  05/28/2015 Initial Diagnosis   CLL (chronic lymphocytic leukemia) ([facility])    HISTORY OF PRESENTING ILLNESS: Frail-appearing Caucasian female patient.  Accompanied by her daughter.  Katherine [name] 81 y.o.  female CLL-13 q. Deletion currently on surveillance because of intolerance/side effects to multiple TKIs is here for follow-up/s/p IVIG infusions is here for follow-up.  Patient energy levels wax and wane.  However she is able to still able to continue ADLs; drives locally.   In the interim also evaluated by pulmonary-notes her breathing is better.   Chronic mild swelling in the legs.  No blood in stools or black or stools.  Denies any new lumps or bumps.  Review of Systems  Constitutional:  Positive for malaise/fatigue. Negative for chills, diaphoresis, fever  and weight loss.  HENT:  Negative for nosebleeds and sore throat.   Eyes:  Negative for double vision.  Respiratory:  Positive for hemoptysis and shortness of breath. Negative for cough,  sputum production and wheezing.   Cardiovascular:  Negative for chest pain, palpitations, orthopnea and leg swelling.  Gastrointestinal:  Negative for abdominal pain, blood in stool, constipation, diarrhea, heartburn, melena, nausea and vomiting.  Genitourinary:  Negative for dysuria, frequency and urgency.  Musculoskeletal:  Positive for back pain. Negative for joint pain.  Skin: Negative.  Negative for itching and rash.  Neurological:  Negative for dizziness, tingling, focal weakness, weakness and headaches.  Endo/Heme/Allergies:  Does not bruise/bleed easily.  Psychiatric/Behavioral:  Negative for depression. The patient is not nervous/anxious and does not have insomnia.      MEDICAL HISTORY:  Past Medical History:  Diagnosis Date   Allergy    Anxiety    Depression    GERD (gastroesophageal reflux disease)    Hemorrhoids    Hypertension    Leukemia, lymphoid ([place])    CLL   Lobar pneumonia (HCC)    Osteopenia    Personal history of chemotherapy     SURGICAL HISTORY: Past Surgical History:  Procedure Laterality Date   ABDOMINAL HYSTERECTOMY     APPENDECTOMY     BREAST BIOPSY Left    bx x 3-neg   COLON SURGERY     sigmoid resection   COLONOSCOPY     2007, 2012   COLONOSCOPY WITH PROPOFOL N/A 09/17/2015   Procedure: COLONOSCOPY WITH PROPOFOL;  Surgeon: Robert Bellow, MD;  Location: [facility] ENDOSCOPY;  Service: Endoscopy;  Laterality: N/A;   OOPHORECTOMY     SPINE SURGERY     L4-5    SOCIAL HISTORY: Social History   Socioeconomic History   Marital status: Married    Spouse name: Not on file   Number of children: Not on file   Years of education: 12   Highest education level: 12th grade  Occupational History   Not on file  Tobacco Use   Smoking status: Former    Packs/day: 1.00    Years: 35.00    Total pack years: 35.00    Types: Cigarettes    Quit date: 02/22/2021    Years since quitting: 0.6   Smokeless tobacco: Never  Vaping Use   Vaping Use: Never used   Substance and Sexual Activity   Alcohol use: No    Alcohol/week: 0.0 standard drinks of alcohol   Drug use: No   Sexual activity: Not on file  Other Topics Concern   Not on file  Social History Narrative   Not on file   Social Determinants of Health   Financial Resource Strain: Low Risk  (01/24/2017)   Overall Financial Resource Strain (CARDIA)    Difficulty of Paying Living Expenses: Not hard at all  Food Insecurity: No Food Insecurity (01/24/2017)   Hunger Vital Sign    Worried About Running Out of Food in the Last Year: Never true    Ran Out of Food in the Last Year: Never true  Transportation Needs: No Transportation Needs (01/24/2017)   PRAPARE - Hydrologist (Medical): No    Lack of Transportation (Non-Medical): No  Physical Activity: Inactive (01/24/2017)   Exercise Vital Sign    Days of Exercise per Week: 0 days    Minutes of Exercise per Session: 0 min  Stress: Stress Concern Present (02/16/2018)   [employer] -  Occupational Stress Questionnaire    Feeling of Stress : To some extent  Social Connections: Moderately Integrated (01/24/2017)   Social Connection and Isolation Panel [NHANES]    Frequency of Communication with Friends and Family: More than three times a week    Frequency of Social Gatherings with Friends and Family: Once a week    Attends Religious Services: More than 4 times per year    Active Member of [employer] or Organizations: No    Attends Archivist Meetings: Never    Marital Status: Married  Human resources officer Violence: Not At Risk (01/24/2017)   Humiliation, Afraid, Rape, and Kick questionnaire    Fear of Current or Ex-Partner: No    Emotionally Abused: No    Physically Abused: No    Sexually Abused: No    FAMILY HISTORY: Family History  Problem Relation Age of Onset   Osteoporosis Mother    Hypertension Father    Heart attack Father    Stroke Maternal Grandfather    Breast cancer  Sister 51    ALLERGIES:  is allergic to augmentin [amoxicillin-pot clavulanate] and biaxin [clarithromycin].  MEDICATIONS:  Current Outpatient Medications  Medication Sig Dispense Refill   acetaminophen (TYLENOL) 325 MG tablet Take by mouth.     albuterol (VENTOLIN HFA) 108 (90 Base) MCG/ACT inhaler Inhale 2 puffs into the lungs every 6 (six) hours as needed for wheezing or shortness of breath. 8 g 2   atorvastatin (LIPITOR) 10 MG tablet Take 1 tablet (10 mg total) by mouth daily. 90 tablet 4   benazepril (LOTENSIN) 40 MG tablet Take 1 tablet (40 mg total) by mouth daily. 90 tablet 4   Budeson-Glycopyrrol-Formoterol (BREZTRI AEROSPHERE) 160-9-4.8 MCG/ACT AERO Inhale 2 puffs into the lungs in the morning and at bedtime. 10.7 g 11   Budeson-Glycopyrrol-Formoterol (BREZTRI AEROSPHERE) 160-9-4.8 MCG/ACT AERO Inhale 2 puffs into the lungs in the morning and at bedtime. 5.9 g 0   LORazepam (ATIVAN) 1 MG tablet Take 0.5 tablets (0.5 mg total) by mouth 2 (two) times daily as needed for anxiety. 60 tablet 3   mometasone (NASONEX) 50 MCG/ACT nasal spray USE 1 SPRAY TO EACH NOSTRIL TWICE A DAY 51 each 1   Multiple Vitamins-Minerals (MULTIVITAMIN WITH MINERALS) tablet Take 1 tablet by mouth daily.     PARoxetine (PAXIL) 30 MG tablet Take 1 tablet (30 mg total) by mouth daily. 90 tablet 4   Spacer/Aero-Holding Chambers (OPTICHAMBER DIAMOND) MISC 1 EACH BY OTHER ROUTE ONCE FOR 1 DOSE.     vitamin C (ASCORBIC ACID) 250 MG tablet Take 250 mg by mouth daily.     vitamin E 1000 UNIT capsule Take by mouth.     No current facility-administered medications for this visit.      Marland Kitchen  PHYSICAL EXAMINATION: ECOG PERFORMANCE STATUS: 1 - Symptomatic but completely ambulatory  Vitals:   10/29/21 1048  BP: 130/62  Pulse: 79  Temp: (!) 97.1 F (36.2 C)  SpO2: 95%   Filed Weights   10/29/21 1048  Weight: 149 lb 6.4 oz (67.8 kg)    Physical Exam HENT:     Head: Normocephalic and atraumatic.      Mouth/Throat:     Pharynx: No oropharyngeal exudate.  Eyes:     Pupils: Pupils are equal, round, and reactive to light.  Cardiovascular:     Rate and Rhythm: Normal rate and regular rhythm.  Pulmonary:     Effort: No respiratory distress.     Breath sounds: No wheezing.  Comments: Decreased air entry bilaterally at the bases. Abdominal:     General: Bowel sounds are normal. There is no distension.     Palpations: Abdomen is soft. There is no mass.     Tenderness: There is no abdominal tenderness. There is no guarding or rebound.  Musculoskeletal:        General: No tenderness. Normal range of motion.     Cervical back: Normal range of motion and neck supple.  Skin:    General: Skin is warm.  Neurological:     Mental Status: She is alert and oriented to person, place, and time.  Psychiatric:        Mood and Affect: Affect normal.      LABORATORY DATA:  I have reviewed the data as listed Lab Results  Component Value Date   WBC 151.8 (HH) 10/29/2021   HGB 12.1 10/29/2021   HCT 37.8 10/29/2021   MCV 100.8 (H) 10/29/2021   PLT 124 (L) 10/29/2021   Recent Labs    07/31/21 0804 08/26/21 1045 10/29/21 1037  NA 141 140 141  K 4.0 4.0 4.4  CL 104 106 108  CO2 _0 GLUCOSE 83 88 103*  BUN _1 CREATININE 0.81 0.93 0.81  CALCIUM 9.2 8.9 9.2  GFRNONAA >60 >60 >60  PROT 7.2 7.0 6.8  ALBUMIN 4.3 4.1 4.2  AST _2 ALT _3 ALKPHOS 98 94 114  BILITOT 1.0 1.3* 0.7    RADIOGRAPHIC STUDIES: I have personally reviewed the radiological images as listed and agreed with the findings in the report. No results found.  ASSESSMENT & PLAN:   CLL (chronic lymphocytic leukemia) ([place]) #CLL-Rai stage II; FISH 13 q. Deletion-poor tolerance to Ibrutinib-hematuria; ; and Acalburtinib [stopped in March 2022 because of diarrhea].    #From a clinical standpoint patient's CLL is PROGRESSING [although patient's symptoms-of fatigue are quite subjective];; ALSO  patient's white count is rising-White count is 151.  Currently-hemoglobin 12.5 platelets slightly low at 90-120s.   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   JUNE immunoglobulins- IgG- 700+. Await Immunoglobulins from today.  Hold off IVIG infusions for now.  #COPD/history of pneumonia -bilateral Jan 2023 [QUITsmoking-April, 2023; [facility]]-right middle lobe lung atelectasis- status post evaluation with Dr.Gonzalez; May 2023-continued  Persistent complete collapse right middle lobe with opacification of the right middle lobe bronchus; with some improvement in the lower lobe.  Continue compliance with inhalers and follow-up with pulmonary.  STABLE. Awaiting CT scan- [declined by insurance]  # Skin lesions- ? SCC Left UE-s/p  Evaluation Dr.Graham re: skin cancer/ Left UE.   #Ongoing fatigue multifactorial-underlying chronic respiratory failure; underlying CLL; possible depression/anxiety.  Reasonable to hold any treatment at this time.  If continued get worse would recommend treatment for CLL.    Imm in 4 w # DISPOSITION:  # Follow up in 2 months [wed/thursday] ;MD; labs- CBC/CMP;LDH;quantitative immunoglobulins -Dr.B     All questions were answered. The patient knows to call the clinic with any problems, questions or concerns.   Cammie Sickle, MD 10/29/2021 2:33 PM

## 2021-10-29 NOTE — Progress Notes (Signed)
No concerns. 

## 2021-10-29 NOTE — Assessment & Plan Note (Addendum)
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 Deletion-poor tolerance to Ibrutinib-hematuria; ; and Acalburtinib [stopped in March 2022 because of diarrhea].    #From a clinical standpoint patient's CLL is PROGRESSING [although patient's symptoms-of fatigue are quite subjective];; ALSO patient's white count is rising-White count is 151.  Currently-hemoglobin 12.5 platelets slightly low at 90-120s.   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   JUNE immunoglobulins- IgG- 700+. Await Immunoglobulins from today.  Hold off IVIG infusions for now.  #COPD/history of pneumonia -bilateral Jan 2023 [QUITsmoking-April, 2023; [facility]]-right middle lobe lung atelectasis- status post evaluation with Dr.Gonzalez; May 2023-continued  Persistent complete collapse right middle lobe with opacification of the right middle lobe bronchus; with some improvement in the lower lobe.  Continue compliance with inhalers and follow-up with pulmonary.  STABLE. Awaiting CT scan- [declined by insurance]  # Skin lesions- ? SCC Left UE-s/p  Evaluation Dr.Graham re: skin cancer/ Left UE.   #Ongoing fatigue multifactorial-underlying chronic respiratory failure; underlying CLL; possible depression/anxiety.  Reasonable to hold any treatment at this time.  If continued get worse would recommend treatment for CLL.   Imm in 4 w # DISPOSITION:  # Follow up in 2 months [wed/thursday] ;MD; labs- CBC/CMP;LDH;quantitative immunoglobulins -Dr.B

## 2021-10-30 ENCOUNTER — Telehealth: Payer: Self-pay

## 2021-10-30 NOTE — Patient Outreach (Signed)
  Care Coordination   10/30/2021 Name: Cyntha Brickman MRN: [id number] DOB: 1940-12-20   Care Coordination Outreach Attempts:  An unsuccessful telephone outreach was attempted today to offer the patient information about available care coordination services as a benefit of their health plan.   Follow Up Plan:  Additional outreach attempts will be made to offer the patient care coordination information and services.   Encounter Outcome:  No Answer  Care Coordination Interventions Activated:  No   Care Coordination Interventions:  No, not indicated    Noreene Larsson RN, MSN, CCM Community Care Coordinator [facility] Mobile: [phone]

## 2021-11-01 LAB — IMMUNOGLOBULINS A/E/G/M, SERUM
IgA: 13 mg/dL — ABNORMAL LOW (ref 64–422)
IgE (Immunoglobulin E), Serum: <2 IU/mL — ABNORMAL LOW (ref 6–495)
IgG (Immunoglobin G), Serum: 458 mg/dL — ABNORMAL LOW (ref 586–1602)
IgM (Immunoglobulin M), Srm: <5 mg/dL — ABNORMAL LOW (ref 26–217)

## 2021-11-09 DIAGNOSIS — H40003 Preglaucoma, unspecified, bilateral: Secondary | ICD-10-CM | POA: Diagnosis not present

## 2021-11-12 DIAGNOSIS — J449 Chronic obstructive pulmonary disease, unspecified: Secondary | ICD-10-CM | POA: Diagnosis not present

## 2021-11-12 DIAGNOSIS — J479 Bronchiectasis, uncomplicated: Secondary | ICD-10-CM | POA: Diagnosis not present

## 2021-11-14 ENCOUNTER — Other Ambulatory Visit: Payer: Self-pay | Admitting: Nurse Practitioner

## 2021-11-16 NOTE — Telephone Encounter (Signed)
Unable to refill per protocol, Rx request is too soon Last RF 11/12/20 for 90 and 4 RF.Should have enough until November.E-Prescribing Status: Receipt confirmed by pharmacy (11/12/2020). Will refuse.  Requested Prescriptions  Pending Prescriptions Disp Refills  . PARoxetine (PAXIL) 30 MG tablet [Pharmacy Med Name: PAROXETINE HCL 30 MG TABLET] 90 tablet 4    Sig: TAKE 1 TABLET BY MOUTH EVERY DAY     Psychiatry:  Antidepressants - SSRI Passed - 11/14/2021  9:25 AM      Passed - Completed PHQ-2 or PHQ-9 in the last 360 days      Passed - Valid encounter within last 6 months    Recent Outpatient Visits          3 months ago CLL (chronic lymphocytic leukemia) ([place])   [place] Cannady, Jolene T, NP   6 months ago Centrilobular emphysema ([place])   [place], Henrine Screws T, NP   8 months ago Community acquired pneumonia of right middle lobe of lung   [facility] Cannady, Jolene T, NP   1 year ago CLL (chronic lymphocytic leukemia) ([place])   [place] Cannady, Jolene T, NP   1 year ago CLL (chronic lymphocytic leukemia) ([place])   [place], Barbaraann Faster, NP      Future Appointments            Tomorrow Venita Lick, NP [place], [facility]

## 2021-11-17 ENCOUNTER — Ambulatory Visit: Payer: Medicare HMO | Admitting: Nurse Practitioner

## 2021-11-17 DIAGNOSIS — I700 Atherosclerosis of aorta: Secondary | ICD-10-CM

## 2021-11-17 DIAGNOSIS — C9110 Chronic lymphocytic leukemia of B-cell type not having achieved remission: Secondary | ICD-10-CM

## 2021-11-17 DIAGNOSIS — D692 Other nonthrombocytopenic purpura: Secondary | ICD-10-CM

## 2021-11-17 DIAGNOSIS — F419 Anxiety disorder, unspecified: Secondary | ICD-10-CM

## 2021-11-17 DIAGNOSIS — F339 Major depressive disorder, recurrent, unspecified: Secondary | ICD-10-CM

## 2021-11-17 DIAGNOSIS — J432 Centrilobular emphysema: Secondary | ICD-10-CM

## 2021-11-17 DIAGNOSIS — D696 Thrombocytopenia, unspecified: Secondary | ICD-10-CM

## 2021-11-17 DIAGNOSIS — I10 Essential (primary) hypertension: Secondary | ICD-10-CM

## 2021-11-21 ENCOUNTER — Other Ambulatory Visit: Payer: Self-pay | Admitting: Nurse Practitioner

## 2021-11-21 DIAGNOSIS — I10 Essential (primary) hypertension: Secondary | ICD-10-CM

## 2021-11-23 NOTE — Telephone Encounter (Signed)
Requested Prescriptions  Pending Prescriptions Disp Refills  . benazepril (LOTENSIN) 40 MG tablet [Pharmacy Med Name: BENAZEPRIL HCL 40 MG TABLET] 90 tablet 0    Sig: TAKE 1 TABLET BY MOUTH EVERY DAY     Cardiovascular:  ACE Inhibitors Passed - 11/21/2021  9:28 AM      Passed - Cr in normal range and within 180 days    Creatinine  Date Value Ref Range Status  05/13/2021 32.1 20.0 - 300.0 mg/dL Final  08/10/2013 0.95 0.60 - 1.30 mg/dL Final   Creatinine, Ser  Date Value Ref Range Status  10/29/2021 0.81 0.44 - 1.00 mg/dL Final         Passed - K in normal range and within 180 days    Potassium  Date Value Ref Range Status  10/29/2021 4.4 3.5 - 5.1 mmol/L Final  08/10/2013 4.1 3.5 - 5.1 mmol/L Final         Passed - Patient is not pregnant      Passed - Last BP in normal range    BP Readings from Last 1 Encounters:  10/29/21 130/62         Passed - Valid encounter within last 6 months    Recent Outpatient Visits          3 months ago CLL (chronic lymphocytic leukemia) ([place])   [place] Cannady, Jolene T, NP   6 months ago Centrilobular emphysema ([place])   [place] Cannady, Barbaraann Faster, NP   8 months ago Community acquired pneumonia of right middle lobe of lung   [facility] Cannady, Jolene T, NP   1 year ago CLL (chronic lymphocytic leukemia) ([place])   [place] Cannady, Jolene T, NP   1 year ago CLL (chronic lymphocytic leukemia) ([place])   [place], Barbaraann Faster, NP      Future Appointments            In 2 weeks Cannady, Barbaraann Faster, NP [facility], [facility]

## 2021-12-04 NOTE — Telephone Encounter (Signed)
Note:  10/20/21 We received letter from insurance that they have denied this CT. Message sent to Dr. Patsey Berthold making her aware

## 2021-12-06 NOTE — Patient Instructions (Signed)

## 2021-12-09 ENCOUNTER — Encounter: Payer: Self-pay | Admitting: Nurse Practitioner

## 2021-12-09 ENCOUNTER — Ambulatory Visit (INDEPENDENT_AMBULATORY_CARE_PROVIDER_SITE_OTHER): Payer: Medicare HMO | Admitting: Nurse Practitioner

## 2021-12-09 VITALS — BP 119/71 | HR 71 | Temp 98.7°F | Ht 63.0 in | Wt 149.5 lb

## 2021-12-09 DIAGNOSIS — F17210 Nicotine dependence, cigarettes, uncomplicated: Secondary | ICD-10-CM | POA: Diagnosis not present

## 2021-12-09 DIAGNOSIS — F339 Major depressive disorder, recurrent, unspecified: Secondary | ICD-10-CM

## 2021-12-09 DIAGNOSIS — I10 Essential (primary) hypertension: Secondary | ICD-10-CM | POA: Diagnosis not present

## 2021-12-09 DIAGNOSIS — J432 Centrilobular emphysema: Secondary | ICD-10-CM

## 2021-12-09 DIAGNOSIS — Z23 Encounter for immunization: Secondary | ICD-10-CM | POA: Diagnosis not present

## 2021-12-09 DIAGNOSIS — R69 Illness, unspecified: Secondary | ICD-10-CM | POA: Diagnosis not present

## 2021-12-09 DIAGNOSIS — Z79899 Other long term (current) drug therapy: Secondary | ICD-10-CM

## 2021-12-09 DIAGNOSIS — D696 Thrombocytopenia, unspecified: Secondary | ICD-10-CM

## 2021-12-09 DIAGNOSIS — C9110 Chronic lymphocytic leukemia of B-cell type not having achieved remission: Secondary | ICD-10-CM | POA: Diagnosis not present

## 2021-12-09 DIAGNOSIS — D692 Other nonthrombocytopenic purpura: Secondary | ICD-10-CM

## 2021-12-09 DIAGNOSIS — F419 Anxiety disorder, unspecified: Secondary | ICD-10-CM

## 2021-12-09 DIAGNOSIS — E7800 Pure hypercholesterolemia, unspecified: Secondary | ICD-10-CM | POA: Diagnosis not present

## 2021-12-09 MED ORDER — HYDROCORTISONE ACETATE 25 MG RE SUPP: 25.0000 mg | Freq: Two times a day (BID) | RECTAL | 12 refills | Status: DC

## 2021-12-09 MED ORDER — LORAZEPAM 1 MG PO TABS: 0.5000 mg | ORAL_TABLET | Freq: Two times a day (BID) | ORAL | 4 refills | Status: DC | PRN

## 2021-12-09 NOTE — Assessment & Plan Note (Signed)
Long term, patient aware of risks with long term use.  UDS up to date (05/13/21) and  controlled substance contract obtained today.

## 2021-12-09 NOTE — Assessment & Plan Note (Signed)
Chronic, stable with no SI/HI.  Continue Paxil at this time and consider transition in future to Sertraline due to age >52 and anticholinergic effects with Paxil.  Continue Ativan as needed, patient aware of risks with long term use.  Refills sent in.  UDS obtained today (05/13/21) and controlled substance contract today.

## 2021-12-09 NOTE — Progress Notes (Signed)
BP 119/71   Pulse 71   Temp 98.7 F (37.1 C) (Oral)   Ht '5\' 3"'$  (1.6 m)   Wt 149 lb 8 oz (67.8 kg)   SpO2 93%   BMI 26.48 kg/m    Subjective:    Patient ID: Katherine [name], female    DOB: Jul 26, 1940, 81 y.o.   MRN: [id number]  HPI: Katherine [name] is a 81 y.o. female  Chief Complaint  Patient presents with   Mood   COPD   CLL   Hyperlipidemia   Hypertension   Medication Management    Patient has some questions in regards to becoming a Good-Rx member to get help with her current prescriptions. Patient says her BREZTRI inhaler is costing her $47 a month. Patient is requesting Hemorrhoids Suppositories at today's visit.    Requests refill on hemorrhoid Anusol today -- for occasional hemorrhoids.  HYPERLIPIDEMIA Continues on Atorvastatin 10 MG.   Hyperlipidemia status: good compliance Satisfied with current treatment?  yes Side effects:  no Medication compliance: good compliance Supplements: none Aspirin:  no The ASCVD Risk score (Arnett DK, et al., 2019) failed to calculate for the following reasons:   The 2019 ASCVD risk score is only valid for ages 45 to 19 Chest pain:  no Coronary artery disease:  no Family history CAD:  yes Family history early CAD:  no   COPD Followed by pulmonary, last 10/14/21.  Has underlying emphysema and aortic atherosclerosis noted on past imaging.  Was started on Breztri by pulmonary, currently paying $47 a month for this.  She does find this is beneficial for her. Is using vest as well, which has helped a lot she reports -- uses twice a day.  Not a current smoker.  Has underlying CLL and is followed closely by oncology with last visit 10/29/21 -- has completed infusions due to allergic reaction. COPD status: stable Satisfied with current treatment?: yes Oxygen use: no Dyspnea frequency: no Cough frequency: no Rescue inhaler frequency: occasional Limitation of activity: no Productive cough: none Last Spirometry: with  pulmonary Pneumovax: Up to Date Influenza: Up to Date  ANXIETY/STRESS Continues on Paxil 30 MG daily + Ativan 0.5 MG BID PRN -- often takes twice a day.  Pt is aware of risks of benzo medication use to include increased sedation, respiratory suppression, falls, dependence and cardiovascular events.  Pt would like to continue treatment as benefit determined to outweigh risk.  PDMP review last filled 11/07/21.  She endorses some anxious mood on occasion, getting aggravated at times not being able to do what she used to do.  UDS last on 05/13/21. Duration:controlled Anxious mood: yes Excessive worrying: occasional Irritability: no  Sweating: no Nausea: no Palpitations:no Hyperventilation: no Panic attacks: no Agoraphobia: no  Obscessions/compulsions: no Depressed mood: no    12/09/2021    1:11 PM 08/17/2021    9:46 AM 05/13/2021    9:47 AM 03/20/2021    2:37 PM 11/12/2020   11:15 AM  Depression screen PHQ 2/9  Decreased Interest 0 0 0 0 0  Down, Depressed, Hopeless 0 0 0 0 0  PHQ - 2 Score 0 0 0 0 0  Altered sleeping 0 0 0 0 0  Tired, decreased energy 0 1 0 0 1  Change in appetite 0 0 0 0 0  Feeling bad or failure about yourself  0 0 0 0 0  Trouble concentrating 0 0 0 0 0  Moving slowly or fidgety/restless 0 0 0 0 0  Suicidal thoughts 0 0 0 0 0  PHQ-9 Score 0 1 0 0 1  Difficult doing work/chores Not difficult at all Not difficult at all   Not difficult at all  Anhedonia: no Weight changes: no Insomnia: no Hypersomnia: no Fatigue/loss of energy: no Feelings of worthlessness: no Feelings of guilt: no Impaired concentration/indecisiveness: no Suicidal ideations: no  Crying spells: no Recent Stressors/Life Changes: no   Relationship problems: no   Family stress: no     Financial stress: no    Job stress: no    Recent death/loss: no    2021/12/30    1:10 PM 05/13/2021    9:48 AM 03/20/2021    2:38 PM 11/12/2020   11:24 AM  GAD 7 : Generalized Anxiety Score  Nervous,  Anxious, on Edge 1 0 0 0  Control/stop worrying 1 0 0 0  Worry too much - different things 1 0 0 0  Trouble relaxing 1 0 0 0  Restless  0 0 0  Easily annoyed or irritable 1 0 0 0  Afraid - awful might happen  0 0 0  Total GAD 7 Score  0 0 0  Anxiety Difficulty Not difficult at all Not difficult at all Not difficult at all Not difficult at all   Relevant past medical, surgical, family and social history reviewed and updated as indicated. Interim medical history since our last visit reviewed. Allergies and medications reviewed and updated.  Review of Systems  Constitutional:  Negative for activity change, appetite change, diaphoresis, fatigue and fever.  HENT:  Negative for ear discharge and ear pain.   Respiratory:  Negative for cough, chest tightness and shortness of breath.   Cardiovascular:  Negative for chest pain, palpitations and leg swelling.  Gastrointestinal: Negative.   Neurological: Negative.   Psychiatric/Behavioral: Negative.      Per HPI unless specifically indicated above     Objective:    BP 119/71   Pulse 71   Temp 98.7 F (37.1 C) (Oral)   Ht '5\' 3"'$  (1.6 m)   Wt 149 lb 8 oz (67.8 kg)   SpO2 93%   BMI 26.48 kg/m   Wt Readings from Last 3 Encounters:  12/30/2021 149 lb 8 oz (67.8 kg)  10/29/21 149 lb 6.4 oz (67.8 kg)  10/14/21 147 lb 12.8 oz (67 kg)    Physical Exam Vitals and nursing note reviewed.  Constitutional:      General: She is awake. She is not in acute distress.    Appearance: She is well-developed and well-groomed. She is not ill-appearing or toxic-appearing.  HENT:     Head: Normocephalic.     Right Ear: Hearing normal.     Left Ear: Hearing normal.  Eyes:     General: Lids are normal.        Right eye: No discharge.        Left eye: No discharge.     Conjunctiva/sclera: Conjunctivae normal.     Pupils: Pupils are equal, round, and reactive to light.  Neck:     Thyroid: No thyromegaly.     Vascular: No carotid bruit.   Cardiovascular:     Rate and Rhythm: Normal rate and regular rhythm.     Heart sounds: Normal heart sounds. No murmur heard.    No gallop.  Pulmonary:     Effort: Pulmonary effort is normal. No accessory muscle usage or respiratory distress.     Breath sounds: Normal breath sounds.  Abdominal:  General: Bowel sounds are normal.     Palpations: Abdomen is soft.  Musculoskeletal:     Cervical back: Normal range of motion and neck supple.     Right lower leg: 1+ Edema present.     Left lower leg: 1+ Edema present.  Lymphadenopathy:     Cervical: No cervical adenopathy.  Skin:    General: Skin is warm and dry.     Comments: Scattered pale bruising to upper extremities.  Neurological:     Mental Status: She is alert and oriented to person, place, and time.     Deep Tendon Reflexes: Reflexes are normal and symmetric.     Reflex Scores:      Brachioradialis reflexes are 2+ on the right side and 2+ on the left side.      Patellar reflexes are 2+ on the right side and 2+ on the left side. Psychiatric:        Attention and Perception: Attention normal.        Mood and Affect: Mood normal.        Speech: Speech normal.        Behavior: Behavior normal. Behavior is cooperative.        Thought Content: Thought content normal.    Results for orders placed or performed in visit on 10/29/21  Immunoglobulins, QN, A/E/G/M  Result Value Ref Range   IgG (Immunoglobin G), Serum 458 (L) 586 - 1,602 mg/dL   IgA 13 (L) 64 - 422 mg/dL   IgM (Immunoglobulin M), Srm <5 (L) 26 - 217 mg/dL   IgE (Immunoglobulin E), Serum <2 (L) 6 - 495 IU/mL  Lactate dehydrogenase  Result Value Ref Range   LDH 153 98 - 192 U/L  Comprehensive metabolic panel  Result Value Ref Range   Sodium 141 135 - 145 mmol/L   Potassium 4.4 3.5 - 5.1 mmol/L   Chloride 108 98 - 111 mmol/L   CO2 26 22 - 32 mmol/L   Glucose, Bld 103 (H) 70 - 99 mg/dL   BUN 14 8 - 23 mg/dL   Creatinine, Ser 0.81 0.44 - 1.00 mg/dL   Calcium  9.2 8.9 - 10.3 mg/dL   Total Protein 6.8 6.5 - 8.1 g/dL   Albumin 4.2 3.5 - 5.0 g/dL   AST 22 15 - 41 U/L   ALT 10 0 - 44 U/L   Alkaline Phosphatase 114 38 - 126 U/L   Total Bilirubin 0.7 0.3 - 1.2 mg/dL   GFR, Estimated >60 >60 mL/min   Anion gap 7 5 - 15  CBC with Differential/Platelet  Result Value Ref Range   WBC 151.8 (HH) 4.0 - 10.5 K/uL   RBC 3.75 (L) 3.87 - 5.11 MIL/uL   Hemoglobin 12.1 12.0 - 15.0 g/dL   HCT 37.8 36.0 - 46.0 %   MCV 100.8 (H) 80.0 - 100.0 fL   MCH 32.3 26.0 - 34.0 pg   MCHC 32.0 30.0 - 36.0 g/dL   RDW 13.3 11.5 - 15.5 %   Platelets 124 (L) 150 - 400 K/uL   nRBC 0.0 0.0 - 0.2 %   Neutrophils Relative % 3 %   Neutro Abs 4.0 1.7 - 7.7 K/uL   Lymphocytes Relative 94 %   Lymphs Abs 142.3 (H) 0.7 - 4.0 K/uL   Monocytes Relative 3 %   Monocytes Absolute 4.6 (H) 0.1 - 1.0 K/uL   Eosinophils Relative 0 %   Eosinophils Absolute 0.3 0.0 - 0.5 K/uL   Basophils Relative  0 %   Basophils Absolute 0.4 (H) 0.0 - 0.1 K/uL   WBC Morphology      DIFF CONFIRMED BY MANUAL.  CONSISTANT WITH KNOWN CLL.   RBC Morphology UNREMARKABLE    Smear Review Normal platelet morphology    Immature Granulocytes 0 %   Abs Immature Granulocytes 0.22 (H) 0.00 - 0.07 K/uL      Assessment & Plan:   Problem List Items Addressed This Visit       Cardiovascular and Mediastinum   Essential hypertension    Chronic, stable.  BP at goal today.  Continue current medication regimen and adjust as needed.  Recommend she monitor BP at least a few mornings a week at home and document.  DASH diet at home.  CMP up to date with oncology, reviewed.  Could consider change to ARB in future due to her underlying COPD, has been on ACE for several years.         Senile purpura ([place])    Noted on exam, recommend gentle skin care and monitoring for wounds, if wounds present immediately alert provider.        Respiratory   Centrilobular emphysema (HCC)    Chronic, stable.  Continue current medication  regimen as ordered by pulmonary + continue use of InCourage vest.   Recommend she avoid use of Benadryl due to age >80 and BEERS criteria, utilize Claritin instead.  Is doing much better with change in pulmonary regimen.         Hematopoietic and Hemostatic   Thrombocytopenia ([place])    With underlying CLL, followed by oncology.  Continue this collaboration.        Other   Anxiety    Chronic, stable with no SI/HI.  Continue Paxil at this time and consider transition in future to Sertraline due to age >29 and anticholinergic effects with Paxil.  Continue Ativan as needed, patient aware of risks with long term use.  Refills sent in.  UDS obtained today (05/13/21) and controlled substance contract today.      Relevant Medications   LORazepam (ATIVAN) 1 MG tablet   CLL (chronic lymphocytic leukemia) (HCC) - Primary    Chronic, under treatment with oncology.  Recent note and labs reviewed.  Continue collaboration with oncology provider.  Discussed goals of care with patient.      Relevant Medications   LORazepam (ATIVAN) 1 MG tablet   Controlled substance agreement signed    Signed today 12/09/21 with patient and discussed.      Depression, recurrent (HCC)   Relevant Medications   LORazepam (ATIVAN) 1 MG tablet   Hypercholesterolemia    Chronic, ongoing.  Continue current medication regimen and adjust as needed.  Lipid panel today.       Relevant Orders   Lipid Panel w/o Chol/HDL Ratio   Long-term current use of benzodiazepine    Long term, patient aware of risks with long term use.  UDS up to date (05/13/21) and  controlled substance contract obtained today.      Nicotine dependence, cigarettes, uncomplicated    Recently quit, recommend continued cessation.      Other Visit Diagnoses     Flu vaccine need       Flu vaccine in office today.   Relevant Orders   Flu Vaccine QUAD High Dose(Fluad) (Completed)        Follow up plan: Return in about 6 months (around  06/10/2022) for CLL, ANXIETY, HLD, COPD, THROMBOCYTOPENIA.

## 2021-12-09 NOTE — Assessment & Plan Note (Signed)
Signed today 12/09/21 with patient and discussed.

## 2021-12-09 NOTE — Assessment & Plan Note (Signed)
Chronic, under treatment with oncology.  Recent note and labs reviewed.  Continue collaboration with oncology provider.  Discussed goals of care with patient.

## 2021-12-09 NOTE — Assessment & Plan Note (Signed)
Chronic, stable.  Continue current medication regimen as ordered by pulmonary + continue use of InCourage vest.   Recommend she avoid use of Benadryl due to age >68 and BEERS criteria, utilize Claritin instead.  Is doing much better with change in pulmonary regimen.

## 2021-12-09 NOTE — Assessment & Plan Note (Signed)
Noted on exam, recommend gentle skin care and monitoring for wounds, if wounds present immediately alert provider.

## 2021-12-09 NOTE — Assessment & Plan Note (Signed)
With underlying CLL, followed by oncology.  Continue this collaboration.

## 2021-12-09 NOTE — Assessment & Plan Note (Signed)
Chronic, stable.  BP at goal today.  Continue current medication regimen and adjust as needed.  Recommend she monitor BP at least a few mornings a week at home and document.  DASH diet at home.  CMP up to date with oncology, reviewed.  Could consider change to ARB in future due to her underlying COPD, has been on ACE for several years.

## 2021-12-09 NOTE — Assessment & Plan Note (Signed)
Recently quit, recommend continued cessation.

## 2021-12-09 NOTE — Assessment & Plan Note (Signed)
Chronic, ongoing.  Continue current medication regimen and adjust as needed. Lipid panel today. 

## 2021-12-10 LAB — LIPID PANEL W/O CHOL/HDL RATIO
Cholesterol, Total: 129 mg/dL (ref 100–199)
HDL: 32 mg/dL — ABNORMAL LOW (ref >39)
LDL Chol Calc (NIH): 70 mg/dL (ref 0–99)
Triglycerides: 157 mg/dL — ABNORMAL HIGH (ref 0–149)
VLDL Cholesterol Cal: 27 mg/dL (ref 5–40)

## 2021-12-10 NOTE — Progress Notes (Signed)
Contacted via [place] morning Katherine [name], your labs have returned and overall LDL remains at goal.  Triglycerides a little elevated, which could be related to not fasting prior to lab.  Continue your Atorvastatin.  Any questions? Keep being wonderful!!  Thank you for allowing me to participate in your care.  I appreciate you. Kindest regards, Jazmine Heckman

## 2021-12-12 DIAGNOSIS — J479 Bronchiectasis, uncomplicated: Secondary | ICD-10-CM | POA: Diagnosis not present

## 2021-12-12 DIAGNOSIS — J449 Chronic obstructive pulmonary disease, unspecified: Secondary | ICD-10-CM | POA: Diagnosis not present

## 2021-12-14 ENCOUNTER — Other Ambulatory Visit: Payer: Self-pay | Admitting: Nurse Practitioner

## 2021-12-14 DIAGNOSIS — I10 Essential (primary) hypertension: Secondary | ICD-10-CM

## 2021-12-15 NOTE — Telephone Encounter (Signed)
Requested Prescriptions  Pending Prescriptions Disp Refills  . benazepril (LOTENSIN) 40 MG tablet [Pharmacy Med Name: BENAZEPRIL HCL 40 MG TABLET] 90 tablet 0    Sig: TAKE 1 TABLET BY MOUTH EVERY DAY     Cardiovascular:  ACE Inhibitors Passed - 12/14/2021  6:48 PM      Passed - Cr in normal range and within 180 days    Creatinine  Date Value Ref Range Status  05/13/2021 32.1 20.0 - 300.0 mg/dL Final  08/10/2013 0.95 0.60 - 1.30 mg/dL Final   Creatinine, Ser  Date Value Ref Range Status  10/29/2021 0.81 0.44 - 1.00 mg/dL Final         Passed - K in normal range and within 180 days    Potassium  Date Value Ref Range Status  10/29/2021 4.4 3.5 - 5.1 mmol/L Final  08/10/2013 4.1 3.5 - 5.1 mmol/L Final         Passed - Patient is not pregnant      Passed - Last BP in normal range    BP Readings from Last 1 Encounters:  12/09/21 119/71         Passed - Valid encounter within last 6 months    Recent Outpatient Visits          6 days ago CLL (chronic lymphocytic leukemia) ([place])   [place] Cannady, Jolene T, NP   4 months ago CLL (chronic lymphocytic leukemia) ([place])   [place] Cannady, Jolene T, NP   7 months ago Centrilobular emphysema ([place])   [place] Cannady, Henrine Screws T, NP   9 months ago Community acquired pneumonia of right middle lobe of lung   [facility] Cannady, Jolene T, NP   1 year ago CLL (chronic lymphocytic leukemia) ([place])   [place], Barbaraann Faster, NP      Future Appointments            In 5 months Cannady, Barbaraann Faster, NP [facility], [facility]           . atorvastatin (LIPITOR) 10 MG tablet [Pharmacy Med Name: ATORVASTATIN 10 MG TABLET] 90 tablet 3    Sig: TAKE 1 TABLET BY MOUTH EVERY DAY     Cardiovascular:  Antilipid - Statins Failed - 12/14/2021  6:48 PM      Failed - Lipid Panel in normal range within the last 12 months    Cholesterol, Total  Date Value Ref Range  Status  12/09/2021 129 100 - 199 mg/dL Final   Cholesterol Piccolo, Waived  Date Value Ref Range Status  03/09/2016 119 <200 mg/dL Final    Comment:                            Desirable                <200                         Borderline High      200- 239                         High                     >239    LDL Chol Calc (NI[facility])  Date Value Ref Range Status  12/09/2021 70 0 - 99 mg/dL Final  HDL  Date Value Ref Range Status  12/09/2021 32 (L) >39 mg/dL Final   Triglycerides  Date Value Ref Range Status  12/09/2021 157 (H) 0 - 149 mg/dL Final   Triglycerides Piccolo,Waived  Date Value Ref Range Status  03/09/2016 273 (H) <150 mg/dL Final    Comment:                            Normal                   <150                         Borderline High     150 - 199                         High                200 - 499                         Very High                >499          Passed - Patient is not pregnant      Passed - Valid encounter within last 12 months    Recent Outpatient Visits          6 days ago CLL (chronic lymphocytic leukemia) ([place])   [place] Cannady, Jolene T, NP   4 months ago CLL (chronic lymphocytic leukemia) ([place])   [place] Cannady, Jolene T, NP   7 months ago Centrilobular emphysema ([place])   [place], Henrine Screws T, NP   9 months ago Community acquired pneumonia of right middle lobe of lung   [facility] Cannady, Jolene T, NP   1 year ago CLL (chronic lymphocytic leukemia) ([place])   [place], Barbaraann Faster, NP      Future Appointments            In 5 months Cannady, Barbaraann Faster, NP [facility], [facility]           . PARoxetine (PAXIL) 30 MG tablet [Pharmacy Med Name: PAROXETINE HCL 30 MG TABLET] 90 tablet 1    Sig: TAKE 1 TABLET BY MOUTH EVERY DAY     Psychiatry:  Antidepressants - SSRI Passed - 12/14/2021  6:48 PM      Passed - Completed PHQ-2  or PHQ-9 in the last 360 days      Passed - Valid encounter within last 6 months    Recent Outpatient Visits          6 days ago CLL (chronic lymphocytic leukemia) ([place])   [place] Cannady, Jolene T, NP   4 months ago CLL (chronic lymphocytic leukemia) ([place])   [place] Cannady, Jolene T, NP   7 months ago Centrilobular emphysema ([place])   [place], Barbaraann Faster, NP   9 months ago Community acquired pneumonia of right middle lobe of lung   [facility] Cannady, Jolene T, NP   1 year ago CLL (chronic lymphocytic leukemia) ([place])   [place], Barbaraann Faster, NP      Future Appointments            In 5 months [place],  Barbaraann Faster, NP [facility]

## 2021-12-30 ENCOUNTER — Inpatient Hospital Stay (HOSPITAL_BASED_OUTPATIENT_CLINIC_OR_DEPARTMENT_OTHER): Payer: Medicare HMO | Admitting: Medical Oncology

## 2021-12-30 ENCOUNTER — Telehealth: Payer: Self-pay | Admitting: Medical Oncology

## 2021-12-30 ENCOUNTER — Ambulatory Visit: Payer: Medicare HMO | Admitting: Internal Medicine

## 2021-12-30 ENCOUNTER — Other Ambulatory Visit: Payer: Medicare HMO

## 2021-12-30 ENCOUNTER — Encounter: Payer: Self-pay | Admitting: Medical Oncology

## 2021-12-30 ENCOUNTER — Inpatient Hospital Stay: Payer: Medicare HMO | Attending: Internal Medicine

## 2021-12-30 VITALS — BP 118/67 | HR 75 | Temp 97.9°F | Wt 151.0 lb

## 2021-12-30 DIAGNOSIS — C9110 Chronic lymphocytic leukemia of B-cell type not having achieved remission: Secondary | ICD-10-CM

## 2021-12-30 DIAGNOSIS — J449 Chronic obstructive pulmonary disease, unspecified: Secondary | ICD-10-CM | POA: Insufficient documentation

## 2021-12-30 DIAGNOSIS — Z87891 Personal history of nicotine dependence: Secondary | ICD-10-CM | POA: Insufficient documentation

## 2021-12-30 DIAGNOSIS — D8989 Other specified disorders involving the immune mechanism, not elsewhere classified: Secondary | ICD-10-CM

## 2021-12-30 DIAGNOSIS — J9610 Chronic respiratory failure, unspecified whether with hypoxia or hypercapnia: Secondary | ICD-10-CM | POA: Diagnosis not present

## 2021-12-30 DIAGNOSIS — R5383 Other fatigue: Secondary | ICD-10-CM

## 2021-12-30 DIAGNOSIS — Z8701 Personal history of pneumonia (recurrent): Secondary | ICD-10-CM | POA: Diagnosis not present

## 2021-12-30 DIAGNOSIS — Z803 Family history of malignant neoplasm of breast: Secondary | ICD-10-CM | POA: Diagnosis not present

## 2021-12-30 LAB — CBC WITH DIFFERENTIAL/PLATELET
Abs Immature Granulocytes: 0.19 10*3/uL — ABNORMAL HIGH (ref 0.00–0.07)
Basophils Absolute: 1.4 10*3/uL — ABNORMAL HIGH (ref 0.0–0.1)
Basophils Relative: 1 %
Eosinophils Absolute: 0.3 10*3/uL (ref 0.0–0.5)
Eosinophils Relative: 0 %
HCT: 37.9 % (ref 36.0–46.0)
Hemoglobin: 11.7 g/dL — ABNORMAL LOW (ref 12.0–15.0)
Immature Granulocytes: 0 %
Lymphocytes Relative: 94 %
Lymphs Abs: 165.7 10*3/uL — ABNORMAL HIGH (ref 0.7–4.0)
MCH: 31.4 pg (ref 26.0–34.0)
MCHC: 30.9 g/dL (ref 30.0–36.0)
MCV: 101.6 fL — ABNORMAL HIGH (ref 80.0–100.0)
Monocytes Absolute: 7.5 10*3/uL — ABNORMAL HIGH (ref 0.1–1.0)
Monocytes Relative: 4 %
Neutro Abs: 2.5 10*3/uL (ref 1.7–7.7)
Neutrophils Relative %: 1 %
Platelets: 111 10*3/uL — ABNORMAL LOW (ref 150–400)
RBC: 3.73 MIL/uL — ABNORMAL LOW (ref 3.87–5.11)
RDW: 14.2 % (ref 11.5–15.5)
Smear Review: NORMAL
WBC: 177.6 10*3/uL (ref 4.0–10.5)
nRBC: 0 % (ref 0.0–0.2)

## 2021-12-30 LAB — LACTATE DEHYDROGENASE: LDH: 184 U/L (ref 98–192)

## 2021-12-30 LAB — COMPREHENSIVE METABOLIC PANEL
ALT: 12 U/L (ref 0–44)
AST: 25 U/L (ref 15–41)
Albumin: 4.4 g/dL (ref 3.5–5.0)
Alkaline Phosphatase: 112 U/L (ref 38–126)
Anion gap: 6 (ref 5–15)
BUN: 16 mg/dL (ref 8–23)
CO2: 25 mmol/L (ref 22–32)
Calcium: 9.4 mg/dL (ref 8.9–10.3)
Chloride: 107 mmol/L (ref 98–111)
Creatinine, Ser: 0.98 mg/dL (ref 0.44–1.00)
GFR, Estimated: 58 mL/min — ABNORMAL LOW (ref >60)
Glucose, Bld: 120 mg/dL — ABNORMAL HIGH (ref 70–99)
Potassium: 4.7 mmol/L (ref 3.5–5.1)
Sodium: 138 mmol/L (ref 135–145)
Total Bilirubin: 1.1 mg/dL (ref 0.3–1.2)
Total Protein: 6.6 g/dL (ref 6.5–8.1)

## 2021-12-30 NOTE — Telephone Encounter (Signed)
Spouse answered- approved contact- patient not home currently- asked if she could call with results from her dermatology visit she had this summer

## 2021-12-30 NOTE — Progress Notes (Signed)
[place] NOTE  Patient Care Team: Venita Lick, NP as PCP - General (Nurse Practitioner) [name], MD as PCP - Family Medicine (Family Medicine) Oneta Rack, MD (Dermatology) Manya Silvas, MD (Inactive) (Gastroenterology) Bary Castilla, Forest Gleason, MD (General Surgery) Cammie Sickle, MD as Consulting Physician (Oncology) Cammie Sickle, MD as Consulting Physician (Internal Medicine)  CHIEF COMPLAINTS/PURPOSE OF CONSULTATION:  CLL  #  Oncology History Overview Note  Rehana Uncapher is a 81 y.o. female with chronic lymphocytic leukemia (CLL).  WBC has ranged between 22,000 - 101,7000 since 05/2011.   She has received Rituxan x 2 four week cycles (06/27/2015 and 05/27/2017).  Hepatitis B surface antigen and hepatitis B surface antibody were negative on 07/04/2015.   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  CCND1, ATM, chromosome 12, and TP53 were normal.     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  There was a CD5 and CD23 positive monoclonal B cell population with lambda light chain restriction, negative for FMC7 and CD38, representing  88% of leukocytes, >5,000/uL. There was no loss of, or aberrant expression of, the pan T cell  antigens to  suggest a neoplastic T cell process. CD4:CD8 ratio 2.0  No circulating blasts were detected. There was no immunophenotypic evidence of abnormal myeloid maturation.  IGH/BCL2 by FISH is pending. --------------------------------------------------------  She began ibrutinib on 04/24/2019 (discontinued on 05/30/2019).                     She developed blood-streaked sputum (slight) then significant hematuria.                     She declined further ibrutinib. ---------------------------------------------------------------------------------             She began acalabrutinib on 03/14/2020 (held on 05/13/2020 secondary to diarrhea). --------------------------------------------------------------------------------------   She has a 35 pack year smoking history.  She is in the low dose chest CT program.  Low dose chest CT on 02/02/2018 revealed a new endobronchial lesion in the segmental bronchus to the medial segment of the right middle lobe. This may simply reflect an area of retained secretions, however, the possibility of an endobronchial neoplasm should be considered. Lung-RADS 4AS, suspicious.  Low dose chest CT on 07/05/2018 revealed Lung-RADS 2, benign appearance or behavior.  Prior right middle lobe endobronchial lesion/mucous plugging was no longer visualized.  New mucous plugging in the right lower lobe.  #Secondary immune deficiency [secondary to CLL; [FEB 2023IgG- 184]-]-multiple infections pneumonia [Feb 2023-[facility]]-IVIG infusions 400 mg/kg q   CLL (chronic lymphocytic leukemia) ([place])  05/28/2015 Initial Diagnosis   CLL (chronic lymphocytic leukemia) (HCC)    HISTORY OF PRESENTING ILLNESS: Frail-appearing Caucasian female patient.  Accompanied by her daughter.  [name] 81 y.o.  female CLL-13 q. Deletion currently on surveillance because of intolerance/side effects to multiple TKIs is here for follow-up/s/p IVIG infusions is here for follow-up.  She reports that overall she is doing well. Energy is stable with occasional fatigue. Overall she continues to feel better with time. They again discuss that she did not feel herself when on the various treatments for her CLL. Even though they know she is progressing she is clinically improving and they express that they do not want to do any interventions at this time.   No blood in stools  or black or stools.  Denies any new lumps or bumps. Weight is up as she is eating better  Wt Readings from Last 3 Encounters:  12/30/21 151 lb (68.5 kg)  12/09/21 149 lb 8 oz (67.8 kg)  10/29/21  149 lb 6.4 oz (67.8 kg)     Review of Systems  Constitutional:  Negative for chills, diaphoresis, fever, malaise/fatigue and weight loss.  HENT:  Negative for nosebleeds and sore throat.   Eyes:  Negative for double vision.  Respiratory:  Negative for cough, hemoptysis, sputum production, shortness of breath and wheezing.   Cardiovascular:  Negative for chest pain, palpitations, orthopnea and leg swelling.  Gastrointestinal:  Negative for abdominal pain, blood in stool, constipation, diarrhea, heartburn, melena, nausea and vomiting.  Genitourinary:  Negative for dysuria, frequency and urgency.  Musculoskeletal:  Positive for back pain. Negative for joint pain.  Skin: Negative.  Negative for itching and rash.  Neurological:  Negative for dizziness, tingling, focal weakness, weakness and headaches.  Endo/Heme/Allergies:  Does not bruise/bleed easily.  Psychiatric/Behavioral:  Negative for depression. The patient is not nervous/anxious and does not have insomnia.      MEDICAL HISTORY:  Past Medical History:  Diagnosis Date   Allergy    Anxiety    Depression    GERD (gastroesophageal reflux disease)    Hemorrhoids    Hypertension    Leukemia, lymphoid ([place])    CLL   Lobar pneumonia (HCC)    Osteopenia    Personal history of chemotherapy     SURGICAL HISTORY: Past Surgical History:  Procedure Laterality Date   ABDOMINAL HYSTERECTOMY     APPENDECTOMY     BREAST BIOPSY Left    bx x 3-neg   COLON SURGERY     sigmoid resection   COLONOSCOPY     2007, 2012   COLONOSCOPY WITH PROPOFOL N/A 09/17/2015   Procedure: COLONOSCOPY WITH PROPOFOL;  Surgeon: Robert Bellow, MD;  Location: [facility] ENDOSCOPY;  Service: Endoscopy;  Laterality: N/A;   OOPHORECTOMY     SPINE SURGERY     L4-5    SOCIAL HISTORY: Social History   Socioeconomic History   Marital status: Married    Spouse name: Not on file   Number of children: Not on file   Years of education: 12   Highest education  level: 12th grade  Occupational History   Not on file  Tobacco Use   Smoking status: Former    Packs/day: 1.00    Years: 35.00    Total pack years: 35.00    Types: Cigarettes    Quit date: 02/22/2021    Years since quitting: 0.8   Smokeless tobacco: Never  Vaping Use   Vaping Use: Never used  Substance and Sexual Activity   Alcohol use: No    Alcohol/week: 0.0 standard drinks of alcohol   Drug use: No   Sexual activity: Not on file  Other Topics Concern   Not on file  Social History Narrative   Not on file   Social Determinants of Health   Financial Resource Strain: Low Risk  (01/24/2017)   Overall Financial Resource Strain (CARDIA)    Difficulty of Paying Living Expenses: Not hard at all  Food Insecurity: No Food Insecurity (01/24/2017)   Hunger Vital Sign    Worried About Running Out of Food in the Last Year: Never true    Ran Out of Food in the Last Year: Never true  Transportation Needs: No Transportation Needs (01/24/2017)   PRAPARE -  Hydrologist (Medical): No    Lack of Transportation (Non-Medical): No  Physical Activity: Inactive (01/24/2017)   Exercise Vital Sign    Days of Exercise per Week: 0 days    Minutes of Exercise per Session: 0 min  Stress: Stress Concern Present (02/16/2018)   [place]    Feeling of Stress : To some extent  Social Connections: Moderately Integrated (01/24/2017)   Social Connection and Isolation Panel [NHANES]    Frequency of Communication with Friends and Family: More than three times a week    Frequency of Social Gatherings with Friends and Family: Once a week    Attends Religious Services: More than 4 times per year    Active Member of [employer] or Organizations: No    Attends Archivist Meetings: Never    Marital Status: Married  Human resources officer Violence: Not At Risk (01/24/2017)   Humiliation, Afraid, Rape, and Kick questionnaire     Fear of Current or Ex-Partner: No    Emotionally Abused: No    Physically Abused: No    Sexually Abused: No    FAMILY HISTORY: Family History  Problem Relation Age of Onset   Osteoporosis Mother    Hypertension Father    Heart attack Father    Stroke Maternal Grandfather    Breast cancer Sister 35    ALLERGIES:  is allergic to augmentin [amoxicillin-pot clavulanate] and biaxin [clarithromycin].  MEDICATIONS:  Current Outpatient Medications  Medication Sig Dispense Refill   acetaminophen (TYLENOL) 325 MG tablet Take by mouth.     albuterol (VENTOLIN HFA) 108 (90 Base) MCG/ACT inhaler Inhale 2 puffs into the lungs every 6 (six) hours as needed for wheezing or shortness of breath. 8 g 2   atorvastatin (LIPITOR) 10 MG tablet TAKE 1 TABLET BY MOUTH EVERY DAY 90 tablet 3   benazepril (LOTENSIN) 40 MG tablet TAKE 1 TABLET BY MOUTH EVERY DAY 90 tablet 0   Budeson-Glycopyrrol-Formoterol (BREZTRI AEROSPHERE) 160-9-4.8 MCG/ACT AERO Inhale 2 puffs into the lungs in the morning and at bedtime. 10.7 g 11   hydrocortisone (ANUSOL-HC) 25 MG suppository Place 1 suppository (25 mg total) rectally 2 (two) times daily. 12 suppository 12   LORazepam (ATIVAN) 1 MG tablet Take 0.5 tablets (0.5 mg total) by mouth 2 (two) times daily as needed for anxiety. 60 tablet 4   mometasone (NASONEX) 50 MCG/ACT nasal spray USE 1 SPRAY TO EACH NOSTRIL TWICE A DAY 51 each 1   Multiple Vitamins-Minerals (MULTIVITAMIN WITH MINERALS) tablet Take 1 tablet by mouth daily.     PARoxetine (PAXIL) 30 MG tablet TAKE 1 TABLET BY MOUTH EVERY DAY 90 tablet 1   Spacer/Aero-Holding Chambers (OPTICHAMBER DIAMOND) MISC 1 EACH BY OTHER ROUTE ONCE FOR 1 DOSE.     vitamin C (ASCORBIC ACID) 250 MG tablet Take 250 mg by mouth daily.     vitamin E 1000 UNIT capsule Take by mouth.     No current facility-administered medications for this visit.      Marland Kitchen  PHYSICAL EXAMINATION: ECOG PERFORMANCE STATUS: 1 - Symptomatic but completely  ambulatory  Vitals:   12/30/21 1104  BP: 118/67  Pulse: 75  Temp: 97.9 F (36.6 C)   Filed Weights   12/30/21 1104  Weight: 151 lb (68.5 kg)    Physical Exam HENT:     Head: Normocephalic and atraumatic.     Mouth/Throat:     Pharynx: No oropharyngeal exudate.  Eyes:     Pupils: Pupils are equal, round, and reactive to light.  Cardiovascular:     Rate and Rhythm: Normal rate and regular rhythm.  Pulmonary:     Effort: No respiratory distress.     Breath sounds: No wheezing.     Comments: Decreased air entry bilaterally at the bases. Abdominal:     General: Bowel sounds are normal. There is no distension.     Palpations: Abdomen is soft. There is no mass.     Tenderness: There is no abdominal tenderness. There is no guarding or rebound.  Musculoskeletal:        General: No tenderness. Normal range of motion.     Cervical back: Normal range of motion and neck supple.  Skin:    General: Skin is warm.  Neurological:     Mental Status: She is alert and oriented to person, place, and time.  Psychiatric:        Mood and Affect: Affect normal.      LABORATORY DATA:  I have reviewed the data as listed Lab Results  Component Value Date   WBC 177.6 (HH) 12/30/2021   HGB 11.7 (L) 12/30/2021   HCT 37.9 12/30/2021   MCV 101.6 (H) 12/30/2021   PLT 111 (L) 12/30/2021   Recent Labs    08/26/21 1045 10/29/21 1037 12/30/21 1052  NA 140 141 138  K 4.0 4.4 4.7  CL 106 108 107  CO2 _0 GLUCOSE 88 103* 120*  BUN _1 CREATININE 0.93 0.81 0.98  CALCIUM 8.9 9.2 9.4  GFRNONAA >60 >60 58*  PROT 7.0 6.8 6.6  ALBUMIN 4.1 4.2 4.4  AST _2 ALT _3 ALKPHOS 94 114 112  BILITOT 1.3* 0.7 1.1    RADIOGRAPHIC STUDIES: I have personally reviewed the radiological images as listed and agreed with the findings in the report. No results found.  ASSESSMENT & PLAN:  CLL (chronic lymphocytic leukemia) ([place]) #CLL-Rai stage II; FISH 13 q. Deletion-poor  tolerance to Ibrutinib-hematuria; ; and Acalburtinib [stopped in March 2022 because of diarrhea].     #From a clinical standpoint patient's CLL is PROGRESSING  however she continues to feel better with time in terms of her pain and fatigue. We reviewed her labs from today which show continued elevation of her WBCs from 151.8 to 177.6 within the last two months along with new anemia of 11.7, and continued thrombocytopenia of 111. 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   JUNE immunoglobulins- IgG- 700+. IVIG has been on hold due to patient preference as she was getting sick more often on them and was not tolerating them well. Immunoglobulins pending today. At this time given patient preference -agreeable by daughter- we will hold off on her IVIG.    #COPD/history of pneumonia -bilateral Jan 2023 [QUITsmoking-April, 2023; [facility]]-right middle lobe lung atelectasis- status post evaluation with Dr.Gonzalez; May 2023-continued  Persistent complete collapse right middle lobe with opacification of the right middle lobe bronchus; with some improvement in the lower lobe.  Continue compliance with inhalers and follow-up with pulmonary.  Stable at this time. CT chest has been ordered and is pending insurance approval. AT this time she is feeling well.   # was seen by Dermatology on 10/22/2021 for evaluation of skin lesion concerning for SCC. Pt calling back  with results of this biopsy.    #Ongoing fatigue multifactorial-underlying chronic respiratory failure; underlying CLL; possible depression/anxiety. See above regarding their decision to continue holding treatment.   # DISPOSITION:  # Follow up in 2 months [wed/thursday] ;MD; labs- CBC/CMP;LDH;quantitative immunoglobulins   All questions were answered. The patient knows to  call the clinic with any problems, questions or concerns.   Hughie Closs, PA-C 12/30/2021 2:07 PM

## 2022-01-03 LAB — IMMUNOGLOBULINS A/E/G/M, SERUM
IgA: 13 mg/dL — ABNORMAL LOW (ref 64–422)
IgE (Immunoglobulin E), Serum: <2 IU/mL — ABNORMAL LOW (ref 6–495)
IgG (Immunoglobin G), Serum: 278 mg/dL — ABNORMAL LOW (ref 586–1602)
IgM (Immunoglobulin M), Srm: <5 mg/dL — ABNORMAL LOW (ref 26–217)

## 2022-01-07 ENCOUNTER — Ambulatory Visit: Payer: Medicare HMO | Admitting: Pulmonary Disease

## 2022-01-07 ENCOUNTER — Encounter: Payer: Self-pay | Admitting: Pulmonary Disease

## 2022-01-07 VITALS — BP 118/72 | HR 76 | Temp 97.1°F | Ht 63.0 in | Wt 149.4 lb

## 2022-01-07 DIAGNOSIS — J449 Chronic obstructive pulmonary disease, unspecified: Secondary | ICD-10-CM | POA: Diagnosis not present

## 2022-01-07 DIAGNOSIS — C9110 Chronic lymphocytic leukemia of B-cell type not having achieved remission: Secondary | ICD-10-CM | POA: Diagnosis not present

## 2022-01-07 DIAGNOSIS — J479 Bronchiectasis, uncomplicated: Secondary | ICD-10-CM

## 2022-01-07 DIAGNOSIS — D803 Selective deficiency of immunoglobulin G [IgG] subclasses: Secondary | ICD-10-CM

## 2022-01-07 DIAGNOSIS — R0989 Other specified symptoms and signs involving the circulatory and respiratory systems: Secondary | ICD-10-CM | POA: Diagnosis not present

## 2022-01-07 NOTE — Patient Instructions (Signed)
Your lungs sounded really good today.  Try decreasing the intensity of the vest to see if this helps with the issues you are having.  We will see you in follow-up in 4 to 6 months time call sooner should any new problems arise.

## 2022-01-07 NOTE — Progress Notes (Signed)
Subjective:    Patient ID: Katherine [name], female    DOB: 1940-11-10, 81 y.o.   MRN: [id number] Patient Care Team: Venita Lick, NP as PCP - General (Nurse Practitioner) Oneta Rack, MD (Dermatology) Manya Silvas, MD (Inactive) (Gastroenterology) Bary Castilla, Forest Gleason, MD (General Surgery) Cammie Sickle, MD as Consulting Physician (Oncology) Tyler Pita, MD as Consulting Physician (Pulmonary Disease)  Chief Complaint  Patient presents with   Follow-up    Breathing is doing good. NO SOB or wheezing. Dry cough.   HPI This is an 81 year old recent former smoker (quit January 2023) with 35-pack-year history of smoking who presents for follow-up on abnormal chest CT. she was last seen here on 14 October 2021.  At that time she was using an InCourage vest to manage mucus plugging due to bronchiectasis.  She has noted great improvement in her symptoms with the InCourage vest.  She has moderate COPD by prior PFTs.  Previously, she had resolution of left lower lobe atelectasis after aggressive pulmonary toilet and management of her COPD.  She has a history of CLL and IgG deficiency and bronchiectasis.  Since starting the vest physiotherapy she has noted that her sputum is less tenacious and easier to bring up. She uses therapy twice a day.  She has been religious with this.  She is compliant with Breztri 2 puffs twice a day.  Her only complaint today is some issues with nasal congestion which occurs seasonally.    She gets IVIG infusions for her IgG deficiency related to her CLL.   Her last CT chest was on 08 Jul 2021 she was due for reimaging however, insurance denied the scan.   The patient does not endorse any fevers, chills or sweats.  Cough has improved dramatically since starting on vest chest physiotherapy.  She has not had any hemoptysis.  No chest pain.  During her last encounter she was noted to be quite debilitated and appeared frail.  Today she is fully  ambulatory and quite spry.   Review of Systems A 10 point review of systems was performed and it is as noted above otherwise negative.  Patient Active Problem List   Diagnosis Date Noted   Controlled substance agreement signed 12/09/2021   Type O blood, Rh negative 05/09/2021   Secondary immune deficiency disorder ([place]) 04/19/2021   Long-term current use of benzodiazepine 02/05/2020   Splenomegaly 02/02/2020   Thrombocytopenia ([place]) 02/02/2020   Allergic rhinitis 07/18/2018   Goals of care, counseling/discussion 07/05/2018   Nicotine dependence, cigarettes, uncomplicated 22/48/2500   Centrilobular emphysema ([place]) 02/05/2018   Advanced care planning/counseling discussion 01/09/2018   Anxiety 01/04/2017   Hypercholesterolemia 01/08/2016   Atherosclerosis of aorta ([place]) 01/07/2016   History of colonic polyps 08/14/2015   CLL (chronic lymphocytic leukemia) ([place]) 05/28/2015   Senile purpura ([place]) 01/06/2015   Essential hypertension 01/06/2015   Depression, recurrent ([place]) 01/06/2015   Social History   Tobacco Use   Smoking status: Former    Packs/day: 1.00    Years: 35.00    Total pack years: 35.00    Types: Cigarettes    Quit date: 02/22/2021    Years since quitting: 0.8   Smokeless tobacco: Never  Substance Use Topics   Alcohol use: No    Alcohol/week: 0.0 standard drinks of alcohol   Allergies  Allergen Reactions   Augmentin [Amoxicillin-Pot Clavulanate] Swelling    tongue   Biaxin [Clarithromycin] Swelling and Rash    tongue   Current  Meds  Medication Sig   acetaminophen (TYLENOL) 325 MG tablet Take by mouth.   albuterol (VENTOLIN HFA) 108 (90 Base) MCG/ACT inhaler Inhale 2 puffs into the lungs every 6 (six) hours as needed for wheezing or shortness of breath.   atorvastatin (LIPITOR) 10 MG tablet TAKE 1 TABLET BY MOUTH EVERY DAY   benazepril (LOTENSIN) 40 MG tablet TAKE 1 TABLET BY MOUTH EVERY DAY   Budeson-Glycopyrrol-Formoterol (BREZTRI AEROSPHERE) 160-9-4.8  MCG/ACT AERO Inhale 2 puffs into the lungs in the morning and at bedtime.   hydrocortisone (ANUSOL-HC) 25 MG suppository Place 1 suppository (25 mg total) rectally 2 (two) times daily.   LORazepam (ATIVAN) 1 MG tablet Take 0.5 tablets (0.5 mg total) by mouth 2 (two) times daily as needed for anxiety.   mometasone (NASONEX) 50 MCG/ACT nasal spray USE 1 SPRAY TO EACH NOSTRIL TWICE A DAY   Multiple Vitamins-Minerals (MULTIVITAMIN WITH MINERALS) tablet Take 1 tablet by mouth daily.   PARoxetine (PAXIL) 30 MG tablet TAKE 1 TABLET BY MOUTH EVERY DAY   Spacer/Aero-Holding Chambers (OPTICHAMBER DIAMOND) MISC 1 EACH BY OTHER ROUTE ONCE FOR 1 DOSE.   vitamin C (ASCORBIC ACID) 250 MG tablet Take 250 mg by mouth daily.   vitamin E 1000 UNIT capsule Take by mouth.   Immunization History  Administered Date(s) Administered   Fluad Quad(high Dose 65+) 11/08/2019, 11/12/2020, 12/09/2021   Influenza, High Dose Seasonal PF 01/04/2017, 11/07/2018   Influenza,inj,Quad PF,6+ Mos 01/06/2015, 11/21/2015, 10/07/2017   Influenza-Unspecified 01/06/2015, 11/21/2015, 01/04/2017, 10/07/2017, 11/07/2018, 12/03/2020   PFIZER(Purple Top)SARS-COV-2 Vaccination 05/11/2019, 06/01/2019, 02/05/2020   Pneumococcal Conjugate-13 03/06/2014   Pneumococcal-Unspecified 02/22/1993, 02/23/2003   Td 07/24/2003   Tdap 11/18/2010   Zoster Recombinat (Shingrix) 08/02/2017, 02/04/2018   Zoster, Live 11/03/2005       Objective:   Physical Exam BP 118/72 (BP Location: Right Arm, Cuff Size: Normal)   Pulse 76   Temp (!) 97.1 F (36.2 C)   Ht '5\' 3"'$  (1.6 m)   Wt 149 lb 6.4 oz (67.8 kg)   SpO2 95%   BMI 26.47 kg/m  GENERAL: Well-developed, thin woman, well-groomed, spry, no acute distress, fully ambulatory.  Stational dyspnea. HEAD: Normocephalic, atraumatic.  EYES: Pupils equal, round, reactive to light.  No scleral icterus.  MOUTH: Oral mucosa moist.  Dentition intact. NECK: Supple. No thyromegaly. Trachea midline. No JVD.  No  adenopathy. PULMONARY: Good air entry bilaterally.  Coarse breath sounds otherwise, no adventitious sounds. CARDIOVASCULAR: S1 and S2. Regular rate and rhythm.  No rubs, murmurs or gallops heard. ABDOMEN: Benign. MUSCULOSKELETAL: No joint deformity, no clubbing, trace to no edema of the feet/ankles noted today. NEUROLOGIC: No focal deficit noted, no gait disturbance, speech is fluent. SKIN: Intact,warm,dry.  Multiple ecchymosis/senile purpura particularly upper extremities. PSYCH: Mood and behavior normal.     Assessment & Plan:     ICD-10-CM   1. Bronchiectasis without complication (HCC)  Q76.2    Continue vest physiotherapy Doing well with vest physiotherapy Etiology likely due to IgG deficiency related to CLL    2. Stage 2 moderate COPD by GOLD classification (HCC)  J44.9    Continue Breztri 2 puffs twice a day Continue as needed albuterol    3. Abnormal finding of lung  R09.89    Insurance denied follow-up CT Follow-up clinically Pulmonary secretion management improved with vest physiotherapy    4. IgG deficiency (HCC)  D80.3    Related to CLL On IVIG supplementation This issue adds complexity to her management    5.  CLL (chronic lymphocytic leukemia) (HCC)  C91.10    This issue adds complexity to her management Follows with oncology (Dr. Rogue Bussing)     Patient will continue using vest physiotherapy at home.  She occasionally does note some mild discomfort with this I have advised her to adjust the pressure on those days.  She knows how to do this.  She does get good results with expectoration of copious amounts of sputum when she does this.  We will see her in follow-up in 4 to 6 months time she is to contact us prior to that time should any new difficulties arise.  Renold Don, MD Advanced Bronchoscopy PCCM [facility] [facility] [place]    *This note was dictated using voice recognition software/Dragon.  Despite best efforts to proofread, errors can occur  which can change the meaning. Any transcriptional errors that result from this process are unintentional and may not be fully corrected at the time of dictation.

## 2022-01-11 ENCOUNTER — Other Ambulatory Visit: Payer: Self-pay | Admitting: Nurse Practitioner

## 2022-01-11 DIAGNOSIS — F339 Major depressive disorder, recurrent, unspecified: Secondary | ICD-10-CM

## 2022-01-11 NOTE — Telephone Encounter (Signed)
Requested medication (s) are due for refill today - no  Requested medication (s) are on the active medication list -yes  Future visit scheduled -yes  Last refill: 12/09/21 #60 4RF  Notes to clinic: non delegated Rx  Requested Prescriptions  Pending Prescriptions Disp Refills   LORazepam (ATIVAN) 1 MG tablet [Pharmacy Med Name: LORAZEPAM 1 MG TABLET] 60 tablet     Sig: Take 0.5 tablets (0.5 mg total) by mouth 2 (two) times daily as needed for anxiety.     Not Delegated - Psychiatry: Anxiolytics/Hypnotics 2 Failed - 01/11/2022 11:11 AM      Failed - This refill cannot be delegated      Passed - Urine Drug Screen completed in last 360 days      Passed - Patient is not pregnant      Passed - Valid encounter within last 6 months    Recent Outpatient Visits           1 month ago CLL (chronic lymphocytic leukemia) ([place])   [place] Cannady, Jolene T, NP   4 months ago CLL (chronic lymphocytic leukemia) ([place])   [place] Cannady, Jolene T, NP   8 months ago Centrilobular emphysema ([place])   [place], Barbaraann Faster, NP   9 months ago Community acquired pneumonia of right middle lobe of lung   [place], Jolene T, NP   1 year ago CLL (chronic lymphocytic leukemia) ([place])   [place], Washburn T, NP       Future Appointments             In 5 months Cannady, Barbaraann Faster, NP [facility]               Requested Prescriptions  Pending Prescriptions Disp Refills   LORazepam (ATIVAN) 1 MG tablet [Pharmacy Med Name: LORAZEPAM 1 MG TABLET] 60 tablet     Sig: Take 0.5 tablets (0.5 mg total) by mouth 2 (two) times daily as needed for anxiety.     Not Delegated - Psychiatry: Anxiolytics/Hypnotics 2 Failed - 01/11/2022 11:11 AM      Failed - This refill cannot be delegated      Passed - Urine Drug Screen completed in last 360 days      Passed - Patient is not pregnant       Passed - Valid encounter within last 6 months    Recent Outpatient Visits           1 month ago CLL (chronic lymphocytic leukemia) ([place])   [place] Cannady, Jolene T, NP   4 months ago CLL (chronic lymphocytic leukemia) ([place])   [place] Cannady, Jolene T, NP   8 months ago Centrilobular emphysema ([place])   [place], Barbaraann Faster, NP   9 months ago Community acquired pneumonia of right middle lobe of lung   [facility] Cannady, Jolene T, NP   1 year ago CLL (chronic lymphocytic leukemia) ([place])   [place], Barbaraann Faster, NP       Future Appointments             In 5 months Cannady, Barbaraann Faster, NP [facility]

## 2022-01-12 DIAGNOSIS — Z859 Personal history of malignant neoplasm, unspecified: Secondary | ICD-10-CM | POA: Diagnosis not present

## 2022-01-12 DIAGNOSIS — L578 Other skin changes due to chronic exposure to nonionizing radiation: Secondary | ICD-10-CM | POA: Diagnosis not present

## 2022-01-12 DIAGNOSIS — J449 Chronic obstructive pulmonary disease, unspecified: Secondary | ICD-10-CM | POA: Diagnosis not present

## 2022-01-12 DIAGNOSIS — J479 Bronchiectasis, uncomplicated: Secondary | ICD-10-CM | POA: Diagnosis not present

## 2022-01-12 DIAGNOSIS — L821 Other seborrheic keratosis: Secondary | ICD-10-CM | POA: Diagnosis not present

## 2022-01-12 DIAGNOSIS — D225 Melanocytic nevi of trunk: Secondary | ICD-10-CM | POA: Diagnosis not present

## 2022-01-16 ENCOUNTER — Other Ambulatory Visit: Payer: Self-pay | Admitting: Nurse Practitioner

## 2022-01-16 DIAGNOSIS — F339 Major depressive disorder, recurrent, unspecified: Secondary | ICD-10-CM

## 2022-01-19 NOTE — Telephone Encounter (Signed)
Requested medications are due for refill today.  no  Requested medications are on the active medications list.  yes  Last refill. 12/09/2021 #60 4 rf  Future visit scheduled.   yes  Notes to clinic.  Refill not delegated. Refill not due.     Requested Prescriptions  Pending Prescriptions Disp Refills   LORazepam (ATIVAN) 1 MG tablet [Pharmacy Med Name: LORAZEPAM 1 MG TABLET] 60 tablet     Sig: Take 0.5 tablets (0.5 mg total) by mouth 2 (two) times daily as needed for anxiety.     Not Delegated - Psychiatry: Anxiolytics/Hypnotics 2 Failed - 01/16/2022 12:09 PM      Failed - This refill cannot be delegated      Passed - Urine Drug Screen completed in last 360 days      Passed - Patient is not pregnant      Passed - Valid encounter within last 6 months    Recent Outpatient Visits           1 month ago CLL (chronic lymphocytic leukemia) ([place])   [place] Cannady, Jolene T, NP   5 months ago CLL (chronic lymphocytic leukemia) ([place])   [place] Cannady, Jolene T, NP   8 months ago Centrilobular emphysema ([place])   [place], Barbaraann Faster, NP   10 months ago Community acquired pneumonia of right middle lobe of lung   [facility] Cannady, Jolene T, NP   1 year ago CLL (chronic lymphocytic leukemia) ([place])   [place], Barbaraann Faster, NP       Future Appointments             In 4 months Cannady, Barbaraann Faster, NP [facility]

## 2022-01-27 ENCOUNTER — Other Ambulatory Visit: Payer: Self-pay | Admitting: *Deleted

## 2022-01-27 DIAGNOSIS — C9110 Chronic lymphocytic leukemia of B-cell type not having achieved remission: Secondary | ICD-10-CM

## 2022-01-28 ENCOUNTER — Encounter: Payer: Self-pay | Admitting: Internal Medicine

## 2022-01-28 ENCOUNTER — Inpatient Hospital Stay: Payer: Medicare HMO | Admitting: Internal Medicine

## 2022-01-28 ENCOUNTER — Inpatient Hospital Stay: Payer: Medicare HMO | Attending: Internal Medicine

## 2022-01-28 VITALS — BP 125/72 | HR 70 | Temp 97.3°F | Resp 18 | Wt 150.2 lb

## 2022-01-28 DIAGNOSIS — D849 Immunodeficiency, unspecified: Secondary | ICD-10-CM | POA: Insufficient documentation

## 2022-01-28 DIAGNOSIS — Z8701 Personal history of pneumonia (recurrent): Secondary | ICD-10-CM | POA: Diagnosis not present

## 2022-01-28 DIAGNOSIS — J9610 Chronic respiratory failure, unspecified whether with hypoxia or hypercapnia: Secondary | ICD-10-CM | POA: Insufficient documentation

## 2022-01-28 DIAGNOSIS — J449 Chronic obstructive pulmonary disease, unspecified: Secondary | ICD-10-CM | POA: Insufficient documentation

## 2022-01-28 DIAGNOSIS — C9110 Chronic lymphocytic leukemia of B-cell type not having achieved remission: Secondary | ICD-10-CM | POA: Insufficient documentation

## 2022-01-28 DIAGNOSIS — Z87891 Personal history of nicotine dependence: Secondary | ICD-10-CM | POA: Diagnosis not present

## 2022-01-28 LAB — CBC WITH DIFFERENTIAL/PLATELET
Abs Immature Granulocytes: 0.17 10*3/uL — ABNORMAL HIGH (ref 0.00–0.07)
Basophils Absolute: 0.1 10*3/uL (ref 0.0–0.1)
Basophils Relative: 0 %
Eosinophils Absolute: 0.2 10*3/uL (ref 0.0–0.5)
Eosinophils Relative: 0 %
HCT: 39.7 % (ref 36.0–46.0)
Hemoglobin: 12.4 g/dL (ref 12.0–15.0)
Immature Granulocytes: 0 %
Lymphocytes Relative: 96 %
Lymphs Abs: 186.5 10*3/uL — ABNORMAL HIGH (ref 0.7–4.0)
MCH: 31.9 pg (ref 26.0–34.0)
MCHC: 31.2 g/dL (ref 30.0–36.0)
MCV: 102.1 fL — ABNORMAL HIGH (ref 80.0–100.0)
Monocytes Absolute: 6.2 10*3/uL — ABNORMAL HIGH (ref 0.1–1.0)
Monocytes Relative: 3 %
Neutro Abs: 2.5 10*3/uL (ref 1.7–7.7)
Neutrophils Relative %: 1 %
Platelets: 100 10*3/uL — ABNORMAL LOW (ref 150–400)
RBC: 3.89 MIL/uL (ref 3.87–5.11)
RDW: 14.6 % (ref 11.5–15.5)
Smear Review: NORMAL
WBC: 195.7 10*3/uL (ref 4.0–10.5)
nRBC: 0 % (ref 0.0–0.2)

## 2022-01-28 LAB — COMPREHENSIVE METABOLIC PANEL
ALT: 13 U/L (ref 0–44)
AST: 24 U/L (ref 15–41)
Albumin: 4.7 g/dL (ref 3.5–5.0)
Alkaline Phosphatase: 118 U/L (ref 38–126)
Anion gap: 13 (ref 5–15)
BUN: 12 mg/dL (ref 8–23)
CO2: 28 mmol/L (ref 22–32)
Calcium: 8.9 mg/dL (ref 8.9–10.3)
Chloride: 101 mmol/L (ref 98–111)
Creatinine, Ser: 0.82 mg/dL (ref 0.44–1.00)
GFR, Estimated: >60 mL/min (ref >60)
Glucose, Bld: 101 mg/dL — ABNORMAL HIGH (ref 70–99)
Potassium: 4.6 mmol/L (ref 3.5–5.1)
Sodium: 142 mmol/L (ref 135–145)
Total Bilirubin: 1.2 mg/dL (ref 0.3–1.2)
Total Protein: 6.8 g/dL (ref 6.5–8.1)

## 2022-01-28 LAB — LACTATE DEHYDROGENASE: LDH: 185 U/L (ref 98–192)

## 2022-01-28 NOTE — Assessment & Plan Note (Addendum)
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 Deletion-poor tolerance to Ibrutinib-hematuria; ; and Acalburtinib [stopped in March 2022 because of diarrhea].    #From a clinical standpoint patient's CLL is PROGRESSING [although patient's symptoms-of fatigue are quite subjective];; ALSO patient's white count is rising-White count is  195.  Currently-hemoglobin 12.5 platelets slightly low at 90-120s.   Continue surveillance given patient's preference [see below]-overall clinically stable.  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   NOV 2023- immunoglobulins- IgG- 230.  I reviewed importance of re-starting IVIG infusions to prevent infections.  Patient reluctantly agrees.  She prefers to every 2 months.    #COPD/history of pneumonia -bilateral Jan 2023 [QUITsmoking-April, 2023; [facility]]-right middle lobe lung atelectasis- status post evaluation with Dr.Gonzalez; May 2023-continued  Persistent complete collapse right middle lobe with opacification of the right middle lobe bronchus; with some improvement in the lower lobe.  Continue compliance with inhalers and follow-up with pulmonary.  STABLE. Awaiting CT scan- [declined by insurance]  # Skin lesions- ? SCC Left UE-s/p excision by Dr.Graham  #Ongoing fatigue multifactorial-underlying chronic respiratory failure; underlying CLL; possible depression/anxiety.  Reasonable to hold any treatment at this time.  If continued get worse would recommend treatment for CLL.  # Vaccination: s/p flu shot; discussed regarding covid/RSV vaccinations.  # DISPOSITION:  # week after christmas- IVIG infusion # Follow up in 2 months [wed/thursday] ;MD; labs- CBC/CMP;LDH; SAME DAY -IVIG [pt request]-Dr.B

## 2022-01-28 NOTE — Progress Notes (Signed)
Patient denies new problems/concerns today.   °

## 2022-01-28 NOTE — Progress Notes (Signed)
[place] NOTE  Patient Care Team: Venita Lick, NP as PCP - General (Nurse Practitioner) Oneta Rack, MD (Dermatology) Manya Silvas, MD (Inactive) (Gastroenterology) Bary Castilla [place] Gleason, MD (General Surgery) Cammie Sickle, MD as Consulting Physician (Oncology) Tyler Pita, MD as Consulting Physician (Pulmonary Disease)  CHIEF COMPLAINTS/PURPOSE OF CONSULTATION:  CLL  #  Oncology History Overview Note  Katherine [name] is a 81 y.o. female with chronic lymphocytic leukemia (CLL).  WBC has ranged between 22,000 - 101,7000 since 05/2011.   She has received Rituxan x 2 four week cycles (06/27/2015 and 05/27/2017).  Hepatitis B surface antigen and hepatitis B surface antibody were negative on 07/04/2015.   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  CCND1, ATM, chromosome 12, and TP53 were normal.     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  There was a CD5 and CD23 positive monoclonal B cell population with lambda light chain restriction, negative for FMC7 and CD38, representing  88% of leukocytes, >5,000/uL. There was no loss of, or aberrant expression of, the pan T cell  antigens to  suggest a neoplastic T cell process. CD4:CD8 ratio 2.0  No circulating blasts were detected. There was no immunophenotypic evidence of abnormal myeloid maturation.  IGH/BCL2 by FISH is pending. --------------------------------------------------------  She began ibrutinib on 04/24/2019 (discontinued on 05/30/2019).                     She developed blood-streaked sputum (slight) then significant hematuria.                     She declined further ibrutinib. ---------------------------------------------------------------------------------            She began acalabrutinib on 03/14/2020 (held on 05/13/2020 secondary  to diarrhea). --------------------------------------------------------------------------------------   She has a 35 pack year smoking history.  She is in the low dose chest CT program.  Low dose chest CT on 02/02/2018 revealed a new endobronchial lesion in the segmental bronchus to the medial segment of the right middle lobe. This may simply reflect an area of retained secretions, however, the possibility of an endobronchial neoplasm should be considered. Lung-RADS 4AS, suspicious.  Low dose chest CT on 07/05/2018 revealed Lung-RADS 2, benign appearance or behavior.  Prior right middle lobe endobronchial lesion/mucous plugging was no longer visualized.  New mucous plugging in the right lower lobe.  #Secondary immune deficiency [secondary to CLL; [FEB 2023IgG- 184]-]-multiple infections pneumonia [Feb 2023-[facility]]-IVIG infusions 400 mg/kg q   CLL (chronic lymphocytic leukemia) ([place])  05/28/2015 Initial Diagnosis   CLL (chronic lymphocytic leukemia) ([facility])    HISTORY OF PRESENTING ILLNESS: Frail-appearing Caucasian female patient.  Accompanied by her daughter.  Katherine [name] 81 y.o.  female CLL-13 q. Deletion currently on surveillance because of intolerance/side effects to multiple TKIs is here for follow-up/s/p IVIG infusions is here for follow-up.  Patient denies new problems/concerns today.  Patient denies any recent infections.  No hospitalizations.  Patient energy levels wax and wane.  However she is able to still able to continue ADLs; drives locally.   Chronic mild swelling in the legs.  No blood in stools or black or stools.  Denies any new lumps or bumps.  Review of Systems  Constitutional:  Positive for malaise/fatigue. Negative for chills, diaphoresis, fever and weight loss.  HENT:  Negative for nosebleeds  and sore throat.   Eyes:  Negative for double vision.  Respiratory:  Positive for hemoptysis and shortness of breath. Negative for cough, sputum production and wheezing.    Cardiovascular:  Negative for chest pain, palpitations, orthopnea and leg swelling.  Gastrointestinal:  Negative for abdominal pain, blood in stool, constipation, diarrhea, heartburn, melena, nausea and vomiting.  Genitourinary:  Negative for dysuria, frequency and urgency.  Musculoskeletal:  Positive for back pain. Negative for joint pain.  Skin: Negative.  Negative for itching and rash.  Neurological:  Negative for dizziness, tingling, focal weakness, weakness and headaches.  Endo/Heme/Allergies:  Does not bruise/bleed easily.  Psychiatric/Behavioral:  Negative for depression. The patient is not nervous/anxious and does not have insomnia.      MEDICAL HISTORY:  Past Medical History:  Diagnosis Date   Allergy    Anxiety    Depression    GERD (gastroesophageal reflux disease)    Hemorrhoids    Hypertension    Leukemia, lymphoid ([place])    CLL   Lobar pneumonia ([facility])    Osteopenia    Personal history of chemotherapy     SURGICAL HISTORY: Past Surgical History:  Procedure Laterality Date   ABDOMINAL HYSTERECTOMY     APPENDECTOMY     BREAST BIOPSY Left    bx x 3-neg   COLON SURGERY     sigmoid resection   COLONOSCOPY     2007, 2012   COLONOSCOPY WITH PROPOFOL N/A 09/17/2015   Procedure: COLONOSCOPY WITH PROPOFOL;  Surgeon: Robert Bellow, MD;  Location: [facility] ENDOSCOPY;  Service: Endoscopy;  Laterality: N/A;   OOPHORECTOMY     SPINE SURGERY     L4-5    SOCIAL HISTORY: Social History   Socioeconomic History   Marital status: Married    Spouse name: Not on file   Number of children: Not on file   Years of education: 12   Highest education level: 12th grade  Occupational History   Not on file  Tobacco Use   Smoking status: Former    Packs/day: 1.00    Years: 35.00    Total pack years: 35.00    Types: Cigarettes    Quit date: 02/22/2021    Years since quitting: 0.9   Smokeless tobacco: Never  Vaping Use   Vaping Use: Never used  Substance and Sexual Activity    Alcohol use: No    Alcohol/week: 0.0 standard drinks of alcohol   Drug use: No   Sexual activity: Not on file  Other Topics Concern   Not on file  Social History Narrative   Not on file   Social Determinants of Health   Financial Resource Strain: Low Risk  (01/24/2017)   Overall Financial Resource Strain (CARDIA)    Difficulty of Paying Living Expenses: Not hard at all  Food Insecurity: No Food Insecurity (01/24/2017)   Hunger Vital Sign    Worried About Running Out of Food in the Last Year: Never true    Ran Out of Food in the Last Year: Never true  Transportation Needs: No Transportation Needs (01/24/2017)   PRAPARE - Hydrologist (Medical): No    Lack of Transportation (Non-Medical): No  Physical Activity: Inactive (01/24/2017)   Exercise Vital Sign    Days of Exercise per Week: 0 days    Minutes of Exercise per Session: 0 min  Stress: Stress Concern Present (02/16/2018)   [place]    Feeling of  Stress : To some extent  Social Connections: Moderately Integrated (01/24/2017)   Social Connection and Isolation Panel [NHANES]    Frequency of Communication with Friends and Family: More than three times a week    Frequency of Social Gatherings with Friends and Family: Once a week    Attends Religious Services: More than 4 times per year    Active Member of [employer] or Organizations: No    Attends Archivist Meetings: Never    Marital Status: Married  Human resources officer Violence: Not At Risk (01/24/2017)   Humiliation, Afraid, Rape, and Kick questionnaire    Fear of Current or Ex-Partner: No    Emotionally Abused: No    Physically Abused: No    Sexually Abused: No    FAMILY HISTORY: Family History  Problem Relation Age of Onset   Osteoporosis Mother    Hypertension Father    Heart attack Father    Stroke Maternal Grandfather    Breast cancer Sister 64    ALLERGIES:  is  allergic to augmentin [amoxicillin-pot clavulanate] and biaxin [clarithromycin].  MEDICATIONS:  Current Outpatient Medications  Medication Sig Dispense Refill   acetaminophen (TYLENOL) 325 MG tablet Take by mouth.     albuterol (VENTOLIN HFA) 108 (90 Base) MCG/ACT inhaler Inhale 2 puffs into the lungs every 6 (six) hours as needed for wheezing or shortness of breath. 8 g 2   atorvastatin (LIPITOR) 10 MG tablet TAKE 1 TABLET BY MOUTH EVERY DAY 90 tablet 3   benazepril (LOTENSIN) 40 MG tablet TAKE 1 TABLET BY MOUTH EVERY DAY 90 tablet 0   Budeson-Glycopyrrol-Formoterol (BREZTRI AEROSPHERE) 160-9-4.8 MCG/ACT AERO Inhale 2 puffs into the lungs in the morning and at bedtime. 10.7 g 11   hydrocortisone (ANUSOL-HC) 25 MG suppository Place 1 suppository (25 mg total) rectally 2 (two) times daily. 12 suppository 12   LORazepam (ATIVAN) 1 MG tablet Take 0.5 tablets (0.5 mg total) by mouth 2 (two) times daily as needed for anxiety. 60 tablet 4   mometasone (NASONEX) 50 MCG/ACT nasal spray USE 1 SPRAY TO EACH NOSTRIL TWICE A DAY 51 each 1   Multiple Vitamins-Minerals (MULTIVITAMIN WITH MINERALS) tablet Take 1 tablet by mouth daily.     PARoxetine (PAXIL) 30 MG tablet TAKE 1 TABLET BY MOUTH EVERY DAY 90 tablet 1   Spacer/Aero-Holding Chambers (OPTICHAMBER DIAMOND) MISC 1 EACH BY OTHER ROUTE ONCE FOR 1 DOSE.     vitamin C (ASCORBIC ACID) 250 MG tablet Take 250 mg by mouth daily.     vitamin E 1000 UNIT capsule Take by mouth.     No current facility-administered medications for this visit.      Marland Kitchen  PHYSICAL EXAMINATION: ECOG PERFORMANCE STATUS: 1 - Symptomatic but completely ambulatory  Vitals:   01/28/22 1000  BP: 125/72  Pulse: 70  Resp: 18  Temp: (!) 97.3 F (36.3 C)   Filed Weights   01/28/22 1000  Weight: 150 lb 3.2 oz (68.1 kg)    Physical Exam HENT:     Head: Normocephalic and atraumatic.     Mouth/Throat:     Pharynx: No oropharyngeal exudate.  Eyes:     Pupils: Pupils are  equal, round, and reactive to light.  Cardiovascular:     Rate and Rhythm: Normal rate and regular rhythm.  Pulmonary:     Effort: No respiratory distress.     Breath sounds: No wheezing.     Comments: Decreased air entry bilaterally at the bases. Abdominal:  General: Bowel sounds are normal. There is no distension.     Palpations: Abdomen is soft. There is no mass.     Tenderness: There is no abdominal tenderness. There is no guarding or rebound.  Musculoskeletal:        General: No tenderness. Normal range of motion.     Cervical back: Normal range of motion and neck supple.  Skin:    General: Skin is warm.  Neurological:     Mental Status: She is alert and oriented to person, place, and time.  Psychiatric:        Mood and Affect: Affect normal.      LABORATORY DATA:  I have reviewed the data as listed Lab Results  Component Value Date   WBC 195.7 (HH) 01/28/2022   HGB 12.4 01/28/2022   HCT 39.7 01/28/2022   MCV 102.1 (H) 01/28/2022   PLT 100 (L) 01/28/2022   Recent Labs    10/29/21 1037 12/30/21 1052 01/28/22 0949  NA 141 138 142  K 4.4 4.7 4.6  CL 108 107 101  CO2 _0 GLUCOSE 103* 120* 101*  BUN _1 CREATININE 0.81 0.98 0.82  CALCIUM 9.2 9.4 8.9  GFRNONAA >60 58* >60  PROT 6.8 6.6 6.8  ALBUMIN 4.2 4.4 4.7  AST _2 ALT _3 ALKPHOS 114 112 118  BILITOT 0.7 1.1 1.2    RADIOGRAPHIC STUDIES: I have personally reviewed the radiological images as listed and agreed with the findings in the report. No results found.  ASSESSMENT & PLAN:   CLL (chronic lymphocytic leukemia) ([place]) #CLL-Rai stage II; FISH 13 q. Deletion-poor tolerance to Ibrutinib-hematuria; ; and Acalburtinib [stopped in March 2022 because of diarrhea].    #From a clinical standpoint patient's CLL is PROGRESSING [although patient's symptoms-of fatigue are quite subjective];; ALSO patient's white count is rising-White count is  195.  Currently-hemoglobin 12.5  platelets slightly low at 90-120s.   Continue surveillance given patient's preference [see below]  #Secondary immune deficiency [secondary to CLL; [FEB 2023IgG- 184]-]-multiple infections including recent pneumonia [Feb 2023-[facility]]-s/p IVIG infusions 400 mg/kg q monthly x4.   NOV 2023- immunoglobulins- IgG- 230.    #COPD/history of pneumonia -bilateral Jan 2023 [QUITsmoking-April, 2023; [facility]]-right middle lobe lung atelectasis- status post evaluation with Dr.Gonzalez; May 2023-continued  Persistent complete collapse right middle lobe with opacification of the right middle lobe bronchus; with some improvement in the lower lobe.  Continue compliance with inhalers and follow-up with pulmonary.  STABLE. Awaiting CT scan- [declined by insurance]  # Skin lesions- ? SCC Left UE-s/p excision by Dr.Graham  #Ongoing fatigue multifactorial-underlying chronic respiratory failure; underlying CLL; possible depression/anxiety.  Reasonable to hold any treatment at this time.  If continued get worse would recommend treatment for CLL.  # Vaccination: s/p flu shot; covid/RSV  # DISPOSITION:  # week after christmas- IVIG infusion # Follow up in 2 months [wed/thursday] ;MD; labs- CBC/CMP;LDH; SAME DAY -IVIG [pt request]-Dr.B     All questions were answered. The patient knows to call the clinic with any problems, questions or concerns.   Cammie Sickle, MD 02/03/2022 7:36 AM

## 2022-01-29 ENCOUNTER — Encounter: Payer: Self-pay | Admitting: Dermatology

## 2022-02-01 LAB — IMMUNOGLOBULINS A/E/G/M, SERUM
IgA: 13 mg/dL — ABNORMAL LOW (ref 64–422)
IgE (Immunoglobulin E), Serum: <2 IU/mL — ABNORMAL LOW (ref 6–495)
IgG (Immunoglobin G), Serum: 283 mg/dL — ABNORMAL LOW (ref 586–1602)
IgM (Immunoglobulin M), Srm: <5 mg/dL — ABNORMAL LOW (ref 26–217)

## 2022-02-03 ENCOUNTER — Encounter: Payer: Self-pay | Admitting: Internal Medicine

## 2022-02-11 DIAGNOSIS — J479 Bronchiectasis, uncomplicated: Secondary | ICD-10-CM | POA: Diagnosis not present

## 2022-02-11 DIAGNOSIS — J449 Chronic obstructive pulmonary disease, unspecified: Secondary | ICD-10-CM | POA: Diagnosis not present

## 2022-03-03 ENCOUNTER — Inpatient Hospital Stay: Payer: Medicare HMO | Attending: Internal Medicine

## 2022-03-03 VITALS — BP 119/59 | HR 72 | Temp 97.9°F | Resp 16 | Wt 148.4 lb

## 2022-03-03 DIAGNOSIS — C9110 Chronic lymphocytic leukemia of B-cell type not having achieved remission: Secondary | ICD-10-CM

## 2022-03-03 DIAGNOSIS — D849 Immunodeficiency, unspecified: Secondary | ICD-10-CM | POA: Insufficient documentation

## 2022-03-03 MED ORDER — IMMUNE GLOBULIN (HUMAN) 5 GM/50ML IV SOLN
25.0000 g | Freq: Once | INTRAVENOUS | Status: AC
  Administered 2022-03-03: 25 g via INTRAVENOUS
  Filled 2022-03-03: qty 200

## 2022-03-03 MED ORDER — DEXTROSE 5 % IV SOLN
Freq: Once | INTRAVENOUS | Status: AC
  Filled 2022-03-03: qty 250

## 2022-03-03 MED ORDER — ACETAMINOPHEN 325 MG PO TABS
650.0000 mg | ORAL_TABLET | Freq: Once | ORAL | Status: AC
  Administered 2022-03-03: 650 mg via ORAL
  Filled 2022-03-03: qty 2

## 2022-03-03 MED ORDER — DIPHENHYDRAMINE HCL 25 MG PO CAPS
25.0000 mg | ORAL_CAPSULE | Freq: Once | ORAL | Status: AC
  Administered 2022-03-03: 25 mg via ORAL
  Filled 2022-03-03: qty 1

## 2022-03-14 DIAGNOSIS — J479 Bronchiectasis, uncomplicated: Secondary | ICD-10-CM | POA: Diagnosis not present

## 2022-03-14 DIAGNOSIS — J449 Chronic obstructive pulmonary disease, unspecified: Secondary | ICD-10-CM | POA: Diagnosis not present

## 2022-03-31 ENCOUNTER — Inpatient Hospital Stay: Payer: Medicare HMO

## 2022-03-31 ENCOUNTER — Encounter: Payer: Self-pay | Admitting: Internal Medicine

## 2022-03-31 ENCOUNTER — Inpatient Hospital Stay (HOSPITAL_BASED_OUTPATIENT_CLINIC_OR_DEPARTMENT_OTHER): Payer: Medicare HMO | Admitting: Internal Medicine

## 2022-03-31 ENCOUNTER — Inpatient Hospital Stay: Payer: Medicare HMO | Attending: Internal Medicine

## 2022-03-31 VITALS — BP 139/68 | HR 76 | Temp 97.3°F | Resp 18 | Wt 148.4 lb

## 2022-03-31 VITALS — BP 128/65 | HR 67 | Resp 16

## 2022-03-31 DIAGNOSIS — C9110 Chronic lymphocytic leukemia of B-cell type not having achieved remission: Secondary | ICD-10-CM

## 2022-03-31 DIAGNOSIS — D849 Immunodeficiency, unspecified: Secondary | ICD-10-CM | POA: Diagnosis present

## 2022-03-31 LAB — CBC WITH DIFFERENTIAL/PLATELET
Abs Immature Granulocytes: 0.19 10*3/uL — ABNORMAL HIGH (ref 0.00–0.07)
Basophils Absolute: 0.2 10*3/uL — ABNORMAL HIGH (ref 0.0–0.1)
Basophils Relative: 0 %
Eosinophils Absolute: 0.2 10*3/uL (ref 0.0–0.5)
Eosinophils Relative: 0 %
HCT: 37.8 % (ref 36.0–46.0)
Hemoglobin: 11.6 g/dL — ABNORMAL LOW (ref 12.0–15.0)
Immature Granulocytes: 0 %
Lymphocytes Relative: 96 %
Lymphs Abs: 220.3 10*3/uL — ABNORMAL HIGH (ref 0.7–4.0)
MCH: 32 pg (ref 26.0–34.0)
MCHC: 30.7 g/dL (ref 30.0–36.0)
MCV: 104.4 fL — ABNORMAL HIGH (ref 80.0–100.0)
Monocytes Absolute: 7.2 10*3/uL — ABNORMAL HIGH (ref 0.1–1.0)
Monocytes Relative: 3 %
Neutro Abs: 2 10*3/uL (ref 1.7–7.7)
Neutrophils Relative %: 1 %
Platelets: 105 10*3/uL — ABNORMAL LOW (ref 150–400)
RBC: 3.62 MIL/uL — ABNORMAL LOW (ref 3.87–5.11)
RDW: 14.2 % (ref 11.5–15.5)
Smear Review: NORMAL
WBC: 230 10*3/uL (ref 4.0–10.5)
nRBC: 0 % (ref 0.0–0.2)

## 2022-03-31 LAB — COMPREHENSIVE METABOLIC PANEL
ALT: 11 U/L (ref 0–44)
AST: 30 U/L (ref 15–41)
Albumin: 4.4 g/dL (ref 3.5–5.0)
Alkaline Phosphatase: 119 U/L (ref 38–126)
Anion gap: 8 (ref 5–15)
BUN: 14 mg/dL (ref 8–23)
CO2: 28 mmol/L (ref 22–32)
Calcium: 9.4 mg/dL (ref 8.9–10.3)
Chloride: 105 mmol/L (ref 98–111)
Creatinine, Ser: 0.92 mg/dL (ref 0.44–1.00)
GFR, Estimated: >60 mL/min (ref >60)
Glucose, Bld: 105 mg/dL — ABNORMAL HIGH (ref 70–99)
Potassium: 4.4 mmol/L (ref 3.5–5.1)
Sodium: 141 mmol/L (ref 135–145)
Total Bilirubin: 1.2 mg/dL (ref 0.3–1.2)
Total Protein: 6.8 g/dL (ref 6.5–8.1)

## 2022-03-31 LAB — LACTATE DEHYDROGENASE: LDH: 197 U/L — ABNORMAL HIGH (ref 98–192)

## 2022-03-31 MED ORDER — DIPHENHYDRAMINE HCL 25 MG PO CAPS
25.0000 mg | ORAL_CAPSULE | Freq: Once | ORAL | Status: AC
  Administered 2022-03-31: 25 mg via ORAL
  Filled 2022-03-31: qty 1

## 2022-03-31 MED ORDER — ACETAMINOPHEN 325 MG PO TABS
650.0000 mg | ORAL_TABLET | Freq: Once | ORAL | Status: AC
  Administered 2022-03-31: 650 mg via ORAL
  Filled 2022-03-31: qty 2

## 2022-03-31 MED ORDER — IMMUNE GLOBULIN (HUMAN) 5 GM/50ML IV SOLN
25.0000 g | Freq: Once | INTRAVENOUS | Status: AC
  Administered 2022-03-31: 25 g via INTRAVENOUS
  Filled 2022-03-31: qty 50

## 2022-03-31 MED ORDER — DEXTROSE 5 % IV SOLN
Freq: Once | INTRAVENOUS | Status: AC
  Filled 2022-03-31: qty 250

## 2022-03-31 NOTE — Assessment & Plan Note (Addendum)
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 Deletion-poor tolerance to Ibrutinib-hematuria; ; and Acalburtinib [stopped in March 2022 because of diarrhea].    #From a clinical standpoint patient's CLL is PROGRESSING [although patient's symptoms-of fatigue are quite subjective];; ALSO patient's white count is rising-White count is  230 Currently-hemoglobin 12.5 platelets slightly low at 105.  However continue surveillance given patient's preference [see below]-overall clinically stable.  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   NOV 2023- immunoglobulins- IgG- 230.  I reviewed importance of re-starting IVIG infusions to prevent infections.  Patient reluctantly agrees.  She prefers to every 2 months- proceed with #2 /3-4 infusion.     # ? Diarrhea x 2-3 days post sec to IVIG- improved after immodium. Recommend taking immodium sooner after 1-2 loose stools.   #COPD/history of pneumonia -bilateral Jan 2023 [QUITsmoking-April, 2023; [facility]]-right middle lobe lung atelectasis- status post evaluation with Dr.Gonzalez; May 2023-continued  Persistent complete collapse right middle lobe with opacification of the right middle lobe bronchus; with some improvement in the lower lobe.  Continue compliance with inhalers and follow-up with pulmonary.  Stable- defer to pulmonary for follow up.   #Ongoing fatigue multifactorial-underlying chronic respiratory failure; underlying CLL; possible depression/anxiety.  Reasonable to hold any treatment at this time.  If continued get worse would recommend treatment for CLL. Stable; see above.   # Vaccination: s/p flu shot; discussed regarding covid/RSV vaccinations.  # DISPOSITION:  # IVIG today-  # Follow up in 2 months [wed/thursday] ;MD; labs- CBC/CMP;LDH; SAME DAY -IVIG [pt request]-Dr.B

## 2022-03-31 NOTE — Progress Notes (Signed)
Fatigue, diarrhea are side effects for her. Her memory is foggy at times. Ambulatory. Appetite is good. Denies any dizziness. Denies fevers. Has hot flashes late afternoon and night time, increasing in frequency. Critical WBC's 230 this morning. Dr B notified and aware.

## 2022-03-31 NOTE — Progress Notes (Signed)
[facility] NOTE  Patient Care Team: Venita Lick, NP as PCP - General (Nurse Practitioner) Oneta Rack, MD (Dermatology) Manya Silvas, MD (Inactive) (Gastroenterology) Bary Castilla Forest Gleason, MD (General Surgery) Cammie Sickle, MD as Consulting Physician (Oncology) Tyler Pita, MD as Consulting Physician (Pulmonary Disease)  CHIEF COMPLAINTS/PURPOSE OF CONSULTATION:  CLL  #  Oncology History Overview Note  Katherine [name] is a 82 y.o. female with chronic lymphocytic leukemia (CLL).  WBC has ranged between 22,000 - 101,7000 since 05/2011.   She has received Rituxan x 2 four week cycles (06/27/2015 and 05/27/2017).  Hepatitis B surface antigen and hepatitis B surface antibody were negative on 07/04/2015.   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  CCND1, ATM, chromosome 12, and TP53 were normal.     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  There was a CD5 and CD23 positive monoclonal B cell population with lambda light chain restriction, negative for FMC7 and CD38, representing  88% of leukocytes, >5,000/uL. There was no loss of, or aberrant expression of, the pan T cell  antigens to  suggest a neoplastic T cell process. CD4:CD8 ratio 2.0  No circulating blasts were detected. There was no immunophenotypic evidence of abnormal myeloid maturation.  IGH/BCL2 by FISH is pending. --------------------------------------------------------  She began ibrutinib on 04/24/2019 (discontinued on 05/30/2019).                     She developed blood-streaked sputum (slight) then significant hematuria.                     She declined further ibrutinib. ---------------------------------------------------------------------------------            She began acalabrutinib on 03/14/2020 (held on 05/13/2020 secondary  to diarrhea). --------------------------------------------------------------------------------------   She has a 35 pack year smoking history.  She is in the low dose chest CT program.  Low dose chest CT on 02/02/2018 revealed a new endobronchial lesion in the segmental bronchus to the medial segment of the right middle lobe. This may simply reflect an area of retained secretions, however, the possibility of an endobronchial neoplasm should be considered. Lung-RADS 4AS, suspicious.  Low dose chest CT on 07/05/2018 revealed Lung-RADS 2, benign appearance or behavior.  Prior right middle lobe endobronchial lesion/mucous plugging was no longer visualized.  New mucous plugging in the right lower lobe.  #Secondary immune deficiency [secondary to CLL; [FEB 2023IgG- 184]-]-multiple infections pneumonia [Feb 2023-[facility]]-IVIG infusions 400 mg/kg q   CLL (chronic lymphocytic leukemia) ([place])  05/28/2015 Initial Diagnosis   CLL (chronic lymphocytic leukemia) ([facility])    HISTORY OF PRESENTING ILLNESS: Frail-appearing Caucasian female patient.  Accompanied by her daughter.  Katherine [name] 82 y.o.  female CLL-13 q. Deletion currently on surveillance because of intolerance/side effects to multiple TKIs is here for follow-up/s/p IVIG infusions is here for follow-up.  Patient denies any new infections or admission to hospital.  Admits to chronic shortness of breath.  Admits to chronic mild fatigue.  No significant weight loss.  No night sweats.  Patient admits to mild diarrhea after infusion.  However patient waits to take Imodium only after 4-5 loose stools.  Patient energy levels wax and wane.  However she is able to still able to continue ADLs; drives locally.   Chronic mild swelling in the legs.  No blood  in stools or black or stools.  Denies any new lumps or bumps.  Complains of easy bruising.  Review of Systems  Constitutional:  Positive for malaise/fatigue. Negative for chills, diaphoresis, fever and  weight loss.  HENT:  Negative for nosebleeds and sore throat.   Eyes:  Negative for double vision.  Respiratory:  Positive for hemoptysis and shortness of breath. Negative for cough, sputum production and wheezing.   Cardiovascular:  Negative for chest pain, palpitations, orthopnea and leg swelling.  Gastrointestinal:  Negative for abdominal pain, blood in stool, constipation, diarrhea, heartburn, melena, nausea and vomiting.  Genitourinary:  Negative for dysuria, frequency and urgency.  Musculoskeletal:  Positive for back pain. Negative for joint pain.  Skin: Negative.  Negative for itching and rash.  Neurological:  Negative for dizziness, tingling, focal weakness, weakness and headaches.  Endo/Heme/Allergies:  Does not bruise/bleed easily.  Psychiatric/Behavioral:  Negative for depression. The patient is not nervous/anxious and does not have insomnia.      MEDICAL HISTORY:  Past Medical History:  Diagnosis Date   Allergy    Anxiety    Depression    GERD (gastroesophageal reflux disease)    Hemorrhoids    Hypertension    Leukemia, lymphoid ([place])    CLL   Lobar pneumonia (H[facility])    Osteopenia    Personal history of chemotherapy     SURGICAL HISTORY: Past Surgical History:  Procedure Laterality Date   ABDOMINAL HYSTERECTOMY     APPENDECTOMY     BREAST BIOPSY Left    bx x 3-neg   COLON SURGERY     sigmoid resection   COLONOSCOPY     2007, 2012   COLONOSCOPY WITH PROPOFOL N/A 09/17/2015   Procedure: COLONOSCOPY WITH PROPOFOL;  Surgeon: Robert Bellow, MD;  Location: [facility] ENDOSCOPY;  Service: Endoscopy;  Laterality: N/A;   OOPHORECTOMY     SPINE SURGERY     L4-5    SOCIAL HISTORY: Social History   Socioeconomic History   Marital status: Married    Spouse name: Not on file   Number of children: Not on file   Years of education: 12   Highest education level: 12th grade  Occupational History   Not on file  Tobacco Use   Smoking status: Former    Packs/day: 1.00     Years: 35.00    Total pack years: 35.00    Types: Cigarettes    Quit date: 02/22/2021    Years since quitting: 1.1   Smokeless tobacco: Never  Vaping Use   Vaping Use: Never used  Substance and Sexual Activity   Alcohol use: No    Alcohol/week: 0.0 standard drinks of alcohol   Drug use: No   Sexual activity: Not on file  Other Topics Concern   Not on file  Social History Narrative   Not on file   Social Determinants of Health   Financial Resource Strain: Low Risk  (01/24/2017)   Overall Financial Resource Strain (CARDIA)    Difficulty of Paying Living Expenses: Not hard at all  Food Insecurity: No Food Insecurity (01/24/2017)   Hunger Vital Sign    Worried About Running Out of Food in the Last Year: Never true    Ran Out of Food in the Last Year: Never true  Transportation Needs: No Transportation Needs (01/24/2017)   PRAPARE - Hydrologist (Medical): No    Lack of Transportation (Non-Medical): No  Physical Activity: Inactive (01/24/2017)   Exercise Vital Sign  Days of Exercise per Week: 0 days    Minutes of Exercise per Session: 0 min  Stress: Stress Concern Present (02/16/2018)   [place]    Feeling of Stress : To some extent  Social Connections: Moderately Integrated (01/24/2017)   Social Connection and Isolation Panel [NHANES]    Frequency of Communication with Friends and Family: More than three times a week    Frequency of Social Gatherings with Friends and Family: Once a week    Attends Religious Services: More than 4 times per year    Active Member of [employer] or Organizations: No    Attends Archivist Meetings: Never    Marital Status: Married  Human resources officer Violence: Not At Risk (01/24/2017)   Humiliation, Afraid, Rape, and Kick questionnaire    Fear of Current or Ex-Partner: No    Emotionally Abused: No    Physically Abused: No    Sexually Abused: No     FAMILY HISTORY: Family History  Problem Relation Age of Onset   Osteoporosis Mother    Hypertension Father    Heart attack Father    Stroke Maternal Grandfather    Breast cancer Sister 37    ALLERGIES:  is allergic to augmentin [amoxicillin-pot clavulanate] and biaxin [clarithromycin].  MEDICATIONS:  Current Outpatient Medications  Medication Sig Dispense Refill   acetaminophen (TYLENOL) 325 MG tablet Take by mouth.     albuterol (VENTOLIN HFA) 108 (90 Base) MCG/ACT inhaler Inhale 2 puffs into the lungs every 6 (six) hours as needed for wheezing or shortness of breath. 8 g 2   atorvastatin (LIPITOR) 10 MG tablet TAKE 1 TABLET BY MOUTH EVERY DAY 90 tablet 3   benazepril (LOTENSIN) 40 MG tablet TAKE 1 TABLET BY MOUTH EVERY DAY 90 tablet 0   Budeson-Glycopyrrol-Formoterol (BREZTRI AEROSPHERE) 160-9-4.8 MCG/ACT AERO Inhale 2 puffs into the lungs in the morning and at bedtime. 10.7 g 11   hydrocortisone (ANUSOL-HC) 25 MG suppository Place 1 suppository (25 mg total) rectally 2 (two) times daily. 12 suppository 12   LORazepam (ATIVAN) 1 MG tablet Take 0.5 tablets (0.5 mg total) by mouth 2 (two) times daily as needed for anxiety. 60 tablet 4   mometasone (NASONEX) 50 MCG/ACT nasal spray USE 1 SPRAY TO EACH NOSTRIL TWICE A DAY 51 each 1   Multiple Vitamins-Minerals (MULTIVITAMIN WITH MINERALS) tablet Take 1 tablet by mouth daily.     PARoxetine (PAXIL) 30 MG tablet TAKE 1 TABLET BY MOUTH EVERY DAY 90 tablet 1   Spacer/Aero-Holding Chambers (OPTICHAMBER DIAMOND) MISC 1 EACH BY OTHER ROUTE ONCE FOR 1 DOSE.     vitamin C (ASCORBIC ACID) 250 MG tablet Take 250 mg by mouth daily.     vitamin E 1000 UNIT capsule Take by mouth.     No current facility-administered medications for this visit.      Marland Kitchen  PHYSICAL EXAMINATION: ECOG PERFORMANCE STATUS: 1 - Symptomatic but completely ambulatory  Vitals:   03/31/22 0828  BP: 139/68  Pulse: 76  Resp: 18  Temp: (!) 97.3 F (36.3 C)  SpO2:  95%   Filed Weights   03/31/22 0828  Weight: 148 lb 6.4 oz (67.3 kg)    Physical Exam HENT:     Head: Normocephalic and atraumatic.     Mouth/Throat:     Pharynx: No oropharyngeal exudate.  Eyes:     Pupils: Pupils are equal, round, and reactive to light.  Cardiovascular:     Rate  and Rhythm: Normal rate and regular rhythm.  Pulmonary:     Effort: No respiratory distress.     Breath sounds: No wheezing.     Comments: Decreased air entry bilaterally at the bases. Abdominal:     General: Bowel sounds are normal. There is no distension.     Palpations: Abdomen is soft. There is no mass.     Tenderness: There is no abdominal tenderness. There is no guarding or rebound.  Musculoskeletal:        General: No tenderness. Normal range of motion.     Cervical back: Normal range of motion and neck supple.  Skin:    General: Skin is warm.  Neurological:     Mental Status: She is alert and oriented to person, place, and time.  Psychiatric:        Mood and Affect: Affect normal.      LABORATORY DATA:  I have reviewed the data as listed Lab Results  Component Value Date   WBC 230.0 (HH) 03/31/2022   HGB 11.6 (L) 03/31/2022   HCT 37.8 03/31/2022   MCV 104.4 (H) 03/31/2022   PLT 105 (L) 03/31/2022   Recent Labs    12/30/21 1052 01/28/22 0949 03/31/22 0806  NA 138 142 141  K 4.7 4.6 4.4  CL 107 101 105  CO2 '25 28 28  '$ GLUCOSE 120* 101* 105*  BUN '16 12 14  '$ CREATININE 0.98 0.82 0.92  CALCIUM 9.4 8.9 9.4  GFRNONAA 58* >60 >60  PROT 6.6 6.8 6.8  ALBUMIN 4.4 4.7 4.4  AST '25 24 30  '$ ALT '12 13 11  '$ ALKPHOS 112 118 119  BILITOT 1.1 1.2 1.2    RADIOGRAPHIC STUDIES: I have personally reviewed the radiological images as listed and agreed with the findings in the report. No results found.  ASSESSMENT & PLAN:   CLL (chronic lymphocytic leukemia) ([place]) #CLL-Rai stage II; FISH 13 q. Deletion-poor tolerance to Ibrutinib-hematuria; ; and Acalburtinib [stopped in March 2022  because of diarrhea].    #From a clinical standpoint patient's CLL is PROGRESSING [although patient's symptoms-of fatigue are quite subjective];; ALSO patient's white count is rising-White count is  230 Currently-hemoglobin 12.5 platelets slightly low at 105.  However continue surveillance given patient's preference [see below]-overall clinically stable.  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   NOV 2023- immunoglobulins- IgG- 230.  I reviewed importance of re-starting IVIG infusions to prevent infections.  Patient reluctantly agrees.  She prefers to every 2 months- proceed with #2 /3-4 infusion.     # ? Diarrhea x 2-3 days post sec to IVIG- improved after immodium. Recommend taking immodium sooner after 1-2 loose stools.   #COPD/history of pneumonia -bilateral Jan 2023 [QUITsmoking-April, 2023; [facility]]-right middle lobe lung atelectasis- status post evaluation with Dr.Gonzalez; May 2023-continued  Persistent complete collapse right middle lobe with opacification of the right middle lobe bronchus; with some improvement in the lower lobe.  Continue compliance with inhalers and follow-up with pulmonary.  Stable- defer to pulmonary for follow up.   #Ongoing fatigue multifactorial-underlying chronic respiratory failure; underlying CLL; possible depression/anxiety.  Reasonable to hold any treatment at this time.  If continued get worse would recommend treatment for CLL. Stable; see above.   # Vaccination: s/p flu shot; discussed regarding covid/RSV vaccinations.  # DISPOSITION:  # IVIG today-  # Follow up in 2 months [wed/thursday] ;MD; labs- CBC/CMP;LDH; SAME DAY -IVIG [pt  request]-Dr.B     All questions were answered. The patient knows to call the clinic with any problems, questions or concerns.   Cammie Sickle, MD 03/31/2022 9:18 AM

## 2022-04-09 ENCOUNTER — Other Ambulatory Visit: Payer: Self-pay | Admitting: Pulmonary Disease

## 2022-04-14 DIAGNOSIS — J449 Chronic obstructive pulmonary disease, unspecified: Secondary | ICD-10-CM | POA: Diagnosis not present

## 2022-04-14 DIAGNOSIS — J479 Bronchiectasis, uncomplicated: Secondary | ICD-10-CM | POA: Diagnosis not present

## 2022-05-05 ENCOUNTER — Ambulatory Visit: Payer: Medicare HMO | Admitting: Internal Medicine

## 2022-05-05 ENCOUNTER — Other Ambulatory Visit: Payer: Medicare HMO

## 2022-05-05 ENCOUNTER — Ambulatory Visit: Payer: Medicare HMO

## 2022-05-13 DIAGNOSIS — J479 Bronchiectasis, uncomplicated: Secondary | ICD-10-CM | POA: Diagnosis not present

## 2022-05-13 DIAGNOSIS — J449 Chronic obstructive pulmonary disease, unspecified: Secondary | ICD-10-CM | POA: Diagnosis not present

## 2022-05-26 ENCOUNTER — Other Ambulatory Visit: Payer: Self-pay | Admitting: Nurse Practitioner

## 2022-05-26 DIAGNOSIS — I10 Essential (primary) hypertension: Secondary | ICD-10-CM

## 2022-05-26 NOTE — Telephone Encounter (Signed)
Requested Prescriptions  Pending Prescriptions Disp Refills   benazepril (LOTENSIN) 40 MG tablet [Pharmacy Med Name: BENAZEPRIL HCL 40 MG TABLET] 90 tablet 0    Sig: TAKE 1 TABLET BY MOUTH EVERY DAY     Cardiovascular:  ACE Inhibitors Passed - 05/26/2022  2:25 AM      Passed - Cr in normal range and within 180 days    Creatinine  Date Value Ref Range Status  05/13/2021 32.1 20.0 - 300.0 mg/dL Final  08/10/2013 0.95 0.60 - 1.30 mg/dL Final   Creatinine, Ser  Date Value Ref Range Status  03/31/2022 0.92 0.44 - 1.00 mg/dL Final         Passed - K in normal range and within 180 days    Potassium  Date Value Ref Range Status  03/31/2022 4.4 3.5 - 5.1 mmol/L Final  08/10/2013 4.1 3.5 - 5.1 mmol/L Final         Passed - Patient is not pregnant      Passed - Last BP in normal range    BP Readings from Last 1 Encounters:  03/31/22 128/65         Passed - Valid encounter within last 6 months    Recent Outpatient Visits           5 months ago CLL (chronic lymphocytic leukemia) ([place])   [place], [place] T, NP   9 months ago CLL (chronic lymphocytic leukemia) ([place])   [place], Henrine Screws T, NP   1 year ago Centrilobular emphysema ([place])   [place], Henrine Screws T, NP   1 year ago Community acquired pneumonia of right middle lobe of lung   [place], [place] T, NP   1 year ago CLL (chronic lymphocytic leukemia) ([place])   [place], Barbaraann Faster, NP       Future Appointments             In 2 weeks [place], Barbaraann Faster, NP [place], [facility]

## 2022-05-31 ENCOUNTER — Inpatient Hospital Stay: Payer: Medicare HMO

## 2022-05-31 ENCOUNTER — Inpatient Hospital Stay (HOSPITAL_BASED_OUTPATIENT_CLINIC_OR_DEPARTMENT_OTHER): Payer: Medicare HMO | Admitting: Internal Medicine

## 2022-05-31 ENCOUNTER — Inpatient Hospital Stay: Payer: Medicare HMO | Attending: Internal Medicine

## 2022-05-31 VITALS — BP 132/71 | HR 72 | Temp 97.8°F | Resp 16

## 2022-05-31 VITALS — BP 122/69 | HR 71 | Temp 97.5°F | Resp 16 | Ht 63.0 in | Wt 149.6 lb

## 2022-05-31 DIAGNOSIS — D849 Immunodeficiency, unspecified: Secondary | ICD-10-CM | POA: Insufficient documentation

## 2022-05-31 DIAGNOSIS — C9110 Chronic lymphocytic leukemia of B-cell type not having achieved remission: Secondary | ICD-10-CM

## 2022-05-31 LAB — CBC WITH DIFFERENTIAL/PLATELET
Abs Immature Granulocytes: 0.17 10*3/uL — ABNORMAL HIGH (ref 0.00–0.07)
Basophils Absolute: 2.8 10*3/uL — ABNORMAL HIGH (ref 0.0–0.1)
Basophils Relative: 1 %
Eosinophils Absolute: 0.3 10*3/uL (ref 0.0–0.5)
Eosinophils Relative: 0 %
HCT: 36.8 % (ref 36.0–46.0)
Hemoglobin: 11.2 g/dL — ABNORMAL LOW (ref 12.0–15.0)
Immature Granulocytes: 0 %
Lymphocytes Relative: 97 %
Lymphs Abs: 230.6 10*3/uL — ABNORMAL HIGH (ref 0.7–4.0)
MCH: 32 pg (ref 26.0–34.0)
MCHC: 30.4 g/dL (ref 30.0–36.0)
MCV: 105.1 fL — ABNORMAL HIGH (ref 80.0–100.0)
Monocytes Absolute: 4.1 10*3/uL — ABNORMAL HIGH (ref 0.1–1.0)
Monocytes Relative: 2 %
Neutro Abs: 0 10*3/uL — CL (ref 1.7–7.7)
Neutrophils Relative %: 0 %
Platelets: 98 10*3/uL — ABNORMAL LOW (ref 150–400)
RBC: 3.5 MIL/uL — ABNORMAL LOW (ref 3.87–5.11)
RDW: 14.1 % (ref 11.5–15.5)
Smear Review: NORMAL
WBC: 237.9 10*3/uL (ref 4.0–10.5)
nRBC: 0 % (ref 0.0–0.2)

## 2022-05-31 LAB — COMPREHENSIVE METABOLIC PANEL
ALT: 11 U/L (ref 0–44)
AST: 28 U/L (ref 15–41)
Albumin: 4.3 g/dL (ref 3.5–5.0)
Alkaline Phosphatase: 134 U/L — ABNORMAL HIGH (ref 38–126)
Anion gap: 6 (ref 5–15)
BUN: 14 mg/dL (ref 8–23)
CO2: 27 mmol/L (ref 22–32)
Calcium: 9.1 mg/dL (ref 8.9–10.3)
Chloride: 106 mmol/L (ref 98–111)
Creatinine, Ser: 0.88 mg/dL (ref 0.44–1.00)
GFR, Estimated: >60 mL/min (ref >60)
Glucose, Bld: 106 mg/dL — ABNORMAL HIGH (ref 70–99)
Potassium: 4.5 mmol/L (ref 3.5–5.1)
Sodium: 139 mmol/L (ref 135–145)
Total Bilirubin: 0.8 mg/dL (ref 0.3–1.2)
Total Protein: 6.9 g/dL (ref 6.5–8.1)

## 2022-05-31 LAB — LACTATE DEHYDROGENASE: LDH: 217 U/L — ABNORMAL HIGH (ref 98–192)

## 2022-05-31 MED ORDER — DIPHENHYDRAMINE HCL 25 MG PO CAPS
25.0000 mg | ORAL_CAPSULE | Freq: Once | ORAL | Status: AC
  Administered 2022-05-31: 25 mg via ORAL
  Filled 2022-05-31: qty 1

## 2022-05-31 MED ORDER — DIPHENOXYLATE-ATROPINE 2.5-0.025 MG PO TABS: 1.0000 | ORAL_TABLET | Freq: Four times a day (QID) | ORAL | 1 refills | Status: DC | PRN

## 2022-05-31 MED ORDER — ACETAMINOPHEN 325 MG PO TABS
650.0000 mg | ORAL_TABLET | Freq: Once | ORAL | Status: AC
  Administered 2022-05-31: 650 mg via ORAL
  Filled 2022-05-31: qty 2

## 2022-05-31 MED ORDER — IMMUNE GLOBULIN (HUMAN) 10 GM/100ML IV SOLN
400.0000 mg/kg | Freq: Once | INTRAVENOUS | Status: AC
  Administered 2022-05-31: 25 g via INTRAVENOUS
  Filled 2022-05-31: qty 250

## 2022-05-31 MED ORDER — DEXTROSE 5 % IV SOLN
Freq: Once | INTRAVENOUS | Status: AC
  Filled 2022-05-31: qty 250

## 2022-05-31 NOTE — Assessment & Plan Note (Signed)
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 Deletion-poor tolerance to Ibrutinib-hematuria; ; and Acalburtinib [stopped in March 2022 because of diarrhea].    #From a clinical standpoint patient's CLL is PROGRESSING [although patient's symptoms-of fatigue are quite subjective]; ALSO patient's white count is rising-White count is  237; also hemoglobin 11.2 platelets slightly low at 90s  However continue surveillance given patient's preference [see below]-overall clinically STABLE. Monitor for now.   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   DEC 2023- immunoglobulins- IgG- 283  I reviewed importance of maintenance IVIG infusions to prevent infections.  Patient reluctantly agrees.  She prefers to every 2 months- proceed with #3 /3-4 infusion.     # ? Diarrhea x 2-3 days post sec to IVIG- for almost 1-2 months.  improved after immodium. Consider adding lomotil prn.   #COPD/history of pneumonia -bilateral Jan 2023 [QUITsmoking-April, 2023; [facility]]-right middle lobe lung atelectasis- status post evaluation with Dr.Gonzalez; May 2023-continued  Persistent complete collapse right middle lobe with opacification of the right middle lobe bronchus; with some improvement in the lower lobe.  Defer to pulmonary. For follow up.   #Ongoing fatigue multifactorial-underlying chronic respiratory failure; underlying CLL; possible depression/anxiety.  Reasonable to hold any treatment at this time.  If continued get worse would recommend treatment for CLL. Over all stable.   # Vaccination: s/p flu shot; discussed regarding covid/RSV vaccinations.? Pneumonia vaccination- defer to PCP.   # DISPOSITION:  # IVIG today-  # Follow up in 2 months [wed/thursday] ;MD; possible IVIG; 2-3 days prior- labs- CBC/CMP;LDH; quantitative immunoglobulins-Dr.B

## 2022-05-31 NOTE — Progress Notes (Signed)
Infusion medication causing diarrhea and her urine smell strong.

## 2022-05-31 NOTE — Progress Notes (Signed)
[name] CONSULT NOTE  Patient Care Team: Marjie Skiff, NP as PCP - General (Nurse Practitioner) Debbrah Alar, MD (Dermatology) Scot Jun, MD (Inactive) (Gastroenterology) Lemar Livings Merrily Pew, MD (General Surgery) Earna Coder, MD as Consulting Physician (Oncology) Salena Saner, MD as Consulting Physician (Pulmonary Disease)  CHIEF COMPLAINTS/PURPOSE OF CONSULTATION:  CLL  #  Oncology History Overview Note  Katherine [name] is a 82 y.o. female with chronic lymphocytic leukemia (CLL).  WBC has ranged between 22,000 - 101,7000 since 05/2011.   She has received Rituxan x 2 four week cycles (06/27/2015 and 05/27/2017).  Hepatitis B surface antigen and hepatitis B surface antibody were negative on 07/04/2015.   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  CCND1, ATM, chromosome 12, and TP53 were normal.     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  There was a CD5 and CD23 positive monoclonal B cell population with lambda light chain restriction, negative for FMC7 and CD38, representing  88% of leukocytes, >5,000/uL. There was no loss of, or aberrant expression of, the pan T cell  antigens to  suggest a neoplastic T cell process. CD4:CD8 ratio 2.0  No circulating blasts were detected. There was no immunophenotypic evidence of abnormal myeloid maturation.  IGH/BCL2 by FISH is pending. --------------------------------------------------------  She began ibrutinib on 04/24/2019 (discontinued on 05/30/2019).                     She developed blood-streaked sputum (slight) then significant hematuria.                     She declined further ibrutinib. ---------------------------------------------------------------------------------            She began acalabrutinib on 03/14/2020 (held on 05/13/2020 secondary  to diarrhea). --------------------------------------------------------------------------------------   She has a 35 pack year smoking history.  She is in the low dose chest CT program.  Low dose chest CT on 02/02/2018 revealed a new endobronchial lesion in the segmental bronchus to the medial segment of the right middle lobe. This may simply reflect an area of retained secretions, however, the possibility of an endobronchial neoplasm should be considered. Lung-RADS 4AS, suspicious.  Low dose chest CT on 07/05/2018 revealed Lung-RADS 2, benign appearance or behavior.  Prior right middle lobe endobronchial lesion/mucous plugging was no longer visualized.  New mucous plugging in the right lower lobe.  #Secondary immune deficiency [secondary to CLL; [FEB 2023IgG- 184]-]-multiple infections pneumonia [Feb 2023-[facility]]-IVIG infusions 400 mg/kg q   CLL (chronic lymphocytic leukemia)  05/28/2015 Initial Diagnosis   CLL (chronic lymphocytic leukemia) (HCC)    HISTORY OF PRESENTING ILLNESS: Frail-appearing Caucasian female patient.  Accompanied by her daughter.  Katherine [name] 82 y.o.  female CLL-13 q. Deletion currently on surveillance because of intolerance/side effects to multiple TKIs is here for follow-up/s/p IVIG infusions is here for follow-up.  Patient complains of IVIG Infusion medication causing diarrhea.  Multiple loose stools 3-4 loose stools a day patient intake morning.  Her diarrhea is just getting better after 2 months or so.  Patient denies any new infections or admission to hospital.  Admits to chronic shortness of breath.  Admits to chronic mild fatigue.  No significant weight loss.  No night sweats.  However she is able to still able to continue ADLs; drives locally.  Chronic mild swelling in the  legs.  No blood in stools or black or stools.  Denies any new lumps or bumps.  Complains of easy bruising.  Review of Systems  Constitutional:  Positive for malaise/fatigue. Negative for  chills, diaphoresis, fever and weight loss.  HENT:  Negative for nosebleeds and sore throat.   Eyes:  Negative for double vision.  Respiratory:  Positive for hemoptysis and shortness of breath. Negative for cough, sputum production and wheezing.   Cardiovascular:  Negative for chest pain, palpitations, orthopnea and leg swelling.  Gastrointestinal:  Negative for abdominal pain, blood in stool, constipation, diarrhea, heartburn, melena, nausea and vomiting.  Genitourinary:  Negative for dysuria, frequency and urgency.  Musculoskeletal:  Positive for back pain. Negative for joint pain.  Skin: Negative.  Negative for itching and rash.  Neurological:  Negative for dizziness, tingling, focal weakness, weakness and headaches.  Endo/Heme/Allergies:  Does not bruise/bleed easily.  Psychiatric/Behavioral:  Negative for depression. The patient is not nervous/anxious and does not have insomnia.      MEDICAL HISTORY:  Past Medical History:  Diagnosis Date   Allergy    Anxiety    Depression    GERD (gastroesophageal reflux disease)    Hemorrhoids    Hypertension    Leukemia, lymphoid (HCC)    CLL   Lobar pneumonia (HCC)    Osteopenia    Personal history of chemotherapy     SURGICAL HISTORY: Past Surgical History:  Procedure Laterality Date   ABDOMINAL HYSTERECTOMY     APPENDECTOMY     BREAST BIOPSY Left    bx x 3-neg   COLON SURGERY     sigmoid resection   COLONOSCOPY     2007, 2012   COLONOSCOPY WITH PROPOFOL N/A 09/17/2015   Procedure: COLONOSCOPY WITH PROPOFOL;  Surgeon: Earline Mayotte, MD;  Location: [facility] ENDOSCOPY;  Service: Endoscopy;  Laterality: N/A;   OOPHORECTOMY     SPINE SURGERY     L4-5    SOCIAL HISTORY: Social History   Socioeconomic History   Marital status: Married    Spouse name: Not on file   Number of children: Not on file   Years of education: 12   Highest education level: 12th grade  Occupational History   Not on file  Tobacco Use   Smoking  status: Former    Packs/day: 1.00    Years: 35.00    Additional pack years: 0.00    Total pack years: 35.00    Types: Cigarettes    Quit date: 02/22/2021    Years since quitting: 1.2   Smokeless tobacco: Never  Vaping Use   Vaping Use: Never used  Substance and Sexual Activity   Alcohol use: No    Alcohol/week: 0.0 standard drinks of alcohol   Drug use: No   Sexual activity: Not on file  Other Topics Concern   Not on file  Social History Narrative   Not on file   Social Determinants of Health   Financial Resource Strain: Low Risk  (01/24/2017)   Overall Financial Resource Strain (CARDIA)    Difficulty of Paying Living Expenses: Not hard at all  Food Insecurity: No Food Insecurity (01/24/2017)   Hunger Vital Sign    Worried About Running Out of Food in the Last Year: Never true    Ran Out of Food in the Last Year: Never true  Transportation Needs: No Transportation Needs (01/24/2017)   PRAPARE - Administrator, Civil Service (Medical): No    Lack of Transportation (Non-Medical):  No  Physical Activity: Inactive (01/24/2017)   Exercise Vital Sign    Days of Exercise per Week: 0 days    Minutes of Exercise per Session: 0 min  Stress: Stress Concern Present (02/16/2018)   [employer] - Occupational Stress Questionnaire    Feeling of Stress : To some extent  Social Connections: Moderately Integrated (01/24/2017)   Social Connection and Isolation Panel [NHANES]    Frequency of Communication with Friends and Family: More than three times a week    Frequency of Social Gatherings with Friends and Family: Once a week    Attends Religious Services: More than 4 times per year    Active Member of [employer]ubs or Organizations: No    Attends BankerClub or Organization Meetings: Never    Marital Status: Married  Catering managerntimate Partner Violence: Not At Risk (01/24/2017)   Humiliation, Afraid, Rape, and Kick questionnaire    Fear of Current or Ex-Partner: No     Emotionally Abused: No    Physically Abused: No    Sexually Abused: No    FAMILY HISTORY: Family History  Problem Relation Age of Onset   Osteoporosis Mother    Hypertension Father    Heart attack Father    Stroke Maternal Grandfather    Breast cancer Sister [id number]    ALLERGIES:  is allergic to augmentin [amoxicillin-pot clavulanate] and biaxin [clarithromycin].  MEDICATIONS:  Current Outpatient Medications  Medication Sig Dispense Refill   acetaminophen (TYLENOL) 325 MG tablet Take by mouth.     albuterol (VENTOLIN HFA) 108 (90 Base) MCG/ACT inhaler Inhale 2 puffs into the lungs every 6 (six) hours as needed for wheezing or shortness of breath. 8 g 2   atorvastatin (LIPITOR) 10 MG tablet TAKE 1 TABLET BY MOUTH EVERY DAY 90 tablet 3   benazepril (LOTENSIN) 40 MG tablet TAKE 1 TABLET BY MOUTH EVERY DAY 90 tablet 0   Budeson-Glycopyrrol-Formoterol (BREZTRI AEROSPHERE) 160-9-4.8 MCG/ACT AERO Inhale 2 puffs into the lungs in the morning and at bedtime. 10.7 g 11   diphenoxylate-atropine (LOMOTIL) 2.5-0.025 MG tablet Take 1 tablet by mouth 4 (four) times daily as needed for diarrhea or loose stools. Take it along with immodium 60 tablet 1   hydrocortisone (ANUSOL-HC) 25 MG suppository Place 1 suppository (25 mg total) rectally 2 (two) times daily. 12 suppository 12   LORazepam (ATIVAN) 1 MG tablet Take 0.5 tablets (0.5 mg total) by mouth 2 (two) times daily as needed for anxiety. 60 tablet 4   mometasone (NASONEX) 50 MCG/ACT nasal spray USE 1 SPRAY TO EACH NOSTRIL TWICE A DAY 51 each 1   Multiple Vitamins-Minerals (MULTIVITAMIN WITH MINERALS) tablet Take 1 tablet by mouth daily.     PARoxetine (PAXIL) 30 MG tablet TAKE 1 TABLET BY MOUTH EVERY DAY 90 tablet 1   Spacer/Aero-Holding Chambers (OPTICHAMBER DIAMOND) MISC 1 EACH BY OTHER ROUTE ONCE FOR 1 DOSE.     vitamin C (ASCORBIC ACID) 250 MG tablet Take 250 mg by mouth daily.     vitamin E 1000 UNIT capsule Take by mouth.     ABRYSVO 120  MCG/0.5ML injection Inject 0.5 mLs into the muscle once. (Patient not taking: Reported on 05/31/2022)     SPIKEVAX injection Inject 0.5 mLs into the muscle once. (Patient not taking: Reported on 05/31/2022)     No current facility-administered medications for this visit.   Facility-Administered Medications Ordered in Other Visits  Medication Dose Route Frequency Provider Last Rate Last Admin   Immune Globulin 10% (  PRIVIGEN) IV infusion 25 g  400 mg/kg Intravenous Once Louretta Shorten R, MD          .  PHYSICAL EXAMINATION: ECOG PERFORMANCE STATUS: 1 - Symptomatic but completely ambulatory  Vitals:   05/31/22 0827  BP: 122/69  Pulse: 71  Resp: 16  Temp: (!) 97.5 F (36.4 C)  SpO2: 95%   Filed Weights   05/31/22 0827  Weight: 149 lb 9.6 oz (67.9 kg)    Physical Exam HENT:     Head: Normocephalic and atraumatic.     Mouth/Throat:     Pharynx: No oropharyngeal exudate.  Eyes:     Pupils: Pupils are equal, round, and reactive to light.  Cardiovascular:     Rate and Rhythm: Normal rate and regular rhythm.  Pulmonary:     Effort: No respiratory distress.     Breath sounds: No wheezing.     Comments: Decreased air entry bilaterally at the bases. Abdominal:     General: Bowel sounds are normal. There is no distension.     Palpations: Abdomen is soft. There is no mass.     Tenderness: There is no abdominal tenderness. There is no guarding or rebound.  Musculoskeletal:        General: No tenderness. Normal range of motion.     Cervical back: Normal range of motion and neck supple.  Skin:    General: Skin is warm.  Neurological:     Mental Status: She is alert and oriented to person, place, and time.  Psychiatric:        Mood and Affect: Affect normal.      LABORATORY DATA:  I have reviewed the data as listed Lab Results  Component Value Date   WBC 237.9 (HH) 05/31/2022   HGB 11.2 (L) 05/31/2022   HCT 36.8 05/31/2022   MCV 105.1 (H) 05/31/2022   PLT 98 (L)  05/31/2022   Recent Labs    01/28/22 0949 03/31/22 0806 05/31/22 0807  NA 142 141 139  K 4.6 4.4 4.5  CL 101 105 106  CO2 28 28 27   GLUCOSE 101* 105* 106*  BUN 12 14 14   CREATININE 0.82 0.92 1.61  CALCIUM 8.9 9.4 9.1  GFRNONAA >60 >60 >60  PROT 6.8 6.8 6.9  ALBUMIN 4.7 4.4 4.3  AST 24 30 28   ALT 13 11 11   ALKPHOS 118 119 134*  BILITOT 1.2 1.2 0.8    RADIOGRAPHIC STUDIES: I have personally reviewed the radiological images as listed and agreed with the findings in the report. No results found.  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 Deletion-poor tolerance to Ibrutinib-hematuria; ; and Acalburtinib [stopped in March 2022 because of diarrhea].    #From a clinical standpoint patient's CLL is PROGRESSING [although patient's symptoms-of fatigue are quite subjective]; ALSO patient's white count is rising-White count is  237; also hemoglobin 11.2 platelets slightly low at 90s  However continue surveillance given patient's preference [see below]-overall clinically STABLE. Monitor for now.   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   DEC 2023- immunoglobulins- IgG- 283  I reviewed importance of maintenance IVIG infusions to prevent infections.  Patient reluctantly agrees.  She prefers to every 2 months- proceed with #3 /3-4 infusion.     # ? Diarrhea x 2-3 days post sec to IVIG- for almost 1-2 months.  improved after immodium.  Consider adding lomotil prn.   #COPD/history of pneumonia -bilateral Jan 2023 [QUITsmoking-April, 2023; [facility]]-right middle lobe lung atelectasis- status post evaluation with Dr.Gonzalez; May 2023-continued  Persistent complete collapse right middle lobe with opacification of the right middle lobe bronchus; with some improvement in the lower lobe.  Defer to pulmonary. For follow up.    #Ongoing fatigue multifactorial-underlying chronic respiratory failure; underlying CLL; possible depression/anxiety.  Reasonable to hold any treatment at this time.  If continued get worse would recommend treatment for CLL. Over all stable.   # Vaccination: s/p flu shot; discussed regarding covid/RSV vaccinations.? Pneumonia vaccination- defer to PCP.   # DISPOSITION:  # IVIG today-  # Follow up in 2 months [wed/thursday] ;MD; possible IVIG; 2-3 days prior- labs- CBC/CMP;LDH; quantitative immunoglobulins-Dr.B     All questions were answered. The patient knows to call the clinic with any problems, questions or concerns.   Earna Coder, MD 05/31/2022 9:53 AM

## 2022-06-01 ENCOUNTER — Telehealth: Payer: Self-pay | Admitting: Nurse Practitioner

## 2022-06-01 NOTE — Telephone Encounter (Signed)
Copied from CRM [id number]. Topic: General - Other >> Jun 01, 2022  9:03 AM [name] wrote: Reason for CRM: Pt is wanting her PCP to call her, regarding her medications. Pt is wanting to know if any of her medications has changed. Please have PCP give pt a call back.

## 2022-06-01 NOTE — Telephone Encounter (Signed)
Spoke with patient and informed her that we would be going over all her medications when she comes to her appointment on 4/18 and to either bring all her bottles of medications or a list of her medications that she is currently taking. Patient verbalized understanding

## 2022-06-10 ENCOUNTER — Encounter: Payer: Self-pay | Admitting: Nurse Practitioner

## 2022-06-10 ENCOUNTER — Ambulatory Visit (INDEPENDENT_AMBULATORY_CARE_PROVIDER_SITE_OTHER): Payer: Medicare HMO | Admitting: Nurse Practitioner

## 2022-06-10 VITALS — BP 115/67 | HR 77 | Temp 97.9°F | Ht 62.99 in | Wt 149.1 lb

## 2022-06-10 DIAGNOSIS — F339 Major depressive disorder, recurrent, unspecified: Secondary | ICD-10-CM | POA: Diagnosis not present

## 2022-06-10 DIAGNOSIS — D696 Thrombocytopenia, unspecified: Secondary | ICD-10-CM

## 2022-06-10 DIAGNOSIS — F419 Anxiety disorder, unspecified: Secondary | ICD-10-CM

## 2022-06-10 DIAGNOSIS — I10 Essential (primary) hypertension: Secondary | ICD-10-CM | POA: Diagnosis not present

## 2022-06-10 DIAGNOSIS — C9110 Chronic lymphocytic leukemia of B-cell type not having achieved remission: Secondary | ICD-10-CM

## 2022-06-10 DIAGNOSIS — J432 Centrilobular emphysema: Secondary | ICD-10-CM | POA: Diagnosis not present

## 2022-06-10 DIAGNOSIS — R7301 Impaired fasting glucose: Secondary | ICD-10-CM | POA: Diagnosis not present

## 2022-06-10 DIAGNOSIS — Z79899 Other long term (current) drug therapy: Secondary | ICD-10-CM | POA: Diagnosis not present

## 2022-06-10 DIAGNOSIS — I700 Atherosclerosis of aorta: Secondary | ICD-10-CM | POA: Diagnosis not present

## 2022-06-10 DIAGNOSIS — E7800 Pure hypercholesterolemia, unspecified: Secondary | ICD-10-CM

## 2022-06-10 DIAGNOSIS — F17210 Nicotine dependence, cigarettes, uncomplicated: Secondary | ICD-10-CM

## 2022-06-10 DIAGNOSIS — D692 Other nonthrombocytopenic purpura: Secondary | ICD-10-CM | POA: Diagnosis not present

## 2022-06-10 DIAGNOSIS — Z1231 Encounter for screening mammogram for malignant neoplasm of breast: Secondary | ICD-10-CM

## 2022-06-10 DIAGNOSIS — Z0000 Encounter for general adult medical examination without abnormal findings: Secondary | ICD-10-CM

## 2022-06-10 DIAGNOSIS — R69 Illness, unspecified: Secondary | ICD-10-CM | POA: Diagnosis not present

## 2022-06-10 MED ORDER — LORAZEPAM 1 MG PO TABS
0.5000 mg | ORAL_TABLET | Freq: Two times a day (BID) | ORAL | 4 refills | Status: DC | PRN
Start: 2022-06-14 — End: 2022-12-10

## 2022-06-10 MED ORDER — PAROXETINE HCL 30 MG PO TABS: 30.0000 mg | ORAL_TABLET | Freq: Every day | ORAL | 4 refills | Status: DC

## 2022-06-10 MED ORDER — BENAZEPRIL HCL 40 MG PO TABS: 40.0000 mg | ORAL_TABLET | Freq: Every day | ORAL | 4 refills | Status: DC

## 2022-06-10 NOTE — Patient Instructions (Signed)
Please call to schedule your mammogram and/or bone density: [facility]  Address: [address], [place], [place] [address] Phone: [phone]  [facility] [place] [address] [place],  [place]  [address] Phone: [phone]   

## 2022-06-10 NOTE — Assessment & Plan Note (Signed)
Chronic.  Noted on exam, recommend gentle skin care and monitoring for wounds, if wounds present immediately alert provider.

## 2022-06-10 NOTE — Assessment & Plan Note (Signed)
Chronic, under treatment with oncology.  Recent note and labs reviewed.  Continue collaboration with oncology provider.  Discussed goals of care with patient. 

## 2022-06-10 NOTE — Assessment & Plan Note (Signed)
Chronic, stable.  BP at goal today.  Continue current medication regimen and adjust as needed.  Recommend she monitor BP at least a few mornings a week at home and document.  DASH diet at home.  LABS: TSH.  Could consider change to ARB in future due to her underlying COPD, has been on ACE for several years.

## 2022-06-10 NOTE — Assessment & Plan Note (Addendum)
Long term, patient aware of risks with long term use.  UDS updated today (due next around 06/10/23) and contract on file.  Return in 6 months, has multiple specialty visits with CLL -- will span follow-up out.

## 2022-06-10 NOTE — Assessment & Plan Note (Signed)
Chronic, ongoing.  Continue current medication regimen and adjust as needed. Lipid panel today. 

## 2022-06-10 NOTE — Assessment & Plan Note (Signed)
Quit 15 months ago.  Recommend continued cessation.

## 2022-06-10 NOTE — Assessment & Plan Note (Signed)
With underlying CLL, followed by oncology.  Continue this collaboration. 

## 2022-06-10 NOTE — Assessment & Plan Note (Signed)
Chronic, stable.  Continue current medication regimen as ordered by pulmonary + continue use of InCourage vest.   Recommend she avoid use of Benadryl due to age >6 and BEERS criteria, utilize Claritin instead.  Currently stable with pulmonary regimen.  If cost becomes concern will place referral to CCM team.

## 2022-06-10 NOTE — Progress Notes (Addendum)
BP 115/67   Pulse 77   Temp 97.9 F (36.6 C) (Oral)   Ht 5' 2.99" (1.6 m)   Wt 149 lb 1.6 oz (67.6 kg)   SpO2 95%   BMI 26.42 kg/m    Subjective:    Patient ID: Katherine [name], female    DOB: [birth year], 82 y.o.   MRN: [id number]  HPI: Katherine [name] is a 82 y.o. female presenting on 06/10/2022 for Medicare Wellness and follow-up visit. Current medical complaints include:none  She currently lives with: husband Menopausal Symptoms: no  HYPERLIPIDEMIA Continues on Atorvastatin 10 MG.   Hyperlipidemia status: good compliance Satisfied with current treatment?  yes Side effects:  no Medication compliance: good compliance Supplements: none Aspirin:  no The ASCVD Risk score (Arnett DK, et al., 2019) failed to calculate for the following reasons:   The 2019 ASCVD risk score is only valid for ages 30 to 71 Chest pain:  no Coronary artery disease:  no Family history CAD:  yes Family history early CAD:  no   COPD Follows with pulmonary, last 01/07/22.  Underlying emphysema and aortic atherosclerosis noted on past imaging.  Was started on Breztri by pulmonary which she finds beneficial.  Currently paying $47 a month for this.  Is using vest as well, which has helped a lot she reports -- uses twice a day.  Not a current smoker, has not smoked in 15 months.  Has underlying CLL and is followed closely by oncology with last visit 05/31/22 -- taking infusions at this time for treatment, last on 05/31/22. She reports these cause fatigue and diarrhea.  COPD status: stable Satisfied with current treatment?: yes Oxygen use: no Dyspnea frequency: no Cough frequency: no Rescue inhaler frequency: occasional Limitation of activity: no Productive cough: none Last Spirometry: with pulmonary Pneumovax: Up to Date Influenza: Up to Date  ANXIETY/STRESS Continues on Paxil 30 MG daily + Ativan 0.5 MG BID PRN -- often takes twice a day.  Pt is aware of risks of benzo medication use to  include increased sedation, respiratory suppression, falls, dependence and cardiovascular events.  Pt would like to continue treatment as benefit determined to outweigh risk.  PDMP review last filled 05/14/22.  She endorses some anxious mood on occasion, getting aggravated at times not being able to do what she used to do.  UDS last on 05/13/21. Duration:controlled Anxious mood: yes Excessive worrying: yes Irritability: no  Sweating: no Nausea: no Palpitations:no Hyperventilation: no Panic attacks: no Agoraphobia: no  Obscessions/compulsions: no Depressed mood: no    06/10/2022   11:51 AM 12/09/2021    1:11 PM 08/17/2021    9:46 AM 05/13/2021    9:47 AM 03/20/2021    2:37 PM  Depression screen PHQ 2/9  Decreased Interest 0 0 0 0 0  Down, Depressed, Hopeless 0 0 0 0 0  PHQ - 2 Score 0 0 0 0 0  Altered sleeping 0 0 0 0 0  Tired, decreased energy 0 0 1 0 0  Change in appetite 0 0 0 0 0  Feeling bad or failure about yourself  0 0 0 0 0  Trouble concentrating 0 0 0 0 0  Moving slowly or fidgety/restless 0 0 0 0 0  Suicidal thoughts 0 0 0 0 0  PHQ-9 Score 0 0 1 0 0  Difficult doing work/chores  Not difficult at all Not difficult at all         06/10/2022   11:51 AM 12/09/2021  1:10 PM 05/13/2021    9:48 AM 03/20/2021    2:38 PM  GAD 7 : Generalized Anxiety Score  Nervous, Anxious, on Edge 1 1 0 0  Control/stop worrying 1 1 0 0  Worry too much - different things 0 1 0 0  Trouble relaxing 1 1 0 0  Restless 0  0 0  Easily annoyed or irritable 0 1 0 0  Afraid - awful might happen 0  0 0  Total GAD 7 Score 3  0 0  Anxiety Difficulty Not difficult at all Not difficult at all Not difficult at all Not difficult at all        12/09/2021    1:11 PM 12/30/2021   11:04 AM 01/28/2022   10:00 AM 03/03/2022    8:06 AM 06/10/2022   11:14 AM  Fall Risk  Falls in the past year? 0    0  Was there an injury with Fall? 0    0  Fall Risk Category Calculator 0    0  Fall Risk Category  (Retired) Low      (RETIRED) Patient Fall Risk Level Low fall risk Low fall risk Low fall risk Low fall risk   Patient at Risk for Falls Due to No Fall Risks    No Fall Risks  Fall risk Follow up Falls evaluation completed    Education provided    Functional Status Survey: Is the patient deaf or have difficulty hearing?: No Does the patient have difficulty seeing, even when wearing glasses/contacts?: No Does the patient have difficulty concentrating, remembering, or making decisions?: No Does the patient have difficulty walking or climbing stairs?: No Does the patient have difficulty dressing or bathing?: No Does the patient have difficulty doing errands alone such as visiting a doctor's office or shopping?: No    Past Medical History:  Past Medical History:  Diagnosis Date   Allergy    Anxiety    Depression    GERD (gastroesophageal reflux disease)    Hemorrhoids    Hypertension    Leukemia, lymphoid    CLL   Lobar pneumonia    Osteopenia    Personal history of chemotherapy     Surgical History:  Past Surgical History:  Procedure Laterality Date   ABDOMINAL HYSTERECTOMY     APPENDECTOMY     BREAST BIOPSY Left    bx x 3-neg   COLON SURGERY     sigmoid resection   COLONOSCOPY     2007, 2012   COLONOSCOPY WITH PROPOFOL N/A 09/17/2015   Procedure: COLONOSCOPY WITH PROPOFOL;  Surgeon: Earline Mayotte, MD;  Location: [facility] ENDOSCOPY;  Service: Endoscopy;  Laterality: N/A;   OOPHORECTOMY     SPINE SURGERY     L4-5    Medications:  Current Outpatient Medications on File Prior to Visit  Medication Sig   acetaminophen (TYLENOL) 325 MG tablet Take by mouth.   albuterol (VENTOLIN HFA) 108 (90 Base) MCG/ACT inhaler Inhale 2 puffs into the lungs every 6 (six) hours as needed for wheezing or shortness of breath.   atorvastatin (LIPITOR) 10 MG tablet TAKE 1 TABLET BY MOUTH EVERY DAY   benazepril (LOTENSIN) 40 MG tablet TAKE 1 TABLET BY MOUTH EVERY DAY    Budeson-Glycopyrrol-Formoterol (BREZTRI AEROSPHERE) 160-9-4.8 MCG/ACT AERO Inhale 2 puffs into the lungs in the morning and at bedtime.   diphenoxylate-atropine (LOMOTIL) 2.5-0.025 MG tablet Take 1 tablet by mouth 4 (four) times daily as needed for diarrhea or loose stools. Take it  along with immodium   hydrocortisone (ANUSOL-HC) 25 MG suppository Place 1 suppository (25 mg total) rectally 2 (two) times daily.   LORazepam (ATIVAN) 1 MG tablet Take 0.5 tablets (0.5 mg total) by mouth 2 (two) times daily as needed for anxiety.   mometasone (NASONEX) 50 MCG/ACT nasal spray USE 1 SPRAY TO EACH NOSTRIL TWICE A DAY   Multiple Vitamins-Minerals (MULTIVITAMIN WITH MINERALS) tablet Take 1 tablet by mouth daily.   PARoxetine (PAXIL) 30 MG tablet TAKE 1 TABLET BY MOUTH EVERY DAY   Spacer/Aero-Holding Chambers (OPTICHAMBER DIAMOND) MISC 1 EACH BY OTHER ROUTE ONCE FOR 1 DOSE.   vitamin C (ASCORBIC ACID) 250 MG tablet Take 250 mg by mouth daily.   vitamin E 1000 UNIT capsule Take by mouth.   No current facility-administered medications on file prior to visit.    Allergies:  Allergies  Allergen Reactions   Augmentin [Amoxicillin-Pot Clavulanate] Swelling    tongue   Biaxin [Clarithromycin] Swelling and Rash    tongue    Social History:  Social History   Socioeconomic History   Marital status: Married    Spouse name: Not on file   Number of children: Not on file   Years of education: 12   Highest education level: 12th grade  Occupational History   Not on file  Tobacco Use   Smoking status: Former    Packs/day: 1.00    Years: 35.00    Additional pack years: 0.00    Total pack years: 35.00    Types: Cigarettes    Quit date: 02/22/2021    Years since quitting: 1.2   Smokeless tobacco: Never  Vaping Use   Vaping Use: Never used  Substance and Sexual Activity   Alcohol use: No    Alcohol/week: 0.0 standard drinks of alcohol   Drug use: No   Sexual activity: Not on file  Other Topics  Concern   Not on file  Social History Narrative   Not on file   Social Determinants of Health   Financial Resource Strain: Low Risk  (06/10/2022)   Overall Financial Resource Strain (CARDIA)    Difficulty of Paying Living Expenses: Not hard at all  Food Insecurity: No Food Insecurity (06/10/2022)   Hunger Vital Sign    Worried About Running Out of Food in the Last Year: Never true    Ran Out of Food in the Last Year: Never true  Transportation Needs: No Transportation Needs (06/10/2022)   PRAPARE - Administrator, Civil Service (Medical): No    Lack of Transportation (Non-Medical): No  Physical Activity: Insufficiently Active (06/10/2022)   Exercise Vital Sign    Days of Exercise per Week: 2 days    Minutes of Exercise per Session: 20 min  Stress: No Stress Concern Present (06/10/2022)   [employer] of Occupational Health - Occupational Stress Questionnaire    Feeling of Stress : Only a little  Social Connections: Moderately Integrated (06/10/2022)   Social Connection and Isolation Panel [NHANES]    Frequency of Communication with Friends and Family: More than three times a week    Frequency of Social Gatherings with Friends and Family: More than three times a week    Attends Religious Services: More than 4 times per year    Active Member of [employer] or Organizations: No    Attends Banker Meetings: Never    Marital Status: Married  Catering manager Violence: Not At Risk (06/10/2022)   Humiliation, Afraid, Rape, and Kick questionnaire  Fear of Current or Ex-Partner: No    Emotionally Abused: No    Physically Abused: No    Sexually Abused: No   Social History   Tobacco Use  Smoking Status Former   Packs/day: 1.00   Years: 35.00   Additional pack years: 0.00   Total pack years: 35.00   Types: Cigarettes   Quit date: 02/22/2021   Years since quitting: 1.2  Smokeless Tobacco Never   Social History   Substance and Sexual Activity  Alcohol Use No    Alcohol/week: 0.0 standard drinks of alcohol    Family History:  Family History  Problem Relation Age of Onset   Osteoporosis Mother    Hypertension Father    Heart attack Father    Stroke Maternal Grandfather    Breast cancer Sister 10    Past medical history, surgical history, medications, allergies, family history and social history reviewed with patient today and changes made to appropriate areas of the chart.   ROS All other ROS negative except what is listed above and in the HPI.      Objective:    BP 115/67   Pulse 77   Temp 97.9 F (36.6 C) (Oral)   Ht 5' 2.99" (1.6 m)   Wt 149 lb 1.6 oz (67.6 kg)   SpO2 95%   BMI 26.42 kg/m   Wt Readings from Last 3 Encounters:  06/10/22 149 lb 1.6 oz (67.6 kg)  05/31/22 149 lb 9.6 oz (67.9 kg)  03/31/22 148 lb 6.4 oz (67.3 kg)    Physical Exam Vitals and nursing note reviewed. Exam conducted with a chaperone present.  Constitutional:      General: She is awake. She is not in acute distress.    Appearance: She is well-developed and well-groomed. She is not ill-appearing or toxic-appearing.  HENT:     Head: Normocephalic and atraumatic.     Right Ear: Hearing, tympanic membrane, ear canal and external ear normal. No drainage.     Left Ear: Hearing, tympanic membrane, ear canal and external ear normal. No drainage.     Nose: Nose normal.     Right Sinus: No maxillary sinus tenderness or frontal sinus tenderness.     Left Sinus: No maxillary sinus tenderness or frontal sinus tenderness.     Mouth/Throat:     Mouth: Mucous membranes are moist.     Pharynx: Oropharynx is clear. Uvula midline. No pharyngeal swelling, oropharyngeal exudate or posterior oropharyngeal erythema.  Eyes:     General: Lids are normal.        Right eye: No discharge.        Left eye: No discharge.     Extraocular Movements: Extraocular movements intact.     Conjunctiva/sclera: Conjunctivae normal.     Pupils: Pupils are equal, round, and reactive  to light.     Visual Fields: Right eye visual fields normal and left eye visual fields normal.  Neck:     Thyroid: No thyromegaly.     Vascular: No carotid bruit.     Trachea: Trachea normal.  Cardiovascular:     Rate and Rhythm: Normal rate and regular rhythm.     Heart sounds: Normal heart sounds. No murmur heard.    No gallop.     Comments: Baseline edema BLE, ankles. Pulmonary:     Effort: Pulmonary effort is normal. No accessory muscle usage or respiratory distress.     Breath sounds: Normal breath sounds.  Chest:  Breasts:  Right: Normal.     Left: Normal.  Abdominal:     General: Bowel sounds are normal.     Palpations: Abdomen is soft. There is no hepatomegaly or splenomegaly.     Tenderness: There is no abdominal tenderness.  Musculoskeletal:        General: Normal range of motion.     Cervical back: Normal range of motion and neck supple.     Right lower leg: 2+ Edema present.     Left lower leg: 2+ Edema present.  Lymphadenopathy:     Head:     Right side of head: No submental, submandibular, tonsillar, preauricular or posterior auricular adenopathy.     Left side of head: No submental, submandibular, tonsillar, preauricular or posterior auricular adenopathy.     Cervical: No cervical adenopathy.     Upper Body:     Right upper body: No supraclavicular, axillary or pectoral adenopathy.     Left upper body: No supraclavicular, axillary or pectoral adenopathy.  Skin:    General: Skin is warm and dry.     Capillary Refill: Capillary refill takes less than 2 seconds.     Findings: No rash.     Comments: Scattered bruises upper extremities.  Neurological:     Mental Status: She is alert and oriented to person, place, and time.     Gait: Gait is intact.     Deep Tendon Reflexes: Reflexes are normal and symmetric.     Reflex Scores:      Brachioradialis reflexes are 2+ on the right side and 2+ on the left side.      Patellar reflexes are 2+ on the right side and 2+  on the left side. Psychiatric:        Attention and Perception: Attention normal.        Mood and Affect: Mood normal.        Speech: Speech normal.        Behavior: Behavior normal. Behavior is cooperative.        Thought Content: Thought content normal.        Judgment: Judgment normal.       06/10/2022   11:19 AM 02/16/2018   10:49 AM 01/24/2017   11:19 AM  6CIT Screen  What Year? 0 points 0 points 0 points  What month? 0 points 0 points 0 points  What time? 0 points 0 points 0 points  Count back from 20 0 points 0 points 0 points  Months in reverse 0 points 0 points 0 points  Repeat phrase 0 points 2 points 4 points  Total Score 0 points 2 points 4 points   Results for orders placed or performed in visit on 05/31/22  Lactate dehydrogenase  Result Value Ref Range   LDH 217 (H) 98 - 192 U/L  Comprehensive metabolic panel  Result Value Ref Range   Sodium 139 135 - 145 mmol/L   Potassium 4.5 3.5 - 5.1 mmol/L   Chloride 106 98 - 111 mmol/L   CO2 27 22 - 32 mmol/L   Glucose, Bld 106 (H) 70 - 99 mg/dL   BUN 14 8 - 23 mg/dL   Creatinine, Ser 1.61 0.44 - 1.00 mg/dL   Calcium 9.1 8.9 - 09.6 mg/dL   Total Protein 6.9 6.5 - 8.1 g/dL   Albumin 4.3 3.5 - 5.0 g/dL   AST 28 15 - 41 U/L   ALT 11 0 - 44 U/L   Alkaline Phosphatase 134 (H) 38 -  126 U/L   Total Bilirubin 0.8 0.3 - 1.2 mg/dL   GFR, Estimated >40 >98 mL/min   Anion gap 6 5 - 15  CBC with Differential  Result Value Ref Range   WBC 237.9 (HH) 4.0 - 10.5 K/uL   RBC 3.50 (L) 3.87 - 5.11 MIL/uL   Hemoglobin 11.2 (L) 12.0 - 15.0 g/dL   HCT 11.9 14.7 - 82.9 %   MCV 105.1 (H) 80.0 - 100.0 fL   MCH 32.0 26.0 - 34.0 pg   MCHC 30.4 30.0 - 36.0 g/dL   RDW 56.2 13.0 - 86.5 %   Platelets 98 (L) 150 - 400 K/uL   nRBC 0.0 0.0 - 0.2 %   Neutrophils Relative % 0 %   Neutro Abs 0.0 (LL) 1.7 - 7.7 K/uL   Lymphocytes Relative 97 %   Lymphs Abs 230.6 (H) 0.7 - 4.0 K/uL   Monocytes Relative 2 %   Monocytes Absolute 4.1 (H) 0.1 -  1.0 K/uL   Eosinophils Relative 0 %   Eosinophils Absolute 0.3 0.0 - 0.5 K/uL   Basophils Relative 1 %   Basophils Absolute 2.8 (H) 0.0 - 0.1 K/uL   WBC Morphology ABSOLUTE LYMPHOCYTOSIS    RBC Morphology UNREMARKABLE    Smear Review Normal platelet morphology    Immature Granulocytes 0 %   Abs Immature Granulocytes 0.17 (H) 0.00 - 0.07 K/uL      Assessment & Plan:   Problem List Items Addressed This Visit       Cardiovascular and Mediastinum   Atherosclerosis of aorta    Chronic, ongoing.  Noted on imaging in past, recommend continued cessation of smoking and continue daily statin for prevention.      Essential hypertension    Chronic, stable.  BP at goal today.  Continue current medication regimen and adjust as needed.  Recommend she monitor BP at least a few mornings a week at home and document.  DASH diet at home.  LABS: TSH.  Could consider change to ARB in future due to her underlying COPD, has been on ACE for several years.         Relevant Orders   TSH   Senile purpura    Chronic.  Noted on exam, recommend gentle skin care and monitoring for wounds, if wounds present immediately alert provider.        Respiratory   Centrilobular emphysema    Chronic, stable.  Continue current medication regimen as ordered by pulmonary + continue use of InCourage vest.   Recommend she avoid use of Benadryl due to age >84 and BEERS criteria, utilize Claritin instead.  Currently stable with pulmonary regimen.  If cost becomes concern will place referral to CCM team.         Hematopoietic and Hemostatic   Thrombocytopenia    With underlying CLL, followed by oncology.  Continue this collaboration.        Other   Anxiety    Chronic, stable with no SI/HI.  Continue Paxil at this time and consider transition in future to Sertraline due to age >69 and anticholinergic effects with Paxil.  Continue Ativan as needed, patient aware of risks with long term use.  Refills sent in.  UDS  updated today (due next around 06/10/23) and contract on file.  Return in 6 months, has multiple specialty visits with CLL -- will span follow-up out.      Relevant Orders   [id number] 11+Oxyco+Alc+Crt-Bund   CLL (chronic lymphocytic leukemia)  Chronic, under treatment with oncology.  Recent note and labs reviewed.  Continue collaboration with oncology provider.  Discussed goals of care with patient.      Depression, recurrent    Chronic, stable with no SI/HI.  Continue Paxil at this time and consider transition in future to Sertraline due to age >59 and anticholinergic effects with Paxil.  Continue Ativan as needed, patient aware of risks with long term use.  Refills sent in.  UDS updated today (due next around 06/10/23) and contract on file.  Return in 6 months, has multiple specialty visits with CLL -- will span follow-up out.      Relevant Orders   [id number] 11+Oxyco+Alc+Crt-Bund   Hypercholesterolemia    Chronic, ongoing.  Continue current medication regimen and adjust as needed.  Lipid panel today.       Relevant Orders   Lipid Panel w/o Chol/HDL Ratio   Long-term current use of benzodiazepine    Long term, patient aware of risks with long term use.  UDS updated today (due next around 06/10/23) and contract on file.  Return in 6 months, has multiple specialty visits with CLL -- will span follow-up out.      Relevant Orders   [id number] 11+Oxyco+Alc+Crt-Bund   Nicotine dependence, cigarettes, uncomplicated    Quit 15 months ago.  Recommend continued cessation.      Other Visit Diagnoses     Medicare annual wellness visit, subsequent    -  Primary   Medicare wellness due and performed with patient in office today.   IFG (impaired fasting glucose)       Noted on labs, A1c today.   Relevant Orders   HgB A1c   Encounter for screening mammogram for malignant neoplasm of breast       Mammogram ordered and instructed on how to obtain.   Relevant Orders   MM 3D SCREENING MAMMOGRAM  BILATERAL BREAST        Follow up plan: Return in about 6 months (around 12/10/2022) for HLD, CLL, COPD, MOOD.   LABORATORY TESTING:  - Pap smear: not applicable  IMMUNIZATIONS:   - Tdap: Tetanus vaccination status reviewed: Td vaccination next visit - Influenza: Up to date - Pneumovax: Up to date - Prevnar: Up to date - COVID: Up to date - HPV: Not applicable - Shingrix vaccine: Up to date  SCREENING: -Mammogram: Ordered today  - Colonoscopy: Not applicable  - Bone Density: Up to date  -Hearing Test: Not applicable  -Spirometry: Not applicable   PATIENT COUNSELING:   Advised to take 1 mg of folate supplement per day if capable of pregnancy.   Sexuality: Discussed sexually transmitted diseases, partner selection, use of condoms, avoidance of unintended pregnancy  and contraceptive alternatives.   Advised to avoid cigarette smoking.  I discussed with the patient that most people either abstain from alcohol or drink within safe limits (<=14/week and <=4 drinks/occasion for males, <=7/weeks and <= 3 drinks/occasion for females) and that the risk for alcohol disorders and other health effects rises proportionally with the number of drinks per week and how often a drinker exceeds daily limits.  Discussed cessation/primary prevention of drug use and availability of treatment for abuse.   Diet: Encouraged to adjust caloric intake to maintain  or achieve ideal body weight, to reduce intake of dietary saturated fat and total fat, to limit sodium intake by avoiding high sodium foods and not adding table salt, and to maintain adequate dietary potassium and calcium preferably from fresh fruits,  vegetables, and low-fat dairy products.    Stressed the importance of regular exercise  Injury prevention: Discussed safety belts, safety helmets, smoke detector, smoking near bedding or upholstery.   Dental health: Discussed importance of regular tooth brushing, flossing, and dental visits.     NEXT PREVENTATIVE PHYSICAL DUE IN 1 YEAR. Return in about 6 months (around 12/10/2022) for HLD, CLL, COPD, MOOD.

## 2022-06-10 NOTE — Addendum Note (Signed)
Addended by: Aura Dials T on: 06/10/2022 12:06 PM   Modules accepted: Orders

## 2022-06-10 NOTE — Assessment & Plan Note (Signed)
Chronic, stable with no SI/HI.  Continue Paxil at this time and consider transition in future to Sertraline due to age >65 and anticholinergic effects with Paxil.  Continue Ativan as needed, patient aware of risks with long term use.  Refills sent in.  UDS updated today (due next around 06/10/23) and contract on file.  Return in 6 months, has multiple specialty visits with CLL -- will span follow-up out. 

## 2022-06-10 NOTE — Assessment & Plan Note (Signed)
Chronic, ongoing.  Noted on imaging in past, recommend continued cessation of smoking and continue daily statin for prevention.

## 2022-06-10 NOTE — Assessment & Plan Note (Signed)
Chronic, stable with no SI/HI.  Continue Paxil at this time and consider transition in future to Sertraline due to age >27 and anticholinergic effects with Paxil.  Continue Ativan as needed, patient aware of risks with long term use.  Refills sent in.  UDS updated today (due next around 06/10/23) and contract on file.  Return in 6 months, has multiple specialty visits with CLL -- will span follow-up out.

## 2022-06-11 LAB — DRUG SCREEN 764883 11+OXYCO+ALC+CRT-BUND
Amphetamines, Urine: NEGATIVE ng/mL
BENZODIAZ UR QL: NEGATIVE ng/mL
Barbiturate: NEGATIVE ng/mL
Cannabinoid Quant, Ur: NEGATIVE ng/mL
Cocaine (Metabolite): NEGATIVE ng/mL
Creatinine: 241.6 mg/dL (ref 20.0–300.0)
Ethanol: NEGATIVE %
Meperidine: NEGATIVE ng/mL
Methadone Screen, Urine: NEGATIVE ng/mL
OPIATE SCREEN URINE: NEGATIVE ng/mL
Oxycodone/Oxymorphone, Urine: NEGATIVE ng/mL
Phencyclidine: NEGATIVE ng/mL
Propoxyphene: NEGATIVE ng/mL
Tramadol: NEGATIVE ng/mL
pH, Urine: 5.9 (ref 4.5–8.9)

## 2022-06-11 LAB — LIPID PANEL W/O CHOL/HDL RATIO
Cholesterol, Total: 123 mg/dL (ref 100–199)
HDL: 29 mg/dL — ABNORMAL LOW (ref >39)
LDL Chol Calc (NIH): 70 mg/dL (ref 0–99)
Triglycerides: 131 mg/dL (ref 0–149)
VLDL Cholesterol Cal: 24 mg/dL (ref 5–40)

## 2022-06-11 LAB — HEMOGLOBIN A1C
Est. average glucose Bld gHb Est-mCnc: 120 mg/dL
Hgb A1c MFr Bld: 5.8 % — ABNORMAL HIGH (ref 4.8–5.6)

## 2022-06-11 LAB — TSH: TSH: 4.3 u[IU]/mL (ref 0.450–4.500)

## 2022-06-11 NOTE — Progress Notes (Signed)
Contacted via MyChart   Good afternoon Katherine [name], your labs have returned: - Cholesterol levels at goal, continue medication. - Thyroid testing normal. - The A1c is the diabetes testing we talked about, this looks at your blood sugars over the past 3 months and turns the average into a number.  Your number is 5.8%, meaning you are prediabetic.  Any number 5.7 to 6.4 is considered prediabetes and any number 6.5 or greater is considered diabetes.   I would recommend heavy focus on decreasing foods high in sugar and your intake of things like bread products, pasta, and rice.  The American Diabetes Association online has a large amount of information on diet changes to make.  We will recheck this number in 6 months to ensure you are not continuing to trend upwards and move into diabetes.  Any questions? Keep being amazing!!  Thank you for allowing me to participate in your care.  I appreciate you. Kindest regards, Lloyd Cullinan

## 2022-06-29 ENCOUNTER — Telehealth: Payer: Self-pay | Admitting: *Deleted

## 2022-06-29 NOTE — Telephone Encounter (Signed)
Left vm for patient stating that she has already had the previous 2 pneumonia vaccines and does not need to get the new P20

## 2022-06-29 NOTE — Telephone Encounter (Signed)
Pt given lab results per notes of J. Cannady, NP from 06/11/22 on 06/29/22. Pt verbalized understanding.  Good afternoon Marylu Lund, your labs have returned: - Cholesterol levels at goal, continue medication. - Thyroid testing normal. - The A1c is the diabetes testing we talked about, this looks at your blood sugars over the past 3 months and turns the average into a number.  Your number is 5.8%, meaning you are prediabetic.  Any number 5.7 to 6.4 is considered prediabetes and any number 6.5 or greater is considered diabetes.   I would recommend heavy focus on decreasing foods high in sugar and your intake of things like bread products, pasta, and rice.  The American Diabetes Association online has a large amount of information on diet changes to make.  We will recheck this number in 6 months to ensure you are not continuing to trend upwards and move into diabetes.  Any questions?    Keep being amazing!!  Thank you for allowing me to participate in your care.  I appreciate you. Kindest regards, Jolene  Patient requesting if she should get a pneumonia shot from [facility] ? Please advise. Patient would like a call back.

## 2022-07-09 ENCOUNTER — Ambulatory Visit
Admission: EM | Admit: 2022-07-09 | Discharge: 2022-07-09 | Disposition: A | Discharge status: Home / Self Care | Payer: Medicare HMO | Attending: Family Medicine | Admitting: Family Medicine

## 2022-07-09 DIAGNOSIS — S59912A Unspecified injury of left forearm, initial encounter: Secondary | ICD-10-CM | POA: Diagnosis not present

## 2022-07-09 DIAGNOSIS — S51012A Laceration without foreign body of left elbow, initial encounter: Secondary | ICD-10-CM | POA: Diagnosis not present

## 2022-07-09 MED ORDER — DOXYCYCLINE HYCLATE 100 MG PO CAPS: 100.0000 mg | ORAL_CAPSULE | Freq: Two times a day (BID) | ORAL | 0 refills | Status: DC

## 2022-07-09 NOTE — ED Provider Notes (Signed)
[facility]    CSN: [id number] Arrival date & time: 07/09/22  1334      History   Chief Complaint Chief Complaint  Patient presents with   Abrasion    HPI Katherine [name] is a 82 y.o. female.   HPI  Katherine [name] presents for left forearm abrasion that occurred while doing laundry. She carried the basket a little to the left and scraped her arm on the  door around 1 PM today.  No other injuries or concerns.    Past Medical History:  Diagnosis Date   Allergy    Anxiety    Depression    GERD (gastroesophageal reflux disease)    Hemorrhoids    Hypertension    Leukemia, lymphoid (HCC)    CLL   Lobar pneumonia (HCC)    Osteopenia    Personal history of chemotherapy     Patient Active Problem List   Diagnosis Date Noted   Controlled substance agreement signed 12/09/2021   Type O blood, Rh negative 05/09/2021   Long-term current use of benzodiazepine 02/05/2020   Splenomegaly 02/02/2020   Thrombocytopenia (HCC) 02/02/2020   Allergic rhinitis 07/18/2018   Goals of care, counseling/discussion 07/05/2018   Nicotine dependence, cigarettes, uncomplicated 05/01/2018   Centrilobular emphysema (HCC) 02/05/2018   Advanced care planning/counseling discussion 01/09/2018   Anxiety 01/04/2017   Hypercholesterolemia 01/08/2016   Atherosclerosis of aorta (HCC) 01/07/2016   History of colonic polyps 08/14/2015   CLL (chronic lymphocytic leukemia) (HCC) 05/28/2015   Senile purpura (HCC) 01/06/2015   Essential hypertension 01/06/2015   Depression, recurrent (HCC) 01/06/2015    Past Surgical History:  Procedure Laterality Date   ABDOMINAL HYSTERECTOMY     APPENDECTOMY     BREAST BIOPSY Left    bx x 3-neg   COLON SURGERY     sigmoid resection   COLONOSCOPY     2007, 2012   COLONOSCOPY WITH PROPOFOL N/A 09/17/2015   Procedure: COLONOSCOPY WITH PROPOFOL;  Surgeon: Earline Mayotte, MD;  Location: [facility];  Service: Endoscopy;  Laterality: N/A;   OOPHORECTOMY      SPINE SURGERY     L4-5    OB History   No obstetric history on file.      Home Medications    Prior to Admission medications   Medication Sig Start Date End Date Taking? Authorizing Provider  acetaminophen (TYLENOL) 325 MG tablet Take by mouth. 03/13/21  Yes [provider]  albuterol (VENTOLIN HFA) 108 (90 Base) MCG/ACT inhaler Inhale 2 puffs into the lungs every 6 (six) hours as needed for wheezing or shortness of breath. 07/30/21  Yes Salena Saner, MD  atorvastatin (LIPITOR) 10 MG tablet TAKE 1 TABLET BY MOUTH EVERY DAY 12/15/21  Yes Cannady, Jolene T, NP  benazepril (LOTENSIN) 40 MG tablet Take 1 tablet (40 mg total) by mouth daily. 06/10/22  Yes Cannady, Jolene T, NP  Budeson-Glycopyrrol-Formoterol (BREZTRI AEROSPHERE) 160-9-4.8 MCG/ACT AERO Inhale 2 puffs into the lungs in the morning and at bedtime. 07/21/21  Yes Salena Saner, MD  diphenoxylate-atropine (LOMOTIL) 2.5-0.025 MG tablet Take 1 tablet by mouth 4 (four) times daily as needed for diarrhea or loose stools. Take it along with immodium 05/31/22  Yes Earna Coder, MD  hydrocortisone (ANUSOL-HC) 25 MG suppository Place 1 suppository (25 mg total) rectally 2 (two) times daily. 12/09/21  Yes Cannady, Jolene T, NP  LORazepam (ATIVAN) 1 MG tablet Take 0.5 tablets (0.5 mg total) by mouth 2 (two) times daily as needed  for anxiety. 06/14/22  Yes Cannady, Jolene T, NP  mometasone (NASONEX) 50 MCG/ACT nasal spray USE 1 SPRAY TO EACH NOSTRIL TWICE A DAY 04/09/22  Yes Salena Saner, MD  Multiple Vitamins-Minerals (MULTIVITAMIN WITH MINERALS) tablet Take 1 tablet by mouth daily.   Yes [provider]  PARoxetine (PAXIL) 30 MG tablet Take 1 tablet (30 mg total) by mouth daily. 06/10/22  Yes Cannady, Dorie Rank, NP  Spacer/Aero-Holding Chambers (OPTICHAMBER DIAMOND) MISC 1 EACH BY OTHER ROUTE ONCE FOR 1 DOSE. 07/08/20  Yes [provider]  vitamin C (ASCORBIC ACID) 250 MG tablet Take 250 mg by mouth  daily.   Yes [provider]  vitamin E 1000 UNIT capsule Take by mouth.   Yes [provider]    Family History Family History  Problem Relation Age of Onset   Osteoporosis Mother    Hypertension Father    Heart attack Father    Stroke Maternal Grandfather    Breast cancer Sister 63    Social History Social History   Tobacco Use   Smoking status: Former    Packs/day: 1.00    Years: 35.00    Additional pack years: 0.00    Total pack years: 35.00    Types: Cigarettes    Quit date: 02/22/2021    Years since quitting: 1.4   Smokeless tobacco: Never  Vaping Use   Vaping Use: Never used  Substance Use Topics   Alcohol use: No    Alcohol/week: 0.0 standard drinks of alcohol   Drug use: No     Allergies   Augmentin [amoxicillin-pot clavulanate] and Biaxin [clarithromycin]   Review of Systems Review of Systems :negative unless otherwise stated in HPI.      Physical Exam Triage Vital Signs ED Triage Vitals  Enc Vitals Group     BP 07/09/22 1419 114/66     Pulse Rate 07/09/22 1419 86     Resp --      Temp 07/09/22 1419 98 F (36.7 C)     Temp src --      SpO2 07/09/22 1419 95 %     Weight --      Height --      Head Circumference --      Peak Flow --      Pain Score 07/09/22 1413 0     Pain Loc --      Pain Edu? --      Excl. in GC? --    No data found.  Updated Vital Signs BP 114/66 (BP Location: Left Arm)   Pulse 86   Temp 98 F (36.7 C)   SpO2 95%   Visual Acuity Right Eye Distance:   Left Eye Distance:   Bilateral Distance:    Right Eye Near:   Left Eye Near:    Bilateral Near:     Physical Exam  GEN: alert, well appearing elderly female, in no acute distress  EYES: no scleral injection, wearing glasses  CV: regular rate, strong radial pulse  RESP: no increased work of breathing MSK: pedal edema not new), no gross deformities NEURO: alert, moves all extremities appropriately, normal gait PSYCH: Normal affect,  appropriate speech and behavior  SKIN: warm and dry; large skin tear of  left forearm with hematoma      UC Treatments / Results  Labs (all labs ordered are listed, but only abnormal results are displayed) Labs Reviewed - No data to display  EKG   Radiology No results  found.  Procedures Laceration Repair  Date/Time: 07/23/2022 11:26 PM  Performed by: Katha Cabal, DO Authorized by: Katha Cabal, DO   Consent:    Consent obtained:  Verbal   Consent given by:  Patient   Risks discussed:  Infection, need for additional repair, pain and poor wound healing Universal protocol:    Patient identity confirmed:  Verbally with patient Anesthesia:    Anesthesia method:  None Laceration details:    Location:  Shoulder/arm   Shoulder/arm location:  L lower arm   Wound length (cm): 8 cm x 3 .5 cm. Exploration:    Hemostasis achieved with:  Direct pressure Treatment:    Area cleansed with:  Soap and water and chlorhexidine   Amount of cleaning:  Standard Skin repair:    Repair method:  Tissue adhesive Approximation:    Approximation:  Close Repair type:    Repair type:  Simple Post-procedure details:    Dressing:  Tube gauze   Procedure completion:  Tolerated  (including critical care time)  Medications Ordered in UC Medications - No data to display  Initial Impression / Assessment and Plan / UC Course  I have reviewed the triage vital signs and the nursing notes.  Pertinent labs & imaging results that were available during my care of the patient were reviewed by me and considered in my medical decision making (see chart for details).     Patient is a 82 y.o. femalewho presents for left forearm injury that occurred around 1 PM today..  Overall, patient is well-appearing and well-hydrated.  Vital signs stable.  Katherine [name] is afebrile.  Exam concerning for skin tear.  Discussed wound care and healing options and patient chose to have skin adhesive applied.  Dermabond  applied to reapproximated skin.  Given 7-day course of doxycycline.  Return precautions given and patient voiced understanding.   Reviewed expectations regarding course of current medical issues.  All questions asked were answered.  Outlined signs and symptoms indicating need for more acute intervention. Patient verbalized understanding. After Visit Summary given.   Final Clinical Impressions(s) / UC Diagnoses   Final diagnoses:  Forearm injury, left, initial encounter  Skin tear of left elbow without complication, initial encounter     Discharge Instructions      Take antibiotics as prescribed. Follow up next week with your primary care provider to have the wound inspected.      ED Prescriptions     Medication Sig Dispense Auth. Provider   doxycycline (VIBRAMYCIN) 100 MG capsule Take 1 capsule (100 mg total) by mouth 2 (two) times daily for 7 days. 14 capsule Tymier Lindholm, DO      PDMP not reviewed this encounter.              Katha Cabal, DO 07/23/22 2329

## 2022-07-09 NOTE — Discharge Instructions (Addendum)
Take antibiotics as prescribed. Follow up next week with your primary care provider to have the wound inspected.

## 2022-07-09 NOTE — ED Triage Notes (Signed)
Pt states that she was carrying a basket of laundry and scraped her arm against the door around 1pm.  Pt has an abrasion along the left forearm

## 2022-07-12 ENCOUNTER — Telehealth: Payer: Self-pay | Admitting: Nurse Practitioner

## 2022-07-12 NOTE — Telephone Encounter (Addendum)
Pt is calling and requesting to be worked in for an appointment to follow up on urgent care visit forearm injury, was seen on 07/09/2022 at [facility] in [place]. Pt mentioned having to keep her arm elevated and needing assistance with her bandages.   It was very difficult to understand her.   Please advise.

## 2022-07-12 NOTE — Telephone Encounter (Signed)
Noted  

## 2022-07-12 NOTE — Telephone Encounter (Signed)
Copied from CRM [id number]. Topic: Appointment Scheduling - Scheduling Inquiry for Clinic >> Jul 12, 2022  3:21 PM Marlow Baars wrote: Reason for CRM: The spouse of the patient called in stating they do not want to schedule an appt at this time. Will call back if there are any other issues

## 2022-07-12 NOTE — Telephone Encounter (Signed)
FYI

## 2022-07-12 NOTE — Telephone Encounter (Signed)
Called patient to schedule appt left detailed message for patient to call the office and schedule appt. CRM was put in for [facility]

## 2022-07-13 ENCOUNTER — Ambulatory Visit
Admission: EM | Admit: 2022-07-13 | Discharge: 2022-07-13 | Disposition: A | Discharge status: Home / Self Care | Payer: Medicare HMO | Attending: Physician Assistant | Admitting: Physician Assistant

## 2022-07-13 DIAGNOSIS — S51812A Laceration without foreign body of left forearm, initial encounter: Secondary | ICD-10-CM

## 2022-07-13 DIAGNOSIS — M79632 Pain in left forearm: Secondary | ICD-10-CM | POA: Diagnosis not present

## 2022-07-13 MED ORDER — DOXYCYCLINE HYCLATE 100 MG PO CAPS: 100.0000 mg | ORAL_CAPSULE | Freq: Two times a day (BID) | ORAL | 0 refills | Status: AC

## 2022-07-13 NOTE — ED Provider Notes (Signed)
[facility]    CSN: [id number] Arrival date & time: 07/13/22  1003      History   Chief Complaint Chief Complaint  Patient presents with   Arm Injury    HPI Katherine [name] is a 82 y.o. female presenting for left forearm pain due to injury that occurred 4 days ago.  She says she sustained a laceration while she was carrying a basket of laundry and scraped her arm against a door.  She did have skin adhesive applied to the laceration a few days ago.  She changed the bandage the following day and was told that she should be able to remove the bandage 2 days ago.  She apparently remove the bandage and it bled so she became concerned.  She says it is still very painful.  She reports she has full range of motion.  No numbness or weakness.  Does have some bruising now to the elbow and forearm.  Patient has been taking the antibiotics as prescribed at previous visit. She is reporting difficulty changing bandages and says it has been sticking to the wound.  Patient also says she only received 3 to 4 days of the doxycycline antibiotic and only has 3 pills left.  Her medical history significant for hypertension, CLL and osteopenia.  She also has history of thrombocytopenia.  Does not take any anticoagulants.  HPI  Past Medical History:  Diagnosis Date   Allergy    Anxiety    Depression    GERD (gastroesophageal reflux disease)    Hemorrhoids    Hypertension    Leukemia, lymphoid (HCC)    CLL   Lobar pneumonia (HCC)    Osteopenia    Personal history of chemotherapy     Patient Active Problem List   Diagnosis Date Noted   Controlled substance agreement signed 12/09/2021   Type O blood, Rh negative 05/09/2021   Long-term current use of benzodiazepine 02/05/2020   Splenomegaly 02/02/2020   Thrombocytopenia (HCC) 02/02/2020   Allergic rhinitis 07/18/2018   Goals of care, counseling/discussion 07/05/2018   Nicotine dependence, cigarettes, uncomplicated 05/01/2018    Centrilobular emphysema (HCC) 02/05/2018   Advanced care planning/counseling discussion 01/09/2018   Anxiety 01/04/2017   Hypercholesterolemia 01/08/2016   Atherosclerosis of aorta (HCC) 01/07/2016   History of colonic polyps 08/14/2015   CLL (chronic lymphocytic leukemia) (HCC) 05/28/2015   Senile purpura (HCC) 01/06/2015   Essential hypertension 01/06/2015   Depression, recurrent (HCC) 01/06/2015    Past Surgical History:  Procedure Laterality Date   ABDOMINAL HYSTERECTOMY     APPENDECTOMY     BREAST BIOPSY Left    bx x 3-neg   COLON SURGERY     sigmoid resection   COLONOSCOPY     2007, 2012   COLONOSCOPY WITH PROPOFOL N/A 09/17/2015   Procedure: COLONOSCOPY WITH PROPOFOL;  Surgeon: Earline Mayotte, MD;  Location: [facility];  Service: Endoscopy;  Laterality: N/A;   OOPHORECTOMY     SPINE SURGERY     L4-5    OB History   No obstetric history on file.      Home Medications    Prior to Admission medications   Medication Sig Start Date End Date Taking? Authorizing Provider  doxycycline (VIBRAMYCIN) 100 MG capsule Take 1 capsule (100 mg total) by mouth 2 (two) times daily for 5 days. 07/13/22 07/18/22 Yes Shirlee Latch, PA-C  acetaminophen (TYLENOL) 325 MG tablet Take by mouth. 03/13/21   [provider]  albuterol (VENTOLIN HFA)  108 (90 Base) MCG/ACT inhaler Inhale 2 puffs into the lungs every 6 (six) hours as needed for wheezing or shortness of breath. 07/30/21   Salena Saner, MD  atorvastatin (LIPITOR) 10 MG tablet TAKE 1 TABLET BY MOUTH EVERY DAY 12/15/21   Cannady, Jolene T, NP  benazepril (LOTENSIN) 40 MG tablet Take 1 tablet (40 mg total) by mouth daily. 06/10/22   Cannady, Corrie Dandy T, NP  Budeson-Glycopyrrol-Formoterol (BREZTRI AEROSPHERE) 160-9-4.8 MCG/ACT AERO Inhale 2 puffs into the lungs in the morning and at bedtime. 07/21/21   Salena Saner, MD  diphenoxylate-atropine (LOMOTIL) 2.5-0.025 MG tablet Take 1 tablet by mouth 4 (four) times daily  as needed for diarrhea or loose stools. Take it along with immodium 05/31/22   Earna Coder, MD  hydrocortisone (ANUSOL-HC) 25 MG suppository Place 1 suppository (25 mg total) rectally 2 (two) times daily. 12/09/21   Cannady, Corrie Dandy T, NP  LORazepam (ATIVAN) 1 MG tablet Take 0.5 tablets (0.5 mg total) by mouth 2 (two) times daily as needed for anxiety. 06/14/22   Cannady, Corrie Dandy T, NP  mometasone (NASONEX) 50 MCG/ACT nasal spray USE 1 SPRAY TO EACH NOSTRIL TWICE A DAY 04/09/22   Salena Saner, MD  Multiple Vitamins-Minerals (MULTIVITAMIN WITH MINERALS) tablet Take 1 tablet by mouth daily.    [provider]  PARoxetine (PAXIL) 30 MG tablet Take 1 tablet (30 mg total) by mouth daily. 06/10/22   Marjie Skiff, NP  Spacer/Aero-Holding Chambers (OPTICHAMBER DIAMOND) MISC 1 EACH BY OTHER ROUTE ONCE FOR 1 DOSE. 07/08/20   [provider]  vitamin C (ASCORBIC ACID) 250 MG tablet Take 250 mg by mouth daily.    [provider]  vitamin E 1000 UNIT capsule Take by mouth.    [provider]    Family History Family History  Problem Relation Age of Onset   Osteoporosis Mother    Hypertension Father    Heart attack Father    Stroke Maternal Grandfather    Breast cancer Sister 86    Social History Social History   Tobacco Use   Smoking status: Former    Packs/day: 1.00    Years: 35.00    Additional pack years: 0.00    Total pack years: 35.00    Types: Cigarettes    Quit date: 02/22/2021    Years since quitting: 1.3   Smokeless tobacco: Never  Vaping Use   Vaping Use: Never used  Substance Use Topics   Alcohol use: No    Alcohol/week: 0.0 standard drinks of alcohol   Drug use: No     Allergies   Augmentin [amoxicillin-pot clavulanate] and Biaxin [clarithromycin]   Review of Systems Review of Systems  Constitutional:  Negative for fatigue and fever.  Musculoskeletal:  Positive for arthralgias and joint swelling.  Skin:  Positive for  color change and wound.  Neurological:  Negative for weakness and numbness.     Physical Exam Triage Vital Signs ED Triage Vitals  Enc Vitals Group     BP      Pulse      Resp      Temp      Temp src      SpO2      Weight      Height      Head Circumference      Peak Flow      Pain Score      Pain Loc      Pain Edu?  Excl. in GC?    No data found.  Updated Vital Signs BP 115/70 (BP Location: Right Arm)   Pulse 88   Temp 99.1 F (37.3 C) (Oral)   Resp 16   SpO2 91%     Physical Exam Vitals and nursing note reviewed.  Constitutional:      General: She is not in acute distress.    Appearance: Normal appearance. She is not ill-appearing or toxic-appearing.  HENT:     Head: Normocephalic and atraumatic.  Eyes:     General: No scleral icterus.       Right eye: No discharge.        Left eye: No discharge.     Conjunctiva/sclera: Conjunctivae normal.  Cardiovascular:     Rate and Rhythm: Normal rate and regular rhythm.     Pulses: Normal pulses.     Heart sounds: Normal heart sounds.  Pulmonary:     Effort: Pulmonary effort is normal. No respiratory distress.     Breath sounds: Normal breath sounds.  Musculoskeletal:     Cervical back: Neck supple.  Skin:    General: Skin is dry.     Comments: Left forearm: See the image below.  I had to soak off the gauze bandage which was stuck to her skin.  On exam she has jagged skin tear/laceration which has been repaired with Dermabond.  Dermabond still intact.  No bleeding but there was a little bit of blood on her bandage.  Significant ecchymosis of the forearm.  Full range of motion of elbow no bony tenderness.  Good pulses and strength.  Neurological:     General: No focal deficit present.     Mental Status: She is alert. Mental status is at baseline.     Motor: No weakness.     Gait: Gait normal.  Psychiatric:        Mood and Affect: Mood normal.        Behavior: Behavior normal.        Thought Content:  Thought content normal.      UC Treatments / Results  Labs (all labs ordered are listed, but only abnormal results are displayed) Labs Reviewed - No data to display  EKG   Radiology No results found.  Procedures Procedures (including critical care time)  Medications Ordered in UC Medications - No data to display  Initial Impression / Assessment and Plan / UC Course  I have reviewed the triage vital signs and the nursing notes.  Pertinent labs & imaging results that were available during my care of the patient were reviewed by me and considered in my medical decision making (see chart for details).   82 year old female returning 4 days after initial injury and evaluation at the urgent care for continued pain of the left forearm with bruising and laceration.  The area was initially repaired with Dermabond.  Patient reports continued bleeding and pain.  See image above.  Patient has healing skin tear with intact Dermabond.  Surrounding ecchymosis and mild swelling.  No bony tenderness.  Full range of motion of elbow.  Cleaned the wound with saline and rebandaged with a nonadherent bandage.  Advised her to use nonadherent bandages in the future and continue the antibiotic.  I sent a few more days to complete the course.  Advised Tylenol for pain relief.  She has not been taking anything for pain.  Advised elevating arm to help with swelling.  We reviewed returning for any signs of infection including  increased swelling, increased redness or pustular drainage or fever.  She may also consider following up with her primary care provider.   Final Clinical Impressions(s) / UC Diagnoses   Final diagnoses:  Laceration of left forearm, initial encounter  Left forearm pain     Discharge Instructions      -The area does not look infected.  Continue with the antibiotics.  I sent more to the pharmacy. - Change your bandage daily for the next few days.  Use nonstick bandages. - Elevate it  and take Tylenol as needed for pain relief. - If you notice increased redness or swelling, worsening pain or any pustular drainage you should return for reevaluation for possible infection.  This can take some time to heal.     ED Prescriptions     Medication Sig Dispense Auth. Provider   doxycycline (VIBRAMYCIN) 100 MG capsule Take 1 capsule (100 mg total) by mouth 2 (two) times daily for 5 days. 10 capsule Shirlee Latch, PA-C      PDMP not reviewed this encounter.   Shirlee Latch, PA-C 07/13/22 1211

## 2022-07-13 NOTE — ED Triage Notes (Signed)
Patient with abrasion to left arm. Patient states she was seen here Friday for the abrasion. Daughter wrapped arm on Saturday and they told her she should be able to take it off Sunday and when she did it started to bleed a lot.

## 2022-07-13 NOTE — Discharge Instructions (Signed)
-  The area does not look infected.  Continue with the antibiotics.  I sent more to the pharmacy. - Change your bandage daily for the next few days.  Use nonstick bandages. - Elevate it and take Tylenol as needed for pain relief. - If you notice increased redness or swelling, worsening pain or any pustular drainage you should return for reevaluation for possible infection.  This can take some time to heal.

## 2022-07-23 ENCOUNTER — Telehealth: Payer: Self-pay | Admitting: Emergency Medicine

## 2022-07-23 NOTE — Telephone Encounter (Signed)
Patient left message wanting to know if she still had to leave her bandage on her arm while she showered.  Spoke with Dr. Rachael Darby and patient is fine to remove her bandage and wash the area in the shower.  Patient was informed that she still has the dermabond over the skin tear and not to pull or pick at the skin glue.  Patient was instructed to wash the area gently with soap and water and to rinse the area well and to pat the area dry.  Patient verbalized understanding. Patient states that she has no pain in the area and that is is healing well.

## 2022-07-29 ENCOUNTER — Telehealth: Payer: Self-pay | Admitting: Internal Medicine

## 2022-07-29 NOTE — Telephone Encounter (Signed)
Katherine [name] LEFT A MESSAGE WITH ANSWERING SERVICE TO CANCEL HER APPOINTMENT FRIDAY 07/30/2022 W/ DR Donneta Romberg FOR LAB /MD/ & INFUSION. SHE SAID SHE WOULD CALL BACK TO RESCHEDULE.

## 2022-07-30 ENCOUNTER — Inpatient Hospital Stay: Payer: Medicare HMO | Admitting: Internal Medicine

## 2022-07-30 ENCOUNTER — Inpatient Hospital Stay: Payer: Medicare HMO

## 2022-08-03 ENCOUNTER — Other Ambulatory Visit: Payer: Self-pay | Admitting: Nurse Practitioner

## 2022-08-04 ENCOUNTER — Other Ambulatory Visit: Payer: Self-pay | Admitting: Pulmonary Disease

## 2022-08-04 NOTE — Telephone Encounter (Signed)
Requested medication (s) are due for refill today - yes  Requested medication (s) are on the active medication list -yes  Future visit scheduled -yes  Last refill: 07/30/21 8g 2RF  Notes to clinic: outside provider  Requested Prescriptions  Pending Prescriptions Disp Refills   albuterol (VENTOLIN HFA) 108 (90 Base) MCG/ACT inhaler [Pharmacy Med Name: ALBUTEROL HFA (VENTOLIN) INH] 18 each 59    Sig: TAKE 2 PUFFS BY MOUTH EVERY 6 HOURS AS NEEDED FOR WHEEZE OR SHORTNESS OF BREATH     Pulmonology:  Beta Agonists 2 Passed - 08/03/2022  4:44 PM      Passed - Last BP in normal range    BP Readings from Last 1 Encounters:  07/13/22 115/70         Passed - Last Heart Rate in normal range    Pulse Readings from Last 1 Encounters:  07/13/22 88         Passed - Valid encounter within last 12 months    Recent Outpatient Visits           1 month ago Medicare annual wellness visit, subsequent   [place] [facility] [place], Denning T, NP   7 months ago CLL (chronic lymphocytic leukemia) (HCC)   [place] [facility] [place], Corrie Dandy T, NP   11 months ago CLL (chronic lymphocytic leukemia) (HCC)   [facility] [place] [facility] [place], Corrie Dandy T, NP   1 year ago Centrilobular emphysema (HCC)   [place] [facility] [place], Corrie Dandy T, NP   1 year ago Community acquired pneumonia of right middle lobe of lung   [place] [facility] [place], Dorie Rank, NP       Future Appointments             In 4 months Cannady, Dorie Rank, NP [place] [facility]               Requested Prescriptions  Pending Prescriptions Disp Refills   albuterol (VENTOLIN HFA) 108 (90 Base) MCG/ACT inhaler [Pharmacy Med Name: ALBUTEROL HFA (VENTOLIN) INH] 18 each 59    Sig: TAKE 2 PUFFS BY MOUTH EVERY 6 HOURS AS NEEDED FOR WHEEZE OR SHORTNESS OF BREATH     Pulmonology:  Beta Agonists 2 Passed - 08/03/2022  4:44 PM       Passed - Last BP in normal range    BP Readings from Last 1 Encounters:  07/13/22 115/70         Passed - Last Heart Rate in normal range    Pulse Readings from Last 1 Encounters:  07/13/22 88         Passed - Valid encounter within last 12 months    Recent Outpatient Visits           1 month ago Medicare annual wellness visit, subsequent   [place] [facility] [place], Harrison T, NP   7 months ago CLL (chronic lymphocytic leukemia) (HCC)   [facility] [place], Elizabeth T, NP   11 months ago CLL (chronic lymphocytic leukemia) (HCC)   [place] [facility] [place], Corrie Dandy T, NP   1 year ago Centrilobular emphysema (HCC)   [place] [facility] [place], Corrie Dandy T, NP   1 year ago Community acquired pneumonia of right middle lobe of lung    [facility] [place], Dorie Rank, NP       Future Appointments  In 4 months Cannady, Dorie Rank, NP [place] [facility]

## 2022-10-06 ENCOUNTER — Telehealth: Payer: Self-pay

## 2022-10-06 ENCOUNTER — Ambulatory Visit: Payer: Medicare HMO | Admitting: Pulmonary Disease

## 2022-10-06 ENCOUNTER — Encounter: Payer: Self-pay | Admitting: Pulmonary Disease

## 2022-10-06 VITALS — BP 120/80 | HR 79 | Temp 97.5°F | Ht 62.99 in | Wt 144.8 lb

## 2022-10-06 DIAGNOSIS — R0989 Other specified symptoms and signs involving the circulatory and respiratory systems: Secondary | ICD-10-CM

## 2022-10-06 DIAGNOSIS — J479 Bronchiectasis, uncomplicated: Secondary | ICD-10-CM | POA: Diagnosis not present

## 2022-10-06 DIAGNOSIS — C9110 Chronic lymphocytic leukemia of B-cell type not having achieved remission: Secondary | ICD-10-CM | POA: Diagnosis not present

## 2022-10-06 DIAGNOSIS — J449 Chronic obstructive pulmonary disease, unspecified: Secondary | ICD-10-CM

## 2022-10-06 DIAGNOSIS — D803 Selective deficiency of immunoglobulin G [IgG] subclasses: Secondary | ICD-10-CM | POA: Diagnosis not present

## 2022-10-06 MED ORDER — BREZTRI AEROSPHERE 160-9-4.8 MCG/ACT IN AERO: 2.0000 | INHALATION_SPRAY | Freq: Two times a day (BID) | RESPIRATORY_TRACT | Status: DC

## 2022-10-06 NOTE — Telephone Encounter (Signed)
Form has been filed out and faxed to [employer].  Nothing further needed.

## 2022-10-06 NOTE — Patient Instructions (Addendum)
Fill out the forms that we provided for you so you can get assistance for you Breztri.  We provided you with samples of the [facility].  Continue using her vest.  We are going to do another CT of the chest.  We will see him in follow-up in 2 months time call sooner should any new problems arise.

## 2022-10-06 NOTE — Telephone Encounter (Signed)
Patient given [employer] patient assistance forms during OV. She will complete forms and bring them back to our office.

## 2022-10-06 NOTE — Progress Notes (Unsigned)
Subjective:    Patient ID: Katherine [name], female    DOB: [birth year], 82 y.o.   MRN: [id number]  Patient Care Team: Marjie Skiff, NP as PCP - General (Nurse Practitioner) Debbrah Alar, MD (Dermatology) Scot Jun, MD (Inactive) (Gastroenterology) Lemar Livings, Merrily Pew, MD (General Surgery) Earna Coder, MD as Consulting Physician (Oncology) Salena Saner, MD as Consulting Physician (Pulmonary Disease)  Chief Complaint  Patient presents with   Follow-up    No SOB or wheezing. Occasional drainage and cough.    HPI This is an 82 year old former smoker (quit January 2023) with 35-pack-year history of smoking and a history as noted below, who presents for follow-up on abnormal chest CT and bronchiectasis. she was last seen here on 07 January 2022.  At that time she was using an InCourage vest to manage mucus plugging due to bronchiectasis.  He continues to be compliant with the therapy vest.  She has noted great improvement in her symptoms with the InCourage vest.  She has moderate COPD by prior PFTs.  Previously, she had resolution of left lower lobe atelectasis after aggressive pulmonary toilet and management of her COPD.  She has a history of CLL and IgG deficiency and bronchiectasis.  Since starting the vest physiotherapy she has noted that her sputum is less tenacious and easier to bring up. She uses therapy twice a day.  She has been religious with this.  He is using Breztri 2 puffs twice a day every 48 hours because of the cost of the medication she wants to "stretch" the use.  Notes that on the days she uses the [place] she does very well.  On days she does not use the [place] she uses albuterol twice a day.  Has not had any fevers, chills or sweats.  She has not had any hemoptysis.  Sputum has cleared.  Cough has improved dramatically since starting on vest physiotherapy.  She has no chest pain.  He remains quite spry.  She does not endorse any other  symptomatology.  Overall she feels well and looks well.  She gets IVIG infusions for her IgG deficiency related to her CLL.   Her last CT chest was on 08 Jul 2021 she was due for reimaging however, insurance denied the scan.  Try to reschedule this CT.  Review of Systems A 10 point review of systems was performed and it is as noted above otherwise negative.   Patient Active Problem List   Diagnosis Date Noted   Controlled substance agreement signed 12/09/2021   Type O blood, Rh negative 05/09/2021   Long-term current use of benzodiazepine 02/05/2020   Splenomegaly 02/02/2020   Thrombocytopenia (HCC) 02/02/2020   Allergic rhinitis 07/18/2018   Goals of care, counseling/discussion 07/05/2018   Nicotine dependence, cigarettes, uncomplicated 05/01/2018   Centrilobular emphysema (HCC) 02/05/2018   Advanced care planning/counseling discussion 01/09/2018   Anxiety 01/04/2017   Hypercholesterolemia 01/08/2016   Atherosclerosis of aorta (HCC) 01/07/2016   History of colonic polyps 08/14/2015   CLL (chronic lymphocytic leukemia) (HCC) 05/28/2015   Senile purpura (HCC) 01/06/2015   Essential hypertension 01/06/2015   Depression, recurrent (HCC) 01/06/2015    Social History   Tobacco Use   Smoking status: Former    Current packs/day: 0.00    Average packs/day: 1 pack/day for 35.0 years (35.0 ttl pk-yrs)    Types: Cigarettes    Start date: 02/22/1986    Quit date: 02/22/2021    Years since quitting: 1.6  Smokeless tobacco: Never  Substance Use Topics   Alcohol use: No    Alcohol/week: 0.0 standard drinks of alcohol    Allergies  Allergen Reactions   Augmentin [Amoxicillin-Pot Clavulanate] Swelling    tongue   Biaxin [Clarithromycin] Swelling and Rash    tongue    Current Meds  Medication Sig   acetaminophen (TYLENOL) 325 MG tablet Take by mouth.   albuterol (VENTOLIN HFA) 108 (90 Base) MCG/ACT inhaler TAKE 2 PUFFS BY MOUTH EVERY 6 HOURS AS NEEDED FOR WHEEZE OR SHORTNESS OF  BREATH   atorvastatin (LIPITOR) 10 MG tablet TAKE 1 TABLET BY MOUTH EVERY DAY   benazepril (LOTENSIN) 40 MG tablet Take 1 tablet (40 mg total) by mouth daily.   BREZTRI AEROSPHERE 160-9-4.8 MCG/ACT AERO INHALE 2 PUFFS INTO THE LUNGS IN THE MORNING AND AT BEDTIME.   diphenoxylate-atropine (LOMOTIL) 2.5-0.025 MG tablet Take 1 tablet by mouth 4 (four) times daily as needed for diarrhea or loose stools. Take it along with immodium   hydrocortisone (ANUSOL-HC) 25 MG suppository Place 1 suppository (25 mg total) rectally 2 (two) times daily.   LORazepam (ATIVAN) 1 MG tablet Take 0.5 tablets (0.5 mg total) by mouth 2 (two) times daily as needed for anxiety.   mometasone (NASONEX) 50 MCG/ACT nasal spray USE 1 SPRAY TO EACH NOSTRIL TWICE A DAY   Multiple Vitamins-Minerals (MULTIVITAMIN WITH MINERALS) tablet Take 1 tablet by mouth daily.   PARoxetine (PAXIL) 30 MG tablet Take 1 tablet (30 mg total) by mouth daily.   Spacer/Aero-Holding Chambers (OPTICHAMBER DIAMOND) MISC 1 EACH BY OTHER ROUTE ONCE FOR 1 DOSE.   vitamin C (ASCORBIC ACID) 250 MG tablet Take 250 mg by mouth daily.   vitamin E 1000 UNIT capsule Take by mouth.    Immunization History  Administered Date(s) Administered   Fluad Quad(high Dose 65+) 11/08/2019, 11/12/2020, 12/09/2021   Influenza, High Dose Seasonal PF 01/04/2017, 11/07/2018   Influenza,inj,Quad PF,6+ Mos 01/06/2015, 11/21/2015, 10/07/2017   Influenza-Unspecified 01/06/2015, 11/21/2015, 01/04/2017, 10/07/2017, 11/07/2018, 12/03/2020   PFIZER(Purple Top)SARS-COV-2 Vaccination 05/11/2019, 06/01/2019, 02/05/2020   Pneumococcal Conjugate-13 03/06/2014   Pneumococcal-Unspecified 02/22/1993, 02/23/2003   Rsv, Bivalent, Protein Subunit Rsvpref,pf Verdis Frederickson) 02/12/2022   Td 07/24/2003   Tdap 11/18/2010   Zoster Recombinant(Shingrix) 08/02/2017, 02/04/2018, 05/27/2022   Zoster, Live 11/03/2005        Objective:     BP 120/80 (BP Location: Left Arm, Cuff Size: Normal)   Pulse  79   Temp (!) 97.5 F (36.4 C)   Ht 5' 2.99" (1.6 m)   Wt 144 lb 12.8 oz (65.7 kg)   SpO2 98%   BMI 25.66 kg/m   SpO2: 98 % O2 Device: None (Room air)  GENERAL: Well-developed, thin woman, well-groomed, spry, no acute distress, fully ambulatory.  Stational dyspnea. HEAD: Normocephalic, atraumatic.  EYES: Pupils equal, round, reactive to light.  No scleral icterus.  MOUTH: Oral mucosa moist.  Dentition intact. NECK: Supple. No thyromegaly. Trachea midline. No JVD.  No adenopathy. PULMONARY: Good air entry bilaterally.  Coarse breath sounds otherwise, no adventitious sounds. CARDIOVASCULAR: S1 and S2. Regular rate and rhythm.  No rubs, murmurs or gallops heard. ABDOMEN: Benign. MUSCULOSKELETAL: No joint deformity, no clubbing, 1-2+ edema of the feet/ankles noted today, this is chronic. NEUROLOGIC: No focal deficit noted, no gait disturbance, speech is fluent. SKIN: Intact,warm,dry.  Multiple ecchymosis/senile purpura particularly upper extremities. PSYCH: Mood and behavior normal.   Assessment & Plan:     ICD-10-CM   1. Bronchiectasis without complication (HCC)  J47.9 CT  CHEST WO CONTRAST   Continue vest physiotherapy Continue pulmonary hygiene    2. Abnormal finding of lung  R09.89    Areas of collapse/atelectasis noted previously Repeat CT    3. Stage 2 moderate COPD by GOLD classification (HCC)  J44.9    Continue Breztri, samples provided to the patient Assistance form provided for the patient Recommend 2 puffs twice a day Continue use of PRN albuterol    4. IgG deficiency (HCC)  D80.3    Issue adds complexity to her management She is on infusion therapy Follows with hematology/oncology    5. CLL (chronic lymphocytic leukemia) (HCC)  C91.10    This issue adds complexity to her management Follows with hematology/oncology     Orders Placed This Encounter  Procedures   CT CHEST WO CONTRAST    Standing Status:   Future    Standing Expiration Date:   10/06/2023     Order Specific Question:   Preferred imaging location?    Answer:   [facility]   Meds ordered this encounter  Medications   Budeson-Glycopyrrol-Formoterol (BREZTRI AEROSPHERE) 160-9-4.8 MCG/ACT AERO    Sig: Inhale 2 puffs into the lungs in the morning and at bedtime.    Order Specific Question:   Lot Number?    Answer:   [id number]    Order Specific Question:   Expiration Date?    Answer:   02/22/2025    Order Specific Question:   Manufacturer?    Answer:   [employer] [[id number]]    Order Specific Question:   Quantity    Answer:   2   Will see the patient in follow-up in 2 months time she is to call sooner should any new problems arise.   Gailen Shelter, MD Advanced Bronchoscopy PCCM [facility]    *This note was dictated using voice recognition software/Dragon.  Despite best efforts to proofread, errors can occur which can change the meaning. Any transcriptional errors that result from this process are unintentional and may not be fully corrected at the time of dictation.

## 2022-10-07 ENCOUNTER — Encounter: Payer: Self-pay | Admitting: Pulmonary Disease

## 2022-10-08 ENCOUNTER — Ambulatory Visit
Admission: EM | Admit: 2022-10-08 | Discharge: 2022-10-08 | Disposition: A | Discharge status: Home / Self Care | Payer: Medicare HMO | Source: Home / Self Care

## 2022-10-08 ENCOUNTER — Encounter: Payer: Self-pay | Admitting: Internal Medicine

## 2022-10-08 DIAGNOSIS — S51032A Puncture wound without foreign body of left elbow, initial encounter: Secondary | ICD-10-CM

## 2022-10-08 MED ORDER — DOXYCYCLINE HYCLATE 100 MG PO CAPS: 100.0000 mg | ORAL_CAPSULE | Freq: Two times a day (BID) | ORAL | 0 refills | Status: DC

## 2022-10-08 NOTE — ED Triage Notes (Signed)
Wound on left elbow that's been there for 3-4 weeks that wont heal. Pain 10/10 when touched. Used peroxide, roll on Asprin and neosporin

## 2022-10-08 NOTE — Discharge Instructions (Addendum)
Today you were evaluated for the wound to your left elbow  Begin doxycycline every morning and every evening for 7 days to clear any germs that is causing your wound to prolong healing  You may use warm compresses in 10 to 15-minute intervals for comfort  You may take Tylenol 325 mg or 500 mg every 6 hours as needed for pain  Stop use of peroxide and use unscented soap and water to cleanse area once a day  You do not have to cover and you may leave open to air  If you have any concerns regarding healing after completion of medicine you may follow-up with urgent care or your primary doctor for reevaluation

## 2022-10-08 NOTE — ED Provider Notes (Addendum)
[facility]    CSN: [id number] Arrival date & time: 10/08/22  1054      History   Chief Complaint Chief Complaint  Patient presents with   Wound Check    HPI Katherine [name] is a 82 y.o. female.   Patient presents for evaluation of a wound to the left elbow beginning 3 to 4 weeks ago.  Hit the side of the elbow on the edge of her antique bed.  Has been cleaning with peroxide and Neosporin.  Has noticed redness.  Endorses constant soreness worsened with palpation.  Denies drainage or fever.   Past Medical History:  Diagnosis Date   Allergy    Anxiety    Depression    GERD (gastroesophageal reflux disease)    Hemorrhoids    Hypertension    Leukemia, lymphoid (HCC)    CLL   Lobar pneumonia (HCC)    Osteopenia    Personal history of chemotherapy     Patient Active Problem List   Diagnosis Date Noted   Controlled substance agreement signed 12/09/2021   Type O blood, Rh negative 05/09/2021   Long-term current use of benzodiazepine 02/05/2020   Splenomegaly 02/02/2020   Thrombocytopenia (HCC) 02/02/2020   Allergic rhinitis 07/18/2018   Goals of care, counseling/discussion 07/05/2018   Nicotine dependence, cigarettes, uncomplicated 05/01/2018   Centrilobular emphysema (HCC) 02/05/2018   Advanced care planning/counseling discussion 01/09/2018   Anxiety 01/04/2017   Hypercholesterolemia 01/08/2016   Atherosclerosis of aorta (HCC) 01/07/2016   History of colonic polyps 08/14/2015   CLL (chronic lymphocytic leukemia) (HCC) 05/28/2015   Senile purpura (HCC) 01/06/2015   Essential hypertension 01/06/2015   Depression, recurrent (HCC) 01/06/2015    Past Surgical History:  Procedure Laterality Date   ABDOMINAL HYSTERECTOMY     APPENDECTOMY     BREAST BIOPSY Left    bx x 3-neg   COLON SURGERY     sigmoid resection   COLONOSCOPY     2007, 2012   COLONOSCOPY WITH PROPOFOL N/A 09/17/2015   Procedure: COLONOSCOPY WITH PROPOFOL;  Surgeon: Earline Mayotte, MD;  Location: [facility] ENDOSCOPY;  Service: Endoscopy;  Laterality: N/A;   OOPHORECTOMY     SPINE SURGERY     L4-5    OB History   No obstetric history on file.      Home Medications    Prior to Admission medications   Medication Sig Start Date End Date Taking? Authorizing Provider  acetaminophen (TYLENOL) 325 MG tablet Take by mouth. 03/13/21  Yes [provider]  albuterol (VENTOLIN HFA) 108 (90 Base) MCG/ACT inhaler TAKE 2 PUFFS BY MOUTH EVERY 6 HOURS AS NEEDED FOR WHEEZE OR SHORTNESS OF BREATH 08/04/22  Yes Salena Saner, MD  atorvastatin (LIPITOR) 10 MG tablet TAKE 1 TABLET BY MOUTH EVERY DAY 12/15/21  Yes Cannady, Jolene T, NP  benazepril (LOTENSIN) 40 MG tablet Take 1 tablet (40 mg total) by mouth daily. 06/10/22  Yes Cannady, Jolene T, NP  BREZTRI AEROSPHERE 160-9-4.8 MCG/ACT AERO INHALE 2 PUFFS INTO THE LUNGS IN THE MORNING AND AT BEDTIME. 08/04/22  Yes Salena Saner, MD  Budeson-Glycopyrrol-Formoterol (BREZTRI AEROSPHERE) 160-9-4.8 MCG/ACT AERO Inhale 2 puffs into the lungs in the morning and at bedtime. 10/06/22  Yes Salena Saner, MD  diphenoxylate-atropine (LOMOTIL) 2.5-0.025 MG tablet Take 1 tablet by mouth 4 (four) times daily as needed for diarrhea or loose stools. Take it along with immodium 05/31/22  Yes Earna Coder, MD  doxycycline (VIBRAMYCIN) 100  MG capsule Take 1 capsule (100 mg total) by mouth 2 (two) times daily. 10/08/22  Yes Burnetta Kohls, Elita Boone, NP  hydrocortisone (ANUSOL-HC) 25 MG suppository Place 1 suppository (25 mg total) rectally 2 (two) times daily. 12/09/21  Yes Cannady, Jolene T, NP  LORazepam (ATIVAN) 1 MG tablet Take 0.5 tablets (0.5 mg total) by mouth 2 (two) times daily as needed for anxiety. 06/14/22  Yes Cannady, Jolene T, NP  mometasone (NASONEX) 50 MCG/ACT nasal spray USE 1 SPRAY TO EACH NOSTRIL TWICE A DAY 04/09/22  Yes Salena Saner, MD  Multiple Vitamins-Minerals (MULTIVITAMIN WITH MINERALS) tablet Take 1 tablet  by mouth daily.   Yes [provider]  PARoxetine (PAXIL) 30 MG tablet Take 1 tablet (30 mg total) by mouth daily. 06/10/22  Yes Cannady, Dorie Rank, NP  Spacer/Aero-Holding Chambers (OPTICHAMBER DIAMOND) MISC 1 EACH BY OTHER ROUTE ONCE FOR 1 DOSE. 07/08/20  Yes [provider]  vitamin C (ASCORBIC ACID) 250 MG tablet Take 250 mg by mouth daily.   Yes [provider]  vitamin E 1000 UNIT capsule Take by mouth.   Yes [provider]    Family History Family History  Problem Relation Age of Onset   Osteoporosis Mother    Hypertension Father    Heart attack Father    Stroke Maternal Grandfather    Breast cancer Sister 33    Social History Social History   Tobacco Use   Smoking status: Former    Current packs/day: 0.00    Average packs/day: 1 pack/day for 35.0 years (35.0 ttl pk-yrs)    Types: Cigarettes    Start date: 02/22/1986    Quit date: 02/22/2021    Years since quitting: 1.6   Smokeless tobacco: Never  Vaping Use   Vaping status: Never Used  Substance Use Topics   Alcohol use: No    Alcohol/week: 0.0 standard drinks of alcohol   Drug use: No     Allergies   Augmentin [amoxicillin-pot clavulanate] and Biaxin [clarithromycin]   Review of Systems Review of Systems  Constitutional: Negative.   Respiratory: Negative.    Cardiovascular: Negative.   Genitourinary: Negative.   Skin:  Positive for wound. Negative for color change, pallor and rash.     Physical Exam Triage Vital Signs ED Triage Vitals  Encounter Vitals Group     BP 10/08/22 1108 137/65     Systolic BP Percentile --      Diastolic BP Percentile --      Pulse Rate 10/08/22 1108 75     Resp 10/08/22 1108 17     Temp 10/08/22 1108 98.3 F (36.8 C)     Temp src --      SpO2 10/08/22 1108 92 %     Weight 10/08/22 1107 145 lb (65.8 kg)     Height --      Head Circumference --      Peak Flow --      Pain Score 10/08/22 1107 10     Pain Loc --      Pain Education --       Exclude from Growth Chart --    No data found.  Updated Vital Signs BP 137/65 (BP Location: Right Arm)   Pulse 75   Temp 98.3 F (36.8 C)   Resp 17   Wt 145 lb (65.8 kg)   SpO2 92%   BMI 25.69 kg/m   Visual Acuity Right Eye Distance:   Left Eye Distance:  Bilateral Distance:    Right Eye Near:   Left Eye Near:    Bilateral Near:     Physical Exam Constitutional:      Appearance: Normal appearance.  Eyes:     Extraocular Movements: Extraocular movements intact.  Pulmonary:     Effort: Pulmonary effort is normal.  Skin:    Comments: Defer to photo   Neurological:     Mental Status: She is alert and oriented to person, place, and time.      UC Treatments / Results  Labs (all labs ordered are listed, but only abnormal results are displayed) Labs Reviewed - No data to display  EKG   Radiology No results found.  Procedures Procedures (including critical care time)  Medications Ordered in UC Medications - No data to display  Initial Impression / Assessment and Plan / UC Course  I have reviewed the triage vital signs and the nursing notes.  Pertinent labs & imaging results that were available during my care of the patient were reviewed by me and considered in my medical decision making (see chart for details).  Puncture wound of the left elbow, initial encounter  Wound very tender to palpation, unable to expel any drainage from the site, firm ,wound is not fluctuant, placed on doxycycline, has tolerated well in the past, recommended heat to the affected area and Tylenol for management of any discomfort, advise follow-up with urgent care or primary doctor for reevaluation as needed Final Clinical Impressions(s) / UC Diagnoses   Final diagnoses:  Puncture wound of left elbow, initial encounter     Discharge Instructions      Today you were evaluated for the wound to your left elbow  Begin doxycycline every morning and every evening for 7 days to  clear any germs that is causing your wound to prolong healing  You may use warm compresses in 10 to 15-minute intervals for comfort  You may take Tylenol 325 mg or 500 mg every 6 hours as needed for pain  Stop use of peroxide and use unscented soap and water to cleanse area once a day  You do not have to cover and you may leave open to air  If you have any concerns regarding healing after completion of medicine you may follow-up with urgent care or your primary doctor for reevaluation   ED Prescriptions     Medication Sig Dispense Auth. Provider   doxycycline (VIBRAMYCIN) 100 MG capsule Take 1 capsule (100 mg total) by mouth 2 (two) times daily. 14 capsule Keimari Bancroft, Elita Boone, NP      PDMP not reviewed this encounter.   Valinda Hoar, NP 10/08/22 1135    Valinda Hoar, NP 10/08/22 1136

## 2022-10-12 ENCOUNTER — Ambulatory Visit
Admission: RE | Admit: 2022-10-12 | Discharge: 2022-10-12 | Disposition: A | Discharge status: Home / Self Care | Payer: Medicare HMO | Source: Ambulatory Visit | Attending: Nurse Practitioner | Admitting: Nurse Practitioner

## 2022-10-12 DIAGNOSIS — Z1231 Encounter for screening mammogram for malignant neoplasm of breast: Secondary | ICD-10-CM | POA: Insufficient documentation

## 2022-10-13 ENCOUNTER — Ambulatory Visit
Admission: RE | Admit: 2022-10-13 | Discharge: 2022-10-13 | Disposition: A | Discharge status: Home / Self Care | Payer: Medicare HMO | Source: Ambulatory Visit

## 2022-10-13 DIAGNOSIS — J479 Bronchiectasis, uncomplicated: Secondary | ICD-10-CM | POA: Diagnosis not present

## 2022-10-13 DIAGNOSIS — R918 Other nonspecific abnormal finding of lung field: Secondary | ICD-10-CM | POA: Diagnosis not present

## 2022-10-13 DIAGNOSIS — J432 Centrilobular emphysema: Secondary | ICD-10-CM | POA: Diagnosis not present

## 2022-10-13 DIAGNOSIS — C9110 Chronic lymphocytic leukemia of B-cell type not having achieved remission: Secondary | ICD-10-CM | POA: Diagnosis not present

## 2022-10-13 NOTE — Progress Notes (Signed)
Contacted via MyChart   Normal mammogram, may repeat in one year:)

## 2022-10-14 ENCOUNTER — Ambulatory Visit
Admission: EM | Admit: 2022-10-14 | Discharge: 2022-10-14 | Disposition: A | Discharge status: Home / Self Care | Payer: Medicare HMO | Attending: Internal Medicine | Admitting: Internal Medicine

## 2022-10-14 DIAGNOSIS — Z5189 Encounter for other specified aftercare: Secondary | ICD-10-CM

## 2022-10-14 NOTE — Discharge Instructions (Signed)
Your wound is healing well. There is no fluid collection Please feel free to apply warm compress to the area affected Please put loose gauze and dressing over the area to cushion the area.  This will help with pain if you accidentally hit head against an object. Return to urgent care if you have any other concerns.  There is no need for any more antibiotics.

## 2022-10-14 NOTE — ED Provider Notes (Signed)
[facility]    CSN: [id number] Arrival date & time: 10/14/22  2841      History   Chief Complaint Chief Complaint  Patient presents with   Arm Pain    HPI Katherine [name] is a 82 y.o. female to the urgent care for wound check.  Patient hit her elbow against an object 6 weeks ago.  She developed a wound on the left elbow.  She was seen in the urgent care a week ago and prescribed a course of antibiotics.  The wound has healed but patient complains of exquisite pain on palpation.  No discharge from the area.  No surrounding erythema.  Pain is intermittent, aggravated by palpation without any radiation.  HPI  Past Medical History:  Diagnosis Date   Allergy    Anxiety    Depression    GERD (gastroesophageal reflux disease)    Hemorrhoids    Hypertension    Leukemia, lymphoid (HCC)    CLL   Lobar pneumonia (HCC)    Osteopenia    Personal history of chemotherapy     Patient Active Problem List   Diagnosis Date Noted   Controlled substance agreement signed 12/09/2021   Type O blood, Rh negative 05/09/2021   Long-term current use of benzodiazepine 02/05/2020   Splenomegaly 02/02/2020   Thrombocytopenia (HCC) 02/02/2020   Allergic rhinitis 07/18/2018   Goals of care, counseling/discussion 07/05/2018   Nicotine dependence, cigarettes, uncomplicated 05/01/2018   Centrilobular emphysema (HCC) 02/05/2018   Advanced care planning/counseling discussion 01/09/2018   Anxiety 01/04/2017   Hypercholesterolemia 01/08/2016   Atherosclerosis of aorta (HCC) 01/07/2016   History of colonic polyps 08/14/2015   CLL (chronic lymphocytic leukemia) (HCC) 05/28/2015   Senile purpura (HCC) 01/06/2015   Essential hypertension 01/06/2015   Depression, recurrent (HCC) 01/06/2015    Past Surgical History:  Procedure Laterality Date   ABDOMINAL HYSTERECTOMY     APPENDECTOMY     BREAST BIOPSY Left    bx x 3-neg   COLON SURGERY     sigmoid resection   COLONOSCOPY     2007,  2012   COLONOSCOPY WITH PROPOFOL N/A 09/17/2015   Procedure: COLONOSCOPY WITH PROPOFOL;  Surgeon: Earline Mayotte, MD;  Location: [facility];  Service: Endoscopy;  Laterality: N/A;   OOPHORECTOMY     SPINE SURGERY     L4-5    OB History   No obstetric history on file.      Home Medications    Prior to Admission medications   Medication Sig Start Date End Date Taking? Authorizing Provider  acetaminophen (TYLENOL) 325 MG tablet Take by mouth. 03/13/21  Yes [provider]  albuterol (VENTOLIN HFA) 108 (90 Base) MCG/ACT inhaler TAKE 2 PUFFS BY MOUTH EVERY 6 HOURS AS NEEDED FOR WHEEZE OR SHORTNESS OF BREATH 08/04/22  Yes Salena Saner, MD  atorvastatin (LIPITOR) 10 MG tablet TAKE 1 TABLET BY MOUTH EVERY DAY 12/15/21  Yes Cannady, Jolene T, NP  benazepril (LOTENSIN) 40 MG tablet Take 1 tablet (40 mg total) by mouth daily. 06/10/22  Yes Cannady, Jolene T, NP  BREZTRI AEROSPHERE 160-9-4.8 MCG/ACT AERO INHALE 2 PUFFS INTO THE LUNGS IN THE MORNING AND AT BEDTIME. 08/04/22  Yes Salena Saner, MD  Budeson-Glycopyrrol-Formoterol (BREZTRI AEROSPHERE) 160-9-4.8 MCG/ACT AERO Inhale 2 puffs into the lungs in the morning and at bedtime. 10/06/22  Yes Salena Saner, MD  diphenoxylate-atropine (LOMOTIL) 2.5-0.025 MG tablet Take 1 tablet by mouth 4 (four) times daily as needed for diarrhea  or loose stools. Take it along with immodium 05/31/22  Yes Earna Coder, MD  doxycycline (VIBRAMYCIN) 100 MG capsule Take 1 capsule (100 mg total) by mouth 2 (two) times daily. 10/08/22  Yes White, Elita Boone, NP  hydrocortisone (ANUSOL-HC) 25 MG suppository Place 1 suppository (25 mg total) rectally 2 (two) times daily. 12/09/21  Yes Cannady, Jolene T, NP  LORazepam (ATIVAN) 1 MG tablet Take 0.5 tablets (0.5 mg total) by mouth 2 (two) times daily as needed for anxiety. 06/14/22  Yes Cannady, Jolene T, NP  mometasone (NASONEX) 50 MCG/ACT nasal spray USE 1 SPRAY TO EACH NOSTRIL TWICE A DAY  04/09/22  Yes Salena Saner, MD  Multiple Vitamins-Minerals (MULTIVITAMIN WITH MINERALS) tablet Take 1 tablet by mouth daily.   Yes [provider]  PARoxetine (PAXIL) 30 MG tablet Take 1 tablet (30 mg total) by mouth daily. 06/10/22  Yes Cannady, Dorie Rank, NP  Spacer/Aero-Holding Chambers (OPTICHAMBER DIAMOND) MISC 1 EACH BY OTHER ROUTE ONCE FOR 1 DOSE. 07/08/20  Yes [provider]  vitamin C (ASCORBIC ACID) 250 MG tablet Take 250 mg by mouth daily.   Yes [provider]  vitamin E 1000 UNIT capsule Take by mouth.   Yes [provider]    Family History Family History  Problem Relation Age of Onset   Osteoporosis Mother    Hypertension Father    Heart attack Father    Stroke Maternal Grandfather    Breast cancer Sister 81    Social History Social History   Tobacco Use   Smoking status: Former    Current packs/day: 0.00    Average packs/day: 1 pack/day for 35.0 years (35.0 ttl pk-yrs)    Types: Cigarettes    Start date: 02/22/1986    Quit date: 02/22/2021    Years since quitting: 1.6   Smokeless tobacco: Never  Vaping Use   Vaping status: Never Used  Substance Use Topics   Alcohol use: No    Alcohol/week: 0.0 standard drinks of alcohol   Drug use: No     Allergies   Augmentin [amoxicillin-pot clavulanate] and Biaxin [clarithromycin]   Review of Systems Review of Systems As per HPI  Physical Exam Triage Vital Signs ED Triage Vitals  Encounter Vitals Group     BP 10/14/22 0857 125/87     Systolic BP Percentile --      Diastolic BP Percentile --      Pulse Rate 10/14/22 0857 79     Resp --      Temp 10/14/22 0857 98.6 F (37 C)     Temp Source 10/14/22 0857 Oral     SpO2 10/14/22 0857 97 %     Weight 10/14/22 0856 145 lb (65.8 kg)     Height 10/14/22 0856 5\' 3"  (1.6 m)     Head Circumference --      Peak Flow --      Pain Score 10/14/22 0855 10     Pain Loc --      Pain Education --      Exclude from Growth Chart --     No data found.  Updated Vital Signs BP 125/87 (BP Location: Left Arm)   Pulse 79   Temp 98.6 F (37 C) (Oral)   Ht 5\' 3"  (1.6 m)   Wt 65.8 kg   SpO2 97%   BMI 25.69 kg/m   Visual Acuity Right Eye Distance:   Left Eye Distance:   Bilateral Distance:  Right Eye Near:   Left Eye Near:    Bilateral Near:     Physical Exam Vitals and nursing note reviewed.  Constitutional:      Appearance: Normal appearance.  Cardiovascular:     Rate and Rhythm: Normal rate and regular rhythm.  Musculoskeletal:        General: Normal range of motion.  Skin:    Comments: Papular lesion on the right elbow.  Lesion is tender to palpation.  Scab is in place.  No fluctuance.  Lesion is not attached to underlying tissue.  No surrounding erythema.  Neurological:     Mental Status: She is alert.      UC Treatments / Results  Labs (all labs ordered are listed, but only abnormal results are displayed) Labs Reviewed - No data to display  EKG   Radiology No results found.  Procedures Procedures (including critical care time)  Medications Ordered in UC Medications - No data to display  Initial Impression / Assessment and Plan / UC Course  I have reviewed the triage vital signs and the nursing notes.  Pertinent labs & imaging results that were available during my care of the patient were reviewed by me and considered in my medical decision making (see chart for details).     1.  Visit for wound check: Needle aspiration was negative for abscess There are no signs of infection Patient advised to complete the course of antibiotics Patient is advised to put padding over the area to avoid pain if she hits it against objects. Final Clinical Impressions(s) / UC Diagnoses   Final diagnoses:  Visit for wound check     Discharge Instructions      Your wound is healing well. There is no fluid collection Please feel free to apply warm compress to the area affected Please put  loose gauze and dressing over the area to cushion the area.  This will help with pain if you accidentally hit head against an object. Return to urgent care if you have any other concerns.  There is no need for any more antibiotics.   ED Prescriptions   None    PDMP not reviewed this encounter.   Merrilee Jansky, MD 10/14/22 1017

## 2022-10-14 NOTE — ED Triage Notes (Addendum)
Pt is with her sister  Pt states that she was here last Thursday and was placed on an antibiotic and tylenol for a wound on her left forearm.  Pt states that she hit her arm against the footboard of the bed 6 weeks ago and injured her arm. Pt had torn skin and now has a knot formed on the forearm.   Pt states that she has 1 more day of antibiotic but her arm still hurts   Pt states that it only hurts when she touches it.

## 2022-10-20 ENCOUNTER — Telehealth: Payer: Self-pay | Admitting: Pulmonary Disease

## 2022-10-20 NOTE — Telephone Encounter (Signed)
Katherine Saner, MD  P [facility] Clinical Pool The CT of the chest showed that the findings in the lung have improved and are stable otherwise.  Continue doing her vest therapy and using her inhaler.  On the CT the spleen looked more enlarged than prior but this could be due to her CLL.  She should discuss this with Dr. Donneta Romberg.  Patient is aware of results and voiced her understanding.  Nothing further needed.

## 2022-10-26 NOTE — Telephone Encounter (Signed)
Copied from CRM [id number]. Topic: General - Other >> Oct 26, 2022 10:23 AM Macon Large wrote: Reason for CRM: Pt called for mammogram results. Pt requests call back to go over results. Cb# [phone]

## 2022-10-26 NOTE — Telephone Encounter (Signed)
Called and notified patient of results per result note in the chart.

## 2022-10-29 ENCOUNTER — Encounter: Payer: Self-pay | Admitting: Internal Medicine

## 2022-10-29 ENCOUNTER — Telehealth: Payer: Self-pay

## 2022-10-29 ENCOUNTER — Telehealth: Payer: Self-pay | Admitting: *Deleted

## 2022-10-29 ENCOUNTER — Telehealth: Payer: Self-pay | Admitting: Pharmacist

## 2022-10-29 ENCOUNTER — Other Ambulatory Visit (HOSPITAL_COMMUNITY): Payer: Self-pay

## 2022-10-29 ENCOUNTER — Inpatient Hospital Stay: Payer: Medicare HMO | Attending: Internal Medicine | Admitting: Internal Medicine

## 2022-10-29 ENCOUNTER — Inpatient Hospital Stay: Payer: Medicare HMO

## 2022-10-29 VITALS — BP 140/61 | HR 96 | Temp 98.4°F | Ht 63.0 in | Wt 142.6 lb

## 2022-10-29 DIAGNOSIS — Z87891 Personal history of nicotine dependence: Secondary | ICD-10-CM | POA: Insufficient documentation

## 2022-10-29 DIAGNOSIS — R5383 Other fatigue: Secondary | ICD-10-CM | POA: Insufficient documentation

## 2022-10-29 DIAGNOSIS — Z8701 Personal history of pneumonia (recurrent): Secondary | ICD-10-CM | POA: Diagnosis not present

## 2022-10-29 DIAGNOSIS — C9110 Chronic lymphocytic leukemia of B-cell type not having achieved remission: Secondary | ICD-10-CM | POA: Diagnosis not present

## 2022-10-29 DIAGNOSIS — L989 Disorder of the skin and subcutaneous tissue, unspecified: Secondary | ICD-10-CM | POA: Insufficient documentation

## 2022-10-29 DIAGNOSIS — R0602 Shortness of breath: Secondary | ICD-10-CM | POA: Diagnosis not present

## 2022-10-29 DIAGNOSIS — R161 Splenomegaly, not elsewhere classified: Secondary | ICD-10-CM | POA: Diagnosis not present

## 2022-10-29 DIAGNOSIS — J449 Chronic obstructive pulmonary disease, unspecified: Secondary | ICD-10-CM | POA: Diagnosis not present

## 2022-10-29 LAB — CBC WITH DIFFERENTIAL (CANCER CENTER ONLY)
Abs Immature Granulocytes: 0.6 10*3/uL — ABNORMAL HIGH (ref 0.00–0.07)
Basophils Absolute: 0.1 10*3/uL (ref 0.0–0.1)
Basophils Relative: 0 %
Eosinophils Absolute: 0.2 10*3/uL (ref 0.0–0.5)
Eosinophils Relative: 0 %
HCT: 37.1 % (ref 36.0–46.0)
Hemoglobin: 10.1 g/dL — ABNORMAL LOW (ref 12.0–15.0)
Immature Granulocytes: 0 %
Lymphocytes Relative: 94 %
Lymphs Abs: 345.7 10*3/uL — ABNORMAL HIGH (ref 0.7–4.0)
MCH: 29.6 pg (ref 26.0–34.0)
MCHC: 27.2 g/dL — ABNORMAL LOW (ref 30.0–36.0)
MCV: 108.8 fL — ABNORMAL HIGH (ref 80.0–100.0)
Monocytes Absolute: 17 10*3/uL — ABNORMAL HIGH (ref 0.1–1.0)
Monocytes Relative: 5 %
Neutro Abs: 3.4 10*3/uL (ref 1.7–7.7)
Neutrophils Relative %: 1 %
Platelet Count: 105 10*3/uL — ABNORMAL LOW (ref 150–400)
RBC: 3.41 MIL/uL — ABNORMAL LOW (ref 3.87–5.11)
RDW: 18.2 % — ABNORMAL HIGH (ref 11.5–15.5)
Smear Review: NORMAL
WBC Count: 366.9 10*3/uL (ref 4.0–10.5)
nRBC: 0 % (ref 0.0–0.2)

## 2022-10-29 LAB — CMP (CANCER CENTER ONLY)
ALT: 12 U/L (ref 0–44)
AST: 26 U/L (ref 15–41)
Albumin: 4.8 g/dL (ref 3.5–5.0)
Alkaline Phosphatase: 157 U/L — ABNORMAL HIGH (ref 38–126)
Anion gap: 9 (ref 5–15)
BUN: 19 mg/dL (ref 8–23)
CO2: 24 mmol/L (ref 22–32)
Calcium: 9.3 mg/dL (ref 8.9–10.3)
Chloride: 106 mmol/L (ref 98–111)
Creatinine: 1.04 mg/dL — ABNORMAL HIGH (ref 0.44–1.00)
GFR, Estimated: 54 mL/min — ABNORMAL LOW (ref >60)
Glucose, Bld: 118 mg/dL — ABNORMAL HIGH (ref 70–99)
Potassium: 5 mmol/L (ref 3.5–5.1)
Sodium: 139 mmol/L (ref 135–145)
Total Bilirubin: 0.7 mg/dL (ref 0.3–1.2)
Total Protein: 6.7 g/dL (ref 6.5–8.1)

## 2022-10-29 LAB — LACTATE DEHYDROGENASE: LDH: 201 U/L — ABNORMAL HIGH (ref 98–192)

## 2022-10-29 NOTE — Telephone Encounter (Signed)
Received Critical lab results from Murvin Donning in lab WBC 366.9 -Read back. Dr Donneta Romberg notified.

## 2022-10-29 NOTE — Telephone Encounter (Addendum)
Oral Oncology Patient Advocate Encounter  Prior Authorization for Brukinsa has been approved.    PA# [id number]  Effective dates: 02/22/22 through 02/22/23  Patients co-pay is $1,992.39.   Obtained [employer] Grant to make co-pay $0.00.   Ardeen Fillers, CPhT Oncology Pharmacy Patient Advocate  [facility]  [phone] (phone) [phone] (fax) 10/29/2022 4:21 PM

## 2022-10-29 NOTE — Assessment & Plan Note (Addendum)
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 Deletion-poor tolerance to Ibrutinib-hematuria; ; and Acalburtinib [stopped in March 2022 because of diarrhea].    # Again I had a long discussion with the patient and daughter-from a clinical standpoint patient's CLL is PROGRESSING [although patient's symptoms-of fatigue are quite subjective]; ALSO patient's white count is rising-April 2024-White count is  237; also hemoglobin 11.2 platelets slightly low at 90s.   # AUG 21st, 2024- CT chest [Dr.Gonzalez]- Partially visualized splenomegaly, increased-again suggestive of progressive CLL.  Spleen up to umbilicus.  # Recommend use of Zanuburinib given prior intolerance to therapy.   Again discussed importance side effects including but not limited to nausea vomiting diarrhea.  Recommend starting at 50% dose.  Discussed with pharmacy.  Understand treatments are palliative not curative.  Patient r reluctantly is willing to try.  Get baseline labs today.  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   DEC 2023- immunoglobulins- IgG- 283. Patient declines given concerns of diarrhea./Poor tolerance.  #COPD/history of pneumonia -bilateral Jan 2023 [QUITsmoking-April, 2023; [facility]]-right middle lobe lung atelectasis [Dr.Gonzalez] - AUG 2024- CT- Improved aeration of the right middle lobe with residual mild plate like scarring versus atelectasis in the right middle lobe. Stable.   # Ongoing fatigue multifactorial-underlying chronic respiratory failure; underlying CLL; possible depression/anxiety.   # Vaccination: recommend flu shot; HOLD off covid for now.   # left Nodular growth [x1 month]- s/p trauma-but ? SCC- will check with Dr.Graham.  Will reach out to the office for an expedited appointment.  # DISPOSITION:  # Follow up TBD-Dr.B

## 2022-10-29 NOTE — Telephone Encounter (Signed)
Oral Oncology Patient Advocate Encounter  New authorization   Received notification that prior authorization for Brukinsa is required.   PA submitted on 10/29/22  Key [id number]  Status is pending     Ardeen Fillers, CPhT Oncology Pharmacy Patient Advocate  [facility]  [phone] (phone) [phone] (fax) 10/29/2022 4:13 PM

## 2022-10-29 NOTE — Telephone Encounter (Signed)
Oral Oncology Patient Advocate Encounter  Was successful in securing patient a $8,000.00 grant from [employer] to provide copayment coverage for Brukinsa.  This will keep the out of pocket expense at $0.     [employer] ID: [id number]   The billing information is as follows and has been shared with [facility].    RxBin: [id number] PCN: [id number] Member ID: [id number] Group ID: [id number] Dates of Eligibility: 09/29/22 through 09/28/23  Fund:  Chronic Lymphocytic Leukemia   Ardeen Fillers, CPhT Oncology Pharmacy Patient Advocate  [facility]  [phone] (phone) [phone] (fax) 10/29/2022 4:26 PM

## 2022-10-29 NOTE — Progress Notes (Signed)
[facility] CONSULT NOTE  Patient Care Team: Marjie Skiff, NP as PCP - General (Nurse Practitioner) Debbrah Alar, MD (Dermatology) Scot Jun, MD (Inactive) (Gastroenterology) Lemar Livings Merrily Pew, MD (General Surgery) Earna Coder, MD as Consulting Physician (Oncology) Salena Saner, MD as Consulting Physician (Pulmonary Disease)  CHIEF COMPLAINTS/PURPOSE OF CONSULTATION:  CLL  #  Oncology History Overview Note  Noble Subasic is a 82 y.o. female with chronic lymphocytic leukemia (CLL).  WBC has ranged between 22,000 - 101,7000 since 05/2011.   She has received Rituxan x 2 four week cycles (06/27/2015 and 05/27/2017).  Hepatitis B surface antigen and hepatitis B surface antibody were negative on 07/04/2015.   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  CCND1, ATM, chromosome 12, and TP53 were normal.     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  There was a CD5 and CD23 positive monoclonal B cell population with lambda light chain restriction, negative for FMC7 and CD38, representing  88% of leukocytes, >5,000/uL. There was no loss of, or aberrant expression of, the pan T cell  antigens to  suggest a neoplastic T cell process. CD4:CD8 ratio 2.0  No circulating blasts were detected. There was no immunophenotypic evidence of abnormal myeloid maturation.  IGH/BCL2 by FISH is pending. --------------------------------------------------------  She began ibrutinib on 04/24/2019 (discontinued on 05/30/2019).                     She developed blood-streaked sputum (slight) then significant hematuria.                     She declined further ibrutinib. ---------------------------------------------------------------------------------            She began acalabrutinib on 03/14/2020 (held on 05/13/2020 secondary  to diarrhea). --------------------------------------------------------------------------------------   She has a 35 pack year smoking history.  She is in the low dose chest CT program.  Low dose chest CT on 02/02/2018 revealed a new endobronchial lesion in the segmental bronchus to the medial segment of the right middle lobe. This may simply reflect an area of retained secretions, however, the possibility of an endobronchial neoplasm should be considered. Lung-RADS 4AS, suspicious.  Low dose chest CT on 07/05/2018 revealed Lung-RADS 2, benign appearance or behavior.  Prior right middle lobe endobronchial lesion/mucous plugging was no longer visualized.  New mucous plugging in the right lower lobe.  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  Accompanied by her daughter.  [name] 82 y.o.  female CLL-13 q. Deletion currently on surveillance because of intolerance/side effects to multiple TKIs is here for follow-up/s/p IVIG infusions is here for follow-up.  Patient discontinued IVIG infusions because of intolerance; and no further appointments were made.   C/o of lesion on left arm, 9/10, x1 month or more. S/p evaluation with Urgent care.    Had a chest scan 2  weeks ago at [facility].  She is concerned about enlarging spleen.   Continues to have shortness of breath and also worsening fatigue.  Patient stopped using her IVIG infusions given tolerance/diarrhea.  Review of Systems  Constitutional:  Positive for malaise/fatigue.  Negative for chills, diaphoresis, fever and weight loss.  HENT:  Negative for nosebleeds and sore throat.   Eyes:  Negative for double vision.  Respiratory:  Positive for hemoptysis  and shortness of breath. Negative for cough, sputum production and wheezing.   Cardiovascular:  Negative for chest pain, palpitations, orthopnea and leg swelling.  Gastrointestinal:  Negative for abdominal pain, blood in stool, constipation, diarrhea, heartburn, melena, nausea and vomiting.  Genitourinary:  Negative for dysuria, frequency and urgency.  Musculoskeletal:  Positive for back pain. Negative for joint pain.  Skin: Negative.  Negative for itching and rash.  Neurological:  Negative for dizziness, tingling, focal weakness, weakness and headaches.  Endo/Heme/Allergies:  Does not bruise/bleed easily.  Psychiatric/Behavioral:  Negative for depression. The patient is not nervous/anxious and does not have insomnia.      MEDICAL HISTORY:  Past Medical History:  Diagnosis Date   Allergy    Anxiety    Depression    GERD (gastroesophageal reflux disease)    Hemorrhoids    Hypertension    Leukemia, lymphoid (HCC)    CLL   Lobar pneumonia (HCC)    Osteopenia    Personal history of chemotherapy     SURGICAL HISTORY: Past Surgical History:  Procedure Laterality Date   ABDOMINAL HYSTERECTOMY     APPENDECTOMY     BREAST BIOPSY Left    bx x 3-neg   COLON SURGERY     sigmoid resection   COLONOSCOPY     2007, 2012   COLONOSCOPY WITH PROPOFOL N/A 09/17/2015   Procedure: COLONOSCOPY WITH PROPOFOL;  Surgeon: Earline Mayotte, MD;  Location: [facility] ENDOSCOPY;  Service: Endoscopy;  Laterality: N/A;   OOPHORECTOMY     SPINE SURGERY     L4-5    SOCIAL HISTORY: Social History   Socioeconomic History   Marital status: Married    Spouse name: Not on file   Number of children: Not on file   Years of education: 12   Highest education level: 12th grade  Occupational History   Not on file  Tobacco Use   Smoking status: Former    Current packs/day: 0.00    Average packs/day: 1 pack/day for 35.0 years (35.0 ttl pk-yrs)    Types: Cigarettes    Start date: 02/22/1986    Quit date:  02/22/2021    Years since quitting: 1.6   Smokeless tobacco: Never  Vaping Use   Vaping status: Never Used  Substance and Sexual Activity   Alcohol use: No    Alcohol/week: 0.0 standard drinks of alcohol   Drug use: No   Sexual activity: Not on file  Other Topics Concern   Not on file  Social History Narrative   Not on file   Social Determinants of Health   Financial Resource Strain: Low Risk  (06/10/2022)   Overall Financial Resource Strain (CARDIA)    Difficulty of Paying Living Expenses: Not hard at all  Food Insecurity: No Food Insecurity (06/10/2022)   Hunger Vital Sign    Worried About Running Out of Food in the Last Year: Never true    Ran Out of Food in the Last Year: Never true  Transportation Needs: No Transportation Needs (06/10/2022)   PRAPARE - Administrator, Civil Service (Medical): No    Lack of Transportation (Non-Medical): No  Physical Activity: Insufficiently Active (06/10/2022)   Exercise Vital Sign    Days of Exercise per Week: 2 days    Minutes of Exercise per Session: 20  min  Stress: No Stress Concern Present (06/10/2022)   [employer] of Occupational Health - Occupational Stress Questionnaire    Feeling of Stress : Only a little  Social Connections: Moderately Integrated (06/10/2022)   Social Connection and Isolation Panel [NHANES]    Frequency of Communication with Friends and Family: More than three times a week    Frequency of Social Gatherings with Friends and Family: More than three times a week    Attends Religious Services: More than 4 times per year    Active Member of [employer] or Organizations: No    Attends Banker Meetings: Never    Marital Status: Married  Catering manager Violence: Not At Risk (06/10/2022)   Humiliation, Afraid, Rape, and Kick questionnaire    Fear of Current or Ex-Partner: No    Emotionally Abused: No    Physically Abused: No    Sexually Abused: No    FAMILY HISTORY: Family History  Problem  Relation Age of Onset   Osteoporosis Mother    Hypertension Father    Heart attack Father    Stroke Maternal Grandfather    Breast cancer Sister 62    ALLERGIES:  is allergic to augmentin [amoxicillin-pot clavulanate] and biaxin [clarithromycin].  MEDICATIONS:  Current Outpatient Medications  Medication Sig Dispense Refill   acetaminophen (TYLENOL) 325 MG tablet Take by mouth.     albuterol (VENTOLIN HFA) 108 (90 Base) MCG/ACT inhaler TAKE 2 PUFFS BY MOUTH EVERY 6 HOURS AS NEEDED FOR WHEEZE OR SHORTNESS OF BREATH 18 each 59   atorvastatin (LIPITOR) 10 MG tablet TAKE 1 TABLET BY MOUTH EVERY DAY 90 tablet 3   benazepril (LOTENSIN) 40 MG tablet Take 1 tablet (40 mg total) by mouth daily. 90 tablet 4   BREZTRI AEROSPHERE 160-9-4.8 MCG/ACT AERO INHALE 2 PUFFS INTO THE LUNGS IN THE MORNING AND AT BEDTIME. 10.7 each 11   Budeson-Glycopyrrol-Formoterol (BREZTRI AEROSPHERE) 160-9-4.8 MCG/ACT AERO Inhale 2 puffs into the lungs in the morning and at bedtime.     diphenoxylate-atropine (LOMOTIL) 2.5-0.025 MG tablet Take 1 tablet by mouth 4 (four) times daily as needed for diarrhea or loose stools. Take it along with immodium 60 tablet 1   hydrocortisone (ANUSOL-HC) 25 MG suppository Place 1 suppository (25 mg total) rectally 2 (two) times daily. 12 suppository 12   LORazepam (ATIVAN) 1 MG tablet Take 0.5 tablets (0.5 mg total) by mouth 2 (two) times daily as needed for anxiety. 60 tablet 4   mometasone (NASONEX) 50 MCG/ACT nasal spray USE 1 SPRAY TO EACH NOSTRIL TWICE A DAY 51 each 1   Multiple Vitamins-Minerals (MULTIVITAMIN WITH MINERALS) tablet Take 1 tablet by mouth daily.     PARoxetine (PAXIL) 30 MG tablet Take 1 tablet (30 mg total) by mouth daily. 90 tablet 4   Spacer/Aero-Holding Chambers (OPTICHAMBER DIAMOND) MISC 1 EACH BY OTHER ROUTE ONCE FOR 1 DOSE.     vitamin C (ASCORBIC ACID) 250 MG tablet Take 250 mg by mouth daily.     vitamin E 1000 UNIT capsule Take by mouth.     No current  facility-administered medications for this visit.      Marland Kitchen  PHYSICAL EXAMINATION: ECOG PERFORMANCE STATUS: 1 - Symptomatic but completely ambulatory  Vitals:   10/29/22 1527  BP: (!) 140/61  Pulse: 96  Temp: 98.4 F (36.9 C)  SpO2: 93%    Filed Weights   10/29/22 1527  Weight: 142 lb 9.6 oz (64.7 kg)   Spleen-up to umbilicus.  Physical Exam HENT:  Head: Normocephalic and atraumatic.     Mouth/Throat:     Pharynx: No oropharyngeal exudate.  Eyes:     Pupils: Pupils are equal, round, and reactive to light.  Cardiovascular:     Rate and Rhythm: Normal rate and regular rhythm.  Pulmonary:     Effort: No respiratory distress.     Breath sounds: No wheezing.     Comments: Decreased air entry bilaterally at the bases. Abdominal:     General: Bowel sounds are normal. There is no distension.     Palpations: Abdomen is soft. There is no mass.     Tenderness: There is no abdominal tenderness. There is no guarding or rebound.  Musculoskeletal:        General: No tenderness. Normal range of motion.     Cervical back: Normal range of motion and neck supple.  Skin:    General: Skin is warm.  Neurological:     Mental Status: She is alert and oriented to person, place, and time.  Psychiatric:        Mood and Affect: Affect normal.      LABORATORY DATA:  I have reviewed the data as listed Lab Results  Component Value Date   WBC 237.9 (HH) 05/31/2022   HGB 11.2 (L) 05/31/2022   HCT 36.8 05/31/2022   MCV 105.1 (H) 05/31/2022   PLT 98 (L) 05/31/2022   Recent Labs    01/28/22 0949 03/31/22 0806 05/31/22 0807  NA 142 141 139  K 4.6 4.4 4.5  CL 101 105 106  CO2 28 28 27   GLUCOSE 101* 105* 106*  BUN 12 14 14   CREATININE 0.82 0.92 0.88  CALCIUM 8.9 9.4 9.1  GFRNONAA >60 >60 >60  PROT 6.8 6.8 6.9  ALBUMIN 4.7 4.4 4.3  AST 24 30 28   ALT 13 11 11   ALKPHOS 118 119 134*  BILITOT 1.2 1.2 0.8    RADIOGRAPHIC STUDIES: I have personally reviewed the radiological  images as listed and agreed with the findings in the report. CT CHEST WO CONTRAST  Result Date: 10/19/2022 CLINICAL DATA:  Follow-up bronchiectasis and right middle lobe atelectasis. CLL. * Tracking Code: BO * EXAM: CT CHEST WITHOUT CONTRAST TECHNIQUE: Multidetector CT imaging of the chest was performed following the standard protocol without IV contrast. RADIATION DOSE REDUCTION: This exam was performed according to the departmental dose-optimization program which includes automated exposure control, adjustment of the mA and/or kV according to patient size and/or use of iterative reconstruction technique. COMPARISON:  07/08/2021 chest CT. FINDINGS: Cardiovascular: Normal heart size. No significant pericardial effusion/thickening. Left anterior descending and right coronary atherosclerosis. Atherosclerotic nonaneurysmal thoracic aorta. Normal caliber pulmonary arteries. Mediastinum/Nodes: No significant thyroid nodules. Unremarkable esophagus. No pathologically enlarged axillary, mediastinal or hilar lymph nodes, noting limited sensitivity for the detection of hilar adenopathy on this noncontrast study. Lungs/Pleura: No pneumothorax. No pleural effusion. Moderate centrilobular emphysema. No acute consolidative airspace disease, lung masses or significant pulmonary nodules. Improved aeration of the right middle lobe with residual mild platelike scarring versus atelectasis in the right middle lobe. Chronic thin scattered parenchymal bands in the lingula and lower lobes compatible with nonspecific mild postinfectious/postinflammatory scarring. Stable small to moderate right Bochdalek's fat containing hernia. No significant regions of bronchiectasis. Upper abdomen: Partially visualized splenomegaly, increased (axial dimensions 15.9 x 9.4 cm, previously 13.1 x 9.0 cm). No discrete splenic masses. Musculoskeletal: No aggressive appearing focal osseous lesions. Marked thoracic spondylosis. IMPRESSION: 1. Partially  visualized splenomegaly, increased. 2. Improved aeration of the right  middle lobe with residual mild platelike scarring versus atelectasis in the right middle lobe. 3. Chronic thin scattered parenchymal bands in the lingula and lower lobes compatible with nonspecific mild postinfectious/postinflammatory scarring. 4. Two-vessel coronary atherosclerosis. 5. Aortic Atherosclerosis (ICD10-I70.0) and Emphysema (ICD10-J43.9). Electronically Signed   By: Delbert Phenix M.D.   On: 10/19/2022 21:50   MM 3D SCREENING MAMMOGRAM BILATERAL BREAST  Result Date: 10/13/2022 CLINICAL DATA:  Screening. EXAM: DIGITAL SCREENING BILATERAL MAMMOGRAM WITH TOMOSYNTHESIS AND CAD TECHNIQUE: Bilateral screening digital craniocaudal and mediolateral oblique mammograms were obtained. Bilateral screening digital breast tomosynthesis was performed. The images were evaluated with computer-aided detection. COMPARISON:  Previous exam(s). ACR Breast Density Category b: There are scattered areas of fibroglandular density. FINDINGS: There are no findings suspicious for malignancy. IMPRESSION: No mammographic evidence of malignancy. A result letter of this screening mammogram will be mailed directly to the patient. RECOMMENDATION: Screening mammogram in one year. (Code:SM-B-01Y) BI-RADS CATEGORY  1: Negative. Electronically Signed   By: Ted Mcalpine M.D.   On: 10/13/2022 14:58    ASSESSMENT & PLAN:   CLL (chronic lymphocytic leukemia) (HCC) #CLL-Rai stage II; FISH 13 q. Deletion-poor tolerance to Ibrutinib-hematuria; ; and Acalburtinib [stopped in March 2022 because of diarrhea].    # Again I had a long discussion with the patient and daughter-from a clinical standpoint patient's CLL is PROGRESSING [although patient's symptoms-of fatigue are quite subjective]; ALSO patient's white count is rising-April 2024-White count is  237; also hemoglobin 11.2 platelets slightly low at 90s.   # AUG 21st, 2024- CT chest [Dr.Gonzalez]- Partially  visualized splenomegaly, increased-again suggestive of progressive CLL.  Spleen up to umbilicus.  # Recommend use of Zanuburinib given prior intolerance to therapy.   Again discussed importance side effects including but not limited to nausea vomiting diarrhea.  Recommend starting at 50% dose.  Discussed with pharmacy.  Understand treatments are palliative not curative.  Patient r reluctantly is willing to try.  Get baseline labs today.  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   DEC 2023- immunoglobulins- IgG- 283. Patient declines given concerns of diarrhea./Poor tolerance.  #COPD/history of pneumonia -bilateral Jan 2023 [QUITsmoking-April, 2023; [facility]]-right middle lobe lung atelectasis [Dr.Gonzalez] - AUG 2024- CT- Improved aeration of the right middle lobe with residual mild plate like scarring versus atelectasis in the right middle lobe. Stable.   # Ongoing fatigue multifactorial-underlying chronic respiratory failure; underlying CLL; possible depression/anxiety.   # Vaccination: recommend flu shot; HOLD off covid for now.   # left Nodular growth [x1 month]- s/p trauma-but ? SCC- will check with Dr.Graham.  Will reach out to the office for an expedited appointment.  # DISPOSITION:  # Follow up TBD-Dr.B    All questions were answered. The patient knows to call the clinic with any problems, questions or concerns.   Earna Coder, MD 10/29/2022 4:18 PM

## 2022-10-29 NOTE — Progress Notes (Signed)
C/o of lesion on left arm, 9/10, x1 month or more.  Had a chest scan 2  weeks ago at [facility]. Can you look at the scan and go over her spleen?  Has some trouble sleeping, takes tylenol.  Has occasional SOB, when hot and humid.

## 2022-11-01 ENCOUNTER — Other Ambulatory Visit (HOSPITAL_COMMUNITY): Payer: Self-pay

## 2022-11-01 ENCOUNTER — Other Ambulatory Visit: Payer: Self-pay

## 2022-11-01 DIAGNOSIS — C9110 Chronic lymphocytic leukemia of B-cell type not having achieved remission: Secondary | ICD-10-CM

## 2022-11-01 MED ORDER — BRUKINSA 80 MG PO CAPS
160.0000 mg | ORAL_CAPSULE | Freq: Every day | ORAL | 0 refills | Status: DC
Start: 2022-11-01 — End: 2022-12-01
  Filled 2022-11-01: qty 60, 30d supply, fill #0
  Filled 2022-11-01: qty 120, 60d supply, fill #0

## 2022-11-01 NOTE — Telephone Encounter (Signed)
Patient successfully OnBoarded and drug education provided by pharmacist. Medication scheduled to be shipped on Tuesday, 11/02/22 for delivery on Wednesday, 11/03/22 from [facility] to patient's address. Patient also knows to call me at [phone] with any questions or concerns regarding receiving medication or if there is any unexpected change in co-pay.    Ardeen Fillers, CPhT Oncology Pharmacy Patient Advocate  [facility]  [phone] (phone) [phone] (fax) 11/01/2022 4:07 PM

## 2022-11-01 NOTE — Telephone Encounter (Signed)
Clinical Pharmacist Practitioner Encounter   [facility] (Specialty) will deliver medication on 11/03/22. She will start once she has medication in hand.  Patient Education I spoke with patient for overview of new oral chemotherapy medication: Brukinsa (zanubrutinib) for the treatment of progressive CLL, planned duration until disease progression or unacceptable drug toxicity.   Counseled patient on administration, dosing, side effects, monitoring, drug-food interactions, safe handling, storage, and disposal. Patient will take 2 capsules (160 mg total) by mouth daily.   Side effects include but not limited to: rash, fatigue, decrease wbc/hgb/plt, diarrhea.    Reviewed with patient importance of keeping a medication schedule and plan for any missed doses.  After discussion with patient no patient barriers to medication adherence identified.   Katherine [name] voiced understanding and appreciation. All questions answered. Medication handout provided.  Provided patient with Oral Chemotherapy Navigation Clinic phone number. Patient knows to call the office with questions or concerns. Oral Chemotherapy Navigation Clinic will continue to follow.  Remi Haggard, PharmD, BCPS, BCOP, CPP Hematology/Oncology Clinical Pharmacist Practitioner [facility] [phone]  11/01/2022 4:05 PM

## 2022-11-01 NOTE — Telephone Encounter (Signed)
Clinical Pharmacist Practitioner Encounter   Received new prescription for Brukinsa (zanubrutinib) for the treatment of progressive CLL, planned duration until disease progression or unacceptable drug toxicity.  CMP from 10/29/22 assessed, no relevant lab abnormalities. Prescription dose and frequency assessed.   Current medication list in Epic reviewed, one DDIs with zanubrutinib identified: Paroxetine: Zanubrutinib may enhance the antiplatelet effect of agents with antiplatelet properties. Monitor platelets and for patients for signs and symptoms of bleeding   Evaluated chart and no patient barriers to medication adherence identified.   Prescription has been e-scribed to the [facility] for benefits analysis and approval.  Oral Oncology Clinic will continue to follow for insurance authorization, copayment issues, initial counseling and start date.   Remi Haggard, PharmD, BCPS, BCOP, CPP Hematology/Oncology Clinical Pharmacist Practitioner [place] [phone]  11/01/2022 2:17 PM

## 2022-11-02 ENCOUNTER — Other Ambulatory Visit (HOSPITAL_COMMUNITY): Payer: Self-pay

## 2022-11-02 LAB — IMMUNOGLOBULINS A/E/G/M, SERUM
IgA: 10 mg/dL — ABNORMAL LOW (ref 64–422)
IgE (Immunoglobulin E), Serum: <2 [IU]/mL — ABNORMAL LOW (ref 6–495)
IgG (Immunoglobin G), Serum: 304 mg/dL — ABNORMAL LOW (ref 586–1602)
IgM (Immunoglobulin M), Srm: <5 mg/dL — ABNORMAL LOW (ref 26–217)

## 2022-11-03 DIAGNOSIS — C44629 Squamous cell carcinoma of skin of left upper limb, including shoulder: Secondary | ICD-10-CM | POA: Diagnosis not present

## 2022-11-03 DIAGNOSIS — D485 Neoplasm of uncertain behavior of skin: Secondary | ICD-10-CM | POA: Diagnosis not present

## 2022-11-16 ENCOUNTER — Other Ambulatory Visit (HOSPITAL_COMMUNITY): Payer: Self-pay

## 2022-11-17 ENCOUNTER — Other Ambulatory Visit: Payer: Self-pay

## 2022-11-19 ENCOUNTER — Other Ambulatory Visit: Payer: Self-pay

## 2022-11-19 DIAGNOSIS — C44629 Squamous cell carcinoma of skin of left upper limb, including shoulder: Secondary | ICD-10-CM | POA: Diagnosis not present

## 2022-11-20 ENCOUNTER — Inpatient Hospital Stay
Admission: EM | Admit: 2022-11-20 | Discharge: 2022-11-22 | DRG: 378 | Disposition: A | Discharge status: Home / Self Care | Payer: Medicare HMO | Attending: Hospitalist | Admitting: Hospitalist

## 2022-11-20 ENCOUNTER — Other Ambulatory Visit: Payer: Self-pay

## 2022-11-20 ENCOUNTER — Emergency Department: Payer: Medicare HMO

## 2022-11-20 DIAGNOSIS — Z803 Family history of malignant neoplasm of breast: Secondary | ICD-10-CM | POA: Diagnosis not present

## 2022-11-20 DIAGNOSIS — Z87891 Personal history of nicotine dependence: Secondary | ICD-10-CM | POA: Diagnosis not present

## 2022-11-20 DIAGNOSIS — I10 Essential (primary) hypertension: Secondary | ICD-10-CM | POA: Diagnosis present

## 2022-11-20 DIAGNOSIS — J432 Centrilobular emphysema: Secondary | ICD-10-CM | POA: Diagnosis not present

## 2022-11-20 DIAGNOSIS — K625 Hemorrhage of anus and rectum: Secondary | ICD-10-CM | POA: Diagnosis not present

## 2022-11-20 DIAGNOSIS — F419 Anxiety disorder, unspecified: Secondary | ICD-10-CM | POA: Diagnosis present

## 2022-11-20 DIAGNOSIS — Z8249 Family history of ischemic heart disease and other diseases of the circulatory system: Secondary | ICD-10-CM | POA: Diagnosis not present

## 2022-11-20 DIAGNOSIS — Z8262 Family history of osteoporosis: Secondary | ICD-10-CM

## 2022-11-20 DIAGNOSIS — C9110 Chronic lymphocytic leukemia of B-cell type not having achieved remission: Secondary | ICD-10-CM | POA: Diagnosis present

## 2022-11-20 DIAGNOSIS — Z90710 Acquired absence of both cervix and uterus: Secondary | ICD-10-CM | POA: Diagnosis not present

## 2022-11-20 DIAGNOSIS — M8580 Other specified disorders of bone density and structure, unspecified site: Secondary | ICD-10-CM | POA: Diagnosis present

## 2022-11-20 DIAGNOSIS — Z9221 Personal history of antineoplastic chemotherapy: Secondary | ICD-10-CM

## 2022-11-20 DIAGNOSIS — E876 Hypokalemia: Secondary | ICD-10-CM | POA: Diagnosis not present

## 2022-11-20 DIAGNOSIS — K921 Melena: Principal | ICD-10-CM | POA: Diagnosis present

## 2022-11-20 DIAGNOSIS — I951 Orthostatic hypotension: Secondary | ICD-10-CM | POA: Diagnosis not present

## 2022-11-20 DIAGNOSIS — K922 Gastrointestinal hemorrhage, unspecified: Secondary | ICD-10-CM | POA: Diagnosis not present

## 2022-11-20 DIAGNOSIS — D696 Thrombocytopenia, unspecified: Secondary | ICD-10-CM | POA: Diagnosis not present

## 2022-11-20 DIAGNOSIS — F32A Depression, unspecified: Secondary | ICD-10-CM | POA: Diagnosis not present

## 2022-11-20 DIAGNOSIS — R5382 Chronic fatigue, unspecified: Secondary | ICD-10-CM | POA: Diagnosis not present

## 2022-11-20 DIAGNOSIS — R161 Splenomegaly, not elsewhere classified: Secondary | ICD-10-CM | POA: Diagnosis not present

## 2022-11-20 DIAGNOSIS — Z823 Family history of stroke: Secondary | ICD-10-CM | POA: Diagnosis not present

## 2022-11-20 DIAGNOSIS — K219 Gastro-esophageal reflux disease without esophagitis: Secondary | ICD-10-CM | POA: Diagnosis present

## 2022-11-20 DIAGNOSIS — J449 Chronic obstructive pulmonary disease, unspecified: Secondary | ICD-10-CM | POA: Diagnosis present

## 2022-11-20 DIAGNOSIS — K649 Unspecified hemorrhoids: Secondary | ICD-10-CM | POA: Diagnosis present

## 2022-11-20 DIAGNOSIS — D62 Acute posthemorrhagic anemia: Secondary | ICD-10-CM

## 2022-11-20 DIAGNOSIS — Z881 Allergy status to other antibiotic agents status: Secondary | ICD-10-CM

## 2022-11-20 DIAGNOSIS — E875 Hyperkalemia: Secondary | ICD-10-CM

## 2022-11-20 DIAGNOSIS — Z79899 Other long term (current) drug therapy: Secondary | ICD-10-CM

## 2022-11-20 LAB — COMPREHENSIVE METABOLIC PANEL
ALT: 12 U/L (ref 0–44)
AST: 21 U/L (ref 15–41)
Albumin: 4.2 g/dL (ref 3.5–5.0)
Alkaline Phosphatase: 133 U/L — ABNORMAL HIGH (ref 38–126)
Anion gap: 10 (ref 5–15)
BUN: 22 mg/dL (ref 8–23)
CO2: 24 mmol/L (ref 22–32)
Calcium: 9.2 mg/dL (ref 8.9–10.3)
Chloride: 106 mmol/L (ref 98–111)
Creatinine, Ser: 0.86 mg/dL (ref 0.44–1.00)
GFR, Estimated: >60 mL/min (ref >60)
Glucose, Bld: 113 mg/dL — ABNORMAL HIGH (ref 70–99)
Potassium: 6.1 mmol/L — ABNORMAL HIGH (ref 3.5–5.1)
Sodium: 140 mmol/L (ref 135–145)
Total Bilirubin: 1 mg/dL (ref 0.3–1.2)
Total Protein: 6.2 g/dL — ABNORMAL LOW (ref 6.5–8.1)

## 2022-11-20 LAB — CBC
HCT: 39 % (ref 36.0–46.0)
Hemoglobin: 8 g/dL — ABNORMAL LOW (ref 12.0–15.0)
MCH: 24.2 pg — ABNORMAL LOW (ref 26.0–34.0)
MCHC: 20.5 g/dL — ABNORMAL LOW (ref 30.0–36.0)
MCV: 117.8 fL — ABNORMAL HIGH (ref 80.0–100.0)
Platelets: 75 10*3/uL — ABNORMAL LOW (ref 150–400)
RBC: 3.31 MIL/uL — ABNORMAL LOW (ref 3.87–5.11)
WBC: 258.8 10*3/uL (ref 4.0–10.5)
nRBC: 0 % (ref 0.0–0.2)

## 2022-11-20 LAB — CBG MONITORING, ED
Glucose-Capillary: 98 mg/dL (ref 70–99)
Glucose-Capillary: 109 mg/dL — ABNORMAL HIGH (ref 70–99)

## 2022-11-20 LAB — DIFFERENTIAL
Abs Immature Granulocytes: 1.04 10*3/uL — ABNORMAL HIGH (ref 0.00–0.07)
Basophils Absolute: 0.1 10*3/uL (ref 0.0–0.1)
Basophils Relative: 0 %
Eosinophils Absolute: 0.2 10*3/uL (ref 0.0–0.5)
Eosinophils Relative: 0 %
Immature Granulocytes: 0 %
Lymphocytes Relative: 98 %
Lymphs Abs: 724.5 10*3/uL — ABNORMAL HIGH (ref 0.7–4.0)
Monocytes Absolute: 3.5 10*3/uL — ABNORMAL HIGH (ref 0.1–1.0)
Monocytes Relative: 1 %
Neutro Abs: 4.7 10*3/uL (ref 1.7–7.7)
Neutrophils Relative %: 1 %
Smear Review: DECREASED

## 2022-11-20 LAB — HEMOGLOBIN
Hemoglobin: 7.5 g/dL — ABNORMAL LOW (ref 12.0–15.0)
Hemoglobin: 7.4 g/dL — ABNORMAL LOW (ref 12.0–15.0)

## 2022-11-20 LAB — PROTIME-INR
INR: 1.1 (ref 0.8–1.2)
Prothrombin Time: 14 s (ref 11.4–15.2)

## 2022-11-20 LAB — POTASSIUM
Potassium: 6.2 mmol/L — ABNORMAL HIGH (ref 3.5–5.1)
Potassium: 6.6 mmol/L (ref 3.5–5.1)
Potassium: 3.3 mmol/L — ABNORMAL LOW (ref 3.5–5.1)

## 2022-11-20 LAB — APTT: aPTT: 25 s (ref 24–36)

## 2022-11-20 MED ORDER — MELATONIN 5 MG PO TABS
5.0000 mg | ORAL_TABLET | Freq: Every day | ORAL | Status: DC
  Administered 2022-11-20 – 2022-11-21 (×2): 5 mg via ORAL
  Filled 2022-11-20 (×2): qty 1

## 2022-11-20 MED ORDER — IOHEXOL 350 MG/ML SOLN
100.0000 mL | Freq: Once | INTRAVENOUS | Status: AC | PRN
  Administered 2022-11-20: 100 mL via INTRAVENOUS

## 2022-11-20 MED ORDER — SODIUM CHLORIDE 0.9 % IV BOLUS
500.0000 mL | Freq: Once | INTRAVENOUS | Status: AC
  Administered 2022-11-20: 500 mL via INTRAVENOUS

## 2022-11-20 MED ORDER — INSULIN ASPART 100 UNIT/ML IV SOLN
5.0000 [IU] | Freq: Once | INTRAVENOUS | Status: AC
  Administered 2022-11-20: 5 [IU] via INTRAVENOUS
  Filled 2022-11-20: qty 0.05

## 2022-11-20 MED ORDER — SODIUM CHLORIDE 0.9 % IV SOLN: INTRAVENOUS | Status: AC

## 2022-11-20 MED ORDER — ACETAMINOPHEN 325 MG PO TABS
650.0000 mg | ORAL_TABLET | Freq: Four times a day (QID) | ORAL | Status: DC | PRN
  Administered 2022-11-20 – 2022-11-21 (×2): 650 mg via ORAL
  Filled 2022-11-20 (×2): qty 2

## 2022-11-20 MED ORDER — FUROSEMIDE 10 MG/ML IJ SOLN
40.0000 mg | Freq: Once | INTRAMUSCULAR | Status: AC
  Administered 2022-11-20: 40 mg via INTRAVENOUS
  Filled 2022-11-20: qty 4

## 2022-11-20 MED ORDER — HYDROCODONE-ACETAMINOPHEN 5-325 MG PO TABS: 1.0000 | ORAL_TABLET | ORAL | Status: DC | PRN

## 2022-11-20 MED ORDER — ONDANSETRON HCL 4 MG/2ML IJ SOLN: 4.0000 mg | Freq: Four times a day (QID) | INTRAMUSCULAR | Status: DC | PRN

## 2022-11-20 MED ORDER — ALBUTEROL SULFATE (2.5 MG/3ML) 0.083% IN NEBU
2.5000 mg | INHALATION_SOLUTION | Freq: Four times a day (QID) | RESPIRATORY_TRACT | Status: DC | PRN
  Administered 2022-11-20: 2.5 mg via RESPIRATORY_TRACT
  Filled 2022-11-20: qty 3

## 2022-11-20 MED ORDER — MORPHINE SULFATE (PF) 2 MG/ML IV SOLN: 2.0000 mg | INTRAVENOUS | Status: DC | PRN

## 2022-11-20 MED ORDER — ONDANSETRON HCL 4 MG PO TABS: 4.0000 mg | ORAL_TABLET | Freq: Four times a day (QID) | ORAL | Status: DC | PRN

## 2022-11-20 MED ORDER — DEXTROSE 50 % IV SOLN
1.0000 | Freq: Once | INTRAVENOUS | Status: AC
  Administered 2022-11-20: 50 mL via INTRAVENOUS
  Filled 2022-11-20: qty 50

## 2022-11-20 MED ORDER — INSULIN ASPART 100 UNIT/ML IV SOLN
10.0000 [IU] | Freq: Once | INTRAVENOUS | Status: AC
  Administered 2022-11-20: 10 [IU] via INTRAVENOUS
  Filled 2022-11-20: qty 0.1

## 2022-11-20 MED ORDER — SODIUM ZIRCONIUM CYCLOSILICATE 10 G PO PACK
10.0000 g | PACK | Freq: Two times a day (BID) | ORAL | Status: DC
  Administered 2022-11-20: 10 g via ORAL
  Filled 2022-11-20 (×3): qty 1

## 2022-11-20 MED ORDER — ACETAMINOPHEN 650 MG RE SUPP: 650.0000 mg | Freq: Four times a day (QID) | RECTAL | Status: DC | PRN

## 2022-11-20 MED ORDER — TRANEXAMIC ACID-NACL 1000-0.7 MG/100ML-% IV SOLN
1000.0000 mg | INTRAVENOUS | Status: AC
  Administered 2022-11-20: 1000 mg via INTRAVENOUS
  Filled 2022-11-20: qty 100

## 2022-11-20 MED ORDER — SODIUM ZIRCONIUM CYCLOSILICATE 10 G PO PACK
10.0000 g | PACK | ORAL | Status: AC
  Administered 2022-11-20: 10 g via ORAL
  Filled 2022-11-20: qty 1

## 2022-11-20 MED ORDER — FLUTICASONE FUROATE-VILANTEROL 100-25 MCG/ACT IN AEPB
1.0000 | INHALATION_SPRAY | Freq: Every day | RESPIRATORY_TRACT | Status: DC
  Administered 2022-11-21 – 2022-11-22 (×2): 1 via RESPIRATORY_TRACT
  Filled 2022-11-20: qty 28

## 2022-11-20 MED ORDER — UMECLIDINIUM BROMIDE 62.5 MCG/ACT IN AEPB
1.0000 | INHALATION_SPRAY | Freq: Every day | RESPIRATORY_TRACT | Status: DC
  Administered 2022-11-21 – 2022-11-22 (×2): 1 via RESPIRATORY_TRACT
  Filled 2022-11-20: qty 7

## 2022-11-20 MED ORDER — BUDESON-GLYCOPYRROL-FORMOTEROL 160-9-4.8 MCG/ACT IN AERO: 2.0000 | INHALATION_SPRAY | Freq: Two times a day (BID) | RESPIRATORY_TRACT | Status: DC

## 2022-11-20 MED ORDER — ATORVASTATIN CALCIUM 10 MG PO TABS
10.0000 mg | ORAL_TABLET | Freq: Every day | ORAL | Status: DC
  Administered 2022-11-20 – 2022-11-22 (×3): 10 mg via ORAL
  Filled 2022-11-20 (×3): qty 1

## 2022-11-20 NOTE — ED Triage Notes (Signed)
BIB [facility] for home for some rectal bleeding. Pt went to bathroom to urinate, felt something come out of her rectum and noticed the bright red bleeding. Pt has HX of cancer. Pt denies blood thinners. Pt is caox4, in no acute distress and was ambulatory for EMS. Vitals WNL for EMS.  125/65 75 98% RA

## 2022-11-20 NOTE — Plan of Care (Signed)

## 2022-11-20 NOTE — ED Notes (Signed)
York Cerise, MD notified of elevated K+ without known history and without hemolysis listed by lab, repeat orders obtained.

## 2022-11-20 NOTE — ED Notes (Signed)
Called Pharmacy about insulin.  They well send shortly.

## 2022-11-20 NOTE — ED Notes (Signed)
Pt ambulated to bathroom w steady gate.  urine voided. Call light within reach.

## 2022-11-20 NOTE — ED Notes (Signed)
Patient ambulatory to restroom and back to bed without incident. IVF hung. Temp repeated. No other needs identified at this time, call light within reach.

## 2022-11-20 NOTE — Plan of Care (Signed)

## 2022-11-20 NOTE — ED Notes (Signed)
K+ shift completed with Olegario Messier, RN. Patient connected to monitor, VSS, CCM in use, call light within reach. No other needs identified at this time.

## 2022-11-20 NOTE — ED Provider Notes (Signed)
[facility] Provider Note    Event Date/Time   First MD Initiated Contact with Patient 11/20/22 0131     (approximate)   History   Rectal Bleeding   HPI Linell Meldrum is a 82 y.o. female with a history of CLL managed by oncology at [facility].  She presents tonight for acute onset rectal bleeding.  She says she has never had this issue before.  She had some bowel urgency tonight and did not think much of it initially because one of the medication she takes causes diarrhea.  However when she went to wipe, she was shocked to see the bright red blood.  This has happened at least 1 more time since then and she is concerned about the volume of blood she is passing.  She is having no abdominal pain.  She feels otherwise well, perhaps with some generalized weakness or fatigue but no recent fever, chills, chest pain, shortness of breath, nausea, vomiting, nor abdominal pain.  No recent changes to her medication regimen.  She definitely does not take any blood thinners and has had no issues with GI bleeding in the past.     Physical Exam   Triage Vital Signs: ED Triage Vitals  Encounter Vitals Group     BP 11/20/22 0115 (!) 139/57     Systolic BP Percentile --      Diastolic BP Percentile --      Pulse Rate 11/20/22 0115 72     Resp 11/20/22 0115 16     Temp 11/20/22 0115 97.9 F (36.6 C)     Temp Source 11/20/22 0115 Oral     SpO2 11/20/22 0115 98 %     Weight 11/20/22 0119 63.5 kg (140 lb)     Height 11/20/22 0114 1.6 m (5\' 3" )     Head Circumference --      Peak Flow --      Pain Score 11/20/22 0113 0     Pain Loc --      Pain Education --      Exclude from Growth Chart --     Most recent vital signs: Vitals:   11/20/22 0330 11/20/22 0400  BP: (!) 151/68 (!) 140/54  Pulse: 73 69  Resp: 16 15  Temp:    SpO2:  100%    General: Awake, no obvious distress. CV:  Good peripheral perfusion.  Regular rate and rhythm. Resp:  Normal effort.  Speaking easily and comfortably, no accessory muscle usage nor intercostal retractions.   Abd:  No distention.  No tenderness to palpation of the abdomen. Other:  Bright red blood per rectum when she had a bowel movement and bedside commode, Hemoccult positive with quality control passed.   ED Results / Procedures / Treatments   Labs (all labs ordered are listed, but only abnormal results are displayed) Labs Reviewed  COMPREHENSIVE METABOLIC PANEL - Abnormal; Notable for the following components:      Result Value   Potassium 6.1 (*)    Glucose, Bld 113 (*)    Total Protein 6.2 (*)    Alkaline Phosphatase 133 (*)    All other components within normal limits  CBC - Abnormal; Notable for the following components:   WBC 258.8 (*)    RBC 3.31 (*)    Hemoglobin 8.0 (*)    MCV 117.8 (*)    MCH 24.2 (*)    MCHC 20.5 (*)    Platelets 75 (*)    All  other components within normal limits  POTASSIUM - Abnormal; Notable for the following components:   Potassium 6.2 (*)    All other components within normal limits  DIFFERENTIAL - Abnormal; Notable for the following components:   Lymphs Abs 724.5 (*)    Monocytes Absolute 3.5 (*)    Abs Immature Granulocytes 1.04 (*)    All other components within normal limits  CBG MONITORING, ED - Abnormal; Notable for the following components:   Glucose-Capillary 109 (*)    All other components within normal limits  PROTIME-INR  APTT  HEMOGLOBIN  HEMOGLOBIN  HEMOGLOBIN  POTASSIUM  POTASSIUM  POTASSIUM  POC OCCULT BLOOD, ED  CBG MONITORING, ED  TYPE AND SCREEN     EKG  ED ECG REPORT I, Loleta Rose, the attending physician, personally viewed and interpreted this ECG.  Date: 11/20/2022 EKG Time: 1:15 AM Rate: 73 Rhythm: normal sinus rhythm QRS Axis: normal Intervals: normal ST/T Wave abnormalities: Non-specific ST segment / T-wave changes, but no clear evidence of acute ischemia. Narrative Interpretation: no definitive evidence of  acute ischemia; does not meet STEMI criteria.    RADIOLOGY See hospital course for details: CTA GI bleeding protocol shows no acute abnormalities.   PROCEDURES:  Critical Care performed: Yes, see critical care procedure note(s)  .1-3 Lead EKG Interpretation  Performed by: Loleta Rose, MD Authorized by: Loleta Rose, MD     Interpretation: normal     ECG rate:  68   ECG rate assessment: normal     Rhythm: sinus rhythm     Ectopy: none     Conduction: normal   .Critical Care  Performed by: Loleta Rose, MD Authorized by: Loleta Rose, MD   Critical care provider statement:    Critical care time (minutes):  30   Critical care time was exclusive of:  Separately billable procedures and treating other patients   Critical care was necessary to treat or prevent imminent or life-threatening deterioration of the following conditions: hyperkalemia, GI bleeding in CLL patient.   Critical care was time spent personally by me on the following activities:  Development of treatment plan with patient or surrogate, evaluation of patient's response to treatment, examination of patient, obtaining history from patient or surrogate, ordering and performing treatments and interventions, ordering and review of laboratory studies, ordering and review of radiographic studies, pulse oximetry, re-evaluation of patient's condition and review of old charts     IMPRESSION / MDM / ASSESSMENT AND PLAN / ED COURSE  I reviewed the triage vital signs and the nursing notes.                              Differential diagnosis includes, but is not limited to, diverticular bleed, AV malformation, neoplasm, renal or liver failure.  Patient's presentation is most consistent with acute presentation with potential threat to life or bodily function.  Labs/studies ordered: CTA GI bleed protocol, type and screen, CMP, CBC with differential, pro time-INR, APTT (coags are obtained given the patient's cancer status  and her new onset GI bleeding), repeat potassium level given initial abnormal value, CBG.  Interventions/Medications given:  Medications  atorvastatin (LIPITOR) tablet 10 mg (has no administration in time range)  Budeson-Glycopyrrol-Formoterol 160-9-4.8 MCG/ACT AERO 2 puff (has no administration in time range)  albuterol (PROVENTIL) (2.5 MG/3ML) 0.083% nebulizer solution 2.5 mg (has no administration in time range)  acetaminophen (TYLENOL) tablet 650 mg (has no administration in time range)  Or  acetaminophen (TYLENOL) suppository 650 mg (has no administration in time range)  ondansetron (ZOFRAN) tablet 4 mg (has no administration in time range)    Or  ondansetron (ZOFRAN) injection 4 mg (has no administration in time range)  0.9 %  sodium chloride infusion (has no administration in time range)  HYDROcodone-acetaminophen (NORCO/VICODIN) 5-325 MG per tablet 1-2 tablet (has no administration in time range)  morphine (PF) 2 MG/ML injection 2 mg (has no administration in time range)  sodium chloride 0.9 % bolus 500 mL (0 mLs Intravenous Stopped 11/20/22 0410)  tranexamic acid (CYKLOKAPRON) IVPB 1,000 mg (0 mg Intravenous Stopped 11/20/22 0344)  sodium zirconium cyclosilicate (LOKELMA) packet 10 g (10 g Oral Given 11/20/22 0332)  insulin aspart (novoLOG) injection 5 Units (5 Units Intravenous Given 11/20/22 0331)    And  dextrose 50 % solution 50 mL (50 mLs Intravenous Given 11/20/22 0329)  iohexol (OMNIPAQUE) 350 MG/ML injection 100 mL (100 mLs Intravenous Contrast Given 11/20/22 0317)    (Note:  hospital course my include additional interventions and/or labs/studies not listed above.)  Patient is generally well-appearing despite her chronic issues and her acute complaint.  Labs are notable for an initial potassium of 6.1 which I repeated to see if it was a false positive and on repeat her potassium was 6.2.  Renal function is normal and electrolytes are otherwise essentially normal.  CBC is  consistent with prior with a WBC count of about 259.  However her hemoglobin has dropped 2 points to 8.0 and she has a degree of thrombocytopenia with 75,000 platelets which is lower than she has had in the past.  Coagulation studies are essentially normal.  I am treating the hyperkalemia with normal saline 500 mL IV fluids, Lokelma 10 g p.o., D50/regular insulin 5 units IV.  I will treat the acute GI bleed with 1000 mg of TXA IV given that she has no contraindications.  Ordering CTA GI bleed protocol to try to identify source of the bleed but I anticipate it will be a negative study.  Given the patient's ongoing GI bleed and bowel urgency with a drop of hemoglobin and her status as a CLL patient, believe she would benefit from hospitalization and I will consult the hospitalist for admission in the absence of any acute/emergent surgical issues found on the CTA.  Patient and her daughter who is at bedside understand and agree with the plan.  Of note, despite the patient's hyperkalemia she does not have any EKG changes at this time and I will attempt to both shift the potassium intracellular as well as remove it with the [facility].   The patient is on the cardiac monitor to evaluate for evidence of arrhythmia and/or significant heart rate changes.   Clinical Course as of 11/20/22 0502  Sat Nov 20, 2022  0353 CT ANGIO GI BLEED I viewed and interpreted the patient's CTA GI bleed protocol and see no obvious source of the bleeding.  Radiology report confirms no acute abnormalities or evidence of the source of the bleed.  Consulting the hospitalist for admission as per previous discussion above. [CF]  0430 Consulted by phone with Dr. Para March with the hospitalist service.  We discussed the case in detail and she will admit the patient for further management. [CF]    Clinical Course User Index [CF] Loleta Rose, MD     FINAL CLINICAL IMPRESSION(S) / ED DIAGNOSES   Final diagnoses:  Acute lower GI  bleeding  Thrombocytopenia (HCC)  Hyperkalemia  CLL (chronic lymphocytic leukemia) (HCC)     Rx / DC Orders   ED Discharge Orders     None        Note:  This document was prepared using Dragon voice recognition software and may include unintentional dictation errors.   Loleta Rose, MD 11/20/22 [phone]

## 2022-11-20 NOTE — Assessment & Plan Note (Signed)
Will hold antihypertensives in the setting of acute GI bleed

## 2022-11-20 NOTE — ED Notes (Signed)
Pt ambulated to bathroom with a steady gate and minimal assist. Pt returned to bed with call light within reach.

## 2022-11-20 NOTE — Assessment & Plan Note (Signed)
-   Continue home meds °

## 2022-11-20 NOTE — Progress Notes (Signed)
PROGRESS NOTE    Katherine [name]  ZOX:[id number] DOB: Dec 22, 1940 DOA: 11/20/2022 PCP: Marjie Skiff, NP  [id number]  LOS: 0 days   Brief hospital course:   Assessment & Plan: Katherine [name] is a 82 y.o. female with medical history significant for COPD, CLL, followed locally by oncology, previously on IVIG which she self discontinued due to intolerance from diarrhea, last seen by oncology on 10/29/2022 who presents to the ED with rectal bleeding.    * Hematochezia --likely 2/2 diverticular bleeds.  CTA bleeding scan on presentation did not reveal bleeding source. --GI consulted, no plan for luminal study for now --monitor for bleeding --STAT CT angio GI bleed scan if large amount of bleeding seen.  Acute blood loss anemia --Hgb 8 on presentation.  Was 10.1 3 weeks ago. --monitor and transfuse to keep Hgb >7.  Need irradiated blood.  Hyperkalemia or pseudohyperkalemia --K+ 6.1 on presentation.  Increased to 6.6 after D5 insulin temporizing.  Due to WBC high of 258, this may be pseudohyperkalemia caused by lysed WBC releasing potassium. --potassium level q6h, special instruction to lab to "not to centrifuge right away, let blood clot and then centrifuge to get serum" --cont Lokelma 10g BID for now  Thrombocytopenia, chronic --likely due CLL  CLL (chronic lymphocytic leukemia) (HCC) Splenomegaly --WBC 258.  Has been this high level for the past year. --oncology consult today  Centrilobular emphysema (HCC) Not acutely exacerbated Continue home inhalers DuoNebs as needed  Essential hypertension Will hold antihypertensives in the setting of acute GI bleed   DVT prophylaxis: SCD/Compression stockings Code Status: Full code  Family Communication: sister updated at bedside today Level of care: Progressive Dispo:   The patient is from: home Anticipated d/c is to: home Anticipated d/c date is: 1-2 days   Subjective and Interval History:  Pt reported feeling  weak.  No abdominal pain.   Objective: Vitals:   11/20/22 1030 11/20/22 1055 11/20/22 1330 11/20/22 1516  BP: 137/64  120/64 137/66  Pulse: 79  75 74  Resp: 16  15 16   Temp:  98 F (36.7 C)    TempSrc:  Oral    SpO2: 94%  96% 90%  Weight:      Height:        Intake/Output Summary (Last 24 hours) at 11/20/2022 1617 Last data filed at 11/20/2022 1500 Gross per 24 hour  Intake 1000 ml  Output 1402 ml  Net -402 ml   Filed Weights   11/20/22 0119  Weight: 63.5 kg    Examination:   Constitutional: NAD, AAOx3 HEENT: conjunctivae and lids normal, EOMI CV: No cyanosis.   RESP: normal respiratory effort, on RA Extremities: Left forearm with surgical dressing SKIN: warm, dry Neuro: II - XII grossly intact.   Psych: Normal mood and affect.  Appropriate judgement and reason   Data Reviewed: I have personally reviewed labs and imaging studies  Time spent: 50 minutes  Darlin Priestly, MD [facility] If 7PM-7AM, please contact night-coverage 11/20/2022, 4:17 PM

## 2022-11-20 NOTE — Assessment & Plan Note (Addendum)
Splenomegaly WBC to 58,000, platelets 75,000 Oncology consult in the a.m.

## 2022-11-20 NOTE — Assessment & Plan Note (Signed)
Acute blood loss anemia Thrombocytopenia Hemoglobin 10-> 8, platelets 75,000 Serial H&H and transfuse for hemoglobin under 7 IV hydration GI consulted

## 2022-11-20 NOTE — Assessment & Plan Note (Signed)
Not acutely exacerbated Continue home inhalers.  DuoNebs as needed 

## 2022-11-20 NOTE — H&P (Signed)
History and Physical    Patient: Katherine [name] YQM:[id number] DOB: 04/06/1940 DOA: 11/20/2022 DOS: the patient was seen and examined on 11/20/2022 PCP: Marjie Skiff, NP  Patient coming from: Home  Chief Complaint:  Chief Complaint  Patient presents with   Rectal Bleeding    HPI: Katherine [name] is a 82 y.o. female with medical history significant for COPD, CLL, followed locally by oncology, previously on IVIG which she self discontinued due to intolerance from diarrhea, last seen by oncology on 10/29/2022 who presents to the ED with rectal bleeding.  Patient reports that her baseline state of health which is chronic fatigue when she felt a sense of rectal urgency and on using the bathroom passed blood in the commode.  She had another episode of rectal bleeding following arrival in the ED.  She denies abdominal pain, lightheadedness or shortness of breath beyond her baseline. ED course and data Review: Vitals within normal limits CBC notable for hemoglobin 8 down from 10.1 about 3 weeks prior WBC 258,000, down from 3 66,000 Potassium 6.1 with normal creatinine Platelets 75,000, down from 105,000 INR 1.1 Potassium 6.2 EKG, personally viewed and interpreted with sinus at 73 with nonspecific T wave abnormalities CT angio GI bleed protocol with no focal site of active GI bleed.  Noted splenic enlargement Patient treated with fluid bolus and tranexamic acid IV Hyperkalemia treated with D50 and insulin, and [facility] Hospitalist consulted for admission   Review of Systems: As mentioned in the history of present illness. All other systems reviewed and are negative.  Past Medical History:  Diagnosis Date   Allergy    Anxiety    Depression    GERD (gastroesophageal reflux disease)    Hemorrhoids    Hypertension    Leukemia, lymphoid (HCC)    CLL   Lobar pneumonia (HCC)    Osteopenia    Personal history of chemotherapy    Past Surgical History:  Procedure Laterality Date    ABDOMINAL HYSTERECTOMY     APPENDECTOMY     BREAST BIOPSY Left    bx x 3-neg   COLON SURGERY     sigmoid resection   COLONOSCOPY     2007, 2012   COLONOSCOPY WITH PROPOFOL N/A 09/17/2015   Procedure: COLONOSCOPY WITH PROPOFOL;  Surgeon: Earline Mayotte, MD;  Location: [facility] ENDOSCOPY;  Service: Endoscopy;  Laterality: N/A;   OOPHORECTOMY     SPINE SURGERY     L4-5   Social History:  reports that she quit smoking about 20 months ago. Her smoking use included cigarettes. She started smoking about 36 years ago. She has a 35 pack-year smoking history. She has never used smokeless tobacco. She reports that she does not drink alcohol and does not use drugs.  Allergies  Allergen Reactions   Augmentin [Amoxicillin-Pot Clavulanate] Swelling    tongue   Biaxin [Clarithromycin] Swelling and Rash    tongue    Family History  Problem Relation Age of Onset   Osteoporosis Mother    Hypertension Father    Heart attack Father    Stroke Maternal Grandfather    Breast cancer Sister 22    Prior to Admission medications   Medication Sig Start Date End Date Taking? Authorizing Provider  acetaminophen (TYLENOL) 325 MG tablet Take by mouth. 03/13/21   [provider]  albuterol (VENTOLIN HFA) 108 (90 Base) MCG/ACT inhaler TAKE 2 PUFFS BY MOUTH EVERY 6 HOURS AS NEEDED FOR WHEEZE OR SHORTNESS OF BREATH 08/04/22   Jayme Cloud,  Katherine Basset, MD  atorvastatin (LIPITOR) 10 MG tablet TAKE 1 TABLET BY MOUTH EVERY DAY 12/15/21   Cannady, Jolene T, NP  benazepril (LOTENSIN) 40 MG tablet Take 1 tablet (40 mg total) by mouth daily. 06/10/22   Cannady, Corrie Dandy T, NP  BREZTRI AEROSPHERE 160-9-4.8 MCG/ACT AERO INHALE 2 PUFFS INTO THE LUNGS IN THE MORNING AND AT BEDTIME. 08/04/22   Salena Saner, MD  Budeson-Glycopyrrol-Formoterol (BREZTRI AEROSPHERE) 160-9-4.8 MCG/ACT AERO Inhale 2 puffs into the lungs in the morning and at bedtime. 10/06/22   Salena Saner, MD  diphenoxylate-atropine (LOMOTIL) 2.5-0.025 MG  tablet Take 1 tablet by mouth 4 (four) times daily as needed for diarrhea or loose stools. Take it along with immodium 05/31/22   Earna Coder, MD  hydrocortisone (ANUSOL-HC) 25 MG suppository Place 1 suppository (25 mg total) rectally 2 (two) times daily. 12/09/21   Cannady, Corrie Dandy T, NP  LORazepam (ATIVAN) 1 MG tablet Take 0.5 tablets (0.5 mg total) by mouth 2 (two) times daily as needed for anxiety. 06/14/22   Cannady, Corrie Dandy T, NP  mometasone (NASONEX) 50 MCG/ACT nasal spray USE 1 SPRAY TO EACH NOSTRIL TWICE A DAY 04/09/22   Salena Saner, MD  Multiple Vitamins-Minerals (MULTIVITAMIN WITH MINERALS) tablet Take 1 tablet by mouth daily.    [provider]  PARoxetine (PAXIL) 30 MG tablet Take 1 tablet (30 mg total) by mouth daily. 06/10/22   Cannady, Corrie Dandy T, NP  vitamin C (ASCORBIC ACID) 250 MG tablet Take 250 mg by mouth daily.    [provider]  vitamin E 1000 UNIT capsule Take by mouth.    [provider]  zanubrutinib (BRUKINSA) 80 MG capsule Take 2 capsules (160 mg total) by mouth daily. 11/01/22   Earna Coder, MD    Physical Exam: Vitals:   11/20/22 0230 11/20/22 0300 11/20/22 0330 11/20/22 0400  BP: 132/67 (!) 143/73 (!) 151/68 (!) 140/54  Pulse: 68 71 73 69  Resp: 18 15 16 15   Temp:      TempSrc:      SpO2:    100%  Weight:      Height:       Physical Exam Vitals and nursing note reviewed.  Constitutional:      General: She is not in acute distress.    Comments: Frail-appearing elderly female in no distress  HENT:     Head: Normocephalic and atraumatic.  Cardiovascular:     Rate and Rhythm: Normal rate and regular rhythm.     Heart sounds: Normal heart sounds.  Pulmonary:     Effort: Pulmonary effort is normal.     Breath sounds: Normal breath sounds.  Abdominal:     Palpations: Abdomen is soft.     Tenderness: There is no abdominal tenderness.  Neurological:     Mental Status: Mental status is at baseline.     Labs  on Admission: I have personally reviewed following labs and imaging studies  CBC: Recent Labs  Lab 11/20/22 0117  WBC 258.8*  NEUTROABS 4.7  HGB 8.0*  HCT 39.0  MCV 117.8*  PLT 75*   Basic Metabolic Panel: Recent Labs  Lab 11/20/22 0117 11/20/22 0217  NA 140  --   K 6.1* 6.2*  CL 106  --   CO2 24  --   GLUCOSE 113*  --   BUN 22  --   CREATININE 0.86  --   CALCIUM 9.2  --    GFR: Estimated Creatinine Clearance: 48  mL/min (by C-G formula based on SCr of 0.86 mg/dL). Liver Function Tests: Recent Labs  Lab 11/20/22 0117  AST 21  ALT 12  ALKPHOS 133*  BILITOT 1.0  PROT 6.2*  ALBUMIN 4.2   No results for input(s): "LIPASE", "AMYLASE" in the last 168 hours. No results for input(s): "AMMONIA" in the last 168 hours. Coagulation Profile: Recent Labs  Lab 11/20/22 0118  INR 1.1   Cardiac Enzymes: No results for input(s): "CKTOTAL", "CKMB", "CKMBINDEX", "TROPONINI" in the last 168 hours. BNP (last 3 results) No results for input(s): "PROBNP" in the last 8760 hours. HbA1C: No results for input(s): "HGBA1C" in the last 72 hours. CBG: Recent Labs  Lab 11/20/22 0329  GLUCAP 98   Lipid Profile: No results for input(s): "CHOL", "HDL", "LDLCALC", "TRIG", "CHOLHDL", "LDLDIRECT" in the last 72 hours. Thyroid Function Tests: No results for input(s): "TSH", "T4TOTAL", "FREET4", "T3FREE", "THYROIDAB" in the last 72 hours. Anemia Panel: No results for input(s): "VITAMINB12", "FOLATE", "FERRITIN", "TIBC", "IRON", "RETICCTPCT" in the last 72 hours. Urine analysis:    Component Value Date/Time   COLORURINE AMBER (A) 04/20/2020 2207   APPEARANCEUR HAZY (A) 04/20/2020 2207   APPEARANCEUR Cloudy (A) 06/07/2019 1314   LABSPEC 1.020 04/20/2020 2207   PHURINE 5.0 04/20/2020 2207   GLUCOSEU NEGATIVE 04/20/2020 2207   HGBUR NEGATIVE 04/20/2020 2207   BILIRUBINUR NEGATIVE 04/20/2020 2207   BILIRUBINUR Negative 06/07/2019 1314   KETONESUR 5 (A) 04/20/2020 2207   PROTEINUR 30  (A) 04/20/2020 2207   NITRITE NEGATIVE 04/20/2020 2207   LEUKOCYTESUR SMALL (A) 04/20/2020 2207    Radiological Exams on Admission: CT ANGIO GI BLEED  Result Date: 11/20/2022 CLINICAL DATA:  Acute GI bleeding.  Hematochezia. EXAM: CTA ABDOMEN AND PELVIS WITHOUT AND WITH CONTRAST TECHNIQUE: Multidetector CT imaging of the abdomen and pelvis was performed using the standard protocol during bolus administration of intravenous contrast. Multiplanar reconstructed images and MIPs were obtained and reviewed to evaluate the vascular anatomy. RADIATION DOSE REDUCTION: This exam was performed according to the departmental dose-optimization program which includes automated exposure control, adjustment of the mA and/or kV according to patient size and/or use of iterative reconstruction technique. CONTRAST:  OMNIPAQUE IOHEXOL 350 MG/ML SOLN COMPARISON:  CT abdomen and pelvis 06/01/2019 FINDINGS: Unenhanced images demonstrate diffuse calcification of the aorta and iliac arteries. Cholelithiasis without evidence of acute cholecystitis. Calcification in the spleen may be dystrophic or representative of old insult. VASCULAR Aorta: Normal caliber aorta without aneurysm, dissection, vasculitis or significant stenosis. Celiac: Patent without evidence of aneurysm, dissection, vasculitis or significant stenosis. SMA: Patent without evidence of aneurysm, dissection, vasculitis or significant stenosis. Renals: Both renal arteries are patent without evidence of aneurysm, dissection, vasculitis, fibromuscular dysplasia or significant stenosis. IMA: Patent without evidence of aneurysm, dissection, vasculitis or significant stenosis. Inflow: Patent without evidence of aneurysm, dissection, vasculitis or significant stenosis. Proximal Outflow: Bilateral common femoral and visualized portions of the superficial and profunda femoral arteries are patent without evidence of aneurysm, dissection, vasculitis or significant stenosis.  Veins: No obvious venous abnormality within the limitations of this arterial phase study. Review of the MIP images confirms the above findings. NON-VASCULAR Lower chest: Emphysematous changes and scarring in the lung bases. Hepatobiliary: No focal liver lesions. Cholelithiasis without evidence of acute cholecystitis. No bile duct dilatation. Pancreas: Unremarkable. No pancreatic ductal dilatation or surrounding inflammatory changes. Spleen: Prominent splenic enlargement. Several subcentimeter focal lesions likely representing cysts or hemangiomas. Adrenals/Urinary Tract: Adrenal glands are unremarkable. Kidneys are normal, without renal calculi, focal lesion,  or hydronephrosis. Bladder is unremarkable. Stomach/Bowel: Stomach, small bowel, and colon are not abnormally distended. No wall thickening or inflammatory changes. No focal lesions. No abnormal intraluminal contrast extravasation is demonstrated. A focal site of active gastrointestinal hemorrhage is not demonstrated. Lymphatic: No significant lymphadenopathy. Reproductive: Status post hysterectomy. No adnexal masses. Other: No abdominal wall hernia or abnormality. No abdominopelvic ascites. Musculoskeletal: Degenerative changes in the spine. Lumbar scoliosis convex towards the right. No acute bony abnormalities. IMPRESSION: VASCULAR Prominent aortoiliac atherosclerosis although vessels remain patent. No evidence of aneurysm or dissection of the major abdominal vasculature. NON-VASCULAR 1. No focal site of active gastrointestinal hemorrhage identified. 2. Cholelithiasis without evidence of acute cholecystitis. 3. Splenic enlargement. 4. Emphysematous changes and scarring in the lung bases. Electronically Signed   By: Burman Nieves M.D.   On: 11/20/2022 03:46     Data Reviewed: Relevant notes from primary care and specialist visits, past discharge summaries as available in EHR, including Care Everywhere. Prior diagnostic testing as pertinent to current  admission diagnoses Updated medications and problem lists for reconciliation ED course, including vitals, labs, imaging, treatment and response to treatment Triage notes, nursing and pharmacy notes and ED provider's notes Notable results as noted in HPI   Assessment and Plan: * Hematochezia Acute blood loss anemia Thrombocytopenia Hemoglobin 10-> 8, platelets 75,000 Serial H&H and transfuse for hemoglobin under 7 IV hydration GI consulted  Hyperkalemia Potassium 6.1> 6.2 Treated in the ED with dextrose and insulin and Lokelma Continue to monitor and correct as needed  CLL (chronic lymphocytic leukemia) (HCC) Splenomegaly Oncology consult in the a.m.  Centrilobular emphysema (HCC) Not acutely exacerbated Continue home inhalers DuoNebs as needed  Anxiety Continue home meds  Essential hypertension Will hold antihypertensives in the setting of acute GI bleed        DVT prophylaxis: SCDs  Consults: GI  Advance Care Planning: full code, discussed with patient with daughter at bedside  Family Communication: Daughter at bedside  Disposition Plan: Back to previous home environment  Severity of Illness: The appropriate patient status for this patient is INPATIENT. Inpatient status is judged to be reasonable and necessary in order to provide the required intensity of service to ensure the patient's safety. The patient's presenting symptoms, physical exam findings, and initial radiographic and laboratory data in the context of their chronic comorbidities is felt to place them at high risk for further clinical deterioration. Furthermore, it is not anticipated that the patient will be medically stable for discharge from the hospital within 2 midnights of admission.   * I certify that at the point of admission it is my clinical judgment that the patient will require inpatient hospital care spanning beyond 2 midnights from the point of admission due to high intensity of  service, high risk for further deterioration and high frequency of surveillance required.*  Author: Andris Baumann, MD 11/20/2022 4:32 AM  For on call review [email or web address].

## 2022-11-20 NOTE — ED Notes (Signed)
Advised nurse that patient has ready bed 

## 2022-11-20 NOTE — ED Notes (Signed)
York Cerise, MD notified of critical WBC of 258.8.

## 2022-11-20 NOTE — Assessment & Plan Note (Signed)
Potassium 6.1> 6.2 Treated in the ED with dextrose and insulin and Lokelma Continue to monitor and correct as needed

## 2022-11-20 NOTE — ED Notes (Addendum)
Pt ambulated to bedside commode. Voided . Call light within reach.

## 2022-11-20 NOTE — ED Notes (Signed)
Patient ambulatory to bedside commode in room. Hat placed in toilet to obtain stool sample.

## 2022-11-20 NOTE — ED Notes (Signed)
Pt requested her PRN albuterol nebulizer.

## 2022-11-21 DIAGNOSIS — K921 Melena: Secondary | ICD-10-CM | POA: Diagnosis not present

## 2022-11-21 DIAGNOSIS — K625 Hemorrhage of anus and rectum: Secondary | ICD-10-CM

## 2022-11-21 DIAGNOSIS — K922 Gastrointestinal hemorrhage, unspecified: Principal | ICD-10-CM

## 2022-11-21 LAB — ABO/RH: ABO/RH(D): O NEG

## 2022-11-21 LAB — PREPARE RBC (CROSSMATCH)

## 2022-11-21 LAB — POTASSIUM: Potassium: 3.3 mmol/L — ABNORMAL LOW (ref 3.5–5.1)

## 2022-11-21 LAB — GLUCOSE, CAPILLARY: Glucose-Capillary: 117 mg/dL — ABNORMAL HIGH (ref 70–99)

## 2022-11-21 LAB — HEMOGLOBIN: Hemoglobin: 7.1 g/dL — ABNORMAL LOW (ref 12.0–15.0)

## 2022-11-21 MED ORDER — SODIUM CHLORIDE 0.9% IV SOLUTION: Freq: Once | INTRAVENOUS | Status: DC

## 2022-11-21 MED ORDER — INFLUENZA VAC A&B SURF ANT ADJ 0.5 ML IM SUSY
0.5000 mL | PREFILLED_SYRINGE | INTRAMUSCULAR | Status: DC
  Filled 2022-11-21 (×2): qty 0.5

## 2022-11-21 MED ORDER — SODIUM CHLORIDE 0.9 % IV BOLUS
500.0000 mL | Freq: Once | INTRAVENOUS | Status: AC
  Administered 2022-11-21: 500 mL via INTRAVENOUS

## 2022-11-21 NOTE — Consult Note (Signed)
Wyline Mood , MD [address], [place], [place], [address] [address], [place], [place], [address] Phone: [phone]  Fax: [phone]  Consultation  Referring Provider:   Dr. Para March Primary Care Physician:  Marjie Skiff, NP Primary Gastroenterologist:  None      Reason for Consultation:     GI bleed  Date of Admission:  11/20/2022 Date of Consultation:  11/21/2022         HPI:   Katherine [name] is a 82 y.o. female has a history of CLL, COPD presented emergency room with 1 single epiosde of a large painless  rectal bleeding at home , non after, associated with dizziness. CT angiogram showed no active bleeding.  I was consulted to see for the GI bleed.  Hemoglobin this morning 7 g.  Was 10 g 3 weeks back.  On presentation the patient had a potassium of over 6 which is being evaluated to rule out pseudohyperkalemia.  I discussed with Dr. Fran Lowes yesterday and we held back on GI evaluation till the hyperkalemia issue has been sorted out.  Colonoscopy in 2017 by Dr. Lemar Livings showed diverticulosis in the sigmoid colon otherwise with normal  No similar episodes in the past , no nsaid use, no abdominal pain , hematemesis , no further bowel movemnets.   Past Medical History:  Diagnosis Date   Allergy    Anxiety    Depression    GERD (gastroesophageal reflux disease)    Hemorrhoids    Hypertension    Leukemia, lymphoid (HCC)    CLL   Lobar pneumonia (HCC)    Osteopenia    Personal history of chemotherapy     Past Surgical History:  Procedure Laterality Date   ABDOMINAL HYSTERECTOMY     APPENDECTOMY     BREAST BIOPSY Left    bx x 3-neg   COLON SURGERY     sigmoid resection   COLONOSCOPY     2007, 2012   COLONOSCOPY WITH PROPOFOL N/A 09/17/2015   Procedure: COLONOSCOPY WITH PROPOFOL;  Surgeon: Earline Mayotte, MD;  Location: [facility] ENDOSCOPY;  Service: Endoscopy;  Laterality: N/A;   OOPHORECTOMY     SPINE SURGERY     L4-5    Prior to Admission  medications   Medication Sig Start Date End Date Taking? Authorizing Provider  acetaminophen (TYLENOL) 325 MG tablet Take by mouth. 03/13/21  Yes [provider]  albuterol (VENTOLIN HFA) 108 (90 Base) MCG/ACT inhaler TAKE 2 PUFFS BY MOUTH EVERY 6 HOURS AS NEEDED FOR WHEEZE OR SHORTNESS OF BREATH 08/04/22  Yes Salena Saner, MD  atorvastatin (LIPITOR) 10 MG tablet TAKE 1 TABLET BY MOUTH EVERY DAY 12/15/21  Yes Cannady, Jolene T, NP  benazepril (LOTENSIN) 40 MG tablet Take 1 tablet (40 mg total) by mouth daily. 06/10/22  Yes Cannady, Jolene T, NP  BREZTRI AEROSPHERE 160-9-4.8 MCG/ACT AERO INHALE 2 PUFFS INTO THE LUNGS IN THE MORNING AND AT BEDTIME. 08/04/22  Yes Salena Saner, MD  diphenoxylate-atropine (LOMOTIL) 2.5-0.025 MG tablet Take 1 tablet by mouth 4 (four) times daily as needed for diarrhea or loose stools. Take it along with immodium 05/31/22  Yes Earna Coder, MD  hydrocortisone (ANUSOL-HC) 25 MG suppository Place 1 suppository (25 mg total) rectally 2 (two) times daily. 12/09/21  Yes Cannady, Jolene T, NP  LORazepam (ATIVAN) 1 MG tablet Take 0.5 tablets (0.5 mg total) by mouth 2 (two) times daily as needed for anxiety. 06/14/22  Yes Cannady,  Jolene T, NP  mometasone (NASONEX) 50 MCG/ACT nasal spray USE 1 SPRAY TO EACH NOSTRIL TWICE A DAY 04/09/22  Yes Salena Saner, MD  Multiple Vitamins-Minerals (MULTIVITAMIN WITH MINERALS) tablet Take 1 tablet by mouth daily.   Yes [provider]  PARoxetine (PAXIL) 30 MG tablet Take 1 tablet (30 mg total) by mouth daily. 06/10/22  Yes Cannady, Jolene T, NP  vitamin C (ASCORBIC ACID) 250 MG tablet Take 250 mg by mouth daily.   Yes [provider]  vitamin E 1000 UNIT capsule Take by mouth.   Yes [provider]  zanubrutinib (BRUKINSA) 80 MG capsule Take 2 capsules (160 mg total) by mouth daily. 11/01/22  Yes Earna Coder, MD    Family History  Problem Relation Age of Onset   Osteoporosis  Mother    Hypertension Father    Heart attack Father    Stroke Maternal Grandfather    Breast cancer Sister 63     Social History   Tobacco Use   Smoking status: Former    Current packs/day: 0.00    Average packs/day: 1 pack/day for 35.0 years (35.0 ttl pk-yrs)    Types: Cigarettes    Start date: 02/22/1986    Quit date: 02/22/2021    Years since quitting: 1.7   Smokeless tobacco: Never  Vaping Use   Vaping status: Never Used  Substance Use Topics   Alcohol use: No    Alcohol/week: 0.0 standard drinks of alcohol   Drug use: No    Allergies as of 11/20/2022 - Review Complete 11/20/2022  Allergen Reaction Noted   Augmentin [amoxicillin-pot clavulanate] Swelling 07/18/2014   Biaxin [clarithromycin] Swelling and Rash 07/18/2014    Review of Systems:    All systems reviewed and negative except where noted in HPI.   Physical Exam:  Vital signs in last 24 hours: Temp:  [97.5 F (36.4 C)-98.6 F (37 C)] 97.8 F (36.6 C) (09/29 0808) Pulse Rate:  [68-80] 69 (09/29 0808) Resp:  [15-20] 16 (09/29 0808) BP: (95-145)/(46-77) 95/77 (09/29 0808) SpO2:  [90 %-97 %] 93 % (09/29 0808) Last BM Date : 11/20/22 General:   Pleasant, cooperative in NAD Head:  Normocephalic and atraumatic. Eyes:   No icterus.   Conjunctiva pink. PERRLA. Ears:  Normal auditory acuity. Neck:  Supple; no masses or thyroidomegaly Lungs: Respirations even and unlabored. Lungs clear to auscultation bilaterally.   No wheezes, crackles, or rhonchi.  Heart:  Regular rate and rhythm;  Without murmur, clicks, rubs or gallops Abdomen:  Soft, nondistended, nontender. Normal bowel sounds. No appreciable masses or hepatomegaly.  No rebound or guarding.  Neurologic:  Alert and oriented x3;  grossly normal neurologically. Psych:  Alert and cooperative. Normal affect.  LAB RESULTS: Recent Labs    11/20/22 0117 11/20/22 0656 11/20/22 1320 11/21/22 0509  WBC 258.8*  --   --   --   HGB 8.0* 7.5* 7.4* 7.1*  HCT 39.0   --   --   --   PLT 75*  --   --   --    BMET Recent Labs    11/20/22 0117 11/20/22 0217 11/20/22 0656 11/20/22 1650  NA 140  --   --   --   K 6.1* 6.2* 6.6* 3.3*  CL 106  --   --   --   CO2 24  --   --   --   GLUCOSE 113*  --   --   --   BUN 22  --   --   --  CREATININE 0.86  --   --   --   CALCIUM 9.2  --   --   --    LFT Recent Labs    11/20/22 0117  PROT 6.2*  ALBUMIN 4.2  AST 21  ALT 12  ALKPHOS 133*  BILITOT 1.0   PT/INR Recent Labs    11/20/22 0118  LABPROT 14.0  INR 1.1    STUDIES: CT ANGIO GI BLEED  Result Date: 11/20/2022 CLINICAL DATA:  Acute GI bleeding.  Hematochezia. EXAM: CTA ABDOMEN AND PELVIS WITHOUT AND WITH CONTRAST TECHNIQUE: Multidetector CT imaging of the abdomen and pelvis was performed using the standard protocol during bolus administration of intravenous contrast. Multiplanar reconstructed images and MIPs were obtained and reviewed to evaluate the vascular anatomy. RADIATION DOSE REDUCTION: This exam was performed according to the departmental dose-optimization program which includes automated exposure control, adjustment of the mA and/or kV according to patient size and/or use of iterative reconstruction technique. CONTRAST:  OMNIPAQUE IOHEXOL 350 MG/ML SOLN COMPARISON:  CT abdomen and pelvis 06/01/2019 FINDINGS: Unenhanced images demonstrate diffuse calcification of the aorta and iliac arteries. Cholelithiasis without evidence of acute cholecystitis. Calcification in the spleen may be dystrophic or representative of old insult. VASCULAR Aorta: Normal caliber aorta without aneurysm, dissection, vasculitis or significant stenosis. Celiac: Patent without evidence of aneurysm, dissection, vasculitis or significant stenosis. SMA: Patent without evidence of aneurysm, dissection, vasculitis or significant stenosis. Renals: Both renal arteries are patent without evidence of aneurysm, dissection, vasculitis, fibromuscular dysplasia or significant  stenosis. IMA: Patent without evidence of aneurysm, dissection, vasculitis or significant stenosis. Inflow: Patent without evidence of aneurysm, dissection, vasculitis or significant stenosis. Proximal Outflow: Bilateral common femoral and visualized portions of the superficial and profunda femoral arteries are patent without evidence of aneurysm, dissection, vasculitis or significant stenosis. Veins: No obvious venous abnormality within the limitations of this arterial phase study. Review of the MIP images confirms the above findings. NON-VASCULAR Lower chest: Emphysematous changes and scarring in the lung bases. Hepatobiliary: No focal liver lesions. Cholelithiasis without evidence of acute cholecystitis. No bile duct dilatation. Pancreas: Unremarkable. No pancreatic ductal dilatation or surrounding inflammatory changes. Spleen: Prominent splenic enlargement. Several subcentimeter focal lesions likely representing cysts or hemangiomas. Adrenals/Urinary Tract: Adrenal glands are unremarkable. Kidneys are normal, without renal calculi, focal lesion, or hydronephrosis. Bladder is unremarkable. Stomach/Bowel: Stomach, small bowel, and colon are not abnormally distended. No wall thickening or inflammatory changes. No focal lesions. No abnormal intraluminal contrast extravasation is demonstrated. A focal site of active gastrointestinal hemorrhage is not demonstrated. Lymphatic: No significant lymphadenopathy. Reproductive: Status post hysterectomy. No adnexal masses. Other: No abdominal wall hernia or abnormality. No abdominopelvic ascites. Musculoskeletal: Degenerative changes in the spine. Lumbar scoliosis convex towards the right. No acute bony abnormalities. IMPRESSION: VASCULAR Prominent aortoiliac atherosclerosis although vessels remain patent. No evidence of aneurysm or dissection of the major abdominal vasculature. NON-VASCULAR 1. No focal site of active gastrointestinal hemorrhage identified. 2. Cholelithiasis  without evidence of acute cholecystitis. 3. Splenic enlargement. 4. Emphysematous changes and scarring in the lung bases. Electronically Signed   By: Burman Nieves M.D.   On: 11/20/2022 03:46      Impression / Plan:   Katherine [name] is a 82 y.o. y/o female  History of sigmoid diverticulosis as per colonoscopy in 2017, CLL follows with oncology present to the emergency room with rectal bleeding and hyperkalemia.  CT angiogram showed no active bleeding.  I been consulted for the GI bleed.  Potassium is 3.3 yesterday.  Being evaluated by hematology for pseudohyperkalemia.  The presentation is very suggestive of diverticular bleed. Options provided of not doing any procedures vs a colonoscopy either in hospital or as outpatient. She wishes to discuss with Dr Donneta Romberg her oncologist before making the decision.   Thank you for involving me in the care of this patient.      LOS: 1 day   Wyline Mood, MD  11/21/2022, 10:05 AM

## 2022-11-21 NOTE — Progress Notes (Signed)
PROGRESS NOTE    Katherine [name]  WUJ:[id number] DOB: 1940/09/21 DOA: 11/20/2022 PCP: Marjie Skiff, NP  [id number]/[id number]-AA  LOS: 1 day   Brief hospital course:   Katherine [name] is a 82 y.o. female with medical history significant for COPD, CLL, followed locally by oncology, previously on IVIG which she self discontinued due to intolerance from diarrhea, last seen by oncology on 10/29/2022 who presents to the ED with rectal bleeding.    * Hematochezia --likely 2/2 diverticular bleeds.  CTA bleeding scan on presentation did not reveal bleeding source. --GI consulted, pt wishes to discuss with Dr Donneta Romberg her oncologist before making the decision for luminal study. --monitor for bleeding --STAT CT angio GI bleed scan if large amount of bleeding seen.  Acute blood loss anemia --Hgb 8 on presentation.  Was 10.1 3 weeks ago. --monitor and transfuse to keep Hgb >7.  Need irradiated blood. --1u pRBC today for Hgb 7.1 and orthostasis.  Pseudohyperkalemia --K+ 6.1 on presentation.  Increased to 6.6 after D5 insulin temporizing.  Due to WBC high of 258, this may be pseudohyperkalemia caused by lysed WBC releasing potassium. --potassium lab ordered with special instruction to lab to "not to centrifuge right away, let blood clot and then centrifuge to get serum".  Repeat potassium down to 3.3. --d/c Lokelma --will not supplement for now  Orthostatic hypotension --BP dropped to 77/55 after ambulating to bathroom, with lightheadedness.  After lying down, BP improved to 90's. --NS 500 ml bolus --1u pRBC  Thrombocytopenia, chronic --likely due CLL  CLL (chronic lymphocytic leukemia) (HCC) Splenomegaly --WBC 258.  Has been this high level for the past year. --oncology consulted  Centrilobular emphysema (HCC) Not acutely exacerbated Continue home inhalers DuoNebs as needed  Essential hypertension --currently hypotensive  --hold antihypertensives in the  setting of acute GI bleed   DVT prophylaxis: SCD/Compression stockings Code Status: Full code  Family Communication: daughter updated at bedside today Level of care: Progressive Dispo:   The patient is from: home Anticipated d/c is to: home Anticipated d/c date is: 1-2 days   Subjective and Interval History:  Pt's BP dropped to 70's when up, and symptomatic of lightheadedness.  Lightheadedness improved with IVF.  No more bloody BM.   Objective: Vitals:   11/21/22 1034 11/21/22 1039 11/21/22 1503 11/21/22 1518  BP: (!) 77/55 (!) 93/50 (!) 124/98 (!) 118/57  Pulse:  72 75 75  Resp: 17 16 19 18   Temp:  97.8 F (36.6 C) 98.5 F (36.9 C) 98.2 F (36.8 C)  TempSrc:  Oral Oral   SpO2:   90%   Weight:      Height:        Intake/Output Summary (Last 24 hours) at 11/21/2022 1548 Last data filed at 11/21/2022 1536 Gross per 24 hour  Intake 960 ml  Output 2 ml  Net 958 ml   Filed Weights   11/20/22 0119  Weight: 63.5 kg    Examination:   Constitutional: NAD, AAOx3 HEENT: conjunctivae and lids normal, EOMI CV: No cyanosis.   RESP: normal respiratory effort, on RA Neuro: II - XII grossly intact.   Psych: Normal mood and affect.  Appropriate judgement and reason   Data Reviewed: I have personally reviewed labs and imaging studies  Time spent: 35 minutes  Katherine Priestly, MD [facility] If 7PM-7AM, please contact night-coverage 11/21/2022, 3:48 PM

## 2022-11-22 DIAGNOSIS — K921 Melena: Secondary | ICD-10-CM | POA: Diagnosis not present

## 2022-11-22 LAB — BPAM RBC
Blood Product Expiration Date: 202410092359
ISSUE DATE / TIME: 202409291452
Unit Type and Rh: 9500

## 2022-11-22 LAB — BASIC METABOLIC PANEL
Anion gap: 5 (ref 5–15)
Anion gap: 7 (ref 5–15)
BUN: 9 mg/dL (ref 8–23)
BUN: 9 mg/dL (ref 8–23)
CO2: 24 mmol/L (ref 22–32)
CO2: 23 mmol/L (ref 22–32)
Calcium: 8.4 mg/dL — ABNORMAL LOW (ref 8.9–10.3)
Calcium: 8.5 mg/dL — ABNORMAL LOW (ref 8.9–10.3)
Chloride: 110 mmol/L (ref 98–111)
Chloride: 111 mmol/L (ref 98–111)
Creatinine, Ser: 0.62 mg/dL (ref 0.44–1.00)
Creatinine, Ser: 0.58 mg/dL (ref 0.44–1.00)
GFR, Estimated: >60 mL/min (ref >60)
GFR, Estimated: >60 mL/min (ref >60)
Glucose, Bld: 110 mg/dL — ABNORMAL HIGH (ref 70–99)
Glucose, Bld: 105 mg/dL — ABNORMAL HIGH (ref 70–99)
Potassium: 6.4 mmol/L (ref 3.5–5.1)
Potassium: 3.2 mmol/L — ABNORMAL LOW (ref 3.5–5.1)
Sodium: 139 mmol/L (ref 135–145)
Sodium: 141 mmol/L (ref 135–145)

## 2022-11-22 LAB — TYPE AND SCREEN
ABO/RH(D): O NEG
Antibody Screen: NEGATIVE
Unit division: 0

## 2022-11-22 LAB — MAGNESIUM: Magnesium: 2 mg/dL (ref 1.7–2.4)

## 2022-11-22 MED ORDER — ZANUBRUTINIB 80 MG PO CAPS: 160.0000 mg | ORAL_CAPSULE | Freq: Every day | ORAL | Status: DC

## 2022-11-22 MED ORDER — PAROXETINE HCL 30 MG PO TABS
30.0000 mg | ORAL_TABLET | Freq: Every day | ORAL | Status: DC
  Administered 2022-11-22: 30 mg via ORAL
  Filled 2022-11-22: qty 1

## 2022-11-22 MED ORDER — POTASSIUM CHLORIDE CRYS ER 20 MEQ PO TBCR
40.0000 meq | EXTENDED_RELEASE_TABLET | Freq: Once | ORAL | Status: AC
  Administered 2022-11-22: 40 meq via ORAL
  Filled 2022-11-22: qty 2

## 2022-11-22 MED ORDER — HYDROCORTISONE ACETATE 25 MG RE SUPP: RECTAL | Status: AC

## 2022-11-22 NOTE — Progress Notes (Signed)
CROSS COVER NOTE  NAME: Katherine [name] MRN: [id number] DOB : 1940/09/07    Concern as stated by nurse / staff   Potassium 6.6, WBC 4 36,000     Pertinent findings on chart review: Last progress note reviewed Pseudohyperkalemia --K+ 6.1 on presentation.  Increased to 6.6 after D5 insulin temporizing.  Due to WBC high of 258, this may be pseudohyperkalemia caused by lysed WBC releasing potassium. --potassium lab ordered with special instruction to lab to "not to centrifuge right away, let blood clot and then centrifuge to get serum".  Repeat potassium down to 3.3. --d/c Lokelma --will not supplement for now    Assessment and  Interventions   Assessment:  Possible false hyperkalemia due to above  Plan: Will repeat BMP with specific instructionsto "not to centrifuge right away, let blood clot and then centrifuge to get serum".  X X

## 2022-11-22 NOTE — Consult Note (Signed)
[facility] Customer service manager [facility]) Accountable Care Organization (ACO) [facility] Liaison Note  11/22/2022  Katherine [name] 04/03/1940 [id number]  Location: [facility] RN Hospital Liaison met patient at bedside at [facility].  Insurance: [employer]   [name] is a 82 y.o. female who is a Primary Care Patient of Cannady, Dorie Rank, NP ([facility]). The patient was screened for readmission hospitalization with noted low risk score for unplanned readmission risk with 1 IP in 6 months.  The patient was assessed for potential [facility]) Care Management service needs for post hospital transition for care coordination. Review of patient's electronic medical record reveals patient was admitted for Acute lower GI Bleed (Hematochezia). Liaison met with pt at bedside an educated on care management services. Pt verified she is being followed by the oncology team closely with no additional needs via care management services. States she has a ery good support systems who assist with all her needs.   [facility] Care Management/Population Health does not replace or interfere with any arrangements made by the Inpatient Transition of Care team.   For questions contact:   Elliot Cousin, RN, [facility] Liaison [place]   Population Health Office Hours MTWF  8:00 am-6:00 pm [phone] mobile [phone] [Office toll free line] Office Hours are M-F 8:30 - 5 pm [email or web address]

## 2022-11-22 NOTE — Discharge Summary (Signed)
Physician Discharge Summary   Elliemae Kollie  female DOB: 12-10-1940  ZOX:[id number]  PCP: Marjie Skiff, NP  Admit date: 11/20/2022 Discharge date: 11/22/2022  Admitted From: home Disposition:  home CODE STATUS: Full code  Discharge Instructions     No wound care   Complete by: As directed       Hospital Course:  For full details, please see H&P, progress notes, consult notes and ancillary notes.  Briefly,  Gloris Barndt is a 82 y.o. female with medical history significant for COPD, CLL, previously on IVIG which she self discontinued due to intolerance from diarrhea, last seen by oncology on 10/29/2022 who presented to the ED with rectal bleeding.    * Hematochezia 2/2 diverticular bleeds --CTA bleeding scan on presentation did not reveal bleeding source. --GI consulted, pt wishes to discuss with Dr Donneta Romberg her oncologist before making the decision for luminal study. --no more rectal bleeding for 2 days prior do discharge.   Acute blood loss anemia --Hgb 8 on presentation.  Was 10.1 3 weeks ago. --1u pRBC (irradiated blood) for Hgb 7.1 and orthostasis. --Hgb 8.6 prior to discharge.   Pseudohyperkalemia --K+ 6.1 on presentation.  Increased to 6.6 after D5 insulin temporizing.  Due to WBC high of 258, this was likely pseudohyperkalemia caused by lysed WBC releasing potassium. --potassium lab ordered with special instruction to lab to "not to centrifuge right away, let blood clot and then centrifuge to get serum".  Repeat potassium down to 3.3.  Hypokalemia --due to potassium removal agents given initially for pseudohyperkalemia.  Oral potassium supplementation given.   Orthostatic hypotension --BP dropped to 77/55 after ambulating to bathroom, with lightheadedness.  After lying down, BP improved to 90's. --NS 500 ml bolus and 1u pRBC were given.  Orthostasis resolved prior to discharge.   Thrombocytopenia, chronic --likely due CLL   CLL (chronic  lymphocytic leukemia) (HCC) Splenomegaly --WBC up to 400's.  Pt's Onc Dr. Donneta Romberg notified, who rec no intervention, and will f/u with pt in outpatient clinic.   Centrilobular emphysema (HCC) Not acutely exacerbated Continue home inhalers   Essential hypertension --hypotensive in the setting of GI bleed, home antihypertensives held during hospitalization.  BP became elevated prior to discharge.  Home benazepril to be resumed after discharge.   Discharge Diagnoses:  Principal Problem:   Hematochezia Active Problems:   ABLA (acute blood loss anemia)   Hyperkalemia   CLL (chronic lymphocytic leukemia) (HCC)   Splenomegaly   Essential hypertension   Anxiety   Centrilobular emphysema (HCC)   Acute lower GI bleeding   30 Day Unplanned Readmission Risk Score    Flowsheet Row ED to Hosp-Admission (Current) from 11/20/2022 in [facility]  30 Day Unplanned Readmission Risk Score (%) 20.13 Filed at 11/22/2022 0801       This score is the patient's risk of an unplanned readmission within 30 days of being discharged (0 -100%). The score is based on dignosis, age, lab data, medications, orders, and past utilization.   Low:  0-14.9   Medium: 15-21.9   High: 22-29.9   Extreme: 30 and above         Discharge Instructions:  Allergies as of 11/22/2022       Reactions   Augmentin [amoxicillin-pot Clavulanate] Swelling   tongue   Biaxin [clarithromycin] Swelling, Rash   tongue        Medication List     TAKE these medications    acetaminophen 325 MG tablet Commonly  known as: TYLENOL Take by mouth.   albuterol 108 (90 Base) MCG/ACT inhaler Commonly known as: VENTOLIN HFA TAKE 2 PUFFS BY MOUTH EVERY 6 HOURS AS NEEDED FOR WHEEZE OR SHORTNESS OF BREATH   atorvastatin 10 MG tablet Commonly known as: LIPITOR TAKE 1 TABLET BY MOUTH EVERY DAY   benazepril 40 MG tablet Commonly known as: LOTENSIN Take 1 tablet (40 mg total) by mouth daily.   Breztri  Aerosphere 160-9-4.8 MCG/ACT Aero Generic drug: Budeson-Glycopyrrol-Formoterol INHALE 2 PUFFS INTO THE LUNGS IN THE MORNING AND AT BEDTIME.   Brukinsa 80 MG capsule Generic drug: zanubrutinib Take 2 capsules (160 mg total) by mouth daily.   diphenoxylate-atropine 2.5-0.025 MG tablet Commonly known as: LOMOTIL Take 1 tablet by mouth 4 (four) times daily as needed for diarrhea or loose stools. Take it along with immodium   hydrocortisone 25 MG suppository Commonly known as: ANUSOL-HC Short term use as needed.  Home med. What changed:  how much to take how to take this when to take this additional instructions   LORazepam 1 MG tablet Commonly known as: ATIVAN Take 0.5 tablets (0.5 mg total) by mouth 2 (two) times daily as needed for anxiety.   mometasone 50 MCG/ACT nasal spray Commonly known as: NASONEX USE 1 SPRAY TO EACH NOSTRIL TWICE A DAY   multivitamin with minerals tablet Take 1 tablet by mouth daily.   PARoxetine 30 MG tablet Commonly known as: PAXIL Take 1 tablet (30 mg total) by mouth daily.   vitamin C 250 MG tablet Commonly known as: ASCORBIC ACID Take 250 mg by mouth daily.   vitamin E 1000 UNIT capsule Take by mouth.         Follow-up Information     Earna Coder, MD Follow up in 1 week(s).   Specialties: Internal Medicine, Oncology Contact information: [address] [place] [address] [phone]                 Allergies  Allergen Reactions   Augmentin [Amoxicillin-Pot Clavulanate] Swelling    tongue   Biaxin [Clarithromycin] Swelling and Rash    tongue     The results of significant diagnostics from this hospitalization (including imaging, microbiology, ancillary and laboratory) are listed below for reference.   Consultations:   Procedures/Studies: CT ANGIO GI BLEED  Result Date: 11/20/2022 CLINICAL DATA:  Acute GI bleeding.  Hematochezia. EXAM: CTA ABDOMEN AND PELVIS WITHOUT AND WITH CONTRAST TECHNIQUE:  Multidetector CT imaging of the abdomen and pelvis was performed using the standard protocol during bolus administration of intravenous contrast. Multiplanar reconstructed images and MIPs were obtained and reviewed to evaluate the vascular anatomy. RADIATION DOSE REDUCTION: This exam was performed according to the departmental dose-optimization program which includes automated exposure control, adjustment of the mA and/or kV according to patient size and/or use of iterative reconstruction technique. CONTRAST:  OMNIPAQUE IOHEXOL 350 MG/ML SOLN COMPARISON:  CT abdomen and pelvis 06/01/2019 FINDINGS: Unenhanced images demonstrate diffuse calcification of the aorta and iliac arteries. Cholelithiasis without evidence of acute cholecystitis. Calcification in the spleen may be dystrophic or representative of old insult. VASCULAR Aorta: Normal caliber aorta without aneurysm, dissection, vasculitis or significant stenosis. Celiac: Patent without evidence of aneurysm, dissection, vasculitis or significant stenosis. SMA: Patent without evidence of aneurysm, dissection, vasculitis or significant stenosis. Renals: Both renal arteries are patent without evidence of aneurysm, dissection, vasculitis, fibromuscular dysplasia or significant stenosis. IMA: Patent without evidence of aneurysm, dissection, vasculitis or significant stenosis. Inflow: Patent without evidence of aneurysm,  dissection, vasculitis or significant stenosis. Proximal Outflow: Bilateral common femoral and visualized portions of the superficial and profunda femoral arteries are patent without evidence of aneurysm, dissection, vasculitis or significant stenosis. Veins: No obvious venous abnormality within the limitations of this arterial phase study. Review of the MIP images confirms the above findings. NON-VASCULAR Lower chest: Emphysematous changes and scarring in the lung bases. Hepatobiliary: No focal liver lesions. Cholelithiasis without evidence of  acute cholecystitis. No bile duct dilatation. Pancreas: Unremarkable. No pancreatic ductal dilatation or surrounding inflammatory changes. Spleen: Prominent splenic enlargement. Several subcentimeter focal lesions likely representing cysts or hemangiomas. Adrenals/Urinary Tract: Adrenal glands are unremarkable. Kidneys are normal, without renal calculi, focal lesion, or hydronephrosis. Bladder is unremarkable. Stomach/Bowel: Stomach, small bowel, and colon are not abnormally distended. No wall thickening or inflammatory changes. No focal lesions. No abnormal intraluminal contrast extravasation is demonstrated. A focal site of active gastrointestinal hemorrhage is not demonstrated. Lymphatic: No significant lymphadenopathy. Reproductive: Status post hysterectomy. No adnexal masses. Other: No abdominal wall hernia or abnormality. No abdominopelvic ascites. Musculoskeletal: Degenerative changes in the spine. Lumbar scoliosis convex towards the right. No acute bony abnormalities. IMPRESSION: VASCULAR Prominent aortoiliac atherosclerosis although vessels remain patent. No evidence of aneurysm or dissection of the major abdominal vasculature. NON-VASCULAR 1. No focal site of active gastrointestinal hemorrhage identified. 2. Cholelithiasis without evidence of acute cholecystitis. 3. Splenic enlargement. 4. Emphysematous changes and scarring in the lung bases. Electronically Signed   By: Burman Nieves M.D.   On: 11/20/2022 03:46      Labs: BNP (last 3 results) No results for input(s): "BNP" in the last 8760 hours. Basic Metabolic Panel: Recent Labs  Lab 11/20/22 0117 11/20/22 0217 11/20/22 0656 11/20/22 1650 11/21/22 1044 11/22/22 0357 11/22/22 0634  NA 140  --   --   --   --  139 141  K 6.1*   < > 6.6* 3.3* 3.3* 6.4* 3.2*  CL 106  --   --   --   --  110 111  CO2 24  --   --   --   --  24 23  GLUCOSE 113*  --   --   --   --  110* 105*  BUN 22  --   --   --   --  9 9  CREATININE 0.86  --   --   --    --  0.62 0.58  CALCIUM 9.2  --   --   --   --  8.4* 8.5*  MG  --   --   --   --   --  2.0  --    < > = values in this interval not displayed.   Liver Function Tests: Recent Labs  Lab 11/20/22 0117  AST 21  ALT 12  ALKPHOS 133*  BILITOT 1.0  PROT 6.2*  ALBUMIN 4.2   No results for input(s): "LIPASE", "AMYLASE" in the last 168 hours. No results for input(s): "AMMONIA" in the last 168 hours. CBC: Recent Labs  Lab 11/20/22 0117 11/20/22 0656 11/20/22 1320 11/21/22 0509 11/22/22 0357  WBC 258.8*  --   --   --  436.2*  NEUTROABS 4.7  --   --   --   --   HGB 8.0* 7.5* 7.4* 7.1* 8.6*  HCT 39.0  --   --   --  34.8*  MCV 117.8*  --   --   --  109.1*  PLT 75*  --   --   --  64*   Cardiac Enzymes: No results for input(s): "CKTOTAL", "CKMB", "CKMBINDEX", "TROPONINI" in the last 168 hours. BNP: Invalid input(s): "POCBNP" CBG: Recent Labs  Lab 11/20/22 0329 11/20/22 0434 11/21/22 1043  GLUCAP 98 109* 117*   D-Dimer No results for input(s): "DDIMER" in the last 72 hours. Hgb A1c No results for input(s): "HGBA1C" in the last 72 hours. Lipid Profile No results for input(s): "CHOL", "HDL", "LDLCALC", "TRIG", "CHOLHDL", "LDLDIRECT" in the last 72 hours. Thyroid function studies No results for input(s): "TSH", "T4TOTAL", "T3FREE", "THYROIDAB" in the last 72 hours.  Invalid input(s): "FREET3" Anemia work up No results for input(s): "VITAMINB12", "FOLATE", "FERRITIN", "TIBC", "IRON", "RETICCTPCT" in the last 72 hours. Urinalysis    Component Value Date/Time   COLORURINE AMBER (A) 04/20/2020 2207   APPEARANCEUR HAZY (A) 04/20/2020 2207   APPEARANCEUR Cloudy (A) 06/07/2019 1314   LABSPEC 1.020 04/20/2020 2207   PHURINE 5.0 04/20/2020 2207   GLUCOSEU NEGATIVE 04/20/2020 2207   HGBUR NEGATIVE 04/20/2020 2207   BILIRUBINUR NEGATIVE 04/20/2020 2207   BILIRUBINUR Negative 06/07/2019 1314   KETONESUR 5 (A) 04/20/2020 2207   PROTEINUR 30 (A) 04/20/2020 2207   NITRITE NEGATIVE  04/20/2020 2207   LEUKOCYTESUR SMALL (A) 04/20/2020 2207   Sepsis Labs Recent Labs  Lab 11/20/22 0117 11/22/22 0357  WBC 258.8* 436.2*   Microbiology No results found for this or any previous visit (from the past 240 hour(s)).   Total time spend on discharging this patient, including the last patient exam, discussing the hospital stay, instructions for ongoing care as it relates to all pertinent caregivers, as well as preparing the medical discharge records, prescriptions, and/or referrals as applicable, is 40 minutes.    Darlin Priestly, MD  [facility] 11/22/2022, 9:41 AM

## 2022-11-23 ENCOUNTER — Other Ambulatory Visit: Payer: Self-pay

## 2022-11-23 LAB — CBC
HCT: 34.8 % — ABNORMAL LOW (ref 36.0–46.0)
Hemoglobin: 8.6 g/dL — ABNORMAL LOW (ref 12.0–15.0)
MCH: 27 pg (ref 26.0–34.0)
MCHC: 24.7 g/dL — ABNORMAL LOW (ref 30.0–36.0)
MCV: 109.1 fL — ABNORMAL HIGH (ref 80.0–100.0)
Platelets: 64 10*3/uL — ABNORMAL LOW (ref 150–400)
RBC: 3.19 MIL/uL — ABNORMAL LOW (ref 3.87–5.11)
WBC: 436.2 10*3/uL (ref 4.0–10.5)
nRBC: 0 % (ref 0.0–0.2)

## 2022-12-01 ENCOUNTER — Inpatient Hospital Stay: Payer: Medicare HMO | Attending: Internal Medicine

## 2022-12-01 ENCOUNTER — Encounter: Payer: Self-pay | Admitting: Internal Medicine

## 2022-12-01 ENCOUNTER — Other Ambulatory Visit: Payer: Self-pay | Admitting: Pharmacist

## 2022-12-01 ENCOUNTER — Other Ambulatory Visit: Payer: Self-pay

## 2022-12-01 ENCOUNTER — Inpatient Hospital Stay (HOSPITAL_BASED_OUTPATIENT_CLINIC_OR_DEPARTMENT_OTHER): Payer: Medicare HMO | Admitting: Internal Medicine

## 2022-12-01 DIAGNOSIS — J449 Chronic obstructive pulmonary disease, unspecified: Secondary | ICD-10-CM | POA: Insufficient documentation

## 2022-12-01 DIAGNOSIS — R5383 Other fatigue: Secondary | ICD-10-CM | POA: Diagnosis not present

## 2022-12-01 DIAGNOSIS — K922 Gastrointestinal hemorrhage, unspecified: Secondary | ICD-10-CM | POA: Diagnosis not present

## 2022-12-01 DIAGNOSIS — D500 Iron deficiency anemia secondary to blood loss (chronic): Secondary | ICD-10-CM | POA: Diagnosis not present

## 2022-12-01 DIAGNOSIS — Z87891 Personal history of nicotine dependence: Secondary | ICD-10-CM | POA: Insufficient documentation

## 2022-12-01 DIAGNOSIS — J9690 Respiratory failure, unspecified, unspecified whether with hypoxia or hypercapnia: Secondary | ICD-10-CM | POA: Insufficient documentation

## 2022-12-01 DIAGNOSIS — D630 Anemia in neoplastic disease: Secondary | ICD-10-CM | POA: Diagnosis not present

## 2022-12-01 DIAGNOSIS — Z803 Family history of malignant neoplasm of breast: Secondary | ICD-10-CM | POA: Diagnosis not present

## 2022-12-01 DIAGNOSIS — Z8701 Personal history of pneumonia (recurrent): Secondary | ICD-10-CM | POA: Insufficient documentation

## 2022-12-01 DIAGNOSIS — C9110 Chronic lymphocytic leukemia of B-cell type not having achieved remission: Secondary | ICD-10-CM

## 2022-12-01 LAB — CMP (CANCER CENTER ONLY)
ALT: 13 U/L (ref 0–44)
AST: 20 U/L (ref 15–41)
Albumin: 4.3 g/dL (ref 3.5–5.0)
Alkaline Phosphatase: 110 U/L (ref 38–126)
Anion gap: 6 (ref 5–15)
BUN: 14 mg/dL (ref 8–23)
CO2: 27 mmol/L (ref 22–32)
Calcium: 9.3 mg/dL (ref 8.9–10.3)
Chloride: 106 mmol/L (ref 98–111)
Creatinine: 0.9 mg/dL (ref 0.44–1.00)
GFR, Estimated: >60 mL/min (ref >60)
Glucose, Bld: 111 mg/dL — ABNORMAL HIGH (ref 70–99)
Potassium: 4.2 mmol/L (ref 3.5–5.1)
Sodium: 139 mmol/L (ref 135–145)
Total Bilirubin: 0.9 mg/dL (ref 0.3–1.2)
Total Protein: 6.2 g/dL — ABNORMAL LOW (ref 6.5–8.1)

## 2022-12-01 LAB — CBC WITH DIFFERENTIAL (CANCER CENTER ONLY)
Abs Immature Granulocytes: 0.27 10*3/uL — ABNORMAL HIGH (ref 0.00–0.07)
Basophils Absolute: 0.6 10*3/uL — ABNORMAL HIGH (ref 0.0–0.1)
Basophils Relative: 0 %
Eosinophils Absolute: 0 10*3/uL (ref 0.0–0.5)
Eosinophils Relative: 0 %
HCT: 30.4 % — ABNORMAL LOW (ref 36.0–46.0)
Hemoglobin: 9 g/dL — ABNORMAL LOW (ref 12.0–15.0)
Immature Granulocytes: 0 %
Lymphocytes Relative: 98 %
Lymphs Abs: 498.6 10*3/uL — ABNORMAL HIGH (ref 0.7–4.0)
MCH: 25.4 pg — ABNORMAL LOW (ref 26.0–34.0)
MCHC: 22 g/dL — ABNORMAL LOW (ref 30.0–36.0)
MCV: 115.5 fL — ABNORMAL HIGH (ref 80.0–100.0)
Monocytes Absolute: 3.3 10*3/uL — ABNORMAL HIGH (ref 0.1–1.0)
Monocytes Relative: 1 %
Neutro Abs: 2.7 10*3/uL (ref 1.7–7.7)
Neutrophils Relative %: 1 %
Platelet Count: 107 10*3/uL — ABNORMAL LOW (ref 150–400)
RBC: 3.03 MIL/uL — ABNORMAL LOW (ref 3.87–5.11)
Smear Review: NORMAL
WBC Count: 505.5 10*3/uL (ref 4.0–10.5)
nRBC: 0 % (ref 0.0–0.2)

## 2022-12-01 LAB — LACTATE DEHYDROGENASE: LDH: 102 U/L (ref 98–192)

## 2022-12-01 MED ORDER — BRUKINSA 80 MG PO CAPS
80.0000 mg | ORAL_CAPSULE | Freq: Every day | ORAL | 1 refills | Status: DC
Start: 2022-12-01 — End: 2023-02-03
  Filled 2022-12-01 – 2022-12-02 (×2): qty 30, 30d supply, fill #0
  Filled 2022-12-23: qty 30, 30d supply, fill #1

## 2022-12-01 NOTE — Progress Notes (Signed)
Critical WBC 505.5 Read back by Almeta Monas in lab  and MD notified.

## 2022-12-01 NOTE — Assessment & Plan Note (Addendum)
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 Deletion-poor tolerance to Ibrutinib-hematuria; ; and Acalburtinib [stopped in March 2022 because of diarrhea]. # AUG 21st, 2024- CT chest [Dr.Gonzalez]- Partially visualized splenomegaly, increased-again suggestive of progressive CLL.  Spleen up to umbilicus. Currently on Zanubrutinib 80 mg In AM [for last 3 weeks- SEP mid 2024].   # Patient tolerating Zanu 80 mg a day fairly well-monitor closely in the context of recent GI bleed see below.  White count 505 likely secondary spurious leukocytosis from zanu.  Thrombocytopenia improved.  See below regarding anemia  # Anemia multifactorial CLL/GI bleed.  No hemolysis.  Recommend gentle iron [iron biglycinate; 28 mg ] 1 pill a day.  This pill is unlikely to cause stomach upset or cause constipation.   # Diverticular bleed [OCT 2024]; s/p hemi-colectomy [Dr.Byrnett] sec to polyps. Colo >>> years ago.  Monitor closely.  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   DEC 2023- immunoglobulins- IgG- 283. Patient declines given concerns of diarrhea./Poor tolerance.  #COPD/history of pneumonia -bilateral Jan 2023 [QUITsmoking-April, 2023; [facility]]-right middle lobe lung atelectasis [Dr.Gonzalez] - AUG 2024- CT- Improved aeration of the right middle lobe with residual mild plate like scarring versus atelectasis in the right middle lobe. Stable.   # Ongoing fatigue multifactorial-underlying chronic respiratory failure; underlying CLL; possible depression/anxiety.   # Vaccination:s/p flu shot; ok with  covid for now.   # left Nodular growth [x1 month]- s/p  excision for  SCC-  dicussed with Dr.Graham-   # DISPOSITION:  # Follow up in 2 months- Labs- cbc/cmp; LDH; PT/PTT; iron studies; ferritin-- Dr.B

## 2022-12-01 NOTE — Progress Notes (Signed)
[facility] CONSULT NOTE  Patient Care Team: Marjie Skiff, NP as PCP - General (Nurse Practitioner) Debbrah Alar, MD (Dermatology) Scot Jun, MD (Inactive) (Gastroenterology) Lemar Livings Merrily Pew, MD (General Surgery) Earna Coder, MD as Consulting Physician (Oncology) Salena Saner, MD as Consulting Physician (Pulmonary Disease)  CHIEF COMPLAINTS/PURPOSE OF CONSULTATION:  CLL  #  Oncology History Overview Note  Deborh Pense is a 82 y.o. female with chronic lymphocytic leukemia (CLL).  WBC has ranged between 22,000 - 101,7000 since 05/2011.   She has received Rituxan x 2 four week cycles (06/27/2015 and 05/27/2017).  Hepatitis B surface antigen and hepatitis B surface antibody were negative on 07/04/2015.   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  CCND1, ATM, chromosome 12, and TP53 were normal.     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  There was a CD5 and CD23 positive monoclonal B cell population with lambda light chain restriction, negative for FMC7 and CD38, representing  88% of leukocytes, >5,000/uL. There was no loss of, or aberrant expression of, the pan T cell  antigens to  suggest a neoplastic T cell process. CD4:CD8 ratio 2.0  No circulating blasts were detected. There was no immunophenotypic evidence of abnormal myeloid maturation.  IGH/BCL2 by FISH is pending. --------------------------------------------------------  She began ibrutinib on 04/24/2019 (discontinued on 05/30/2019).                     She developed blood-streaked sputum (slight) then significant hematuria.                     She declined further ibrutinib. ---------------------------------------------------------------------------------            She began acalabrutinib on 03/14/2020 (held on 05/13/2020 secondary  to diarrhea). --------------------------------------------------------------------------------------   She has a 35 pack year smoking history.  She is in the low dose chest CT program.  Low dose chest CT on 02/02/2018 revealed a new endobronchial lesion in the segmental bronchus to the medial segment of the right middle lobe. This may simply reflect an area of retained secretions, however, the possibility of an endobronchial neoplasm should be considered. Lung-RADS 4AS, suspicious.  Low dose chest CT on 07/05/2018 revealed Lung-RADS 2, benign appearance or behavior.  Prior right middle lobe endobronchial lesion/mucous plugging was no longer visualized.  New mucous plugging in the right lower lobe.  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  Accompanied by her daughter.  [name] 82 y.o.  female CLL-13 q. Deletion currently on Brukinsa [started in sep 2024 ]; COPD- SCC of skin-is here for follow-up.  In the interim patient was admitted to the hospital for diverticulitis bleed.  Status post blood transfusion in the hospital.  Continues to have more fatigue.  Otherwise denies any blood in stools or black-colored stools.  S/p infection of the left arm squamous cell carcinoma of the skin.  Otherwise denies any diarrhea.  Denies any rash.  Review of Systems  Constitutional:  Positive for malaise/fatigue. Negative for chills, diaphoresis, fever and weight loss.  HENT:  Negative for nosebleeds and sore throat.  Eyes:  Negative for double vision.  Respiratory:  Positive for hemoptysis and shortness of breath. Negative for cough, sputum production and wheezing.   Cardiovascular:  Negative for chest  pain, palpitations, orthopnea and leg swelling.  Gastrointestinal:  Negative for abdominal pain, blood in stool, constipation, diarrhea, heartburn, melena, nausea and vomiting.  Genitourinary:  Negative for dysuria, frequency and urgency.  Musculoskeletal:  Positive for back pain. Negative for joint pain.  Skin: Negative.  Negative for itching and rash.  Neurological:  Negative for dizziness, tingling, focal weakness, weakness and headaches.  Endo/Heme/Allergies:  Does not bruise/bleed easily.  Psychiatric/Behavioral:  Negative for depression. The patient is not nervous/anxious and does not have insomnia.      MEDICAL HISTORY:  Past Medical History:  Diagnosis Date   Allergy    Anxiety    Depression    GERD (gastroesophageal reflux disease)    Hemorrhoids    Hypertension    Leukemia, lymphoid (HCC)    CLL   Lobar pneumonia (HCC)    Osteopenia    Personal history of chemotherapy     SURGICAL HISTORY: Past Surgical History:  Procedure Laterality Date   ABDOMINAL HYSTERECTOMY     APPENDECTOMY     BREAST BIOPSY Left    bx x 3-neg   COLON SURGERY     sigmoid resection   COLONOSCOPY     2007, 2012   COLONOSCOPY WITH PROPOFOL N/A 09/17/2015   Procedure: COLONOSCOPY WITH PROPOFOL;  Surgeon: Earline Mayotte, MD;  Location: [facility] ENDOSCOPY;  Service: Endoscopy;  Laterality: N/A;   OOPHORECTOMY     SPINE SURGERY     L4-5    SOCIAL HISTORY: Social History   Socioeconomic History   Marital status: Married    Spouse name: Not on file   Number of children: Not on file   Years of education: 12   Highest education level: 12th grade  Occupational History   Not on file  Tobacco Use   Smoking status: Former    Current packs/day: 0.00    Average packs/day: 1 pack/day for 35.0 years (35.0 ttl pk-yrs)    Types: Cigarettes    Start date: 02/22/1986    Quit date: 02/22/2021    Years since quitting: 1.7   Smokeless tobacco: Never  Vaping Use   Vaping status: Never Used  Substance  and Sexual Activity   Alcohol use: No    Alcohol/week: 0.0 standard drinks of alcohol   Drug use: No   Sexual activity: Not on file  Other Topics Concern   Not on file  Social History Narrative   Not on file   Social Determinants of Health   Financial Resource Strain: Low Risk  (06/10/2022)   Overall Financial Resource Strain (CARDIA)    Difficulty of Paying Living Expenses: Not hard at all  Food Insecurity: No Food Insecurity (11/20/2022)   Hunger Vital Sign    Worried About Running Out of Food in the Last Year: Never true    Ran Out of Food in the Last Year: Never true  Transportation Needs: No Transportation Needs (11/20/2022)   PRAPARE - Administrator, Civil Service (Medical): No    Lack of Transportation (Non-Medical): No  Physical Activity: Insufficiently Active (06/10/2022)   Exercise Vital Sign    Days of Exercise per Week: 2 days    Minutes of Exercise per Session: 20 min  Stress: No Stress Concern Present (06/10/2022)   [employer] of Occupational Health - Occupational Stress Questionnaire  Feeling of Stress : Only a little  Social Connections: Moderately Integrated (06/10/2022)   Social Connection and Isolation Panel [NHANES]    Frequency of Communication with Friends and Family: More than three times a week    Frequency of Social Gatherings with Friends and Family: More than three times a week    Attends Religious Services: More than 4 times per year    Active Member of [employer] or Organizations: No    Attends Banker Meetings: Never    Marital Status: Married  Catering manager Violence: Not At Risk (11/20/2022)   Humiliation, Afraid, Rape, and Kick questionnaire    Fear of Current or Ex-Partner: No    Emotionally Abused: No    Physically Abused: No    Sexually Abused: No    FAMILY HISTORY: Family History  Problem Relation Age of Onset   Osteoporosis Mother    Hypertension Father    Heart attack Father    Stroke Maternal  Grandfather    Breast cancer Sister 52    ALLERGIES:  is allergic to augmentin [amoxicillin-pot clavulanate] and biaxin [clarithromycin].  MEDICATIONS:  Current Outpatient Medications  Medication Sig Dispense Refill   acetaminophen (TYLENOL) 325 MG tablet Take by mouth.     albuterol (VENTOLIN HFA) 108 (90 Base) MCG/ACT inhaler TAKE 2 PUFFS BY MOUTH EVERY 6 HOURS AS NEEDED FOR WHEEZE OR SHORTNESS OF BREATH 18 each 59   atorvastatin (LIPITOR) 10 MG tablet TAKE 1 TABLET BY MOUTH EVERY DAY 90 tablet 3   benazepril (LOTENSIN) 40 MG tablet Take 1 tablet (40 mg total) by mouth daily. 90 tablet 4   BREZTRI AEROSPHERE 160-9-4.8 MCG/ACT AERO INHALE 2 PUFFS INTO THE LUNGS IN THE MORNING AND AT BEDTIME. 10.7 each 11   diphenoxylate-atropine (LOMOTIL) 2.5-0.025 MG tablet Take 1 tablet by mouth 4 (four) times daily as needed for diarrhea or loose stools. Take it along with immodium 60 tablet 1   hydrocortisone (ANUSOL-HC) 25 MG suppository Short term use as needed.  Home med.     LORazepam (ATIVAN) 1 MG tablet Take 0.5 tablets (0.5 mg total) by mouth 2 (two) times daily as needed for anxiety. 60 tablet 4   mometasone (NASONEX) 50 MCG/ACT nasal spray USE 1 SPRAY TO EACH NOSTRIL TWICE A DAY 51 each 1   Multiple Vitamins-Minerals (MULTIVITAMIN WITH MINERALS) tablet Take 1 tablet by mouth daily.     PARoxetine (PAXIL) 30 MG tablet Take 1 tablet (30 mg total) by mouth daily. 90 tablet 4   vitamin C (ASCORBIC ACID) 250 MG tablet Take 250 mg by mouth daily.     vitamin E 1000 UNIT capsule Take by mouth.     zanubrutinib (BRUKINSA) 80 MG capsule Take 1 capsule (80 mg total) by mouth daily. 30 capsule 1   No current facility-administered medications for this visit.      Marland Kitchen  PHYSICAL EXAMINATION: ECOG PERFORMANCE STATUS: 1 - Symptomatic but completely ambulatory  Vitals:   12/01/22 1017  BP: (!) 121/58  Pulse: (!) 55  Temp: (!) 96.2 F (35.7 C)  SpO2: 100%    Filed Weights   12/01/22 1017   Weight: 139 lb 8 oz (63.3 kg)   Spleen-up to umbilicus.  Physical Exam HENT:     Head: Normocephalic and atraumatic.     Mouth/Throat:     Pharynx: No oropharyngeal exudate.  Eyes:     Pupils: Pupils are equal, round, and reactive to light.  Cardiovascular:     Rate and Rhythm:  Normal rate and regular rhythm.  Pulmonary:     Effort: No respiratory distress.     Breath sounds: No wheezing.     Comments: Decreased air entry bilaterally at the bases. Abdominal:     General: Bowel sounds are normal. There is no distension.     Palpations: Abdomen is soft. There is no mass.     Tenderness: There is no abdominal tenderness. There is no guarding or rebound.  Musculoskeletal:        General: No tenderness. Normal range of motion.     Cervical back: Normal range of motion and neck supple.  Skin:    General: Skin is warm.  Neurological:     Mental Status: She is alert and oriented to person, place, and time.  Psychiatric:        Mood and Affect: Affect normal.      LABORATORY DATA:  I have reviewed the data as listed Lab Results  Component Value Date   WBC 505.5 (HH) 12/01/2022   HGB 9.0 (L) 12/01/2022   HCT 30.4 (L) 12/01/2022   MCV 115.5 (H) 12/01/2022   PLT 107 (L) 12/01/2022   Recent Labs    10/29/22 1614 11/20/22 0117 11/20/22 0217 11/22/22 0357 11/22/22 0634 12/01/22 1008  NA 139 140  --  139 141 139  K 5.0 6.1*   < > 6.4* 3.2* 4.2  CL 106 106  --  110 111 106  CO2 24 24  --  24 23 27   GLUCOSE 118* 113*  --  110* 105* 111*  BUN 19 22  --  9 9 14   CREATININE 1.04* 0.86  --  0.62 0.58 0.90  CALCIUM 9.3 9.2  --  8.4* 8.5* 9.3  GFRNONAA 54* >60  --  >60 >60 >60  PROT 6.7 6.2*  --   --   --  6.2*  ALBUMIN 4.8 4.2  --   --   --  4.3  AST 26 21  --   --   --  20  ALT 12 12  --   --   --  13  ALKPHOS 157* 133*  --   --   --  110  BILITOT 0.7 1.0  --   --   --  0.9   < > = values in this interval not displayed.    RADIOGRAPHIC STUDIES: I have personally  reviewed the radiological images as listed and agreed with the findings in the report. CT ANGIO GI BLEED  Result Date: 11/20/2022 CLINICAL DATA:  Acute GI bleeding.  Hematochezia. EXAM: CTA ABDOMEN AND PELVIS WITHOUT AND WITH CONTRAST TECHNIQUE: Multidetector CT imaging of the abdomen and pelvis was performed using the standard protocol during bolus administration of intravenous contrast. Multiplanar reconstructed images and MIPs were obtained and reviewed to evaluate the vascular anatomy. RADIATION DOSE REDUCTION: This exam was performed according to the departmental dose-optimization program which includes automated exposure control, adjustment of the mA and/or kV according to patient size and/or use of iterative reconstruction technique. CONTRAST:  OMNIPAQUE IOHEXOL 350 MG/ML SOLN COMPARISON:  CT abdomen and pelvis 06/01/2019 FINDINGS: Unenhanced images demonstrate diffuse calcification of the aorta and iliac arteries. Cholelithiasis without evidence of acute cholecystitis. Calcification in the spleen may be dystrophic or representative of old insult. VASCULAR Aorta: Normal caliber aorta without aneurysm, dissection, vasculitis or significant stenosis. Celiac: Patent without evidence of aneurysm, dissection, vasculitis or significant stenosis. SMA: Patent without evidence of aneurysm, dissection, vasculitis or significant stenosis. Renals:  Both renal arteries are patent without evidence of aneurysm, dissection, vasculitis, fibromuscular dysplasia or significant stenosis. IMA: Patent without evidence of aneurysm, dissection, vasculitis or significant stenosis. Inflow: Patent without evidence of aneurysm, dissection, vasculitis or significant stenosis. Proximal Outflow: Bilateral common femoral and visualized portions of the superficial and profunda femoral arteries are patent without evidence of aneurysm, dissection, vasculitis or significant stenosis. Veins: No obvious venous abnormality within the  limitations of this arterial phase study. Review of the MIP images confirms the above findings. NON-VASCULAR Lower chest: Emphysematous changes and scarring in the lung bases. Hepatobiliary: No focal liver lesions. Cholelithiasis without evidence of acute cholecystitis. No bile duct dilatation. Pancreas: Unremarkable. No pancreatic ductal dilatation or surrounding inflammatory changes. Spleen: Prominent splenic enlargement. Several subcentimeter focal lesions likely representing cysts or hemangiomas. Adrenals/Urinary Tract: Adrenal glands are unremarkable. Kidneys are normal, without renal calculi, focal lesion, or hydronephrosis. Bladder is unremarkable. Stomach/Bowel: Stomach, small bowel, and colon are not abnormally distended. No wall thickening or inflammatory changes. No focal lesions. No abnormal intraluminal contrast extravasation is demonstrated. A focal site of active gastrointestinal hemorrhage is not demonstrated. Lymphatic: No significant lymphadenopathy. Reproductive: Status post hysterectomy. No adnexal masses. Other: No abdominal wall hernia or abnormality. No abdominopelvic ascites. Musculoskeletal: Degenerative changes in the spine. Lumbar scoliosis convex towards the right. No acute bony abnormalities. IMPRESSION: VASCULAR Prominent aortoiliac atherosclerosis although vessels remain patent. No evidence of aneurysm or dissection of the major abdominal vasculature. NON-VASCULAR 1. No focal site of active gastrointestinal hemorrhage identified. 2. Cholelithiasis without evidence of acute cholecystitis. 3. Splenic enlargement. 4. Emphysematous changes and scarring in the lung bases. Electronically Signed   By: Burman Nieves M.D.   On: 11/20/2022 03:46    ASSESSMENT & PLAN:   CLL (chronic lymphocytic leukemia) (HCC) #CLL-Rai stage II; FISH 13 q. Deletion-poor tolerance to Ibrutinib-hematuria; ; and Acalburtinib [stopped in March 2022 because of diarrhea]. # AUG 21st, 2024- CT chest  [Dr.Gonzalez]- Partially visualized splenomegaly, increased-again suggestive of progressive CLL.  Spleen up to umbilicus. Currently on Zanubrutinib 80 mg In AM [for last 3 weeks- SEP mid 2024].   # Patient tolerating Zanu 80 mg a day fairly well-monitor closely in the context of recent GI bleed see below.  White count 505 likely secondary spurious leukocytosis from zanu.  Thrombocytopenia improved.  See below regarding anemia  # Anemia multifactorial CLL/GI bleed.  No hemolysis.  Recommend gentle iron [iron biglycinate; 28 mg ] 1 pill a day.  This pill is unlikely to cause stomach upset or cause constipation.   # Diverticular bleed [OCT 2024]; s/p hemi-colectomy [Dr.Byrnett] sec to polyps. Colo >>> years ago.  Monitor closely.  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   DEC 2023- immunoglobulins- IgG- 283. Patient declines given concerns of diarrhea./Poor tolerance.  #COPD/history of pneumonia -bilateral Jan 2023 [QUITsmoking-April, 2023; [facility]]-right middle lobe lung atelectasis [Dr.Gonzalez] - AUG 2024- CT- Improved aeration of the right middle lobe with residual mild plate like scarring versus atelectasis in the right middle lobe. Stable.   # Ongoing fatigue multifactorial-underlying chronic respiratory failure; underlying CLL; possible depression/anxiety.   # Vaccination:s/p flu shot; ok with  covid for now.   # left Nodular growth [x1 month]- s/p  excision for  SCC-  dicussed with Dr.Graham-   # DISPOSITION:  # Follow up in 2 months- Labs- cbc/cmp; LDH; PT/PTT; iron studies; ferritin-- Dr.B     All questions were answered.  The patient knows to call the clinic with any problems, questions or concerns.   Earna Coder, MD 12/01/2022 2:20 PM

## 2022-12-01 NOTE — Telephone Encounter (Signed)
MD requested "Please refill zanu 80 mg a day. Thanks" Rx sent to [facility] (Specialty)

## 2022-12-01 NOTE — Patient Instructions (Signed)
#  Recommend gentle iron [iron biglycinate; 28 mg ] 1 pill  every other day.  This pill is unlikely to cause stomach upset or cause constipation.

## 2022-12-01 NOTE — Progress Notes (Signed)
Was seen at [facility] 11/22/22 for Hematochezia 2/2 diverticular bleeds.  C/o fatigue more so than usual, sob occasionally.  Is it ok for her to get the covid shot?  Had flu vaccine last week.  Trouble sleeping, asking for rx.

## 2022-12-02 ENCOUNTER — Other Ambulatory Visit (HOSPITAL_COMMUNITY): Payer: Self-pay

## 2022-12-02 ENCOUNTER — Other Ambulatory Visit: Payer: Self-pay

## 2022-12-02 ENCOUNTER — Other Ambulatory Visit: Payer: Self-pay | Admitting: Pulmonary Disease

## 2022-12-02 NOTE — Progress Notes (Signed)
Specialty Pharmacy Refill Coordination Note  Katherine [name] is a 82 y.o. female contacted today regarding refills of specialty medication(s) Zanubrutinib   Patient requested Delivery   Delivery date: 12/06/22   Verified address: [address] [place], [place] [address]   Medication will be filled on 12/03/22.   Ardeen Fillers, CPhT Oncology Pharmacy Patient Advocate  [facility]  [phone] (phone) [phone] (fax) 12/02/2022 10:21 AM

## 2022-12-05 NOTE — Patient Instructions (Signed)
Be Involved in Caring For Your Health:  Taking Medications When medications are taken as directed, they can greatly improve your health. But if they are not taken as prescribed, they may not work. In some cases, not taking them correctly can be harmful. To help ensure your treatment remains effective and safe, understand your medications and how to take them. Bring your medications to each visit for review by your provider.  Your lab results, notes, and after visit summary will be available on My Chart. We strongly encourage you to use this feature. If lab results are abnormal the clinic will contact you with the appropriate steps. If the clinic does not contact you assume the results are satisfactory. You can always view your results on My Chart. If you have questions regarding your health or results, please contact the clinic during office hours. You can also ask questions on My Chart.  We at [facility] are grateful that you chose [place] to provide your care. We strive to provide evidence-based and compassionate care and are always looking for feedback. If you get a survey from the clinic please complete this so we can hear your opinions.  DASH Eating Plan DASH stands for Dietary Approaches to Stop Hypertension. The DASH eating plan is a healthy eating plan that has been shown to: Lower high blood pressure (hypertension). Reduce your risk for type 2 diabetes, heart disease, and stroke. Help with weight loss. What are tips for following this plan? Reading food labels Check food labels for the amount of salt (sodium) per serving. Choose foods with less than 5 percent of the Daily Value (DV) of sodium. In general, foods with less than 300 milligrams (mg) of sodium per serving fit into this eating plan. To find whole grains, look for the word "whole" as the first word in the ingredient list. Shopping Buy products labeled as "low-sodium" or "no salt added." Buy fresh foods. Avoid canned  foods and pre-made or frozen meals. Cooking Try not to add salt when you cook. Use salt-free seasonings or herbs instead of table salt or sea salt. Check with your health care provider or pharmacist before using salt substitutes. Do not fry foods. Cook foods in healthy ways, such as baking, boiling, grilling, roasting, or broiling. Cook using oils that are good for your heart. These include olive, canola, avocado, soybean, and sunflower oil. Meal planning  Eat a balanced diet. This should include: 4 or more servings of fruits and 4 or more servings of vegetables each day. Try to fill half of your plate with fruits and vegetables. 6-8 servings of whole grains each day. 6 or less servings of lean meat, poultry, or fish each day. 1 oz is 1 serving. A 3 oz (85 g) serving of meat is about the same size as the palm of your hand. One egg is 1 oz (28 g). 2-3 servings of low-fat dairy each day. One serving is 1 cup (237 mL). 1 serving of nuts, seeds, or beans 5 times each week. 2-3 servings of heart-healthy fats. Healthy fats called omega-3 fatty acids are found in foods such as walnuts, flaxseeds, fortified milks, and eggs. These fats are also found in cold-water fish, such as sardines, salmon, and mackerel. Limit how much you eat of: Canned or prepackaged foods. Food that is high in trans fat, such as fried foods. Food that is high in saturated fat, such as fatty meat. Desserts and other sweets, sugary drinks, and other foods with added sugar. Full-fat  dairy products. Do not salt foods before eating. Do not eat more than 4 egg yolks a week. Try to eat at least 2 vegetarian meals a week. Eat more home-cooked food and less restaurant, buffet, and fast food. Lifestyle When eating at a restaurant, ask if your food can be made with less salt or no salt. If you drink alcohol: Limit how much you have to: 0-1 drink a day if you are female. 0-2 drinks a day if you are female. Know how much alcohol is in  your drink. In the [place] one drink is one 12 oz bottle of beer (355 mL), one 5 oz glass of wine (148 mL), or one 1 oz glass of hard liquor (44 mL). General information Avoid eating more than 2,300 mg of salt a day. If you have hypertension, you may need to reduce your sodium intake to 1,500 mg a day. Work with your provider to stay at a healthy body weight or lose weight. Ask what the best weight range is for you. On most days of the week, get at least 30 minutes of exercise that causes your heart to beat faster. This may include walking, swimming, or biking. Work with your provider or dietitian to adjust your eating plan to meet your specific calorie needs. What foods should I eat? Fruits All fresh, dried, or frozen fruit. Canned fruits that are in their natural juice and do not have sugar added to them. Vegetables Fresh or frozen vegetables that are raw, steamed, roasted, or grilled. Low-sodium or reduced-sodium tomato and vegetable juice. Low-sodium or reduced-sodium tomato sauce and tomato paste. Low-sodium or reduced-sodium canned vegetables. Grains Whole-grain or whole-wheat bread. Whole-grain or whole-wheat pasta. Brown rice. Orpah Cobb. Bulgur. Whole-grain and low-sodium cereals. Pita bread. Low-fat, low-sodium crackers. Whole-wheat flour tortillas. Meats and other proteins Skinless chicken or [place]. Ground chicken or [place]. Pork with fat trimmed off. Fish and seafood. Egg whites. Dried beans, peas, or lentils. Unsalted nuts, nut butters, and seeds. Unsalted canned beans. Lean cuts of beef with fat trimmed off. Low-sodium, lean precooked or cured meat, such as sausages or meat loaves. Dairy Low-fat (1%) or fat-free (skim) milk. Reduced-fat, low-fat, or fat-free cheeses. Nonfat, low-sodium ricotta or cottage cheese. Low-fat or nonfat yogurt. Low-fat, low-sodium cheese. Fats and oils Soft margarine without trans fats. Vegetable oil. Reduced-fat, low-fat, or light mayonnaise and salad  dressings (reduced-sodium). Canola, safflower, olive, avocado, soybean, and sunflower oils. Avocado. Seasonings and condiments Herbs. Spices. Seasoning mixes without salt. Other foods Unsalted popcorn and pretzels. Fat-free sweets. The items listed above may not be all the foods and drinks you can have. Talk to a dietitian to learn more. What foods should I avoid? Fruits Canned fruit in a light or heavy syrup. Fried fruit. Fruit in cream or butter sauce. Vegetables Creamed or fried vegetables. Vegetables in a cheese sauce. Regular canned vegetables that are not marked as low-sodium or reduced-sodium. Regular canned tomato sauce and paste that are not marked as low-sodium or reduced-sodium. Regular tomato and vegetable juices that are not marked as low-sodium or reduced-sodium. Rosita Fire. Olives. Grains Baked goods made with fat, such as croissants, muffins, or some breads. Dry pasta or rice meal packs. Meats and other proteins Fatty cuts of meat. Ribs. Fried meat. Tomasa Blase. Bologna, salami, and other precooked or cured meats, such as sausages or meat loaves, that are not lean and low in sodium. Fat from the back of a pig (fatback). Bratwurst. Salted nuts and seeds. Canned beans with added salt. Canned  or smoked fish. Whole eggs or egg yolks. Chicken or [place] with skin. Dairy Whole or 2% milk, cream, and half-and-half. Whole or full-fat cream cheese. Whole-fat or sweetened yogurt. Full-fat cheese. Nondairy creamers. Whipped toppings. Processed cheese and cheese spreads. Fats and oils Butter. Stick margarine. Lard. Shortening. Ghee. Bacon fat. Tropical oils, such as coconut, palm kernel, or palm oil. Seasonings and condiments Onion salt, garlic salt, seasoned salt, table salt, and sea salt. Worcestershire sauce. Tartar sauce. Barbecue sauce. Teriyaki sauce. Soy sauce, including reduced-sodium soy sauce. Steak sauce. Canned and packaged gravies. Fish sauce. Oyster sauce. Cocktail sauce. Store-bought  horseradish. Ketchup. Mustard. Meat flavorings and tenderizers. Bouillon cubes. Hot sauces. Pre-made or packaged marinades. Pre-made or packaged taco seasonings. Relishes. Regular salad dressings. Other foods Salted popcorn and pretzels. The items listed above may not be all the foods and drinks you should avoid. Talk to a dietitian to learn more. Where to find more information National Heart, Lung, and Blood Institute (NHLBI): BuffaloDryCleaner.gl American Heart Association (AHA): heart.org Academy of Nutrition and Dietetics: eatright.org National Kidney Foundation (NKF): kidney.org This information is not intended to replace advice given to you by your health care provider. Make sure you discuss any questions you have with your health care provider. Document Revised: 02/25/2022 Document Reviewed: 02/25/2022 Elsevier Patient Education  2024 [employer].

## 2022-12-10 ENCOUNTER — Encounter: Payer: Self-pay | Admitting: Nurse Practitioner

## 2022-12-10 ENCOUNTER — Ambulatory Visit (INDEPENDENT_AMBULATORY_CARE_PROVIDER_SITE_OTHER): Payer: Medicare HMO | Admitting: Nurse Practitioner

## 2022-12-10 VITALS — BP 115/71 | HR 76 | Temp 98.5°F | Ht 66.0 in | Wt 139.0 lb

## 2022-12-10 DIAGNOSIS — D696 Thrombocytopenia, unspecified: Secondary | ICD-10-CM | POA: Diagnosis not present

## 2022-12-10 DIAGNOSIS — J432 Centrilobular emphysema: Secondary | ICD-10-CM | POA: Diagnosis not present

## 2022-12-10 DIAGNOSIS — F419 Anxiety disorder, unspecified: Secondary | ICD-10-CM

## 2022-12-10 DIAGNOSIS — I700 Atherosclerosis of aorta: Secondary | ICD-10-CM

## 2022-12-10 DIAGNOSIS — R7301 Impaired fasting glucose: Secondary | ICD-10-CM

## 2022-12-10 DIAGNOSIS — Z23 Encounter for immunization: Secondary | ICD-10-CM

## 2022-12-10 DIAGNOSIS — Z87891 Personal history of nicotine dependence: Secondary | ICD-10-CM | POA: Diagnosis not present

## 2022-12-10 DIAGNOSIS — F17210 Nicotine dependence, cigarettes, uncomplicated: Secondary | ICD-10-CM

## 2022-12-10 DIAGNOSIS — Z79899 Other long term (current) drug therapy: Secondary | ICD-10-CM | POA: Diagnosis not present

## 2022-12-10 DIAGNOSIS — E7800 Pure hypercholesterolemia, unspecified: Secondary | ICD-10-CM

## 2022-12-10 DIAGNOSIS — D692 Other nonthrombocytopenic purpura: Secondary | ICD-10-CM

## 2022-12-10 DIAGNOSIS — F339 Major depressive disorder, recurrent, unspecified: Secondary | ICD-10-CM

## 2022-12-10 DIAGNOSIS — C9110 Chronic lymphocytic leukemia of B-cell type not having achieved remission: Secondary | ICD-10-CM

## 2022-12-10 DIAGNOSIS — I10 Essential (primary) hypertension: Secondary | ICD-10-CM | POA: Diagnosis not present

## 2022-12-10 NOTE — Assessment & Plan Note (Signed)
Chronic.  Noted on exam, recommend gentle skin care and monitoring for wounds, if wounds present immediately alert provider.

## 2022-12-10 NOTE — Progress Notes (Signed)
BP 115/71   Pulse 76   Temp 98.5 F (36.9 C) (Oral)   Ht 5\' 6"  (1.676 m)   Wt 139 lb (63 kg)   SpO2 98%   BMI 22.44 kg/m    Subjective:    Patient ID: Katherine [name], female    DOB: Jul 02, 1940, 82 y.o.   MRN: [id number]  HPI: Katherine [name] is a 82 y.o. female  Chief Complaint  Patient presents with   COPD   Depression   Hyperlipidemia   HYPERLIPIDEMIA/HTN Continues on Atorvastatin 10 MG + Benazepril. Hyperlipidemia status: good compliance Satisfied with current treatment?  yes Side effects:  no Medication compliance: good compliance Supplements: none Aspirin:  no The ASCVD Risk score (Arnett DK, et al., 2019) failed to calculate for the following reasons:   The 2019 ASCVD risk score is only valid for ages 17 to 62 Chest pain:  no Coronary artery disease:  no Family history CAD:  yes Family history early CAD:  no   COPD Had visit with pulmonary last 10/06/22.  Underlying emphysema and aortic atherosclerosis noted on past imaging. Continues on [place] and then Albuterol as needed.  Currently is on program and does not need to pay for [facility].  Stopped vest for awhile due to increasing bathroom breaks, she reduced pressure and continues to have bathroom issues.  Not a current smoker, has not smoked in 2 years come January.  Has underlying CLL and is followed closely by oncology with last visit 12/01/22/24, took infusions with last one in April 2024.   COPD status: stable Satisfied with current treatment?: yes Oxygen use: no Dyspnea frequency: no Cough frequency: no Rescue inhaler frequency: two in morning as directed and two at night Limitation of activity: no Productive cough: none Last Spirometry: with pulmonary Pneumovax: Up to Date Influenza: Up to Date  ANXIETY/STRESS Continues on Paxil 30 MG daily + Ativan 0.5 MG BID PRN -- she stopped taking 2 months ago due to pharmacy kept wanting to talk to her about it. She is doing okay without it.   Pt is  aware of risks of benzo medication use to include increased sedation, respiratory suppression, falls, dependence and cardiovascular events.   Pt would like to continue treatment as benefit determined to outweigh risk.  PDMP review last filled 08/17/22.  UDS last on 06/10/22. Duration:controlled Anxious mood: yes, occasional Excessive worrying: no Irritability: no  Sweating: no Nausea: no Palpitations:no Hyperventilation: no Panic attacks: no Agoraphobia: no  Obscessions/compulsions: no Depressed mood: no    12/10/2022   10:14 AM 06/10/2022   11:51 AM 12/09/2021    1:11 PM 08/17/2021    9:46 AM 05/13/2021    9:47 AM  Depression screen PHQ 2/9  Decreased Interest 0 0 0 0 0  Down, Depressed, Hopeless 0 0 0 0 0  PHQ - 2 Score 0 0 0 0 0  Altered sleeping 0 0 0 0 0  Tired, decreased energy 0 0 0 1 0  Change in appetite 0 0 0 0 0  Feeling bad or failure about yourself  0 0 0 0 0  Trouble concentrating 0 0 0 0 0  Moving slowly or fidgety/restless 0 0 0 0 0  Suicidal thoughts 0 0 0 0 0  PHQ-9 Score 0 0 0 1 0  Difficult doing work/chores Not difficult at all  Not difficult at all Not difficult at all        12/10/2022   10:14 AM 06/10/2022   11:51  AM 12/09/2021    1:10 PM 05/13/2021    9:48 AM  GAD 7 : Generalized Anxiety Score  Nervous, Anxious, on Edge 0 1 1 0  Control/stop worrying 0 1 1 0  Worry too much - different things 0 0 1 0  Trouble relaxing 0 1 1 0  Restless 0 0  0  Easily annoyed or irritable 0 0 1 0  Afraid - awful might happen 0 0  0  Total GAD 7 Score 0 3  0  Anxiety Difficulty Not difficult at all Not difficult at all Not difficult at all Not difficult at all   Relevant past medical, surgical, family and social history reviewed and updated as indicated. Interim medical history since our last visit reviewed. Allergies and medications reviewed and updated.  Review of Systems  Constitutional:  Negative for activity change, appetite change, diaphoresis, fatigue and  fever.  HENT:  Negative for ear discharge and ear pain.   Respiratory:  Negative for cough, chest tightness and shortness of breath.   Cardiovascular:  Negative for chest pain, palpitations and leg swelling.  Gastrointestinal: Negative.   Neurological: Negative.   Psychiatric/Behavioral: Negative.      Per HPI unless specifically indicated above     Objective:    BP 115/71   Pulse 76   Temp 98.5 F (36.9 C) (Oral)   Ht 5\' 6"  (1.676 m)   Wt 139 lb (63 kg)   SpO2 98%   BMI 22.44 kg/m   Wt Readings from Last 3 Encounters:  12/10/22 139 lb (63 kg)  12/01/22 139 lb 8 oz (63.3 kg)  11/20/22 140 lb (63.5 kg)    Physical Exam Vitals and nursing note reviewed.  Constitutional:      General: She is awake. She is not in acute distress.    Appearance: She is well-developed and well-groomed. She is not ill-appearing or toxic-appearing.  HENT:     Head: Normocephalic.     Right Ear: Hearing normal.     Left Ear: Hearing normal.  Eyes:     General: Lids are normal.        Right eye: No discharge.        Left eye: No discharge.     Conjunctiva/sclera: Conjunctivae normal.     Pupils: Pupils are equal, round, and reactive to light.  Neck:     Thyroid: No thyromegaly.     Vascular: No carotid bruit.  Cardiovascular:     Rate and Rhythm: Normal rate and regular rhythm.     Heart sounds: Normal heart sounds. No murmur heard.    No gallop.  Pulmonary:     Effort: Pulmonary effort is normal. No accessory muscle usage or respiratory distress.     Breath sounds: Normal breath sounds.  Abdominal:     General: Bowel sounds are normal.     Palpations: Abdomen is soft.  Musculoskeletal:     Cervical back: Normal range of motion and neck supple.     Right lower leg: 1+ Edema present.     Left lower leg: 1+ Edema present.  Lymphadenopathy:     Cervical: No cervical adenopathy.  Skin:    General: Skin is warm and dry.     Comments: Scattered pale bruising to upper extremities.   Neurological:     Mental Status: She is alert and oriented to person, place, and time.     Deep Tendon Reflexes: Reflexes are normal and symmetric.     Reflex Scores:  Brachioradialis reflexes are 2+ on the right side and 2+ on the left side.      Patellar reflexes are 2+ on the right side and 2+ on the left side. Psychiatric:        Attention and Perception: Attention normal.        Mood and Affect: Mood normal.        Speech: Speech normal.        Behavior: Behavior normal. Behavior is cooperative.        Thought Content: Thought content normal.     Results for orders placed or performed in visit on 12/01/22  Lactate dehydrogenase  Result Value Ref Range   LDH 102 98 - 192 U/L  CMP (Cancer Center only)  Result Value Ref Range   Sodium 139 135 - 145 mmol/L   Potassium 4.2 3.5 - 5.1 mmol/L   Chloride 106 98 - 111 mmol/L   CO2 27 22 - 32 mmol/L   Glucose, Bld 111 (H) 70 - 99 mg/dL   BUN 14 8 - 23 mg/dL   Creatinine 1.61 0.96 - 1.00 mg/dL   Calcium 9.3 8.9 - 04.5 mg/dL   Total Protein 6.2 (L) 6.5 - 8.1 g/dL   Albumin 4.3 3.5 - 5.0 g/dL   AST 20 15 - 41 U/L   ALT 13 0 - 44 U/L   Alkaline Phosphatase 110 38 - 126 U/L   Total Bilirubin 0.9 0.3 - 1.2 mg/dL   GFR, Estimated >40 >98 mL/min   Anion gap 6 5 - 15  CBC with Differential (Cancer Center Only)  Result Value Ref Range   WBC Count 505.5 (HH) 4.0 - 10.5 K/uL   RBC 3.03 (L) 3.87 - 5.11 MIL/uL   Hemoglobin 9.0 (L) 12.0 - 15.0 g/dL   HCT 11.9 (L) 14.7 - 82.9 %   MCV 115.5 (H) 80.0 - 100.0 fL   MCH 25.4 (L) 26.0 - 34.0 pg   MCHC 22.0 (L) 30.0 - 36.0 g/dL   RDW Not Measured 56.2 - 15.5 %   Platelet Count 107 (L) 150 - 400 K/uL   nRBC 0.0 0.0 - 0.2 %   Neutrophils Relative % 1 %   Neutro Abs 2.7 1.7 - 7.7 K/uL   Lymphocytes Relative 98 %   Lymphs Abs 498.6 (H) 0.7 - 4.0 K/uL   Monocytes Relative 1 %   Monocytes Absolute 3.3 (H) 0.1 - 1.0 K/uL   Eosinophils Relative 0 %   Eosinophils Absolute 0.0 0.0 - 0.5 K/uL    Basophils Relative 0 %   Basophils Absolute 0.6 (H) 0.0 - 0.1 K/uL   WBC Morphology      Lymphocytosis with morphology consistent with known diagnosis of CLL.   RBC Morphology MORPHOLOGY UNREMARKABLE    Smear Review Normal platelet morphology    Immature Granulocytes 0 %   Abs Immature Granulocytes 0.27 (H) 0.00 - 0.07 K/uL      Assessment & Plan:   Problem List Items Addressed This Visit       Cardiovascular and Mediastinum   Atherosclerosis of aorta (HCC)    Chronic, ongoing.  Noted on imaging in past, recommend continued cessation of smoking and continue daily statin for prevention.      Essential hypertension    Chronic, stable.  BP at goal today in office.  Continue current medication regimen and adjust as needed.  Recommend she monitor BP at least a few mornings a week at home and document.  DASH diet at  home.  LABS: CMP.  Could consider change to ARB in future due to her underlying COPD, has been on ACE for several years.         Senile purpura (HCC)    Chronic.  Noted on exam, recommend gentle skin care and monitoring for wounds, if wounds present immediately alert provider.        Respiratory   Centrilobular emphysema (HCC)    Chronic, stable.  Continue current medication regimen as ordered by pulmonary + continue use of InCourage vest.   Recommend she avoid use of Benadryl due to age >26 and BEERS criteria, utilize Claritin instead.  Currently stable with pulmonary regimen.          Hematopoietic and Hemostatic   Thrombocytopenia (HCC)    With underlying CLL, followed by oncology.  Continue this collaboration.        Other   Anxiety    Refer to depression plan of care.      CLL (chronic lymphocytic leukemia) (HCC) - Primary    Chronic, under treatment with oncology.  Recent note and labs reviewed.  Continue collaboration with oncology provider.  Discussed goals of care with patient.      Depression, recurrent (HCC)    Chronic, stable with no SI/HI.   Continue Paxil at this time and consider transition in future to Sertraline due to age >23 and anticholinergic effects with Paxil.  She self stopped Ativan and reports doing well without it. Will monitor.  UDS updated 06/10/22 and contract on file.  Return in 6 months, has multiple specialty visits with CLL -- will span follow-up out.      History of prior cigarette smoking    Quit almost 2 years ago.  Recommend continued cessation.      Hypercholesterolemia    Chronic, ongoing.  Continue current medication regimen and adjust as needed.  Lipid panel today.       Relevant Orders   Comprehensive metabolic panel   Lipid Panel w/o Chol/HDL Ratio   Long-term current use of benzodiazepine    Has self stopped these.  3 months back and doing well without them.  Monitor closely.      Other Visit Diagnoses     IFG (impaired fasting glucose)       A1c today in office.   Relevant Orders   HgB A1c        Follow up plan: Return in about 6 months (around 06/10/2023) for COPD, MOOD, HLD.

## 2022-12-10 NOTE — Assessment & Plan Note (Signed)
Chronic, stable with no SI/HI.  Continue Paxil at this time and consider transition in future to Sertraline due to age >42 and anticholinergic effects with Paxil.  She self stopped Ativan and reports doing well without it. Will monitor.  UDS updated 06/10/22 and contract on file.  Return in 6 months, has multiple specialty visits with CLL -- will span follow-up out.

## 2022-12-10 NOTE — Assessment & Plan Note (Signed)
Chronic, under treatment with oncology.  Recent note and labs reviewed.  Continue collaboration with oncology provider.  Discussed goals of care with patient.

## 2022-12-10 NOTE — Assessment & Plan Note (Signed)
Refer to depression plan of care. 

## 2022-12-10 NOTE — Assessment & Plan Note (Signed)
Chronic, ongoing.  Continue current medication regimen and adjust as needed. Lipid panel today. 

## 2022-12-10 NOTE — Assessment & Plan Note (Signed)
Chronic, ongoing.  Noted on imaging in past, recommend continued cessation of smoking and continue daily statin for prevention.

## 2022-12-10 NOTE — Assessment & Plan Note (Signed)
Chronic, stable.  Continue current medication regimen as ordered by pulmonary + continue use of InCourage vest.   Recommend she avoid use of Benadryl due to age >49 and BEERS criteria, utilize Claritin instead.  Currently stable with pulmonary regimen.

## 2022-12-10 NOTE — Assessment & Plan Note (Signed)
Has self stopped these.  3 months back and doing well without them.  Monitor closely.

## 2022-12-10 NOTE — Assessment & Plan Note (Signed)
Chronic, stable.  BP at goal today in office.  Continue current medication regimen and adjust as needed.  Recommend she monitor BP at least a few mornings a week at home and document.  DASH diet at home.  LABS: CMP.  Could consider change to ARB in future due to her underlying COPD, has been on ACE for several years.

## 2022-12-10 NOTE — Assessment & Plan Note (Signed)
Quit almost 2 years ago.  Recommend continued cessation.

## 2022-12-10 NOTE — Assessment & Plan Note (Signed)
With underlying CLL, followed by oncology.  Continue this collaboration. 

## 2022-12-11 LAB — COMPREHENSIVE METABOLIC PANEL
ALT: 11 [IU]/L (ref 0–32)
AST: 18 [IU]/L (ref 0–40)
Albumin: 4.8 g/dL — ABNORMAL HIGH (ref 3.7–4.7)
Alkaline Phosphatase: 131 [IU]/L — ABNORMAL HIGH (ref 44–121)
BUN/Creatinine Ratio: 12 (ref 12–28)
BUN: 13 mg/dL (ref 8–27)
Bilirubin Total: 0.7 mg/dL (ref 0.0–1.2)
CO2: 24 mmol/L (ref 20–29)
Calcium: 9.4 mg/dL (ref 8.7–10.3)
Chloride: 105 mmol/L (ref 96–106)
Creatinine, Ser: 1.07 mg/dL — ABNORMAL HIGH (ref 0.57–1.00)
Globulin, Total: 1.6 g/dL (ref 1.5–4.5)
Glucose: 87 mg/dL (ref 70–99)
Potassium: 4 mmol/L (ref 3.5–5.2)
Sodium: 142 mmol/L (ref 134–144)
Total Protein: 6.4 g/dL (ref 6.0–8.5)
eGFR: 52 mL/min/{1.73_m2} — ABNORMAL LOW (ref >59)

## 2022-12-11 LAB — LIPID PANEL W/O CHOL/HDL RATIO
Cholesterol, Total: 126 mg/dL (ref 100–199)
HDL: 45 mg/dL (ref >39)
LDL Chol Calc (NIH): 60 mg/dL (ref 0–99)
Triglycerides: 114 mg/dL (ref 0–149)
VLDL Cholesterol Cal: 21 mg/dL (ref 5–40)

## 2022-12-11 LAB — HEMOGLOBIN A1C
Est. average glucose Bld gHb Est-mCnc: 120 mg/dL
Hgb A1c MFr Bld: 5.8 % — ABNORMAL HIGH (ref 4.8–5.6)

## 2022-12-11 NOTE — Progress Notes (Signed)
Contacted via MyChart   Good evening Anthea, your labs have returned: - Some mild decline in kidney function present from previous labs.  Please ensure you are drinking plenty of water daily and avoid Ibuprofen products. - A1c remains 5.8%, no change but still just a little in prediabetic range. - Cholesterol levels look great!!  Continue all current medication.  Any questions? Keep being stellar!!  Thank you for allowing me to participate in your care.  I appreciate you. Kindest regards, Xariah Silvernail

## 2022-12-16 DIAGNOSIS — L578 Other skin changes due to chronic exposure to nonionizing radiation: Secondary | ICD-10-CM | POA: Diagnosis not present

## 2022-12-16 DIAGNOSIS — Z859 Personal history of malignant neoplasm, unspecified: Secondary | ICD-10-CM | POA: Diagnosis not present

## 2022-12-22 ENCOUNTER — Other Ambulatory Visit: Payer: Self-pay | Admitting: Nurse Practitioner

## 2022-12-22 NOTE — Telephone Encounter (Signed)
Labs in date  Requested Prescriptions  Pending Prescriptions Disp Refills   atorvastatin (LIPITOR) 10 MG tablet [Pharmacy Med Name: ATORVASTATIN 10 MG TABLET] 90 tablet 3    Sig: TAKE 1 TABLET BY MOUTH EVERY DAY     Cardiovascular:  Antilipid - Statins Failed - 12/22/2022  1:33 AM      Failed - Lipid Panel in normal range within the last 12 months    Cholesterol, Total  Date Value Ref Range Status  12/10/2022 126 100 - 199 mg/dL Final   Cholesterol Piccolo, Waived  Date Value Ref Range Status  03/09/2016 119 <200 mg/dL Final    Comment:                            Desirable                <200                         Borderline High      200- 239                         High                     >239    LDL Chol Calc (NIH)  Date Value Ref Range Status  12/10/2022 60 0 - 99 mg/dL Final   HDL  Date Value Ref Range Status  12/10/2022 45 >39 mg/dL Final   Triglycerides  Date Value Ref Range Status  12/10/2022 114 0 - 149 mg/dL Final   Triglycerides Piccolo,Waived  Date Value Ref Range Status  03/09/2016 273 (H) <150 mg/dL Final    Comment:                            Normal                   <150                         Borderline High     150 - 199                         High                200 - 499                         Very High                >499          Passed - Patient is not pregnant      Passed - Valid encounter within last 12 months    Recent Outpatient Visits           1 week ago CLL (chronic lymphocytic leukemia) (HCC)   [place] [facility] [place], Cottonwood T, NP   6 months ago Medicare annual wellness visit, subsequent   [place] [facility] [place], Witherbee T, NP   1 year ago CLL (chronic lymphocytic leukemia) (HCC)   [place] [facility] [place], Corrie Dandy T, NP   1 year ago CLL (chronic lymphocytic leukemia) (HCC)   [facility] [place], Dorie Rank, NP  1 year ago  Centrilobular emphysema (HCC)   [place] [facility] [place], Dorie Rank, NP       Future Appointments             In 5 months [place], Dorie Rank, NP [place] [facility]

## 2022-12-23 ENCOUNTER — Other Ambulatory Visit: Payer: Self-pay

## 2022-12-23 NOTE — Progress Notes (Signed)
Specialty Pharmacy Refill Coordination Note  Katherine [name] is a 82 y.o. female contacted today regarding refills of specialty medication(s) Zanubrutinib   Patient requested Delivery   Delivery date: 12/29/22   Verified address: [address] [place], [place] [address]   Medication will be filled on 12/28/22.

## 2022-12-29 ENCOUNTER — Encounter: Payer: Self-pay | Admitting: Pulmonary Disease

## 2022-12-29 ENCOUNTER — Ambulatory Visit: Payer: Medicare HMO | Admitting: Pulmonary Disease

## 2022-12-29 VITALS — BP 118/78 | HR 68 | Temp 98.7°F | Ht 66.0 in | Wt 140.4 lb

## 2022-12-29 DIAGNOSIS — D803 Selective deficiency of immunoglobulin G [IgG] subclasses: Secondary | ICD-10-CM

## 2022-12-29 DIAGNOSIS — J449 Chronic obstructive pulmonary disease, unspecified: Secondary | ICD-10-CM

## 2022-12-29 DIAGNOSIS — C9110 Chronic lymphocytic leukemia of B-cell type not having achieved remission: Secondary | ICD-10-CM | POA: Diagnosis not present

## 2022-12-29 DIAGNOSIS — J479 Bronchiectasis, uncomplicated: Secondary | ICD-10-CM

## 2022-12-29 NOTE — Progress Notes (Signed)
Subjective:    Patient ID: Katherine [name], female    DOB: 09-13-1940, 82 y.o.   MRN: [id number]  Patient Care Team: Marjie Skiff, NP as PCP - General (Nurse Practitioner) Debbrah Alar, MD (Dermatology) Scot Jun, MD (Inactive) (Gastroenterology) Lemar Livings, Merrily Pew, MD (General Surgery) Earna Coder, MD as Consulting Physician (Oncology) Salena Saner, MD as Consulting Physician (Pulmonary Disease)  Chief Complaint  Patient presents with   Follow-up    No SOB or wheezing. Occasional cough with clear sputum.     BACKGROUND/INTERVAL:This is an 82 year old former smoker (quit January 2023) with 35-pack-year history of smoking and a history as noted below, who presents for follow-up on abnormal chest CT and bronchiectasis.  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  Normal CT chest due to mucous plugging and atelectasis which has cleared with chest vest therapy.  Treat.  Since her prior visit of 06 October 2022 she had an admission to [facility] from 20 November 2022 through 22 November 2022 due to diverticular bleed.  No COPD exacerbation or bronchiectasis exacerbation at that time.  HPI Discussed the use of AI scribe software for clinical note transcription with the patient, who gave verbal consent to proceed.  History of Present Illness   The patient, with a history of bronchiectasis, COPD, and CLL, was using the InCourage vest once or twice daily for bronchiectasis management. However, she had to pause its use due to a hospitalization for a diverticular bleed. The patient also reported that the vest use was associated with diarrhea, which she was concerned might have contributed to the diverticulitis. During our discussion she was assured this was not the case.  The patient's chest CT in August showed improvement, with previously atelectatic areas of the lung now resolved. She continues to use Breztri for COPD management  and has had her prescription renewed for the next year.  Since her hospitalization in September, the patient has not experienced any fevers or chills. However, she did undergo a excision of a skin lesion on her arm, which was found to be benign. She also reported ecchymosis, which she was informed could be related to her CLL.  The patient is currently on PO iron supplementation and has noticed an increase in her energy levels. She has received her COVID and flu vaccinations and had an RSV vaccination the previous year.     Review of Systems A 10 point review of systems was performed and it is as noted above otherwise negative.   Patient Active Problem List   Diagnosis Date Noted   Controlled substance agreement signed 12/09/2021   Type O blood, Rh negative 05/09/2021   Long-term current use of benzodiazepine 02/05/2020   Splenomegaly 02/02/2020   Thrombocytopenia (HCC) 02/02/2020   Allergic rhinitis 07/18/2018   Goals of care, counseling/discussion 07/05/2018   History of prior cigarette smoking 05/01/2018   Centrilobular emphysema (HCC) 02/05/2018   Advanced care planning/counseling discussion 01/09/2018   Anxiety 01/04/2017   Hypercholesterolemia 01/08/2016   Atherosclerosis of aorta (HCC) 01/07/2016   History of colonic polyps 08/14/2015   CLL (chronic lymphocytic leukemia) (HCC) 05/28/2015   Senile purpura (HCC) 01/06/2015   Essential hypertension 01/06/2015   Depression, recurrent (HCC) 01/06/2015    Social History   Tobacco Use   Smoking status: Former    Current packs/day: 0.00    Average packs/day: 1 pack/day for 35.0 years (35.0 ttl pk-yrs)    Types: Cigarettes  Start date: 02/22/1986    Quit date: 02/22/2021    Years since quitting: 1.8   Smokeless tobacco: Never  Substance Use Topics   Alcohol use: No    Alcohol/week: 0.0 standard drinks of alcohol    Allergies  Allergen Reactions   Augmentin [Amoxicillin-Pot Clavulanate] Swelling    tongue   Biaxin  [Clarithromycin] Swelling and Rash    tongue    Current Meds  Medication Sig   acetaminophen (TYLENOL) 325 MG tablet Take by mouth.   albuterol (VENTOLIN HFA) 108 (90 Base) MCG/ACT inhaler TAKE 2 PUFFS BY MOUTH EVERY 6 HOURS AS NEEDED FOR WHEEZE OR SHORTNESS OF BREATH   atorvastatin (LIPITOR) 10 MG tablet TAKE 1 TABLET BY MOUTH EVERY DAY   benazepril (LOTENSIN) 40 MG tablet Take 1 tablet (40 mg total) by mouth daily.   BREZTRI AEROSPHERE 160-9-4.8 MCG/ACT AERO INHALE 2 PUFFS INTO THE LUNGS IN THE MORNING AND AT BEDTIME.   cholecalciferol (VITAMIN D3) 25 MCG (1000 UNIT) tablet Take 1,000 Units by mouth daily.   Cyanocobalamin (VITAMIN B-12) 5000 MCG TBDP Take 1 tablet by mouth daily at 2 PM.   hydrocortisone (ANUSOL-HC) 25 MG suppository Short term use as needed.  Home med.   mometasone (NASONEX) 50 MCG/ACT nasal spray USE 1 SPRAY TO EACH NOSTRIL TWICE A DAY   Multiple Vitamins-Minerals (MULTIVITAMIN WITH MINERALS) tablet Take 1 tablet by mouth daily.   PARoxetine (PAXIL) 30 MG tablet Take 1 tablet (30 mg total) by mouth daily.   zanubrutinib (BRUKINSA) 80 MG capsule Take 1 capsule (80 mg total) by mouth daily.    Immunization History  Administered Date(s) Administered   Fluad Quad(high Dose 65+) 11/08/2019, 11/12/2020, 12/09/2021   Influenza, High Dose Seasonal PF 01/04/2017, 11/07/2018, 11/16/2022   Influenza,inj,Quad PF,6+ Mos 01/06/2015, 11/21/2015, 10/07/2017   Influenza-Unspecified 01/06/2015, 11/21/2015, 01/04/2017, 10/07/2017, 11/07/2018, 12/03/2020   PFIZER(Purple Top)SARS-COV-2 Vaccination 05/11/2019, 06/01/2019, 02/05/2020, 12/07/2022   Pneumococcal Conjugate-13 03/06/2014   Pneumococcal-Unspecified 02/22/1993, 02/23/2003   Rsv, Bivalent, Protein Subunit Rsvpref,pf Verdis Frederickson) 02/12/2022   Td 07/24/2003   Tdap 11/18/2010   Zoster Recombinant(Shingrix) 08/02/2017, 02/04/2018, 05/27/2022   Zoster, Live 11/03/2005        Objective:     BP 118/78 (BP Location: Right Arm,  Cuff Size: Normal)   Pulse 68   Temp 98.7 F (37.1 C)   Ht 5\' 6"  (1.676 m)   Wt 140 lb 6.4 oz (63.7 kg)   SpO2 98%   BMI 22.66 kg/m   SpO2: 98 % O2 Device: None (Room air)  GENERAL: Well-developed, thin woman, well-groomed, spry, no acute distress, fully ambulatory.  Stational dyspnea. HEAD: Normocephalic, atraumatic.  EYES: Pupils equal, round, reactive to light.  No scleral icterus.  MOUTH: Oral mucosa moist.  Dentition intact. NECK: Supple. No thyromegaly. Trachea midline. No JVD.  No adenopathy. PULMONARY: Good air entry bilaterally.  Coarse breath sounds otherwise, no adventitious sounds. CARDIOVASCULAR: S1 and S2. Regular rate and rhythm.  No rubs, murmurs or gallops heard. ABDOMEN: Benign. MUSCULOSKELETAL: No joint deformity, no clubbing, 1+ edema of the feet/ankles noted today, this is chronic. NEUROLOGIC: No focal deficit noted, no gait disturbance, speech is fluent. SKIN: Intact,warm,dry.  Multiple ecchymosis/senile purpura particularly upper extremities. PSYCH: Mood and behavior normal.  Independently reviewed patient's imaging from 13 October 2022 showing improved aeration of the right middle lobe with residual mild platelike scarring versus atelectasis of the right middle lobe.  Chronic thin scarring/parenchymal bands in the lingula and lower lobes compatible with nonspecific mild postinfectious/postinflammatory scarring.  Small to moderate right Bochdalek hernia.  Overall improved from prior.   Assessment & Plan:     ICD-10-CM   1. Bronchiectasis without complication (HCC)  J47.9     2. Stage 2 moderate COPD by GOLD classification (HCC)  J44.9     3. CLL (chronic lymphocytic leukemia) (HCC)  C91.10     4. IgG deficiency (HCC) -secondary  D80.3         Bronchiectasis Patient has been using the InCourage vest inconsistently due to a recent hospitalization for diverticular bleed and associated diarrhea. Chest CT in August showed improvement with clearing of  previously collapsed areas. -Resume use of InCorage vest once daily after breakfast to prevent mucus plug formation and lung collapse.  COPD Stable on Breztri. -Continue Breztri as prescribed.  Diverticular bleed Recent hospitalization. No current symptoms. -No specific plan discussed.  Chronic Lymphocytic Leukemia (CLL) Noted ecchymosis likely related to CLL. -Recommend discussing with Dr. Donneta Romberg regarding possible vitamin C and zinc deficiency.  General Health Maintenance -Confirmed up to date on vaccinations including COVID, flu, and RSV. -Next follow-up in 4-6 months.      Gailen Shelter, MD Advanced Bronchoscopy PCCM [facility]    *This note was generated using voice recognition software/Dragon and/or AI transcription program.  Despite best efforts to proofread, errors can occur which can change the meaning. Any transcriptional errors that result from this process are unintentional and may not be fully corrected at the time of dictation.

## 2022-12-29 NOTE — Patient Instructions (Signed)
VISIT SUMMARY:  During today's visit, we reviewed your recent health concerns and ongoing treatments. You have a history of bronchiectasis, COPD, and CLL. We discussed your recent hospitalization for a diverticular bleed and the subsequent pause in using the InCourage vest. Your chest CT from August showed improvement, and you are continuing with [facility] for COPD management. We also addressed your recent skin surgery, which was benign, and your current experience with ecchymosis throughout the body. Additionally, we confirmed that you are up to date on your vaccinations.  YOUR PLAN:  -BRONCHIECTASIS: Bronchiectasis is a condition where the airways in the lungs become widened, leading to mucus build-up and frequent infections. You should resume using the InCourage vest once daily after breakfast to help prevent mucus plug formation and lung collapse.  -COPD: Chronic Obstructive Pulmonary Disease (COPD) is a chronic inflammatory lung disease that obstructs airflow from the lungs. Continue using Breztri as prescribed to manage your symptoms.  -CHRONIC LYMPHOCYTIC LEUKEMIA (CLL): CLL is a type of cancer that affects the blood and bone marrow. You have noted ecchymosis, which is likely related to CLL. It is recommended to discuss with Dr. Donneta Romberg about the possibility of vitamin C and zinc deficiency.  -GENERAL HEALTH MAINTENANCE: You are up to date on your vaccinations, including COVID, flu, and RSV. Your next follow-up appointment should be in 4-6 months.  INSTRUCTIONS:  Please resume using the InCourage vest once daily do not do it immediately after a meal wait at least 2 hours after eating. Continue taking Breztri as prescribed. Discuss with Dr. Donneta Romberg about the possibility of vitamin C and zinc deficiency related to your CLL. Schedule your next follow-up appointment in 4-6 months.

## 2023-01-21 ENCOUNTER — Other Ambulatory Visit (HOSPITAL_COMMUNITY): Payer: Self-pay

## 2023-01-26 NOTE — Progress Notes (Signed)
Specialty Pharmacy Ongoing Clinical Assessment Note  Katherine [name] is a 82 y.o. female who is being followed by the specialty pharmacy service for RxSp Oncology   Patient's specialty medication(s) reviewed today: No data recorded  Missed doses in the last 4 weeks: No data recorded  Patient/Caregiver did not have any additional questions or concerns.   No data recorded  No data recorded  No data recorded   Goals Addressed   None     Follow up:  3 months  Remi Haggard Specialty Pharmacist

## 2023-02-02 ENCOUNTER — Ambulatory Visit: Payer: Medicare HMO | Admitting: Internal Medicine

## 2023-02-02 ENCOUNTER — Other Ambulatory Visit: Payer: Medicare HMO

## 2023-02-03 ENCOUNTER — Other Ambulatory Visit: Payer: Self-pay | Admitting: Internal Medicine

## 2023-02-03 ENCOUNTER — Encounter: Payer: Self-pay | Admitting: Internal Medicine

## 2023-02-03 ENCOUNTER — Other Ambulatory Visit (HOSPITAL_COMMUNITY): Payer: Self-pay

## 2023-02-03 ENCOUNTER — Other Ambulatory Visit: Payer: Self-pay

## 2023-02-03 ENCOUNTER — Inpatient Hospital Stay: Payer: Medicare HMO | Admitting: Internal Medicine

## 2023-02-03 ENCOUNTER — Inpatient Hospital Stay: Payer: Medicare HMO | Attending: Internal Medicine

## 2023-02-03 DIAGNOSIS — C9110 Chronic lymphocytic leukemia of B-cell type not having achieved remission: Secondary | ICD-10-CM

## 2023-02-03 DIAGNOSIS — D849 Immunodeficiency, unspecified: Secondary | ICD-10-CM | POA: Insufficient documentation

## 2023-02-03 DIAGNOSIS — J9811 Atelectasis: Secondary | ICD-10-CM | POA: Insufficient documentation

## 2023-02-03 DIAGNOSIS — Z9049 Acquired absence of other specified parts of digestive tract: Secondary | ICD-10-CM | POA: Insufficient documentation

## 2023-02-03 DIAGNOSIS — J449 Chronic obstructive pulmonary disease, unspecified: Secondary | ICD-10-CM | POA: Insufficient documentation

## 2023-02-03 DIAGNOSIS — M7989 Other specified soft tissue disorders: Secondary | ICD-10-CM | POA: Diagnosis not present

## 2023-02-03 DIAGNOSIS — K922 Gastrointestinal hemorrhage, unspecified: Secondary | ICD-10-CM | POA: Insufficient documentation

## 2023-02-03 DIAGNOSIS — D500 Iron deficiency anemia secondary to blood loss (chronic): Secondary | ICD-10-CM | POA: Insufficient documentation

## 2023-02-03 DIAGNOSIS — R233 Spontaneous ecchymoses: Secondary | ICD-10-CM | POA: Insufficient documentation

## 2023-02-03 DIAGNOSIS — Z87891 Personal history of nicotine dependence: Secondary | ICD-10-CM | POA: Insufficient documentation

## 2023-02-03 DIAGNOSIS — Z8701 Personal history of pneumonia (recurrent): Secondary | ICD-10-CM | POA: Diagnosis not present

## 2023-02-03 LAB — CMP (CANCER CENTER ONLY)
ALT: 10 U/L (ref 0–44)
AST: 18 U/L (ref 15–41)
Albumin: 4.6 g/dL (ref 3.5–5.0)
Alkaline Phosphatase: 78 U/L (ref 38–126)
Anion gap: 8 (ref 5–15)
BUN: 16 mg/dL (ref 8–23)
CO2: 26 mmol/L (ref 22–32)
Calcium: 9 mg/dL (ref 8.9–10.3)
Chloride: 107 mmol/L (ref 98–111)
Creatinine: 0.91 mg/dL (ref 0.44–1.00)
GFR, Estimated: >60 mL/min (ref >60)
Glucose, Bld: 101 mg/dL — ABNORMAL HIGH (ref 70–99)
Potassium: 4.2 mmol/L (ref 3.5–5.1)
Sodium: 141 mmol/L (ref 135–145)
Total Bilirubin: 0.8 mg/dL (ref <1.2)
Total Protein: 6.1 g/dL — ABNORMAL LOW (ref 6.5–8.1)

## 2023-02-03 LAB — CBC WITH DIFFERENTIAL (CANCER CENTER ONLY)
Abs Immature Granulocytes: 0.47 10*3/uL — ABNORMAL HIGH (ref 0.00–0.07)
Basophils Absolute: 0 10*3/uL (ref 0.0–0.1)
Basophils Relative: 0 %
Eosinophils Absolute: 0.1 10*3/uL (ref 0.0–0.5)
Eosinophils Relative: 0 %
HCT: 37 % (ref 36.0–46.0)
Hemoglobin: 10.4 g/dL — ABNORMAL LOW (ref 12.0–15.0)
Immature Granulocytes: 0 %
Lymphocytes Relative: 99 %
Lymphs Abs: 282.9 10*3/uL — ABNORMAL HIGH (ref 0.7–4.0)
MCH: 31 pg (ref 26.0–34.0)
MCHC: 28.1 g/dL — ABNORMAL LOW (ref 30.0–36.0)
MCV: 110.1 fL — ABNORMAL HIGH (ref 80.0–100.0)
Monocytes Absolute: 0.7 10*3/uL (ref 0.1–1.0)
Monocytes Relative: 0 %
Neutro Abs: 3 10*3/uL (ref 1.7–7.7)
Neutrophils Relative %: 1 %
Platelet Count: 115 10*3/uL — ABNORMAL LOW (ref 150–400)
RBC: 3.36 MIL/uL — ABNORMAL LOW (ref 3.87–5.11)
Smear Review: NORMAL
WBC Count: 287.2 10*3/uL (ref 4.0–10.5)
nRBC: 0 % (ref 0.0–0.2)

## 2023-02-03 LAB — PROTIME-INR
INR: 1.1 (ref 0.8–1.2)
Prothrombin Time: 14.4 s (ref 11.4–15.2)

## 2023-02-03 LAB — IRON AND TIBC
Iron: 85 ug/dL (ref 28–170)
Saturation Ratios: 22 % (ref 10.4–31.8)
TIBC: 384 ug/dL (ref 250–450)
UIBC: 299 ug/dL

## 2023-02-03 LAB — FERRITIN: Ferritin: 38 ng/mL (ref 11–307)

## 2023-02-03 LAB — LACTATE DEHYDROGENASE: LDH: 100 U/L (ref 98–192)

## 2023-02-03 MED ORDER — BRUKINSA 80 MG PO CAPS
80.0000 mg | ORAL_CAPSULE | Freq: Every day | ORAL | 1 refills | Status: DC
  Filled 2023-02-03 – 2023-02-04 (×2): qty 30, 30d supply, fill #0
  Filled 2023-02-24: qty 30, 30d supply, fill #1

## 2023-02-03 NOTE — Assessment & Plan Note (Addendum)
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 Deletion-poor tolerance to Ibrutinib-hematuria; ; and Acalburtinib [stopped in March 2022 because of diarrhea]. # AUG 21st, 2024- CT chest [Dr.Gonzalez]- Partially visualized splenomegaly, increased-again suggestive of progressive CLL.  Spleen up to umbilicus. Currently on Zanubrutinib 80 mg In AM [for last 3 weeks- SEP mid 2024].   # Patient tolerating Zanu 80 mg a day fairly well-monitor closely in the context of recent GI bleed see below.  White count 287-improving thrombocytopenia improved.  See below regarding anemia  # Anemia multifactorial CLL/GI bleed.  No hemolysis. Continue  gentle iron [iron biglycinate; 28 mg ] 1 pill a day-today hemoglobin is 10 improved from a baseline of 8.   # Bil LE swelling: sec to Zanubrutinib- G-1-2; recommend Stocking/ leg elevation.   # Diverticular bleed [OCT 2024]; s/p hemi-colectomy [Dr.Byrnett] sec to polyps. Colo >>> years ago.  Monitor closely.  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   DEC 2023- immunoglobulins- IgG- 283. Patient declines given concerns of diarrhea./Poor tolerance.  #COPD/history of pneumonia -bilateral Jan 2023 [QUITsmoking-April, 2023; [facility]]-right middle lobe lung atelectasis [Dr.Gonzalez] - AUG 2024- CT- Improved aeration of the right middle lobe with residual mild plate like scarring versus atelectasis in the right middle lobe. Stable.   # easy brusing sec to zanurbitinib- also check PT/PTT  # Ongoing fatigue multifactorial-underlying chronic respiratory failure; underlying CLL; possible depression/anxiety.   # Vaccination:s/p flu shot; ok with  covid for now.   # left Nodular growth [x1 month]- s/p  excision for  SCC-  dicussed with Dr.Graham-   # DISPOSITION:  # Follow up in 2 months- MD: Labs- cbc/cmp; [facility]; Dr.B

## 2023-02-03 NOTE — Progress Notes (Signed)
Received critical lab results; WBC 287.2 by Chase in lab. Read Back. Dr B was notified of results.

## 2023-02-03 NOTE — Progress Notes (Signed)
Here with her sister Bonita Quin today.  C/o swelling in feet and ankles. She would like you to look at the red spots on her legs and arms.

## 2023-02-03 NOTE — Progress Notes (Signed)
[facility] CONSULT NOTE  Patient Care Team: Marjie Skiff, NP as PCP - General (Nurse Practitioner) Debbrah Alar, MD (Dermatology) Scot Jun, MD (Inactive) (Gastroenterology) Lemar Livings Merrily Pew, MD (General Surgery) Earna Coder, MD as Consulting Physician (Oncology) Salena Saner, MD as Consulting Physician (Pulmonary Disease)  CHIEF COMPLAINTS/PURPOSE OF CONSULTATION:  CLL  #  Oncology History Overview Note  Katherine [name] is a 82 y.o. female with chronic lymphocytic leukemia (CLL).  WBC has ranged between 22,000 - 101,7000 since 05/2011.   She has received Rituxan x 2 four week cycles (06/27/2015 and 05/27/2017).  Hepatitis B surface antigen and hepatitis B surface antibody were negative on 07/04/2015.   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  CCND1, ATM, chromosome 12, and TP53 were normal.     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  There was a CD5 and CD23 positive monoclonal B cell population with lambda light chain restriction, negative for FMC7 and CD38, representing  88% of leukocytes, >5,000/uL. There was no loss of, or aberrant expression of, the pan T cell  antigens to  suggest a neoplastic T cell process. CD4:CD8 ratio 2.0  No circulating blasts were detected. There was no immunophenotypic evidence of abnormal myeloid maturation.  IGH/BCL2 by FISH is pending. --------------------------------------------------------  She began ibrutinib on 04/24/2019 (discontinued on 05/30/2019).                     She developed blood-streaked sputum (slight) then significant hematuria.                     She declined further ibrutinib. ---------------------------------------------------------------------------------            She began acalabrutinib on 03/14/2020 (held on 05/13/2020 secondary  to diarrhea). --------------------------------------------------------------------------------------   She has a 35 pack year smoking history.  She is in the low dose chest CT program.  Low dose chest CT on 02/02/2018 revealed a new endobronchial lesion in the segmental bronchus to the medial segment of the right middle lobe. This may simply reflect an area of retained secretions, however, the possibility of an endobronchial neoplasm should be considered. Lung-RADS 4AS, suspicious.  Low dose chest CT on 07/05/2018 revealed Lung-RADS 2, benign appearance or behavior.  Prior right middle lobe endobronchial lesion/mucous plugging was no longer visualized.  New mucous plugging in the right lower lobe.  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  Accompanied by her sister, Katherine  Beila [name] 82 y.o.  female CLL-13 q. Deletion currently on Brukinsa/Zanubrutinib [started in sep 2024 ]; COPD- SCC of skin-is here for follow-up.  Patient complains of ongoing swelling in feet and ankles.  Also complains of easy bruising under extremities.  She admits to compliance with her zanu pill.  Compliance with her iron pill.  Continues to chronic fatigue.  Otherwise denies any blood in stools or black-colored stools.  Otherwise denies any diarrhea.  Denies any rash.  Review of Systems  Constitutional:  Positive for malaise/fatigue. Negative for chills, diaphoresis, fever and weight loss.  HENT:  Negative for nosebleeds and sore throat.   Eyes:  Negative for double vision.  Respiratory:  Positive for hemoptysis and shortness of breath. Negative for cough, sputum production and wheezing.   Cardiovascular:  Negative for  chest pain, palpitations, orthopnea and leg swelling.  Gastrointestinal:  Negative for abdominal pain, blood in stool, constipation, diarrhea, heartburn, melena, nausea and vomiting.  Genitourinary:  Negative for dysuria, frequency and urgency.  Musculoskeletal:  Positive for back pain. Negative for joint pain.  Skin: Negative.  Negative for itching and rash.  Neurological:  Negative for dizziness, tingling, focal weakness, weakness and headaches.  Endo/Heme/Allergies:  Does not bruise/bleed easily.  Psychiatric/Behavioral:  Negative for depression. The patient is not nervous/anxious and does not have insomnia.      MEDICAL HISTORY:  Past Medical History:  Diagnosis Date   Allergy    Anxiety    Depression    GERD (gastroesophageal reflux disease)    Hemorrhoids    Hypertension    Leukemia, lymphoid (HCC)    CLL   Lobar pneumonia (HCC)    Osteopenia    Personal history of chemotherapy     SURGICAL HISTORY: Past Surgical History:  Procedure Laterality Date   ABDOMINAL HYSTERECTOMY     APPENDECTOMY     BREAST BIOPSY Left    bx x 3-neg   COLON SURGERY     sigmoid resection   COLONOSCOPY     2007, 2012   COLONOSCOPY WITH PROPOFOL N/A 09/17/2015   Procedure: COLONOSCOPY WITH PROPOFOL;  Surgeon: Earline Mayotte, MD;  Location: [facility] ENDOSCOPY;  Service: Endoscopy;  Laterality: N/A;   OOPHORECTOMY     SPINE SURGERY     L4-5    SOCIAL HISTORY: Social History   Socioeconomic History   Marital status: Married    Spouse name: Not on file   Number of children: Not on file   Years of education: 12   Highest education level: 12th grade  Occupational History   Not on file  Tobacco Use   Smoking status: Former    Current packs/day: 0.00    Average packs/day: 1 pack/day for 35.0 years (35.0 ttl pk-yrs)    Types: Cigarettes    Start date: 02/22/1986    Quit date: 02/22/2021    Years since quitting: 1.9   Smokeless tobacco: Never  Vaping Use   Vaping status: Never Used   Substance and Sexual Activity   Alcohol use: No    Alcohol/week: 0.0 standard drinks of alcohol   Drug use: No   Sexual activity: Not on file  Other Topics Concern   Not on file  Social History Narrative   Not on file   Social Drivers of Health   Financial Resource Strain: Low Risk  (06/10/2022)   Overall Financial Resource Strain (CARDIA)    Difficulty of Paying Living Expenses: Not hard at all  Food Insecurity: No Food Insecurity (11/20/2022)   Hunger Vital Sign    Worried About Running Out of Food in the Last Year: Never true    Ran Out of Food in the Last Year: Never true  Transportation Needs: No Transportation Needs (11/20/2022)   PRAPARE - Administrator, Civil Service (Medical): No    Lack of Transportation (Non-Medical): No  Physical Activity: Insufficiently Active (06/10/2022)   Exercise Vital Sign    Days of Exercise per Week: 2 days    Minutes of Exercise per Session: 20 min  Stress: No Stress Concern Present (06/10/2022)   [employer] of Occupational Health - Occupational Stress Questionnaire  Feeling of Stress : Only a little  Social Connections: Moderately Integrated (06/10/2022)   Social Connection and Isolation Panel [NHANES]    Frequency of Communication with Friends and Family: More than three times a week    Frequency of Social Gatherings with Friends and Family: More than three times a week    Attends Religious Services: More than 4 times per year    Active Member of [employer] or Organizations: No    Attends Banker Meetings: Never    Marital Status: Married  Catering manager Violence: Not At Risk (11/20/2022)   Humiliation, Afraid, Rape, and Kick questionnaire    Fear of Current or Ex-Partner: No    Emotionally Abused: No    Physically Abused: No    Sexually Abused: No    FAMILY HISTORY: Family History  Problem Relation Age of Onset   Osteoporosis Mother    Hypertension Father    Heart attack Father    Stroke Maternal  Grandfather    Breast cancer Sister 46    ALLERGIES:  is allergic to augmentin [amoxicillin-pot clavulanate] and biaxin [clarithromycin].  MEDICATIONS:  Current Outpatient Medications  Medication Sig Dispense Refill   acetaminophen (TYLENOL) 325 MG tablet Take by mouth.     albuterol (VENTOLIN HFA) 108 (90 Base) MCG/ACT inhaler TAKE 2 PUFFS BY MOUTH EVERY 6 HOURS AS NEEDED FOR WHEEZE OR SHORTNESS OF BREATH 18 each 59   atorvastatin (LIPITOR) 10 MG tablet TAKE 1 TABLET BY MOUTH EVERY DAY 90 tablet 3   benazepril (LOTENSIN) 40 MG tablet Take 1 tablet (40 mg total) by mouth daily. 90 tablet 4   BREZTRI AEROSPHERE 160-9-4.8 MCG/ACT AERO INHALE 2 PUFFS INTO THE LUNGS IN THE MORNING AND AT BEDTIME. 10.7 each 11   cholecalciferol (VITAMIN D3) 25 MCG (1000 UNIT) tablet Take 1,000 Units by mouth daily.     Cyanocobalamin (VITAMIN B-12) 5000 MCG TBDP Take 1 tablet by mouth daily at 2 PM.     hydrocortisone (ANUSOL-HC) 25 MG suppository Short term use as needed.  Home med.     mometasone (NASONEX) 50 MCG/ACT nasal spray USE 1 SPRAY TO EACH NOSTRIL TWICE A DAY 51 each 1   Multiple Vitamins-Minerals (MULTIVITAMIN WITH MINERALS) tablet Take 1 tablet by mouth daily.     PARoxetine (PAXIL) 30 MG tablet Take 1 tablet (30 mg total) by mouth daily. 90 tablet 4   zanubrutinib (BRUKINSA) 80 MG capsule Take 1 capsule (80 mg total) by mouth daily. 30 capsule 1   No current facility-administered medications for this visit.      Marland Kitchen  PHYSICAL EXAMINATION: ECOG PERFORMANCE STATUS: 1 - Symptomatic but completely ambulatory  Vitals:   02/03/23 1022  BP: 135/72  Pulse: 72  Temp: (!) 97.2 F (36.2 C)  SpO2: 99%    Filed Weights   02/03/23 1022  Weight: 142 lb (64.4 kg)   Spleen-up to umbilicus.  Physical Exam HENT:     Head: Normocephalic and atraumatic.     Mouth/Throat:     Pharynx: No oropharyngeal exudate.  Eyes:     Pupils: Pupils are equal, round, and reactive to light.  Cardiovascular:      Rate and Rhythm: Normal rate and regular rhythm.  Pulmonary:     Effort: No respiratory distress.     Breath sounds: No wheezing.     Comments: Decreased air entry bilaterally at the bases. Abdominal:     General: Bowel sounds are normal. There is no distension.  Palpations: Abdomen is soft. There is no mass.     Tenderness: There is no abdominal tenderness. There is no guarding or rebound.  Musculoskeletal:        General: No tenderness. Normal range of motion.     Cervical back: Normal range of motion and neck supple.  Skin:    General: Skin is warm.  Neurological:     Mental Status: She is alert and oriented to person, place, and time.  Psychiatric:        Mood and Affect: Affect normal.      LABORATORY DATA:  I have reviewed the data as listed Lab Results  Component Value Date   WBC 287.2 (HH) 02/03/2023   HGB 10.4 (L) 02/03/2023   HCT 37.0 02/03/2023   MCV 110.1 (H) 02/03/2023   PLT 115 (L) 02/03/2023   Recent Labs    11/22/22 0634 12/01/22 1008 12/10/22 1032 02/03/23 1005  NA 141 139 142 141  K 3.2* 4.2 4.0 4.2  CL 111 106 105 107  CO2 23 27 24 26   GLUCOSE 105* 111* 87 101*  BUN 9 14 13 16   CREATININE 0.58 0.90 1.07* 0.91  CALCIUM 8.5* 9.3 9.4 9.0  GFRNONAA >60 >60  --  >60  PROT  --  6.2* 6.4 6.1*  ALBUMIN  --  4.3 4.8* 4.6  AST  --  20 18 18   ALT  --  13 11 10   ALKPHOS  --  110 131* 78  BILITOT  --  0.9 0.7 0.8    RADIOGRAPHIC STUDIES: I have personally reviewed the radiological images as listed and agreed with the findings in the report. No results found.  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 Deletion-poor tolerance to Ibrutinib-hematuria; ; and Acalburtinib [stopped in March 2022 because of diarrhea]. # AUG 21st, 2024- CT chest [Dr.Gonzalez]- Partially visualized splenomegaly, increased-again suggestive of progressive CLL.  Spleen up to umbilicus. Currently on Zanubrutinib 80 mg In AM  [for last 3 weeks- SEP mid 2024].   # Patient tolerating Zanu 80 mg a day fairly well-monitor closely in the context of recent GI bleed see below.  White count 287-improving thrombocytopenia improved.  See below regarding anemia  # Anemia multifactorial CLL/GI bleed.  No hemolysis. Continue  gentle iron [iron biglycinate; 28 mg ] 1 pill a day-today hemoglobin is 10 improved from a baseline of 8.   # Bil LE swelling: sec to Zanubrutinib- G-1-2; recommend Stocking/ leg elevation.   # Diverticular bleed [OCT 2024]; s/p hemi-colectomy [Dr.Byrnett] sec to polyps. Colo >>> years ago.  Monitor closely.  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   DEC 2023- immunoglobulins- IgG- 283. Patient declines given concerns of diarrhea./Poor tolerance.  #COPD/history of pneumonia -bilateral Jan 2023 [QUITsmoking-April, 2023; [facility]]-right middle lobe lung atelectasis [Dr.Gonzalez] - AUG 2024- CT- Improved aeration of the right middle lobe with residual mild plate like scarring versus atelectasis in the right middle lobe. Stable.   # easy brusing sec to zanurbitinib- also check PT/PTT  # Ongoing fatigue multifactorial-underlying chronic respiratory failure; underlying CLL; possible depression/anxiety.   # Vaccination:s/p flu shot; ok with  covid for now.   # left Nodular growth [x1 month]- s/p  excision for  SCC-  dicussed with Dr.Graham-   # DISPOSITION:  # Follow up in 2 months- MD: Labs- cbc/cmp; LDH; Dr.B  All questions were answered. The patient knows to call the clinic with any problems, questions or concerns.   Earna Coder, MD 02/03/2023 11:35 AM

## 2023-02-04 ENCOUNTER — Other Ambulatory Visit: Payer: Self-pay

## 2023-02-04 LAB — PTT FACTOR INHIBITOR (MIXING STUDY): aPTT: 23.4 s (ref 22.9–30.2)

## 2023-02-04 NOTE — Progress Notes (Signed)
Specialty Pharmacy Refill Coordination Note  Katherine [name] is a 82 y.o. female contacted today regarding refills of specialty medication(s) Zanubrutinib [name])   Patient requested Delivery   Delivery date: 02/07/23   Verified address: [address] [place], [place] [address]   Medication will be filled on 12.13.24.

## 2023-02-24 ENCOUNTER — Other Ambulatory Visit (HOSPITAL_COMMUNITY): Payer: Self-pay

## 2023-02-24 ENCOUNTER — Other Ambulatory Visit: Payer: Self-pay

## 2023-02-24 NOTE — Progress Notes (Signed)
 Specialty Pharmacy Refill Coordination Note  Katherine [name] is a 83 y.o. female contacted today regarding refills of specialty medication(s) Zanubrutinib  (Brukinsa )   Patient requested Delivery   Delivery date: 03/01/23   Verified address: [address] [place] [address]   Medication will be filled on 02/28/23.

## 2023-03-01 ENCOUNTER — Other Ambulatory Visit: Payer: Self-pay

## 2023-03-18 ENCOUNTER — Other Ambulatory Visit: Payer: Self-pay

## 2023-03-23 ENCOUNTER — Other Ambulatory Visit: Payer: Self-pay

## 2023-03-31 ENCOUNTER — Other Ambulatory Visit: Payer: Self-pay | Admitting: Pulmonary Disease

## 2023-04-06 ENCOUNTER — Encounter: Payer: Self-pay | Admitting: Internal Medicine

## 2023-04-06 ENCOUNTER — Inpatient Hospital Stay: Payer: Medicare HMO | Attending: Internal Medicine

## 2023-04-06 ENCOUNTER — Inpatient Hospital Stay (HOSPITAL_BASED_OUTPATIENT_CLINIC_OR_DEPARTMENT_OTHER): Payer: Medicare HMO | Admitting: Internal Medicine

## 2023-04-06 VITALS — BP 131/63 | HR 80 | Temp 97.8°F | Ht 66.0 in | Wt 141.0 lb

## 2023-04-06 DIAGNOSIS — C9110 Chronic lymphocytic leukemia of B-cell type not having achieved remission: Secondary | ICD-10-CM | POA: Diagnosis not present

## 2023-04-06 DIAGNOSIS — M25471 Effusion, right ankle: Secondary | ICD-10-CM | POA: Insufficient documentation

## 2023-04-06 DIAGNOSIS — Z803 Family history of malignant neoplasm of breast: Secondary | ICD-10-CM | POA: Diagnosis not present

## 2023-04-06 DIAGNOSIS — M25472 Effusion, left ankle: Secondary | ICD-10-CM | POA: Diagnosis not present

## 2023-04-06 DIAGNOSIS — R233 Spontaneous ecchymoses: Secondary | ICD-10-CM | POA: Diagnosis not present

## 2023-04-06 DIAGNOSIS — J449 Chronic obstructive pulmonary disease, unspecified: Secondary | ICD-10-CM | POA: Insufficient documentation

## 2023-04-06 DIAGNOSIS — Z8701 Personal history of pneumonia (recurrent): Secondary | ICD-10-CM | POA: Diagnosis not present

## 2023-04-06 DIAGNOSIS — Z87891 Personal history of nicotine dependence: Secondary | ICD-10-CM | POA: Diagnosis not present

## 2023-04-06 DIAGNOSIS — R197 Diarrhea, unspecified: Secondary | ICD-10-CM | POA: Insufficient documentation

## 2023-04-06 DIAGNOSIS — D8489 Other immunodeficiencies: Secondary | ICD-10-CM | POA: Insufficient documentation

## 2023-04-06 LAB — CMP (CANCER CENTER ONLY)
ALT: 13 U/L (ref 0–44)
AST: 18 U/L (ref 15–41)
Albumin: 4.4 g/dL (ref 3.5–5.0)
Alkaline Phosphatase: 70 U/L (ref 38–126)
Anion gap: 10 (ref 5–15)
BUN: 15 mg/dL (ref 8–23)
CO2: 24 mmol/L (ref 22–32)
Calcium: 9.3 mg/dL (ref 8.9–10.3)
Chloride: 104 mmol/L (ref 98–111)
Creatinine: 0.9 mg/dL (ref 0.44–1.00)
GFR, Estimated: >60 mL/min (ref >60)
Glucose, Bld: 98 mg/dL (ref 70–99)
Potassium: 4 mmol/L (ref 3.5–5.1)
Sodium: 138 mmol/L (ref 135–145)
Total Bilirubin: 1 mg/dL (ref 0.0–1.2)
Total Protein: 6.2 g/dL — ABNORMAL LOW (ref 6.5–8.1)

## 2023-04-06 LAB — CBC WITH DIFFERENTIAL (CANCER CENTER ONLY)
Abs Immature Granulocytes: 0.24 10*3/uL — ABNORMAL HIGH (ref 0.00–0.07)
Basophils Absolute: 0.1 10*3/uL (ref 0.0–0.1)
Basophils Relative: 0 %
Eosinophils Absolute: 0.1 10*3/uL (ref 0.0–0.5)
Eosinophils Relative: 0 %
HCT: 38.8 % (ref 36.0–46.0)
Hemoglobin: 11.6 g/dL — ABNORMAL LOW (ref 12.0–15.0)
Immature Granulocytes: 0 %
Lymphocytes Relative: 99 %
Lymphs Abs: 175.6 10*3/uL — ABNORMAL HIGH (ref 0.7–4.0)
MCH: 31.1 pg (ref 26.0–34.0)
MCHC: 29.9 g/dL — ABNORMAL LOW (ref 30.0–36.0)
MCV: 104 fL — ABNORMAL HIGH (ref 80.0–100.0)
Monocytes Absolute: 0.6 10*3/uL (ref 0.1–1.0)
Monocytes Relative: 0 %
Neutro Abs: 2.3 10*3/uL (ref 1.7–7.7)
Neutrophils Relative %: 1 %
Platelet Count: 106 10*3/uL — ABNORMAL LOW (ref 150–400)
RBC: 3.73 MIL/uL — ABNORMAL LOW (ref 3.87–5.11)
Smear Review: NORMAL
WBC Count: 178.9 10*3/uL (ref 4.0–10.5)
nRBC: 0 % (ref 0.0–0.2)

## 2023-04-06 LAB — LACTATE DEHYDROGENASE: LDH: 106 U/L (ref 98–192)

## 2023-04-06 MED ORDER — DIPHENOXYLATE-ATROPINE 2.5-0.025 MG PO TABS: 1.0000 | ORAL_TABLET | Freq: Four times a day (QID) | ORAL | 1 refills | Status: AC | PRN

## 2023-04-06 NOTE — Progress Notes (Signed)
Critical wbc 178.9 called by Almeta Monas in lab; read back Dr B notified

## 2023-04-06 NOTE — Progress Notes (Signed)
Pt states Brukinsa is giving her diarrhea. Having 3-4 Bms per day. Has taken 2 anti-diarrhea pills this morning.  Ankles are swelling. Has a rash on most of her body, no symptoms.

## 2023-04-06 NOTE — Assessment & Plan Note (Addendum)
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 Deletion-poor tolerance to Ibrutinib-hematuria; ; and Acalburtinib [stopped in March 2022 because of diarrhea]. # AUG 21st, 2024- CT chest [Dr.Gonzalez]- Partially visualized splenomegaly, increased-again suggestive of progressive CLL.  Spleen up to umbilicus. Currently on Zanubrutinib 80 mg In AM [for last 3 weeks- SEP mid 2024].   # Patient tolerating Zanu 80 mg a day fairly well-monitor closely in the context of recent GI bleed see below.  White count 287-improving thrombocytopenia improved.  See below regarding anemia  # Anemia multifactorial CLL/GI bleed.  No hemolysis. Continue  gentle iron [iron biglycinate; 28 mg ] 1 pill a day-today hemoglobin is 11  improved from a baseline of 8.  # Bil LE swelling: sec to Zanubrutinib- G-1-2; recommend Stocking/ leg elevation.   # Diarrhea grade 1 secondary to zabu-recommend in addition to Imodium recommend Lomotil.  Prescription sent.  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   DEC 2023- immunoglobulins- IgG- 283. Patient declines given concerns of diarrhea./Poor tolerance.  #COPD/history of pneumonia -bilateral Jan 2023 [QUITsmoking-April, 2023; [facility]]-right middle lobe lung atelectasis [Dr.Gonzalez] - AUG 2024- CT- Improved aeration of the right middle lobe with residual mild plate like scarring versus atelectasis in the right middle lobe. table.   # Easy brusing sec to zanurbitinib-stable.   # Vaccination:s/p flu shot; ok with  covid for now.   # DISPOSITION:  # Follow up in 2 months- MD: Labs- cbc/cmp; LDH;quantitive immunoglobulin- Dr.B

## 2023-04-06 NOTE — Progress Notes (Signed)
[facility] CONSULT NOTE  Patient Care Team: Marjie Skiff, NP as PCP - General (Nurse Practitioner) Debbrah Alar, MD (Dermatology) Scot Jun, MD (Inactive) (Gastroenterology) Lemar Livings Merrily Pew, MD (General Surgery) Earna Coder, MD as Consulting Physician (Oncology) Salena Saner, MD as Consulting Physician (Pulmonary Disease)  CHIEF COMPLAINTS/PURPOSE OF CONSULTATION:  CLL  #  Oncology History Overview Note  Joline Encalada is a 83 y.o. female with chronic lymphocytic leukemia (CLL).  WBC has ranged between 22,000 - 101,7000 since 05/2011.   She has received Rituxan x 2 four week cycles (06/27/2015 and 05/27/2017).  Hepatitis B surface antigen and hepatitis B surface antibody were negative on 07/04/2015.   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  CCND1, ATM, chromosome 12, and TP53 were normal.     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  There was a CD5 and CD23 positive monoclonal B cell population with lambda light chain restriction, negative for FMC7 and CD38, representing  88% of leukocytes, >5,000/uL. There was no loss of, or aberrant expression of, the pan T cell  antigens to  suggest a neoplastic T cell process. CD4:CD8 ratio 2.0  No circulating blasts were detected. There was no immunophenotypic evidence of abnormal myeloid maturation.  IGH/BCL2 by FISH is pending. --------------------------------------------------------  She began ibrutinib on 04/24/2019 (discontinued on 05/30/2019).                     She developed blood-streaked sputum (slight) then significant hematuria.                     She declined further ibrutinib. ---------------------------------------------------------------------------------            She began acalabrutinib on 03/14/2020 (held on 05/13/2020 secondary  to diarrhea). --------------------------------------------------------------------------------------   She has a 35 pack year smoking history.  She is in the low dose chest CT program.  Low dose chest CT on 02/02/2018 revealed a new endobronchial lesion in the segmental bronchus to the medial segment of the right middle lobe. This may simply reflect an area of retained secretions, however, the possibility of an endobronchial neoplasm should be considered. Lung-RADS 4AS, suspicious.  Low dose chest CT on 07/05/2018 revealed Lung-RADS 2, benign appearance or behavior.  Prior right middle lobe endobronchial lesion/mucous plugging was no longer visualized.  New mucous plugging in the right lower lobe.  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  Accompanied by her sister, linda  Harmoney Sienkiewicz 83 y.o.  female CLL-13 q. Deletion currently on Brukinsa/Zanubrutinib [started in sep 2024 ]; COPD- SCC of skin-is here for follow-up.  Patient states Brukinsa is giving her diarrhea. Having 3-4 Bms per day. Has taken 2 anti-diarrhea pills this morning.   Ankles are swelling.  Patient has "rash" on the lower extremities.  Otherwise she admits to compliance with her zanu pill.  Compliance with her iron pill.  Continues to chronic fatigue.  Otherwise denies any blood in stools or black-colored stools.   Review of Systems  Constitutional:  Positive for malaise/fatigue. Negative for chills, diaphoresis, fever and weight loss.  HENT:  Negative  for nosebleeds and sore throat.   Eyes:  Negative for double vision.  Respiratory:  Positive for hemoptysis and shortness of breath. Negative for cough, sputum production and  wheezing.   Cardiovascular:  Negative for chest pain, palpitations, orthopnea and leg swelling.  Gastrointestinal:  Negative for abdominal pain, blood in stool, constipation, diarrhea, heartburn, melena, nausea and vomiting.  Genitourinary:  Negative for dysuria, frequency and urgency.  Musculoskeletal:  Positive for back pain. Negative for joint pain.  Skin: Negative.  Negative for itching and rash.  Neurological:  Negative for dizziness, tingling, focal weakness, weakness and headaches.  Endo/Heme/Allergies:  Does not bruise/bleed easily.  Psychiatric/Behavioral:  Negative for depression. The patient is not nervous/anxious and does not have insomnia.      MEDICAL HISTORY:  Past Medical History:  Diagnosis Date   Allergy    Anxiety    Depression    GERD (gastroesophageal reflux disease)    Hemorrhoids    Hypertension    Leukemia, lymphoid (HCC)    CLL   Lobar pneumonia (HCC)    Osteopenia    Personal history of chemotherapy     SURGICAL HISTORY: Past Surgical History:  Procedure Laterality Date   ABDOMINAL HYSTERECTOMY     APPENDECTOMY     BREAST BIOPSY Left    bx x 3-neg   COLON SURGERY     sigmoid resection   COLONOSCOPY     2007, 2012   COLONOSCOPY WITH PROPOFOL N/A 09/17/2015   Procedure: COLONOSCOPY WITH PROPOFOL;  Surgeon: Earline Mayotte, MD;  Location: [facility] ENDOSCOPY;  Service: Endoscopy;  Laterality: N/A;   OOPHORECTOMY     SPINE SURGERY     L4-5    SOCIAL HISTORY: Social History   Socioeconomic History   Marital status: Married    Spouse name: Not on file   Number of children: Not on file   Years of education: 12   Highest education level: 12th grade  Occupational History   Not on file  Tobacco Use   Smoking status: Former    Current packs/day: 0.00    Average packs/day: 1 pack/day for 35.0 years (35.0 ttl pk-yrs)    Types: Cigarettes    Start date: 02/22/1986    Quit date: 02/22/2021    Years since quitting: 2.1   Smokeless tobacco: Never   Vaping Use   Vaping status: Never Used  Substance and Sexual Activity   Alcohol use: No    Alcohol/week: 0.0 standard drinks of alcohol   Drug use: No   Sexual activity: Not on file  Other Topics Concern   Not on file  Social History Narrative   Not on file   Social Drivers of Health   Financial Resource Strain: Low Risk  (06/10/2022)   Overall Financial Resource Strain (CARDIA)    Difficulty of Paying Living Expenses: Not hard at all  Food Insecurity: No Food Insecurity (11/20/2022)   Hunger Vital Sign    Worried About Running Out of Food in the Last Year: Never true    Ran Out of Food in the Last Year: Never true  Transportation Needs: No Transportation Needs (11/20/2022)   PRAPARE - Administrator, Civil Service (Medical): No    Lack of Transportation (Non-Medical): No  Physical Activity: Insufficiently Active (06/10/2022)   Exercise Vital Sign    Days of Exercise per Week: 2 days    Minutes of Exercise per Session: 20 min  Stress: No Stress Concern Present (06/10/2022)   [employer]  of Occupational Health - Occupational Stress Questionnaire    Feeling of Stress : Only a little  Social Connections: Moderately Integrated (06/10/2022)   Social Connection and Isolation Panel [NHANES]    Frequency of Communication with Friends and Family: More than three times a week    Frequency of Social Gatherings with Friends and Family: More than three times a week    Attends Religious Services: More than 4 times per year    Active Member of [employer] or Organizations: No    Attends Banker Meetings: Never    Marital Status: Married  Catering manager Violence: Not At Risk (11/20/2022)   Humiliation, Afraid, Rape, and Kick questionnaire    Fear of Current or Ex-Partner: No    Emotionally Abused: No    Physically Abused: No    Sexually Abused: No    FAMILY HISTORY: Family History  Problem Relation Age of Onset   Osteoporosis Mother    Hypertension Father     Heart attack Father    Stroke Maternal Grandfather    Breast cancer Sister 65    ALLERGIES:  is allergic to augmentin [amoxicillin-pot clavulanate] and biaxin [clarithromycin].  MEDICATIONS:  Current Outpatient Medications  Medication Sig Dispense Refill   acetaminophen (TYLENOL) 325 MG tablet Take by mouth.     albuterol (VENTOLIN HFA) 108 (90 Base) MCG/ACT inhaler TAKE 2 PUFFS BY MOUTH EVERY 6 HOURS AS NEEDED FOR WHEEZE OR SHORTNESS OF BREATH 18 each 59   atorvastatin (LIPITOR) 10 MG tablet TAKE 1 TABLET BY MOUTH EVERY DAY 90 tablet 3   benazepril (LOTENSIN) 40 MG tablet Take 1 tablet (40 mg total) by mouth daily. 90 tablet 4   BREZTRI AEROSPHERE 160-9-4.8 MCG/ACT AERO INHALE 2 PUFFS INTO THE LUNGS IN THE MORNING AND AT BEDTIME. 10.7 each 11   cholecalciferol (VITAMIN D3) 25 MCG (1000 UNIT) tablet Take 1,000 Units by mouth daily.     Cyanocobalamin (VITAMIN B-12) 5000 MCG TBDP Take 1 tablet by mouth daily at 2 PM.     diphenoxylate-atropine (LOMOTIL) 2.5-0.025 MG tablet Take 1 tablet by mouth 4 (four) times daily as needed for diarrhea or loose stools. Take it along with immodium 60 tablet 1   hydrocortisone (ANUSOL-HC) 25 MG suppository Short term use as needed.  Home med.     mometasone (NASONEX) 50 MCG/ACT nasal spray USE 1 SPRAY TO EACH NOSTRIL TWICE A DAY 51 each 1   Multiple Vitamins-Minerals (MULTIVITAMIN WITH MINERALS) tablet Take 1 tablet by mouth daily.     PARoxetine (PAXIL) 30 MG tablet Take 1 tablet (30 mg total) by mouth daily. 90 tablet 4   zanubrutinib (BRUKINSA) 80 MG capsule Take 1 capsule (80 mg total) by mouth daily. 30 capsule 1   No current facility-administered medications for this visit.      Marland Kitchen  PHYSICAL EXAMINATION: ECOG PERFORMANCE STATUS: 1 - Symptomatic but completely ambulatory  Vitals:   04/06/23 1019  BP: 131/63  Pulse: 80  Temp: 97.8 F (36.6 C)  SpO2: 96%     Filed Weights   04/06/23 1019  Weight: 141 lb (64 kg)    Spleen-up to  umbilicus.  Physical Exam HENT:     Head: Normocephalic and atraumatic.     Mouth/Throat:     Pharynx: No oropharyngeal exudate.  Eyes:     Pupils: Pupils are equal, round, and reactive to light.  Cardiovascular:     Rate and Rhythm: Normal rate and regular rhythm.  Pulmonary:  Effort: No respiratory distress.     Breath sounds: No wheezing.     Comments: Decreased air entry bilaterally at the bases. Abdominal:     General: Bowel sounds are normal. There is no distension.     Palpations: Abdomen is soft. There is no mass.     Tenderness: There is no abdominal tenderness. There is no guarding or rebound.  Musculoskeletal:        General: No tenderness. Normal range of motion.     Cervical back: Normal range of motion and neck supple.  Skin:    General: Skin is warm.  Neurological:     Mental Status: She is alert and oriented to person, place, and time.  Psychiatric:        Mood and Affect: Affect normal.      LABORATORY DATA:  I have reviewed the data as listed Lab Results  Component Value Date   WBC 178.9 (HH) 04/06/2023   HGB 11.6 (L) 04/06/2023   HCT 38.8 04/06/2023   MCV 104.0 (H) 04/06/2023   PLT 106 (L) 04/06/2023   Recent Labs    12/01/22 1008 12/10/22 1032 02/03/23 1005 04/06/23 1009  NA 139 142 141 138  K 4.2 4.0 4.2 4.0  CL 106 105 107 104  CO2 27 24 26 24   GLUCOSE 111* 87 101* 98  BUN 14 13 16 15   CREATININE 0.90 1.07* 0.91 0.90  CALCIUM 9.3 9.4 9.0 9.3  GFRNONAA >60  --  >60 >60  PROT 6.2* 6.4 6.1* 6.2*  ALBUMIN 4.3 4.8* 4.6 4.4  AST 20 18 18 18   ALT 13 11 10 13   ALKPHOS 110 131* 78 70  BILITOT 0.9 0.7 0.8 1.0    RADIOGRAPHIC STUDIES: I have personally reviewed the radiological images as listed and agreed with the findings in the report. No results found.  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 Deletion-poor tolerance to Ibrutinib-hematuria; ; and Acalburtinib [stopped in March  2022 because of diarrhea]. # AUG 21st, 2024- CT chest [Dr.Gonzalez]- Partially visualized splenomegaly, increased-again suggestive of progressive CLL.  Spleen up to umbilicus. Currently on Zanubrutinib 80 mg In AM [for last 3 weeks- SEP mid 2024].   # Patient tolerating Zanu 80 mg a day fairly well-monitor closely in the context of recent GI bleed see below.  White count 287-improving thrombocytopenia improved.  See below regarding anemia  # Anemia multifactorial CLL/GI bleed.  No hemolysis. Continue  gentle iron [iron biglycinate; 28 mg ] 1 pill a day-today hemoglobin is 11  improved from a baseline of 8.  # Bil LE swelling: sec to Zanubrutinib- G-1-2; recommend Stocking/ leg elevation.   # Diarrhea grade 1 secondary to zabu-recommend in addition to Imodium recommend Lomotil.  Prescription sent.  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   DEC 2023- immunoglobulins- IgG- 283. Patient declines given concerns of diarrhea./Poor tolerance.  #COPD/history of pneumonia -bilateral Jan 2023 [QUITsmoking-April, 2023; [facility]]-right middle lobe lung atelectasis [Dr.Gonzalez] - AUG 2024- CT- Improved aeration of the right middle lobe with residual mild plate like scarring versus atelectasis in the right middle lobe. table.   # Easy brusing sec to zanurbitinib-stable.   # Vaccination:s/p flu shot; ok with  covid for now.   # DISPOSITION:  # Follow up in 2 months- MD: Labs- cbc/cmp; LDH;quantitive immunoglobulin- Dr.B  All questions were answered. The patient knows to call the clinic with any problems, questions or concerns.   Earna Coder, MD 04/06/2023 11:40 AM

## 2023-04-07 ENCOUNTER — Telehealth: Payer: Self-pay | Admitting: *Deleted

## 2023-04-07 ENCOUNTER — Other Ambulatory Visit (HOSPITAL_COMMUNITY): Payer: Self-pay

## 2023-04-07 ENCOUNTER — Other Ambulatory Visit: Payer: Self-pay | Admitting: Internal Medicine

## 2023-04-07 ENCOUNTER — Other Ambulatory Visit: Payer: Self-pay

## 2023-04-07 DIAGNOSIS — C9110 Chronic lymphocytic leukemia of B-cell type not having achieved remission: Secondary | ICD-10-CM

## 2023-04-07 MED ORDER — BRUKINSA 80 MG PO CAPS
80.0000 mg | ORAL_CAPSULE | Freq: Every day | ORAL | 1 refills | Status: DC
Start: 2023-04-07 — End: 2023-05-26
  Filled 2023-04-07: qty 30, 30d supply, fill #0
  Filled 2023-05-02 (×2): qty 30, 30d supply, fill #1

## 2023-04-07 NOTE — Progress Notes (Signed)
Specialty Pharmacy Ongoing Clinical Assessment Note  Neeya Prigmore is a 83 y.o. female who is being followed by the specialty pharmacy service for RxSp Oncology   Patient's specialty medication(s) reviewed today: Zanubrutinib (Brukinsa)   Missed doses in the last 4 weeks: 0   Patient/Caregiver did not have any additional questions or concerns.   Therapeutic benefit summary: Patient is achieving benefit   Adverse events/side effects summary: Experienced adverse events/side effects (diarrhea, MD is aware and added on Lomotil yesterday; swollen ankles, MD is monitoring; and easy bruising, MD is also monitoring, all are tolerable at this time)   Patient's therapy is appropriate to: Continue    Goals Addressed             This Visit's Progress    Stabilization of disease       Patient is on track. Patient will maintain adherence.  Patient's white count is improving as of office visit on 04/06/23.          Follow up:  3 months  Servando Snare Specialty Pharmacist

## 2023-04-07 NOTE — Addendum Note (Signed)
Addended by: Darrold Span A on: 04/07/2023 04:36 PM   Modules accepted: Orders

## 2023-04-07 NOTE — Telephone Encounter (Signed)
The staff needs a refill for the med brukinsa today for them to sent it to her [facility]. According to the staff she called today for refill. It will go to [place]

## 2023-04-07 NOTE — Progress Notes (Signed)
Specialty Pharmacy Refill Coordination Note  Katherine [name] is a 83 y.o. female contacted today regarding refills of specialty medication(s) Zanubrutinib [name])   Patient requested Delivery   Delivery date: 04/11/23   Verified address: [address], [place], [place] [address]   Medication will be filled on 04/08/23. This fill date is pending response to refill request from provider. Patient is aware and if they have not received fill by intended date they must follow up with pharmacy.

## 2023-04-08 ENCOUNTER — Other Ambulatory Visit: Payer: Self-pay

## 2023-04-11 ENCOUNTER — Other Ambulatory Visit (HOSPITAL_COMMUNITY): Payer: Self-pay

## 2023-04-28 ENCOUNTER — Other Ambulatory Visit: Payer: Self-pay

## 2023-05-02 ENCOUNTER — Other Ambulatory Visit (HOSPITAL_COMMUNITY): Payer: Self-pay

## 2023-05-04 ENCOUNTER — Other Ambulatory Visit (HOSPITAL_COMMUNITY): Payer: Self-pay

## 2023-05-04 ENCOUNTER — Other Ambulatory Visit: Payer: Self-pay

## 2023-05-04 NOTE — Progress Notes (Signed)
 Specialty Pharmacy Refill Coordination Note  Katherine [name] is a 83 y.o. female contacted today regarding refills of specialty medication(s) Zanubrutinib [name])   Patient requested Delivery   Delivery date: 05/04/23   Verified address: [address]   [place] [address]   Medication will be filled on 05/03/23.

## 2023-05-24 ENCOUNTER — Other Ambulatory Visit (HOSPITAL_COMMUNITY): Payer: Self-pay

## 2023-05-26 ENCOUNTER — Other Ambulatory Visit: Payer: Self-pay

## 2023-05-26 ENCOUNTER — Other Ambulatory Visit: Payer: Self-pay | Admitting: Internal Medicine

## 2023-05-26 ENCOUNTER — Other Ambulatory Visit (HOSPITAL_COMMUNITY): Payer: Self-pay

## 2023-05-26 DIAGNOSIS — C9110 Chronic lymphocytic leukemia of B-cell type not having achieved remission: Secondary | ICD-10-CM

## 2023-05-26 NOTE — Progress Notes (Signed)
 Specialty Pharmacy Refill Coordination Note  Katherine [name] is a 83 y.o. female contacted today regarding refills of specialty medication(s) Zanubrutinib [name])   Patient requested Delivery   Delivery date: 05/31/23   Verified address: [address]   [place] [address]   Medication will be filled on 05/30/23.   This fill date is pending response to refill request from provider. Patient is aware and if they have not received fill by intended date, they must follow up with pharmacy.

## 2023-05-27 ENCOUNTER — Encounter: Payer: Self-pay | Admitting: Internal Medicine

## 2023-05-27 ENCOUNTER — Other Ambulatory Visit: Payer: Self-pay

## 2023-05-27 MED ORDER — BRUKINSA 80 MG PO CAPS
80.0000 mg | ORAL_CAPSULE | Freq: Every day | ORAL | 1 refills | Status: DC
  Filled 2023-05-27: qty 30, 30d supply, fill #0
  Filled 2023-06-23: qty 30, 30d supply, fill #1

## 2023-05-30 ENCOUNTER — Other Ambulatory Visit: Payer: Self-pay

## 2023-06-08 ENCOUNTER — Inpatient Hospital Stay: Payer: Medicare HMO | Admitting: Internal Medicine

## 2023-06-08 ENCOUNTER — Telehealth: Payer: Self-pay

## 2023-06-08 ENCOUNTER — Inpatient Hospital Stay: Payer: Medicare HMO | Attending: Internal Medicine

## 2023-06-08 ENCOUNTER — Encounter: Payer: Self-pay | Admitting: Internal Medicine

## 2023-06-08 VITALS — BP 125/60 | HR 74 | Temp 97.5°F | Resp 16 | Ht 66.0 in | Wt 144.8 lb

## 2023-06-08 DIAGNOSIS — Z8701 Personal history of pneumonia (recurrent): Secondary | ICD-10-CM | POA: Diagnosis not present

## 2023-06-08 DIAGNOSIS — J449 Chronic obstructive pulmonary disease, unspecified: Secondary | ICD-10-CM | POA: Diagnosis not present

## 2023-06-08 DIAGNOSIS — Z803 Family history of malignant neoplasm of breast: Secondary | ICD-10-CM | POA: Diagnosis not present

## 2023-06-08 DIAGNOSIS — K922 Gastrointestinal hemorrhage, unspecified: Secondary | ICD-10-CM | POA: Diagnosis not present

## 2023-06-08 DIAGNOSIS — Z79899 Other long term (current) drug therapy: Secondary | ICD-10-CM | POA: Insufficient documentation

## 2023-06-08 DIAGNOSIS — R197 Diarrhea, unspecified: Secondary | ICD-10-CM | POA: Diagnosis not present

## 2023-06-08 DIAGNOSIS — R252 Cramp and spasm: Secondary | ICD-10-CM | POA: Insufficient documentation

## 2023-06-08 DIAGNOSIS — M7989 Other specified soft tissue disorders: Secondary | ICD-10-CM | POA: Diagnosis not present

## 2023-06-08 DIAGNOSIS — D696 Thrombocytopenia, unspecified: Secondary | ICD-10-CM | POA: Diagnosis not present

## 2023-06-08 DIAGNOSIS — Z87891 Personal history of nicotine dependence: Secondary | ICD-10-CM | POA: Insufficient documentation

## 2023-06-08 DIAGNOSIS — D630 Anemia in neoplastic disease: Secondary | ICD-10-CM | POA: Insufficient documentation

## 2023-06-08 DIAGNOSIS — C9110 Chronic lymphocytic leukemia of B-cell type not having achieved remission: Secondary | ICD-10-CM

## 2023-06-08 DIAGNOSIS — G4700 Insomnia, unspecified: Secondary | ICD-10-CM | POA: Diagnosis not present

## 2023-06-08 DIAGNOSIS — Z85828 Personal history of other malignant neoplasm of skin: Secondary | ICD-10-CM | POA: Diagnosis not present

## 2023-06-08 LAB — CBC WITH DIFFERENTIAL (CANCER CENTER ONLY)
Abs Immature Granulocytes: 0.14 10*3/uL — ABNORMAL HIGH (ref 0.00–0.07)
Basophils Absolute: 0 10*3/uL (ref 0.0–0.1)
Basophils Relative: 0 %
Eosinophils Absolute: 0.1 10*3/uL (ref 0.0–0.5)
Eosinophils Relative: 0 %
HCT: 42.8 % (ref 36.0–46.0)
Hemoglobin: 13 g/dL (ref 12.0–15.0)
Immature Granulocytes: 0 %
Lymphocytes Relative: 98 %
Lymphs Abs: 122.7 10*3/uL — ABNORMAL HIGH (ref 0.7–4.0)
MCH: 31.1 pg (ref 26.0–34.0)
MCHC: 30.4 g/dL (ref 30.0–36.0)
MCV: 102.4 fL — ABNORMAL HIGH (ref 80.0–100.0)
Monocytes Absolute: 0.6 10*3/uL (ref 0.1–1.0)
Monocytes Relative: 1 %
Neutro Abs: 1.6 10*3/uL — ABNORMAL LOW (ref 1.7–7.7)
Neutrophils Relative %: 1 %
Platelet Count: 114 10*3/uL — ABNORMAL LOW (ref 150–400)
RBC: 4.18 MIL/uL (ref 3.87–5.11)
RDW: 13.9 % (ref 11.5–15.5)
Smear Review: NORMAL
WBC Count: 125.1 10*3/uL (ref 4.0–10.5)
nRBC: 0 % (ref 0.0–0.2)

## 2023-06-08 LAB — CMP (CANCER CENTER ONLY)
ALT: 13 U/L (ref 0–44)
AST: 19 U/L (ref 15–41)
Albumin: 4.7 g/dL (ref 3.5–5.0)
Alkaline Phosphatase: 76 U/L (ref 38–126)
Anion gap: 9 (ref 5–15)
BUN: 14 mg/dL (ref 8–23)
CO2: 27 mmol/L (ref 22–32)
Calcium: 9.6 mg/dL (ref 8.9–10.3)
Chloride: 103 mmol/L (ref 98–111)
Creatinine: 0.86 mg/dL (ref 0.44–1.00)
GFR, Estimated: >60 mL/min (ref >60)
Glucose, Bld: 93 mg/dL (ref 70–99)
Potassium: 4.1 mmol/L (ref 3.5–5.1)
Sodium: 139 mmol/L (ref 135–145)
Total Bilirubin: 1.4 mg/dL — ABNORMAL HIGH (ref 0.0–1.2)
Total Protein: 6.8 g/dL (ref 6.5–8.1)

## 2023-06-08 LAB — LACTATE DEHYDROGENASE: LDH: 127 U/L (ref 98–192)

## 2023-06-08 NOTE — Assessment & Plan Note (Addendum)
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 Deletion-poor tolerance to Ibrutinib-hematuria; ; and Acalburtinib [stopped in March 2022 because of diarrhea]. # AUG 21st, 2024- CT chest [Dr.Gonzalez]- Partially visualized splenomegaly, increased-again suggestive of progressive CLL.  Spleen up to umbilicus. Currently on Zanubrutinib 80 mg In AM [for last 3 weeks- SEP mid 2024].   # Patient tolerating Zanu 80 mg a day fairly well-monitor closely in the context of recent GI bleed see below.  White count 125-improving thrombocytopenia improved.   # Anemia multifactorial CLL/GI bleed.  No hemolysis. Continue  gentle iron [iron biglycinate; 28 mg ] 1 pill a day-today hemoglobin is 13 improved from a baseline of 8. Stable.   # Bil Leg cramps/insomnia- Magnesium L-threonate is often used to improve sleep quality  # Bil LE swelling: sec to Zanubrutinib- G-1-2;continue Stocking/ leg elevation-  Stable.  # Diarrhea grade 1 secondary to zabu continue in addition to Imodium recommend Lomotil. 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   immunoglobulins- IgG-  363 Patient declines given concerns of diarrhea./Poor tolerance. Stable  #COPD/history of pneumonia -bilateral Jan 2023 [QUITsmoking-April, 2023; [facility]]-right middle lobe lung atelectasis [Dr.Gonzalez] - AUG 2024- CT- Improved aeration of the right middle lobe with residual mild plate like scarring versus atelectasis in the right middle lobe. Stable  # Easy brusing sec to zanurbitinib-stable.   # Vaccination:s/p flu shot; ok with  covid for now. Recommend shingles vaccination [remote]   # DISPOSITION:  # Follow up in 2 months- MD: Labs- cbc/cmp; LDH;quantitive immunoglobulin- Dr.B

## 2023-06-08 NOTE — Telephone Encounter (Signed)
 Received critical lab from [place] at [facility].   White count 125.1. Read back to [facility]    Dr. B notified.

## 2023-06-08 NOTE — Progress Notes (Signed)
 [facility] CONSULT NOTE  Patient Care Team: Katherine Skiff, NP as PCP - General (Nurse Practitioner) Katherine Alar, MD (Dermatology) Katherine Jun, MD (Inactive) (Gastroenterology) Katherine Livings Merrily Pew, MD (General Surgery) Katherine Coder, MD as Consulting Physician (Oncology) Katherine Saner, MD as Consulting Physician (Pulmonary Disease)  CHIEF COMPLAINTS/PURPOSE OF CONSULTATION:  CLL  #  Oncology History Overview Note  Katherine [name] is a 83 y.o. female with chronic lymphocytic leukemia (CLL).  WBC has ranged between 22,000 - 101,7000 since 05/2011.   She has received Rituxan x 2 four week cycles (06/27/2015 and 05/27/2017).  Hepatitis B surface antigen and hepatitis B surface antibody were negative on 07/04/2015.   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  CCND1, ATM, chromosome 12, and TP53 were normal.     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  There was a CD5 and CD23 positive monoclonal B cell population with lambda light chain restriction, negative for FMC7 and CD38, representing  88% of leukocytes, >5,000/uL. There was no loss of, or aberrant expression of, the pan T cell  antigens to  suggest a neoplastic T cell process. CD4:CD8 ratio 2.0  No circulating blasts were detected. There was no immunophenotypic evidence of abnormal myeloid maturation.  IGH/BCL2 by FISH is pending. --------------------------------------------------------  She began ibrutinib on 04/24/2019 (discontinued on 05/30/2019).                     She developed blood-streaked sputum (slight) then significant hematuria.                     She declined further ibrutinib. ---------------------------------------------------------------------------------            She began acalabrutinib on 03/14/2020 (held on 05/13/2020 secondary  to diarrhea). --------------------------------------------------------------------------------------   She has a 35 pack year smoking history.  She is in the low dose chest CT program.  Low dose chest CT on 02/02/2018 revealed a new endobronchial lesion in the segmental bronchus to the medial segment of the right middle lobe. This may simply reflect an area of retained secretions, however, the possibility of an endobronchial neoplasm should be considered. Lung-RADS 4AS, suspicious.  Low dose chest CT on 07/05/2018 revealed Lung-RADS 2, benign appearance or behavior.  Prior right middle lobe endobronchial lesion/mucous plugging was no longer visualized.  New mucous plugging in the right lower lobe.  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  Accompanied by her  daughter.   Katherine [name] 83 y.o.  female CLL-13 q. Deletion currently on Brukinsa/Zanubrutinib [started in sep 2024 ]; COPD- SCC of skin-is here for follow-up.  Pt c/o cramps in her legs, comes and goes. She is having to be careful standing or she will falls.  Ankles are swelling.     Otherwise she admits to compliance with her zanu pill.  Compliance with her iron pill.  Continues to chronic fatigue.  Otherwise denies any blood in stools or black-colored stools.   Review of Systems  Constitutional:  Positive for malaise/fatigue. Negative for chills, diaphoresis, fever and weight loss.  HENT:  Negative for nosebleeds and sore  throat.   Eyes:  Negative for double vision.  Respiratory:  Positive for hemoptysis and shortness of breath. Negative for cough, sputum production and wheezing.   Cardiovascular:  Negative for chest pain, palpitations,  orthopnea and leg swelling.  Gastrointestinal:  Negative for abdominal pain, blood in stool, constipation, diarrhea, heartburn, melena, nausea and vomiting.  Genitourinary:  Negative for dysuria, frequency and urgency.  Musculoskeletal:  Positive for back pain. Negative for joint pain.  Skin: Negative.  Negative for itching and rash.  Neurological:  Negative for dizziness, tingling, focal weakness, weakness and headaches.  Endo/Heme/Allergies:  Does not bruise/bleed easily.  Psychiatric/Behavioral:  Negative for depression. The patient is not nervous/anxious and does not have insomnia.      MEDICAL HISTORY:  Past Medical History:  Diagnosis Date   Allergy    Anxiety    Depression    GERD (gastroesophageal reflux disease)    Hemorrhoids    Hypertension    Leukemia, lymphoid (HCC)    CLL   Lobar pneumonia (HCC)    Osteopenia    Personal history of chemotherapy     SURGICAL HISTORY: Past Surgical History:  Procedure Laterality Date   ABDOMINAL HYSTERECTOMY     APPENDECTOMY     BREAST BIOPSY Left    bx x 3-neg   COLON SURGERY     sigmoid resection   COLONOSCOPY     2007, 2012   COLONOSCOPY WITH PROPOFOL N/A 09/17/2015   Procedure: COLONOSCOPY WITH PROPOFOL;  Surgeon: Marshall Skeeter, MD;  Location: [facility] ENDOSCOPY;  Service: Endoscopy;  Laterality: N/A;   OOPHORECTOMY     SPINE SURGERY     L4-5    SOCIAL HISTORY: Social History   Socioeconomic History   Marital status: Married    Spouse name: Not on file   Number of children: Not on file   Years of education: 12   Highest education level: 12th grade  Occupational History   Not on file  Tobacco Use   Smoking status: Former    Current packs/day: 0.00    Average packs/day: 1 pack/day for 35.0 years (35.0 ttl pk-yrs)    Types: Cigarettes    Start date: 02/22/1986    Quit date: 02/22/2021    Years since quitting: 2.2   Smokeless tobacco: Never  Vaping Use   Vaping status: Never Used  Substance and Sexual Activity    Alcohol use: No    Alcohol/week: 0.0 standard drinks of alcohol   Drug use: No   Sexual activity: Not on file  Other Topics Concern   Not on file  Social History Narrative   Not on file   Social Drivers of Health   Financial Resource Strain: Low Risk  (06/10/2022)   Overall Financial Resource Strain (CARDIA)    Difficulty of Paying Living Expenses: Not hard at all  Food Insecurity: No Food Insecurity (11/20/2022)   Hunger Vital Sign    Worried About Running Out of Food in the Last Year: Never true    Ran Out of Food in the Last Year: Never true  Transportation Needs: No Transportation Needs (11/20/2022)   PRAPARE - Administrator, Civil Service (Medical): No    Lack of Transportation (Non-Medical): No  Physical Activity: Insufficiently Active (06/10/2022)   Exercise Vital Sign    Days of Exercise per Week: 2 days    Minutes of Exercise per Session: 20 min  Stress: No Stress Concern Present (06/10/2022)   [employer] of Occupational Health -  Occupational Stress Questionnaire    Feeling of Stress : Only a little  Social Connections: Moderately Integrated (06/10/2022)   Social Connection and Isolation Panel [NHANES]    Frequency of Communication with Friends and Family: More than three times a week    Frequency of Social Gatherings with Friends and Family: More than three times a week    Attends Religious Services: More than 4 times per year    Active Member of [employer] or Organizations: No    Attends Banker Meetings: Never    Marital Status: Married  Catering manager Violence: Not At Risk (11/20/2022)   Humiliation, Afraid, Rape, and Kick questionnaire    Fear of Current or Ex-Partner: No    Emotionally Abused: No    Physically Abused: No    Sexually Abused: No    FAMILY HISTORY: Family History  Problem Relation Age of Onset   Osteoporosis Mother    Hypertension Father    Heart attack Father    Stroke Maternal Grandfather    Breast cancer  Sister 64    ALLERGIES:  is allergic to augmentin [amoxicillin-pot clavulanate] and biaxin [clarithromycin].  MEDICATIONS:  Current Outpatient Medications  Medication Sig Dispense Refill   acetaminophen (TYLENOL) 325 MG tablet Take by mouth.     albuterol (VENTOLIN HFA) 108 (90 Base) MCG/ACT inhaler TAKE 2 PUFFS BY MOUTH EVERY 6 HOURS AS NEEDED FOR WHEEZE OR SHORTNESS OF BREATH 18 each 59   atorvastatin (LIPITOR) 10 MG tablet TAKE 1 TABLET BY MOUTH EVERY DAY 90 tablet 3   benazepril (LOTENSIN) 40 MG tablet Take 1 tablet (40 mg total) by mouth daily. 90 tablet 4   BREZTRI AEROSPHERE 160-9-4.8 MCG/ACT AERO INHALE 2 PUFFS INTO THE LUNGS IN THE MORNING AND AT BEDTIME. 10.7 each 11   cholecalciferol (VITAMIN D3) 25 MCG (1000 UNIT) tablet Take 1,000 Units by mouth daily.     Cyanocobalamin (VITAMIN B-12) 5000 MCG TBDP Take 1 tablet by mouth daily at 2 PM.     diphenoxylate-atropine (LOMOTIL) 2.5-0.025 MG tablet Take 1 tablet by mouth 4 (four) times daily as needed for diarrhea or loose stools. Take it along with immodium 60 tablet 1   hydrocortisone (ANUSOL-HC) 25 MG suppository Short term use as needed.  Home med.     mometasone (NASONEX) 50 MCG/ACT nasal spray USE 1 SPRAY TO EACH NOSTRIL TWICE A DAY 51 each 1   Multiple Vitamins-Minerals (MULTIVITAMIN WITH MINERALS) tablet Take 1 tablet by mouth daily.     PARoxetine (PAXIL) 30 MG tablet Take 1 tablet (30 mg total) by mouth daily. 90 tablet 4   zanubrutinib (BRUKINSA) 80 MG capsule Take 1 capsule (80 mg total) by mouth daily. 30 capsule 1   No current facility-administered medications for this visit.      Marland Kitchen  PHYSICAL EXAMINATION: ECOG PERFORMANCE STATUS: 1 - Symptomatic but completely ambulatory  Vitals:   06/08/23 1016  BP: 125/60  Pulse: 74  Resp: 16  Temp: (!) 97.5 F (36.4 C)  SpO2: 95%     Filed Weights   06/08/23 1016  Weight: 144 lb 12.8 oz (65.7 kg)    Spleen-up to umbilicus.  Physical Exam HENT:     Head:  Normocephalic and atraumatic.     Mouth/Throat:     Pharynx: No oropharyngeal exudate.  Eyes:     Pupils: Pupils are equal, round, and reactive to light.  Cardiovascular:     Rate and Rhythm: Normal rate and regular rhythm.  Pulmonary:  Effort: No respiratory distress.     Breath sounds: No wheezing.     Comments: Decreased air entry bilaterally at the bases. Abdominal:     General: Bowel sounds are normal. There is no distension.     Palpations: Abdomen is soft. There is no mass.     Tenderness: There is no abdominal tenderness. There is no guarding or rebound.  Musculoskeletal:        General: No tenderness. Normal range of motion.     Cervical back: Normal range of motion and neck supple.  Skin:    General: Skin is warm.  Neurological:     Mental Status: She is alert and oriented to person, place, and time.  Psychiatric:        Mood and Affect: Affect normal.      LABORATORY DATA:  I have reviewed the data as listed Lab Results  Component Value Date   WBC 125.1 (HH) 06/08/2023   HGB 13.0 06/08/2023   HCT 42.8 06/08/2023   MCV 102.4 (H) 06/08/2023   PLT 114 (L) 06/08/2023   Recent Labs    02/03/23 1005 04/06/23 1009 06/08/23 1007  NA 141 138 139  K 4.2 4.0 4.1  CL 107 104 103  CO2 26 24 27   GLUCOSE 101* 98 93  BUN 16 15 14   CREATININE 0.91 0.90 0.86  CALCIUM 9.0 9.3 9.6  GFRNONAA >60 >60 >60  PROT 6.1* 6.2* 6.8  ALBUMIN 4.6 4.4 4.7  AST 18 18 19   ALT 10 13 13   ALKPHOS 78 70 76  BILITOT 0.8 1.0 1.4*    RADIOGRAPHIC STUDIES: I have personally reviewed the radiological images as listed and agreed with the findings in the report. No results found.  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 Deletion-poor tolerance to Ibrutinib-hematuria; ; and Acalburtinib [stopped in March 2022 because of diarrhea]. # AUG 21st, 2024- CT chest [Dr.Gonzalez]- Partially visualized splenomegaly, increased-again  suggestive of progressive CLL.  Spleen up to umbilicus. Currently on Zanubrutinib 80 mg In AM [for last 3 weeks- SEP mid 2024].   # Patient tolerating Zanu 80 mg a day fairly well-monitor closely in the context of recent GI bleed see below.  White count 125-improving thrombocytopenia improved.   # Anemia multifactorial CLL/GI bleed.  No hemolysis. Continue  gentle iron [iron biglycinate; 28 mg ] 1 pill a day-today hemoglobin is 13 improved from a baseline of 8. Stable.   # Bil Leg cramps/insomnia- Magnesium L-threonate is often used to improve sleep quality  # Bil LE swelling: sec to Zanubrutinib- G-1-2;continue Stocking/ leg elevation-  Stable.  # Diarrhea grade 1 secondary to zabu continue in addition to Imodium recommend Lomotil. 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   immunoglobulins- IgG-  363 Patient declines given concerns of diarrhea./Poor tolerance. Stable  #COPD/history of pneumonia -bilateral Jan 2023 [QUITsmoking-April, 2023; [facility]]-right middle lobe lung atelectasis [Dr.Gonzalez] - AUG 2024- CT- Improved aeration of the right middle lobe with residual mild plate like scarring versus atelectasis in the right middle lobe. Stable  # Easy brusing sec to zanurbitinib-stable.   # Vaccination:s/p flu shot; ok with  covid for now. Recommend shingles vaccination [remote]   # DISPOSITION:  # Follow up in 2 months- MD: Labs- cbc/cmp; LDH;quantitive immunoglobulin- Dr.B       All questions were answered. The patient knows  to call the clinic with any problems, questions or concerns.   Gwyn Leos, MD 06/08/2023 12:21 PM

## 2023-06-08 NOTE — Progress Notes (Signed)
 C/o cramps in her legs, comes and goes. She is having to be careful standing or she will fall.

## 2023-06-10 ENCOUNTER — Encounter: Payer: Self-pay | Admitting: Internal Medicine

## 2023-06-11 DIAGNOSIS — R7309 Other abnormal glucose: Secondary | ICD-10-CM | POA: Insufficient documentation

## 2023-06-11 NOTE — Patient Instructions (Signed)
 Be Involved in Caring For Your Health:  Taking Medications When medications are taken as directed, they can greatly improve your health. But if they are not taken as prescribed, they may not work. In some cases, not taking them correctly can be harmful. To help ensure your treatment remains effective and safe, understand your medications and how to take them. Bring your medications to each visit for review by your provider.  Your lab results, notes, and after visit summary will be available on My Chart. We strongly encourage you to use this feature. If lab results are abnormal the clinic will contact you with the appropriate steps. If the clinic does not contact you assume the results are satisfactory. You can always view your results on My Chart. If you have questions regarding your health or results, please contact the clinic during office hours. You can also ask questions on My Chart.  We at [facility] are grateful that you chose [place] to provide your care. We strive to provide evidence-based and compassionate care and are always looking for feedback. If you get a survey from the clinic please complete this so we can hear your opinions.  Heart-Healthy Eating Plan Many factors influence your heart health, including eating and exercise habits. Heart health is also called coronary health. Coronary risk increases with abnormal blood fat (lipid) levels. A heart-healthy eating plan includes limiting unhealthy fats, increasing healthy fats, limiting salt (sodium) intake, and making other diet and lifestyle changes. What is my plan? Your health care provider may recommend that: You limit your fat intake to _________% or less of your total calories each day. You limit your saturated fat intake to _________% or less of your total calories each day. You limit the amount of cholesterol in your diet to less than _________ mg per day. You limit the amount of sodium in your diet to less than _________  mg per day. What are tips for following this plan? Cooking Cook foods using methods other than frying. Baking, boiling, grilling, and broiling are all good options. Other ways to reduce fat include: Removing the skin from poultry. Removing all visible fats from meats. Steaming vegetables in water or broth. Meal planning  At meals, imagine dividing your plate into fourths: Fill one-half of your plate with vegetables and green salads. Fill one-fourth of your plate with whole grains. Fill one-fourth of your plate with lean protein foods. Eat 2-4 cups of vegetables per day. One cup of vegetables equals 1 cup (91 g) broccoli or cauliflower florets, 2 medium carrots, 1 large bell pepper, 1 large sweet potato, 1 large tomato, 1 medium white potato, 2 cups (150 g) raw leafy greens. Eat 1-2 cups of fruit per day. One cup of fruit equals 1 small apple, 1 large banana, 1 cup (237 g) mixed fruit, 1 large orange,  cup (82 g) dried fruit, 1 cup (240 mL) 100% fruit juice. Eat more foods that contain soluble fiber. Examples include apples, broccoli, carrots, beans, peas, and barley. Aim to get 25-30 g of fiber per day. Increase your consumption of legumes, nuts, and seeds to 4-5 servings per week. One serving of dried beans or legumes equals  cup (90 g) cooked, 1 serving of nuts is  oz (12 almonds, 24 pistachios, or 7 walnut halves), and 1 serving of seeds equals  oz (8 g). Fats Choose healthy fats more often. Choose monounsaturated and polyunsaturated fats, such as olive and canola oils, avocado oil, flaxseeds, walnuts, almonds, and seeds. Eat  more omega-3 fats. Choose salmon, mackerel, sardines, tuna, flaxseed oil, and ground flaxseeds. Aim to eat fish at least 2 times each week. Check food labels carefully to identify foods with trans fats or high amounts of saturated fat. Limit saturated fats. These are found in animal products, such as meats, butter, and cream. Plant sources of saturated fats  include palm oil, palm kernel oil, and coconut oil. Avoid foods with partially hydrogenated oils in them. These contain trans fats. Examples are stick margarine, some tub margarines, cookies, crackers, and other baked goods. Avoid fried foods. General information Eat more home-cooked food and less restaurant, buffet, and fast food. Limit or avoid alcohol. Limit foods that are high in added sugar and simple starches such as foods made using white refined flour (white breads, pastries, sweets). Lose weight if you are overweight. Losing just 5-10% of your body weight can help your overall health and prevent diseases such as diabetes and heart disease. Monitor your sodium intake, especially if you have high blood pressure. Talk with your health care provider about your sodium intake. Try to incorporate more vegetarian meals weekly. What foods should I eat? Fruits All fresh, canned (in natural juice), or frozen fruits. Vegetables Fresh or frozen vegetables (raw, steamed, roasted, or grilled). Green salads. Grains Most grains. Choose whole wheat and whole grains most of the time. Rice and pasta, including brown rice and pastas made with whole wheat. Meats and other proteins Lean, well-trimmed beef, veal, pork, and lamb. Chicken and [place] without skin. All fish and shellfish. Wild duck, rabbit, pheasant, and venison. Egg whites or low-cholesterol egg substitutes. Dried beans, peas, lentils, and tofu. Seeds and most nuts. Dairy Low-fat or nonfat cheeses, including ricotta and mozzarella. Skim or 1% milk (liquid, powdered, or evaporated). Buttermilk made with low-fat milk. Nonfat or low-fat yogurt. Fats and oils Non-hydrogenated (trans-free) margarines. Vegetable oils, including soybean, sesame, sunflower, olive, avocado, peanut, safflower, corn, canola, and cottonseed. Salad dressings or mayonnaise made with a vegetable oil. Beverages Water (mineral or sparkling). Coffee and tea. Unsweetened ice  tea. Diet beverages. Sweets and desserts Sherbet, gelatin, and fruit ice. Small amounts of dark chocolate. Limit all sweets and desserts. Seasonings and condiments All seasonings and condiments. The items listed above may not be a complete list of foods and beverages you can eat. Contact a dietitian for more options. What foods should I avoid? Fruits Canned fruit in heavy syrup. Fruit in cream or butter sauce. Fried fruit. Limit coconut. Vegetables Vegetables cooked in cheese, cream, or butter sauce. Fried vegetables. Grains Breads made with saturated or trans fats, oils, or whole milk. Croissants. Sweet rolls. Donuts. High-fat crackers, such as cheese crackers and chips. Meats and other proteins Fatty meats, such as hot dogs, ribs, sausage, bacon, rib-eye roast or steak. High-fat deli meats, such as salami and bologna. Caviar. Domestic duck and goose. Organ meats, such as liver. Dairy Cream, sour cream, cream cheese, and creamed cottage cheese. Whole-milk cheeses. Whole or 2% milk (liquid, evaporated, or condensed). Whole buttermilk. Cream sauce or high-fat cheese sauce. Whole-milk yogurt. Fats and oils Meat fat, or shortening. Cocoa butter, hydrogenated oils, palm oil, coconut oil, palm kernel oil. Solid fats and shortenings, including bacon fat, salt pork, lard, and butter. Nondairy cream substitutes. Salad dressings with cheese or sour cream. Beverages Regular sodas and any drinks with added sugar. Sweets and desserts Frosting. Pudding. Cookies. Cakes. Pies. Milk chocolate or white chocolate. Buttered syrups. Full-fat ice cream or ice cream drinks. The items listed above may  not be a complete list of foods and beverages to avoid. Contact a dietitian for more information. Summary Heart-healthy meal planning includes limiting unhealthy fats, increasing healthy fats, limiting salt (sodium) intake and making other diet and lifestyle changes. Lose weight if you are overweight. Losing just  5-10% of your body weight can help your overall health and prevent diseases such as diabetes and heart disease. Focus on eating a balance of foods, including fruits and vegetables, low-fat or nonfat dairy, lean protein, nuts and legumes, whole grains, and heart-healthy oils and fats. This information is not intended to replace advice given to you by your health care provider. Make sure you discuss any questions you have with your health care provider. Document Revised: 03/16/2021 Document Reviewed: 03/16/2021 Elsevier Patient Education  2024 [employer].

## 2023-06-13 ENCOUNTER — Encounter: Payer: Self-pay | Admitting: Nurse Practitioner

## 2023-06-13 ENCOUNTER — Ambulatory Visit (INDEPENDENT_AMBULATORY_CARE_PROVIDER_SITE_OTHER): Payer: Self-pay | Admitting: Nurse Practitioner

## 2023-06-13 VITALS — BP 108/63 | HR 80 | Temp 98.7°F | Wt 144.8 lb

## 2023-06-13 DIAGNOSIS — E7800 Pure hypercholesterolemia, unspecified: Secondary | ICD-10-CM | POA: Diagnosis not present

## 2023-06-13 DIAGNOSIS — F339 Major depressive disorder, recurrent, unspecified: Secondary | ICD-10-CM | POA: Diagnosis not present

## 2023-06-13 DIAGNOSIS — I700 Atherosclerosis of aorta: Secondary | ICD-10-CM | POA: Diagnosis not present

## 2023-06-13 DIAGNOSIS — R7309 Other abnormal glucose: Secondary | ICD-10-CM | POA: Diagnosis not present

## 2023-06-13 DIAGNOSIS — I10 Essential (primary) hypertension: Secondary | ICD-10-CM

## 2023-06-13 DIAGNOSIS — J432 Centrilobular emphysema: Secondary | ICD-10-CM

## 2023-06-13 DIAGNOSIS — F419 Anxiety disorder, unspecified: Secondary | ICD-10-CM

## 2023-06-13 DIAGNOSIS — D696 Thrombocytopenia, unspecified: Secondary | ICD-10-CM | POA: Diagnosis not present

## 2023-06-13 DIAGNOSIS — D692 Other nonthrombocytopenic purpura: Secondary | ICD-10-CM

## 2023-06-13 DIAGNOSIS — Z79899 Other long term (current) drug therapy: Secondary | ICD-10-CM

## 2023-06-13 DIAGNOSIS — C9110 Chronic lymphocytic leukemia of B-cell type not having achieved remission: Secondary | ICD-10-CM | POA: Diagnosis not present

## 2023-06-13 LAB — MICROALBUMIN, URINE WAIVED
Creatinine, Urine Waived: 300 mg/dL (ref 10–300)
Microalb, Ur Waived: 30 mg/L — ABNORMAL HIGH (ref 0–19)
Microalb/Creat Ratio: <30 mg/g (ref <30)

## 2023-06-13 NOTE — Progress Notes (Signed)
 BP 108/63   Pulse 80   Temp 98.7 F (37.1 C) (Oral)   Wt 144 lb 12.8 oz (65.7 kg)   SpO2 98%   BMI 23.37 kg/m    Subjective:    Patient ID: Katherine [name], female    DOB: 1940-12-23, 83 y.o.   MRN: [id number]  HPI: Katherine [name] is a 83 y.o. female  Chief Complaint  Patient presents with   COPD   Depression   Hyperlipidemia   HYPERLIPIDEMIA/HTN Takes Atorvastatin  and Benazepril . Hyperlipidemia status: good compliance Satisfied with current treatment?  yes Side effects:  no Medication compliance: good compliance Supplements: none Aspirin:  no The ASCVD Risk score (Arnett DK, et al., 2019) failed to calculate for the following reasons:   The 2019 ASCVD risk score is only valid for ages 38 to 22 Chest pain:  no Coronary artery disease:  no Family history CAD:  yes Family history early CAD:  no   Impaired Fasting Glucose HbA1C:  Lab Results  Component Value Date   HGBA1C 5.8 (H) 12/10/2022  Duration of elevated blood sugar: years Polydipsia: no Polyuria: no Weight change: no Visual disturbance: no Glucose Monitoring: no    Accucheck frequency: Not Checking    Fasting glucose:     Post prandial:  Diabetic Education: Not Completed Family history of diabetes: no   COPD Emphysema and aortic atherosclerosis noted on past imaging. Uses Breztri  daily and then Albuterol  as needed. Gets Breztri  via assistance. Not a current smoker, has not smoked in >2 years.  Last visit with pulmonary was 12/29/22.  Underlying CLL and is followed closely by oncology with last visit 06/08/23, infusions of Zanubrutinib  in past.  Currently taking Brukinsa , does get diarrhea with this and swelling in feet.  They monitor platelet count, has history of low levels. COPD status: stable Satisfied with current treatment?: yes Oxygen use: no Dyspnea frequency: no Cough frequency: no Rescue inhaler frequency: two times daily, morning and night Limitation of activity: no Productive  cough: none Last Spirometry: with pulmonary Pneumovax: Up to Date Influenza: Up to Date  ANXIETY/STRESS Takes Paxil  30 MG daily. In past took Ativan  0.5 MG BID, but stopped taking several months back as pharmacy kept wanting to talk to her about it.  Continues to do well without it. Pt is aware of risks of benzo medication use to include increased sedation, respiratory suppression, falls, dependence and cardiovascular events. PDMP review last filled 08/17/22. UDS last on 06/10/22. Duration: controlled Anxious mood: no Excessive worrying: no Irritability: occasional Sweating: no Nausea: no Palpitations:no Hyperventilation: no Panic attacks: no Agoraphobia: no  Obscessions/compulsions: no Depressed mood: no    06/13/2023    9:06 AM 12/10/2022   10:14 AM 06/10/2022   11:51 AM 12/09/2021    1:11 PM 08/17/2021    9:46 AM  Depression screen PHQ 2/9  Decreased Interest 0 0 0 0 0  Down, Depressed, Hopeless 0 0 0 0 0  PHQ - 2 Score 0 0 0 0 0  Altered sleeping 0 0 0 0 0  Tired, decreased energy 0 0 0 0 1  Change in appetite 0 0 0 0 0  Feeling bad or failure about yourself  0 0 0 0 0  Trouble concentrating 0 0 0 0 0  Moving slowly or fidgety/restless 0 0 0 0 0  Suicidal thoughts 0 0 0 0 0  PHQ-9 Score 0 0 0 0 1  Difficult doing work/chores Not difficult at all Not difficult at all  Not difficult at all Not difficult at all       06/13/2023    9:07 AM 12/10/2022   10:14 AM 06/10/2022   11:51 AM 12/09/2021    1:10 PM  GAD 7 : Generalized Anxiety Score  Nervous, Anxious, on Edge 0 0 1 1  Control/stop worrying 0 0 1 1  Worry too much - different things 0 0 0 1  Trouble relaxing 0 0 1 1  Restless 0 0 0   Easily annoyed or irritable 0 0 0 1  Afraid - awful might happen 0 0 0   Total GAD 7 Score 0 0 3   Anxiety Difficulty Not difficult at all Not difficult at all Not difficult at all Not difficult at all   Relevant past medical, surgical, family and social history reviewed and updated  as indicated. Interim medical history since our last visit reviewed. Allergies and medications reviewed and updated.  Review of Systems  Constitutional:  Negative for activity change, appetite change, diaphoresis, fatigue and fever.  HENT:  Negative for ear discharge and ear pain.   Respiratory:  Negative for cough, chest tightness, shortness of breath and wheezing.   Cardiovascular:  Negative for chest pain, palpitations and leg swelling.  Gastrointestinal: Negative.   Neurological: Negative.   Psychiatric/Behavioral: Negative.     Per HPI unless specifically indicated above     Objective:    BP 108/63   Pulse 80   Temp 98.7 F (37.1 C) (Oral)   Wt 144 lb 12.8 oz (65.7 kg)   SpO2 98%   BMI 23.37 kg/m   Wt Readings from Last 3 Encounters:  06/13/23 144 lb 12.8 oz (65.7 kg)  06/08/23 144 lb 12.8 oz (65.7 kg)  04/06/23 141 lb (64 kg)    Physical Exam Vitals and nursing note reviewed.  Constitutional:      General: She is awake. She is not in acute distress.    Appearance: She is well-developed and well-groomed. She is not ill-appearing or toxic-appearing.  HENT:     Head: Normocephalic.     Right Ear: Hearing normal.     Left Ear: Hearing normal.  Eyes:     General: Lids are normal.        Right eye: No discharge.        Left eye: No discharge.     Conjunctiva/sclera: Conjunctivae normal.     Pupils: Pupils are equal, round, and reactive to light.  Neck:     Thyroid : No thyromegaly.     Vascular: No carotid bruit.  Cardiovascular:     Rate and Rhythm: Normal rate and regular rhythm.     Heart sounds: Normal heart sounds. No murmur heard.    No gallop.  Pulmonary:     Effort: Pulmonary effort is normal. No accessory muscle usage or respiratory distress.     Breath sounds: Normal breath sounds.  Abdominal:     General: Bowel sounds are normal.     Palpations: Abdomen is soft.  Musculoskeletal:     Cervical back: Normal range of motion and neck supple.     Right  lower leg: 1+ Edema present.     Left lower leg: 1+ Edema present.  Lymphadenopathy:     Cervical: No cervical adenopathy.  Skin:    General: Skin is warm and dry.     Comments: Scattered pale bruising to upper extremities.  Neurological:     Mental Status: She is alert and oriented to person, place,  and time.     Deep Tendon Reflexes: Reflexes are normal and symmetric.     Reflex Scores:      Brachioradialis reflexes are 2+ on the right side and 2+ on the left side.      Patellar reflexes are 2+ on the right side and 2+ on the left side. Psychiatric:        Attention and Perception: Attention normal.        Mood and Affect: Mood normal.        Speech: Speech normal.        Behavior: Behavior normal. Behavior is cooperative.        Thought Content: Thought content normal.    Results for orders placed or performed in visit on 06/08/23  Lactate dehydrogenase   Collection Time: 06/08/23 10:06 AM  Result Value Ref Range   LDH 127 98 - 192 U/L  CBC with Differential (Cancer Center Only)   Collection Time: 06/08/23 10:06 AM  Result Value Ref Range   WBC Count 125.1 (HH) 4.0 - 10.5 K/uL   RBC 4.18 3.87 - 5.11 MIL/uL   Hemoglobin 13.0 12.0 - 15.0 g/dL   HCT 16.1 09.6 - 04.5 %   MCV 102.4 (H) 80.0 - 100.0 fL   MCH 31.1 26.0 - 34.0 pg   MCHC 30.4 30.0 - 36.0 g/dL   RDW 40.9 81.1 - 91.4 %   Platelet Count 114 (L) 150 - 400 K/uL   nRBC 0.0 0.0 - 0.2 %   Neutrophils Relative % 1 %   Neutro Abs 1.6 (L) 1.7 - 7.7 K/uL   Lymphocytes Relative 98 %   Lymphs Abs 122.7 (H) 0.7 - 4.0 K/uL   Monocytes Relative 1 %   Monocytes Absolute 0.6 0.1 - 1.0 K/uL   Eosinophils Relative 0 %   Eosinophils Absolute 0.1 0.0 - 0.5 K/uL   Basophils Relative 0 %   Basophils Absolute 0.0 0.0 - 0.1 K/uL   WBC Morphology      Lymphocytosis with morphology consistent with known diagnosis of CLL.   RBC Morphology MORPHOLOGY UNREMARKABLE    Smear Review Normal platelet morphology    Immature Granulocytes 0 %    Abs Immature Granulocytes 0.14 (H) 0.00 - 0.07 K/uL  CMP (Cancer Center only)   Collection Time: 06/08/23 10:07 AM  Result Value Ref Range   Sodium 139 135 - 145 mmol/L   Potassium 4.1 3.5 - 5.1 mmol/L   Chloride 103 98 - 111 mmol/L   CO2 27 22 - 32 mmol/L   Glucose, Bld 93 70 - 99 mg/dL   BUN 14 8 - 23 mg/dL   Creatinine 7.82 9.56 - 1.00 mg/dL   Calcium  9.6 8.9 - 10.3 mg/dL   Total Protein 6.8 6.5 - 8.1 g/dL   Albumin 4.7 3.5 - 5.0 g/dL   AST 19 15 - 41 U/L   ALT 13 0 - 44 U/L   Alkaline Phosphatase 76 38 - 126 U/L   Total Bilirubin 1.4 (H) 0.0 - 1.2 mg/dL   GFR, Estimated >21 >30 mL/min   Anion gap 9 5 - 15      Assessment & Plan:   Problem List Items Addressed This Visit       Cardiovascular and Mediastinum   Senile purpura (HCC)   Chronic.  Noted on exam, recommend gentle skin care and monitoring for wounds, if wounds present immediately alert provider.      Essential hypertension   Chronic, stable.  BP  at goal today in office.  Continue current medication regimen and adjust as needed.  Recommend she monitor BP at least a few mornings a week at home and document.  DASH diet at home.  LABS: up to date with oncology.  Could consider change to ARB in future due to her underlying COPD, has been on ACE for several years.         Relevant Orders   Microalbumin, Urine Waived   Atherosclerosis of aorta (HCC)   Chronic, ongoing.  Noted on imaging in past, recommend continued cessation of smoking and continue daily statin for prevention.        Respiratory   Centrilobular emphysema (HCC)   Chronic, stable.  Continue current medication regimen as ordered by pulmonary + continue use of InCourage vest.   Recommend she avoid use of Benadryl  due to age >22 and BEERS criteria, utilize Claritin instead.  Currently stable with pulmonary regimen.          Hematopoietic and Hemostatic   Thrombocytopenia (HCC)   With underlying CLL, followed by oncology.  Continue this  collaboration. Recent labs and notes reviewed.        Other   Hypercholesterolemia   Chronic, ongoing.  Continue current medication regimen and adjust as needed.  Lipid panel today.       Relevant Orders   Lipid Panel w/o Chol/HDL Ratio   Elevated hemoglobin A1c measurement   5.8% on past labs, possibly related to Prednisone  in past.  Recheck today and monitor closely.  Focus on diet and regular exercise.      Relevant Orders   HgB A1c   Depression, recurrent (HCC)   Chronic, stable with no SI/HI.  Continue Paxil  at this time and consider transition in future to Sertraline due to age >39 and anticholinergic effects with Paxil .  She self stopped Ativan  several months ago and reports doing well without it. Will monitor. UDS last 06/10/22 and contract on file - will recheck UDS if returns to benzo use.  Return in 6 months, has multiple specialty visits with CLL -- will span follow-up out.      CLL (chronic lymphocytic leukemia) (HCC) - Primary   Chronic, under treatment with oncology.  Recent note and labs reviewed.  Continue collaboration with oncology provider.  Discussed goals of care with patient.      Anxiety   Refer to depression plan of care.        Follow up plan: Return in about 6 months (around 12/13/2023) for COPD, HTN/HLD, CLL, MOOD.

## 2023-06-13 NOTE — Assessment & Plan Note (Signed)
 5.8% on past labs, possibly related to Prednisone  in past.  Recheck today and monitor closely.  Focus on diet and regular exercise.

## 2023-06-13 NOTE — Assessment & Plan Note (Signed)
 With underlying CLL, followed by oncology.  Continue this collaboration. Recent labs and notes reviewed.

## 2023-06-13 NOTE — Assessment & Plan Note (Signed)
 Chronic, ongoing.  Continue current medication regimen and adjust as needed. Lipid panel today.

## 2023-06-13 NOTE — Assessment & Plan Note (Signed)
Chronic, under treatment with oncology.  Recent note and labs reviewed.  Continue collaboration with oncology provider.  Discussed goals of care with patient.

## 2023-06-13 NOTE — Assessment & Plan Note (Signed)
Chronic.  Noted on exam, recommend gentle skin care and monitoring for wounds, if wounds present immediately alert provider.

## 2023-06-13 NOTE — Assessment & Plan Note (Signed)
 Chronic, stable.  BP at goal today in office.  Continue current medication regimen and adjust as needed.  Recommend she monitor BP at least a few mornings a week at home and document.  DASH diet at home.  LABS: up to date with oncology.  Could consider change to ARB in future due to her underlying COPD, has been on ACE for several years.

## 2023-06-13 NOTE — Assessment & Plan Note (Signed)
Chronic, stable.  Continue current medication regimen as ordered by pulmonary + continue use of InCourage vest.   Recommend she avoid use of Benadryl due to age >49 and BEERS criteria, utilize Claritin instead.  Currently stable with pulmonary regimen.

## 2023-06-13 NOTE — Assessment & Plan Note (Signed)
 Refer to depression plan of care.

## 2023-06-13 NOTE — Assessment & Plan Note (Signed)
 Chronic, stable with no SI/HI.  Continue Paxil  at this time and consider transition in future to Sertraline due to age >9 and anticholinergic effects with Paxil .  She self stopped Ativan  several months ago and reports doing well without it. Will monitor. UDS last 06/10/22 and contract on file - will recheck UDS if returns to benzo use.  Return in 6 months, has multiple specialty visits with CLL -- will span follow-up out.

## 2023-06-13 NOTE — Assessment & Plan Note (Signed)
Chronic, ongoing.  Noted on imaging in past, recommend continued cessation of smoking and continue daily statin for prevention.

## 2023-06-14 ENCOUNTER — Encounter: Payer: Self-pay | Admitting: Nurse Practitioner

## 2023-06-14 LAB — LIPID PANEL W/O CHOL/HDL RATIO
Cholesterol, Total: 146 mg/dL (ref 100–199)
HDL: 57 mg/dL (ref >39)
LDL Chol Calc (NIH): 72 mg/dL (ref 0–99)
Triglycerides: 93 mg/dL (ref 0–149)
VLDL Cholesterol Cal: 17 mg/dL (ref 5–40)

## 2023-06-14 LAB — HEMOGLOBIN A1C
Est. average glucose Bld gHb Est-mCnc: 126 mg/dL
Hgb A1c MFr Bld: 6 % — ABNORMAL HIGH (ref 4.8–5.6)

## 2023-06-14 NOTE — Progress Notes (Signed)
 Contacted via MyChart   Good evening Beadie, your labs have returned. A1c remains in prediabetic range with no worsening.  Lipid panel levels are stable.  Great news!!  Any questions? Keep being amazing!!  Thank you for allowing me to participate in your care.  I appreciate you. Kindest regards, Ally Knodel

## 2023-06-15 LAB — IMMUNOGLOBULINS A/E/G/M, SERUM
IgA: 17 mg/dL — ABNORMAL LOW (ref 64–422)
IgE (Immunoglobulin E), Serum: <2 [IU]/mL — ABNORMAL LOW (ref 6–495)
IgG (Immunoglobin G), Serum: 203 mg/dL — ABNORMAL LOW (ref 586–1602)
IgM (Immunoglobulin M), Srm: 6 mg/dL — ABNORMAL LOW (ref 26–217)

## 2023-06-17 ENCOUNTER — Telehealth: Payer: Self-pay

## 2023-06-17 NOTE — Telephone Encounter (Signed)
 Copied from CRM [id number]. Topic: General - Other >> Jun 17, 2023  8:59 AM Lorenz Romano B wrote: Reason for CRM: patient called in for lab results I gave her the results and patient was ok with everything

## 2023-06-21 ENCOUNTER — Other Ambulatory Visit (HOSPITAL_COMMUNITY): Payer: Self-pay

## 2023-06-21 ENCOUNTER — Other Ambulatory Visit: Payer: Self-pay

## 2023-06-23 ENCOUNTER — Other Ambulatory Visit: Payer: Self-pay

## 2023-06-23 ENCOUNTER — Other Ambulatory Visit: Payer: Self-pay | Admitting: Pharmacy Technician

## 2023-06-23 NOTE — Progress Notes (Signed)
 Specialty Pharmacy Refill Coordination Note  Katherine [name] is a 83 y.o. female contacted today regarding refills of specialty medication(s) Zanubrutinib  (Brukinsa )   Patient requested Delivery   Delivery date: 06/28/23   Verified address: [address], [place], [place] [address]   Medication will be filled on 06/27/23.

## 2023-06-30 DIAGNOSIS — L578 Other skin changes due to chronic exposure to nonionizing radiation: Secondary | ICD-10-CM | POA: Diagnosis not present

## 2023-06-30 DIAGNOSIS — Z859 Personal history of malignant neoplasm, unspecified: Secondary | ICD-10-CM | POA: Diagnosis not present

## 2023-07-13 ENCOUNTER — Other Ambulatory Visit: Payer: Self-pay

## 2023-07-19 ENCOUNTER — Other Ambulatory Visit (HOSPITAL_COMMUNITY): Payer: Self-pay

## 2023-07-19 NOTE — Progress Notes (Signed)
 Specialty Pharmacy Ongoing Clinical Assessment Note  Katherine [name] is a 83 y.o. female who is being followed by the specialty pharmacy service for RxSp Oncology   Patient's specialty medication(s) reviewed today: Zanubrutinib  (Brukinsa )   Missed doses in the last 4 weeks: 0   Patient/Caregiver did not have any additional questions or concerns.   Therapeutic benefit summary: Patient is achieving benefit   Adverse events/side effects summary: Experienced adverse events/side effects (diarrhea, tolerable with Imodium PRN)   Patient's therapy is appropriate to: Continue    Goals Addressed             This Visit's Progress    Stabilization of disease   On track    Patient is on track. Patient will maintain adherence.  Patient's white count is improving as of office visit on 06/08/23.         Follow up: 6 months  Malachi Screws Specialty Pharmacist

## 2023-07-30 ENCOUNTER — Other Ambulatory Visit: Payer: Self-pay | Admitting: Nurse Practitioner

## 2023-07-30 DIAGNOSIS — I10 Essential (primary) hypertension: Secondary | ICD-10-CM

## 2023-08-01 NOTE — Telephone Encounter (Signed)
 Requested Prescriptions  Pending Prescriptions Disp Refills   benazepril  (LOTENSIN ) 40 MG tablet [Pharmacy Med Name: BENAZEPRIL  HCL 40 MG TABLET] 90 tablet 1    Sig: TAKE 1 TABLET BY MOUTH EVERY DAY     Cardiovascular:  ACE Inhibitors Passed - 08/01/2023  2:28 PM      Passed - Cr in normal range and within 180 days    Creatinine  Date Value Ref Range Status  06/08/2023 0.86 0.44 - 1.00 mg/dL Final  41/32/4401 027.2 20.0 - 300.0 mg/dL Final  53/66/4403 4.74 0.60 - 1.30 mg/dL Final         Passed - K in normal range and within 180 days    Potassium  Date Value Ref Range Status  06/08/2023 4.1 3.5 - 5.1 mmol/L Final  08/10/2013 4.1 3.5 - 5.1 mmol/L Final         Passed - Patient is not pregnant      Passed - Last BP in normal range    BP Readings from Last 1 Encounters:  06/13/23 108/63         Passed - Valid encounter within last 6 months    Recent Outpatient Visits           1 month ago CLL (chronic lymphocytic leukemia) (HCC)   [facility], Lavelle Posey, NP

## 2023-08-04 ENCOUNTER — Other Ambulatory Visit: Payer: Self-pay

## 2023-08-04 ENCOUNTER — Other Ambulatory Visit: Payer: Self-pay | Admitting: Internal Medicine

## 2023-08-04 DIAGNOSIS — C9110 Chronic lymphocytic leukemia of B-cell type not having achieved remission: Secondary | ICD-10-CM

## 2023-08-04 NOTE — Progress Notes (Signed)
 Specialty Pharmacy Refill Coordination Note  Katherine [name] is a 83 y.o. female contacted today regarding refills of specialty medication(s) Zanubrutinib  (Brukinsa )   Patient requested Delivery   Delivery date: 08/08/23   Verified address: [address], [place], [place] [address]   Medication will be filled on 08/05/23.   This fill date is pending response to refill request from provider. Patient is aware and if they have not received fill by intended date they must follow up with pharmacy.

## 2023-08-05 ENCOUNTER — Other Ambulatory Visit (HOSPITAL_COMMUNITY): Payer: Self-pay

## 2023-08-05 ENCOUNTER — Other Ambulatory Visit: Payer: Self-pay

## 2023-08-05 ENCOUNTER — Other Ambulatory Visit: Payer: Self-pay | Admitting: Internal Medicine

## 2023-08-05 DIAGNOSIS — C9110 Chronic lymphocytic leukemia of B-cell type not having achieved remission: Secondary | ICD-10-CM

## 2023-08-05 MED ORDER — BRUKINSA 80 MG PO CAPS
80.0000 mg | ORAL_CAPSULE | Freq: Every day | ORAL | 1 refills | Status: DC
  Filled 2023-08-05: qty 30, 30d supply, fill #0
  Filled 2023-08-29: qty 30, 30d supply, fill #1

## 2023-08-05 NOTE — Progress Notes (Signed)
 Patient  Brukinsa  refill came in after 2 past the [employer] cut-off time. Called patient to inform of new delivery date for 6/17. Patient is fine with it.

## 2023-08-10 ENCOUNTER — Encounter: Payer: Self-pay | Admitting: Internal Medicine

## 2023-08-10 ENCOUNTER — Inpatient Hospital Stay: Attending: Internal Medicine

## 2023-08-10 ENCOUNTER — Inpatient Hospital Stay: Admitting: Internal Medicine

## 2023-08-10 VITALS — BP 104/87 | HR 74 | Temp 98.0°F | Resp 16 | Ht 66.0 in | Wt 144.7 lb

## 2023-08-10 DIAGNOSIS — Z8701 Personal history of pneumonia (recurrent): Secondary | ICD-10-CM | POA: Insufficient documentation

## 2023-08-10 DIAGNOSIS — N920 Excessive and frequent menstruation with regular cycle: Secondary | ICD-10-CM | POA: Insufficient documentation

## 2023-08-10 DIAGNOSIS — R252 Cramp and spasm: Secondary | ICD-10-CM | POA: Diagnosis not present

## 2023-08-10 DIAGNOSIS — D6481 Anemia due to antineoplastic chemotherapy: Secondary | ICD-10-CM | POA: Insufficient documentation

## 2023-08-10 DIAGNOSIS — R197 Diarrhea, unspecified: Secondary | ICD-10-CM | POA: Insufficient documentation

## 2023-08-10 DIAGNOSIS — J449 Chronic obstructive pulmonary disease, unspecified: Secondary | ICD-10-CM | POA: Diagnosis not present

## 2023-08-10 DIAGNOSIS — C4492 Squamous cell carcinoma of skin, unspecified: Secondary | ICD-10-CM | POA: Insufficient documentation

## 2023-08-10 DIAGNOSIS — R5382 Chronic fatigue, unspecified: Secondary | ICD-10-CM | POA: Insufficient documentation

## 2023-08-10 DIAGNOSIS — Z87891 Personal history of nicotine dependence: Secondary | ICD-10-CM | POA: Insufficient documentation

## 2023-08-10 DIAGNOSIS — G4700 Insomnia, unspecified: Secondary | ICD-10-CM | POA: Diagnosis not present

## 2023-08-10 DIAGNOSIS — M7989 Other specified soft tissue disorders: Secondary | ICD-10-CM | POA: Diagnosis not present

## 2023-08-10 DIAGNOSIS — R233 Spontaneous ecchymoses: Secondary | ICD-10-CM | POA: Insufficient documentation

## 2023-08-10 DIAGNOSIS — C9110 Chronic lymphocytic leukemia of B-cell type not having achieved remission: Secondary | ICD-10-CM | POA: Insufficient documentation

## 2023-08-10 LAB — CBC WITH DIFFERENTIAL (CANCER CENTER ONLY)
Abs Immature Granulocytes: 0.11 10*3/uL — ABNORMAL HIGH (ref 0.00–0.07)
Basophils Absolute: 0.1 10*3/uL (ref 0.0–0.1)
Basophils Relative: 0 %
Eosinophils Absolute: 0.1 10*3/uL (ref 0.0–0.5)
Eosinophils Relative: 0 %
HCT: 40 % (ref 36.0–46.0)
Hemoglobin: 13 g/dL (ref 12.0–15.0)
Immature Granulocytes: 0 %
Lymphocytes Relative: 97 %
Lymphs Abs: 74.9 10*3/uL — ABNORMAL HIGH (ref 0.7–4.0)
MCH: 32.9 pg (ref 26.0–34.0)
MCHC: 32.5 g/dL (ref 30.0–36.0)
MCV: 101.3 fL — ABNORMAL HIGH (ref 80.0–100.0)
Monocytes Absolute: 0.5 10*3/uL (ref 0.1–1.0)
Monocytes Relative: 1 %
Neutro Abs: 1.7 10*3/uL (ref 1.7–7.7)
Neutrophils Relative %: 2 %
Platelet Count: 134 10*3/uL — ABNORMAL LOW (ref 150–400)
RBC: 3.95 MIL/uL (ref 3.87–5.11)
RDW: 12.9 % (ref 11.5–15.5)
Smear Review: NORMAL
WBC Count: 77.4 10*3/uL (ref 4.0–10.5)
nRBC: 0 % (ref 0.0–0.2)

## 2023-08-10 LAB — CMP (CANCER CENTER ONLY)
ALT: 14 U/L (ref 0–44)
AST: 21 U/L (ref 15–41)
Albumin: 4.5 g/dL (ref 3.5–5.0)
Alkaline Phosphatase: 70 U/L (ref 38–126)
Anion gap: 10 (ref 5–15)
BUN: 12 mg/dL (ref 8–23)
CO2: 26 mmol/L (ref 22–32)
Calcium: 9.2 mg/dL (ref 8.9–10.3)
Chloride: 105 mmol/L (ref 98–111)
Creatinine: 0.78 mg/dL (ref 0.44–1.00)
GFR, Estimated: >60 mL/min (ref >60)
Glucose, Bld: 90 mg/dL (ref 70–99)
Potassium: 3.8 mmol/L (ref 3.5–5.1)
Sodium: 141 mmol/L (ref 135–145)
Total Bilirubin: 1.2 mg/dL (ref 0.0–1.2)
Total Protein: 6.5 g/dL (ref 6.5–8.1)

## 2023-08-10 LAB — LACTATE DEHYDROGENASE: LDH: 148 U/L (ref 98–192)

## 2023-08-10 NOTE — Progress Notes (Signed)
 [facility] CONSULT NOTE  Patient Care Team: Lemar Pyles, NP as PCP - General (Nurse Practitioner) Isenstein, Arin L, MD (Dermatology) Cassie Click, MD (Inactive) (Gastroenterology) Marquita Situ Magali Schmitz, MD (General Surgery) Gwyn Leos, MD as Consulting Physician (Oncology) Marc Senior, MD as Consulting Physician (Pulmonary Disease)  CHIEF COMPLAINTS/PURPOSE OF CONSULTATION:  CLL  #  Oncology History Overview Note  Katherine [name] is a 83 y.o. female with chronic lymphocytic leukemia (CLL).  WBC has ranged between 22,000 - 101,7000 since 05/2011.   She has received Rituxan  x 2 four week cycles (06/27/2015 and 05/27/2017).  Hepatitis B surface antigen and hepatitis B surface antibody were negative on 07/04/2015.   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  CCND1, ATM, chromosome 12, and TP53 were normal.     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  There was a CD5 and CD23 positive monoclonal B cell population with lambda light chain restriction, negative for FMC7 and CD38, representing  88% of leukocytes, >5,000/uL. There was no loss of, or aberrant expression of, the pan T cell  antigens to  suggest a neoplastic T cell process. CD4:CD8 ratio 2.0  No circulating blasts were detected. There was no immunophenotypic evidence of abnormal myeloid maturation.  IGH/BCL2 by FISH is pending. --------------------------------------------------------  She began ibrutinib  on 04/24/2019 (discontinued on 05/30/2019).                     She developed blood-streaked sputum (slight) then significant hematuria.                     She declined further ibrutinib . ---------------------------------------------------------------------------------            She began acalabrutinib  on 03/14/2020 (held on 05/13/2020 secondary  to diarrhea). --------------------------------------------------------------------------------------   She has a 35 pack year smoking history.  She is in the low dose chest CT program.  Low dose chest CT on 02/02/2018 revealed a new endobronchial lesion in the segmental bronchus to the medial segment of the right middle lobe. This may simply reflect an area of retained secretions, however, the possibility of an endobronchial neoplasm should be considered. Lung-RADS 4AS, suspicious.  Low dose chest CT on 07/05/2018 revealed Lung-RADS 2, benign appearance or behavior.  Prior right middle lobe endobronchial lesion/mucous plugging was no longer visualized.  New mucous plugging in the right lower lobe.  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  Accompanied by her  daughter.   Katherine [name] 83 y.o.  female CLL-13 q. Deletion currently on Brukinsa /Zanubrutinib  [started in sep 2024 ]; COPD- SCC of skin-is here for follow-up.  Pt c/o cramps in her legs, comes and goes. Improved on magnesium. Continues to have ankles are swelling.  Otherwise she admits to compliance with her zanu pill.  Compliance with her iron pill.  Continues to chronic fatigue.Otherwise denies any blood in stools or black-colored stools.  No recent infections.    Review of Systems  Constitutional:  Positive for malaise/fatigue. Negative for chills, diaphoresis, fever and weight loss.  HENT:  Negative for nosebleeds and sore throat.   Eyes:  Negative  for double vision.  Respiratory:  Positive for hemoptysis and shortness of breath. Negative for cough, sputum production and wheezing.   Cardiovascular:  Negative for chest pain, palpitations,  orthopnea and leg swelling.  Gastrointestinal:  Negative for abdominal pain, blood in stool, constipation, diarrhea, heartburn, melena, nausea and vomiting.  Genitourinary:  Negative for dysuria, frequency and urgency.  Musculoskeletal:  Positive for back pain. Negative for joint pain.  Skin: Negative.  Negative for itching and rash.  Neurological:  Negative for dizziness, tingling, focal weakness, weakness and headaches.  Endo/Heme/Allergies:  Does not bruise/bleed easily.  Psychiatric/Behavioral:  Negative for depression. The patient is not nervous/anxious and does not have insomnia.      MEDICAL HISTORY:  Past Medical History:  Diagnosis Date   Allergy    Anxiety    Depression    GERD (gastroesophageal reflux disease)    Hemorrhoids    Hypertension    Leukemia, lymphoid (HCC)    CLL   Lobar pneumonia (HCC)    Osteopenia    Personal history of chemotherapy     SURGICAL HISTORY: Past Surgical History:  Procedure Laterality Date   ABDOMINAL HYSTERECTOMY     APPENDECTOMY     BREAST BIOPSY Left    bx x 3-neg   COLON SURGERY     sigmoid resection   COLONOSCOPY     2007, 2012   COLONOSCOPY WITH PROPOFOL  N/A 09/17/2015   Procedure: COLONOSCOPY WITH PROPOFOL ;  Surgeon: Marshall Skeeter, MD;  Location: [facility] ENDOSCOPY;  Service: Endoscopy;  Laterality: N/A;   OOPHORECTOMY     SPINE SURGERY     L4-5    SOCIAL HISTORY: Social History   Socioeconomic History   Marital status: Married    Spouse name: Not on file   Number of children: Not on file   Years of education: 12   Highest education level: 12th grade  Occupational History   Not on file  Tobacco Use   Smoking status: Former    Current packs/day: 0.00    Average packs/day: 1 pack/day for 35.0 years (35.0 ttl pk-yrs)    Types: Cigarettes    Start date: 02/22/1986    Quit date: 02/22/2021    Years since quitting: 2.4   Smokeless tobacco: Never  Vaping Use   Vaping status: Never Used  Substance and Sexual Activity    Alcohol use: No    Alcohol/week: 0.0 standard drinks of alcohol   Drug use: No   Sexual activity: Not on file  Other Topics Concern   Not on file  Social History Narrative   Not on file   Social Drivers of Health   Financial Resource Strain: Low Risk  (06/10/2022)   Overall Financial Resource Strain (CARDIA)    Difficulty of Paying Living Expenses: Not hard at all  Food Insecurity: No Food Insecurity (11/20/2022)   Hunger Vital Sign    Worried About Running Out of Food in the Last Year: Never true    Ran Out of Food in the Last Year: Never true  Transportation Needs: No Transportation Needs (11/20/2022)   PRAPARE - Administrator, Civil Service (Medical): No    Lack of Transportation (Non-Medical): No  Physical Activity: Insufficiently Active (06/10/2022)   Exercise Vital Sign    Days of Exercise per Week: 2 days    Minutes of Exercise per Session: 20 min  Stress: No Stress Concern Present (06/10/2022)   [employer] of Occupational Health - Occupational Stress Questionnaire  Feeling of Stress : Only a little  Social Connections: Moderately Integrated (06/10/2022)   Social Connection and Isolation Panel    Frequency of Communication with Friends and Family: More than three times a week    Frequency of Social Gatherings with Friends and Family: More than three times a week    Attends Religious Services: More than 4 times per year    Active Member of [employer] or Organizations: No    Attends Banker Meetings: Never    Marital Status: Married  Catering manager Violence: Not At Risk (11/20/2022)   Humiliation, Afraid, Rape, and Kick questionnaire    Fear of Current or Ex-Partner: No    Emotionally Abused: No    Physically Abused: No    Sexually Abused: No    FAMILY HISTORY: Family History  Problem Relation Age of Onset   Osteoporosis Mother    Hypertension Father    Heart attack Father    Stroke Maternal Grandfather    Breast cancer Sister 78     ALLERGIES:  is allergic to augmentin [amoxicillin-pot clavulanate] and biaxin [clarithromycin].  MEDICATIONS:  Current Outpatient Medications  Medication Sig Dispense Refill   acetaminophen  (TYLENOL ) 325 MG tablet Take by mouth.     albuterol  (VENTOLIN  HFA) 108 (90 Base) MCG/ACT inhaler TAKE 2 PUFFS BY MOUTH EVERY 6 HOURS AS NEEDED FOR WHEEZE OR SHORTNESS OF BREATH 18 each 59   atorvastatin  (LIPITOR) 10 MG tablet TAKE 1 TABLET BY MOUTH EVERY DAY 90 tablet 3   benazepril  (LOTENSIN ) 40 MG tablet TAKE 1 TABLET BY MOUTH EVERY DAY 90 tablet 1   BREZTRI  AEROSPHERE 160-9-4.8 MCG/ACT AERO INHALE 2 PUFFS INTO THE LUNGS IN THE MORNING AND AT BEDTIME. 10.7 each 11   cholecalciferol (VITAMIN D3) 25 MCG (1000 UNIT) tablet Take 1,000 Units by mouth daily.     Cyanocobalamin (VITAMIN B-12) 5000 MCG TBDP Take 1 tablet by mouth daily at 2 PM.     diphenoxylate -atropine  (LOMOTIL ) 2.5-0.025 MG tablet Take 1 tablet by mouth 4 (four) times daily as needed for diarrhea or loose stools. Take it along with immodium 60 tablet 1   hydrocortisone  (ANUSOL -HC) 25 MG suppository Short term use as needed.  Home med.     mometasone  (NASONEX ) 50 MCG/ACT nasal spray USE 1 SPRAY TO EACH NOSTRIL TWICE A DAY 51 each 1   Multiple Vitamins-Minerals (MULTIVITAMIN WITH MINERALS) tablet Take 1 tablet by mouth daily.     PARoxetine  (PAXIL ) 30 MG tablet Take 1 tablet (30 mg total) by mouth daily. 90 tablet 4   zanubrutinib  (BRUKINSA ) 80 MG capsule Take 1 capsule (80 mg total) by mouth daily. 30 capsule 1   No current facility-administered medications for this visit.      Aaron Aas  PHYSICAL EXAMINATION: ECOG PERFORMANCE STATUS: 1 - Symptomatic but completely ambulatory  Vitals:   08/10/23 0944  BP: 104/87  Pulse: 74  Resp: 16  Temp: 98 F (36.7 C)  SpO2: 98%     Filed Weights   08/10/23 0944  Weight: 144 lb 11.2 oz (65.6 kg)    Spleen-up to umbilicus.  Physical Exam HENT:     Head: Normocephalic and atraumatic.      Mouth/Throat:     Pharynx: No oropharyngeal exudate.   Eyes:     Pupils: Pupils are equal, round, and reactive to light.    Cardiovascular:     Rate and Rhythm: Normal rate and regular rhythm.  Pulmonary:     Effort: No respiratory distress.  Breath sounds: No wheezing.     Comments: Decreased air entry bilaterally at the bases. Abdominal:     General: Bowel sounds are normal. There is no distension.     Palpations: Abdomen is soft. There is no mass.     Tenderness: There is no abdominal tenderness. There is no guarding or rebound.   Musculoskeletal:        General: No tenderness. Normal range of motion.     Cervical back: Normal range of motion and neck supple.   Skin:    General: Skin is warm.   Neurological:     Mental Status: She is alert and oriented to person, place, and time.   Psychiatric:        Mood and Affect: Affect normal.      LABORATORY DATA:  I have reviewed the data as listed Lab Results  Component Value Date   WBC 77.4 (HH) 08/10/2023   HGB 13.0 08/10/2023   HCT 40.0 08/10/2023   MCV 101.3 (H) 08/10/2023   PLT 134 (L) 08/10/2023   Recent Labs    04/06/23 1009 06/08/23 1007 08/10/23 0942  NA 138 139 141  K 4.0 4.1 3.8  CL 104 103 105  CO2 24 27 26   GLUCOSE 98 93 90  BUN 15 14 12   CREATININE 0.90 0.86 0.78  CALCIUM  9.3 9.6 9.2  GFRNONAA >60 >60 >60  PROT 6.2* 6.8 6.5  ALBUMIN 4.4 4.7 4.5  AST 18 19 21   ALT 13 13 14   ALKPHOS 70 76 70  BILITOT 1.0 1.4* 1.2    RADIOGRAPHIC STUDIES: I have personally reviewed the radiological images as listed and agreed with the findings in the report. No results found.  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 Deletion-poor tolerance to Ibrutinib -hematuria; ; and Acalburtinib [stopped in March 2022 because of diarrhea]. # AUG 21st, 2024- CT chest [Dr.Gonzalez]- Partially visualized splenomegaly, increased-again suggestive of progressive CLL.   Spleen up to umbilicus. Currently on Zanubrutinib  80 mg In AM [for last 3 weeks- SEP mid 2024].   # Patient tolerating Zanu 80 mg a day fairly well-monitor closely in the context of recent GI bleed see below.  White count 77-improving thrombocytopenia improved.   # Anemia multifactorial CLL/GI bleed.  No hemolysis. Continue  gentle iron [iron biglycinate; 28 mg ] 1 pill a day-today hemoglobin is 13 improved from a baseline of 8. Stable.   # Bil Leg cramps/insomnia- Magnesium L-threonate is often used to improve sleep quality- stable.   # Bil LE swelling: sec to Zanubrutinib - G-1-2;continue Stocking/ leg elevation-  Stable  # Diarrhea grade 1 secondary to zabu continue in addition to Imodium recommend Lomotil . 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 APRIL 2025-   immunoglobulins- IgG-  203.  Patient declines given concerns of diarrhea./Poor tolerance. Stable  #COPD/history of pneumonia -bilateral Jan 2023 [QUITsmoking-April, 2023; [facility]]-right middle lobe lung atelectasis [Dr.Gonzalez] - AUG 2024- CT- Improved aeration of the right middle lobe with residual mild plate like scarring versus atelectasis in the right middle lobe. Stable  # Easy brusing sec to zanurbitinib-stable.   # Vaccination:s/p flu shot; ok with  covid for now. Recommend shingles vaccination [remote]   # DISPOSITION:  # Follow up in 3 months- MD: Labs- cbc/cmp; LDH;quantitive immunoglobulin- Dr.B       All questions were answered. The patient  knows to call the clinic with any problems, questions or concerns.   Gwyn Leos, MD 08/10/2023 11:00 AM

## 2023-08-10 NOTE — Progress Notes (Signed)
 Critical lab called by Micheline Ahr in lab; WBC's 77.4 today. Readback. Dr B notified.

## 2023-08-10 NOTE — Progress Notes (Signed)
 C/o not being able to sleep, taking magnesium, unsure of mg. Would like recommendation.

## 2023-08-10 NOTE — Assessment & Plan Note (Addendum)
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 # AUG 21st, 2024- CT chest [Dr.Gonzalez]- Partially visualized splenomegaly, increased-again suggestive of progressive CLL.  Spleen up to umbilicus. Currently on Zanubrutinib  80 mg In AM [for last 3 weeks- SEP mid 2024].   # Patient tolerating Zanu 80 mg a day fairly well-monitor closely in the context of recent GI bleed see below.  White count 77-improving thrombocytopenia improved.   # Anemia multifactorial CLL/GI bleed.  No hemolysis. Continue  gentle iron [iron biglycinate; 28 mg ] 1 pill a day-today hemoglobin is 13 improved from a baseline of 8. Stable.   # Bil Leg cramps/insomnia- Magnesium L-threonate is often used to improve sleep quality- stable.   # Bil LE swelling: sec to Zanubrutinib - G-1-2;continue Stocking/ leg elevation-  Stable  # Diarrhea grade 1 secondary to zabu continue in addition to Imodium recommend Lomotil . 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 APRIL 2025-   immunoglobulins- IgG-  203.  Patient declines given concerns of diarrhea./Poor tolerance. Stable  #COPD/history of pneumonia -bilateral Jan 2023 [QUITsmoking-April, 2023; [facility]]-right middle lobe lung atelectasis [Dr.Gonzalez] - AUG 2024- CT- Improved aeration of the right middle lobe with residual mild plate like scarring versus atelectasis in the right middle lobe. Stable  # Easy brusing sec to zanurbitinib-stable.   # Vaccination:s/p flu shot; ok with  covid for now. Recommend shingles vaccination [remote]   # DISPOSITION:  # Follow up in 3 months- MD: Labs- cbc/cmp; LDH;quantitive immunoglobulin- Dr.B

## 2023-08-12 LAB — IMMUNOGLOBULINS A/E/G/M, SERUM
IgA: 16 mg/dL — ABNORMAL LOW (ref 64–422)
IgE (Immunoglobulin E), Serum: <2 [IU]/mL — ABNORMAL LOW (ref 6–495)
IgG (Immunoglobin G), Serum: 178 mg/dL — ABNORMAL LOW (ref 586–1602)
IgM (Immunoglobulin M), Srm: 5 mg/dL — ABNORMAL LOW (ref 26–217)

## 2023-08-25 ENCOUNTER — Other Ambulatory Visit: Payer: Self-pay

## 2023-08-26 ENCOUNTER — Other Ambulatory Visit: Payer: Self-pay | Admitting: Nurse Practitioner

## 2023-08-29 ENCOUNTER — Telehealth: Payer: Self-pay | Admitting: Nurse Practitioner

## 2023-08-29 ENCOUNTER — Other Ambulatory Visit: Payer: Self-pay

## 2023-08-29 NOTE — Progress Notes (Signed)
 Specialty Pharmacy Refill Coordination Note  Katherine [name] is a 83 y.o. female contacted today regarding refills of specialty medication(s) Zanubrutinib  (Brukinsa )   Patient requested Delivery   Delivery date: 09/02/23   Verified address: [address], [place], [place] [address]   Medication will be filled on 09/01/23.

## 2023-08-29 NOTE — Telephone Encounter (Signed)
 Copied from CRM [id number]. Topic: Medicare AWV >> Aug 29, 2023  3:46 PM Nathanel DEL wrote: Reason for CRM: LVM 08/29/2023 to schedule AWV. Please schedule office or virtual visits.  Nathanel Paschal; Care Guide Ambulatory Clinical Support [place] [facility] Direct Dial: [phone]

## 2023-08-30 NOTE — Telephone Encounter (Signed)
 Requested Prescriptions  Pending Prescriptions Disp Refills   PARoxetine  (PAXIL ) 30 MG tablet [Pharmacy Med Name: PAROXETINE  HCL 30 MG TABLET] 90 tablet 1    Sig: TAKE 1 TABLET BY MOUTH EVERY DAY     Psychiatry:  Antidepressants - SSRI Passed - 08/30/2023 11:04 AM      Passed - Completed PHQ-2 or PHQ-9 in the last 360 days      Passed - Valid encounter within last 6 months    Recent Outpatient Visits           2 months ago CLL (chronic lymphocytic leukemia) (HCC)   [facility] [place], Melanie DASEN, NP

## 2023-08-31 ENCOUNTER — Other Ambulatory Visit: Payer: Self-pay

## 2023-09-14 ENCOUNTER — Telehealth: Payer: Self-pay | Admitting: *Deleted

## 2023-09-14 NOTE — Telephone Encounter (Signed)
 Patient aware to follow up with dermatology per [facility].

## 2023-09-14 NOTE — Telephone Encounter (Signed)
 Patient has some questions and I can take the information and give it to the doctor. The patient has seen a place from 2 to 3 weeks ago about the left arm right above the elbow and it is a dark circle and it is raised up, and she has a right on the back leg and it looks like a half circle on that area to.  And she has on the front right shin it has a raised dark and sore.  She has not hit herself anywhere but it just keeps coming up on certain areas and she wanted to know if that something to send to Dr. To see what is going on she did actually put an appointment for dermatologist because it seems like it was on the the arms and the legs but the appointment is not till August to 12th.  She just wants to know if that is okay or is it something that she needs to come in to be seen when Dr. Erby this information.

## 2023-09-20 ENCOUNTER — Other Ambulatory Visit (HOSPITAL_COMMUNITY): Payer: Self-pay

## 2023-09-21 ENCOUNTER — Other Ambulatory Visit: Payer: Self-pay

## 2023-09-24 ENCOUNTER — Other Ambulatory Visit: Payer: Self-pay | Admitting: Pulmonary Disease

## 2023-09-26 ENCOUNTER — Other Ambulatory Visit: Payer: Self-pay | Admitting: Internal Medicine

## 2023-09-26 ENCOUNTER — Other Ambulatory Visit: Payer: Self-pay

## 2023-09-26 ENCOUNTER — Other Ambulatory Visit (HOSPITAL_COMMUNITY): Payer: Self-pay

## 2023-09-26 DIAGNOSIS — C9110 Chronic lymphocytic leukemia of B-cell type not having achieved remission: Secondary | ICD-10-CM

## 2023-09-26 MED ORDER — BRUKINSA 80 MG PO CAPS
80.0000 mg | ORAL_CAPSULE | Freq: Every day | ORAL | 1 refills | Status: DC
  Filled 2023-09-26 – 2023-09-29 (×2): qty 30, 30d supply, fill #0
  Filled 2023-11-01: qty 30, 30d supply, fill #1

## 2023-09-27 ENCOUNTER — Telehealth: Payer: Self-pay

## 2023-09-27 DIAGNOSIS — J449 Chronic obstructive pulmonary disease, unspecified: Secondary | ICD-10-CM

## 2023-09-27 NOTE — Telephone Encounter (Signed)
 Copied from CRM #[id number]. Topic: Clinical - Prescription Issue >> Sep 27, 2023 10:16 AM Russell PARAS wrote: Reason for CRM:   Pt is contacting clinic to see why she has not received her Breztri  refill, reporting she is almost out of the medication.   She receives her Breztri  refills through [facility] and typically gets the medication by mail.   Requests call back  CB#  [phone]

## 2023-09-29 ENCOUNTER — Other Ambulatory Visit: Payer: Self-pay

## 2023-09-29 ENCOUNTER — Other Ambulatory Visit (HOSPITAL_COMMUNITY): Payer: Self-pay

## 2023-09-29 NOTE — Progress Notes (Signed)
 Specialty Pharmacy Refill Coordination Note  Katherine [name] is a 83 y.o. female contacted today regarding refills of specialty medication(s) Zanubrutinib  (Brukinsa )   Patient requested Delivery   Delivery date: 10/06/23   Verified address: [address], [place], [place] [address]   Medication will be filled on 08.13.25.

## 2023-10-03 MED ORDER — BREZTRI AEROSPHERE 160-9-4.8 MCG/ACT IN AERO: 2.0000 | INHALATION_SPRAY | Freq: Two times a day (BID) | RESPIRATORY_TRACT | 11 refills | Status: DC

## 2023-10-03 NOTE — Telephone Encounter (Signed)
 Copied from CRM [id number]. Topic: Clinical - Medication Question >> Sep 30, 2023  4:04 PM Katherine [name] wrote: Reason for CRM: Patient is requesting her refill of  Breztri  - she has scheduled her follow up appointment on 9/22.

## 2023-10-03 NOTE — Telephone Encounter (Signed)
 Refill submitted to [facility] on [address] in [place].

## 2023-10-04 DIAGNOSIS — I872 Venous insufficiency (chronic) (peripheral): Secondary | ICD-10-CM | POA: Diagnosis not present

## 2023-10-04 DIAGNOSIS — D692 Other nonthrombocytopenic purpura: Secondary | ICD-10-CM | POA: Diagnosis not present

## 2023-10-07 ENCOUNTER — Encounter: Payer: Self-pay | Admitting: Emergency Medicine

## 2023-10-07 ENCOUNTER — Ambulatory Visit
Admission: EM | Admit: 2023-10-07 | Discharge: 2023-10-07 | Disposition: A | Discharge status: Home / Self Care | Attending: Emergency Medicine | Admitting: Emergency Medicine

## 2023-10-07 DIAGNOSIS — I10 Essential (primary) hypertension: Secondary | ICD-10-CM | POA: Diagnosis not present

## 2023-10-07 DIAGNOSIS — R7989 Other specified abnormal findings of blood chemistry: Secondary | ICD-10-CM | POA: Diagnosis not present

## 2023-10-07 DIAGNOSIS — R7309 Other abnormal glucose: Secondary | ICD-10-CM

## 2023-10-07 DIAGNOSIS — R9431 Abnormal electrocardiogram [ECG] [EKG]: Secondary | ICD-10-CM | POA: Diagnosis not present

## 2023-10-07 DIAGNOSIS — Z859 Personal history of malignant neoplasm, unspecified: Secondary | ICD-10-CM | POA: Diagnosis not present

## 2023-10-07 DIAGNOSIS — B348 Other viral infections of unspecified site: Secondary | ICD-10-CM | POA: Diagnosis not present

## 2023-10-07 DIAGNOSIS — R42 Dizziness and giddiness: Secondary | ICD-10-CM | POA: Diagnosis not present

## 2023-10-07 DIAGNOSIS — E785 Hyperlipidemia, unspecified: Secondary | ICD-10-CM | POA: Diagnosis not present

## 2023-10-07 DIAGNOSIS — R059 Cough, unspecified: Secondary | ICD-10-CM | POA: Diagnosis not present

## 2023-10-07 DIAGNOSIS — J449 Chronic obstructive pulmonary disease, unspecified: Secondary | ICD-10-CM | POA: Diagnosis not present

## 2023-10-07 LAB — GLUCOSE, CAPILLARY: Glucose-Capillary: 120 mg/dL — ABNORMAL HIGH (ref 70–99)

## 2023-10-07 NOTE — Discharge Instructions (Signed)
 Go to [facility] ER for further evaluation of dizziness,do not eat or drink anything until seen by provider.

## 2023-10-07 NOTE — ED Triage Notes (Addendum)
 Patient reports dizziness and feeling like her head is swimmy that started over a hour ago at home.  Patient denies headache.  Patient denies chest pain or SOB.  Patient denies nausea.  Patient did mention that her sister recently passed away.  Patient tearful during triage.

## 2023-10-07 NOTE — ED Provider Notes (Signed)
 [facility]    CSN: [id number] Arrival date & time: 10/07/23  1159      History   Chief Complaint Chief Complaint  Patient presents with   Dizziness    HPI Eithel Ryall is a 83 y.o. female.   83 year old female, Allaya Abbasi, presents to urgent care for evaluation of dizziness and feeling like her head is swimming ~ 1 hour ago. Pt denies headache, chest pain or SOB, denies nausea.   Pt is tearful and does report recent loss of her sister.  Daughter is present.  PMH: Diabetes, pneumonia, CLL(chemo), HTN  The history is provided by the patient. No language interpreter was used.    Past Medical History:  Diagnosis Date   Allergy    Anxiety    Depression    GERD (gastroesophageal reflux disease)    Hemorrhoids    Hypertension    Leukemia, lymphoid (HCC)    CLL   Lobar pneumonia (HCC)    Osteopenia    Personal history of chemotherapy     Patient Active Problem List   Diagnosis Date Noted   Dizziness 10/07/2023   Elevated hemoglobin A1c measurement 06/11/2023   Controlled substance agreement signed 12/09/2021   Type O blood, Rh negative 05/09/2021   Splenomegaly 02/02/2020   Thrombocytopenia (HCC) 02/02/2020   Allergic rhinitis 07/18/2018   Goals of care, counseling/discussion 07/05/2018   History of prior cigarette smoking 05/01/2018   Centrilobular emphysema (HCC) 02/05/2018   Advanced care planning/counseling discussion 01/09/2018   Anxiety 01/04/2017   Hypercholesterolemia 01/08/2016   Atherosclerosis of aorta (HCC) 01/07/2016   History of colonic polyps 08/14/2015   CLL (chronic lymphocytic leukemia) (HCC) 05/28/2015   Senile purpura (HCC) 01/06/2015   Essential hypertension 01/06/2015   Depression, recurrent (HCC) 01/06/2015    Past Surgical History:  Procedure Laterality Date   ABDOMINAL HYSTERECTOMY     APPENDECTOMY     BREAST BIOPSY Left    bx x 3-neg   COLON SURGERY     sigmoid resection   COLONOSCOPY     2007, 2012    COLONOSCOPY WITH PROPOFOL  N/A 09/17/2015   Procedure: COLONOSCOPY WITH PROPOFOL ;  Surgeon: Reyes LELON Cota, MD;  Location: [facility] ENDOSCOPY;  Service: Endoscopy;  Laterality: N/A;   OOPHORECTOMY     SPINE SURGERY     L4-5    OB History   No obstetric history on file.      Home Medications    Prior to Admission medications   Medication Sig Start Date End Date Taking? Authorizing Provider  acetaminophen  (TYLENOL ) 325 MG tablet Take by mouth. 03/13/21   [provider]  albuterol  (VENTOLIN  HFA) 108 (90 Base) MCG/ACT inhaler TAKE 2 PUFFS BY MOUTH EVERY 6 HOURS AS NEEDED FOR WHEEZE OR SHORTNESS OF BREATH 08/04/22   Tamea Dedra CROME, MD  atorvastatin  (LIPITOR) 10 MG tablet TAKE 1 TABLET BY MOUTH EVERY DAY 12/22/22   Cannady, Jolene T, NP  benazepril  (LOTENSIN ) 40 MG tablet TAKE 1 TABLET BY MOUTH EVERY DAY 08/01/23   Cannady, Jolene T, NP  budesonide-glycopyrrolate-formoterol  (BREZTRI  AEROSPHERE) 160-9-4.8 MCG/ACT AERO inhaler Inhale 2 puffs into the lungs in the morning and at bedtime. 10/03/23   Tamea Dedra CROME, MD  cholecalciferol (VITAMIN D3) 25 MCG (1000 UNIT) tablet Take 1,000 Units by mouth daily.    [provider]  Cyanocobalamin (VITAMIN B-12) 5000 MCG TBDP Take 1 tablet by mouth daily at 2 PM.    [provider]  diphenoxylate -atropine  (LOMOTIL ) 2.5-0.025  MG tablet Take 1 tablet by mouth 4 (four) times daily as needed for diarrhea or loose stools. Take it along with immodium 04/06/23   Brahmanday, Govinda R, MD  hydrocortisone  (ANUSOL -HC) 25 MG suppository Short term use as needed.  Home med. 11/22/22   Awanda City, MD  mometasone  (NASONEX ) 50 MCG/ACT nasal spray USE 1 SPRAY TO EACH NOSTRIL TWICE A DAY 09/26/23   Tamea Dedra CROME, MD  Multiple Vitamins-Minerals (MULTIVITAMIN WITH MINERALS) tablet Take 1 tablet by mouth daily.    [provider]  PARoxetine  (PAXIL ) 30 MG tablet TAKE 1 TABLET BY MOUTH EVERY DAY 08/30/23   Cannady, Jolene T, NP  zanubrutinib   (BRUKINSA ) 80 MG capsule Take 1 capsule (80 mg total) by mouth daily. 09/26/23   Rennie Cindy SAUNDERS, MD    Family History Family History  Problem Relation Age of Onset   Osteoporosis Mother    Hypertension Father    Heart attack Father    Stroke Maternal Grandfather    Breast cancer Sister 38    Social History Social History   Tobacco Use   Smoking status: Former    Current packs/day: 0.00    Average packs/day: 1 pack/day for 35.0 years (35.0 ttl pk-yrs)    Types: Cigarettes    Start date: 02/22/1986    Quit date: 02/22/2021    Years since quitting: 2.6   Smokeless tobacco: Never  Vaping Use   Vaping status: Never Used  Substance Use Topics   Alcohol use: No    Alcohol/week: 0.0 standard drinks of alcohol   Drug use: No     Allergies   Augmentin [amoxicillin-pot clavulanate] and Biaxin [clarithromycin]   Review of Systems Review of Systems  Constitutional:  Negative for fever.  Respiratory:  Negative for shortness of breath.   Cardiovascular:  Negative for chest pain and palpitations.  Neurological:  Positive for dizziness. Negative for headaches.       Feels swimmy, not right in head  All other systems reviewed and are negative.    Physical Exam Triage Vital Signs ED Triage Vitals  Encounter Vitals Group     BP      Girls Systolic BP Percentile      Girls Diastolic BP Percentile      Boys Systolic BP Percentile      Boys Diastolic BP Percentile      Pulse      Resp      Temp      Temp src      SpO2      Weight      Height      Head Circumference      Peak Flow      Pain Score      Pain Loc      Pain Education      Exclude from Growth Chart    No data found.  Updated Vital Signs BP (!) 164/81 (BP Location: Left Arm)   Pulse 66   Temp 98 F (36.7 C) (Oral)   Resp 14   Ht 5' 6 (1.676 m)   Wt 144 lb 6.4 oz (65.5 kg)   SpO2 95%   BMI 23.31 kg/m   Visual Acuity Right Eye Distance:   Left Eye Distance:   Bilateral Distance:    Right  Eye Near:   Left Eye Near:    Bilateral Near:     Physical Exam Vitals and nursing note reviewed.  Cardiovascular:     Rate and Rhythm:  Normal rate and regular rhythm.     Heart sounds: Normal heart sounds.  Pulmonary:     Effort: Pulmonary effort is normal.     Breath sounds: Normal breath sounds and air entry.  Neurological:     General: No focal deficit present.     Mental Status: She is alert and oriented to person, place, and time.     GCS: GCS eye subscore is 4. GCS verbal subscore is 5. GCS motor subscore is 6.  Psychiatric:        Attention and Perception: Attention normal.        Mood and Affect: Mood normal. Affect is tearful.        Speech: Speech normal.        Behavior: Behavior normal. Behavior is cooperative.      UC Treatments / Results  Labs (all labs ordered are listed, but only abnormal results are displayed) Labs Reviewed  GLUCOSE, CAPILLARY - Abnormal; Notable for the following components:      Result Value   Glucose-Capillary 120 (*)    All other components within normal limits  CBG MONITORING, ED    EKG   Radiology No results found.  Procedures Procedures (including critical care time)  Medications Ordered in UC Medications - No data to display  Initial Impression / Assessment and Plan / UC Course  I have reviewed the triage vital signs and the nursing notes.  Pertinent labs & imaging results that were available during my care of the patient were reviewed by me and considered in my medical decision making (see chart for details).  Clinical Course as of 10/07/23 2145  Fri Oct 07, 2023  1205 EKG and cbg ordered for dizzy/faint feeling [JD]  1218 EKG shows NSR rate 67, QTC is 460 As compared to last EKG(11/20/22) pt has t wave inversion V5/V6 [JD]  1303 Discussed exam findings and plan of care with patient and daughter, recommend further evaluation in the emergency room, daughter will drive patient POV to [facility] ER for further  evaluation [JD]    Clinical Course User Index [JD] Tynia Wiers, Rilla, NP    Ddx: Dizziness, EKG changes, hx of diabetes Final Clinical Impressions(s) / UC Diagnoses   Final diagnoses:  Elevated hemoglobin A1c measurement  Dizziness     Discharge Instructions      Go to [facility] ER for further evaluation of dizziness,do not eat or drink anything until seen by provider.     ED Prescriptions   None    PDMP not reviewed this encounter.   Aminta Rilla, NP 10/07/23 2146

## 2023-10-07 NOTE — ED Notes (Signed)
 Patient is being discharged from the Urgent Care and sent to the [facility] Emergency Department via private vehicle with daughter . Per Rilla Flood, NP, patient is in need of higher level of care due to Dizziness and changes on her EKG. Patient is aware and verbalizes understanding of plan of care.  Vitals:   10/07/23 1213  BP: (!) 164/81  Pulse: 66  Resp: 14  Temp: 98 F (36.7 C)  SpO2: 95%

## 2023-10-08 DIAGNOSIS — R42 Dizziness and giddiness: Secondary | ICD-10-CM | POA: Diagnosis not present

## 2023-10-10 ENCOUNTER — Telehealth: Payer: Self-pay

## 2023-10-10 NOTE — Telephone Encounter (Signed)
 Noted

## 2023-10-10 NOTE — Transitions of Care (Post Inpatient/ED Visit) (Signed)
 10/10/2023  Name: Katherine [name] MRN: [id number] DOB: Sep 28, 1940  Today's TOC FU Call Status: Today's TOC FU Call Status:: Successful TOC FU Call Completed TOC FU Call Complete Date: 10/10/23 Patient's Name and Date of Birth confirmed.  Transition Care Management Follow-up Telephone Call Date of Discharge: 10/08/23 Discharge Facility: Other [facility]) Name of Other ([facility]) Discharge Facility: [facility] Type of Discharge: Inpatient Admission Primary Inpatient Discharge Diagnosis:: Dizziness How have you been since you were released from the hospital?: Better Any questions or concerns?: No  Items Reviewed: Did you receive and understand the discharge instructions provided?: Yes Medications obtained,verified, and reconciled?: Yes (Medications Reviewed) Any new allergies since your discharge?: No Dietary orders reviewed?: NA Do you have support at home?: Yes People in Home [RPT]: spouse  Medications Reviewed Today: Medications Reviewed Today     Reviewed by Lavelle Charmaine NOVAK, LPN (Licensed Practical Nurse) on 10/10/23 at 1554  Med List Status: <None>   Medication Order Taking? Sig Documenting Provider Last Dose Status Informant  acetaminophen  (TYLENOL ) 325 MG tablet [id number]  Take by mouth. [provider]  Active Pharmacy Records, Other  albuterol  (VENTOLIN  HFA) 108 (90 Base) MCG/ACT inhaler [id number]  TAKE 2 PUFFS BY MOUTH EVERY 6 HOURS AS NEEDED FOR WHEEZE OR SHORTNESS OF BREATH Tamea Dedra CROME, MD  Active Pharmacy Records, Other  atorvastatin  (LIPITOR) 10 MG tablet [id number]  TAKE 1 TABLET BY MOUTH EVERY DAY Cannady, Jolene T, NP  Active   benazepril  (LOTENSIN ) 40 MG tablet [id number]  TAKE 1 TABLET BY MOUTH EVERY DAY Cannady, Jolene T, NP  Active   budesonide-glycopyrrolate-formoterol  (BREZTRI  AEROSPHERE) 160-9-4.8 MCG/ACT AERO inhaler [id number]  Inhale 2 puffs into the lungs in the morning and at bedtime. Tamea Dedra CROME, MD  Active    cholecalciferol (VITAMIN D3) 25 MCG (1000 UNIT) tablet [id number]  Take 1,000 Units by mouth daily. [provider]  Active   Cyanocobalamin (VITAMIN B-12) 5000 MCG TBDP [id number]  Take 1 tablet by mouth daily at 2 PM. [provider]  Active   diphenoxylate -atropine  (LOMOTIL ) 2.5-0.025 MG tablet [id number]  Take 1 tablet by mouth 4 (four) times daily as needed for diarrhea or loose stools. Take it along with immodium Brahmanday, Govinda R, MD  Active   hydrocortisone  (ANUSOL -HC) 25 MG suppository [id number]  Short term use as needed.  Home med. Awanda City, MD  Active   mometasone  (NASONEX ) 50 MCG/ACT nasal spray [id number]  USE 1 SPRAY TO EACH NOSTRIL TWICE A DAY Tamea Dedra CROME, MD  Active   Multiple Vitamins-Minerals (MULTIVITAMIN WITH MINERALS) tablet [id number]  Take 1 tablet by mouth daily. [provider]  Active Pharmacy Records, Other  PARoxetine  (PAXIL ) 30 MG tablet [id number]  TAKE 1 TABLET BY MOUTH EVERY DAY Cannady, Jolene T, NP  Active   zanubrutinib  (BRUKINSA ) 80 MG capsule [id number]  Take 1 capsule (80 mg total) by mouth daily. Rennie Cindy SAUNDERS, MD  Active   Med List Note Teretha Renaee SAILOR, RPH-CPP 12/01/22 1228): Brukinsa  filled at [facility]            Home Care and Equipment/Supplies: Were Home Health Services Ordered?: NA Any new equipment or medical supplies ordered?: NA  Functional Questionnaire: Do you need assistance with bathing/showering or dressing?: No Do you need assistance with meal preparation?: No Do you need assistance with eating?: No Do you have difficulty maintaining continence: No Do you need assistance with getting out of bed/getting out of a chair/moving?: No Do you have difficulty  managing or taking your medications?: No  Follow up appointments reviewed: PCP Follow-up appointment confirmed?: No (pt declined to schedule)    SIGNATURE Charmaine Bloodgood, LPN [facility] Advisor [place] [facility] You Are. We Are. [facility] Direct Dial [phone]

## 2023-10-12 DIAGNOSIS — R7989 Other specified abnormal findings of blood chemistry: Secondary | ICD-10-CM | POA: Diagnosis not present

## 2023-10-12 DIAGNOSIS — I083 Combined rheumatic disorders of mitral, aortic and tricuspid valves: Secondary | ICD-10-CM | POA: Diagnosis not present

## 2023-10-12 DIAGNOSIS — R9431 Abnormal electrocardiogram [ECG] [EKG]: Secondary | ICD-10-CM | POA: Diagnosis not present

## 2023-10-12 DIAGNOSIS — R931 Abnormal findings on diagnostic imaging of heart and coronary circulation: Secondary | ICD-10-CM | POA: Diagnosis not present

## 2023-10-15 NOTE — Patient Instructions (Incomplete)

## 2023-10-17 ENCOUNTER — Ambulatory Visit (INDEPENDENT_AMBULATORY_CARE_PROVIDER_SITE_OTHER): Admitting: Nurse Practitioner

## 2023-10-17 ENCOUNTER — Encounter: Payer: Self-pay | Admitting: Nurse Practitioner

## 2023-10-17 VITALS — BP 97/64 | HR 75 | Temp 98.4°F | Ht 66.0 in | Wt 145.8 lb

## 2023-10-17 DIAGNOSIS — D696 Thrombocytopenia, unspecified: Secondary | ICD-10-CM

## 2023-10-17 DIAGNOSIS — D692 Other nonthrombocytopenic purpura: Secondary | ICD-10-CM | POA: Diagnosis not present

## 2023-10-17 DIAGNOSIS — F339 Major depressive disorder, recurrent, unspecified: Secondary | ICD-10-CM

## 2023-10-17 DIAGNOSIS — C9110 Chronic lymphocytic leukemia of B-cell type not having achieved remission: Secondary | ICD-10-CM | POA: Diagnosis not present

## 2023-10-17 DIAGNOSIS — F419 Anxiety disorder, unspecified: Secondary | ICD-10-CM

## 2023-10-17 DIAGNOSIS — J432 Centrilobular emphysema: Secondary | ICD-10-CM | POA: Diagnosis not present

## 2023-10-17 DIAGNOSIS — R9431 Abnormal electrocardiogram [ECG] [EKG]: Secondary | ICD-10-CM

## 2023-10-17 DIAGNOSIS — E7800 Pure hypercholesterolemia, unspecified: Secondary | ICD-10-CM

## 2023-10-17 DIAGNOSIS — R7309 Other abnormal glucose: Secondary | ICD-10-CM

## 2023-10-17 DIAGNOSIS — I10 Essential (primary) hypertension: Secondary | ICD-10-CM

## 2023-10-17 MED ORDER — FLUTICASONE PROPIONATE 50 MCG/ACT NA SUSP: 2.0000 | Freq: Every day | NASAL | 6 refills | Status: AC

## 2023-10-17 MED ORDER — LEVOCETIRIZINE DIHYDROCHLORIDE 5 MG PO TABS: 5.0000 mg | ORAL_TABLET | Freq: Every evening | ORAL | 12 refills | Status: DC

## 2023-10-17 NOTE — Progress Notes (Signed)
 BP 97/64   Pulse 75   Temp 98.4 F (36.9 C) (Oral)   Ht 5' 6 (1.676 m)   Wt 145 lb 12.8 oz (66.1 kg)   SpO2 97%   BMI 23.53 kg/m    Subjective:    Patient ID: Katherine [name], female    DOB: [birth year], 83 y.o.   MRN: [id number]  HPI: Katherine [name] is a 83 y.o. female  Chief Complaint  Patient presents with   Dizziness    Patient states she was seen at [facility] last week for dizziness. States she had imaging done (in care everywhere) and was advised to follow up with us .    Allergies    Patient states that she would like to have something for her allergies sent in   DIZZINESS Presents today for dizziness follow-up, she reports this is better.  Was seen at [facility] on 10/07/23 for this.  Had CT scan of head, overall not acute findings. CXR was reassuring and echo noted EF 60-65%. EKG she reports being told showed that she may have had a MI at some time or another and they recommended she see cardiology + her troponin levels were elevated. A1c in April was 6%. She reports dizziness was for one episode and no further. Platelets in ER were 88, but she has a large bruise on lower right leg from compression hose recently.  No pain.    Currently under treatment for cancer, CLL -- WBC in ER at [facility] was elevated.  COPD, follows with pulmonary and last saw 12/29/22.  Started Breztri , gets via assistance program with pulmonary.  Needs something new for allergies, as she has now stopped Benadryl  as recommended. Duration: days Description of symptoms: lightheaded Duration of episode: seconds Dizziness frequency: no history of the same Provoking factors: none Aggravating factors:  none Triggered by rolling over in bed: no Triggered by bending over: no Aggravated by head movement: no Aggravated by exertion, coughing, loud noises: no Recent head injury: no Recent or current viral symptoms: no History of vasovagal episodes: no Nausea: no Vomiting: no Tinnitus: yes at baseline to  right ear Hearing loss: no Aural fullness: no Headache: no Photophobia/phonophobia: no Unsteady gait: no Postural instability: no Diplopia, dysarthria, dysphagia or weakness: no Related to exertion: no Pallor: no Diaphoresis: no Dyspnea: no Chest pain: no     10/17/2023   10:38 AM 06/13/2023    9:06 AM 12/10/2022   10:14 AM 06/10/2022   11:51 AM 12/09/2021    1:11 PM  Depression screen PHQ 2/9  Decreased Interest 0 0 0 0 0  Down, Depressed, Hopeless 0 0 0 0 0  PHQ - 2 Score 0 0 0 0 0  Altered sleeping 0 0 0 0 0  Tired, decreased energy 0 0 0 0 0  Change in appetite 0 0 0 0 0  Feeling bad or failure about yourself  0 0 0 0 0  Trouble concentrating 0 0 0 0 0  Moving slowly or fidgety/restless 0 0 0 0 0  Suicidal thoughts 0 0 0 0 0  PHQ-9 Score 0 0 0 0 0  Difficult doing work/chores Not difficult at all Not difficult at all Not difficult at all  Not difficult at all       10/17/2023   10:39 AM 06/13/2023    9:07 AM 12/10/2022   10:14 AM 06/10/2022   11:51 AM  GAD 7 : Generalized Anxiety Score  Nervous, Anxious, on Edge 0 0  0 1  Control/stop worrying 0 0 0 1  Worry too much - different things 0 0 0 0  Trouble relaxing 0 0 0 1  Restless 0 0 0 0  Easily annoyed or irritable 0 0 0 0  Afraid - awful might happen 0 0 0 0  Total GAD 7 Score 0 0 0 3  Anxiety Difficulty Not difficult at all Not difficult at all Not difficult at all Not difficult at all   Relevant past medical, surgical, family and social history reviewed and updated as indicated. Interim medical history since our last visit reviewed. Allergies and medications reviewed and updated.  Review of Systems  Constitutional:  Negative for activity change, appetite change, diaphoresis, fatigue and fever.  Respiratory:  Negative for cough, chest tightness and shortness of breath.   Cardiovascular:  Negative for chest pain, palpitations and leg swelling.  Gastrointestinal: Negative.   Neurological:  Positive for  dizziness (improved). Negative for syncope, weakness, light-headedness, numbness and headaches.  Psychiatric/Behavioral: Negative.      Per HPI unless specifically indicated above     Objective:    BP 97/64   Pulse 75   Temp 98.4 F (36.9 C) (Oral)   Ht 5' 6 (1.676 m)   Wt 145 lb 12.8 oz (66.1 kg)   SpO2 97%   BMI 23.53 kg/m   Wt Readings from Last 3 Encounters:  10/17/23 145 lb 12.8 oz (66.1 kg)  10/07/23 144 lb 6.4 oz (65.5 kg)  08/10/23 144 lb 11.2 oz (65.6 kg)    Physical Exam Vitals and nursing note reviewed.  Constitutional:      General: She is awake. She is not in acute distress.    Appearance: She is well-developed and well-groomed. She is not ill-appearing or toxic-appearing.  HENT:     Head: Normocephalic.     Right Ear: Hearing and external ear normal.     Left Ear: Hearing and external ear normal.  Eyes:     General: Lids are normal.        Right eye: No discharge.        Left eye: No discharge.     Conjunctiva/sclera: Conjunctivae normal.     Pupils: Pupils are equal, round, and reactive to light.  Neck:     Thyroid : No thyromegaly.     Vascular: No carotid bruit.  Cardiovascular:     Rate and Rhythm: Normal rate and regular rhythm.     Heart sounds: Normal heart sounds. No murmur heard.    No gallop.  Pulmonary:     Effort: Pulmonary effort is normal. No accessory muscle usage or respiratory distress.     Breath sounds: Normal breath sounds.  Abdominal:     General: Bowel sounds are normal. There is no distension.     Palpations: Abdomen is soft.     Tenderness: There is no abdominal tenderness.  Musculoskeletal:     Cervical back: Normal range of motion and neck supple.     Right lower leg: No edema.     Left lower leg: No edema.  Lymphadenopathy:     Cervical: No cervical adenopathy.  Skin:    General: Skin is warm and dry.     Findings: Bruising present.     Comments: Scattered bruises bilateral upper extremities and large bruise to  right shin.  No pain to area.  Neurological:     Mental Status: She is alert and oriented to person, place, and time.  Cranial Nerves: Cranial nerves 2-12 are intact.     Motor: Motor function is intact.     Coordination: Coordination is intact.     Gait: Gait is intact.     Deep Tendon Reflexes: Reflexes are normal and symmetric.     Reflex Scores:      Brachioradialis reflexes are 2+ on the right side and 2+ on the left side.      Patellar reflexes are 2+ on the right side and 2+ on the left side. Psychiatric:        Attention and Perception: Attention normal.        Mood and Affect: Mood normal.        Speech: Speech normal.        Behavior: Behavior normal. Behavior is cooperative.        Thought Content: Thought content normal.     Results for orders placed or performed during the hospital encounter of 10/07/23  Glucose, capillary   Collection Time: 10/07/23 12:22 PM  Result Value Ref Range   Glucose-Capillary 120 (H) 70 - 99 mg/dL      Assessment & Plan:   Problem List Items Addressed This Visit       Cardiovascular and Mediastinum   Senile purpura (HCC)   Chronic.  Noted on exam, recommend gentle skin care and monitoring for wounds, if wounds present immediately alert provider.        Respiratory   Centrilobular emphysema (HCC)   Chronic, stable.  Continue current medication regimen as ordered by pulmonary + continue use of InCourage vest.   Recommend she avoid use of Benadryl  due to age >76 and BEERS criteria, utilize Xyzal  instead (sent this in for allergies) and Flonase .  Currently stable with pulmonary regimen.        Relevant Medications   levocetirizine (XYZAL ) 5 MG tablet   fluticasone  (FLONASE ) 50 MCG/ACT nasal spray     Hematopoietic and Hemostatic   Thrombocytopenia (HCC)   With underlying CLL, followed by oncology.  Continue this collaboration. Recent labs and notes reviewed. Recent PLT 88 and she is having more bruising, will recheck today and  send secure chat to oncology to alert them.  Advised her to also reach out to them.      Relevant Orders   CBC with Differential/Platelet   Basic metabolic panel with GFR     Other   CLL (chronic lymphocytic leukemia) (HCC) - Primary   Chronic, under treatment with oncology.  Recent note and labs reviewed.  Continue collaboration with oncology provider.  Discussed goals of care with patient.      Relevant Medications   levocetirizine (XYZAL ) 5 MG tablet   Other Relevant Orders   Ambulatory referral to Cardiology   Other Visit Diagnoses       Abnormal EKG       Referral to cardiology per recommendation of [facility].   Relevant Orders   Ambulatory referral to Cardiology        Follow up plan: Return for as scheduled in October.

## 2023-10-17 NOTE — Assessment & Plan Note (Signed)
 With underlying CLL, followed by oncology.  Continue this collaboration. Recent labs and notes reviewed. Recent PLT 88 and she is having more bruising, will recheck today and send secure chat to oncology to alert them.  Advised her to also reach out to them.

## 2023-10-17 NOTE — Assessment & Plan Note (Signed)
Chronic, under treatment with oncology.  Recent note and labs reviewed.  Continue collaboration with oncology provider.  Discussed goals of care with patient.

## 2023-10-17 NOTE — Assessment & Plan Note (Signed)
 Chronic, stable.  Continue current medication regimen as ordered by pulmonary + continue use of InCourage vest.   Recommend she avoid use of Benadryl  due to age >43 and BEERS criteria, utilize Xyzal  instead (sent this in for allergies) and Flonase .  Currently stable with pulmonary regimen.

## 2023-10-17 NOTE — Assessment & Plan Note (Signed)
Chronic.  Noted on exam, recommend gentle skin care and monitoring for wounds, if wounds present immediately alert provider.

## 2023-10-18 ENCOUNTER — Ambulatory Visit: Payer: Self-pay | Admitting: Nurse Practitioner

## 2023-10-18 LAB — CBC WITH DIFFERENTIAL/PLATELET
Basophils Absolute: 0.1 x10E3/uL (ref 0.0–0.2)
Basos: 0 %
EOS (ABSOLUTE): 0.1 x10E3/uL (ref 0.0–0.4)
Eos: 0 %
Hematocrit: 43.1 % (ref 34.0–46.6)
Hemoglobin: 13.6 g/dL (ref 11.1–15.9)
Immature Grans (Abs): 0 x10E3/uL (ref 0.0–0.1)
Immature Granulocytes: 0 %
Lymphocytes Absolute: 38.4 x10E3/uL — ABNORMAL HIGH (ref 0.7–3.1)
Lymphs: 93 %
MCH: 32.4 pg (ref 26.6–33.0)
MCHC: 31.6 g/dL (ref 31.5–35.7)
MCV: 103 fL — ABNORMAL HIGH (ref 79–97)
Monocytes Absolute: 0.6 x10E3/uL (ref 0.1–0.9)
Monocytes: 2 %
Neutrophils Absolute: 2.1 x10E3/uL (ref 1.4–7.0)
Neutrophils: 5 %
Platelets: 125 x10E3/uL — ABNORMAL LOW (ref 150–450)
RBC: 4.2 x10E6/uL (ref 3.77–5.28)
RDW: 12 % (ref 11.7–15.4)
WBC: 41.4 x10E3/uL (ref 3.4–10.8)

## 2023-10-18 LAB — BASIC METABOLIC PANEL WITH GFR
BUN/Creatinine Ratio: 13 (ref 12–28)
BUN: 13 mg/dL (ref 8–27)
CO2: 24 mmol/L (ref 20–29)
Calcium: 9.7 mg/dL (ref 8.7–10.3)
Chloride: 103 mmol/L (ref 96–106)
Creatinine, Ser: 1.01 mg/dL — ABNORMAL HIGH (ref 0.57–1.00)
Glucose: 86 mg/dL (ref 70–99)
Potassium: 4.2 mmol/L (ref 3.5–5.2)
Sodium: 143 mmol/L (ref 134–144)
eGFR: 56 mL/min/1.73 — ABNORMAL LOW (ref >59)

## 2023-10-18 NOTE — Progress Notes (Signed)
 Contacted via MyChart  Good afternoon Cheynne, your labs have returned.  WBC continues to be elevated, but lower than past checks with treatment on board.  Platelets are improved this check, but still low.  Monitor bruising and if worsening alert me or oncology ASAP. - Kidney function, creatinine and eGFR, shows mild Stage 3a kidney disease. Ensure good water intake daily.  Any questions? Keep being amazing!!  Thank you for allowing me to participate in your care.  I appreciate you. Kindest regards, Jaimi Belle

## 2023-10-19 DIAGNOSIS — R7989 Other specified abnormal findings of blood chemistry: Secondary | ICD-10-CM | POA: Diagnosis not present

## 2023-11-01 ENCOUNTER — Other Ambulatory Visit: Payer: Self-pay

## 2023-11-01 ENCOUNTER — Other Ambulatory Visit: Payer: Self-pay | Admitting: Pharmacy Technician

## 2023-11-01 NOTE — Progress Notes (Signed)
 Specialty Pharmacy Refill Coordination Note  Katherine [name] is a 83 y.o. female contacted today regarding refills of specialty medication(s) Zanubrutinib  (Brukinsa )   Patient requested Delivery   Delivery date: 11/04/23   Verified address: [address] [place]; [employer]   Medication will be filled on 11/03/23.

## 2023-11-02 ENCOUNTER — Other Ambulatory Visit: Payer: Self-pay

## 2023-11-07 DIAGNOSIS — R7989 Other specified abnormal findings of blood chemistry: Secondary | ICD-10-CM | POA: Diagnosis not present

## 2023-11-09 ENCOUNTER — Inpatient Hospital Stay: Attending: Internal Medicine

## 2023-11-09 ENCOUNTER — Inpatient Hospital Stay (HOSPITAL_BASED_OUTPATIENT_CLINIC_OR_DEPARTMENT_OTHER): Admitting: Internal Medicine

## 2023-11-09 ENCOUNTER — Encounter: Payer: Self-pay | Admitting: Internal Medicine

## 2023-11-09 DIAGNOSIS — C9110 Chronic lymphocytic leukemia of B-cell type not having achieved remission: Secondary | ICD-10-CM | POA: Insufficient documentation

## 2023-11-09 DIAGNOSIS — M7989 Other specified soft tissue disorders: Secondary | ICD-10-CM | POA: Insufficient documentation

## 2023-11-09 DIAGNOSIS — R252 Cramp and spasm: Secondary | ICD-10-CM | POA: Insufficient documentation

## 2023-11-09 DIAGNOSIS — J449 Chronic obstructive pulmonary disease, unspecified: Secondary | ICD-10-CM | POA: Insufficient documentation

## 2023-11-09 DIAGNOSIS — G4700 Insomnia, unspecified: Secondary | ICD-10-CM | POA: Insufficient documentation

## 2023-11-09 DIAGNOSIS — Z8701 Personal history of pneumonia (recurrent): Secondary | ICD-10-CM | POA: Diagnosis not present

## 2023-11-09 DIAGNOSIS — Z87891 Personal history of nicotine dependence: Secondary | ICD-10-CM | POA: Diagnosis not present

## 2023-11-09 DIAGNOSIS — Z803 Family history of malignant neoplasm of breast: Secondary | ICD-10-CM | POA: Diagnosis not present

## 2023-11-09 LAB — CMP (CANCER CENTER ONLY)
ALT: 13 U/L (ref 0–44)
AST: 23 U/L (ref 15–41)
Albumin: 4.4 g/dL (ref 3.5–5.0)
Alkaline Phosphatase: 77 U/L (ref 38–126)
Anion gap: 9 (ref 5–15)
BUN: 11 mg/dL (ref 8–23)
CO2: 26 mmol/L (ref 22–32)
Calcium: 9.5 mg/dL (ref 8.9–10.3)
Chloride: 104 mmol/L (ref 98–111)
Creatinine: 1.03 mg/dL — ABNORMAL HIGH (ref 0.44–1.00)
GFR, Estimated: 54 mL/min — ABNORMAL LOW (ref >60)
Glucose, Bld: 103 mg/dL — ABNORMAL HIGH (ref 70–99)
Potassium: 4.5 mmol/L (ref 3.5–5.1)
Sodium: 139 mmol/L (ref 135–145)
Total Bilirubin: 1.5 mg/dL — ABNORMAL HIGH (ref 0.0–1.2)
Total Protein: 6.6 g/dL (ref 6.5–8.1)

## 2023-11-09 LAB — CBC WITH DIFFERENTIAL (CANCER CENTER ONLY)
Abs Immature Granulocytes: 0.06 K/uL (ref 0.00–0.07)
Basophils Absolute: 0.2 K/uL — ABNORMAL HIGH (ref 0.0–0.1)
Basophils Relative: 1 %
Eosinophils Absolute: 0.1 K/uL (ref 0.0–0.5)
Eosinophils Relative: 0 %
HCT: 41.1 % (ref 36.0–46.0)
Hemoglobin: 13.5 g/dL (ref 12.0–15.0)
Immature Granulocytes: 0 %
Lymphocytes Relative: 93 %
Lymphs Abs: 30 K/uL — ABNORMAL HIGH (ref 0.7–4.0)
MCH: 32.4 pg (ref 26.0–34.0)
MCHC: 32.8 g/dL (ref 30.0–36.0)
MCV: 98.6 fL (ref 80.0–100.0)
Monocytes Absolute: 0.5 K/uL (ref 0.1–1.0)
Monocytes Relative: 1 %
Neutro Abs: 1.8 K/uL (ref 1.7–7.7)
Neutrophils Relative %: 5 %
Platelet Count: 123 K/uL — ABNORMAL LOW (ref 150–400)
RBC: 4.17 MIL/uL (ref 3.87–5.11)
RDW: 13 % (ref 11.5–15.5)
Smear Review: NORMAL
WBC Count: 32.5 K/uL — ABNORMAL HIGH (ref 4.0–10.5)
nRBC: 0 % (ref 0.0–0.2)

## 2023-11-09 LAB — LACTATE DEHYDROGENASE: LDH: 164 U/L (ref 98–192)

## 2023-11-09 NOTE — Progress Notes (Signed)
 Went to [facility] ED for dizziness 10/08/23.

## 2023-11-09 NOTE — Assessment & Plan Note (Addendum)
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 # AUG 21st, 2024- CT chest [Dr.Gonzalez]- Partially visualized splenomegaly, increased-again suggestive of progressive CLL.  Spleen up to umbilicus. Currently on Zanubrutinib  80 mg In AM [for last 3 weeks- SEP mid 2024].   # Patient tolerating Zanu 80 mg a day fairly well-monitor closely in the context of recent GI bleed see below.  White count 32 -improving thrombocytopenia improved- Stable.   # Anemia multifactorial CLL/GI bleed.  No hemolysis. Continue  gentle iron [iron biglycinate; 28 mg ] 1 pill a day-today hemoglobin is 13 improved from a baseline of 8. Stable.   # Bil Leg cramps/insomnia- Magnesium L-threonate is often used to improve sleep quality- stable.   # Bil LE swelling: sec to Zanubrutinib - G-1-2;continue Stocking/ leg elevation-  Stable  # Diarrhea grade 1 secondary to zabu continue in addition to Imodium recommend Lomotil . 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   Patient declines given concerns of diarrhea./Poor tolerance. Stable  #COPD/history of pneumonia -bilateral Jan 2023 [QUITsmoking-April, 2023; [facility]]-right middle lobe lung atelectasis [Dr.Gonzalez] - AUG 2024- CT- Improved aeration of the right middle lobe with residual mild plate like scarring versus atelectasis in the right middle lobe. Stable  # Easy brusing sec to zanurbitinib-stable.   # Vaccination:s/p flu shot; ok with  covid for now. Recommend shingles vaccination [remote] -   # DISPOSITION:  # Follow up in 4  months- MD: Labs- cbc/cmp; LDH;quantitive immunoglobulin- Dr.B

## 2023-11-09 NOTE — Progress Notes (Signed)
 [facility] CONSULT NOTE  Patient Care Team: Valerio Melanie DASEN, NP as PCP - General (Nurse Practitioner) Isenstein, Arin L, MD (Dermatology) Viktoria Lamar DASEN, MD (Inactive) (Gastroenterology) Dessa Reyes ORN, MD (General Surgery) Rennie Cindy SAUNDERS, MD as Consulting Physician (Oncology) Tamea Dedra CROME, MD as Consulting Physician (Pulmonary Disease)  CHIEF COMPLAINTS/PURPOSE OF CONSULTATION:  CLL  #  Oncology History Overview Note  Katherine [name] is a 83 y.o. female with chronic lymphocytic leukemia (CLL).  WBC has ranged between 22,000 - 101,7000 since 05/2011.   She has received Rituxan  x 2 four week cycles (06/27/2015 and 05/27/2017).  Hepatitis B surface antigen and hepatitis B surface antibody were negative on 07/04/2015.   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  CCND1, ATM, chromosome 12, and TP53 were normal.     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  There was a CD5 and CD23 positive monoclonal B cell population with lambda light chain restriction, negative for FMC7 and CD38, representing  88% of leukocytes, >5,000/uL. There was no loss of, or aberrant expression of, the pan T cell  antigens to  suggest a neoplastic T cell process. CD4:CD8 ratio 2.0  No circulating blasts were detected. There was no immunophenotypic evidence of abnormal myeloid maturation.  IGH/BCL2 by FISH is pending. --------------------------------------------------------  She began ibrutinib  on 04/24/2019 (discontinued on 05/30/2019).                     She developed blood-streaked sputum (slight) then significant hematuria.                     She declined further ibrutinib . ---------------------------------------------------------------------------------            She began acalabrutinib  on 03/14/2020 (held on 05/13/2020 secondary  to diarrhea). --------------------------------------------------------------------------------------   She has a 35 pack year smoking history.  She is in the low dose chest CT program.  Low dose chest CT on 02/02/2018 revealed a new endobronchial lesion in the segmental bronchus to the medial segment of the right middle lobe. This may simply reflect an area of retained secretions, however, the possibility of an endobronchial neoplasm should be considered. Lung-RADS 4AS, suspicious.  Low dose chest CT on 07/05/2018 revealed Lung-RADS 2, benign appearance or behavior.  Prior right middle lobe endobronchial lesion/mucous plugging was no longer visualized.  New mucous plugging in the right lower lobe.  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  Accompanied by her  daughter.   Katherine [name] 83 y.o.  female CLL-13 q. Deletion currently on Brukinsa /Zanubrutinib  [started in sep 2024 ]; COPD- SCC of skin-is here for follow-up.  Went to [facility] ED for dizziness 10/08/23- work negative for any acute process.   Improved on magnesium. Continues to have ankles are swelling.  Otherwise she admits to compliance with her zanu pill.  Compliance with her iron pill. Continues to chronic fatigue.Otherwise denies any blood in stools or black-colored stools. No recent infections.   Review of Systems  Constitutional:  Positive for malaise/fatigue. Negative for chills, diaphoresis, fever and weight loss.  HENT:  Negative for nosebleeds and sore throat.  Eyes:  Negative for double vision.  Respiratory:  Positive for shortness of breath. Negative for cough, sputum production and wheezing.   Cardiovascular:  Negative for chest pain,  palpitations, orthopnea and leg swelling.  Gastrointestinal:  Negative for abdominal pain, blood in stool, constipation, diarrhea, heartburn, melena, nausea and vomiting.  Genitourinary:  Negative for dysuria, frequency and urgency.  Musculoskeletal:  Positive for back pain. Negative for joint pain.  Skin: Negative.  Negative for itching and rash.  Neurological:  Negative for dizziness, tingling, focal weakness, weakness and headaches.  Endo/Heme/Allergies:  Does not bruise/bleed easily.  Psychiatric/Behavioral:  Negative for depression. The patient is not nervous/anxious and does not have insomnia.      MEDICAL HISTORY:  Past Medical History:  Diagnosis Date   Allergy    Anxiety    Depression    GERD (gastroesophageal reflux disease)    Hemorrhoids    Hypertension    Leukemia, lymphoid (HCC)    CLL   Lobar pneumonia (HCC)    Osteopenia    Personal history of chemotherapy     SURGICAL HISTORY: Past Surgical History:  Procedure Laterality Date   ABDOMINAL HYSTERECTOMY     APPENDECTOMY     BREAST BIOPSY Left    bx x 3-neg   COLON SURGERY     sigmoid resection   COLONOSCOPY     2007, 2012   COLONOSCOPY WITH PROPOFOL  N/A 09/17/2015   Procedure: COLONOSCOPY WITH PROPOFOL ;  Surgeon: Reyes LELON Cota, MD;  Location: [facility] ENDOSCOPY;  Service: Endoscopy;  Laterality: N/A;   OOPHORECTOMY     SPINE SURGERY     L4-5    SOCIAL HISTORY: Social History   Socioeconomic History   Marital status: Married    Spouse name: Not on file   Number of children: Not on file   Years of education: 12   Highest education level: 12th grade  Occupational History   Not on file  Tobacco Use   Smoking status: Former    Current packs/day: 0.00    Average packs/day: 1 pack/day for 35.0 years (35.0 ttl pk-yrs)    Types: Cigarettes    Start date: 02/22/1986    Quit date: 02/22/2021    Years since quitting: 2.7   Smokeless tobacco: Never  Vaping Use   Vaping status: Never Used  Substance and  Sexual Activity   Alcohol use: No    Alcohol/week: 0.0 standard drinks of alcohol   Drug use: No   Sexual activity: Not on file  Other Topics Concern   Not on file  Social History Narrative   Not on file   Social Drivers of Health   Financial Resource Strain: Low Risk  (06/10/2022)   Overall Financial Resource Strain (CARDIA)    Difficulty of Paying Living Expenses: Not hard at all  Food Insecurity: No Food Insecurity (11/20/2022)   Hunger Vital Sign    Worried About Running Out of Food in the Last Year: Never true    Ran Out of Food in the Last Year: Never true  Transportation Needs: No Transportation Needs (11/20/2022)   PRAPARE - Administrator, Civil Service (Medical): No    Lack of Transportation (Non-Medical): No  Physical Activity: Insufficiently Active (06/10/2022)   Exercise Vital Sign    Days of Exercise per Week: 2 days    Minutes of Exercise per Session: 20 min  Stress: No Stress Concern Present (06/10/2022)   [employer] of Occupational Health - Occupational Stress Questionnaire  Feeling of Stress : Only a little  Social Connections: Moderately Integrated (06/10/2022)   Social Connection and Isolation Panel    Frequency of Communication with Friends and Family: More than three times a week    Frequency of Social Gatherings with Friends and Family: More than three times a week    Attends Religious Services: More than 4 times per year    Active Member of [employer] or Organizations: No    Attends Banker Meetings: Never    Marital Status: Married  Catering manager Violence: Not At Risk (11/20/2022)   Humiliation, Afraid, Rape, and Kick questionnaire    Fear of Current or Ex-Partner: No    Emotionally Abused: No    Physically Abused: No    Sexually Abused: No    FAMILY HISTORY: Family History  Problem Relation Age of Onset   Osteoporosis Mother    Hypertension Father    Heart attack Father    Stroke Maternal Grandfather    Breast  cancer Sister 74    ALLERGIES:  is allergic to augmentin [amoxicillin-pot clavulanate] and biaxin [clarithromycin].  MEDICATIONS:  Current Outpatient Medications  Medication Sig Dispense Refill   acetaminophen  (TYLENOL ) 325 MG tablet Take by mouth.     albuterol  (VENTOLIN  HFA) 108 (90 Base) MCG/ACT inhaler TAKE 2 PUFFS BY MOUTH EVERY 6 HOURS AS NEEDED FOR WHEEZE OR SHORTNESS OF BREATH 18 each 59   atorvastatin  (LIPITOR) 10 MG tablet TAKE 1 TABLET BY MOUTH EVERY DAY 90 tablet 3   benazepril  (LOTENSIN ) 40 MG tablet TAKE 1 TABLET BY MOUTH EVERY DAY 90 tablet 1   budesonide-glycopyrrolate-formoterol  (BREZTRI  AEROSPHERE) 160-9-4.8 MCG/ACT AERO inhaler Inhale 2 puffs into the lungs in the morning and at bedtime. 10.7 each 11   cholecalciferol (VITAMIN D3) 25 MCG (1000 UNIT) tablet Take 1,000 Units by mouth daily.     Cyanocobalamin (VITAMIN B-12) 5000 MCG TBDP Take 1 tablet by mouth daily at 2 PM.     diphenoxylate -atropine  (LOMOTIL ) 2.5-0.025 MG tablet Take 1 tablet by mouth 4 (four) times daily as needed for diarrhea or loose stools. Take it along with immodium 60 tablet 1   fluticasone  (FLONASE ) 50 MCG/ACT nasal spray Place 2 sprays into both nostrils daily. 16 g 6   hydrocortisone  (ANUSOL -HC) 25 MG suppository Short term use as needed.  Home med.     levocetirizine (XYZAL ) 5 MG tablet Take 1 tablet (5 mg total) by mouth every evening. 30 tablet 12   Multiple Vitamins-Minerals (MULTIVITAMIN WITH MINERALS) tablet Take 1 tablet by mouth daily.     PARoxetine  (PAXIL ) 30 MG tablet TAKE 1 TABLET BY MOUTH EVERY DAY 90 tablet 1   zanubrutinib  (BRUKINSA ) 80 MG capsule Take 1 capsule (80 mg total) by mouth daily. 30 capsule 1   No current facility-administered medications for this visit.      Katherine [name]  PHYSICAL EXAMINATION: ECOG PERFORMANCE STATUS: 1 - Symptomatic but completely ambulatory  Vitals:   11/09/23 0943  BP: 136/62  Pulse: 68  Resp: 16  Temp: 97.6 F (36.4 C)  SpO2: 97%     Filed  Weights   11/09/23 0943  Weight: 147 lb 12.8 oz (67 kg)    Spleen-up to umbilicus.  Physical Exam HENT:     Head: Normocephalic and atraumatic.     Mouth/Throat:     Pharynx: No oropharyngeal exudate.  Eyes:     Pupils: Pupils are equal, round, and reactive to light.  Cardiovascular:     Rate and Rhythm: Normal  rate and regular rhythm.  Pulmonary:     Effort: No respiratory distress.     Breath sounds: No wheezing.     Comments: Decreased air entry bilaterally at the bases. Abdominal:     General: Bowel sounds are normal. There is no distension.     Palpations: Abdomen is soft. There is no mass.     Tenderness: There is no abdominal tenderness. There is no guarding or rebound.  Musculoskeletal:        General: No tenderness. Normal range of motion.     Cervical back: Normal range of motion and neck supple.  Skin:    General: Skin is warm.  Neurological:     Mental Status: She is alert and oriented to person, place, and time.  Psychiatric:        Mood and Affect: Affect normal.      LABORATORY DATA:  I have reviewed the data as listed Lab Results  Component Value Date   WBC 32.5 (H) 11/09/2023   HGB 13.5 11/09/2023   HCT 41.1 11/09/2023   MCV 98.6 11/09/2023   PLT 123 (L) 11/09/2023   Recent Labs    06/08/23 1007 08/10/23 0942 10/17/23 1125 11/09/23 0946  NA 139 141 143 139  K 4.1 3.8 4.2 4.5  CL 103 105 103 104  CO2 27 26 24 26   GLUCOSE 93 90 86 103*  BUN 14 12 13 11   CREATININE 0.86 0.78 1.01* 1.03*  CALCIUM  9.6 9.2 9.7 9.5  GFRNONAA >60 >60  --  54*  PROT 6.8 6.5  --  6.6  ALBUMIN 4.7 4.5  --  4.4  AST 19 21  --  23  ALT 13 14  --  13  ALKPHOS 76 70  --  77  BILITOT 1.4* 1.2  --  1.5*    RADIOGRAPHIC STUDIES: I have personally reviewed the radiological images as listed and agreed with the findings in the report. No results found.  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 Deletion-poor  tolerance to Ibrutinib -hematuria; ; and Acalburtinib [stopped in March 2022 because of diarrhea]. # AUG 21st, 2024- CT chest [Dr.Gonzalez]- Partially visualized splenomegaly, increased-again suggestive of progressive CLL.  Spleen up to umbilicus. Currently on Zanubrutinib  80 mg In AM [for last 3 weeks- SEP mid 2024].   # Patient tolerating Zanu 80 mg a day fairly well-monitor closely in the context of recent GI bleed see below.  White count 32 -improving thrombocytopenia improved- Stable.   # Anemia multifactorial CLL/GI bleed.  No hemolysis. Continue  gentle iron [iron biglycinate; 28 mg ] 1 pill a day-today hemoglobin is 13 improved from a baseline of 8. Stable.   # Bil Leg cramps/insomnia- Magnesium L-threonate is often used to improve sleep quality- stable.   # Bil LE swelling: sec to Zanubrutinib - G-1-2;continue Stocking/ leg elevation-  Stable  # Diarrhea grade 1 secondary to zabu continue in addition to Imodium recommend Lomotil . 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   Patient declines given concerns of diarrhea./Poor tolerance. Stable  #COPD/history of pneumonia -bilateral Jan 2023 [QUITsmoking-April, 2023; [facility]]-right middle lobe lung atelectasis [Dr.Gonzalez] - AUG 2024- CT- Improved aeration of the right middle lobe with residual mild plate like scarring versus atelectasis in the right middle lobe. Stable  # Easy brusing sec to zanurbitinib-stable.   # Vaccination:s/p flu shot;  ok with  covid for now. Recommend shingles vaccination [remote] -   # DISPOSITION:  # Follow up in 4  months- MD: Labs- cbc/cmp; LDH;quantitive immunoglobulin- Dr.B     All questions were answered. The patient knows to call the clinic with any problems, questions or concerns.   Cindy JONELLE Joe, MD 11/09/2023 11:23 AM

## 2023-11-11 LAB — IMMUNOGLOBULINS A/E/G/M, SERUM
IgA: 15 mg/dL — ABNORMAL LOW (ref 64–422)
IgE (Immunoglobulin E), Serum: <2 [IU]/mL — ABNORMAL LOW (ref 6–495)
IgG (Immunoglobin G), Serum: 157 mg/dL — ABNORMAL LOW (ref 586–1602)
IgM (Immunoglobulin M), Srm: <5 mg/dL — ABNORMAL LOW (ref 26–217)

## 2023-11-14 ENCOUNTER — Ambulatory Visit: Admitting: Pulmonary Disease

## 2023-11-14 ENCOUNTER — Encounter: Payer: Self-pay | Admitting: Pulmonary Disease

## 2023-11-14 VITALS — BP 110/60 | HR 74 | Temp 97.7°F | Ht 66.0 in | Wt 147.0 lb

## 2023-11-14 DIAGNOSIS — D803 Selective deficiency of immunoglobulin G [IgG] subclasses: Secondary | ICD-10-CM

## 2023-11-14 DIAGNOSIS — Z87891 Personal history of nicotine dependence: Secondary | ICD-10-CM

## 2023-11-14 DIAGNOSIS — J479 Bronchiectasis, uncomplicated: Secondary | ICD-10-CM

## 2023-11-14 DIAGNOSIS — C9110 Chronic lymphocytic leukemia of B-cell type not having achieved remission: Secondary | ICD-10-CM | POA: Diagnosis not present

## 2023-11-14 DIAGNOSIS — J449 Chronic obstructive pulmonary disease, unspecified: Secondary | ICD-10-CM | POA: Diagnosis not present

## 2023-11-14 MED ORDER — BREZTRI AEROSPHERE 160-9-4.8 MCG/ACT IN AERO: 2.0000 | INHALATION_SPRAY | Freq: Two times a day (BID) | RESPIRATORY_TRACT | Status: DC

## 2023-11-14 MED ORDER — BREZTRI AEROSPHERE 160-9-4.8 MCG/ACT IN AERO: 2.0000 | INHALATION_SPRAY | Freq: Two times a day (BID) | RESPIRATORY_TRACT | 11 refills | Status: DC

## 2023-11-14 NOTE — Progress Notes (Signed)
 Subjective:    Patient ID: Katherine [name], female    DOB: [birth year], 83 y.o.   MRN: [id number]  Patient Care Team: Valerio Melanie DASEN, NP as PCP - General (Nurse Practitioner) Isenstein, Arin L, MD (Dermatology) Viktoria Lamar DASEN, MD (Inactive) (Gastroenterology) Dessa Reyes ORN, MD (General Surgery) Rennie Cindy SAUNDERS, MD as Consulting Physician (Oncology) Tamea Dedra CROME, MD as Consulting Physician (Pulmonary Disease)  Chief Complaint  Patient presents with   COPD    BACKGROUND/INTERVAL:This is an 83 year old former smoker (quit January 2023) with 35-pack-year history of smoking and a history as noted below, who presents for follow-up on abnormal chest CT and bronchiectasis.  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  Normal CT chest due to mucous plugging and atelectasis which has cleared with chest vest therapy.  She had an admission to [facility] from 20 November 2022 through 22 November 2022 due to diverticular bleed.  No COPD exacerbation or bronchiectasis exacerbation at that time.  Last visit here was on 29 December 2022.  HPI Discussed the use of AI scribe software for clinical note transcription with the patient, who gave verbal consent to proceed.  History of Present Illness   Katherine [name] is an 83 year old female with COPD and bronchiectasis who presents for management of her respiratory conditions.  She uses her vest daily as part of her treatment regimen. She recently ran out of her inhaler and contacted the office for a refill. [facility]  She is currently following up with Dr. Rennie for CLL and reports a recent good checkup. She has been on a new medication for several months, which she takes once a day, this is Brukinsa .  She reports sleeping well and notes that her white blood count is the best it has been in years.  She has not yet received her flu shot for the fall but plans to get it at  the drugstore where it is free.       Review of Systems A 10 point review of systems was performed and it is as noted above otherwise negative.   Patient Active Problem List   Diagnosis Date Noted   Dizziness 10/07/2023   Elevated hemoglobin A1c measurement 06/11/2023   Controlled substance agreement signed 12/09/2021   Type O blood, Rh negative 05/09/2021   Splenomegaly 02/02/2020   Thrombocytopenia (HCC) 02/02/2020   Allergic rhinitis 07/18/2018   Goals of care, counseling/discussion 07/05/2018   History of prior cigarette smoking 05/01/2018   Centrilobular emphysema (HCC) 02/05/2018   Advanced care planning/counseling discussion 01/09/2018   Anxiety 01/04/2017   Hypercholesterolemia 01/08/2016   Atherosclerosis of aorta (HCC) 01/07/2016   History of colonic polyps 08/14/2015   CLL (chronic lymphocytic leukemia) (HCC) 05/28/2015   Senile purpura (HCC) 01/06/2015   Essential hypertension 01/06/2015   Depression, recurrent (HCC) 01/06/2015    Social History   Tobacco Use   Smoking status: Former    Current packs/day: 0.00    Average packs/day: 1 pack/day for 35.0 years (35.0 ttl pk-yrs)    Types: Cigarettes    Start date: 02/22/1986    Quit date: 02/22/2021    Years since quitting: 2.7   Smokeless tobacco: Never  Substance Use Topics   Alcohol use: No    Alcohol/week: 0.0 standard drinks of alcohol    Allergies  Allergen Reactions   Augmentin [Amoxicillin-Pot Clavulanate] Swelling    tongue   Biaxin [Clarithromycin] Swelling and Rash  tongue    Current Meds  Medication Sig   acetaminophen  (TYLENOL ) 325 MG tablet Take by mouth.   albuterol  (VENTOLIN  HFA) 108 (90 Base) MCG/ACT inhaler TAKE 2 PUFFS BY MOUTH EVERY 6 HOURS AS NEEDED FOR WHEEZE OR SHORTNESS OF BREATH   atorvastatin  (LIPITOR) 10 MG tablet TAKE 1 TABLET BY MOUTH EVERY DAY   benazepril  (LOTENSIN ) 40 MG tablet TAKE 1 TABLET BY MOUTH EVERY DAY   budesonide-glycopyrrolate-formoterol  (BREZTRI  AEROSPHERE)  160-9-4.8 MCG/ACT AERO inhaler Inhale 2 puffs into the lungs in the morning and at bedtime.   cholecalciferol (VITAMIN D3) 25 MCG (1000 UNIT) tablet Take 1,000 Units by mouth daily.   Cyanocobalamin (VITAMIN B-12) 5000 MCG TBDP Take 1 tablet by mouth daily at 2 PM.   diphenoxylate -atropine  (LOMOTIL ) 2.5-0.025 MG tablet Take 1 tablet by mouth 4 (four) times daily as needed for diarrhea or loose stools. Take it along with immodium   fluticasone  (FLONASE ) 50 MCG/ACT nasal spray Place 2 sprays into both nostrils daily.   hydrocortisone  (ANUSOL -HC) 25 MG suppository Short term use as needed.  Home med.   levocetirizine (XYZAL ) 5 MG tablet Take 1 tablet (5 mg total) by mouth every evening.   Multiple Vitamins-Minerals (MULTIVITAMIN WITH MINERALS) tablet Take 1 tablet by mouth daily.   PARoxetine  (PAXIL ) 30 MG tablet TAKE 1 TABLET BY MOUTH EVERY DAY   zanubrutinib  (BRUKINSA ) 80 MG capsule Take 1 capsule (80 mg total) by mouth daily.    Immunization History  Administered Date(s) Administered    sv, Bivalent, Protein Subunit Rsvpref,pf (Abrysvo) 02/12/2022   Fluad Quad(high Dose 65+) 11/08/2019, 11/12/2020, 12/09/2021   INFLUENZA, HIGH DOSE SEASONAL PF 01/04/2017, 11/07/2018, 11/16/2022   Influenza,inj,Quad PF,6+ Mos 01/06/2015, 11/21/2015, 10/07/2017   Influenza-Unspecified 01/06/2015, 11/21/2015, 01/04/2017, 10/07/2017, 11/07/2018, 12/03/2020   Moderna Covid-19 Fall Seasonal Vaccine 47yrs & older 07/29/2023   PFIZER(Purple Top)SARS-COV-2 Vaccination 05/11/2019, 06/01/2019, 02/05/2020, 12/07/2022   PNEUMOCOCCAL CONJUGATE-20 05/06/2023   Pneumococcal Conjugate-13 03/06/2014   Pneumococcal-Unspecified 02/22/1993, 02/23/2003   Td 07/24/2003   Tdap 11/18/2010   Zoster Recombinant(Shingrix ) 08/02/2017, 02/04/2018, 05/27/2022   Zoster, Live 11/03/2005        Objective:     BP 110/60   Pulse 74   Temp 97.7 F (36.5 C) (Temporal)   Ht 5' 6 (1.676 m)   Wt 147 lb (66.7 kg)   SpO2 94%   BMI  23.73 kg/m   SpO2: 94 %  GENERAL: Well-developed, thin woman, well-groomed, spry, no acute distress, fully ambulatory.  Stational dyspnea. HEAD: Normocephalic, atraumatic.  EYES: Pupils equal, round, reactive to light.  No scleral icterus.  MOUTH: Oral mucosa moist.  Dentition intact. NECK: Supple. No thyromegaly. Trachea midline. No JVD.  No adenopathy. PULMONARY: Good air entry bilaterally.  Coarse breath sounds otherwise, no adventitious sounds. CARDIOVASCULAR: S1 and S2. Regular rate and rhythm.  No rubs, murmurs or gallops heard. ABDOMEN: Benign. MUSCULOSKELETAL: No joint deformity, no clubbing, 1+ edema of the feet/ankles noted today, this is chronic. NEUROLOGIC: No focal deficit noted, no gait disturbance, speech is fluent. SKIN: Intact,warm,dry.  Multiple ecchymosis/senile purpura particularly upper extremities. PSYCH: Mood and behavior normal.        Assessment & Plan:     ICD-10-CM   1. Stage 2 moderate COPD by GOLD classification (HCC)  J44.9     2. Bronchiectasis without complication (HCC)  J47.9     3. CLL (chronic lymphocytic leukemia) (HCC)  C91.10     4. IgG deficiency (HCC) -secondary  D80.3      Meds  ordered this encounter  Medications   budesonide-glycopyrrolate-formoterol  (BREZTRI  AEROSPHERE) 160-9-4.8 MCG/ACT AERO inhaler    Sig: Inhale 2 puffs into the lungs in the morning and at bedtime.    Dispense:  10.7 g    Refill:  11   budesonide-glycopyrrolate-formoterol  (BREZTRI  AEROSPHERE) 160-9-4.8 MCG/ACT AERO inhaler    Sig: Inhale 2 puffs into the lungs in the morning and at bedtime.    Dispense:  2 each    Lot Number?:   [id number]    Expiration Date?:   12/23/2025    Manufacturer?:   [employer] [[id number]]    NDC:   9689-5383-71 [[id number]]    Quantity:   2   Discussion:    Chronic obstructive pulmonary disease (COPD) and bronchiectasis complicated by chronic lymphocytic leukemia (CLL) She is compliant with vest therapy and uses it daily. She ran out of  her inhaler but will receive samples today. Her white blood count is optimal, benefiting her lung condition. She is on a new medication prescribed by Dr. Rennie which she has been taking for several months. She reports good sleep and a recent positive checkup with Dr. Rennie. - Provide samples of Breztri  inhaler and instruct to rinse mouth after use - [facility] - Advise to continue using vest therapy daily - Recommend getting flu shot at the drugstore - Schedule follow-up appointment in 4 months      Advised if symptoms do not improve or worsen, to please contact office for sooner follow up or seek emergency care.    I spent 30 minutes of dedicated to the care of this patient on the date of this encounter to include pre-visit review of records, face-to-face time with the patient discussing conditions above, post visit ordering of testing, clinical documentation with the electronic health record, making appropriate referrals as documented, and communicating necessary findings to members of the patients care team.     C. Leita Sanders, MD Advanced Bronchoscopy PCCM [facility]    *This note was generated using voice recognition software/Dragon and/or AI transcription program.  Despite best efforts to proofread, errors can occur which can change the meaning. Any transcriptional errors that result from this process are unintentional and may not be fully corrected at the time of dictation.

## 2023-11-14 NOTE — Patient Instructions (Addendum)
 VISIT SUMMARY:  Today, we discussed the management of your chronic obstructive pulmonary disease (COPD) and bronchiectasis. You mentioned that you have been using your vest daily and recently ran out of your inhaler. You also shared that you are following up with Dr. Rennie and have been on a new medication for several months. You reported sleeping well and noted that your white blood count is the best it has been in years. You have not yet received your flu shot for the fall but plan to get it at the drugstore where it is free.  YOUR PLAN:  -CHRONIC OBSTRUCTIVE PULMONARY DISEASE (COPD) AND BRONCHIECTASIS: COPD and bronchiectasis are long-term lung conditions that make it hard to breathe. You are using your vest therapy daily, which is good. You ran out of your inhaler, so we provided you with samples of the Breztri  inhaler today and [facility]. Please remember to rinse your mouth after using the inhaler. Continue using your vest therapy daily and get your flu shot at the drugstore.  INSTRUCTIONS:  Please schedule a follow-up appointment in 4 months.

## 2023-11-15 ENCOUNTER — Other Ambulatory Visit: Payer: Self-pay | Admitting: Nurse Practitioner

## 2023-11-15 ENCOUNTER — Other Ambulatory Visit: Payer: Self-pay | Admitting: Pulmonary Disease

## 2023-11-15 DIAGNOSIS — I10 Essential (primary) hypertension: Secondary | ICD-10-CM

## 2023-11-16 NOTE — Telephone Encounter (Signed)
 Requested Prescriptions  Pending Prescriptions Disp Refills   atorvastatin  (LIPITOR) 10 MG tablet [Pharmacy Med Name: ATORVASTATIN  10 MG TABLET] 90 tablet 0    Sig: TAKE 1 TABLET BY MOUTH EVERY DAY     Cardiovascular:  Antilipid - Statins Failed - 11/16/2023 12:11 PM      Failed - Lipid Panel in normal range within the last 12 months    Cholesterol, Total  Date Value Ref Range Status  06/13/2023 146 100 - 199 mg/dL Final   Cholesterol Piccolo, Waived  Date Value Ref Range Status  03/09/2016 119 <200 mg/dL Final    Comment:                            Desirable                <200                         Borderline High      200- 239                         High                     >239    LDL Chol Calc (NIH)  Date Value Ref Range Status  06/13/2023 72 0 - 99 mg/dL Final   HDL  Date Value Ref Range Status  06/13/2023 57 >39 mg/dL Final   Triglycerides  Date Value Ref Range Status  06/13/2023 93 0 - 149 mg/dL Final   Triglycerides Piccolo,Waived  Date Value Ref Range Status  03/09/2016 273 (H) <150 mg/dL Final    Comment:                            Normal                   <150                         Borderline High     150 - 199                         High                200 - 499                         Very High                >499          Passed - Patient is not pregnant      Passed - Valid encounter within last 12 months    Recent Outpatient Visits           1 month ago CLL (chronic lymphocytic leukemia) (HCC)   [facility] [place], Barnard T, NP   5 months ago CLL (chronic lymphocytic leukemia) (HCC)   [facility] [place], Jolene T, NP              Refused Prescriptions Disp Refills   benazepril  (LOTENSIN ) 40 MG tablet [Pharmacy Med Name: BENAZEPRIL  HCL 40 MG TABLET] 90 tablet 1    Sig: TAKE 1 TABLET BY MOUTH EVERY DAY  Cardiovascular:  ACE Inhibitors Failed - 11/16/2023 12:11 PM      Failed -  Cr in normal range and within 180 days    Creatinine  Date Value Ref Range Status  11/09/2023 1.03 (H) 0.44 - 1.00 mg/dL Final  95/81/7975 758.3 20.0 - 300.0 mg/dL Final  93/80/7984 9.04 0.60 - 1.30 mg/dL Final         Passed - K in normal range and within 180 days    Potassium  Date Value Ref Range Status  11/09/2023 4.5 3.5 - 5.1 mmol/L Final  08/10/2013 4.1 3.5 - 5.1 mmol/L Final         Passed - Patient is not pregnant      Passed - Last BP in normal range    BP Readings from Last 1 Encounters:  11/14/23 110/60         Passed - Valid encounter within last 6 months    Recent Outpatient Visits           1 month ago CLL (chronic lymphocytic leukemia) (HCC)   [facility] [place], [place] T, NP   5 months ago CLL (chronic lymphocytic leukemia) (HCC)   [facility] [place], Melanie DASEN, NP

## 2023-11-21 IMAGING — CT CT CHEST W/ CM
2 of 4 series · 15 of 36 positions shown, 18 images · IV contrast (agent unspecified)
Comparison: Chest CT 01/09/2020.

CLINICAL DATA: 80-year-old female with history of pneumonia.

EXAM:
CT CHEST WITH CONTRAST
TECHNIQUE: Multidetector CT imaging of the chest was performed during
intravenous contrast administration.

[Series 2: axial chest 2.00 · axial · 0.60mm/px · z∈[-1253,-961]mm · 12 of 174 slices shown, 15 images]
[im 14/174  mediastinal]
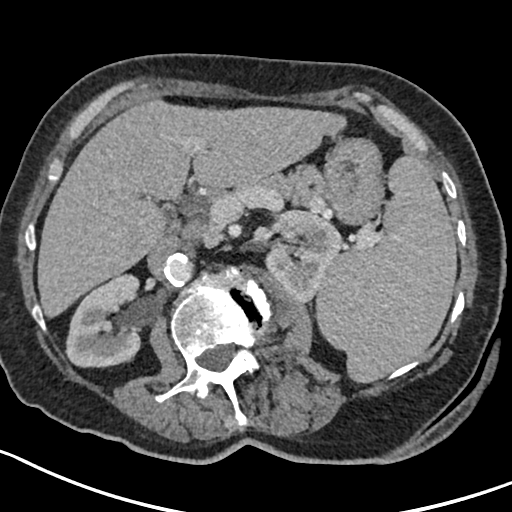
[im 14/174  lung]
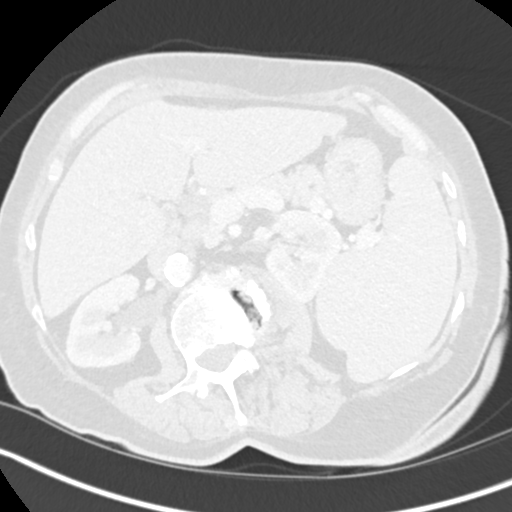
[im 27/174  lung]
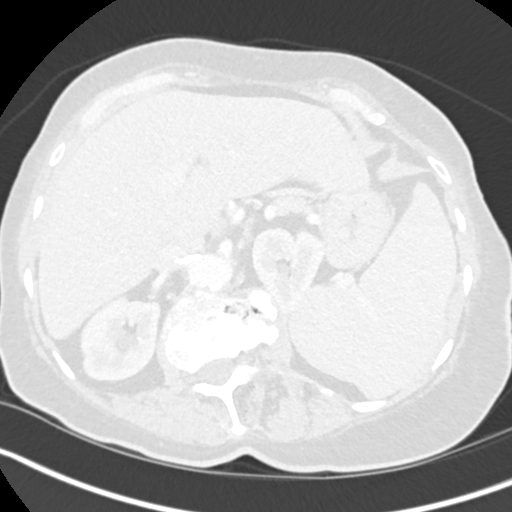
[im 40/174  lung]
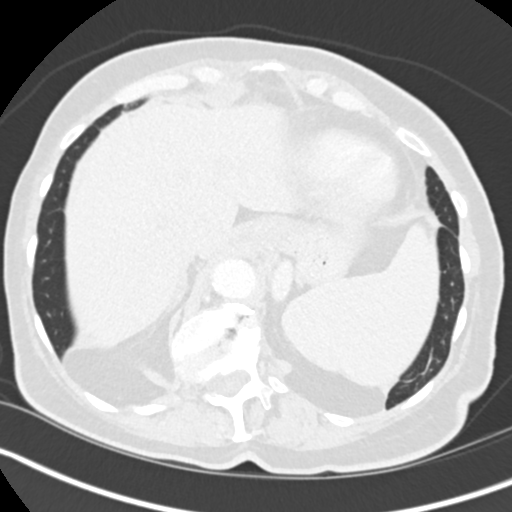
[im 54/174  lung]
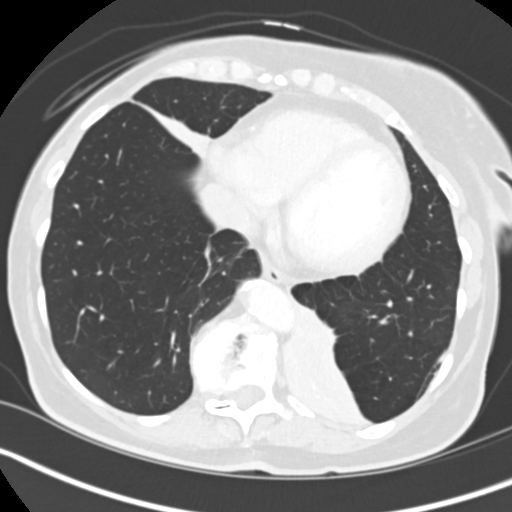
[im 67/174  mediastinal]
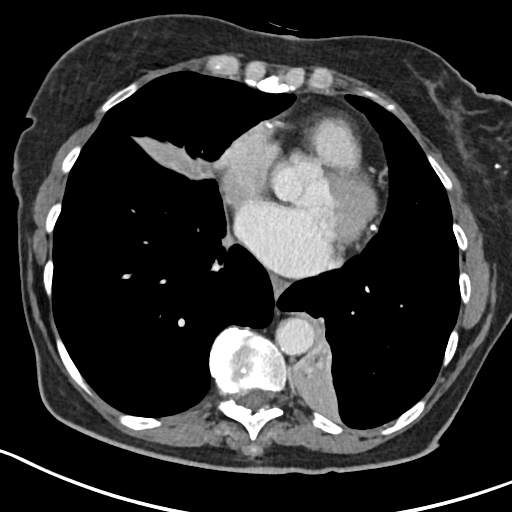
[im 67/174  lung]
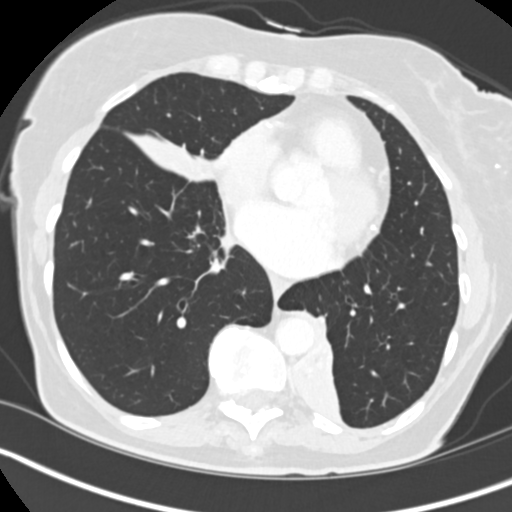
[im 80/174  lung]
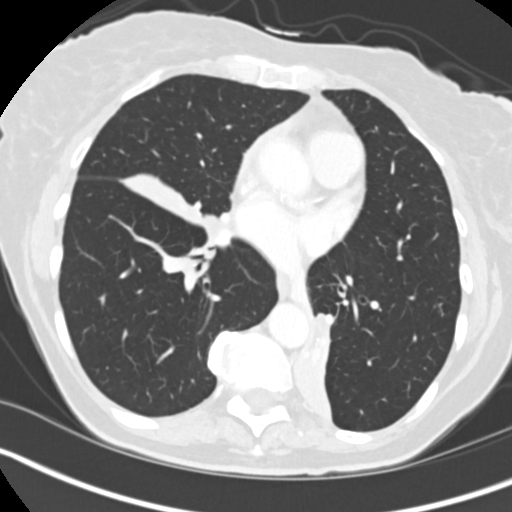
[im 94/174  lung]
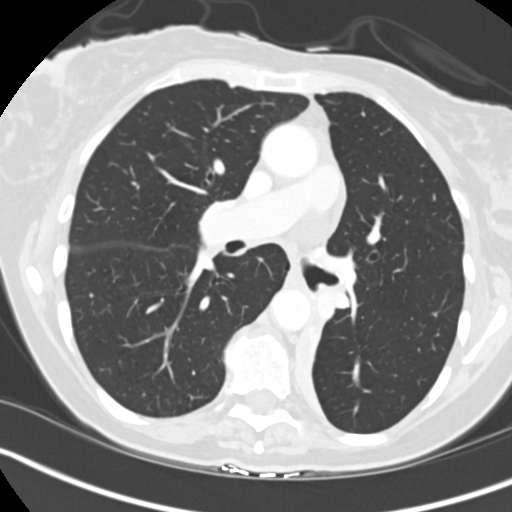
[im 107/174  lung]
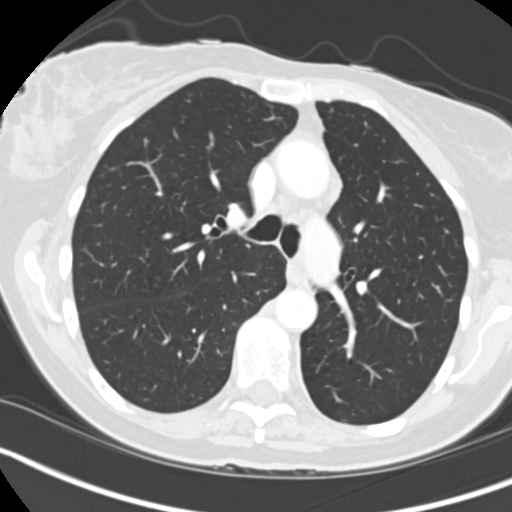
[im 120/174  mediastinal]
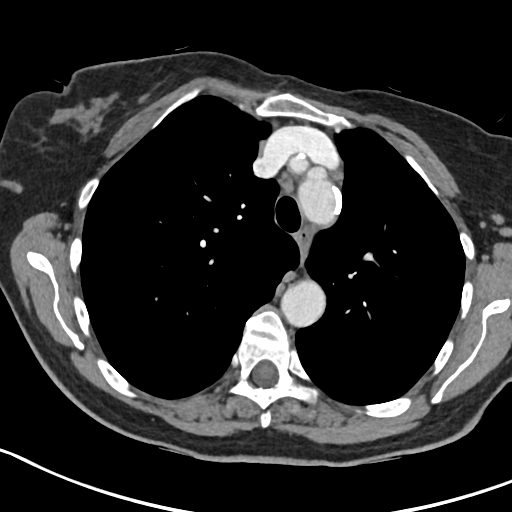
[im 120/174  lung]
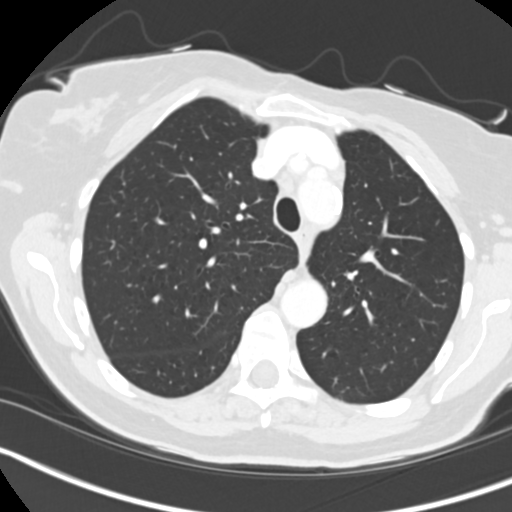
[im 134/174  lung]
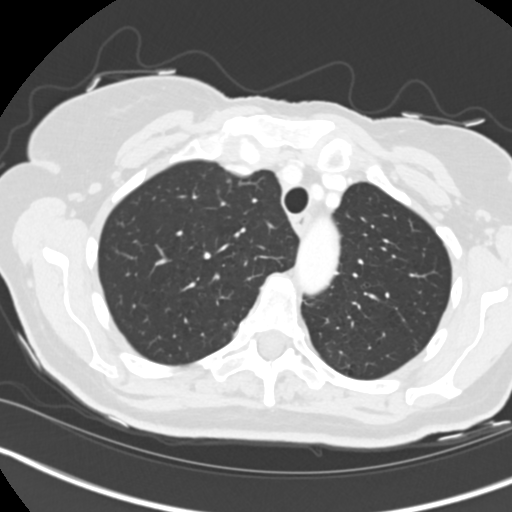
[im 147/174  lung]
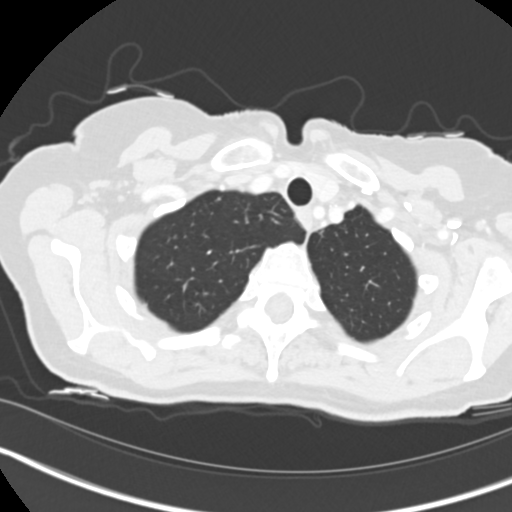
[im 160/174  lung]
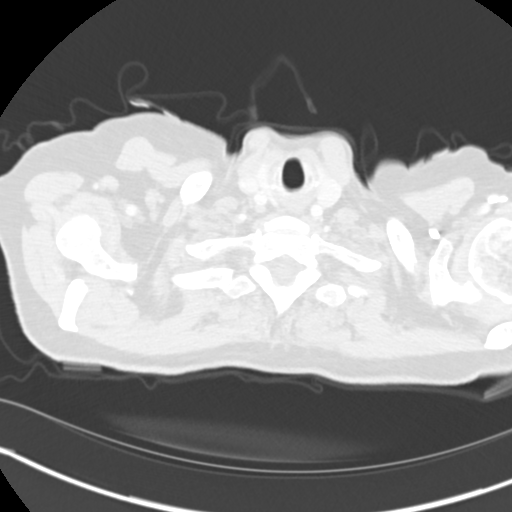

[Series 4: coronal chest 2.00 cor · coronal · 0.60mm/px · 3 of 135 slices shown]
[im 27/135  lung]
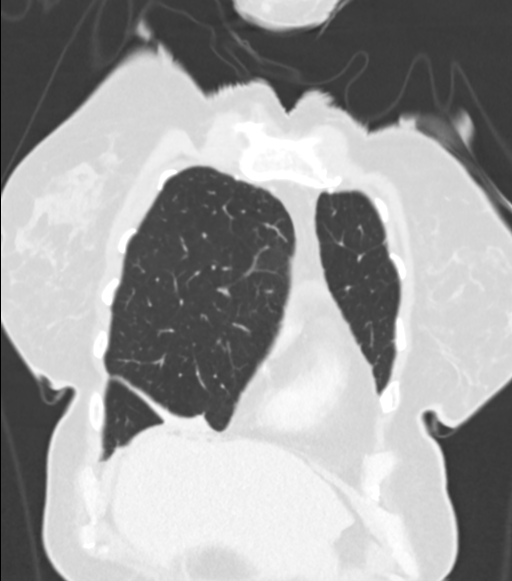
[im 54/135  lung]
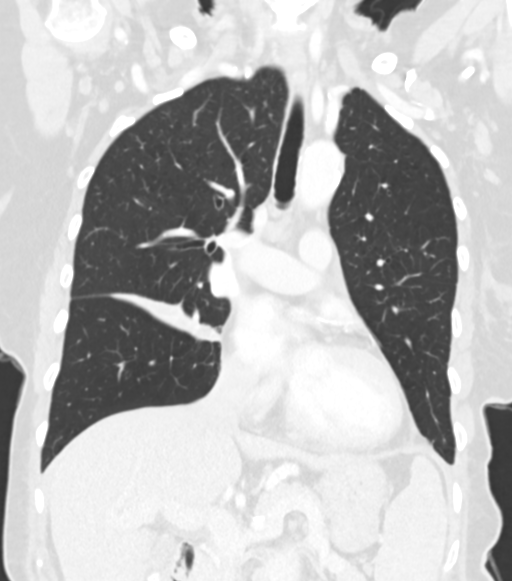
[im 81/135  lung]
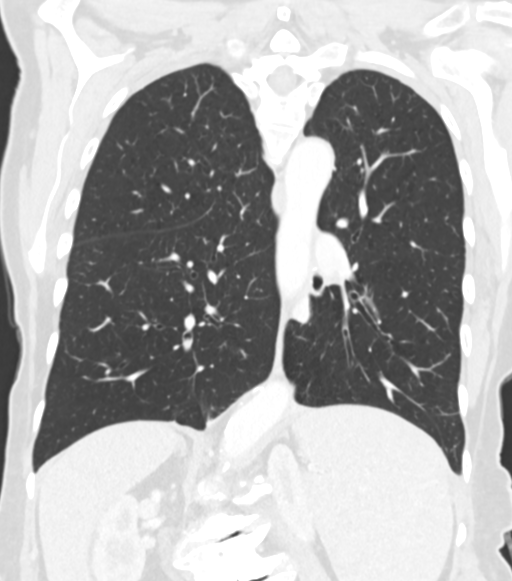

[15 of 36 positions shown; findings below may reference images not displayed]

RADIATION DOSE REDUCTION: This exam was performed according to the
departmental dose-optimization program which includes automated
exposure control, adjustment of the mA and/or kV according to
patient size and/or use of iterative reconstruction technique.

CONTRAST:  75mL OMNIPAQUE IOHEXOL 300 MG/ML  SOLN
FINDINGS: Cardiovascular: Heart size is normal. There is no significant
pericardial fluid, thickening or pericardial calcification. There is
aortic atherosclerosis, as well as atherosclerosis of the great
vessels of the mediastinum and the coronary arteries, including
calcified atherosclerotic plaque in the left main, left anterior
descending, left circumflex and right coronary arteries.

Mediastinum/Nodes: No pathologically enlarged mediastinal or hilar
lymph nodes. Esophagus is unremarkable in appearance. No axillary
lymphadenopathy.

Lungs/Pleura: There is complete collapse of both the right middle
and left lower lobes. Abrupt cut off of both the right middle lobe
and left lower lobe bronchi is noted. No clear obstructing mass
identified. Whether this represents endobronchial lesions
obstructing the lumen or retained secretions is uncertain. However,
there are retained secretions elsewhere in the lungs, most notably
in the right lower lobe and left upper lobe endobronchial trees. No
definite consolidative airspace disease. No pleural effusions. No
definite suspicious appearing pulmonary nodules or masses are noted.

Upper Abdomen: Aortic atherosclerosis. Calcified gallstone
incompletely imaged in the neck of the gallbladder. Spleen is
incompletely imaged but appears clearly enlarged, measuring at least
14.1 x 7.7 cm on axial images.

Musculoskeletal: There are no aggressive appearing lytic or blastic
lesions noted in the visualized portions of the skeleton.
IMPRESSION: 1. Complete collapse of the right middle lobe and left lower lobe
either from nonvisualized endobronchial lesions or retained
secretions. Outpatient referral to Pulmonology for further clinical
evaluation and potential bronchoscopy is recommended.
2. Aortic atherosclerosis, in addition to left main and three-vessel
coronary artery disease.
3. Splenomegaly.
4. Cholelithiasis.

Aortic Atherosclerosis ([id number]).

## 2023-11-24 ENCOUNTER — Other Ambulatory Visit: Payer: Self-pay | Admitting: Internal Medicine

## 2023-11-24 ENCOUNTER — Other Ambulatory Visit: Payer: Self-pay

## 2023-11-24 DIAGNOSIS — C9110 Chronic lymphocytic leukemia of B-cell type not having achieved remission: Secondary | ICD-10-CM

## 2023-11-28 ENCOUNTER — Other Ambulatory Visit: Payer: Self-pay

## 2023-11-28 ENCOUNTER — Other Ambulatory Visit: Payer: Self-pay | Admitting: Internal Medicine

## 2023-11-28 DIAGNOSIS — C9110 Chronic lymphocytic leukemia of B-cell type not having achieved remission: Secondary | ICD-10-CM

## 2023-11-28 NOTE — Progress Notes (Signed)
 Specialty Pharmacy Refill Coordination Note  Katherine [name] is a 83 y.o. female contacted today regarding refills of specialty medication(s) Zanubrutinib  (Brukinsa )   Patient requested Delivery   Delivery date: 12/05/23   Verified address: [address] [place]; [employer]   Medication will be filled on 12/02/23.

## 2023-12-01 ENCOUNTER — Telehealth: Payer: Self-pay

## 2023-12-01 ENCOUNTER — Encounter: Payer: Self-pay | Admitting: Internal Medicine

## 2023-12-01 ENCOUNTER — Other Ambulatory Visit: Payer: Self-pay

## 2023-12-01 MED ORDER — BRUKINSA 80 MG PO CAPS
80.0000 mg | ORAL_CAPSULE | Freq: Every day | ORAL | 1 refills | Status: DC
  Filled 2023-12-01: qty 30, 30d supply, fill #0

## 2023-12-01 MED ORDER — BREZTRI AEROSPHERE 160-9-4.8 MCG/ACT IN AERO: 2.0000 | INHALATION_SPRAY | Freq: Two times a day (BID) | RESPIRATORY_TRACT | 3 refills | Status: DC

## 2023-12-01 NOTE — Telephone Encounter (Signed)
 Copied from CRM [id number]. Topic: Clinical - Prescription Issue >> Dec 01, 2023 11:11 AM Leila C wrote: Reason for RMF:Ejupzwu states Dr. Tamea informed patient has 11 refills of budesonide-glycopyrrolate-formoterol  (BREZTRI  AEROSPHERE) 160-9-4.8 MCG/ACT AERO inhaler, however [facility] a message that the insurance will not cover. Patient is out of medication and finished the samples Dr. Tamea provided yesterday and is asking for help since she cannot afford the medication. Patient denies any symptoms at the moment. Please advise and call back [phone].   [facility] [address] [place] [facility]  [address] [place], [place] [address] Phone:[phone]

## 2023-12-01 NOTE — Telephone Encounter (Signed)
 I spoke with the patient. She said she normally gets the Breztri  through the assistance program but has not received any refills.  It looks [facility]  I have sent the script to [facility] and notified the patient. I did offer to leave samples at the front desk for her but she did not know when she would be able to come get them.  Nothing further needed.

## 2023-12-02 ENCOUNTER — Other Ambulatory Visit: Payer: Self-pay

## 2023-12-12 NOTE — Telephone Encounter (Signed)
 Lm x1 for the patient.

## 2023-12-12 NOTE — Telephone Encounter (Signed)
 Copied from CRM #[id number]. Topic: Clinical - Prescription Issue >> Dec 12, 2023  1:20 PM Whitney O wrote: Reason for CRM: patient is calling because she still have not received her BREZTRI  . I see that it was sent to [facility] pharmacy which is mail order . Patient says she didn't have to go through this the last time .SABRA Please reach out to patient concerning this medication [phone]

## 2023-12-13 NOTE — Telephone Encounter (Signed)
 Lm x2 for the patient.

## 2023-12-15 NOTE — Patient Instructions (Signed)
 Be Involved in Caring For Your Health:  Taking Medications When medications are taken as directed, they can greatly improve your health. But if they are not taken as prescribed, they may not work. In some cases, not taking them correctly can be harmful. To help ensure your treatment remains effective and safe, understand your medications and how to take them. Bring your medications to each visit for review by your provider.  Your lab results, notes, and after visit summary will be available on My Chart. We strongly encourage you to use this feature. If lab results are abnormal the clinic will contact you with the appropriate steps. If the clinic does not contact you assume the results are satisfactory. You can always view your results on My Chart. If you have questions regarding your health or results, please contact the clinic during office hours. You can also ask questions on My Chart.  We at [facility] are grateful that you chose [place] to provide your care. We strive to provide evidence-based and compassionate care and are always looking for feedback. If you get a survey from the clinic please complete this so we can hear your opinions.  Heart-Healthy Eating Plan Many factors influence your heart health, including eating and exercise habits. Heart health is also called coronary health. Coronary risk increases with abnormal blood fat (lipid) levels. A heart-healthy eating plan includes limiting unhealthy fats, increasing healthy fats, limiting salt (sodium) intake, and making other diet and lifestyle changes. What is my plan? Your health care provider may recommend that: You limit your fat intake to _________% or less of your total calories each day. You limit your saturated fat intake to _________% or less of your total calories each day. You limit the amount of cholesterol in your diet to less than _________ mg per day. You limit the amount of sodium in your diet to less than _________  mg per day. What are tips for following this plan? Cooking Cook foods using methods other than frying. Baking, boiling, grilling, and broiling are all good options. Other ways to reduce fat include: Removing the skin from poultry. Removing all visible fats from meats. Steaming vegetables in water or broth. Meal planning  At meals, imagine dividing your plate into fourths: Fill one-half of your plate with vegetables and green salads. Fill one-fourth of your plate with whole grains. Fill one-fourth of your plate with lean protein foods. Eat 2-4 cups of vegetables per day. One cup of vegetables equals 1 cup (91 g) broccoli or cauliflower florets, 2 medium carrots, 1 large bell pepper, 1 large sweet potato, 1 large tomato, 1 medium white potato, 2 cups (150 g) raw leafy greens. Eat 1-2 cups of fruit per day. One cup of fruit equals 1 small apple, 1 large banana, 1 cup (237 g) mixed fruit, 1 large orange,  cup (82 g) dried fruit, 1 cup (240 mL) 100% fruit juice. Eat more foods that contain soluble fiber. Examples include apples, broccoli, carrots, beans, peas, and barley. Aim to get 25-30 g of fiber per day. Increase your consumption of legumes, nuts, and seeds to 4-5 servings per week. One serving of dried beans or legumes equals  cup (90 g) cooked, 1 serving of nuts is  oz (12 almonds, 24 pistachios, or 7 walnut halves), and 1 serving of seeds equals  oz (8 g). Fats Choose healthy fats more often. Choose monounsaturated and polyunsaturated fats, such as olive and canola oils, avocado oil, flaxseeds, walnuts, almonds, and seeds. Eat  more omega-3 fats. Choose salmon, mackerel, sardines, tuna, flaxseed oil, and ground flaxseeds. Aim to eat fish at least 2 times each week. Check food labels carefully to identify foods with trans fats or high amounts of saturated fat. Limit saturated fats. These are found in animal products, such as meats, butter, and cream. Plant sources of saturated fats  include palm oil, palm kernel oil, and coconut oil. Avoid foods with partially hydrogenated oils in them. These contain trans fats. Examples are stick margarine, some tub margarines, cookies, crackers, and other baked goods. Avoid fried foods. General information Eat more home-cooked food and less restaurant, buffet, and fast food. Limit or avoid alcohol. Limit foods that are high in added sugar and simple starches such as foods made using white refined flour (white breads, pastries, sweets). Lose weight if you are overweight. Losing just 5-10% of your body weight can help your overall health and prevent diseases such as diabetes and heart disease. Monitor your sodium intake, especially if you have high blood pressure. Talk with your health care provider about your sodium intake. Try to incorporate more vegetarian meals weekly. What foods should I eat? Fruits All fresh, canned (in natural juice), or frozen fruits. Vegetables Fresh or frozen vegetables (raw, steamed, roasted, or grilled). Green salads. Grains Most grains. Choose whole wheat and whole grains most of the time. Rice and pasta, including brown rice and pastas made with whole wheat. Meats and other proteins Lean, well-trimmed beef, veal, pork, and lamb. Chicken and [place] without skin. All fish and shellfish. Wild duck, rabbit, pheasant, and venison. Egg whites or low-cholesterol egg substitutes. Dried beans, peas, lentils, and tofu. Seeds and most nuts. Dairy Low-fat or nonfat cheeses, including ricotta and mozzarella. Skim or 1% milk (liquid, powdered, or evaporated). Buttermilk made with low-fat milk. Nonfat or low-fat yogurt. Fats and oils Non-hydrogenated (trans-free) margarines. Vegetable oils, including soybean, sesame, sunflower, olive, avocado, peanut, safflower, corn, canola, and cottonseed. Salad dressings or mayonnaise made with a vegetable oil. Beverages Water (mineral or sparkling). Coffee and tea. Unsweetened ice  tea. Diet beverages. Sweets and desserts Sherbet, gelatin, and fruit ice. Small amounts of dark chocolate. Limit all sweets and desserts. Seasonings and condiments All seasonings and condiments. The items listed above may not be a complete list of foods and beverages you can eat. Contact a dietitian for more options. What foods should I avoid? Fruits Canned fruit in heavy syrup. Fruit in cream or butter sauce. Fried fruit. Limit coconut. Vegetables Vegetables cooked in cheese, cream, or butter sauce. Fried vegetables. Grains Breads made with saturated or trans fats, oils, or whole milk. Croissants. Sweet rolls. Donuts. High-fat crackers, such as cheese crackers and chips. Meats and other proteins Fatty meats, such as hot dogs, ribs, sausage, bacon, rib-eye roast or steak. High-fat deli meats, such as salami and bologna. Caviar. Domestic duck and goose. Organ meats, such as liver. Dairy Cream, sour cream, cream cheese, and creamed cottage cheese. Whole-milk cheeses. Whole or 2% milk (liquid, evaporated, or condensed). Whole buttermilk. Cream sauce or high-fat cheese sauce. Whole-milk yogurt. Fats and oils Meat fat, or shortening. Cocoa butter, hydrogenated oils, palm oil, coconut oil, palm kernel oil. Solid fats and shortenings, including bacon fat, salt pork, lard, and butter. Nondairy cream substitutes. Salad dressings with cheese or sour cream. Beverages Regular sodas and any drinks with added sugar. Sweets and desserts Frosting. Pudding. Cookies. Cakes. Pies. Milk chocolate or white chocolate. Buttered syrups. Full-fat ice cream or ice cream drinks. The items listed above may  not be a complete list of foods and beverages to avoid. Contact a dietitian for more information. Summary Heart-healthy meal planning includes limiting unhealthy fats, increasing healthy fats, limiting salt (sodium) intake and making other diet and lifestyle changes. Lose weight if you are overweight. Losing just  5-10% of your body weight can help your overall health and prevent diseases such as diabetes and heart disease. Focus on eating a balance of foods, including fruits and vegetables, low-fat or nonfat dairy, lean protein, nuts and legumes, whole grains, and heart-healthy oils and fats. This information is not intended to replace advice given to you by your health care provider. Make sure you discuss any questions you have with your health care provider. Document Revised: 03/16/2021 Document Reviewed: 03/16/2021 Elsevier Patient Education  2024 [employer].

## 2023-12-16 ENCOUNTER — Encounter: Payer: Self-pay | Admitting: Nurse Practitioner

## 2023-12-16 ENCOUNTER — Telehealth: Payer: Self-pay | Admitting: Nurse Practitioner

## 2023-12-16 ENCOUNTER — Ambulatory Visit (INDEPENDENT_AMBULATORY_CARE_PROVIDER_SITE_OTHER): Admitting: Nurse Practitioner

## 2023-12-16 VITALS — BP 134/82 | HR 62 | Temp 97.7°F | Wt 148.2 lb

## 2023-12-16 DIAGNOSIS — F339 Major depressive disorder, recurrent, unspecified: Secondary | ICD-10-CM | POA: Diagnosis not present

## 2023-12-16 DIAGNOSIS — Z23 Encounter for immunization: Secondary | ICD-10-CM

## 2023-12-16 DIAGNOSIS — I10 Essential (primary) hypertension: Secondary | ICD-10-CM

## 2023-12-16 DIAGNOSIS — F419 Anxiety disorder, unspecified: Secondary | ICD-10-CM

## 2023-12-16 DIAGNOSIS — D696 Thrombocytopenia, unspecified: Secondary | ICD-10-CM | POA: Diagnosis not present

## 2023-12-16 DIAGNOSIS — R7309 Other abnormal glucose: Secondary | ICD-10-CM

## 2023-12-16 DIAGNOSIS — J432 Centrilobular emphysema: Secondary | ICD-10-CM | POA: Diagnosis not present

## 2023-12-16 DIAGNOSIS — C9110 Chronic lymphocytic leukemia of B-cell type not having achieved remission: Secondary | ICD-10-CM

## 2023-12-16 DIAGNOSIS — D692 Other nonthrombocytopenic purpura: Secondary | ICD-10-CM | POA: Diagnosis not present

## 2023-12-16 DIAGNOSIS — E7800 Pure hypercholesterolemia, unspecified: Secondary | ICD-10-CM

## 2023-12-16 LAB — BAYER DCA HB A1C WAIVED: HB A1C (BAYER DCA - WAIVED): 5.3 % (ref 4.8–5.6)

## 2023-12-16 MED ORDER — BENAZEPRIL HCL 40 MG PO TABS: 40.0000 mg | ORAL_TABLET | Freq: Every day | ORAL | 4 refills | Status: AC

## 2023-12-16 MED ORDER — ATORVASTATIN CALCIUM 10 MG PO TABS: 10.0000 mg | ORAL_TABLET | Freq: Every day | ORAL | 4 refills | Status: AC

## 2023-12-16 MED ORDER — LORAZEPAM 0.5 MG PO TABS: 0.5000 mg | ORAL_TABLET | Freq: Two times a day (BID) | ORAL | 2 refills | Status: AC | PRN

## 2023-12-16 MED ORDER — LORATADINE 10 MG PO TABS: 10.0000 mg | ORAL_TABLET | Freq: Every day | ORAL | 11 refills | Status: AC

## 2023-12-16 MED ORDER — PAROXETINE HCL 30 MG PO TABS: 30.0000 mg | ORAL_TABLET | Freq: Every day | ORAL | 4 refills | Status: AC

## 2023-12-16 NOTE — Assessment & Plan Note (Signed)
Chronic.  Noted on exam, recommend gentle skin care and monitoring for wounds, if wounds present immediately alert provider.

## 2023-12-16 NOTE — Telephone Encounter (Signed)
 ATC the patient. It went straight to voicemail. I left another message.I have mailed her a letter.  Closing this encounter per office protocol.

## 2023-12-16 NOTE — Assessment & Plan Note (Addendum)
 With underlying CLL, followed by oncology.  Continue this collaboration. Recent labs and notes reviewed.

## 2023-12-16 NOTE — Progress Notes (Signed)
 BP 134/82 (BP Location: Left Arm, Patient Position: Standing;Sitting)   Pulse 62   Temp 97.7 F (36.5 C) (Oral)   Wt 148 lb 3.2 oz (67.2 kg)   SpO2 95%   BMI 23.92 kg/m    Subjective:    Patient ID: Katherine [name], female    DOB: 03/21/1940, 83 y.o.   MRN: [id number]  HPI: Katherine [name] is a 83 y.o. female  Chief Complaint  Patient presents with   Follow-up    Pt states everything is going well    HYPERTENSION / HYPERLIPIDEMIA Taking Benazepril  and Atorvastatin . Satisfied with current treatment? yes Duration of hypertension: chronic BP monitoring frequency: not checking BP range:  BP medication side effects: no Duration of hyperlipidemia: chronic Cholesterol medication side effects: no Cholesterol supplements: none Medication compliance: good compliance Aspirin: no Recent stressors: no Recurrent headaches: no Visual changes: no Palpitations: no Dyspnea: no Chest pain: no Lower extremity edema: yes at baseline Dizzy/lightheaded: no   Impaired Fasting Glucose HbA1C:  Lab Results  Component Value Date   HGBA1C 6.0 (H) 06/13/2023  Duration of elevated blood sugar: years Polydipsia: no Polyuria: no Weight change: no Visual disturbance: no Glucose Monitoring: no    Accucheck frequency: Not Checking    Fasting glucose:     Post prandial:  Diabetic Education: Not Completed Family history of diabetes: no   COPD Saw pulmonary last on 11/14/23.  Emphysema and aortic atherosclerosis. Uses Breztri  daily and then Albuterol  as needed. Gets Breztri  via assistance. Not a current smoker, has not smoked in >3 years.    Has CLL and followed closely by oncology with last visit 11/09/23, Brukinsa  taken daily, does cause some diarrhea.  They monitor platelet count, has history of low levels. COPD status: stable Satisfied with current treatment?: yes Oxygen use: no Dyspnea frequency: no Cough frequency: no Rescue inhaler frequency: two times daily, morning and  night Limitation of activity: no Productive cough: none Last Spirometry: with pulmonary Pneumovax: Up to Date Influenza: Up to Date  ANXIETY/STRESS Continues Paxil  30 MG daily. Took Ativan  0.5 MG BID in past, but stopped taking one year ago.  Continues to do well without it. Pt is aware of risks of benzo medication use to include increased sedation, respiratory suppression, falls, dependence and cardiovascular events. PDMP review last filled 08/17/22. Her sister that she was close to just passed away. Duration: controlled Anxious mood: no Excessive worrying: no Irritability: occasional Sweating: no Nausea: no Palpitations:no Hyperventilation: no Panic attacks: no Agoraphobia: no  Obscessions/compulsions: no Depressed mood: no    12/16/2023   10:15 AM 11/09/2023    9:58 AM 10/17/2023   10:38 AM 06/13/2023    9:06 AM 12/10/2022   10:14 AM  Depression screen PHQ 2/9  Decreased Interest 0 0 0 0 0  Down, Depressed, Hopeless 1 0 0 0 0  PHQ - 2 Score 1 0 0 0 0  Altered sleeping 0  0 0 0  Tired, decreased energy 1  0 0 0  Change in appetite 0  0 0 0  Feeling bad or failure about yourself  0  0 0 0  Trouble concentrating 0  0 0 0  Moving slowly or fidgety/restless 0  0 0 0  Suicidal thoughts 0  0 0 0  PHQ-9 Score 2  0 0 0  Difficult doing work/chores Not difficult at all  Not difficult at all Not difficult at all Not difficult at all       12/16/2023  10:15 AM 10/17/2023   10:39 AM 06/13/2023    9:07 AM 12/10/2022   10:14 AM  GAD 7 : Generalized Anxiety Score  Nervous, Anxious, on Edge 1 0 0 0  Control/stop worrying 1 0 0 0  Worry too much - different things 0 0 0 0  Trouble relaxing 0 0 0 0  Restless 0 0 0 0  Easily annoyed or irritable 0 0 0 0  Afraid - awful might happen 0 0 0 0  Total GAD 7 Score 2 0 0 0  Anxiety Difficulty Not difficult at all Not difficult at all Not difficult at all Not difficult at all   Relevant past medical, surgical, family and social history  reviewed and updated as indicated. Interim medical history since our last visit reviewed. Allergies and medications reviewed and updated.  Review of Systems  Constitutional:  Negative for activity change, appetite change, diaphoresis, fatigue and fever.  HENT:  Negative for ear discharge and ear pain.   Respiratory:  Negative for cough, chest tightness, shortness of breath and wheezing.   Cardiovascular:  Negative for chest pain, palpitations and leg swelling.  Gastrointestinal: Negative.   Neurological: Negative.   Psychiatric/Behavioral: Negative.     Per HPI unless specifically indicated above     Objective:    BP 134/82 (BP Location: Left Arm, Patient Position: Standing;Sitting)   Pulse 62   Temp 97.7 F (36.5 C) (Oral)   Wt 148 lb 3.2 oz (67.2 kg)   SpO2 95%   BMI 23.92 kg/m   Wt Readings from Last 3 Encounters:  12/16/23 148 lb 3.2 oz (67.2 kg)  11/14/23 147 lb (66.7 kg)  11/09/23 147 lb 12.8 oz (67 kg)    Physical Exam Vitals and nursing note reviewed.  Constitutional:      General: She is awake. She is not in acute distress.    Appearance: She is well-developed and well-groomed. She is not ill-appearing or toxic-appearing.  HENT:     Head: Normocephalic.     Right Ear: Hearing normal.     Left Ear: Hearing normal.  Eyes:     General: Lids are normal.        Right eye: No discharge.        Left eye: No discharge.     Conjunctiva/sclera: Conjunctivae normal.     Pupils: Pupils are equal, round, and reactive to light.  Neck:     Thyroid : No thyromegaly.     Vascular: No carotid bruit.  Cardiovascular:     Rate and Rhythm: Normal rate and regular rhythm.     Heart sounds: Normal heart sounds. No murmur heard.    No gallop.  Pulmonary:     Effort: Pulmonary effort is normal. No accessory muscle usage or respiratory distress.     Breath sounds: Normal breath sounds.  Abdominal:     General: Bowel sounds are normal.     Palpations: Abdomen is soft.   Musculoskeletal:     Cervical back: Normal range of motion and neck supple.     Right lower leg: 1+ Edema present.     Left lower leg: 1+ Edema present.  Lymphadenopathy:     Cervical: No cervical adenopathy.  Skin:    General: Skin is warm and dry.     Comments: Scattered pale bruising to upper extremities.  Neurological:     Mental Status: She is alert and oriented to person, place, and time.     Deep Tendon Reflexes: Reflexes are  normal and symmetric.     Reflex Scores:      Brachioradialis reflexes are 2+ on the right side and 2+ on the left side.      Patellar reflexes are 2+ on the right side and 2+ on the left side. Psychiatric:        Attention and Perception: Attention normal.        Mood and Affect: Mood normal.        Speech: Speech normal.        Behavior: Behavior normal. Behavior is cooperative.        Thought Content: Thought content normal.    Results for orders placed or performed in visit on 11/09/23  Immunoglobulins, QN, A/E/G/M   Collection Time: 11/09/23  9:46 AM  Result Value Ref Range   IgG (Immunoglobin G), Serum 157 (L) 586 - 1,602 mg/dL   IgA 15 (L) 64 - 577 mg/dL   IgM (Immunoglobulin M), Srm <5 (L) 26 - 217 mg/dL   IgE (Immunoglobulin E), Serum <2 (L) 6 - 495 IU/mL  CMP (Cancer Center only)   Collection Time: 11/09/23  9:46 AM  Result Value Ref Range   Sodium 139 135 - 145 mmol/L   Potassium 4.5 3.5 - 5.1 mmol/L   Chloride 104 98 - 111 mmol/L   CO2 26 22 - 32 mmol/L   Glucose, Bld 103 (H) 70 - 99 mg/dL   BUN 11 8 - 23 mg/dL   Creatinine 8.96 (H) 9.55 - 1.00 mg/dL   Calcium  9.5 8.9 - 10.3 mg/dL   Total Protein 6.6 6.5 - 8.1 g/dL   Albumin 4.4 3.5 - 5.0 g/dL   AST 23 15 - 41 U/L   ALT 13 0 - 44 U/L   Alkaline Phosphatase 77 38 - 126 U/L   Total Bilirubin 1.5 (H) 0.0 - 1.2 mg/dL   GFR, Estimated 54 (L) >60 mL/min   Anion gap 9 5 - 15  Lactate dehydrogenase   Collection Time: 11/09/23  9:46 AM  Result Value Ref Range   LDH 164 98 - 192  U/L  CBC with Differential (Cancer Center Only)   Collection Time: 11/09/23  9:46 AM  Result Value Ref Range   WBC Count 32.5 (H) 4.0 - 10.5 K/uL   RBC 4.17 3.87 - 5.11 MIL/uL   Hemoglobin 13.5 12.0 - 15.0 g/dL   HCT 58.8 63.9 - 53.9 %   MCV 98.6 80.0 - 100.0 fL   MCH 32.4 26.0 - 34.0 pg   MCHC 32.8 30.0 - 36.0 g/dL   RDW 86.9 88.4 - 84.4 %   Platelet Count 123 (L) 150 - 400 K/uL   nRBC 0.0 0.0 - 0.2 %   Neutrophils Relative % 5 %   Neutro Abs 1.8 1.7 - 7.7 K/uL   Lymphocytes Relative 93 %   Lymphs Abs 30.0 (H) 0.7 - 4.0 K/uL   Monocytes Relative 1 %   Monocytes Absolute 0.5 0.1 - 1.0 K/uL   Eosinophils Relative 0 %   Eosinophils Absolute 0.1 0.0 - 0.5 K/uL   Basophils Relative 1 %   Basophils Absolute 0.2 (H) 0.0 - 0.1 K/uL   WBC Morphology See Note    RBC Morphology MORPHOLOGY UNREMARKABLE    Smear Review Normal platelet morphology    Immature Granulocytes 0 %   Abs Immature Granulocytes 0.06 0.00 - 0.07 K/uL      Assessment & Plan:   Problem List Items Addressed This Visit  Cardiovascular and Mediastinum   Senile purpura   Chronic.  Noted on exam, recommend gentle skin care and monitoring for wounds, if wounds present immediately alert provider.      Relevant Medications   atorvastatin  (LIPITOR) 10 MG tablet   benazepril  (LOTENSIN ) 40 MG tablet   Essential hypertension   Chronic, stable.  BP at goal for age in office today. Continue current medication regimen and adjust as needed.  Recommend she monitor BP at least a few mornings a week at home and document.  DASH diet at home.  LABS: up to date with oncology.  Could consider change to ARB in future due to her underlying COPD, has been on ACE for several years.         Relevant Medications   atorvastatin  (LIPITOR) 10 MG tablet   benazepril  (LOTENSIN ) 40 MG tablet     Respiratory   Centrilobular emphysema (HCC)   Chronic, stable.  Continue current medication regimen as ordered by pulmonary + continue use  of InCourage vest.   Recommend she avoid use of Benadryl  due to age >14 and BEERS criteria, utilize Claritin instead (sent this in for allergies) and Flonase .  Currently stable with pulmonary regimen.        Relevant Medications   loratadine (CLARITIN) 10 MG tablet     Hematopoietic and Hemostatic   Thrombocytopenia   With underlying CLL, followed by oncology.  Continue this collaboration. Recent labs and notes reviewed.         Other   Hypercholesterolemia   Chronic, ongoing.  Continue current medication regimen and adjust as needed.  Lipid panel next visit.       Relevant Medications   atorvastatin  (LIPITOR) 10 MG tablet   benazepril  (LOTENSIN ) 40 MG tablet   Elevated hemoglobin A1c measurement   6% on past labs, possibly related to Prednisone  in past.  Recheck today and monitor closely.  Focus on diet and regular exercise.      Relevant Orders   Bayer DCA Hb A1c Waived   Depression, recurrent   Chronic, stable with no SI/HI.  Continue Paxil  at this time and consider transition in future to Sertraline due to age >34 and anticholinergic effects with Paxil .  She self stopped Ativan  one year ago and reports doing well without it. Will monitor. UDS last 06/10/22 and contract on file - will recheck UDS if returns to benzo use.  Return in 6 months, has multiple specialty visits with CLL -- will span follow-up out.      Relevant Medications   PARoxetine  (PAXIL ) 30 MG tablet   CLL (chronic lymphocytic leukemia) (HCC) - Primary   Chronic, under treatment with oncology.  Recent note and labs reviewed.  Continue collaboration with oncology provider.  Discussed goals of care with patient.      Relevant Medications   loratadine (CLARITIN) 10 MG tablet   Anxiety   Refer to depression plan of care.      Relevant Medications   PARoxetine  (PAXIL ) 30 MG tablet   Other Visit Diagnoses       Flu vaccine need       Flu vaccine today   Relevant Orders   Flu vaccine HIGH DOSE PF(Fluzone   Trivalent) (Completed)        Follow up plan: Return in about 6 months (around 06/15/2024) for HTN/HLD, ANXIETY, CLL, COPD + needs med well with nurse please.

## 2023-12-16 NOTE — Assessment & Plan Note (Signed)
 Chronic, stable.  BP at goal for age in office today. Continue current medication regimen and adjust as needed.  Recommend she monitor BP at least a few mornings a week at home and document.  DASH diet at home.  LABS: up to date with oncology.  Could consider change to ARB in future due to her underlying COPD, has been on ACE for several years.

## 2023-12-16 NOTE — Assessment & Plan Note (Addendum)
Chronic, ongoing.  Continue current medication regimen and adjust as needed.  Lipid panel next visit.    

## 2023-12-16 NOTE — Assessment & Plan Note (Signed)
Chronic, under treatment with oncology.  Recent note and labs reviewed.  Continue collaboration with oncology provider.  Discussed goals of care with patient.

## 2023-12-16 NOTE — Assessment & Plan Note (Signed)
 Chronic, stable with no SI/HI.  Continue Paxil  at this time and consider transition in future to Sertraline due to age >73 and anticholinergic effects with Paxil .  She self stopped Ativan  one year ago and reports doing well without it. Will monitor. UDS last 06/10/22 and contract on file - will recheck UDS if returns to benzo use.  Return in 6 months, has multiple specialty visits with CLL -- will span follow-up out.

## 2023-12-16 NOTE — Assessment & Plan Note (Signed)
 6% on past labs, possibly related to Prednisone  in past.  Recheck today and monitor closely.  Focus on diet and regular exercise.

## 2023-12-16 NOTE — Assessment & Plan Note (Addendum)
 Chronic, stable.  Continue current medication regimen as ordered by pulmonary + continue use of InCourage vest.   Recommend she avoid use of Benadryl  due to age >35 and BEERS criteria, utilize Claritin instead (sent this in for allergies) and Flonase .  Currently stable with pulmonary regimen.

## 2023-12-16 NOTE — Addendum Note (Signed)
 Addended by: Desree Leap T on: 12/16/2023 02:42 PM   Modules accepted: Orders

## 2023-12-16 NOTE — Assessment & Plan Note (Signed)
 Refer to depression plan of care.

## 2023-12-16 NOTE — Telephone Encounter (Signed)
 Copied from CRM [id number]. Topic: Clinical - Medication Refill >> Dec 16, 2023 11:57 AM Rosaria BRAVO wrote: Medication: LORazepam  (ATIVAN ) 1 MG tablet  Has the patient contacted their pharmacy? Yes (Agent: If no, request that the patient contact the pharmacy for the refill. If patient does not wish to contact the pharmacy document the reason why and proceed with request.) (Agent: If yes, when and what did the pharmacy advise?)  This is the patient's preferred pharmacy:  [facility] [phone] [place], [place] - [address] [place] [address] Phone: [phone] Fax: [phone]  Is this the correct pharmacy for this prescription? Yes If no, delete pharmacy and type the correct one.   Has the prescription been filled recently? Yes  Is the patient out of the medication? Yes  Has the patient been seen for an appointment in the last year OR does the patient have an upcoming appointment? Yes  Can we respond through MyChart? Yes  Agent: Please be advised that Rx refills may take up to 3 business days. We ask that you follow-up with your pharmacy.

## 2023-12-20 NOTE — Telephone Encounter (Signed)
 Discussed with patient. She is aware this is ready for pickup.

## 2023-12-23 ENCOUNTER — Encounter: Payer: Self-pay | Admitting: Internal Medicine

## 2023-12-26 ENCOUNTER — Other Ambulatory Visit (HOSPITAL_COMMUNITY): Payer: Self-pay

## 2023-12-28 ENCOUNTER — Other Ambulatory Visit: Payer: Self-pay

## 2023-12-29 ENCOUNTER — Other Ambulatory Visit: Payer: Self-pay | Admitting: Internal Medicine

## 2023-12-29 ENCOUNTER — Other Ambulatory Visit (HOSPITAL_COMMUNITY): Payer: Self-pay

## 2023-12-29 ENCOUNTER — Telehealth: Payer: Self-pay | Admitting: Pharmacist

## 2023-12-29 ENCOUNTER — Other Ambulatory Visit: Payer: Self-pay

## 2023-12-29 DIAGNOSIS — C9110 Chronic lymphocytic leukemia of B-cell type not having achieved remission: Secondary | ICD-10-CM

## 2023-12-29 MED ORDER — ZANUBRUTINIB 160 MG PO TABS
80.0000 mg | ORAL_TABLET | Freq: Every day | ORAL | 1 refills | Status: DC
  Filled 2023-12-29 – 2024-01-02 (×2): qty 15, 30d supply, fill #0
  Filled 2024-01-27: qty 15, 30d supply, fill #1

## 2023-12-29 NOTE — Telephone Encounter (Signed)
 Brukinsa  changed formulation from capsules to tablets. [facility] sent in refill request requesting a prescription for tablets.  Prescription sent to pharmacy.   Attempted call to patient to let her know that medication would be changed to tablets and that she would have to half the tablet to get to her 80 mg dose.  Unable to reach patient, left voicemail.

## 2023-12-29 NOTE — Addendum Note (Signed)
 Addended by: RODGERS RENAEE SAILOR on: 12/29/2023 01:02 PM   Modules accepted: Orders

## 2023-12-30 ENCOUNTER — Telehealth: Payer: Self-pay | Admitting: Pharmacy Technician

## 2023-12-30 NOTE — Telephone Encounter (Signed)
 Oral Oncology Patient Advocate Encounter  Was successful in securing patient a $8000 grant from [facility] to provide copayment coverage for Brukinsa .  This will keep the out of pocket expense at $0.     [employer] ID: [id number]   The billing information is as follows and has been shared with [facility].    RxBin: [id number] PCN: [id number] Member ID: [id number] Group ID: [id number] Dates of Eligibility: 11/30/2023 through 11/28/2024  Fund:  Chronic Lymphocytic Leukemia  Berea Majkowski (Patty) Chet Burnet, CPhT  [facility], Zelda Salmon, [facility] - Oral Chemotherapy Patient Advocate Specialist III Phone: [phone]  Fax: [phone]

## 2024-01-02 ENCOUNTER — Other Ambulatory Visit: Payer: Self-pay

## 2024-01-02 NOTE — Telephone Encounter (Signed)
 Change from capsules to tablets reviewed with patient by specialty pharmacy staff.  No further action needed.

## 2024-01-02 NOTE — Progress Notes (Signed)
 Specialty Pharmacy Refill Coordination Note  Katherine [name] is a 83 y.o. female contacted today regarding refills of specialty medication(s) Zanubrutinib  (BRUKINSA )   Patient requested Delivery   Delivery date: 01/06/24   Verified address: [address] [place]; [employer]   Medication will be filled on: 01/05/24  Discussed Brukinsa  formulation change with patient and informed her that medication will now be a 160mg  tablet instead of an 80mg  capsule. Patient will split in half per rx directions. Offered patient rph counseling but she declined. Patient aware to call with any questions or concerns.

## 2024-01-04 ENCOUNTER — Other Ambulatory Visit: Payer: Self-pay

## 2024-01-10 ENCOUNTER — Other Ambulatory Visit: Payer: Self-pay

## 2024-01-10 ENCOUNTER — Telehealth: Payer: Self-pay | Admitting: *Deleted

## 2024-01-10 NOTE — Telephone Encounter (Signed)
 Patient left a voice mail on clinical line stating she is returning a phone call from the clinic re: starting a new oncology med. She is requesting a return phone call.

## 2024-01-10 NOTE — Progress Notes (Signed)
 Specialty Pharmacy Ongoing Clinical Assessment Note  Katherine [name] is a 83 y.o. female who is being followed by the specialty pharmacy service for RxSp Oncology   Patient's specialty medication(s) reviewed today: Zanubrutinib  (BRUKINSA )   Missed doses in the last 4 weeks: 0   Patient/Caregiver did not have any additional questions or concerns.   Therapeutic benefit summary: Patient is achieving benefit   Adverse events/side effects summary: Experienced adverse events/side effects (diarrhea, tolerable with imodium)   Patient's therapy is appropriate to: Continue    Goals Addressed             This Visit's Progress    Stabilization of disease   On track    Patient is on track. Patient will maintain adherence.  Patient's white count is improving as of office visit on 11/09/23         Follow up: 6 months  [facility] Specialty Pharmacist

## 2024-01-27 ENCOUNTER — Other Ambulatory Visit (HOSPITAL_COMMUNITY): Payer: Self-pay

## 2024-01-31 ENCOUNTER — Other Ambulatory Visit (HOSPITAL_COMMUNITY): Payer: Self-pay

## 2024-01-31 ENCOUNTER — Other Ambulatory Visit: Payer: Self-pay

## 2024-01-31 NOTE — Progress Notes (Signed)
 Specialty Pharmacy Refill Coordination Note  Katherine [name] is a 83 y.o. female contacted today regarding refills of specialty medication(s) Zanubrutinib  (BRUKINSA )   Patient requested Delivery   Delivery date: 02/03/24   Verified address: [address] [place]; [employer]   Medication will be filled on: 02/02/24

## 2024-02-02 ENCOUNTER — Other Ambulatory Visit: Payer: Self-pay

## 2024-02-14 ENCOUNTER — Ambulatory Visit (INDEPENDENT_AMBULATORY_CARE_PROVIDER_SITE_OTHER): Admitting: Emergency Medicine

## 2024-02-14 VITALS — Ht 66.0 in | Wt 145.0 lb

## 2024-02-14 DIAGNOSIS — Z780 Asymptomatic menopausal state: Secondary | ICD-10-CM

## 2024-02-14 DIAGNOSIS — Z0000 Encounter for general adult medical examination without abnormal findings: Secondary | ICD-10-CM

## 2024-02-14 DIAGNOSIS — Z1231 Encounter for screening mammogram for malignant neoplasm of breast: Secondary | ICD-10-CM

## 2024-02-14 NOTE — Patient Instructions (Signed)
 Katherine [name],  Thank you for taking the time for your Medicare Wellness Visit. I appreciate your continued commitment to your health goals. Please review the care plan we discussed, and feel free to reach out if I can assist you further.  Please note that Annual Wellness Visits do not include a physical exam. Some assessments may be limited, especially if the visit was conducted virtually. If needed, we may recommend an in-person follow-up with your provider.  Ongoing Care Seeing your primary care provider every 3 to 6 months helps us  monitor your health and provide consistent, personalized care.   Referrals If a referral was made during today's visit and you haven't received any updates within two weeks, please contact the referred provider directly to check on the status.  Recommended Screenings:  You may get a tetanus vaccine at your local pharmacy at your convenience.  Please call to schedule your mammogram and bone density scan:  [facility] Address: [address], [place], [place] Phone: [phone]  [facility] [address] [place], [place] [address] Phone: [phone]   Health Maintenance  Topic Date Due   Medicare Annual Wellness Visit  06/10/2023   Breast Cancer Screening  10/12/2023   COVID-19 Vaccine (7 - 2025-26 season) 10/24/2023   DTaP/Tdap/Td vaccine (3 - Td or Tdap) 06/12/2024*   Flu Shot  Completed   Osteoporosis screening with Bone Density Scan  Completed   Zoster (Shingles) Vaccine  Completed   Meningitis B Vaccine  Aged Out   Pneumococcal Vaccine for age over 61  Discontinued   Colon Cancer Screening  Discontinued   Hepatitis C Screening  Discontinued  *Topic was postponed. The date shown is not the original due date.       02/14/2024    4:21 PM  Advanced Directives  Does Patient Have a Medical Advance Directive? Yes  Type of Estate Agent of  [place];Living will  Does patient want to make changes to medical advance directive? No - Patient declined  Copy of Healthcare Power of Attorney in Chart? No - copy requested    Vision: Annual vision screenings are recommended for early detection of glaucoma, cataracts, and diabetic retinopathy. These exams can also reveal signs of chronic conditions such as diabetes and high blood pressure.  Dental: Annual dental screenings help detect early signs of oral cancer, gum disease, and other conditions linked to overall health, including heart disease and diabetes.  Please see the attached documents for additional preventive care recommendations.

## 2024-02-14 NOTE — Progress Notes (Signed)
 "  Chief Complaint  Patient presents with   Medicare Wellness     Subjective:   Katherine [name] is a 83 y.o. female who presents for a Medicare Annual Wellness Visit.  Visit info / Clinical Intake: Medicare Wellness Visit Type:: Subsequent Annual Wellness Visit Persons participating in visit and providing information:: patient Medicare Wellness Visit Mode:: Telephone If telephone:: video declined Since this visit was completed virtually, some vitals may be partially provided or unavailable. Missing vitals are due to the limitations of the virtual format.: Documented vitals are patient reported If Telephone or Video please confirm:: I connected with patient using audio/video enable telemedicine. I verified patient identity with two identifiers, discussed telehealth limitations, and patient agreed to proceed. Patient Location:: home Provider Location:: clinic office Interpreter Needed?: No Pre-visit prep was completed: yes AWV questionnaire completed by patient prior to visit?: no Living arrangements:: lives with spouse/significant other Patient's Overall Health Status Rating: good Typical amount of pain: none Does pain affect daily life?: no Are you currently prescribed opioids?: no  Dietary Habits and Nutritional Risks How many meals a day?: 3 Eats fruit and vegetables daily?: (!) no (not every day) Most meals are obtained by: preparing own meals In the last 2 weeks, have you had any of the following?: (!) nausea, vomiting, diarrhea (diarrhea) Diabetic:: no  Functional Status Activities of Daily Living (to include ambulation/medication): Independent Ambulation: Independent with device- listed below Home Assistive Devices/Equipment: [place]; Eyeglasses (uses once in a while) Medication Administration: Independent Home Management (perform basic housework or laundry): Independent Manage your own finances?: yes Primary transportation is: family / friends; driving Concerns about  vision?: no *vision screening is required for WTM* Concerns about hearing?: (!) yes Uses hearing aids?: no  Fall Screening Falls in the past year?: 0 Number of falls in past year: 0 Was there an injury with Fall?: 0 Fall Risk Category Calculator: 0 Patient Fall Risk Level: Low Fall Risk  Fall Risk Patient at Risk for Falls Due to: Impaired balance/gait Fall risk Follow up: Falls evaluation completed; Education provided; Falls prevention discussed  Home and Transportation Safety: All rugs have non-skid backing?: yes All stairs or steps have railings?: yes Grab bars in the bathtub or shower?: yes Have non-skid surface in bathtub or shower?: yes Good home lighting?: yes Regular seat belt use?: yes Hospital stays in the last year:: no  Cognitive Assessment Difficulty concentrating, remembering, or making decisions? : no (sometimes can't remember) Will 6CIT or Mini Cog be Completed: yes What year is it?: 0 points What month is it?: 0 points Give patient an address phrase to remember (5 components): [address] [place] About what time is it?: 0 points Count backwards from 20 to 1: 0 points Say the months of the year in reverse: 0 points Repeat the address phrase from earlier: 0 points 6 CIT Score: 0 points  Advance Directives (For Healthcare) Does Patient Have a Medical Advance Directive?: Yes Does patient want to make changes to medical advance directive?: No - Patient declined Type of Advance Directive: Healthcare Power of [place]; Living will Copy of Healthcare Power of Attorney in Chart?: No - copy requested Copy of Living Will in Chart?: No - copy requested Would patient like information on creating a medical advance directive?: No - Patient declined  Reviewed/Updated  Reviewed/Updated: Reviewed All (Medical, Surgical, Family, Medications, Allergies, Care Teams, Patient Goals)    Allergies (verified) Augmentin [amoxicillin-pot clavulanate] and Biaxin  [clarithromycin]   Current Medications (verified) Outpatient Encounter Medications  as of 02/14/2024  Medication Sig   acetaminophen  (TYLENOL ) 325 MG tablet Take by mouth.   albuterol  (VENTOLIN  HFA) 108 (90 Base) MCG/ACT inhaler TAKE 2 PUFFS BY MOUTH EVERY 6 HOURS AS NEEDED FOR WHEEZE OR SHORTNESS OF BREATH   atorvastatin  (LIPITOR) 10 MG tablet Take 1 tablet (10 mg total) by mouth daily.   benazepril  (LOTENSIN ) 40 MG tablet Take 1 tablet (40 mg total) by mouth daily.   budesonide-glycopyrrolate-formoterol  (BREZTRI  AEROSPHERE) 160-9-4.8 MCG/ACT AERO inhaler Inhale 2 puffs into the lungs in the morning and at bedtime.   cholecalciferol (VITAMIN D3) 25 MCG (1000 UNIT) tablet Take 1,000 Units by mouth daily.   Cyanocobalamin (VITAMIN B-12) 5000 MCG TBDP Take 1 tablet by mouth daily at 2 PM.   diphenoxylate -atropine  (LOMOTIL ) 2.5-0.025 MG tablet Take 1 tablet by mouth 4 (four) times daily as needed for diarrhea or loose stools. Take it along with immodium   hydrocortisone  (ANUSOL -HC) 25 MG suppository Short term use as needed.  Home med.   levocetirizine (XYZAL ) 5 MG tablet Take 5 mg by mouth every evening. Alternates with Claritin    loratadine  (CLARITIN ) 10 MG tablet Take 1 tablet (10 mg total) by mouth daily. (Patient taking differently: Take 10 mg by mouth daily. Alternates with Xyzal )   LORazepam  (ATIVAN ) 0.5 MG tablet Take 1 tablet (0.5 mg total) by mouth 2 (two) times daily as needed for anxiety.   mometasone  (NASONEX ) 50 MCG/ACT nasal spray Place 2 sprays into the nose 2 (two) times daily.   Multiple Vitamins-Minerals (MULTIVITAMIN WITH MINERALS) tablet Take 1 tablet by mouth daily.   PARoxetine  (PAXIL ) 30 MG tablet Take 1 tablet (30 mg total) by mouth daily.   zanubrutinib  (BRUKINSA ) 160 MG tablet Take 0.5 tablets (80 mg total) by mouth daily.   fluticasone  (FLONASE ) 50 MCG/ACT nasal spray Place 2 sprays into both nostrils daily. (Patient not taking: Reported on 02/14/2024)   No  facility-administered encounter medications on file as of 02/14/2024.    History: Past Medical History:  Diagnosis Date   Allergy    Anxiety    Depression    GERD (gastroesophageal reflux disease)    Hemorrhoids    Hypertension    Leukemia, lymphoid (HCC)    CLL   Lobar pneumonia    Osteopenia    Personal history of chemotherapy    Past Surgical History:  Procedure Laterality Date   ABDOMINAL HYSTERECTOMY     APPENDECTOMY     BREAST BIOPSY Left    bx x 3-neg   COLON SURGERY     sigmoid resection   COLONOSCOPY     2007, 2012   COLONOSCOPY WITH PROPOFOL  N/A 09/17/2015   Procedure: COLONOSCOPY WITH PROPOFOL ;  Surgeon: Reyes LELON Cota, MD;  Location: [facility] ENDOSCOPY;  Service: Endoscopy;  Laterality: N/A;   OOPHORECTOMY     SPINE SURGERY     L4-5   Family History  Problem Relation Age of Onset   Osteoporosis Mother    Hypertension Father    Heart attack Father    Stroke Maternal Grandfather    Breast cancer Sister 51   Social History   Occupational History   Not on file  Tobacco Use   Smoking status: Former    Current packs/day: 0.00    Average packs/day: 1 pack/day for 35.0 years (35.0 ttl pk-yrs)    Types: Cigarettes    Start date: 02/22/1986    Quit date: 02/22/2021    Years since quitting: 2.9   Smokeless tobacco: Never  Vaping  Use   Vaping status: Never Used  Substance and Sexual Activity   Alcohol use: No    Alcohol/week: 0.0 standard drinks of alcohol   Drug use: No   Sexual activity: Not on file   Tobacco Counseling Counseling given: Not Answered  SDOH Screenings   Food Insecurity: No Food Insecurity (02/14/2024)  Housing: Low Risk (02/14/2024)  Transportation Needs: No Transportation Needs (02/14/2024)  Utilities: Not At Risk (02/14/2024)  Alcohol Screen: Low Risk (06/10/2022)  Depression (PHQ2-9): Low Risk (02/14/2024)  Financial Resource Strain: Low Risk (06/10/2022)  Physical Activity: Insufficiently Active (02/14/2024)  Social  Connections: Moderately Integrated (02/14/2024)  Stress: No Stress Concern Present (02/14/2024)  Tobacco Use: Medium Risk (02/14/2024)  Health Literacy: Adequate Health Literacy (02/14/2024)   See flowsheets for full screening details  Depression Screen PHQ 2 & 9 Depression Scale- Over the past 2 weeks, how often have you been bothered by any of the following problems? Little interest or pleasure in doing things: 0 Feeling down, depressed, or hopeless (PHQ Adolescent also includes...irritable): 0 PHQ-2 Total Score: 0 Trouble falling or staying asleep, or sleeping too much: 1 Feeling tired or having little energy: 1 Poor appetite or overeating (PHQ Adolescent also includes...weight loss): 0 Feeling bad about yourself - or that you are a failure or have let yourself or your family down: 0 Trouble concentrating on things, such as reading the newspaper or watching television (PHQ Adolescent also includes...like school work): 0 Moving or speaking so slowly that other people could have noticed. Or the opposite - being so fidgety or restless that you have been moving around a lot more than usual: 0 Thoughts that you would be better off dead, or of hurting yourself in some way: 0 PHQ-9 Total Score: 2 If you checked off any problems, how difficult have these problems made it for you to do your work, take care of things at home, or get along with other people?: Not difficult at all  Depression Treatment Depression Interventions/Treatment : Currently on Treatment; PHQ2-9 Score <4 Follow-up Not Indicated     Goals Addressed               This Visit's Progress     Increase physical activity (pt-stated)               Objective:    Today's Vitals   02/14/24 1614  Weight: 145 lb (65.8 kg)  Height: 5' 6 (1.676 m)   Body mass index is 23.4 kg/m.  Hearing/Vision screen Hearing Screening - Comments:: Some hearing loss Vision Screening - Comments:: UTD @ [facility]  [place] Immunizations and Health Maintenance Health Maintenance  Topic Date Due   Mammogram  10/12/2023   COVID-19 Vaccine (7 - 2025-26 season) 10/24/2023   DTaP/Tdap/Td (3 - Td or Tdap) 06/12/2024 (Originally 11/17/2020)   Medicare Annual Wellness (AWV)  02/13/2025   Influenza Vaccine  Completed   Bone Density Scan  Completed   Zoster Vaccines- Shingrix  Completed   Meningococcal B Vaccine  Aged Out   Pneumococcal Vaccine: 50+ Years  Discontinued   Colonoscopy  Discontinued   Hepatitis C Screening  Discontinued        Assessment/Plan:  This is a routine wellness examination for Katherine [name].  Patient Care Team: Cannady, Jolene T, NP as PCP - General (Nurse Practitioner) Rennie Cindy SAUNDERS, MD as Consulting Physician (Oncology) Tamea Dedra CROME, MD as Consulting Physician (Pulmonary Disease) Cathlyn Seal, MD as Referring Physician (Dermatology) Pa, [facility])  I  have personally reviewed and noted the following in the patients chart:   Medical and social history Use of alcohol, tobacco or illicit drugs  Current medications and supplements including opioid prescriptions. Functional ability and status Nutritional status Physical activity Advanced directives List of other physicians Hospitalizations, surgeries, and ER visits in previous 12 months Vitals Screenings to include cognitive, depression, and falls Referrals and appointments  Orders Placed This Encounter  Procedures   MM 3D SCREENING MAMMOGRAM BILATERAL BREAST    Would like to schedule same visit as DEXA    Standing Status:   Future    Expected Date:   02/15/2024    Expiration Date:   02/13/2025    Reason for Exam (SYMPTOM  OR DIAGNOSIS REQUIRED):   screening for breast cancer    Preferred imaging location?:   [facility]   DG Bone Density    Standing Status:   Future    Expected Date:   02/15/2024    Expiration Date:   02/13/2025    Scheduling Instructions:     Would like to have  in the same visit as MMG    Reason for Exam (SYMPTOM  OR DIAGNOSIS REQUIRED):   postmenopausal estrogen deficiency    Preferred imaging location?:   [facility]   In addition, I have reviewed and discussed with patient certain preventive protocols, quality metrics, and best practice recommendations. A written personalized care plan for preventive services as well as general preventive health recommendations were provided to patient.   Vina Ned, CMA   02/14/2024   Return in 1 year (on 02/21/2025) for Medicare Annual Wellness Visit.  After Visit Summary: (Mail) Due to this being a telephonic visit, the after visit summary with patients personalized plan was offered to patient via mail   Nurse Notes:  Placed order for MMG and DEXA scan Needs Tdap (pharmacy) "

## 2024-02-24 ENCOUNTER — Other Ambulatory Visit: Payer: Self-pay

## 2024-02-24 ENCOUNTER — Other Ambulatory Visit: Payer: Self-pay | Admitting: Internal Medicine

## 2024-02-24 DIAGNOSIS — C9110 Chronic lymphocytic leukemia of B-cell type not having achieved remission: Secondary | ICD-10-CM

## 2024-02-24 MED ORDER — ZANUBRUTINIB 160 MG PO TABS
80.0000 mg | ORAL_TABLET | Freq: Every day | ORAL | 1 refills | Status: AC
  Filled 2024-02-24 – 2024-03-01 (×3): qty 15, 30d supply, fill #0
  Filled 2024-03-29: qty 15, 30d supply, fill #1

## 2024-02-28 ENCOUNTER — Other Ambulatory Visit: Payer: Self-pay

## 2024-02-29 ENCOUNTER — Other Ambulatory Visit (HOSPITAL_COMMUNITY): Payer: Self-pay

## 2024-02-29 ENCOUNTER — Encounter: Payer: Self-pay | Admitting: Internal Medicine

## 2024-03-01 ENCOUNTER — Encounter: Payer: Self-pay | Admitting: Pulmonary Disease

## 2024-03-01 ENCOUNTER — Ambulatory Visit: Admitting: Pulmonary Disease

## 2024-03-01 ENCOUNTER — Other Ambulatory Visit: Payer: Self-pay

## 2024-03-01 VITALS — BP 120/72 | HR 74 | Temp 97.9°F | Ht 66.0 in | Wt 149.8 lb

## 2024-03-01 DIAGNOSIS — J4489 Other specified chronic obstructive pulmonary disease: Secondary | ICD-10-CM

## 2024-03-01 DIAGNOSIS — Z87891 Personal history of nicotine dependence: Secondary | ICD-10-CM | POA: Diagnosis not present

## 2024-03-01 DIAGNOSIS — J479 Bronchiectasis, uncomplicated: Secondary | ICD-10-CM

## 2024-03-01 DIAGNOSIS — D803 Selective deficiency of immunoglobulin G [IgG] subclasses: Secondary | ICD-10-CM

## 2024-03-01 DIAGNOSIS — C9110 Chronic lymphocytic leukemia of B-cell type not having achieved remission: Secondary | ICD-10-CM | POA: Diagnosis not present

## 2024-03-01 DIAGNOSIS — R0989 Other specified symptoms and signs involving the circulatory and respiratory systems: Secondary | ICD-10-CM

## 2024-03-01 DIAGNOSIS — J449 Chronic obstructive pulmonary disease, unspecified: Secondary | ICD-10-CM

## 2024-03-01 MED ORDER — BREZTRI AEROSPHERE 160-9-4.8 MCG/ACT IN AERO: 2.0000 | INHALATION_SPRAY | Freq: Two times a day (BID) | RESPIRATORY_TRACT | Status: AC

## 2024-03-01 NOTE — Progress Notes (Signed)
 "  Subjective:    Patient ID: Katherine [name], female    DOB: [birth year], 84 y.o.   MRN: [id number]  Patient Care Team: Valerio Melanie DASEN, NP as PCP - General (Nurse Practitioner) Rennie Cindy SAUNDERS, MD as Consulting Physician (Oncology) Tamea Dedra CROME, MD as Consulting Physician (Pulmonary Disease) Cathlyn Seal, MD as Referring Physician (Dermatology) Pa, [facility])  Chief Complaint  Patient presents with   COPD    Was using Breztri  daily, until 02/16/2024. Occasional cough no wheezing. Has been using albuterol  twice a day.     BACKGROUND/INTERVAL:This is an 84 year old former smoker (quit January 2023) with 35-pack-year history of smoking and a history as noted below, who presents for follow-up on abnormal chest CT and bronchiectasis.  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  Abnormal CT chest due to mucous plugging and atelectasis which has cleared with chest vest therapy. Last visit here was on 14 November 2023 with no exacerbations since then.    HPI Discussed the use of AI scribe software for clinical note transcription with the patient, who gave verbal consent to proceed.  History of Present Illness   Katherine [name] is an 84 year old female with COPD and bronchiectasis who presents for follow-up on these issues.  She is accompanied by her daughter today.  She ran out of her Breztri  medication on Christmas day and has been receiving it in small quantities from the pharmacy, causing confusion. She is unsure about the status of her medication renewal and wants this issue resolved.  She may need to reapply to the assistance program.  She uses her vest for chest physiotherapy daily and is doing well with it.  Notes marked improvement on her airway clearance when she uses the vest.  She follows with Dr. Rennie for CLL.  She is on Brukinsa  for her CLL and tolerating it well.  This is being monitored by  Dr. Rennie.  She does not endorse any increase in cough or sputum production.  No hemoptysis.  No purulent sputum.  When she uses Breztri  she notes that this helps her with her COPD symptoms.       Review of Systems A 10 point review of systems was performed and it is as noted above otherwise negative.   Patient Active Problem List   Diagnosis Date Noted   Dizziness 10/07/2023   Elevated hemoglobin A1c measurement 06/11/2023   Controlled substance agreement signed 12/09/2021   Type O blood, Rh negative 05/09/2021   Splenomegaly 02/02/2020   Thrombocytopenia 02/02/2020   Allergic rhinitis 07/18/2018   Goals of care, counseling/discussion 07/05/2018   History of prior cigarette smoking 05/01/2018   Centrilobular emphysema (HCC) 02/05/2018   Advanced care planning/counseling discussion 01/09/2018   Anxiety 01/04/2017   Hypercholesterolemia 01/08/2016   Atherosclerosis of aorta 01/07/2016   History of colonic polyps 08/14/2015   CLL (chronic lymphocytic leukemia) (HCC) 05/28/2015   Senile purpura 01/06/2015   Essential hypertension 01/06/2015   Depression, recurrent 01/06/2015    Social History   Tobacco Use   Smoking status: Former    Current packs/day: 0.00    Average packs/day: 1 pack/day for 35.0 years (35.0 ttl pk-yrs)    Types: Cigarettes    Start date: 02/22/1986    Quit date: 02/22/2021    Years since quitting: 3.0   Smokeless tobacco: Never  Substance Use Topics   Alcohol use: No    Alcohol/week: 0.0 standard drinks of alcohol  Allergies[1]  Active Medications[2]  Immunization History  Administered Date(s) Administered    sv, Bivalent, Protein Subunit Rsvpref,pf (Abrysvo) 02/12/2022   Fluad Quad(high Dose 65+) 11/08/2019, 11/12/2020, 12/09/2021   Fluad Trivalent(High Dose 65+) 11/23/2022   INFLUENZA, HIGH DOSE SEASONAL PF 01/04/2017, 11/07/2018, 11/16/2022, 12/16/2023   Influenza, Seasonal, Injecte, Preservative Fre 02/05/2003   Influenza,inj,Quad  PF,6+ Mos 01/06/2015, 11/21/2015, 10/07/2017   Influenza-Unspecified 01/06/2015, 11/21/2015, 01/04/2017, 10/07/2017, 11/07/2018, 12/03/2020   Moderna Covid-19 Fall Seasonal Vaccine 102yrs & older 01/30/2022, 07/29/2023   PFIZER(Purple Top)SARS-COV-2 Vaccination 05/11/2019, 06/01/2019, 02/05/2020, 12/07/2022   PNEUMOCOCCAL CONJUGATE-20 05/06/2023   Pneumococcal Conjugate-13 03/06/2014   Pneumococcal-Unspecified 02/22/1993, 02/23/2003   Td 07/24/2003   Tdap 11/18/2010   Zoster Recombinant(Shingrix) 08/02/2017, 02/04/2018, 05/27/2022   Zoster, Live 11/03/2005, 02/04/2018        Objective:     Vitals:   03/01/24 0843  BP: 120/72  Pulse: 74  Temp: 97.9 F (36.6 C)  Height: 5' 6 (1.676 m)  Weight: 149 lb 12.8 oz (67.9 kg)  SpO2: 96%  TempSrc: Temporal  BMI (Calculated): 24.19     SpO2: 96 %  GENERAL: Well-developed, thin woman, well-groomed, spry, no acute distress, fully ambulatory.  No conversational dyspnea. HEAD: Normocephalic, atraumatic.  EYES: Pupils equal, round, reactive to light.  No scleral icterus.  MOUTH: Oral mucosa moist.  Dentition intact. NECK: Supple. No thyromegaly. Trachea midline. No JVD.  No adenopathy. PULMONARY: Good air entry bilaterally.  No adventitious sounds. CARDIOVASCULAR: S1 and S2. Regular rate and rhythm.  No rubs, murmurs or gallops heard. ABDOMEN: Benign. MUSCULOSKELETAL: No joint deformity, no clubbing, 1+ edema of the feet/ankles noted, this is chronic. NEUROLOGIC: No focal deficit noted, no gait disturbance, speech is fluent. SKIN: Intact,warm,dry.  Multiple ecchymoses/senile purpura particularly upper extremities. PSYCH: Mood and behavior normal.        Assessment & Plan:     ICD-10-CM   1. Stage 2 moderate COPD by GOLD classification (HCC)  J44.9     2. Bronchiectasis without complication (HCC)  J47.9     3. CLL (chronic lymphocytic leukemia) (HCC)  C91.10     4. IgG deficiency (HCC) -secondary  D80.3     5. Abnormal  finding of lung  R09.89      Meds ordered this encounter  Medications   budesonide-glycopyrrolate-formoterol  (BREZTRI  AEROSPHERE) 160-9-4.8 MCG/ACT AERO inhaler    Sig: Inhale 2 puffs into the lungs in the morning and at bedtime.   Discussion:    COPD with chronic asthmatic bronchitis Chronic obstructive pulmonary disease with chronic asthmatic bronchitis. Reports running out of Breztri  on Christmas day and currently out of it. Using vest daily. Lung sounds clear on examination. - Provided samples of Breztri  - Checked with pharmacy team regarding Breztri  renewal - Scheduled follow-up in four months      Advised if symptoms do not improve or worsen, to please contact office for sooner follow up or seek emergency care.    I spent 30 minutes of dedicated to the care of this patient on the date of this encounter to include pre-visit review of records, face-to-face time with the patient discussing conditions above, post visit ordering of testing, clinical documentation with the electronic health record, making appropriate referrals as documented, and communicating necessary findings to members of the patients care team.     C. Leita Sanders, MD Advanced Bronchoscopy PCCM [facility]    *This note was generated using voice recognition software/Dragon and/or AI transcription program.  Despite best efforts to proofread, errors can  occur which can change the meaning. Any transcriptional errors that result from this process are unintentional and may not be fully corrected at the time of dictation.     [1]  Allergies Allergen Reactions   Augmentin [Amoxicillin-Pot Clavulanate] Swelling    tongue   Biaxin [Clarithromycin] Swelling and Rash    tongue  [2]  Current Meds  Medication Sig   acetaminophen  (TYLENOL ) 325 MG tablet Take by mouth.   albuterol  (VENTOLIN  HFA) 108 (90 Base) MCG/ACT inhaler TAKE 2 PUFFS BY MOUTH EVERY 6 HOURS AS NEEDED FOR WHEEZE OR SHORTNESS OF  BREATH   atorvastatin  (LIPITOR) 10 MG tablet Take 1 tablet (10 mg total) by mouth daily.   benazepril  (LOTENSIN ) 40 MG tablet Take 1 tablet (40 mg total) by mouth daily.   cholecalciferol (VITAMIN D3) 25 MCG (1000 UNIT) tablet Take 1,000 Units by mouth daily.   Cyanocobalamin (VITAMIN B-12) 5000 MCG TBDP Take 1 tablet by mouth daily at 2 PM.   diphenoxylate -atropine  (LOMOTIL ) 2.5-0.025 MG tablet Take 1 tablet by mouth 4 (four) times daily as needed for diarrhea or loose stools. Take it along with immodium   fluticasone  (FLONASE ) 50 MCG/ACT nasal spray Place 2 sprays into both nostrils daily.   hydrocortisone  (ANUSOL -HC) 25 MG suppository Short term use as needed.  Home med.   levocetirizine (XYZAL ) 5 MG tablet Take 5 mg by mouth every evening. Alternates with Claritin    loratadine  (CLARITIN ) 10 MG tablet Take 1 tablet (10 mg total) by mouth daily. (Patient taking differently: Take 10 mg by mouth daily. Alternates with Xyzal )   LORazepam  (ATIVAN ) 0.5 MG tablet Take 1 tablet (0.5 mg total) by mouth 2 (two) times daily as needed for anxiety.   mometasone  (NASONEX ) 50 MCG/ACT nasal spray Place 2 sprays into the nose 2 (two) times daily.   Multiple Vitamins-Minerals (MULTIVITAMIN WITH MINERALS) tablet Take 1 tablet by mouth daily.   PARoxetine  (PAXIL ) 30 MG tablet Take 1 tablet (30 mg total) by mouth daily.   zanubrutinib  (BRUKINSA ) 160 MG tablet Take 0.5 tablets (80 mg total) by mouth daily.   "

## 2024-03-01 NOTE — Patient Instructions (Signed)
 VISIT SUMMARY:  Armetta Henri, an 84 year old female with COPD and chronic asthmatic bronchitis, visited for a follow-up on her medication management. She reported running out of her Breztri  medication and receiving it in small quantities from the pharmacy, causing confusion. She is using her vest daily and doing well with it. Her financial situation remains tight due to limited social security income.  YOUR PLAN:  -COPD WITH BRONCHIECTASIS: Chronic obstructive pulmonary disease (COPD) with bronchiectasis is a long-term condition that makes it hard to breathe due to inflamed and narrowed airways. You reported running out of Breztri  on Christmas day and receiving multiple prescriptions separately. We provided you with samples of Breztri  and checked with the pharmacy team regarding your medication renewal. Continue using your vest daily as it is helping you. We will follow up in four months to reassess your condition.  INSTRUCTIONS:  Please follow up in four months for reassessment of your condition. Continue using your vest daily and take the Breztri  samples as directed. We will check with the pharmacy team to ensure your medication renewal is resolved.

## 2024-03-02 ENCOUNTER — Other Ambulatory Visit: Payer: Self-pay

## 2024-03-05 ENCOUNTER — Other Ambulatory Visit: Payer: Self-pay

## 2024-03-05 NOTE — Progress Notes (Signed)
 Specialty Pharmacy Refill Coordination Note  Katherine [name] is a 84 y.o. female contacted today regarding refills of specialty medication(s) Zanubrutinib  (BRUKINSA )   Patient requested Delivery   Delivery date: 03/07/24   Verified address: [address] [place]   Medication will be filled on: 03/06/24

## 2024-03-06 ENCOUNTER — Other Ambulatory Visit: Payer: Self-pay

## 2024-03-14 ENCOUNTER — Inpatient Hospital Stay: Admitting: Internal Medicine

## 2024-03-14 ENCOUNTER — Encounter: Payer: Self-pay | Admitting: Internal Medicine

## 2024-03-14 ENCOUNTER — Inpatient Hospital Stay: Attending: Internal Medicine

## 2024-03-14 VITALS — BP 127/72 | HR 73 | Temp 96.5°F | Resp 18 | Ht 66.0 in | Wt 149.9 lb

## 2024-03-14 DIAGNOSIS — C9110 Chronic lymphocytic leukemia of B-cell type not having achieved remission: Secondary | ICD-10-CM | POA: Diagnosis not present

## 2024-03-14 LAB — CBC WITH DIFFERENTIAL (CANCER CENTER ONLY)
Abs Immature Granulocytes: 0.07 K/uL (ref 0.00–0.07)
Basophils Absolute: 0.1 K/uL (ref 0.0–0.1)
Basophils Relative: 0 %
Eosinophils Absolute: 0.2 K/uL (ref 0.0–0.5)
Eosinophils Relative: 1 %
HCT: 42.6 % (ref 36.0–46.0)
Hemoglobin: 14.1 g/dL (ref 12.0–15.0)
Immature Granulocytes: 0 %
Lymphocytes Relative: 87 %
Lymphs Abs: 19.7 K/uL — ABNORMAL HIGH (ref 0.7–4.0)
MCH: 32.6 pg (ref 26.0–34.0)
MCHC: 33.1 g/dL (ref 30.0–36.0)
MCV: 98.4 fL (ref 80.0–100.0)
Monocytes Absolute: 0.5 K/uL (ref 0.1–1.0)
Monocytes Relative: 2 %
Neutro Abs: 2.3 K/uL (ref 1.7–7.7)
Neutrophils Relative %: 10 %
Platelet Count: 137 K/uL — ABNORMAL LOW (ref 150–400)
RBC: 4.33 MIL/uL (ref 3.87–5.11)
RDW: 13.5 % (ref 11.5–15.5)
WBC Count: 22.7 K/uL — ABNORMAL HIGH (ref 4.0–10.5)
nRBC: 0 % (ref 0.0–0.2)

## 2024-03-14 LAB — CMP (CANCER CENTER ONLY)
ALT: 13 U/L (ref 0–44)
AST: 22 U/L (ref 15–41)
Albumin: 4.9 g/dL (ref 3.5–5.0)
Alkaline Phosphatase: 91 U/L (ref 38–126)
Anion gap: 11 (ref 5–15)
BUN: 15 mg/dL (ref 8–23)
CO2: 28 mmol/L (ref 22–32)
Calcium: 10.1 mg/dL (ref 8.9–10.3)
Chloride: 104 mmol/L (ref 98–111)
Creatinine: 0.92 mg/dL (ref 0.44–1.00)
GFR, Estimated: >60 mL/min
Glucose, Bld: 99 mg/dL (ref 70–99)
Potassium: 4.2 mmol/L (ref 3.5–5.1)
Sodium: 142 mmol/L (ref 135–145)
Total Bilirubin: 1.1 mg/dL (ref 0.0–1.2)
Total Protein: 6.7 g/dL (ref 6.5–8.1)

## 2024-03-14 LAB — LACTATE DEHYDROGENASE: LDH: 194 U/L (ref 105–235)

## 2024-03-14 NOTE — Progress Notes (Signed)
 Pt asking if she may need something to help her sleep? Tylenol  doesn't help and she tosses and turns all night.

## 2024-03-14 NOTE — Assessment & Plan Note (Addendum)
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 # AUG 21st, 2024- CT chest [Dr.Gonzalez]- Partially visualized splenomegaly, increased-again suggestive of progressive CLL.  Spleen up to umbilicus. Currently on Zanubrutinib  80 mg [half pill of 160mg - Pharmac] In AM [for last 3 weeks- SEP mid 2024].   # Patient tolerating Zanu 80 mg a day fairly well-monitor closely in the context of recent GI bleed see below.  White count 32 -improving thrombocytopenia improved- Stable.   # Anemia multifactorial CLL/GI bleed.  No hemolysis. Continue  gentle iron [iron biglycinate; 28 mg ] 1 pill a day-today hemoglobin is 13 improved from a baseline of 8. Stable.   # Bil Leg cramps/insomnia- Magnesium L-threonate is often used to improve sleep quality- stable.   # Bil LE swelling: sec to Zanubrutinib - G-1-2;continue Stocking/ leg elevation-  Stable  # Diarrhea grade 1 secondary to zabu continue in addition to Imodium recommend Lomotil . 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   Patient declines given concerns of diarrhea./Poor tolerance. Stable  #COPD/history of pneumonia -bilateral Jan 2023 [QUITsmoking-April, 2023; [facility]]-right middle lobe lung atelectasis [Dr.Gonzalez] - AUG 2024- CT- Improved aeration of the right middle lobe with residual mild plate like scarring versus atelectasis in the right middle lobe. Stable  # Easy brusing sec to zanurbitinib-stable.   # Vaccination:s/p flu shot; ok with  covid for now. Recommend shingles vaccination [remote] -   # DISPOSITION:  # Follow up in 4  months- MD: Labs- cbc/cmp; LDH;quantitive immunoglobulin- Dr.B

## 2024-03-14 NOTE — Progress Notes (Signed)
 "  [facility] CONSULT NOTE  Patient Care Team: Valerio Melanie DASEN, NP as PCP - General (Nurse Practitioner) Rennie Cindy SAUNDERS, MD as Consulting Physician (Oncology) Tamea Dedra CROME, MD as Consulting Physician (Pulmonary Disease) Cathlyn Seal, MD as Referring Physician (Dermatology) Pa, [facility] (Optometry)  CHIEF COMPLAINTS/PURPOSE OF CONSULTATION:  CLL  #  Oncology History Overview Note  Katherine [name] is a 84 y.o. female with chronic lymphocytic leukemia (CLL).  WBC has ranged between 22,000 - 101,7000 since 05/2011.   She has received Rituxan  x 2 four week cycles (06/27/2015 and 05/27/2017).  Hepatitis B surface antigen and hepatitis B surface antibody were negative on 07/04/2015.   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  CCND1, ATM, chromosome 12, and TP53 were normal.     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  There was a CD5 and CD23 positive monoclonal B cell population with lambda light chain restriction, negative for FMC7 and CD38, representing  88% of leukocytes, >5,000/uL. There was no loss of, or aberrant expression of, the pan T cell  antigens to  suggest a neoplastic T cell process. CD4:CD8 ratio 2.0  No circulating blasts were detected. There was no immunophenotypic evidence of abnormal myeloid maturation.  IGH/BCL2 by FISH is pending. --------------------------------------------------------  She began ibrutinib  on 04/24/2019 (discontinued on 05/30/2019).                     She developed blood-streaked sputum (slight) then significant hematuria.                     She declined further ibrutinib . ---------------------------------------------------------------------------------            She began acalabrutinib  on 03/14/2020 (held on 05/13/2020 secondary to  diarrhea). --------------------------------------------------------------------------------------   She has a 35 pack year smoking history.  She is in the low dose chest CT program.  Low dose chest CT on 02/02/2018 revealed a new endobronchial lesion in the segmental bronchus to the medial segment of the right middle lobe. This may simply reflect an area of retained secretions, however, the possibility of an endobronchial neoplasm should be considered. Lung-RADS 4AS, suspicious.  Low dose chest CT on 07/05/2018 revealed Lung-RADS 2, benign appearance or behavior.  Prior right middle lobe endobronchial lesion/mucous plugging was no longer visualized.  New mucous plugging in the right lower lobe.  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  Accompanied by her  daughter.   Katherine [name] 84 y.o.  female CLL-13 q. Deletion currently on Brukinsa /Zanubrutinib  [started in sep 2024 ]; COPD- SCC of skin-is here for follow-up.  Discussed the use of AI scribe software for clinical note transcription with the patient, who gave verbal consent to proceed.  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  She remains on zanubrutinib  for CLL, recently transitioning from capsule to split tablet formulation, and continues to adhere to her regimen. Her recent laboratory results show a decrease in white blood cell count from the thirties to 22,  platelet count at 137, and hemoglobin at 14. She has not experienced new or recurrent infections, hospitalizations, or  pneumonias since her last visit. Her family reports her energy level as impressive.  She has persistently low immunoglobulin levels. She has been cautious to avoid exposure to illness, including missing family gatherings when sick contacts are present. She received her annual influenza vaccine and a tetanus booster last week, but has not received a recent COVID booster.  She describes ongoing insomnia, characterized by difficulty maintaining sleep throughout the night. She has not yet trialed melatonin or acetaminophen /diphenhydramine  for sleep. No other new symptoms or complaints were reported.       Review of Systems  Constitutional:  Positive for malaise/fatigue. Negative for chills, diaphoresis, fever and weight loss.  HENT:  Negative for nosebleeds and sore throat.   Eyes:  Negative for double vision.  Respiratory:  Positive for shortness of breath. Negative for cough, sputum production and wheezing.   Cardiovascular:  Negative for chest pain, palpitations, orthopnea and leg swelling.  Gastrointestinal:  Negative for abdominal pain, blood in stool, constipation, diarrhea, heartburn, melena, nausea and vomiting.  Genitourinary:  Negative for dysuria, frequency and urgency.  Musculoskeletal:  Positive for back pain. Negative for joint pain.  Skin: Negative.  Negative for itching and rash.  Neurological:  Negative for dizziness, tingling, focal weakness, weakness and headaches.  Endo/Heme/Allergies:  Does not bruise/bleed easily.  Psychiatric/Behavioral:  Negative for depression. The patient is not nervous/anxious and does not have insomnia.      MEDICAL HISTORY:  Past Medical History:  Diagnosis Date   Allergy    Anxiety    Depression    GERD (gastroesophageal reflux disease)    Hemorrhoids    Hypertension    Leukemia, lymphoid (HCC)    CLL   Lobar pneumonia    Osteopenia    Personal history of chemotherapy     SURGICAL HISTORY: Past Surgical History:  Procedure Laterality  Date   ABDOMINAL HYSTERECTOMY     APPENDECTOMY     BREAST BIOPSY Left    bx x 3-neg   COLON SURGERY     sigmoid resection   COLONOSCOPY     2007, 2012   COLONOSCOPY WITH PROPOFOL  N/A 09/17/2015   Procedure: COLONOSCOPY WITH PROPOFOL ;  Surgeon: Reyes LELON Cota, MD;  Location: [facility] ENDOSCOPY;  Service: Endoscopy;  Laterality: N/A;   OOPHORECTOMY     SPINE SURGERY     L4-5    SOCIAL HISTORY: Social History   Socioeconomic History   Marital status: Married    Spouse name: Tommy   Number of children: 3   Years of education: 12   Highest education level: 12th grade  Occupational History   Not on file  Tobacco Use   Smoking status: Former    Current packs/day: 0.00    Average packs/day: 1 pack/day for 35.0 years (35.0 ttl pk-yrs)    Types: Cigarettes    Start date: 02/22/1986    Quit date: 02/22/2021    Years since quitting: 3.0   Smokeless tobacco: Never  Vaping Use   Vaping status: Never Used  Substance and Sexual Activity   Alcohol use: No    Alcohol/week: 0.0 standard drinks of alcohol   Drug use: No   Sexual activity: Not on file  Other Topics Concern   Not on file  Social History Narrative   Not on file   Social Drivers of Health   Tobacco Use: Medium Risk (03/14/2024)   Patient History  Smoking Tobacco Use: Former    Smokeless Tobacco Use: Never    Passive Exposure: Not on file  Financial Resource Strain: Low Risk (06/10/2022)   Overall Financial Resource Strain (CARDIA)    Difficulty of Paying Living Expenses: Not hard at all  Food Insecurity: No Food Insecurity (02/14/2024)   Epic    Worried About Programme Researcher, Broadcasting/film/video in the Last Year: Never true    Ran Out of Food in the Last Year: Never true  Transportation Needs: No Transportation Needs (02/14/2024)   Epic    Lack of Transportation (Medical): No    Lack of Transportation (Non-Medical): No  Physical Activity: Insufficiently Active (02/14/2024)   Exercise Vital Sign    Days of Exercise per Week: 3  days    Minutes of Exercise per Session: 20 min  Stress: No Stress Concern Present (02/14/2024)   [employer] of Occupational Health - Occupational Stress Questionnaire    Feeling of Stress: Only a little  Social Connections: Moderately Integrated (02/14/2024)   Social Connection and Isolation Panel    Frequency of Communication with Friends and Family: More than three times a week    Frequency of Social Gatherings with Friends and Family: Twice a week    Attends Religious Services: More than 4 times per year    Active Member of [employer] or Organizations: No    Attends Banker Meetings: Never    Marital Status: Married  Catering Manager Violence: Not At Risk (02/14/2024)   Epic    Fear of Current or Ex-Partner: No    Emotionally Abused: No    Physically Abused: No    Sexually Abused: No  Depression (PHQ2-9): Low Risk (03/14/2024)   Depression (PHQ2-9)    PHQ-2 Score: 0  Alcohol Screen: Low Risk (06/10/2022)   Alcohol Screen    Last Alcohol Screening Score (AUDIT): 0  Housing: Low Risk (02/14/2024)   Epic    Unable to Pay for Housing in the Last Year: No    Number of Times Moved in the Last Year: 0    Homeless in the Last Year: No  Utilities: Not At Risk (02/14/2024)   Epic    Threatened with loss of utilities: No  Health Literacy: Adequate Health Literacy (02/14/2024)   B1300 Health Literacy    Frequency of need for help with medical instructions: Never    FAMILY HISTORY: Family History  Problem Relation Age of Onset   Osteoporosis Mother    Hypertension Father    Heart attack Father    Stroke Maternal Grandfather    Breast cancer Sister 82    ALLERGIES:  is allergic to augmentin [amoxicillin-pot clavulanate] and biaxin [clarithromycin].  MEDICATIONS:  Current Outpatient Medications  Medication Sig Dispense Refill   acetaminophen  (TYLENOL ) 325 MG tablet Take by mouth.     albuterol  (VENTOLIN  HFA) 108 (90 Base) MCG/ACT inhaler TAKE 2 PUFFS BY MOUTH  EVERY 6 HOURS AS NEEDED FOR WHEEZE OR SHORTNESS OF BREATH 18 each 11   atorvastatin  (LIPITOR) 10 MG tablet Take 1 tablet (10 mg total) by mouth daily. 90 tablet 4   benazepril  (LOTENSIN ) 40 MG tablet Take 1 tablet (40 mg total) by mouth daily. 90 tablet 4   budesonide-glycopyrrolate-formoterol  (BREZTRI  AEROSPHERE) 160-9-4.8 MCG/ACT AERO inhaler Inhale 2 puffs into the lungs in the morning and at bedtime.     cholecalciferol (VITAMIN D3) 25 MCG (1000 UNIT) tablet Take 1,000 Units by mouth daily.     Cyanocobalamin (VITAMIN B-12) 5000 MCG TBDP  Take 1 tablet by mouth daily at 2 PM.     diphenoxylate -atropine  (LOMOTIL ) 2.5-0.025 MG tablet Take 1 tablet by mouth 4 (four) times daily as needed for diarrhea or loose stools. Take it along with immodium 60 tablet 1   fluticasone  (FLONASE ) 50 MCG/ACT nasal spray Place 2 sprays into both nostrils daily. 16 g 6   hydrocortisone  (ANUSOL -HC) 25 MG suppository Short term use as needed.  Home med.     levocetirizine (XYZAL ) 5 MG tablet Take 5 mg by mouth every evening. Alternates with Claritin      loratadine  (CLARITIN ) 10 MG tablet Take 1 tablet (10 mg total) by mouth daily. 30 tablet 11   LORazepam  (ATIVAN ) 0.5 MG tablet Take 1 tablet (0.5 mg total) by mouth 2 (two) times daily as needed for anxiety. 60 tablet 2   mometasone  (NASONEX ) 50 MCG/ACT nasal spray Place 2 sprays into the nose 2 (two) times daily.     Multiple Vitamins-Minerals (MULTIVITAMIN WITH MINERALS) tablet Take 1 tablet by mouth daily.     PARoxetine  (PAXIL ) 30 MG tablet Take 1 tablet (30 mg total) by mouth daily. 90 tablet 4   zanubrutinib  (BRUKINSA ) 160 MG tablet Take 0.5 tablets (80 mg total) by mouth daily. 15 tablet 1   No current facility-administered medications for this visit.      SABRA  PHYSICAL EXAMINATION: ECOG PERFORMANCE STATUS: 1 - Symptomatic but completely ambulatory  Vitals:   03/14/24 1029  BP: 127/72  Pulse: 73  Resp: 18  Temp: (!) 96.5 F (35.8 C)  SpO2: 97%      Filed Weights   03/14/24 1029  Weight: 149 lb 14.4 oz (68 kg)    Spleen-up to umbilicus.  Physical Exam HENT:     Head: Normocephalic and atraumatic.     Mouth/Throat:     Pharynx: No oropharyngeal exudate.  Eyes:     Pupils: Pupils are equal, round, and reactive to light.  Cardiovascular:     Rate and Rhythm: Normal rate and regular rhythm.  Pulmonary:     Effort: No respiratory distress.     Breath sounds: No wheezing.     Comments: Decreased air entry bilaterally at the bases. Abdominal:     General: Bowel sounds are normal. There is no distension.     Palpations: Abdomen is soft. There is no mass.     Tenderness: There is no abdominal tenderness. There is no guarding or rebound.  Musculoskeletal:        General: No tenderness. Normal range of motion.     Cervical back: Normal range of motion and neck supple.  Skin:    General: Skin is warm.  Neurological:     Mental Status: She is alert and oriented to person, place, and time.  Psychiatric:        Mood and Affect: Affect normal.      LABORATORY DATA:  I have reviewed the data as listed Lab Results  Component Value Date   WBC 22.7 (H) 03/14/2024   HGB 14.1 03/14/2024   HCT 42.6 03/14/2024   MCV 98.4 03/14/2024   PLT 137 (L) 03/14/2024   Recent Labs    08/10/23 0942 10/17/23 1125 11/09/23 0946 03/14/24 1017  NA 141 143 139 142  K 3.8 4.2 4.5 4.2  CL 105 103 104 104  CO2 26 24 26 28   GLUCOSE 90 86 103* 99  BUN 12 13 11 15   CREATININE 0.78 1.01* 1.03* 0.92  CALCIUM  9.2 9.7 9.5 10.1  GFRNONAA >60  --  54* >60  PROT 6.5  --  6.6 6.7  ALBUMIN 4.5  --  4.4 4.9  AST 21  --  23 22  ALT 14  --  13 13  ALKPHOS 70  --  77 91  BILITOT 1.2  --  1.5* 1.1    RADIOGRAPHIC STUDIES: I have personally reviewed the radiological images as listed and agreed with the findings in the report. No results found.  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  Deletion-poor tolerance to Ibrutinib -hematuria; ; and Acalburtinib [stopped in March 2022 because of diarrhea]. # AUG 21st, 2024- CT chest [Dr.Gonzalez]- Partially visualized splenomegaly, increased-again suggestive of progressive CLL.  Spleen up to umbilicus. Currently on Zanubrutinib  80 mg [half pill of 160mg - Pharmac] In AM [for last 3 weeks- SEP mid 2024].   # Patient tolerating Zanu 80 mg a day fairly well-monitor closely in the context of recent GI bleed see below.  White count 32 -improving thrombocytopenia improved- Stable.   # Anemia multifactorial CLL/GI bleed.  No hemolysis. Continue  gentle iron [iron biglycinate; 28 mg ] 1 pill a day-today hemoglobin is 13 improved from a baseline of 8. Stable.   # Bil Leg cramps/insomnia- Magnesium L-threonate is often used to improve sleep quality- stable.   # Bil LE swelling: sec to Zanubrutinib - G-1-2;continue Stocking/ leg elevation-  Stable  # Diarrhea grade 1 secondary to zabu continue in addition to Imodium recommend Lomotil . 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   Patient declines given concerns of diarrhea./Poor tolerance. Stable  #COPD/history of pneumonia -bilateral Jan 2023 [QUITsmoking-April, 2023; [facility]]-right middle lobe lung atelectasis [Dr.Gonzalez] - AUG 2024- CT- Improved aeration of the right middle lobe with residual mild plate like scarring versus atelectasis in the right middle lobe. Stable  # Easy brusing sec to zanurbitinib-stable.   # Vaccination:s/p flu shot; ok with  covid for now. Recommend shingles vaccination [remote] -   # DISPOSITION:  # Follow up in 4  months- MD: Labs- cbc/cmp; LDH;quantitive immunoglobulin- Dr.B     All questions were answered. The patient knows to call the clinic with any problems, questions or concerns.   Cindy JONELLE Joe, MD 03/14/2024 11:22  AM     "

## 2024-03-16 ENCOUNTER — Ambulatory Visit: Payer: Self-pay | Admitting: Internal Medicine

## 2024-03-16 LAB — IMMUNOGLOBULINS A/E/G/M, SERUM
IgA: 18 mg/dL — ABNORMAL LOW (ref 64–422)
IgE (Immunoglobulin E), Serum: <2 [IU]/mL — ABNORMAL LOW (ref 6–495)
IgG (Immunoglobin G), Serum: 166 mg/dL — ABNORMAL LOW (ref 586–1602)
IgM (Immunoglobulin M), Srm: <5 mg/dL — ABNORMAL LOW (ref 26–217)

## 2024-03-29 ENCOUNTER — Other Ambulatory Visit: Payer: Self-pay

## 2024-04-04 ENCOUNTER — Ambulatory Visit

## 2024-06-13 ENCOUNTER — Ambulatory Visit: Admitting: Nurse Practitioner

## 2024-06-15 ENCOUNTER — Ambulatory Visit: Admitting: Nurse Practitioner

## 2024-06-29 ENCOUNTER — Ambulatory Visit: Admitting: Pulmonary Disease

## 2024-07-18 ENCOUNTER — Inpatient Hospital Stay: Admitting: Internal Medicine

## 2024-07-18 ENCOUNTER — Inpatient Hospital Stay

## 2025-02-21 ENCOUNTER — Ambulatory Visit
